# Patient Record
Sex: Female | Born: 1950
Health system: Southern US, Community
[De-identification: ages and names within clinical notes are randomized; demographics above are authoritative.]

## PROBLEM LIST (undated history)

## (undated) DIAGNOSIS — Z9981 Dependence on supplemental oxygen: Secondary | ICD-10-CM

## (undated) DIAGNOSIS — M7120 Synovial cyst of popliteal space [Baker], unspecified knee: Secondary | ICD-10-CM

## (undated) DIAGNOSIS — R319 Hematuria, unspecified: Principal | ICD-10-CM

## (undated) DIAGNOSIS — F419 Anxiety disorder, unspecified: Secondary | ICD-10-CM

## (undated) DIAGNOSIS — N939 Abnormal uterine and vaginal bleeding, unspecified: Secondary | ICD-10-CM

## (undated) DIAGNOSIS — I509 Heart failure, unspecified: Secondary | ICD-10-CM

## (undated) DIAGNOSIS — J449 Chronic obstructive pulmonary disease, unspecified: Secondary | ICD-10-CM

## (undated) DIAGNOSIS — I10 Essential (primary) hypertension: Secondary | ICD-10-CM

## (undated) DIAGNOSIS — R011 Cardiac murmur, unspecified: Secondary | ICD-10-CM

## (undated) DIAGNOSIS — J189 Pneumonia, unspecified organism: Secondary | ICD-10-CM

## (undated) DIAGNOSIS — R3915 Urgency of urination: Secondary | ICD-10-CM

## (undated) DIAGNOSIS — F17200 Nicotine dependence, unspecified, uncomplicated: Secondary | ICD-10-CM

## (undated) DIAGNOSIS — N952 Postmenopausal atrophic vaginitis: Secondary | ICD-10-CM

## (undated) HISTORY — PX: ABDOMINAL HYSTERECTOMY: SHX81

## (undated) HISTORY — DX: Postmenopausal atrophic vaginitis: N95.2

## (undated) HISTORY — DX: Hematuria, unspecified: R31.9

## (undated) HISTORY — DX: Abnormal uterine and vaginal bleeding, unspecified: N93.9

## (undated) HISTORY — DX: Nicotine dependence, unspecified, uncomplicated: F17.200

## (undated) HISTORY — DX: Urgency of urination: R39.15

## (undated) HISTORY — DX: Synovial cyst of popliteal space (Baker), unspecified knee: M71.20

---

## 2001-04-03 ENCOUNTER — Other Ambulatory Visit: Admission: RE | Admit: 2001-04-03 | Discharge: 2001-04-03 | Payer: Self-pay | Admitting: Obstetrics and Gynecology

## 2002-04-03 ENCOUNTER — Encounter: Payer: Self-pay | Admitting: Emergency Medicine

## 2002-04-03 ENCOUNTER — Inpatient Hospital Stay (HOSPITAL_COMMUNITY): Admission: EM | Admit: 2002-04-03 | Discharge: 2002-04-08 | Payer: Self-pay | Admitting: Emergency Medicine

## 2002-04-07 ENCOUNTER — Encounter: Payer: Self-pay | Admitting: Internal Medicine

## 2003-10-07 ENCOUNTER — Inpatient Hospital Stay (HOSPITAL_COMMUNITY): Admission: EM | Admit: 2003-10-07 | Discharge: 2003-10-15 | Payer: Self-pay | Admitting: Emergency Medicine

## 2003-10-07 ENCOUNTER — Encounter: Payer: Self-pay | Admitting: Emergency Medicine

## 2003-10-11 ENCOUNTER — Encounter: Payer: Self-pay | Admitting: Internal Medicine

## 2003-10-13 ENCOUNTER — Encounter: Payer: Self-pay | Admitting: Internal Medicine

## 2004-03-29 ENCOUNTER — Ambulatory Visit (HOSPITAL_COMMUNITY): Admission: RE | Admit: 2004-03-29 | Discharge: 2004-03-29 | Payer: Self-pay | Admitting: Internal Medicine

## 2004-03-30 ENCOUNTER — Encounter (HOSPITAL_COMMUNITY): Admission: RE | Admit: 2004-03-30 | Discharge: 2004-04-29 | Payer: Self-pay | Admitting: Internal Medicine

## 2004-04-13 ENCOUNTER — Ambulatory Visit (HOSPITAL_COMMUNITY): Admission: RE | Admit: 2004-04-13 | Discharge: 2004-04-13 | Payer: Self-pay | Admitting: Internal Medicine

## 2004-04-30 ENCOUNTER — Encounter (HOSPITAL_COMMUNITY): Admission: RE | Admit: 2004-04-30 | Discharge: 2004-05-30 | Payer: Self-pay | Admitting: Internal Medicine

## 2004-05-31 ENCOUNTER — Encounter (HOSPITAL_COMMUNITY): Admission: RE | Admit: 2004-05-31 | Discharge: 2004-06-30 | Payer: Self-pay | Admitting: Internal Medicine

## 2004-06-15 ENCOUNTER — Ambulatory Visit (HOSPITAL_COMMUNITY): Admission: RE | Admit: 2004-06-15 | Discharge: 2004-06-15 | Payer: Self-pay | Admitting: Neurology

## 2004-07-02 ENCOUNTER — Encounter (HOSPITAL_COMMUNITY): Admission: RE | Admit: 2004-07-02 | Discharge: 2004-08-01 | Payer: Self-pay | Admitting: Internal Medicine

## 2004-09-07 ENCOUNTER — Encounter (HOSPITAL_COMMUNITY): Admission: RE | Admit: 2004-09-07 | Discharge: 2004-09-24 | Payer: Self-pay | Admitting: Internal Medicine

## 2004-09-07 ENCOUNTER — Ambulatory Visit (HOSPITAL_COMMUNITY): Admission: RE | Admit: 2004-09-07 | Discharge: 2004-09-07 | Payer: Self-pay | Admitting: Internal Medicine

## 2004-10-11 ENCOUNTER — Encounter (HOSPITAL_COMMUNITY): Admission: RE | Admit: 2004-10-11 | Discharge: 2004-11-10 | Payer: Self-pay | Admitting: *Deleted

## 2004-10-19 ENCOUNTER — Ambulatory Visit (HOSPITAL_COMMUNITY): Admission: RE | Admit: 2004-10-19 | Discharge: 2004-10-19 | Payer: Self-pay | Admitting: Internal Medicine

## 2004-10-19 HISTORY — PX: COLONOSCOPY: SHX174

## 2004-11-17 ENCOUNTER — Encounter (HOSPITAL_COMMUNITY): Admission: RE | Admit: 2004-11-17 | Discharge: 2004-12-17 | Payer: Self-pay | Admitting: Internal Medicine

## 2005-05-09 ENCOUNTER — Ambulatory Visit (HOSPITAL_COMMUNITY): Admission: RE | Admit: 2005-05-09 | Discharge: 2005-05-09 | Payer: Self-pay | Admitting: Internal Medicine

## 2005-05-09 ENCOUNTER — Ambulatory Visit (HOSPITAL_COMMUNITY): Admission: RE | Admit: 2005-05-09 | Discharge: 2005-05-09 | Payer: Self-pay | Admitting: Obstetrics and Gynecology

## 2005-09-17 ENCOUNTER — Inpatient Hospital Stay (HOSPITAL_COMMUNITY): Admission: EM | Admit: 2005-09-17 | Discharge: 2005-09-22 | Payer: Self-pay | Admitting: Emergency Medicine

## 2005-10-25 ENCOUNTER — Ambulatory Visit (HOSPITAL_COMMUNITY): Admission: RE | Admit: 2005-10-25 | Discharge: 2005-10-25 | Payer: Self-pay | Admitting: Pulmonary Disease

## 2005-11-14 ENCOUNTER — Ambulatory Visit (HOSPITAL_COMMUNITY): Admission: RE | Admit: 2005-11-14 | Discharge: 2005-11-14 | Payer: Self-pay | Admitting: Family Medicine

## 2006-03-27 ENCOUNTER — Ambulatory Visit (HOSPITAL_COMMUNITY): Admission: RE | Admit: 2006-03-27 | Discharge: 2006-03-27 | Payer: Self-pay | Admitting: Internal Medicine

## 2006-05-11 ENCOUNTER — Ambulatory Visit (HOSPITAL_COMMUNITY): Admission: RE | Admit: 2006-05-11 | Discharge: 2006-05-11 | Payer: Self-pay | Admitting: Internal Medicine

## 2006-09-13 ENCOUNTER — Ambulatory Visit (HOSPITAL_COMMUNITY): Admission: RE | Admit: 2006-09-13 | Discharge: 2006-09-13 | Payer: Self-pay | Admitting: Internal Medicine

## 2007-09-05 ENCOUNTER — Ambulatory Visit (HOSPITAL_COMMUNITY): Admission: RE | Admit: 2007-09-05 | Discharge: 2007-09-05 | Payer: Self-pay | Admitting: Internal Medicine

## 2008-07-14 ENCOUNTER — Other Ambulatory Visit: Admission: RE | Admit: 2008-07-14 | Discharge: 2008-07-14 | Payer: Self-pay | Admitting: Obstetrics and Gynecology

## 2009-11-17 ENCOUNTER — Ambulatory Visit (HOSPITAL_COMMUNITY): Admission: RE | Admit: 2009-11-17 | Discharge: 2009-11-17 | Payer: Self-pay | Admitting: Internal Medicine

## 2011-01-16 ENCOUNTER — Encounter: Payer: Self-pay | Admitting: Neurology

## 2011-03-14 ENCOUNTER — Other Ambulatory Visit (HOSPITAL_COMMUNITY): Payer: Self-pay | Admitting: Internal Medicine

## 2011-03-14 DIAGNOSIS — Z139 Encounter for screening, unspecified: Secondary | ICD-10-CM

## 2011-03-15 ENCOUNTER — Ambulatory Visit (HOSPITAL_COMMUNITY): Payer: Self-pay

## 2011-05-10 ENCOUNTER — Other Ambulatory Visit (HOSPITAL_COMMUNITY): Payer: Self-pay | Admitting: Internal Medicine

## 2011-05-10 DIAGNOSIS — Z139 Encounter for screening, unspecified: Secondary | ICD-10-CM

## 2011-05-13 NOTE — H&P (Signed)
[name], [name]              ACCOUNT NO.:  [id number]   MEDICAL RECORD NO.:  [id number]          PATIENT TYPE:  INP   LOCATION:  IC07                          FACILITY:  [facility]   PHYSICIAN:  Angus G. Renard Matter, MD   DATE OF BIRTH:  07/17/1951   DATE OF ADMISSION:  09/17/2005  DATE OF DISCHARGE:  LH                                HISTORY & PHYSICAL   HISTORY OF PRESENT ILLNESS:  A 60 year old white female presented herself to  the emergency room with her chief complaint being shortness of breath for  several days duration.  The patient had been coughing some as well and had  some rhinorrhea, nausea, and vomiting, low-grade fever.  She was evaluated  by ED physician.  X-ray of her chest showed right lower lobe infiltrate,  compatible with pneumonia.   LABORATORY DATA:  The patient's blood gas is a pH of 7.08, PCO2 of 103, PO2  of 84.2.  Electrocardiogram shows normal sinus rhythm with a rate of 99, LVH  with ST abnormalities.  CBC:  WBC 24,700, hemoglobin 16.4, hematocrit 50.8.  Chemistries:  Sodium 135, potassium 4.5, chloride 90, CO2 of 37, glucose  117, BUN 24, creatinine 1.2, calcium 8.3.  Cardiac markers:  CPK-MB 1.8,  troponin-I of less than 0.05.  BNP 979.   The patient was started on BiPAP in the ED and was held in the ED pending  possible intubation.   SOCIAL HISTORY:  The patient has been a chronic cigarette smoker, does not  use alcohol.   FAMILY HISTORY:  Positive for coronary artery disease, hypertension.   PAST MEDICAL HISTORY:  1.  History of COPD.  2.  Previous history of pneumonia.  3.  Pneumothorax.  4.  Bronchitis.  5.  Congestive heart failure.   REVIEW OF SYSTEMS:  HEENT:  Negative.  CARDIOPULMONARY:  The patient has  been short of breath and has had cough for the past few days.  GASTROINTESTINAL:  Bouts of nausea and vomiting.  No diarrhea.  GENITOURINARY:  No dysuria or hematuria.   PHYSICAL EXAMINATION:  GENERAL:  Alert female.  VITAL SIGNS:  Blood  pressure 128/93, respirations 24, pulse 98.  HEENT:  Eyes:  PERRLA.  TM's negative.  Oropharynx benign.  NECK:  Supple, no JVD or thyroid abnormalities.  LUNGS:  Rhonchi over lower lung field, decreased air movement, and  diminished breath sounds.  Mild respiratory distress.  HEART:  Regular rhythm, no murmurs.  ABDOMEN:  No palpable organs or masses.  SKIN:  Warm and dry.  EXTREMITIES:  Free of edema.  NEUROLOGIC:  No focal deficit.   DIAGNOSES:  1.  Chronic obstructive pulmonary disease and exacerbation.  2.  Acute respiratory failure.  3.  Bronchopneumonia, acute.  4.  Hypercapnia.      Angus G. Renard Matter, MD  Electronically Signed     AGM/MEDQ  D:  09/17/2005  T:  09/18/2005  Job:  [id number]

## 2011-05-13 NOTE — H&P (Signed)
[name], [name]              ACCOUNT NO.:  [id number]   MEDICAL RECORD NO.:  [id number]          PATIENT TYPE:  INP   LOCATION:  IC07                          FACILITY:  [facility]   PHYSICIAN:  Angus G. Renard Matter, MD   DATE OF BIRTH:  [birth year]   DATE OF ADMISSION:  09/17/2005  DATE OF DISCHARGE:  LH                                HISTORY & PHYSICAL   ADDENDUM:   MEDICATIONS:  1.  Albuterol p.r.n.  2.  Xanax 0.5 mg q.i.d.   ALLERGIES:  No known drug allergies.      Angus G. Renard Matter, MD  Electronically Signed     AGM/MEDQ  D:  09/17/2005  T:  09/18/2005  Job:  [id number]

## 2011-05-13 NOTE — H&P (Signed)
[facility]  Patient:    [name], [name] Visit Number: [id number] MRN: [id number]          Service Type: MED Location: 2A A201 01 Attending Physician:  [name] Dictated by:   [name], M.D. Proc. Date: 04/03/02 Admit Date:  04/03/2002                           History and Physical  PHYSICIAN:  [name], M.D.  REASON FOR ADMISSION:  Pneumonia.  HISTORY: [name] is a 60 year old patient of Dr. Ouida Hanson, who said that she got sick about two to three days ago with headache, cough, fever, chills, and shortness of breath.  She got much worse today, and became so short of breath that she could not get from room to room in her house, called my office and I told her to come to the emergency room because she has a history of pneumonia in the past.  When she came into the emergency room she was noted to be markedly short of breath and appeared to be in some distress.  Her respiratory rate was in the 20s.  She had an O2 saturation of 78% on room air, and then had a temperature of 102 degrees.  She says that she has had multiple bouts of similar problems.  She is coughing but not able to cough anything up.  She has had no nausea or vomiting and not had any actual chills, but has had fever. She has had a severe headache.  PAST MEDICAL HISTORY:  1. Positive for problems with anxiety.  She has taken Xanax, Zoloft, and     estrogen.  She does not know the doses of any of these at this time.  2. She has had at least two episodes, she says, of pneumonia.  PAST SURGICAL HISTORY:  Hysterectomy.  SOCIAL HISTORY:  She had stopped smoking after her last episode of pneumonia, but then restarted about three months ago.  She does not drink any alcohol.  FAMILY HISTORY:  She says is positive for multiple family members with lung problems.  REVIEW OF SYSTEMS:  Except as mentioned, negative.  PHYSICAL EXAMINATION:  GENERAL:  Well-developed,  well-nourished, thin female who appears to be in moderate respiratory distress even now.  VITAL SIGNS:  On admission to the emergency room her temperature was 99.3 degrees, pulse 105, respirations 24, blood pressure 144/76.  O2 saturation 78%.  HEENT:  Mucous membranes were quite dry.  PERRL.  Tympanic membranes intact. Nose and throat clear, without any sinus tenderness.  CHEST:  Marked bilateral rhonchi.  She was using accessory muscles of respiration.  CARDIAC:  Heart sounds were very obscured by airway noise.  I really could not hear anything other than S1 and S2.  ABDOMEN:  Soft, without masses.  EXTREMITIES:  No edema.  CNS:  Grossly intact.  LABORATORY DATA:  Chest x-ray shows what appears to be a right lower lobe pneumonia and pleural effusion.  Her WBC is not particularly high at 8700, but her pO2 is 67, pCO2 is 66, pH is 7.36.  ASSESSMENT:  This is a 60 year old who has pneumonia, probably chronic obstructive pulmonary disease as well considering the elevated pCO2 with a fairly normal pH.  PLAN:  1. Admit.  2. Replace oxygen.  3. Give IV fluids.  4. Antibiotics.  5. Inhaled bronchodilators. Dictated by:   [name], M.D. Attending Physician:  [name] DD:  04/03/02 TD:  04/04/02 Job: [id number]

## 2011-05-13 NOTE — Group Therapy Note (Signed)
NAMEEPSIE, WALTHALL              ACCOUNT NO.:  [id number]   MEDICAL RECORD NO.:  [id number]          PATIENT TYPE:  INP   LOCATION:  A318                          FACILITY:  [facility]   PHYSICIAN:  Edward L. Juanetta Gosling, M.D.DATE OF BIRTH:  09-23-1951   DATE OF PROCEDURE:  DATE OF DISCHARGE:  09/22/2005                                   PROGRESS NOTE   Mrs. Lesko is doing much better. She is set for discharge today. She has no  other new complaints. Has a number of questions, and I have explained to her  that she needs to wear her oxygen at least 16 hours a day to get the benefit  of it.   Her exam shows that she is awake and alert. Temperature is 99.1, pulse 93,  respirations 20, blood pressure 142/75.  O2 sats 95% on 3 liters.  Her chest  shows minimal wheezes. Heart is regular.   ASSESSMENT:  She has chronic obstructive pulmonary disease with severe  exacerbation requiring intubation and mechanical ventilation. She is overall  much improved, and I think it is appropriate to send her home.      Edward L. Juanetta Gosling, M.D.  Electronically Signed     ELH/MEDQ  D:  09/22/2005  T:  09/22/2005  Job:  [id number]

## 2011-05-13 NOTE — Group Therapy Note (Signed)
NAMEISABELLAMARIE, RANDA              ACCOUNT NO.:  [id number]   MEDICAL RECORD NO.:  [id number]          PATIENT TYPE:  INP   LOCATION:  A218                          FACILITY:  [facility]   PHYSICIAN:  Edward L. Juanetta Gosling, M.D.DATE OF BIRTH:  [birth year]   DATE OF PROCEDURE:  DATE OF DISCHARGE:                                   PROGRESS NOTE   PROBLEM:  COPD with exacerbation. She is a patient of Dr. Ouida Sills.   SUBJECTIVE:  Ms. Sciarra says she is feeling better. She has no new complaints  and Dr. Ouida Sills plans to discharge her home. She says she is afraid that she  may fall.  I have asked for PT and see her.   PHYSICAL EXAMINATION:  VITAL SIGNS:  Temperature is 99.4, pulse 95,  respirations 19, blood pressure 122/74, O2 sat is 94% on 3 liters.  CHEST:  Clear.   ASSESSMENT:  She seems to be doing better. We will discuss with the case  manager whether she might need some home health services.  Continue her  other treatments and follow.      Edward L. Juanetta Gosling, M.D.  Electronically Signed     ELH/MEDQ  D:  09/21/2005  T:  09/21/2005  Job:  [id number]

## 2011-05-13 NOTE — Consult Note (Signed)
[name], RICKERSON              ACCOUNT NO.:  [id number]   MEDICAL RECORD NO.:  [id number]          PATIENT TYPE:  INP   LOCATION:  IC07                          FACILITY:  [facility]   PHYSICIAN:  Edward L. Juanetta Gosling, M.D.DATE OF BIRTH:  1951/05/09   DATE OF CONSULTATION:  09/19/2005  DATE OF DISCHARGE:                                   CONSULTATION   HISTORY OF PRESENT ILLNESS:  Ms. [name] is a 60 year old woman with COPD who  came to the emergency room on September 23 with a several day history of  shortness of breath.  When she was seen in the emergency room, she was noted  to have a right lower lobe infiltrate consistent with pneumonia.  Her blood  gas in the emergency room showed a pH of 7.08, PCO2 103, PO2 84.  She was  intubated, placed on mechanical ventilation.  This morning, she is sedated,  but seems to have generally improved.   PAST MEDICAL HISTORY:  1.  COPD.  2.  Pneumonia in the past.  3.  Pneumothorax in the past.  4.  Multiple bouts of bronchitis.  5.  Congestive heart failure.   SOCIAL HISTORY:  Many pack years of smoking.  Denied any use of alcohol.  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   FAMILY HISTORY:  Hypertension and heart disease, but I do not have all the  details of that.   MEDICATIONS:  I do not have a list of medications on admission.   REVIEW OF SYSTEMS:  Unobtainable.   PHYSICAL EXAMINATION:  GENERAL APPEARANCE:  She is sedated, intubated.  VITAL SIGNS:  Pulse 85, blood pressure 119/72, O2 saturation 92%,  respirations 12 on the ventilator.  HEENT:  Pupils equal, round and reactive to light and accommodation.  Nose  and throat are clear.  NECK:  Supple with no masses.  No jugular venous distention.  CHEST:  Fairly clear without wheezes, but she does have some rhonchi.  HEART:  Regular.  ABDOMEN:  Soft.  CNS:  She is sedated.  Really cannot make any other findings from that  except that she has been said to have no focal defect.   LABORATORY DATA:   B-met shows potassium 3, sodium 141, glucose 107, BUN 5,  creatinine 0.6.  CBC shows white count 14,800, hemoglobin 15.4, platelets  173,000.  Blood gas shows PO2 86, PCO2 39, pH 7.51.  Chest x-ray this  morning shows somewhat hyperinflated lungs, still with right lower lobe  pneumonia and perhaps effusion.  The endotracheal tube is in good position.   ASSESSMENT:  She is better.   PLAN:  See if she can wean today.  She needs to have potassium replacement,  and if she is unable to be weaned today, she will need to have a feeding  tube placed.  Thank you for allowing me to see her with you.      Edward L. Juanetta Gosling, M.D.  Electronically Signed     ELH/MEDQ  D:  09/19/2005  T:  09/19/2005  Job:  [id number]

## 2011-05-13 NOTE — Procedures (Signed)
[name], [name]              ACCOUNT NO.:  [id number]   MEDICAL RECORD NO.:  [id number]          PATIENT TYPE:  OUT   LOCATION:  RAD                           FACILITY:  [facility]   PHYSICIAN:  Edward L. Juanetta Gosling, M.D.DATE OF BIRTH:  1951/07/07   DATE OF PROCEDURE:  10/25/2005  DATE OF DISCHARGE:                              PULMONARY FUNCTION TEST   1.  Spirometry shows a severe ventilatory defect with airflow obstruction.  2.  Lung volumes show no evidence of restrictive change.  3.  DLCO is moderately reduced.  4.  Arterial blood gases show normal pH, elevated pCO2 and relative resting      hypoxemia. This is suggestive of chronic elevated CO2.  5.  There is no significant bronchodilator effect.  6.  This is consistent with a clinical diagnosis of emphysema.      Edward L. Juanetta Gosling, M.D.  Electronically Signed     ELH/MEDQ  D:  10/25/2005  T:  10/26/2005  Job:  [id number]   cc:   Kingsley Callander. Ouida Sills, MD  Fax: [phone]

## 2011-05-13 NOTE — Group Therapy Note (Signed)
[name], [name]              ACCOUNT NO.:  [id number]   MEDICAL RECORD NO.:  [id number]          PATIENT TYPE:  INP   LOCATION:  A218                          FACILITY:  [facility]   PHYSICIAN:  Edward L. Juanetta Gosling, M.D.DATE OF BIRTH:  1951/10/01   DATE OF PROCEDURE:  DATE OF DISCHARGE:                                   PROGRESS NOTE   PROBLEM:  Respiratory failure.   SUBJECTIVE:  [name] says she is doing much better.  She was able to be  extubated yesterday.  This morning she feels okay.  She has no new  complaints.  She is not short of breath.  She wants to get up.  She wants to  move out of the intensive care unit.  She is hoping to be able to eat.  Her  physical exam shows that she is alert.  She is oriented.  Her chest is much  clearer.  She has actually got decreased breath sounds.  No rhonchi now.  Her heart is regular without gallop.  Her abdomen is soft.  She does not  have any edema.  Her lab work shows pH 7.32, pCO2 of 66, pO2 of 74, which I  suspect is about baseline for her, perhaps a little higher CO2 than usual,  and her BMET shows a potassium of 3.8, sodium 138, chloride 101, CO2 31,  glucose 100, BUN 3, creatinine 0.5, calcium 7.5.  Her chest x-ray is  unchanged.   ASSESSMENT:  She is much improved.   PLAN:  To have her continue treatments.  Dr. Ouida Sills is planning to move her  from the intensive care unit, which I think is appropriate.      Edward L. Juanetta Gosling, M.D.  Electronically Signed     ELH/MEDQ  D:  09/20/2005  T:  09/20/2005  Job:  [id number]

## 2011-05-13 NOTE — Op Note (Signed)
NAME:  [name], [name]              ACCOUNT NO.:  [id number]   MEDICAL RECORD NO.:  [id number]          PATIENT TYPE:  AMB   LOCATION:  DAY                           FACILITY:  [facility]   PHYSICIAN:  R. Roetta Sessions, M.D. DATE OF BIRTH:  04-10-1951   DATE OF PROCEDURE:  10/19/2004  DATE OF DISCHARGE:                                 OPERATIVE REPORT   PROCEDURE:  Colonoscopy.   INDICATIONS FOR PROCEDURE:  The patient is a 60 year old lady sent over at  the courtesy of Dr. Carylon Perches for colorectal cancer screening. She has no  lower GI tract symptoms. No family history of colorectal neoplasia. She has  never had a screening colonoscopy or other imaging of her large bowel.  Colonoscopy is now being done as a screening maneuver. This approach has  been discussed with the patient at length. Potential risks, benefits, and  alternatives have been reviewed and questions answered. Please see my  handwritten HPI for more information.   PROCEDURE NOTE:  O2 saturation, blood pressure, pulse, and respirations  monitored throughout the entirety of the procedure. Conscious sedation with  Versed 5 mg IV and Demerol 125 mg IV in divided doses.   INSTRUMENT:  Olympus video chip system.   FINDINGS:  Digital rectal examination revealed no abnormalities.   ENDOSCOPIC FINDINGS:  Prep was good.   Rectum:  Examination of the rectal mucosa including retroflexed view of anal  verge revealed only internal hemorrhoids.   Colon:  Colonic mucosa was surveyed from the rectosigmoid junction through  the left, transverse, and right colon to area of the appendiceal orifice,  ileocecal valve, and cecum. These structures were well seen and photographed  for the record. Olympus video scope was slowly withdrawn, and all previously  mentioned mucosal surfaces were again seen. Colonic mucosa appeared normal.  The patient tolerated the procedure well and was reactive to endoscopy.   IMPRESSION:  Internal hemorrhoids,  otherwise normal rectum. Normal colon.   RECOMMENDATIONS:  Repeat colonoscopy for screening purposes in 10 years.     Otelia Sergeant   RMR/MEDQ  D:  10/19/2004  T:  10/19/2004  Job:  [id number]   cc:   Kingsley Callander. Ouida Sills, MD  [address]  [place]  [place] [address]  Fax: [phone]

## 2011-05-13 NOTE — Discharge Summary (Signed)
Jean, Hanson              ACCOUNT NO.:  [id number]   MEDICAL RECORD NO.:  [id number]          PATIENT TYPE:  INP   LOCATION:  A218                          FACILITY:  [facility]   PHYSICIAN:  Kingsley Callander. Ouida Sills, MD       DATE OF BIRTH:  02/12/1951   DATE OF ADMISSION:  09/17/2005  DATE OF DISCHARGE:  LH                                 DISCHARGE SUMMARY   DISCHARGE DIAGNOSES:  1.  Respiratory failure.  2.  Chronic obstructive pulmonary disease.  3.  Right lower lobe pneumonia.  4.  Hypokalemia.   HOSPITAL COURSE:  This patient is a 60 year old female who presented with  difficulty breathing. Her chest x-ray revealed a right lower lobe infiltrate  consistent with pneumonia. Her white count was 24.7. Her ABG initially  revealed a pH of 7.2, pCO2 103 and a pO2 of 84 with a bicarb of 39. She had  progressive difficulty breathing and required intubation. She was treated in  the intensive care unit with mechanical ventilation. She was treated with IV  Levaquin. She required sedation with Diprivan initially.   She gradually improved. She was able to be extubated on September 25. She  was seen in pulmonary consultation by Dr. Juanetta Gosling who facilitated her  extubation. She was continued on Levaquin. She was transferred to a regular  room and was able to ambulate satisfactorily. Her oxygenation was well  maintained with an O2 saturation of 94% on 3 L. She was felt to be stable  for discharge on September 27. She will be seen in followup in the office in  approximately 10 days. She will finish her course of oral Levaquin.   DISCHARGE MEDICATIONS:  1.  Levaquin 750 mg daily.  2.  DuoNeb q.i.d.  3.  Ativan 1 mg q.i.d. p.r.n.  4.  Oxygen 2 L by nasal cannula.      Kingsley Callander. Ouida Sills, MD  Electronically Signed     ROF/MEDQ  D:  09/21/2005  T:  09/21/2005  Job:  [id number]

## 2011-05-13 NOTE — Group Therapy Note (Signed)
[name], [name]              ACCOUNT NO.:  [id number]   MEDICAL RECORD NO.:  [id number]          PATIENT TYPE:  INP   LOCATION:  IC07                          FACILITY:  [facility]   PHYSICIAN:  Angus G. Renard Matter, MD   DATE OF BIRTH:  05-18-1951   DATE OF PROCEDURE:  09/18/2005  DATE OF DISCHARGE:                                   PROGRESS NOTE   SUBJECTIVE:  This patient was intubated in ED.  She presented there with  pneumonia, respiratory distress, hypercapnia.  She is currently on  ventilator settings of tidal volume 600, 40% oxygen.  The patient's  condition is stable.   OBJECTIVE:  VITAL SIGNS:  Temperature 102.6, blood pressure 142/94, pulse  rate 98, respirations 24.  LUNGS:  Rhonchi over the lower lung field.  HEART:  Regular rhythm.  ABDOMEN:  No palpable organs or masses.   ASSESSMENT:  The patient was admitted with pneumonia, respiratory distress  with hypercapnia.   PLAN:  Continue respiratory therapy today.  Continue IV antibiotics.  Continue albuterol nebulizer treatments.  The patient's current blood gas is  pH of 7.54, pCO2 41, pO2 65.9, bicarb 36.      Angus G. Renard Matter, MD  Electronically Signed     AGM/MEDQ  D:  09/18/2005  T:  09/19/2005  Job:  [id number]

## 2011-05-13 NOTE — Discharge Summary (Signed)
[facility]  Patient:    [name], [name] Visit Number: [id number] MRN: [id number]          Service Type: MED Location: 2A A201 01 Attending Physician:  Carylon Perches Dictated by:   Carylon Perches, M.D. Admit Date:  04/03/2002 Discharge Date: 04/08/2002                             Discharge Summary  DISCHARGE DIAGNOSES: 1. Pneumonia. 2. Chronic obstructive pulmonary disease. 3. Anxiety. 4. Postmenopausal.  HOSPITAL COURSE:  This patient is a 60 year old white female who presented with cough, fever, and shortness of breath.  The chest x-ray revealed a right lower lobe infiltrate and pleural effusion.  Her white count was 8.7.  ABGs revealed a pH of 7.32, a pCO2 of 62, and a pO2 of 65 on 2 L.  On 4 L, her ABG had revealed a pH of 7.42, a pCO2 of 56, and a pO2 of 74.  She was treated with Rocephin, Zithromax, albuterol and Atrovent nebulizer treatments, and supplemental oxygen.  She was wheezing initially.  Breath sounds were markedly diminished.  She was treated with IV Decadron which was modified to oral prednisone on April 07, 2002.  A repeat chest x-ray revealed improvement in the aeration of the right base.  A repeat ABG revealed a pH of 7.41, a pCO2 of 56, and a pO2 of 61 on 4 L.  A room air blood gas is pending.  She is being discharged on oxygen and home nebulizers.  She will be kept out of work until she follows up in the office in two weeks.  She has been encouraged to discontinue all tobacco use.  DISCHARGE MEDICATIONS: 1. Duoneb q.i.d. 2. Omnicef 300 mg b.i.d. for seven days. 3. Prednisone 10 mg ______ for four days. 4. Oxygen 2 L nasal cannula. 5. Premarin 0.625 mg q.d. 6. Zoloft 50 mg q.d. 7. Xanax 0.5 mg q.i.d. p.r.n. Dictated by:   Carylon Perches, M.D. Attending Physician:  Carylon Perches DD:  04/08/02 TD:  04/08/02 Job: [id number]

## 2011-05-13 NOTE — Discharge Summary (Signed)
[name], [name]                          ACCOUNT NO.:  [id number]   MEDICAL RECORD NO.:  [id number]                   PATIENT TYPE:  INP   LOCATION:  A327                                 FACILITY:  [facility]   PHYSICIAN:  Kingsley Callander. Ouida Sills, M.D.                  DATE OF BIRTH:  09-Jun-1951   DATE OF ADMISSION:  10/07/2003  DATE OF DISCHARGE:  10/15/2003                                 DISCHARGE SUMMARY   DISCHARGE DIAGNOSES:  1. Right lower lobe pneumonia (community-acquired).  2. Chronic obstructive pulmonary disease.  3. Right pleural effusion.  4. Anxiety.   PROCEDURES:  Ultrasound-guided thoracentesis.   HOSPITAL COURSE:  This patient is a 60 year old white female with COPD who  presented with increasing difficulty breathing with shortness of breath,  wheezing, and cough.  Her chest x-ray revealed a right lower lobe infiltrate  and right pleural effusion.  She was afebrile and had a white count of 8.6.  Her ABG revealed a pH of 7.37, pCO2 63, and a pO2 of 35 on room air.   She was hospitalized and treated with IV Levaquin, Solu-Medrol, oxygen, and  Ventolin and Atrovent nebulizer treatments.  Her wheezing was slow to  improve.  A repeat chest x-ray revealed a stable effusion.  She underwent an  ultrasound-guided thoracentesis, and 720 mL of clear yellow fluid was  withdrawn.  The white count on the pleural fluid was 78, with 7% neutrophils  and 27% lymphocytes and 64% monocytes, 2% eosinophils.  The glucose was 93,  the protein was 1.  The culture of the fluid has been negative.  Cytology  revealed no malignancy.   Blood cultures were negative.   IMPRESSION:  Her breathing improved following thoracentesis.  There was no  evidence of pneumothorax.  Her overall condition was improved to the point  that she was stable for discharge on October 15, 2003.  She will need to  continue home oxygen and home nebulizer treatments.   She will be seen in follow-up in my office in two  weeks.   DISCHARGE MEDICATIONS:  1. Oxygen 3 L by nasal cannula.  2. DuoNeb q.i.d.  3. Levaquin 500 mg daily for five days.  4. Prednisone 10 mg, 4 daily for four days; 3 daily for four days; 2 daily     for four days; 1 daily for four days; 1/2 daily for four days.  5.     Xanax 1 mg three times daily.  6. Premarin 0.625 mg daily.  7. Zoloft 50 mg daily.     ___________________________________________                                         Kingsley Callander. Ouida Sills, M.D.   ROF/MEDQ  D:  10/15/2003  T:  10/15/2003  Job:  [id number]  

## 2011-05-13 NOTE — H&P (Signed)
[name], [name]                          ACCOUNT NO.:  [id number]   MEDICAL RECORD NO.:  [id number]                   PATIENT TYPE:  INP   LOCATION:  A327                                 FACILITY:  [facility]   PHYSICIAN:  Kingsley Callander. Ouida Sills, M.D.                  DATE OF BIRTH:  Jan 14, 1951   DATE OF ADMISSION:  10/07/2003  DATE OF DISCHARGE:                                HISTORY & PHYSICAL   CHIEF COMPLAINT:  Difficulty breathing.   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  She had shortness of breath, coughing, and wheezing which was  worsening over the past week.  She coughed up minimal sputum.  She denied  fever.  She is a smoker.  She was last hospitalization in April 2003 with  pneumonia and COPD.  Arterial blood gases have previously been poor and she  had required oxygen.  She has unfortunately continued to smoke.   PAST MEDICAL HISTORY:  1. COPD.  2. Pneumonia.  3. Anxiety.  4. Postmenopausal.  5. Status post hysterectomy.   MEDICATIONS:  1. Zoloft 50 mg daily.  2. Xanax 0.5 q.i.d. p.r.n.  3. Premarin 0.625 mg daily.  4. Home nebulizers p.r.n.   ALLERGIES:  None.   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  She smokes a pack of cigarettes per day.  She does not abuse alcohol or recreational drugs.   FAMILY HISTORY:  She grew up in a foster home.   REVIEW OF SYSTEMS:  Noncontributory.   PHYSICAL EXAMINATION:  VITAL SIGNS:  Temperature 97.5, blood pressure  134/76, pulse 86, respirations 18, weight 140.  GENERAL:  Dyspneic white female.  HEENT:  She has a strabismus.  There is no scleral icterus.  The pharynx is  unremarkable.  NECK:  Supple with no JVD or thyromegaly.  LUNGS:  Distant breath sounds with bilateral wheezes.  HEART:  Regular with no murmurs.  ABDOMEN:  Nontender with no hepatosplenomegaly.  EXTREMITIES:  No cyanosis, clubbing, or edema.  NEUROLOGIC:  Grossly intact.   LABORATORY DATA:  White count 8.6, hemoglobin 15.1, platelets 255.  Sodium  135, potassium 3.6, glucose 130, BUN 11, creatinine 0.6, total protein 5.4,  albumin 2.6, ALT 30.  Urinalysis negative.  Chest x-ray reveals a right  lower lobe infiltrate with pleural effusion.   IMPRESSION:  Chronic obstructive pulmonary disease and pneumonia.  She will  require hospitalization for inpatient treatment with antibiotics, IV Solu-  Medrol, supplemental oxygen, and Ventolin and Atrovent nebulizer treatments.  Her initial ABG reveals a pH of 7.37, PCO2 63, and a PO2 of 35 on room air.     ___________________________________________  Kingsley Callander. Ouida Sills, M.D.   ROF/MEDQ  D:  10/09/2003  T:  10/09/2003  Job:  [id number]

## 2011-05-16 ENCOUNTER — Ambulatory Visit (HOSPITAL_COMMUNITY)
Admission: RE | Admit: 2011-05-16 | Discharge: 2011-05-16 | Disposition: A | Discharge status: Home / Self Care | Payer: Medicare Other | Source: Ambulatory Visit | Attending: Internal Medicine | Admitting: Internal Medicine

## 2011-05-16 DIAGNOSIS — Z1231 Encounter for screening mammogram for malignant neoplasm of breast: Secondary | ICD-10-CM | POA: Insufficient documentation

## 2011-05-16 DIAGNOSIS — Z139 Encounter for screening, unspecified: Secondary | ICD-10-CM

## 2011-08-17 ENCOUNTER — Inpatient Hospital Stay (HOSPITAL_COMMUNITY)
Admission: EM | Admit: 2011-08-17 | Discharge: 2011-08-20 | DRG: 192 | Disposition: A | Discharge status: Home / Self Care | Payer: Medicare Other | Attending: Internal Medicine | Admitting: Internal Medicine

## 2011-08-17 ENCOUNTER — Encounter: Payer: Self-pay | Admitting: Emergency Medicine

## 2011-08-17 ENCOUNTER — Emergency Department (HOSPITAL_COMMUNITY): Payer: Medicare Other

## 2011-08-17 DIAGNOSIS — R5381 Other malaise: Secondary | ICD-10-CM

## 2011-08-17 DIAGNOSIS — F411 Generalized anxiety disorder: Secondary | ICD-10-CM | POA: Diagnosis present

## 2011-08-17 DIAGNOSIS — J441 Chronic obstructive pulmonary disease with (acute) exacerbation: Secondary | ICD-10-CM

## 2011-08-17 DIAGNOSIS — E860 Dehydration: Secondary | ICD-10-CM

## 2011-08-17 DIAGNOSIS — R5383 Other fatigue: Secondary | ICD-10-CM

## 2011-08-17 HISTORY — DX: Chronic obstructive pulmonary disease, unspecified: J44.9

## 2011-08-17 HISTORY — DX: Anxiety disorder, unspecified: F41.9

## 2011-08-17 HISTORY — DX: Heart failure, unspecified: I50.9

## 2011-08-17 HISTORY — DX: Pneumonia, unspecified organism: J18.9

## 2011-08-17 LAB — BASIC METABOLIC PANEL
BUN: 8 mg/dL (ref 6–23)
CO2: 39 mEq/L — ABNORMAL HIGH (ref 19–32)
Glucose, Bld: 86 mg/dL (ref 70–99)
Sodium: 137 mEq/L (ref 135–145)

## 2011-08-17 LAB — URINALYSIS, ROUTINE W REFLEX MICROSCOPIC
Bilirubin Urine: NEGATIVE
Leukocytes, UA: NEGATIVE
Protein, ur: NEGATIVE mg/dL
Specific Gravity, Urine: 1.015 (ref 1.005–1.030)
Urobilinogen, UA: 0.2 mg/dL (ref 0.0–1.0)
pH: 6 (ref 5.0–8.0)

## 2011-08-17 LAB — DIFFERENTIAL
Basophils Relative: 0 % (ref 0–1)
Eosinophils Absolute: 0.1 10*3/uL (ref 0.0–0.7)
Lymphocytes Relative: 23 % (ref 12–46)
Lymphs Abs: 1.8 10*3/uL (ref 0.7–4.0)
Monocytes Relative: 14 % — ABNORMAL HIGH (ref 3–12)
Neutro Abs: 4.7 10*3/uL (ref 1.7–7.7)

## 2011-08-17 LAB — CBC
Hemoglobin: 13.9 g/dL (ref 12.0–15.0)
MCH: 30.5 pg (ref 26.0–34.0)
MCHC: 31.4 g/dL (ref 30.0–36.0)
MCV: 97.1 fL (ref 78.0–100.0)

## 2011-08-17 LAB — HEPATIC FUNCTION PANEL
Albumin: 3.7 g/dL (ref 3.5–5.2)
Alkaline Phosphatase: 67 U/L (ref 39–117)
Bilirubin, Direct: 0.1 mg/dL (ref 0.0–0.3)

## 2011-08-17 MED ORDER — GUAIFENESIN ER 600 MG PO TB12
1200.0000 mg | ORAL_TABLET | Freq: Two times a day (BID) | ORAL | Status: DC
  Administered 2011-08-17 – 2011-08-20 (×5): 1200 mg via ORAL
  Filled 2011-08-17: qty 1
  Filled 2011-08-17: qty 2
  Filled 2011-08-17: qty 1
  Filled 2011-08-17: qty 2
  Filled 2011-08-17: qty 1

## 2011-08-17 MED ORDER — POLYETHYLENE GLYCOL 3350 17 G PO PACK: 17.0000 g | PACK | Freq: Every day | ORAL | Status: DC

## 2011-08-17 MED ORDER — DEXTROSE 5 % IV SOLN
1.0000 g | INTRAVENOUS | Status: DC
  Administered 2011-08-18 – 2011-08-19 (×2): 1 g via INTRAVENOUS
  Filled 2011-08-17 (×4): qty 1

## 2011-08-17 MED ORDER — SODIUM CHLORIDE 0.9 % IV SOLN
INTRAVENOUS | Status: DC
  Administered 2011-08-17 – 2011-08-20 (×7): via INTRAVENOUS

## 2011-08-17 MED ORDER — ALBUTEROL SULFATE (5 MG/ML) 0.5% IN NEBU
2.5000 mg | INHALATION_SOLUTION | RESPIRATORY_TRACT | Status: DC
  Administered 2011-08-17 – 2011-08-20 (×16): 2.5 mg via RESPIRATORY_TRACT
  Filled 2011-08-17 (×13): qty 0.5

## 2011-08-17 MED ORDER — BENZONATATE 100 MG PO CAPS
200.0000 mg | ORAL_CAPSULE | Freq: Three times a day (TID) | ORAL | Status: DC | PRN
  Administered 2011-08-18: 200 mg via ORAL
  Filled 2011-08-17: qty 2

## 2011-08-17 MED ORDER — ACETAMINOPHEN 650 MG RE SUPP: 650.0000 mg | Freq: Four times a day (QID) | RECTAL | Status: DC | PRN

## 2011-08-17 MED ORDER — SODIUM CHLORIDE 0.9 % IV BOLUS (SEPSIS)
500.0000 mL | Freq: Once | INTRAVENOUS | Status: AC
  Administered 2011-08-17: 500 mL via INTRAVENOUS

## 2011-08-17 MED ORDER — HYDROCODONE-ACETAMINOPHEN 5-325 MG PO TABS: 1.0000 | ORAL_TABLET | ORAL | Status: DC | PRN

## 2011-08-17 MED ORDER — TRAZODONE HCL 50 MG PO TABS
25.0000 mg | ORAL_TABLET | Freq: Every evening | ORAL | Status: DC | PRN
  Administered 2011-08-17 – 2011-08-19 (×3): 25 mg via ORAL
  Filled 2011-08-17 (×2): qty 1

## 2011-08-17 MED ORDER — ACETAMINOPHEN 325 MG PO TABS
650.0000 mg | ORAL_TABLET | Freq: Four times a day (QID) | ORAL | Status: DC | PRN
  Administered 2011-08-19: 650 mg via ORAL
  Filled 2011-08-17: qty 2

## 2011-08-17 MED ORDER — ALBUTEROL SULFATE (5 MG/ML) 0.5% IN NEBU
2.5000 mg | INHALATION_SOLUTION | Freq: Once | RESPIRATORY_TRACT | Status: AC
  Administered 2011-08-17: 2.5 mg via RESPIRATORY_TRACT
  Filled 2011-08-17: qty 0.5

## 2011-08-17 MED ORDER — BIOTENE DRY MOUTH MT LIQD
Freq: Two times a day (BID) | OROMUCOSAL | Status: DC
  Administered 2011-08-17 – 2011-08-20 (×6): via OROMUCOSAL

## 2011-08-17 MED ORDER — METHYLPREDNISOLONE SODIUM SUCC 125 MG IJ SOLR
125.0000 mg | Freq: Once | INTRAMUSCULAR | Status: AC
  Administered 2011-08-17: 125 mg via INTRAVENOUS
  Filled 2011-08-17: qty 2

## 2011-08-17 MED ORDER — FLUTICASONE-SALMETEROL 250-50 MCG/DOSE IN AEPB
1.0000 | INHALATION_SPRAY | Freq: Two times a day (BID) | RESPIRATORY_TRACT | Status: DC
  Administered 2011-08-17 – 2011-08-20 (×6): 1 via RESPIRATORY_TRACT
  Filled 2011-08-17: qty 14

## 2011-08-17 MED ORDER — METHYLPREDNISOLONE SODIUM SUCC 125 MG IJ SOLR
125.0000 mg | Freq: Four times a day (QID) | INTRAMUSCULAR | Status: DC
  Administered 2011-08-17 – 2011-08-19 (×6): 125 mg via INTRAVENOUS
  Filled 2011-08-17 (×3): qty 2

## 2011-08-17 MED ORDER — ENOXAPARIN SODIUM 80 MG/0.8ML ~~LOC~~ SOLN
40.0000 mg | SUBCUTANEOUS | Status: DC
  Administered 2011-08-17 – 2011-08-19 (×3): 40 mg via SUBCUTANEOUS
  Filled 2011-08-17 (×2): qty 0.4

## 2011-08-17 MED ORDER — TIOTROPIUM BROMIDE MONOHYDRATE 18 MCG IN CAPS
18.0000 ug | ORAL_CAPSULE | Freq: Every day | RESPIRATORY_TRACT | Status: DC
  Administered 2011-08-18 – 2011-08-20 (×3): 18 ug via RESPIRATORY_TRACT
  Filled 2011-08-17: qty 5

## 2011-08-17 MED ORDER — PNEUMOCOCCAL VAC POLYVALENT 25 MCG/0.5ML IJ INJ
0.5000 mL | INJECTION | Freq: Once | INTRAMUSCULAR | Status: DC
  Filled 2011-08-17: qty 0.5

## 2011-08-17 MED ORDER — POLYETHYLENE GLYCOL 3350 17 G PO PACK
17.0000 g | PACK | Freq: Every day | ORAL | Status: DC
  Administered 2011-08-19: 17 g via ORAL
  Filled 2011-08-17: qty 1

## 2011-08-17 NOTE — Plan of Care (Signed)
Problem: Consults Goal: Respiratory Problems Patient Education See Patient Education Module for education specifics. Outcome: Completed/Met Date Met:  08/17/11 copd

## 2011-08-17 NOTE — ED Notes (Signed)
Pt co all over weakness, sob and cough x 1 wk now. Nad. Dr Ouida Sills gave her antibiotic x 2 days and just started to work today. Was told to bring pt here by Dr. Ouida Sills. Alert/oriented. Nad. Able to transfer from w/c to bed without help.

## 2011-08-17 NOTE — ED Provider Notes (Signed)
History     CSN: [id number] Arrival date & time: 08/17/2011 12:03 PM  Chief Complaint  Patient presents with  . Shortness of Breath  . Fatigue   HPI Comments: Patient c/o increased shortness of breath, generalized weakness and fatigue for one week.  States she has hx of frequent PNA and COPD and uses continous O2 2L [place] at home.  Has been using her nebulizer w/o relief.  States that her PMD started her on an antibiotic 2 days ago w/o significant improvement.  States she contacted his office today and was advised to come to ER.  She states her sx's are similar to her previous PNA sx's.  She denies chest pain, numbness, headache, vomiting or fever  Patient is a 60 y.o. female presenting with shortness of breath. The history is provided by the patient.  Shortness of Breath  The current episode started more than 1 week ago. The onset was gradual. The problem occurs continuously. The problem has been gradually worsening. The problem is mild. The symptoms are relieved by nothing. The symptoms are aggravated by activity. Associated symptoms include cough, shortness of breath and wheezing. Pertinent negatives include no chest pain, no chest pressure, no fever, no rhinorrhea and no sore throat. The cough is productive. Nothing relieves the cough. Nothing worsens the cough. There was no intake of a foreign body. She was not exposed to toxic fumes. She has not inhaled smoke recently. She has had no prior steroid use. Her past medical history is significant for past wheezing. Behavior: fatigue. There were no sick contacts.    Past Medical History  Diagnosis Date  . COPD (chronic obstructive pulmonary disease)   . CHF (congestive heart failure)   . Pneumonia     Past Surgical History  Procedure Date  . Abdominal hysterectomy     History reviewed. No pertinent family history.  History  Substance Use Topics  . Smoking status: Former Games developer  . Smokeless tobacco: Not on file  . Alcohol Use: No     OB History    Grav Para Term Preterm Abortions TAB SAB Ect Mult Living                  Review of Systems  Constitutional: Positive for appetite change and fatigue. Negative for fever and chills.  HENT: Negative for ear pain, sore throat, rhinorrhea, sneezing, trouble swallowing, neck pain and neck stiffness.   Respiratory: Positive for cough, shortness of breath and wheezing.   Cardiovascular: Negative.  Negative for chest pain.  Gastrointestinal: Negative.   Genitourinary: Negative for flank pain, decreased urine volume and difficulty urinating.  Musculoskeletal: Negative.   Skin: Negative.   Neurological: Positive for weakness. Negative for dizziness, seizures, speech difficulty, numbness and headaches.  Hematological: Does not bruise/bleed easily.  Psychiatric/Behavioral: Negative for confusion. The patient is not nervous/anxious.     Physical Exam  BP 144/87  Pulse 82  Temp(Src) 98.2 F (36.8 C) (Oral)  Resp 20  Ht 5\' 3"  (1.6 m)  Wt 165 lb (74.844 kg)  BMI 29.23 kg/m2  SpO2 92%  Physical Exam  Nursing note and vitals reviewed. Constitutional: She is oriented to person, place, and time. No distress.       Slightly frail appearing  HENT:  Head: Normocephalic and atraumatic.       Mucous membranes are  dry  Eyes: EOM are normal. Pupils are equal, round, and reactive to light.  Neck: Normal range of motion. Neck supple. No JVD present. No  thyromegaly present.  Cardiovascular: Normal rate, regular rhythm and normal heart sounds.   Pulmonary/Chest: Effort normal. Not tachypneic and not bradypneic. No respiratory distress. She has wheezes. She has rhonchi. She has no rales. She exhibits no tenderness.  Abdominal: Soft. Bowel sounds are normal. There is no tenderness. There is no rebound and no guarding.  Musculoskeletal: She exhibits no edema and no tenderness.  Lymphadenopathy:    She has no cervical adenopathy.  Neurological: She is alert and oriented to person,  place, and time. She has normal reflexes. No cranial nerve deficit. She exhibits normal muscle tone. Coordination normal.  Skin: Skin is warm and dry.  Psychiatric: She has a normal mood and affect.    ED Course  Procedures  MDM   1430  Patient is resting, NAD.  Vitals are stable.  No focal neuro deficits. Course lung sounds throughout.   I have reviewed the labs and imaging results with the pt and the EDP.  Sx's likely related to acute exacerbation of COPD.  Pt was also seen by EDP.  Will consult admitting doctor for possible admission.    1450  Lung sounds improved slight after neb.  Pt currently taking Ceftin.   I have also reviewed vitals and nursing notes.    Results for orders placed during the hospital encounter of 08/17/11  CBC      Component Value Range   WBC 7.6  4.0 - 10.5 (K/uL)   RBC 4.56  3.87 - 5.11 (MIL/uL)   Hemoglobin 13.9  12.0 - 15.0 (g/dL)   HCT 16.1  09.6 - 04.5 (%)   MCV 97.1  78.0 - 100.0 (fL)   MCH 30.5  26.0 - 34.0 (pg)   MCHC 31.4  30.0 - 36.0 (g/dL)   RDW 40.9  81.1 - 91.4 (%)   Platelets 187  150 - 400 (K/uL)  BASIC METABOLIC PANEL      Component Value Range   Sodium 137  135 - 145 (mEq/L)   Potassium 4.0  3.5 - 5.1 (mEq/L)   Chloride 95 (*) 96 - 112 (mEq/L)   CO2 39 (*) 19 - 32 (mEq/L)   Glucose, Bld 86  70 - 99 (mg/dL)   BUN 8  6 - 23 (mg/dL)   Creatinine, Ser <7.82 (*) 0.50 - 1.10 (mg/dL)   Calcium 9.2  8.4 - 95.6 (mg/dL)   GFR calc non Af Amer NOT CALCULATED  >60 (mL/min)   GFR calc Af Amer NOT CALCULATED  >60 (mL/min)  URINALYSIS, ROUTINE W REFLEX MICROSCOPIC      Component Value Range   Color, Urine YELLOW  YELLOW    Appearance CLEAR  CLEAR    Specific Gravity, Urine 1.015  1.005 - 1.030    pH 6.0  5.0 - 8.0    Glucose, UA NEGATIVE  NEGATIVE (mg/dL)   Hgb urine dipstick TRACE (*) NEGATIVE    Bilirubin Urine NEGATIVE  NEGATIVE    Ketones, ur NEGATIVE  NEGATIVE (mg/dL)   Protein, ur NEGATIVE  NEGATIVE (mg/dL)   Urobilinogen, UA 0.2   0.0 - 1.0 (mg/dL)   Nitrite NEGATIVE  NEGATIVE    Leukocytes, UA NEGATIVE  NEGATIVE   URINE MICROSCOPIC-ADD ON      Component Value Range   Squamous Epithelial / LPF RARE  RARE    WBC, UA 0-2  <3 (WBC/hpf)   RBC / HPF 3-6  <3 (RBC/hpf)  DIFFERENTIAL      Component Value Range   Neutrophils Relative 61  43 - 77 (%)   Neutro Abs 4.7  1.7 - 7.7 (K/uL)   Lymphocytes Relative 23  12 - 46 (%)   Lymphs Abs 1.8  0.7 - 4.0 (K/uL)   Monocytes Relative 14 (*) 3 - 12 (%)   Monocytes Absolute 1.0  0.1 - 1.0 (K/uL)   Eosinophils Relative 2  0 - 5 (%)   Eosinophils Absolute 0.1  0.0 - 0.7 (K/uL)   Basophils Relative 0  0 - 1 (%)   Basophils Absolute 0.0  0.0 - 0.1 (K/uL)  HEPATIC FUNCTION PANEL      Component Value Range   Total Protein 7.2  6.0 - 8.3 (g/dL)   Albumin 3.7  3.5 - 5.2 (g/dL)   AST 14  0 - 37 (U/L)   ALT 19  0 - 35 (U/L)   Alkaline Phosphatase 67  39 - 117 (U/L)   Total Bilirubin 0.4  0.3 - 1.2 (mg/dL)   Bilirubin, Direct 0.1  0.0 - 0.3 (mg/dL)   Indirect Bilirubin 0.3  0.3 - 0.9 (mg/dL)     Dg Chest 2 View  04/03/8118  *RADIOLOGY REPORT*  Clinical Data: Shortness of breath and cough.  CHEST - 2 VIEW  Comparison: 09/20/2005.  Findings: Volume loss in the right hemithorax is stable as is the right basilar pleural parenchymal scarring. Cardiopericardial silhouette is at upper limits of normal for size.  Left lung is clear.  Imaged bony structures of the thorax are intact.  IMPRESSION: Stable pleural parenchymal scarring at the right base.  No new or progressive findings.  Original Report Authenticated By: ERIC A. MANSELL, M.D.        Leonor Darnell L. [place], [place] 08/17/11 1512

## 2011-08-17 NOTE — H&P (Signed)
[name] MRN: [id number] DOB/AGE: Aug 12, 1951 60 y.o. Primary Care Physician:FAGAN,ROY, MD Admit date: 08/17/2011 Chief Complaint: shortness of breath HPI: this is a 60 year old who came to the emergency room because of shortness of breath. She had been having trouble for about 2 week She was started on antibiotics 2 days ago but has continued having problems. She has been coughing and congested but has not coughed up much of anything. She is unsure if she's had fever.she has a long history of COPD with multiple exacerbations. She denies any chest pain hemoptysis or night sweats  Past Medical History  Diagnosis Date  . COPD (chronic obstructive pulmonary disease)   . CHF (congestive heart failure)   . Pneumonia   . Anxiety    Past Surgical History  Procedure Date  . Abdominal hysterectomy         History reviewed. No pertinent family history.no definite family history of COPD  Social History:  reports that she has quit smoking. She does not have any smokeless tobacco history on file. She reports that she does not drink alcohol or use illicit drugs.   Allergies: No Known Allergies  Medications Prior to Admission  Medication Dose Route Frequency Provider Last Rate Last Dose  . 0.9 %  sodium chloride infusion   Intravenous Continuous Fredirick Maudlin 125 mL/hr at 08/17/11 1844    . acetaminophen (TYLENOL) tablet 650 mg  650 mg Oral Q6H PRN Fredirick Maudlin       Or  . acetaminophen (TYLENOL) suppository 650 mg  650 mg Rectal Q6H PRN Fredirick Maudlin      . albuterol (PROVENTIL) (5 MG/ML) 0.5% nebulizer solution 2.5 mg  2.5 mg Nebulization Once Tammy L. Triplett, PA   2.5 mg at 08/17/11 1351  . albuterol (PROVENTIL) (5 MG/ML) 0.5% nebulizer solution 2.5 mg  2.5 mg Nebulization Q4H Leonette Tischer L Elainna Eshleman   2.5 mg at 08/17/11 1909  . antiseptic oral rinse (BIOTENE) solution   Mouth Rinse BID Fredirick Maudlin      . benzonatate (TESSALON) capsule 200 mg  200 mg Oral TID PRN Fredirick Maudlin      . cefTRIAXone (ROCEPHIN) 1 g in dextrose 5 % 50 mL IVPB  1 g Intravenous Q24H Fredirick Maudlin      . enoxaparin (LOVENOX) injection 40 mg  40 mg Subcutaneous Q24H Fredirick Maudlin      . guaiFENesin (MUCINEX) 12 hr tablet 1,200 mg  1,200 mg Oral BID Fredirick Maudlin      . HYDROcodone-acetaminophen (NORCO) 5-325 MG per tablet 1-2 tablet  1-2 tablet Oral Q4H PRN Fredirick Maudlin      . methylPREDNISolone sodium succinate (SOLU-MEDROL) injection 125 mg  125 mg Intravenous Once Tammy L. Triplett, PA   125 mg at 08/17/11 1339  . methylPREDNISolone sodium succinate (SOLU-MEDROL) injection 125 mg  125 mg Intravenous Q6H Fredirick Maudlin      . pneumococcal 23 valent vaccine (PNU-IMMUNE) injection 0.5 mL  0.5 mL Intramuscular Once Fredirick Maudlin      . polyethylene glycol (MIRALAX / GLYCOLAX) packet 17 g  17 g Oral Daily Fredirick Maudlin      . sodium chloride 0.9 % bolus 500 mL  500 mL Intravenous Once Tammy L. Triplett, PA   500 mL at 08/17/11 1418  . tiotropium (SPIRIVA) inhalation capsule 18 mcg  18 mcg Inhalation Daily Fredirick Maudlin      . traZODone (DESYREL) tablet 25 mg  25 mg Oral QHS PRN Fredirick Maudlin      . DISCONTD: polyethylene glycol (MIRALAX / GLYCOLAX) packet 17 g  17 g Oral Daily Fredirick Maudlin       No current outpatient prescriptions on file as of 08/17/2011.       ION:GEXBM from the symptoms mentioned above,there are no other symptoms referable to all systems reviewed.  Physical Exam: Blood pressure 135/87, pulse 82, temperature 98.9 F (37.2 C), temperature source Oral, resp. rate 18, height 5\' 3"  (1.6 m), weight 72.3 kg (159 lb 6.3 oz), SpO2 92.00%. Except as mentioned is n egative  Results for orders placed during the hospital encounter of 08/17/11 (from the past 48 hour(s))  CBC     Status: Normal   Collection Time   08/17/11 12:47 PM      Component Value Range Comment   WBC 7.6  4.0 - 10.5 (K/uL)    RBC 4.56  3.87 - 5.11 (MIL/uL)    Hemoglobin  13.9  12.0 - 15.0 (g/dL)    HCT 84.1  32.4 - 40.1 (%)    MCV 97.1  78.0 - 100.0 (fL)    MCH 30.5  26.0 - 34.0 (pg)    MCHC 31.4  30.0 - 36.0 (g/dL)    RDW 02.7  25.3 - 66.4 (%)    Platelets 187  150 - 400 (K/uL)   BASIC METABOLIC PANEL     Status: Abnormal   Collection Time   08/17/11 12:47 PM      Component Value Range Comment   Sodium 137  135 - 145 (mEq/L)    Potassium 4.0  3.5 - 5.1 (mEq/L)    Chloride 95 (*) 96 - 112 (mEq/L)    CO2 39 (*) 19 - 32 (mEq/L)    Glucose, Bld 86  70 - 99 (mg/dL)    BUN 8  6 - 23 (mg/dL)    Creatinine, Ser <4.03 (*) 0.50 - 1.10 (mg/dL)    Calcium 9.2  8.4 - 10.5 (mg/dL)    GFR calc non Af Amer NOT CALCULATED  >60 (mL/min)    GFR calc Af Amer NOT CALCULATED  >60 (mL/min)   DIFFERENTIAL     Status: Abnormal   Collection Time   08/17/11 12:47 PM      Component Value Range Comment   Neutrophils Relative 61  43 - 77 (%)    Neutro Abs 4.7  1.7 - 7.7 (K/uL)    Lymphocytes Relative 23  12 - 46 (%)    Lymphs Abs 1.8  0.7 - 4.0 (K/uL)    Monocytes Relative 14 (*) 3 - 12 (%)    Monocytes Absolute 1.0  0.1 - 1.0 (K/uL)    Eosinophils Relative 2  0 - 5 (%)    Eosinophils Absolute 0.1  0.0 - 0.7 (K/uL)    Basophils Relative 0  0 - 1 (%)    Basophils Absolute 0.0  0.0 - 0.1 (K/uL)   HEPATIC FUNCTION PANEL     Status: Normal   Collection Time   08/17/11 12:47 PM      Component Value Range Comment   Total Protein 7.2  6.0 - 8.3 (g/dL)    Albumin 3.7  3.5 - 5.2 (g/dL)    AST 14  0 - 37 (U/L)    ALT 19  0 - 35 (U/L)    Alkaline Phosphatase 67  39 - 117 (U/L)    Total Bilirubin 0.4  0.3 - 1.2 (mg/dL)  Bilirubin, Direct 0.1  0.0 - 0.3 (mg/dL)    Indirect Bilirubin 0.3  0.3 - 0.9 (mg/dL)   URINALYSIS, ROUTINE W REFLEX MICROSCOPIC     Status: Abnormal   Collection Time   08/17/11  1:02 PM      Component Value Range Comment   Color, Urine YELLOW  YELLOW     Appearance CLEAR  CLEAR     Specific Gravity, Urine 1.015  1.005 - 1.030     pH 6.0  5.0 - 8.0      Glucose, UA NEGATIVE  NEGATIVE (mg/dL)    Hgb urine dipstick TRACE (*) NEGATIVE     Bilirubin Urine NEGATIVE  NEGATIVE     Ketones, ur NEGATIVE  NEGATIVE (mg/dL)    Protein, ur NEGATIVE  NEGATIVE (mg/dL)    Urobilinogen, UA 0.2  0.0 - 1.0 (mg/dL)    Nitrite NEGATIVE  NEGATIVE     Leukocytes, UA NEGATIVE  NEGATIVE    URINE MICROSCOPIC-ADD ON     Status: Normal   Collection Time   08/17/11  1:02 PM      Component Value Range Comment   Squamous Epithelial / LPF RARE  RARE     WBC, UA 0-2  <3 (WBC/hpf)    RBC / HPF 3-6  <3 (RBC/hpf)    No results found for this or any previous visit (from the past 240 hour(s)).  Dg Chest 2 View  08/17/2011  *RADIOLOGY REPORT*  Clinical Data: Shortness of breath and cough.  CHEST - 2 VIEW  Comparison: 09/20/2005.  Findings: Volume loss in the right hemithorax is stable as is the right basilar pleural parenchymal scarring. Cardiopericardial silhouette is at upper limits of normal for size.  Left lung is clear.  Imaged bony structures of the thorax are intact.  IMPRESSION: Stable pleural parenchymal scarring at the right base.  No new or progressive findings.  Original Report Authenticated By: ERIC A. MANSELL, M.D.   Impression: She is admitted with COPD exacerbation. Active Problems:  * No active hospital problems. *      Plan: She will be treated with intravenous antibiotics IV steroids inhaled bronchodilators etcetera. She has requested to restart on Advair which she had stopped in the past because she had thrush.      Rayce Brahmbhatt L 08/17/2011, 7:17 PM

## 2011-08-17 NOTE — ED Notes (Signed)
Resting quietly in bed. Remains alert and oriented. Denies pain or SOB at this time. On 2l o2 per n/c. Family at bedside.

## 2011-08-18 LAB — BASIC METABOLIC PANEL
Calcium: 8.7 mg/dL (ref 8.4–10.5)
Chloride: 101 mEq/L (ref 96–112)
Creatinine, Ser: <0.47 mg/dL — ABNORMAL LOW (ref 0.50–1.10)

## 2011-08-18 LAB — CBC
HCT: 39.6 % (ref 36.0–46.0)
Platelets: 179 10*3/uL (ref 150–400)
RDW: 12.6 % (ref 11.5–15.5)
WBC: 4.4 10*3/uL (ref 4.0–10.5)

## 2011-08-18 NOTE — Progress Notes (Signed)
UR Chart Review Completed  

## 2011-08-18 NOTE — Progress Notes (Signed)
NAMESHAWNETTE, AUGELLO              ACCOUNT NO.:  [id number]  MEDICAL RECORD NO.:  [id number]  LOCATION:  A301                          FACILITY:  [facility]  PHYSICIAN:  Kingsley Callander. Ouida Sills, MD       DATE OF BIRTH:  [birth year]  DATE OF PROCEDURE: DATE OF DISCHARGE:                                PROGRESS NOTE   Ms. Wohlers was admitted yesterday with a COPD exacerbation.  Chest film revealed no evidence of pneumonia.  She is feeling better today.  She is less short of breath.  VITAL SIGNS:  She has had no fever, temperature is 98.1 this morning with a pulse of 90, respirations 16, and blood pressure 115/69, oxygen saturation is 90%. LUNGS:  Lungs sounds are diffusely diminished. HEART:  Regular with no murmurs. ABDOMEN:  Soft and nontender. EXTREMITIES:  No clubbing or edema.  IMPRESSION/PLAN:  Chronic obstructive pulmonary disease exacerbation, improved.  Continue ceftriaxone, Solu-Medrol and nebulizers.  White count this morning is 4.4 with a hemoglobin of 12.4, and platelet count of 142,000.  Her metabolic profile reveals an elevated CO2 of 36.     Kingsley Callander. Ouida Sills, MD     ROF/MEDQ  D:  08/18/2011  T:  08/18/2011  Job:  [id number]

## 2011-08-18 NOTE — ED Provider Notes (Signed)
Medical screening examination/treatment/procedure(s) were performed by non-physician practitioner and as supervising physician I was immediately available for consultation/collaboration.   Benny Lennert, MD 08/18/11 [phone]

## 2011-08-19 MED ORDER — SODIUM CHLORIDE 0.9 % IN NEBU
INHALATION_SOLUTION | RESPIRATORY_TRACT | Status: AC
  Administered 2011-08-19: 3 mL
  Filled 2011-08-19: qty 3

## 2011-08-19 MED ORDER — METHYLPREDNISOLONE SODIUM SUCC 125 MG IJ SOLR
60.0000 mg | Freq: Four times a day (QID) | INTRAMUSCULAR | Status: DC
  Administered 2011-08-19 – 2011-08-20 (×5): 60 mg via INTRAVENOUS
  Filled 2011-08-19 (×5): qty 2

## 2011-08-19 NOTE — Progress Notes (Signed)
[name], [name]              ACCOUNT NO.:  [id number]  MEDICAL RECORD NO.:  [id number]  LOCATION:  A301                          FACILITY:  [facility]  PHYSICIAN:  Kingsley Callander. Ouida Sills, MD       DATE OF BIRTH:  Jan 07, 1951  DATE OF PROCEDURE:  08/19/2011 DATE OF DISCHARGE:                                PROGRESS NOTE   [name] is feeling better.  She still had some wheezing overnight.  PHYSICAL EXAMINATION:  VITAL SIGNS:  Temperature is 98.3 this morning with a pulse of 85, respirations 20 and blood pressure 150/79.  Her oxygen saturation is 94%. CHEST:  Lungs sounds are diminished with minimal wheezes. HEART:  Regular with no murmurs. ABDOMEN:  Nontender. EXTREMITIES:  Revealed no edema.  IMPRESSION/PLAN:  Chronic obstructive pulmonary disease exacerbation. She is feeling somewhat jittery which she attributes to Solu-Medrol. Her dose will be decreased.  Fortunately, she has no definite evidence of pneumonia.  Her chest x-ray revealed stable pleural parenchymal scarring in the right base.  Her white count was 4.4 yesterday.  She will continue nebulizers, fluids, and antibiotics (Rocephin).     Kingsley Callander. Ouida Sills, MD     ROF/MEDQ  D:  08/19/2011  T:  08/19/2011  Job:  [id number]

## 2011-08-19 NOTE — Progress Notes (Signed)
Patient oxygen saturation at rest today has been 92-94% on 3 lpm nasal cannula.  Helped patient to the bathroom and back to bed and oxygen dropped to 80%.  Saturation slowly increased back into the 90's.

## 2011-08-20 MED ORDER — FLUTICASONE-SALMETEROL 250-50 MCG/DOSE IN AEPB: 1.0000 | INHALATION_SPRAY | Freq: Two times a day (BID) | RESPIRATORY_TRACT | Status: DC

## 2011-08-20 MED ORDER — CEFDINIR 300 MG PO CAPS: ORAL_CAPSULE | ORAL | Status: DC

## 2011-08-20 MED ORDER — PREDNISONE (PAK) 10 MG PO TABS: ORAL_TABLET | ORAL | Status: DC

## 2011-08-20 NOTE — Progress Notes (Addendum)
Called Dr. Ouida Sills to inform him of blood pressures of 199/89 (patient previously up to bathroom) and 174/82 (back in bed for 25 minutes). He will continue to monitor blood pressures.  No orders given at this time. Patient asymptomatic. Rolm Bookbinder. Christell Constant, RN

## 2011-08-20 NOTE — Discharge Summary (Signed)
NAMESAMAI, [name]              ACCOUNT NO.:  [id number]  MEDICAL RECORD NO.:  [id number]  LOCATION:  A301                          FACILITY:  [facility]  PHYSICIAN:  Kingsley Callander. Ouida Sills, MD       DATE OF BIRTH:  Jul 15, 1951  DATE OF ADMISSION:  08/17/2011 DATE OF DISCHARGE:  LH                              DISCHARGE SUMMARY   DISCHARGE DIAGNOSES: 1. Chronic obstructive pulmonary disease exacerbation. 2. Anxiety.  DISCHARGE MEDICATIONS: 1. Omnicef 300 mg b.i.d. for 7 days. 2. Prednisone 40 mg daily. 3. Advair 250 mg 1 puff b.i.d. 4. Albuterol nebulizers q.4 h. 5. Spiriva 1 puff daily. 6. Xanax 1 mg t.i.d. p.r.n. 7. Vicodin q.4 h p.r.n. 8. Oxygen 2 liters by nasal cannula.  HOSPITAL COURSE:  This patient is a 60 year old female who presented with shortness of breath, cough and wheezing.  She was hospitalized and treated with albuterol nebulizer treatments, ceftriaxone, Solu-Medrol, and supplemental oxygen.  She was continued on Spiriva.  Her white count was 4.4.  Chest x-ray revealed no acute infiltrate, but revealed some stable parenchymal scarring in the right base.  Respiratory status significantly improved.  Her Solu-Medrol dose was tapered.  She was improved to the point that she was stable for discharge on the morning of the 25th.  She will remain on home oxygen as she is used previously she will continue home nebulizer treatments. Advair has been added.  Antibiotics have been modified to [facility]  Her condition at discharge is much improved.  FOLLOWUP:  The patient will be seen in my office in 1 week.  Prednisone will be tapered further at that point.    Kingsley Callander. Ouida Sills, MD    ROF/MEDQ  D:  08/20/2011  T:  08/20/2011  Job:  [id number]

## 2011-08-20 NOTE — Progress Notes (Signed)
Patient was given discharge instructions along with follow up appointments. Patient verbalized understanding of all instructions. Patient was escorted by staff via wheelchair to vehicle. Patient discharged to home in stable condition. 

## 2011-09-05 NOTE — Progress Notes (Signed)
Encounter addended by: Clarene Critchley on: 09/05/2011 11:02 AM<BR>     Documentation filed: Flowsheet VN

## 2012-02-20 DIAGNOSIS — H53039 Strabismic amblyopia, unspecified eye: Secondary | ICD-10-CM | POA: Diagnosis not present

## 2012-02-20 DIAGNOSIS — H5010 Unspecified exotropia: Secondary | ICD-10-CM | POA: Diagnosis not present

## 2012-02-20 DIAGNOSIS — H2510 Age-related nuclear cataract, unspecified eye: Secondary | ICD-10-CM | POA: Diagnosis not present

## 2012-03-29 DIAGNOSIS — I10 Essential (primary) hypertension: Secondary | ICD-10-CM | POA: Diagnosis not present

## 2012-03-29 DIAGNOSIS — J449 Chronic obstructive pulmonary disease, unspecified: Secondary | ICD-10-CM | POA: Diagnosis not present

## 2012-05-02 DIAGNOSIS — I10 Essential (primary) hypertension: Secondary | ICD-10-CM | POA: Diagnosis not present

## 2012-05-02 DIAGNOSIS — J449 Chronic obstructive pulmonary disease, unspecified: Secondary | ICD-10-CM | POA: Diagnosis not present

## 2012-05-23 DIAGNOSIS — J209 Acute bronchitis, unspecified: Secondary | ICD-10-CM | POA: Diagnosis not present

## 2012-07-02 ENCOUNTER — Other Ambulatory Visit (HOSPITAL_COMMUNITY): Payer: Self-pay | Admitting: Internal Medicine

## 2012-07-02 DIAGNOSIS — Z139 Encounter for screening, unspecified: Secondary | ICD-10-CM

## 2012-07-09 ENCOUNTER — Ambulatory Visit (HOSPITAL_COMMUNITY)
Admission: RE | Admit: 2012-07-09 | Discharge: 2012-07-09 | Disposition: A | Discharge status: Home / Self Care | Payer: Medicare Other | Source: Ambulatory Visit | Attending: Internal Medicine | Admitting: Internal Medicine

## 2012-07-09 DIAGNOSIS — Z139 Encounter for screening, unspecified: Secondary | ICD-10-CM

## 2012-07-09 DIAGNOSIS — I10 Essential (primary) hypertension: Secondary | ICD-10-CM | POA: Diagnosis not present

## 2012-07-09 DIAGNOSIS — Z1231 Encounter for screening mammogram for malignant neoplasm of breast: Secondary | ICD-10-CM | POA: Diagnosis not present

## 2012-07-09 DIAGNOSIS — J449 Chronic obstructive pulmonary disease, unspecified: Secondary | ICD-10-CM | POA: Diagnosis not present

## 2012-08-30 ENCOUNTER — Emergency Department (HOSPITAL_COMMUNITY)
Admission: EM | Admit: 2012-08-30 | Discharge: 2012-08-30 | Disposition: A | Discharge status: Home / Self Care | Payer: No Typology Code available for payment source | Attending: Emergency Medicine | Admitting: Emergency Medicine

## 2012-08-30 ENCOUNTER — Encounter (HOSPITAL_COMMUNITY): Payer: Self-pay | Admitting: Family Medicine

## 2012-08-30 DIAGNOSIS — S139XXA Sprain of joints and ligaments of unspecified parts of neck, initial encounter: Secondary | ICD-10-CM | POA: Diagnosis not present

## 2012-08-30 DIAGNOSIS — S39012A Strain of muscle, fascia and tendon of lower back, initial encounter: Secondary | ICD-10-CM

## 2012-08-30 DIAGNOSIS — Y92410 Unspecified street and highway as the place of occurrence of the external cause: Secondary | ICD-10-CM | POA: Insufficient documentation

## 2012-08-30 DIAGNOSIS — I10 Essential (primary) hypertension: Secondary | ICD-10-CM | POA: Insufficient documentation

## 2012-08-30 DIAGNOSIS — M62838 Other muscle spasm: Secondary | ICD-10-CM

## 2012-08-30 DIAGNOSIS — S335XXA Sprain of ligaments of lumbar spine, initial encounter: Secondary | ICD-10-CM | POA: Insufficient documentation

## 2012-08-30 DIAGNOSIS — Z87891 Personal history of nicotine dependence: Secondary | ICD-10-CM | POA: Insufficient documentation

## 2012-08-30 DIAGNOSIS — IMO0002 Reserved for concepts with insufficient information to code with codable children: Secondary | ICD-10-CM

## 2012-08-30 DIAGNOSIS — F411 Generalized anxiety disorder: Secondary | ICD-10-CM | POA: Insufficient documentation

## 2012-08-30 HISTORY — DX: Essential (primary) hypertension: I10

## 2012-08-30 MED ORDER — CYCLOBENZAPRINE HCL 10 MG PO TABS
10.0000 mg | ORAL_TABLET | Freq: Once | ORAL | Status: AC
  Administered 2012-08-30: 10 mg via ORAL
  Filled 2012-08-30: qty 1

## 2012-08-30 MED ORDER — IBUPROFEN 800 MG PO TABS
800.0000 mg | ORAL_TABLET | Freq: Once | ORAL | Status: AC
  Administered 2012-08-30: 800 mg via ORAL
  Filled 2012-08-30: qty 1

## 2012-08-30 MED ORDER — CYCLOBENZAPRINE HCL 10 MG PO TABS: 10.0000 mg | ORAL_TABLET | Freq: Two times a day (BID) | ORAL | Status: AC | PRN

## 2012-08-30 MED ORDER — IBUPROFEN 600 MG PO TABS: 600.0000 mg | ORAL_TABLET | Freq: Four times a day (QID) | ORAL | Status: AC | PRN
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
## 2012-08-30 NOTE — ED Provider Notes (Signed)
Medical screening examination/treatment/procedure(s) were performed by non-physician practitioner and as supervising physician I was immediately available for consultation/collaboration.   Itati Brocksmith B. Bernette Mayers, MD 08/30/12 1700
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
## 2012-09-06 DIAGNOSIS — M549 Dorsalgia, unspecified: Secondary | ICD-10-CM | POA: Diagnosis not present

## 2012-09-14 DIAGNOSIS — M542 Cervicalgia: Secondary | ICD-10-CM | POA: Diagnosis not present

## 2012-09-14 DIAGNOSIS — M545 Low back pain: Secondary | ICD-10-CM | POA: Diagnosis not present

## 2012-09-18 DIAGNOSIS — M545 Low back pain: Secondary | ICD-10-CM | POA: Diagnosis not present

## 2012-09-18 DIAGNOSIS — M542 Cervicalgia: Secondary | ICD-10-CM | POA: Diagnosis not present

## 2012-09-20 DIAGNOSIS — M545 Low back pain: Secondary | ICD-10-CM | POA: Diagnosis not present

## 2012-09-20 DIAGNOSIS — M542 Cervicalgia: Secondary | ICD-10-CM | POA: Diagnosis not present

## 2012-09-26 DIAGNOSIS — M542 Cervicalgia: Secondary | ICD-10-CM | POA: Diagnosis not present

## 2012-09-26 DIAGNOSIS — M545 Low back pain: Secondary | ICD-10-CM | POA: Diagnosis not present

## 2012-09-27 DIAGNOSIS — M545 Low back pain: Secondary | ICD-10-CM | POA: Diagnosis not present

## 2012-09-27 DIAGNOSIS — M542 Cervicalgia: Secondary | ICD-10-CM | POA: Diagnosis not present

## 2012-09-28 DIAGNOSIS — M549 Dorsalgia, unspecified: Secondary | ICD-10-CM | POA: Diagnosis not present

## 2012-10-02 DIAGNOSIS — M545 Low back pain: Secondary | ICD-10-CM | POA: Diagnosis not present

## 2012-10-02 DIAGNOSIS — M542 Cervicalgia: Secondary | ICD-10-CM | POA: Diagnosis not present

## 2012-10-04 DIAGNOSIS — M542 Cervicalgia: Secondary | ICD-10-CM | POA: Diagnosis not present

## 2012-10-04 DIAGNOSIS — M545 Low back pain: Secondary | ICD-10-CM | POA: Diagnosis not present

## 2012-10-09 DIAGNOSIS — M542 Cervicalgia: Secondary | ICD-10-CM | POA: Diagnosis not present

## 2012-10-09 DIAGNOSIS — M545 Low back pain: Secondary | ICD-10-CM | POA: Diagnosis not present

## 2012-10-11 DIAGNOSIS — M542 Cervicalgia: Secondary | ICD-10-CM | POA: Diagnosis not present

## 2012-10-11 DIAGNOSIS — M545 Low back pain: Secondary | ICD-10-CM | POA: Diagnosis not present

## 2012-10-16 DIAGNOSIS — M542 Cervicalgia: Secondary | ICD-10-CM | POA: Diagnosis not present

## 2012-10-16 DIAGNOSIS — M545 Low back pain: Secondary | ICD-10-CM | POA: Diagnosis not present

## 2012-10-18 DIAGNOSIS — M542 Cervicalgia: Secondary | ICD-10-CM | POA: Diagnosis not present

## 2012-10-18 DIAGNOSIS — M545 Low back pain: Secondary | ICD-10-CM | POA: Diagnosis not present

## 2012-10-19 DIAGNOSIS — Z23 Encounter for immunization: Secondary | ICD-10-CM | POA: Diagnosis not present

## 2012-10-23 DIAGNOSIS — M545 Low back pain: Secondary | ICD-10-CM | POA: Diagnosis not present

## 2012-10-23 DIAGNOSIS — M542 Cervicalgia: Secondary | ICD-10-CM | POA: Diagnosis not present

## 2012-10-25 DIAGNOSIS — M545 Low back pain: Secondary | ICD-10-CM | POA: Diagnosis not present

## 2012-10-25 DIAGNOSIS — M542 Cervicalgia: Secondary | ICD-10-CM | POA: Diagnosis not present

## 2012-10-29 DIAGNOSIS — M79609 Pain in unspecified limb: Secondary | ICD-10-CM | POA: Diagnosis not present

## 2012-10-30 DIAGNOSIS — M542 Cervicalgia: Secondary | ICD-10-CM | POA: Diagnosis not present

## 2012-10-30 DIAGNOSIS — M545 Low back pain: Secondary | ICD-10-CM | POA: Diagnosis not present

## 2012-11-01 DIAGNOSIS — M542 Cervicalgia: Secondary | ICD-10-CM | POA: Diagnosis not present

## 2012-11-01 DIAGNOSIS — M545 Low back pain: Secondary | ICD-10-CM | POA: Diagnosis not present

## 2012-11-06 DIAGNOSIS — M545 Low back pain: Secondary | ICD-10-CM | POA: Diagnosis not present

## 2012-11-06 DIAGNOSIS — M542 Cervicalgia: Secondary | ICD-10-CM | POA: Diagnosis not present

## 2012-11-08 DIAGNOSIS — M542 Cervicalgia: Secondary | ICD-10-CM | POA: Diagnosis not present

## 2012-11-08 DIAGNOSIS — M545 Low back pain: Secondary | ICD-10-CM | POA: Diagnosis not present

## 2012-11-13 DIAGNOSIS — M542 Cervicalgia: Secondary | ICD-10-CM | POA: Diagnosis not present

## 2012-11-13 DIAGNOSIS — M545 Low back pain: Secondary | ICD-10-CM | POA: Diagnosis not present

## 2012-11-15 DIAGNOSIS — M542 Cervicalgia: Secondary | ICD-10-CM | POA: Diagnosis not present

## 2012-11-15 DIAGNOSIS — M545 Low back pain: Secondary | ICD-10-CM | POA: Diagnosis not present

## 2012-11-27 DIAGNOSIS — M542 Cervicalgia: Secondary | ICD-10-CM | POA: Diagnosis not present

## 2012-11-27 DIAGNOSIS — M545 Low back pain: Secondary | ICD-10-CM | POA: Diagnosis not present

## 2012-11-29 DIAGNOSIS — M545 Low back pain: Secondary | ICD-10-CM | POA: Diagnosis not present

## 2012-11-29 DIAGNOSIS — M542 Cervicalgia: Secondary | ICD-10-CM | POA: Diagnosis not present

## 2012-12-04 DIAGNOSIS — M542 Cervicalgia: Secondary | ICD-10-CM | POA: Diagnosis not present

## 2012-12-04 DIAGNOSIS — M545 Low back pain: Secondary | ICD-10-CM | POA: Diagnosis not present

## 2012-12-06 DIAGNOSIS — M542 Cervicalgia: Secondary | ICD-10-CM | POA: Diagnosis not present

## 2012-12-06 DIAGNOSIS — M545 Low back pain: Secondary | ICD-10-CM | POA: Diagnosis not present

## 2012-12-10 DIAGNOSIS — Z79899 Other long term (current) drug therapy: Secondary | ICD-10-CM | POA: Diagnosis not present

## 2012-12-10 DIAGNOSIS — I10 Essential (primary) hypertension: Secondary | ICD-10-CM | POA: Diagnosis not present

## 2012-12-12 DIAGNOSIS — M545 Low back pain: Secondary | ICD-10-CM | POA: Diagnosis not present

## 2012-12-12 DIAGNOSIS — M542 Cervicalgia: Secondary | ICD-10-CM | POA: Diagnosis not present

## 2012-12-17 DIAGNOSIS — J449 Chronic obstructive pulmonary disease, unspecified: Secondary | ICD-10-CM | POA: Diagnosis not present

## 2012-12-17 DIAGNOSIS — F411 Generalized anxiety disorder: Secondary | ICD-10-CM | POA: Diagnosis not present

## 2012-12-17 DIAGNOSIS — I10 Essential (primary) hypertension: Secondary | ICD-10-CM | POA: Diagnosis not present

## 2012-12-21 DIAGNOSIS — M542 Cervicalgia: Secondary | ICD-10-CM | POA: Diagnosis not present

## 2012-12-21 DIAGNOSIS — M545 Low back pain: Secondary | ICD-10-CM | POA: Diagnosis not present

## 2013-01-07 DIAGNOSIS — R809 Proteinuria, unspecified: Secondary | ICD-10-CM | POA: Diagnosis not present

## 2013-01-07 DIAGNOSIS — Z1389 Encounter for screening for other disorder: Secondary | ICD-10-CM | POA: Diagnosis not present

## 2013-01-07 DIAGNOSIS — Z01419 Encounter for gynecological examination (general) (routine) without abnormal findings: Secondary | ICD-10-CM | POA: Diagnosis not present

## 2013-01-07 DIAGNOSIS — Z1212 Encounter for screening for malignant neoplasm of rectum: Secondary | ICD-10-CM | POA: Diagnosis not present

## 2013-02-21 DIAGNOSIS — H2510 Age-related nuclear cataract, unspecified eye: Secondary | ICD-10-CM | POA: Diagnosis not present

## 2013-02-21 DIAGNOSIS — H53039 Strabismic amblyopia, unspecified eye: Secondary | ICD-10-CM | POA: Diagnosis not present

## 2013-02-21 DIAGNOSIS — H5010 Unspecified exotropia: Secondary | ICD-10-CM | POA: Diagnosis not present

## 2013-03-18 ENCOUNTER — Other Ambulatory Visit (HOSPITAL_COMMUNITY): Payer: Self-pay | Admitting: Internal Medicine

## 2013-03-18 DIAGNOSIS — R1031 Right lower quadrant pain: Secondary | ICD-10-CM

## 2013-03-18 DIAGNOSIS — Z0189 Encounter for other specified special examinations: Secondary | ICD-10-CM | POA: Diagnosis not present

## 2013-03-20 ENCOUNTER — Ambulatory Visit (HOSPITAL_COMMUNITY)
Admission: RE | Admit: 2013-03-20 | Discharge: 2013-03-20 | Disposition: A | Discharge status: Home / Self Care | Payer: Medicare Other | Source: Ambulatory Visit | Attending: Internal Medicine | Admitting: Internal Medicine

## 2013-03-20 DIAGNOSIS — R1031 Right lower quadrant pain: Secondary | ICD-10-CM | POA: Insufficient documentation

## 2013-03-20 DIAGNOSIS — D3500 Benign neoplasm of unspecified adrenal gland: Secondary | ICD-10-CM | POA: Insufficient documentation

## 2013-03-20 DIAGNOSIS — K5900 Constipation, unspecified: Secondary | ICD-10-CM | POA: Insufficient documentation

## 2013-03-20 MED ORDER — IOHEXOL 300 MG/ML  SOLN
100.0000 mL | Freq: Once | INTRAMUSCULAR | Status: AC | PRN
  Administered 2013-03-20: 100 mL via INTRAVENOUS

## 2013-07-30 ENCOUNTER — Other Ambulatory Visit (HOSPITAL_COMMUNITY): Payer: Self-pay | Admitting: Internal Medicine

## 2013-07-30 DIAGNOSIS — Z139 Encounter for screening, unspecified: Secondary | ICD-10-CM

## 2013-08-06 ENCOUNTER — Ambulatory Visit (HOSPITAL_COMMUNITY): Payer: Medicare Other

## 2013-08-12 ENCOUNTER — Ambulatory Visit (HOSPITAL_COMMUNITY)
Admission: RE | Admit: 2013-08-12 | Discharge: 2013-08-12 | Disposition: A | Discharge status: Home / Self Care | Payer: Medicare Other | Source: Ambulatory Visit | Attending: Internal Medicine | Admitting: Internal Medicine

## 2013-08-12 DIAGNOSIS — Z1231 Encounter for screening mammogram for malignant neoplasm of breast: Secondary | ICD-10-CM | POA: Diagnosis not present

## 2013-08-12 DIAGNOSIS — Z139 Encounter for screening, unspecified: Secondary | ICD-10-CM

## 2013-10-01 DIAGNOSIS — Z23 Encounter for immunization: Secondary | ICD-10-CM | POA: Diagnosis not present

## 2013-11-22 IMAGING — MG MM DIGITAL SCREENING BILAT
5 series · 5 of 5 positions shown · non-contrast
Comparison: Previous exams

CLINICAL DATA: Screening.

DIGITAL SCREENING MAMMOGRAM WITH CAD

[L CC]
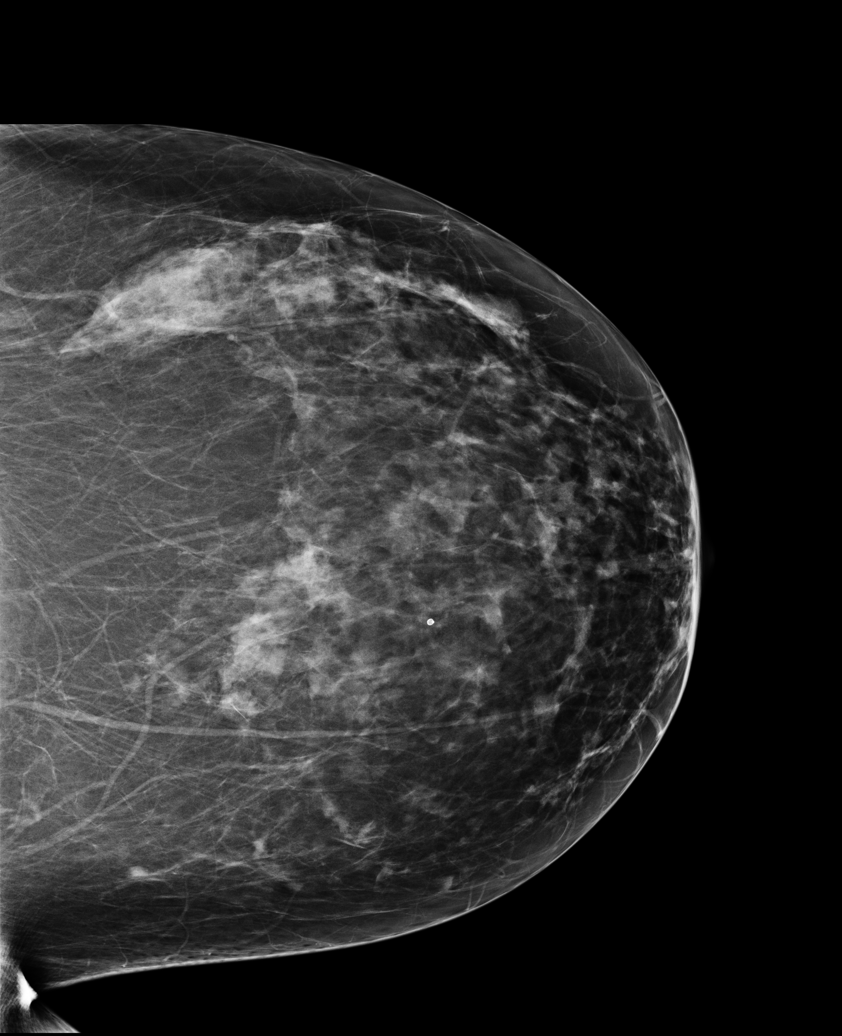

[L MLO]
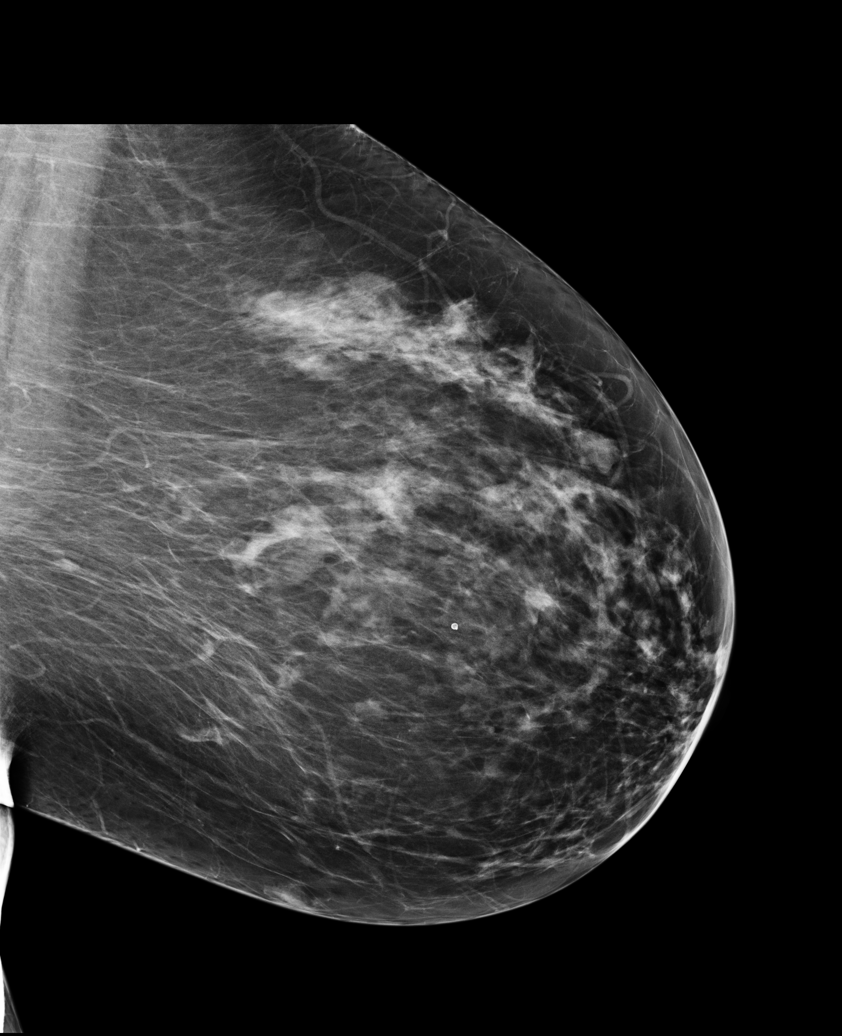

[R CC]
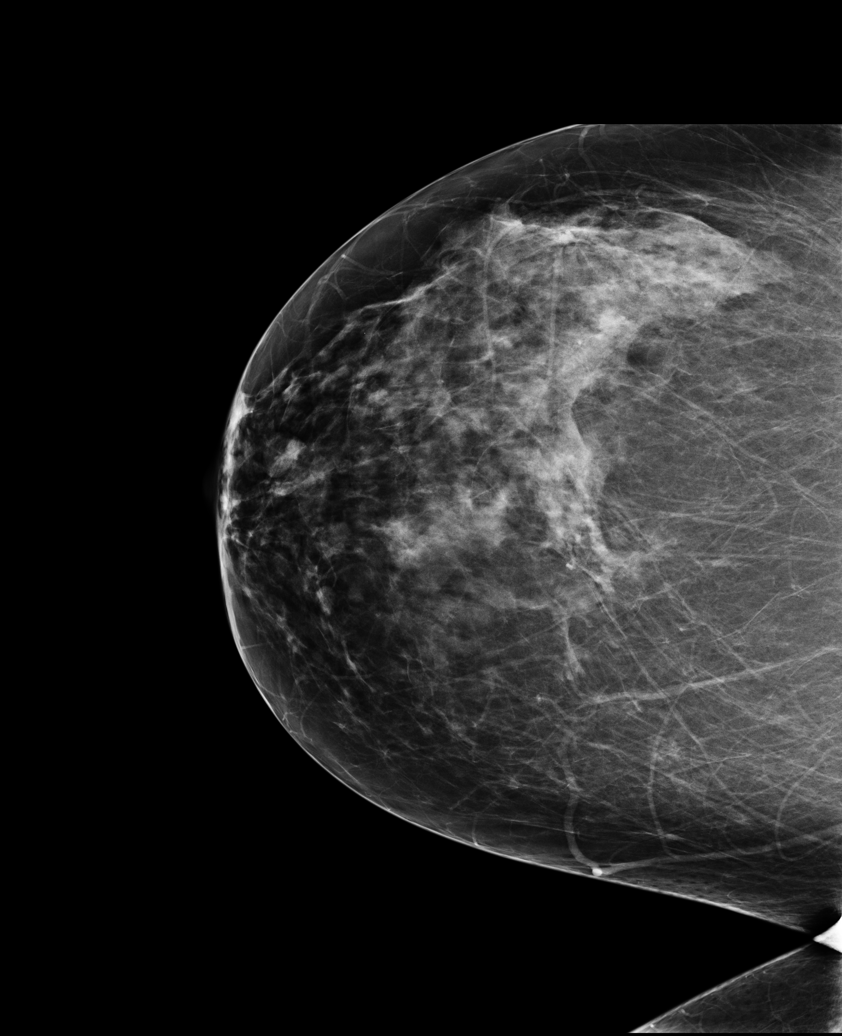

[R MLO (1 of 2)]
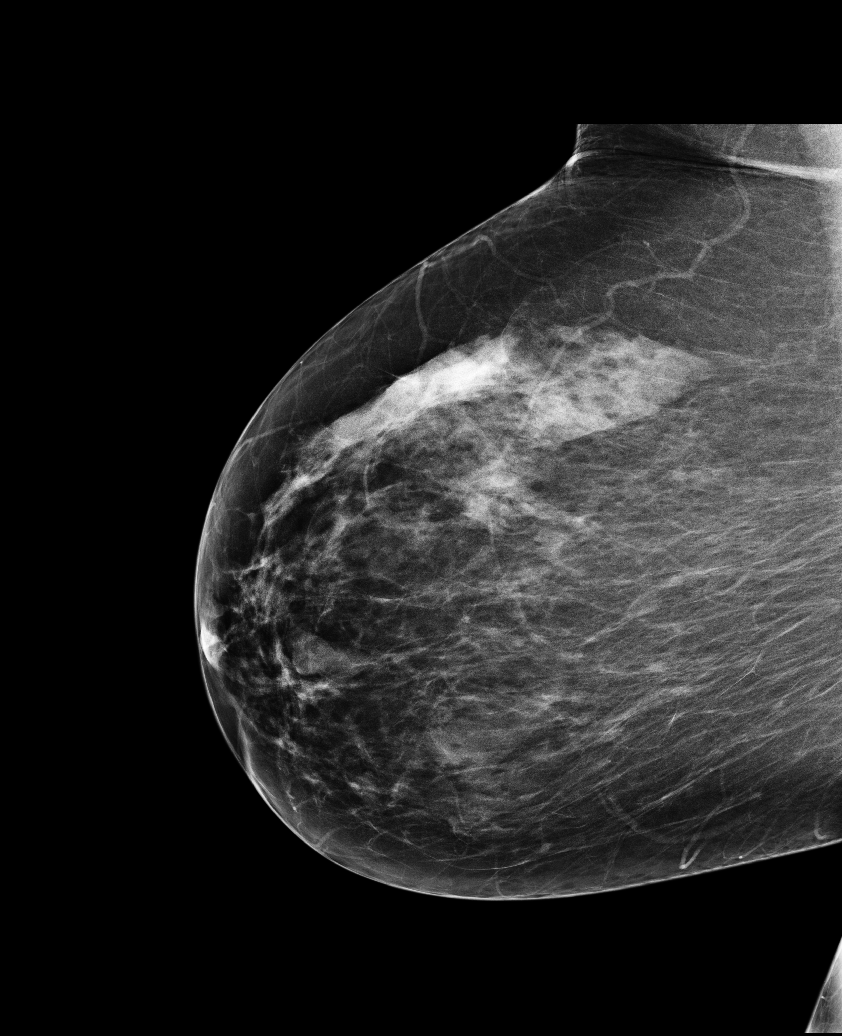

[R MLO (2 of 2)]
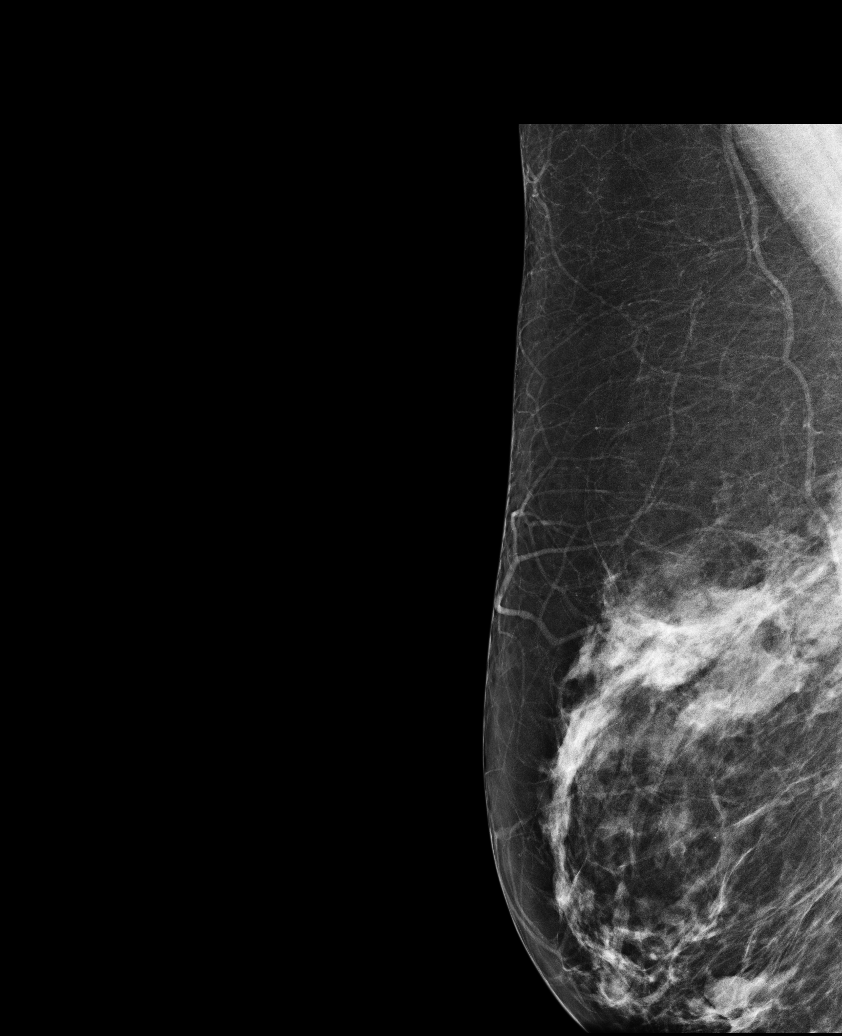

[5 of 5 positions shown; findings below may reference images not displayed]

FINDINGS: There are scattered fibroglandular densities. No
suspicious masses, architectural distortion, or calcifications are
present.

Images were processed with CAD.
IMPRESSION: No specific mammographic evidence of malignancy.

A result letter of this screening mammogram will be mailed directly
to the patient.

RECOMMENDATION:
Screening mammogram in one year. (Code:A7-G-7OZ)

BI-RADS CATEGORY 1:  Negative

## 2013-12-16 DIAGNOSIS — E785 Hyperlipidemia, unspecified: Secondary | ICD-10-CM | POA: Diagnosis not present

## 2013-12-16 DIAGNOSIS — I10 Essential (primary) hypertension: Secondary | ICD-10-CM | POA: Diagnosis not present

## 2013-12-16 DIAGNOSIS — Z79899 Other long term (current) drug therapy: Secondary | ICD-10-CM | POA: Diagnosis not present

## 2013-12-16 DIAGNOSIS — J449 Chronic obstructive pulmonary disease, unspecified: Secondary | ICD-10-CM | POA: Diagnosis not present

## 2013-12-23 DIAGNOSIS — Z0000 Encounter for general adult medical examination without abnormal findings: Secondary | ICD-10-CM | POA: Diagnosis not present

## 2013-12-23 DIAGNOSIS — I4949 Other premature depolarization: Secondary | ICD-10-CM | POA: Diagnosis not present

## 2014-02-18 ENCOUNTER — Other Ambulatory Visit: Payer: Self-pay | Admitting: Adult Health

## 2014-02-24 DIAGNOSIS — H2510 Age-related nuclear cataract, unspecified eye: Secondary | ICD-10-CM | POA: Diagnosis not present

## 2014-04-07 ENCOUNTER — Encounter: Payer: Self-pay | Admitting: Adult Health

## 2014-04-07 ENCOUNTER — Ambulatory Visit (INDEPENDENT_AMBULATORY_CARE_PROVIDER_SITE_OTHER): Payer: Medicare Other | Admitting: Adult Health

## 2014-04-07 VITALS — BP 118/70 | HR 76 | Ht 63.0 in | Wt 175.0 lb

## 2014-04-07 DIAGNOSIS — F17200 Nicotine dependence, unspecified, uncomplicated: Secondary | ICD-10-CM

## 2014-04-07 DIAGNOSIS — Z01419 Encounter for gynecological examination (general) (routine) without abnormal findings: Secondary | ICD-10-CM | POA: Diagnosis not present

## 2014-04-07 DIAGNOSIS — Z139 Encounter for screening, unspecified: Secondary | ICD-10-CM

## 2014-04-07 DIAGNOSIS — Z1212 Encounter for screening for malignant neoplasm of rectum: Secondary | ICD-10-CM

## 2014-04-07 HISTORY — DX: Nicotine dependence, unspecified, uncomplicated: F17.200

## 2014-04-07 MED ORDER — VARENICLINE TARTRATE 1 MG PO TABS: 1.0000 mg | ORAL_TABLET | Freq: Two times a day (BID) | ORAL | Status: DC

## 2014-04-07 MED ORDER — VARENICLINE TARTRATE 0.5 MG PO TABS: ORAL_TABLET | ORAL | Status: DC

## 2014-04-07 NOTE — Patient Instructions (Signed)
Physical in 2 years Mammogram yearly Colonoscopy referral made  Try chantix Smoking Cessation Quitting smoking is important to your health and has many advantages. However, it is not always easy to quit since nicotine is a very addictive drug. Often times, people try 3 times or more before being able to quit. This document explains the best ways for you to prepare to quit smoking. Quitting takes hard work and a lot of effort, but you can do it. ADVANTAGES OF QUITTING SMOKING  You will live longer, feel better, and live better.  Your body will feel the impact of quitting smoking almost immediately.  Within 20 minutes, blood pressure decreases. Your pulse returns to its normal level.  After 8 hours, carbon monoxide levels in the blood return to normal. Your oxygen level increases.  After 24 hours, the chance of having a heart attack starts to decrease. Your breath, hair, and body stop smelling like smoke.  After 48 hours, damaged nerve endings begin to recover. Your sense of taste and smell improve.  After 72 hours, the body is virtually free of nicotine. Your bronchial tubes relax and breathing becomes easier.  After 2 to 12 weeks, lungs can hold more air. Exercise becomes easier and circulation improves.  The risk of having a heart attack, stroke, cancer, or lung disease is greatly reduced.  After 1 year, the risk of coronary heart disease is cut in half.  After 5 years, the risk of stroke falls to the same as a nonsmoker.  After 10 years, the risk of lung cancer is cut in half and the risk of other cancers decreases significantly.  After 15 years, the risk of coronary heart disease drops, usually to the level of a nonsmoker.  If you are pregnant, quitting smoking will improve your chances of having a healthy baby.  The people you live with, especially any children, will be healthier.  You will have extra money to spend on things other than cigarettes. QUESTIONS TO THINK ABOUT  BEFORE ATTEMPTING TO QUIT You may want to talk about your answers with your caregiver.  Why do you want to quit?  If you tried to quit in the past, what helped and what did not?  What will be the most difficult situations for you after you quit? How will you plan to handle them?  Who can help you through the tough times? Your family? Friends? A caregiver?  What pleasures do you get from smoking? What ways can you still get pleasure if you quit? Here are some questions to ask your caregiver:  How can you help me to be successful at quitting?  What medicine do you think would be best for me and how should I take it?  What should I do if I need more help?  What is smoking withdrawal like? How can I get information on withdrawal? GET READY  Set a quit date.  Change your environment by getting rid of all cigarettes, ashtrays, matches, and lighters in your home, car, or work. Do not let people smoke in your home.  Review your past attempts to quit. Think about what worked and what did not. GET SUPPORT AND ENCOURAGEMENT You have a better chance of being successful if you have help. You can get support in many ways.  Tell your family, friends, and co-workers that you are going to quit and need their support. Ask them not to smoke around you.  Get individual, group, or telephone counseling and support. Programs are available  at local hospitals and health centers. Call your local health department for information about programs in your area.  Spiritual beliefs and practices may help some smokers quit.  Download a "quit meter" on your computer to keep track of quit statistics, such as how long you have gone without smoking, cigarettes not smoked, and money saved.  Get a self-help book about quitting smoking and staying off of tobacco. [place] yourself from urges to smoke. Talk to someone, go for a walk, or occupy your time with a task.  Change your  normal routine. Take a different route to work. Drink tea instead of coffee. Eat breakfast in a different place.  Reduce your stress. Take a hot bath, exercise, or read a book.  Plan something enjoyable to do every day. Reward yourself for not smoking.  Explore interactive web-based programs that specialize in helping you quit. GET MEDICINE AND USE IT CORRECTLY Medicines can help you stop smoking and decrease the urge to smoke. Combining medicine with the above behavioral methods and support can greatly increase your chances of successfully quitting smoking.  Nicotine replacement therapy helps deliver nicotine to your body without the negative effects and risks of smoking. Nicotine replacement therapy includes nicotine gum, lozenges, inhalers, nasal sprays, and skin patches. Some may be available over-the-counter and others require a prescription.  Antidepressant medicine helps people abstain from smoking, but how this works is unknown. This medicine is available by prescription.  Nicotinic receptor partial agonist medicine simulates the effect of nicotine in your brain. This medicine is available by prescription. Ask your caregiver for advice about which medicines to use and how to use them based on your health history. Your caregiver will tell you what side effects to look out for if you choose to be on a medicine or therapy. Carefully read the information on the package. Do not use any other product containing nicotine while using a nicotine replacement product.  RELAPSE OR DIFFICULT SITUATIONS Most relapses occur within the first 3 months after quitting. Do not be discouraged if you start smoking again. Remember, most people try several times before finally quitting. You may have symptoms of withdrawal because your body is used to nicotine. You may crave cigarettes, be irritable, feel very hungry, cough often, get headaches, or have difficulty concentrating. The withdrawal symptoms are only  temporary. They are strongest when you first quit, but they will go away within 10 14 days. To reduce the chances of relapse, try to:  Avoid drinking alcohol. Drinking lowers your chances of successfully quitting.  Reduce the amount of caffeine you consume. Once you quit smoking, the amount of caffeine in your body increases and can give you symptoms, such as a rapid heartbeat, sweating, and anxiety.  Avoid smokers because they can make you want to smoke.  Do not let weight gain distract you. Many smokers will gain weight when they quit, usually less than 10 pounds. Eat a healthy diet and stay active. You can always lose the weight gained after you quit.  Find ways to improve your mood other than smoking. FOR MORE INFORMATION  www.smokefree.gov  Document Released: 12/06/2001 Document Revised: 06/12/2012 Document Reviewed: 03/22/2012 [facility] Patient Information 2014 [place], [place].

## 2014-04-07 NOTE — Progress Notes (Signed)
Patient ID: [name], female   DOB: [birth year], 63 y.o.   MRN: [id number] History of Present Illness: [name] is a 63 year old white female in for a physical. Her husband has liver cancer and she is stressed and started back smoking but wants Chantix, used before with success.   Current Medications, Allergies, Past Medical History, Past Surgical History, Family History and Social History were reviewed in [employer] record.   Past Medical History  Diagnosis Date  . COPD (chronic obstructive pulmonary disease)   . CHF (congestive heart failure)   . Pneumonia   . Anxiety   . Hypertension   . Nicotine addiction 04/07/2014   Past Surgical History  Procedure Laterality Date  . Abdominal hysterectomy    . Cesarean section    Current outpatient prescriptions:albuterol (PROVENTIL HFA;VENTOLIN HFA) 108 (90 BASE) MCG/ACT inhaler, Inhale 2 puffs into the lungs every 6 (six) hours as needed. For shortness of breath  , Disp: , Rfl: ;  albuterol (PROVENTIL) (2.5 MG/3ML) 0.083% nebulizer solution, Take 2.5 mg by nebulization every 6 (six) hours as needed. For shortness of breath , Disp: , Rfl:  ALPRAZolam (XANAX) 1 MG tablet, Take 1 mg by mouth 3 (three) times daily as needed. For anxiety., Disp: , Rfl: ;  Fluticasone-Salmeterol (ADVAIR) 250-50 MCG/DOSE AEPB, Inhale 1 puff into the lungs 2 (two) times daily., Disp: 60 each, Rfl: 12;  HYDROcodone-acetaminophen (LORCET) 10-650 MG per tablet, Take 1 tablet by mouth 2 (two) times daily as needed. For pain., Disp: , Rfl:  losartan-hydrochlorothiazide (HYZAAR) 100-25 MG per tablet, Take 1 tablet by mouth daily., Disp: , Rfl: ;  Multiple Vitamin (MULTIVITAMIN WITH MINERALS) TABS, Take 1 tablet by mouth daily., Disp: , Rfl: ;  tiotropium (SPIRIVA) 18 MCG inhalation capsule, Place 18 mcg into inhaler and inhale daily.  , Disp: , Rfl: ;  varenicline (CHANTIX) 0.5 MG tablet, Take 1 daily x 3 days then 1 bid x 4 days then 2 bid, Disp: 59  tablet, Rfl: 0 varenicline (CHANTIX) 1 MG tablet, Take 1 tablet (1 mg total) by mouth 2 (two) times daily., Disp: 56 tablet, Rfl: 1  Review of Systems: Patient denies any headaches, blurred vision, shortness of breath, chest pain, abdominal pain, problems with bowel movements, urination, or intercourse. No joint pain or mood swings.    Physical Exam:BP 118/70  Pulse 76  Ht 5\' 3"  (1.6 m)  Wt 175 lb (79.379 kg)  BMI 31.01 kg/m2 General:  Well developed, well nourished, no acute distress, has O2 on Skin:  Warm and dry Neck:  Midline trachea, normal thyroid Lungs; Clear to auscultation bilaterally Breast:  No dominant palpable mass, retraction, or nipple discharge Cardiovascular: Regular rate and rhythm Abdomen:  Soft, non tender, no hepatosplenomegaly Pelvic:  External genitalia is normal in appearance.  The vagina is normal in appearance.the cervix and uterus are absent. No adnexal masses or tenderness noted. Rectal: Good sphincter tone, no polyps, or hemorrhoids felt.  Hemoccult negative. Extremities:  No swelling or varicosities noted Psych:  No mood changes, alert and cooperative, seems happy but has stress   Impression: Yearly gyn exam Nicotine addiction, again    Plan: Rx Chantix Physical in 1 year Mammogram yearly Referred to Dr Gala Romney for colonoscopy Review hand out on smoking cessation

## 2014-07-10 ENCOUNTER — Telehealth: Payer: Self-pay

## 2014-07-10 ENCOUNTER — Telehealth: Payer: Self-pay | Admitting: *Deleted

## 2014-07-10 NOTE — Telephone Encounter (Signed)
Pt received a letter back in April that it was time to schedule her colonoscopy. Please call her at [phone]. She has been taking care of her husband (cancer) and has been putting it off.

## 2014-07-10 NOTE — Telephone Encounter (Signed)
Pt's last colonoscopy was 10/19/2004 and Dr. Gala Romney recommended the next in 10 years ( which will be 09/2014). I will call pt later today.

## 2014-07-10 NOTE — Telephone Encounter (Signed)
Pt called stating she needs to schedule her colonoscopy pt will be out of the house today 11-12, per pt Dr. Gala Romney has done a colonoscopy on her 10 years ago. Please advise [phone]

## 2014-07-14 ENCOUNTER — Ambulatory Visit (INDEPENDENT_AMBULATORY_CARE_PROVIDER_SITE_OTHER): Payer: Medicare Other | Admitting: Gastroenterology

## 2014-07-14 ENCOUNTER — Telehealth: Payer: Self-pay

## 2014-07-14 ENCOUNTER — Encounter (INDEPENDENT_AMBULATORY_CARE_PROVIDER_SITE_OTHER): Payer: Self-pay

## 2014-07-14 ENCOUNTER — Ambulatory Visit (HOSPITAL_COMMUNITY)
Admission: RE | Admit: 2014-07-14 | Discharge: 2014-07-14 | Disposition: A | Discharge status: Home / Self Care | Payer: Medicare Other | Source: Ambulatory Visit | Attending: Gastroenterology | Admitting: Gastroenterology

## 2014-07-14 VITALS — BP 143/80 | HR 88 | Temp 98.7°F | Resp 18 | Ht 63.0 in | Wt 181.0 lb

## 2014-07-14 DIAGNOSIS — R141 Gas pain: Secondary | ICD-10-CM | POA: Insufficient documentation

## 2014-07-14 DIAGNOSIS — R143 Flatulence: Principal | ICD-10-CM

## 2014-07-14 DIAGNOSIS — R142 Eructation: Principal | ICD-10-CM

## 2014-07-14 DIAGNOSIS — K5900 Constipation, unspecified: Secondary | ICD-10-CM | POA: Diagnosis not present

## 2014-07-14 LAB — BASIC METABOLIC PANEL
BUN: 10 mg/dL (ref 6–23)
CHLORIDE: 93 meq/L — AB (ref 96–112)
CO2: 33 meq/L — AB (ref 19–32)
CREATININE: 0.59 mg/dL (ref 0.50–1.10)
Calcium: 8.7 mg/dL (ref 8.4–10.5)
GLUCOSE: 99 mg/dL (ref 70–99)
POTASSIUM: 4 meq/L (ref 3.5–5.3)
Sodium: 134 mEq/L — ABNORMAL LOW (ref 135–145)

## 2014-07-14 LAB — TSH: TSH: 0.865 u[IU]/mL (ref 0.350–4.500)

## 2014-07-14 MED ORDER — LINACLOTIDE 145 MCG PO CAPS
145.0000 ug | ORAL_CAPSULE | Freq: Every day | ORAL | Status: DC
Start: 2014-07-14 — End: 2014-08-22

## 2014-07-14 MED ORDER — PEG 3350-KCL-NA BICARB-NACL 420 G PO SOLR: 4000.0000 mL | ORAL | Status: DC

## 2014-07-14 NOTE — Telephone Encounter (Signed)
Pt called to get x-ray results so she will know when to start her Linzess.

## 2014-07-14 NOTE — Patient Instructions (Signed)
1. Start Linzess 141mcg daily on empty stomach daily. Wait until we have called with your xray results.  2. Plan for colonoscopy in the near future. See separate instructions.  3. Please have your labs done.  4. Please have your xray done today.

## 2014-07-14 NOTE — Progress Notes (Signed)
Abdominal films came back without any evidence of blockage. Discussed with patient. Okay to start Triumph. Await labs. TCS as planned.

## 2014-07-14 NOTE — Progress Notes (Signed)
Quick Note:  No evidence of blockage. Please let patient know to start her Linzess once daily on empty stomach. TCS as planned. Await labs. ______

## 2014-07-14 NOTE — Progress Notes (Signed)
Primary Care Physician:  Asencion Noble, MD  Primary Gastroenterologist:  Garfield Cornea, MD   Chief Complaint  Patient presents with  . Constipation    HPI:  [name] is a 63 y.o. female here for same-day appointment. She called with complaints of significant bowel change. We last saw her at time of colonoscopy in October 2005. See below for details of procedure. Over the past few weeks she's had a gradual change in bowel habits. She is noted going from a daily bowel movement to requiring straining to pass a teaspoon full of stool. Started taking stool softners, no relief. Started Miralax powder, for three days and no BM. Started getting very bloated, afraid to eat. Diminished oral intake for 2 weeks because of this. Took bottle Magnesium Citrate and no relief. Finally took enema last Thursday. Some relief. Having only teapsoon of stool since then. Sore on left abdomen. Eats Activia daily. Gallon of water daily. Has been eating cooked veggies and soup only. No n/v. +flatus. No heartburn issues. No dysphagia. Has been on chronic hydrocodone but never caused constipation before.   Current Outpatient Prescriptions  Medication Sig Dispense Refill  . albuterol (PROVENTIL HFA;VENTOLIN HFA) 108 (90 BASE) MCG/ACT inhaler Inhale 2 puffs into the lungs every 6 (six) hours as needed. For shortness of breath        . albuterol (PROVENTIL) (2.5 MG/3ML) 0.083% nebulizer solution Take 2.5 mg by nebulization every 6 (six) hours as needed. For shortness of breath       . ALPRAZolam (XANAX) 1 MG tablet Take 1 mg by mouth 3 (three) times daily as needed. For anxiety.      . Fluticasone-Salmeterol (ADVAIR) 250-50 MCG/DOSE AEPB Inhale 1 puff into the lungs 2 (two) times daily.  60 each  12  . HYDROcodone-acetaminophen (LORCET) 10-650 MG per tablet Take 1 tablet by mouth 2 (two) times daily as needed. For pain.      Marland Kitchen losartan-hydrochlorothiazide (HYZAAR) 100-25 MG per tablet Take 1 tablet by mouth daily.      .  Multiple Vitamin (MULTIVITAMIN WITH MINERALS) TABS Take 1 tablet by mouth daily.      Marland Kitchen tiotropium (SPIRIVA) 18 MCG inhalation capsule Place 18 mcg into inhaler and inhale daily.         No current facility-administered medications for this visit.    Allergies as of 07/14/2014  . (No Known Allergies)    Past Medical History  Diagnosis Date  . COPD (chronic obstructive pulmonary disease)   . CHF (congestive heart failure)   . Pneumonia   . Anxiety   . Hypertension   . Nicotine addiction 04/07/2014    Past Surgical History  Procedure Laterality Date  . Abdominal hysterectomy    . Cesarean section    . Colonoscopy  10/19/2004    Dr. Gala Romney- internal hemorrhoids, o/w normal rectum, normal colon    Family History  Problem Relation Age of Onset  . Adopted: Yes  . Cancer Sister     pancreatic  . Migraines Daughter   . Cancer Daughter     pre cancerous cells on cervix  . Cancer Sister     breast cancer  . Colon cancer Neg Hx     History   Social History  . Marital Status: Single    Spouse Name: N/A    Number of Children: N/A  . Years of Education: N/A   Occupational History  . Not on file.   Social History Main Topics  . Smoking status:  Former Smoker -- 78 years    Quit date: 04/14/2013  . Smokeless tobacco: Never Used  . Alcohol Use: No  . Drug Use: No  . Sexual Activity: No   Other Topics Concern  . Not on file   Social History Narrative  . No narrative on file      ROS:  General: Negative for anorexia, weight loss, fever, chills, fatigue, weakness. Eyes: Negative for vision changes.  ENT: Negative for hoarseness, difficulty swallowing , nasal congestion. CV: Negative for chest pain, angina, palpitations, dyspnea on exertion, peripheral edema.  Respiratory: Negative for dyspnea at rest, dyspnea on exertion, cough, sputum, wheezing.  GI: See history of present illness. GU:  Negative for dysuria, hematuria, urinary incontinence, urinary frequency,  nocturnal urination.  MS: Negative for joint pain.+ low back pain.  Derm: Negative for rash or itching.  Neuro: Negative for weakness, abnormal sensation, seizure, frequent headaches, memory loss, confusion.  Psych: Negative for anxiety, depression, suicidal ideation, hallucinations.  Endo: Negative for unusual weight change.  Heme: Negative for bruising or bleeding. Allergy: Negative for rash or hives.    Physical Examination:  BP 143/80  Pulse 88  Temp(Src) 98.7 F (37.1 C) (Oral)  Resp 18  Ht 5\' 3"  (1.6 m)  Wt 181 lb (82.101 kg)  BMI 32.07 kg/m2   General: Well-nourished, well-developed in no acute distress. Anxious Head: Normocephalic, atraumatic.   Eyes: Conjunctiva pink, no icterus. Mouth: Oropharyngeal mucosa moist and pink , no lesions erythema or exudate. Neck: Supple without thyromegaly, masses, or lymphadenopathy.  Lungs: Clear to auscultation bilaterally.  Heart: Regular rate and rhythm, no murmurs rubs or gallops.  Abdomen: Bowel sounds are normal, diffuse left-sided discomfort, mild distention, no hepatosplenomegaly or masses, no abdominal bruits or    hernia , no rebound or guarding.   Rectal: Not performed Extremities: No lower extremity edema. No clubbing or deformities.  Neuro: Alert and oriented x 4 , grossly normal neurologically.  Skin: Warm and dry, no rash or jaundice.   Psych: Alert and cooperative, normal mood and affect.

## 2014-07-14 NOTE — Telephone Encounter (Signed)
Pt called to schedule colonoscopy. Said she is getting worried about herself she is having constipation. We had cancellation this morning and She is coming in to see Neil Crouch, PA at 9:30 AM.

## 2014-07-14 NOTE — Telephone Encounter (Signed)
Patient aware.

## 2014-07-14 NOTE — Assessment & Plan Note (Addendum)
63 y/o lady who presents for same day appointment for significant bowel change over the course of a couple weeks. Describes intractable constipation. She is due for her 10 year colonoscopy as well. Chronically on hydrocodone but previously has not had any issues with bowel function. We'll check thyroid status, calcium level. For abdominal distention and significant constipation will check abdominal film today to assess stool load rule out blockage. Assuming abdominal film okay, we'll start Linzess 145 mcg daily without food. Samples and voucher provided. Plan on colonoscopy within next few weeks once constipation managed.  I have discussed the risks, alternatives, benefits with regards to but not limited to the risk of reaction to medication, bleeding, infection, perforation and the patient is agreeable to proceed. Written consent to be obtained.

## 2014-07-15 LAB — CBC WITH DIFFERENTIAL/PLATELET
BASOS ABS: 0.1 10*3/uL (ref 0.0–0.1)
Basophils Relative: 1 % (ref 0–1)
EOS PCT: 0 % (ref 0–5)
Eosinophils Absolute: 0 10*3/uL (ref 0.0–0.7)
HEMATOCRIT: 35.2 % — AB (ref 36.0–46.0)
HEMOGLOBIN: 11.6 g/dL — AB (ref 12.0–15.0)
LYMPHS ABS: 4.9 10*3/uL — AB (ref 0.7–4.0)
LYMPHS PCT: 61 % — AB (ref 12–46)
MCH: 29 pg (ref 26.0–34.0)
MCHC: 33 g/dL (ref 30.0–36.0)
MCV: 88 fL (ref 78.0–100.0)
MONO ABS: 1.1 10*3/uL — AB (ref 0.1–1.0)
MONOS PCT: 14 % — AB (ref 3–12)
Neutro Abs: 1.9 10*3/uL (ref 1.7–7.7)
Neutrophils Relative %: 24 % — ABNORMAL LOW (ref 43–77)
Platelets: 140 10*3/uL — ABNORMAL LOW (ref 150–400)
RBC: 4 MIL/uL (ref 3.87–5.11)
RDW: 14.1 % (ref 11.5–15.5)
WBC: 8 10*3/uL (ref 4.0–10.5)

## 2014-07-15 NOTE — Progress Notes (Signed)
Quick Note:  Please let patient know her thyroid checked out ok. She is mildly anemic, platelet count slightly low, lymphs up. Please find out if she has had any recent infections?  Proceed with TCS as planned. Recheck CBC in 10 days. I want this labs back before I leave out of town 7/31. ______

## 2014-07-16 ENCOUNTER — Encounter (HOSPITAL_COMMUNITY): Payer: Self-pay | Admitting: Pharmacy Technician

## 2014-07-21 ENCOUNTER — Other Ambulatory Visit: Payer: Self-pay | Admitting: Gastroenterology

## 2014-07-21 ENCOUNTER — Telehealth: Payer: Self-pay

## 2014-07-21 ENCOUNTER — Other Ambulatory Visit: Payer: Self-pay

## 2014-07-21 DIAGNOSIS — D649 Anemia, unspecified: Secondary | ICD-10-CM | POA: Diagnosis not present

## 2014-07-21 NOTE — Telephone Encounter (Signed)
Looks like she has not been informed of labs results. Sent result note 07/15/14.   Please find out info on result note.  She can try OTC Zantac 150mg  BID prn. I will touch base with her tomorrow and we can decide about EGD then.

## 2014-07-21 NOTE — Telephone Encounter (Signed)
Pt is aware of her lab results (see result note) and these recommendations,  she said she will be home tomorrow after 1pm. She said she would appreciate a call if possible.

## 2014-07-21 NOTE — Telephone Encounter (Signed)
Pt called- she has been having severe episodes of heartburn every since she has been taking the linzess. Yesterday she was sick on her stomach and had diarrhea yesterday. Had dark stools after taking pepto. Has been  feeling worse d/t the heartburn. Pt stated she has never had heartburn before.  Pt wants to know if she can have an EGD when she has her tcs?  Pt is also requesting a phone call from [facility] if possible. [phone]) She wants to know what direction she needs to go in.

## 2014-07-22 ENCOUNTER — Other Ambulatory Visit: Payer: Self-pay | Admitting: Internal Medicine

## 2014-07-22 DIAGNOSIS — K2100 Gastro-esophageal reflux disease with esophagitis, without bleeding: Secondary | ICD-10-CM

## 2014-07-22 DIAGNOSIS — K5900 Constipation, unspecified: Secondary | ICD-10-CM

## 2014-07-22 NOTE — Progress Notes (Signed)
cc'd to pcp 

## 2014-07-22 NOTE — Telephone Encounter (Signed)
DONE SAME DAY SAME TIME

## 2014-07-22 NOTE — Telephone Encounter (Signed)
Spoke with patient (15 mins). Doesn't recall having viral infection but has bad COPD and allergies. Discussed abnormal CBC. She is also worried, about couple of hours after meals, food regurgitates into mouth, undigested. Chewing food thoroughly. Happening over the past couple of months. No dysphagia. Heartburn worse, new over the past couple of months. Forgot to mention at [facility]. Had her CBC done today.   Please add on EGD to TCS due to recent onset and worsening GERD/regurgitation.  Currently scheduled on 07/30/14, patient cannot do 8/3 or 8/6 if days have to be changed.

## 2014-07-23 LAB — CBC WITH DIFFERENTIAL/PLATELET
Basophils Absolute: 0.1 10*3/uL (ref 0.0–0.1)
Basophils Relative: 1 % (ref 0–1)
Eosinophils Absolute: 0 10*3/uL (ref 0.0–0.7)
Eosinophils Relative: 0 % (ref 0–5)
HCT: 32.9 % — ABNORMAL LOW (ref 36.0–46.0)
Hemoglobin: 10.8 g/dL — ABNORMAL LOW (ref 12.0–15.0)
LYMPHS PCT: 72 % — AB (ref 12–46)
Lymphs Abs: 5.5 10*3/uL — ABNORMAL HIGH (ref 0.7–4.0)
MCH: 29.1 pg (ref 26.0–34.0)
MCHC: 32.8 g/dL (ref 30.0–36.0)
MCV: 88.7 fL (ref 78.0–100.0)
Monocytes Absolute: 1.1 10*3/uL — ABNORMAL HIGH (ref 0.1–1.0)
Monocytes Relative: 14 % — ABNORMAL HIGH (ref 3–12)
NEUTROS PCT: 13 % — AB (ref 43–77)
Neutro Abs: 1 10*3/uL — ABNORMAL LOW (ref 1.7–7.7)
PLATELETS: 147 10*3/uL — AB (ref 150–400)
RBC: 3.71 MIL/uL — AB (ref 3.87–5.11)
RDW: 14.4 % (ref 11.5–15.5)
WBC: 7.7 10*3/uL (ref 4.0–10.5)

## 2014-07-23 NOTE — Progress Notes (Signed)
Quick Note:  Her lymphocytes continue to be elevated, with mild anemia, thrombocytopenia. For precautionary reasons, I suggest she be evaluated by hematology. Please see if they can see her within 1-2 weeks. ______

## 2014-07-28 ENCOUNTER — Other Ambulatory Visit: Payer: Self-pay | Admitting: Gastroenterology

## 2014-07-28 DIAGNOSIS — D509 Iron deficiency anemia, unspecified: Secondary | ICD-10-CM

## 2014-07-30 ENCOUNTER — Telehealth: Payer: Self-pay

## 2014-07-30 ENCOUNTER — Encounter (HOSPITAL_COMMUNITY)
Admission: RE | Disposition: A | Discharge status: Home / Self Care | Payer: Self-pay | Source: Ambulatory Visit | Attending: Internal Medicine

## 2014-07-30 ENCOUNTER — Encounter (HOSPITAL_COMMUNITY): Payer: Self-pay

## 2014-07-30 ENCOUNTER — Ambulatory Visit (HOSPITAL_COMMUNITY)
Admission: RE | Admit: 2014-07-30 | Discharge: 2014-07-30 | Disposition: A | Discharge status: Home / Self Care | Payer: Medicare Other | Source: Ambulatory Visit | Attending: Internal Medicine | Admitting: Internal Medicine

## 2014-07-30 DIAGNOSIS — Z79899 Other long term (current) drug therapy: Secondary | ICD-10-CM | POA: Diagnosis not present

## 2014-07-30 DIAGNOSIS — F411 Generalized anxiety disorder: Secondary | ICD-10-CM | POA: Diagnosis not present

## 2014-07-30 DIAGNOSIS — Z87891 Personal history of nicotine dependence: Secondary | ICD-10-CM | POA: Diagnosis not present

## 2014-07-30 DIAGNOSIS — D126 Benign neoplasm of colon, unspecified: Secondary | ICD-10-CM | POA: Insufficient documentation

## 2014-07-30 DIAGNOSIS — I10 Essential (primary) hypertension: Secondary | ICD-10-CM | POA: Diagnosis not present

## 2014-07-30 DIAGNOSIS — Z86010 Personal history of colonic polyps: Secondary | ICD-10-CM | POA: Diagnosis not present

## 2014-07-30 DIAGNOSIS — K2100 Gastro-esophageal reflux disease with esophagitis, without bleeding: Secondary | ICD-10-CM

## 2014-07-30 DIAGNOSIS — K5900 Constipation, unspecified: Secondary | ICD-10-CM | POA: Diagnosis not present

## 2014-07-30 DIAGNOSIS — R1110 Vomiting, unspecified: Secondary | ICD-10-CM | POA: Insufficient documentation

## 2014-07-30 DIAGNOSIS — J449 Chronic obstructive pulmonary disease, unspecified: Secondary | ICD-10-CM | POA: Diagnosis not present

## 2014-07-30 DIAGNOSIS — J4489 Other specified chronic obstructive pulmonary disease: Secondary | ICD-10-CM | POA: Insufficient documentation

## 2014-07-30 DIAGNOSIS — K5730 Diverticulosis of large intestine without perforation or abscess without bleeding: Secondary | ICD-10-CM | POA: Diagnosis not present

## 2014-07-30 DIAGNOSIS — R198 Other specified symptoms and signs involving the digestive system and abdomen: Secondary | ICD-10-CM | POA: Insufficient documentation

## 2014-07-30 HISTORY — PX: COLONOSCOPY: SHX5424

## 2014-07-30 HISTORY — PX: ESOPHAGOGASTRODUODENOSCOPY: SHX5428

## 2014-07-30 SURGERY — COLONOSCOPY
Anesthesia: Moderate Sedation

## 2014-07-30 MED ORDER — ONDANSETRON HCL 4 MG/2ML IJ SOLN
INTRAMUSCULAR | Status: AC
  Filled 2014-07-30: qty 2

## 2014-07-30 MED ORDER — SODIUM CHLORIDE 0.9 % IV SOLN
INTRAVENOUS | Status: DC
  Administered 2014-07-30: 11:00:00 via INTRAVENOUS

## 2014-07-30 MED ORDER — MIDAZOLAM HCL 5 MG/5ML IJ SOLN
INTRAMUSCULAR | Status: AC
  Filled 2014-07-30: qty 10

## 2014-07-30 MED ORDER — MEPERIDINE HCL 100 MG/ML IJ SOLN
INTRAMUSCULAR | Status: AC
  Filled 2014-07-30: qty 2

## 2014-07-30 MED ORDER — MEPERIDINE HCL 100 MG/ML IJ SOLN
INTRAMUSCULAR | Status: DC | PRN
  Administered 2014-07-30 (×3): 25 mg via INTRAVENOUS
  Administered 2014-07-30: 50 mg via INTRAVENOUS

## 2014-07-30 MED ORDER — MIDAZOLAM HCL 5 MG/5ML IJ SOLN
INTRAMUSCULAR | Status: DC | PRN
  Administered 2014-07-30: 1 mg via INTRAVENOUS
  Administered 2014-07-30: 2 mg via INTRAVENOUS
  Administered 2014-07-30 (×4): 1 mg via INTRAVENOUS

## 2014-07-30 MED ORDER — STERILE WATER FOR IRRIGATION IR SOLN
Status: DC | PRN
  Administered 2014-07-30: 11:00:00

## 2014-07-30 MED ORDER — ONDANSETRON HCL 4 MG/2ML IJ SOLN
INTRAMUSCULAR | Status: DC | PRN
  Administered 2014-07-30: 4 mg via INTRAVENOUS

## 2014-07-30 MED ORDER — LIDOCAINE VISCOUS 2 % MT SOLN
OROMUCOSAL | Status: DC | PRN
  Administered 2014-07-30 (×2): 2 mL via OROMUCOSAL

## 2014-07-30 MED ORDER — LIDOCAINE VISCOUS 2 % MT SOLN
OROMUCOSAL | Status: AC
  Filled 2014-07-30: qty 15

## 2014-07-30 NOTE — Telephone Encounter (Signed)
thanks

## 2014-07-30 NOTE — Telephone Encounter (Signed)
Pt was sent by from Endo by Dr. Gala Romney for samples of Frederica. Dexilant 60 mg #20 given to take one daily.

## 2014-07-30 NOTE — Op Note (Signed)
[facility] [address] [place], [address]   COLONOSCOPY PROCEDURE REPORT  PATIENT: [name], [name]  MR#:         [id number] BIRTHDATE: 05/20/1951 , 62  yrs. old GENDER: Female ENDOSCOPIST: R.  Garfield Cornea, MD FACP FACG REFERRED BY:  Asencion Noble, M.D. PROCEDURE DATE:  07/30/2014 PROCEDURE:     Colonoscopy with snare polypectomy  INDICATIONS: Change in bowel habits  INFORMED CONSENT:  The risks, benefits, alternatives and imponderables including but not limited to bleeding, perforation as well as the possibility of a missed lesion have been reviewed.  The potential for biopsy, lesion removal, etc. have also been discussed.  Questions have been answered.  All parties agreeable. Please see the history and physical in the medical record for more information.  MEDICATIONS: Versed 7 mg IV and Demerol 125 mg IV in divided doses. Zofran 4 mg IV  DESCRIPTION OF PROCEDURE:  After a digital rectal exam was performed, the EC-[id number] ([id number])  colonoscope was advanced from the anus through the rectum and colon to the area of the cecum, ileocecal valve and appendiceal orifice.  The cecum was deeply intubated.  These structures were well-seen and photographed for the record.  From the level of the cecum and ileocecal valve, the scope was slowly and cautiously withdrawn.  The mucosal surfaces were carefully surveyed utilizing scope tip deflection to facilitate fold flattening as needed.  The scope was pulled down into the rectum where a thorough examination including retroflexion was performed.    FINDINGS:  Adequate preparation. Normal rectum. (1) 7 mm polyp in the distal sigmoid segment; patient also had scattered sigmoid diverticula. The remainder of colonic mucosa appeared normal.  THERAPEUTIC / DIAGNOSTIC MANEUVERS PERFORMED:  The above-mentioned polyp was hot snare removed.  COMPLICATIONS: none  CECAL WITHDRAWAL TIME:  9 minutes  IMPRESSION:  Colonic  diverticulosis. Colonic polyp-removed as described above  RECOMMENDATIONS: Continue Linzess daily. Add Benefiber daily.Followup on pathology. See EGD report.   _______________________________ eSigned:  R. Garfield Cornea, MD FACP [facility] 07/30/2014 12:17 PM   CC:    PATIENT NAME:  [name], [name] MR#: [id number]

## 2014-07-30 NOTE — Op Note (Signed)
[facility] [address] [place], [address]   ENDOSCOPY PROCEDURE REPORT  PATIENT: Jean, Hanson  MR#: [id number] BIRTHDATE: [birth year] , 62  yrs. old GENDER: Female ENDOSCOPIST: R.  Garfield Cornea, MD FACP FACG REFERRED BY:  Asencion Noble, M.D. PROCEDURE DATE:  07/30/2014 PROCEDURE:     Diagnostic EGD  INDICATIONS:     Regurgitation  INFORMED CONSENT:   The risks, benefits, limitations, alternatives and imponderables have been discussed.  The potential for biopsy, esophogeal dilation, etc. have also been reviewed.  Questions have been answered.  All parties agreeable.  Please see the history and physical in the medical record for more information.  MEDICATIONS:  Versed 4 mg and Demerol 100 mg IV in divided doses. Xylocaine gel orally. Zofran 4 mg IV.  DESCRIPTION OF PROCEDURE:   The EG-[id number] ([id number])  endoscope was introduced through the mouth and advanced to the second portion of the duodenum without difficulty or limitations.  The mucosal surfaces were surveyed very carefully during advancement of the scope and upon withdrawal.  Retroflexion view of the proximal stomach and esophagogastric junction was performed.      FINDINGS: Normal esophagus. Stomach empty. Normal gastric mucosa. No hiatal hernia. Patent pylorus. Normal first and second portion of the duodenum.  THERAPEUTIC / DIAGNOSTIC MANEUVERS PERFORMED:  none   COMPLICATIONS:  None  IMPRESSION:   Normal EGD.  An element of GERD remains a possibility. Also, a narcotic-induced GI motility disorder may also be playing a role. Doubt rumination syndrome.  RECOMMENDATIONS:  Course of Dexilant 60 mg daily. See colonoscopy report.    _______________________________ R. Garfield Cornea, MD FACP [facility] eSigned:  R. Garfield Cornea, MD FACP [facility] 07/30/2014 11:58 AM     CC:  PATIENT NAME:  Jean, Hanson MR#: [id number]

## 2014-07-30 NOTE — Discharge Instructions (Signed)
Colonoscopy Discharge Instructions  Read the instructions outlined below and refer to this sheet in the next few weeks. These discharge instructions provide you with general information on caring for yourself after you leave the hospital. Your doctor may also give you specific instructions. While your treatment has been planned according to the most current medical practices available, unavoidable complications occasionally occur. If you have any problems or questions after discharge, call Dr. Gala Romney at [phone]. ACTIVITY  You may resume your regular activity, but move at a slower pace for the next 24 hours.   Take frequent rest periods for the next 24 hours.   Walking will help get rid of the air and reduce the bloated feeling in your belly (abdomen).   No driving for 24 hours (because of the medicine (anesthesia) used during the test).    Do not sign any important legal documents or operate any machinery for 24 hours (because of the anesthesia used during the test).  NUTRITION  Drink plenty of fluids.   You may resume your normal diet as instructed by your doctor.   Begin with a light meal and progress to your normal diet. Heavy or fried foods are harder to digest and may make you feel sick to your stomach (nauseated).   Avoid alcoholic beverages for 24 hours or as instructed.  MEDICATIONS  You may resume your normal medications unless your doctor tells you otherwise.  WHAT YOU CAN EXPECT TODAY  Some feelings of bloating in the abdomen.   Passage of more gas than usual.   Spotting of blood in your stool or on the toilet paper.  IF YOU HAD POLYPS REMOVED DURING THE COLONOSCOPY:  No aspirin products for 7 days or as instructed.   No alcohol for 7 days or as instructed.   Eat a soft diet for the next 24 hours.  FINDING OUT THE RESULTS OF YOUR TEST Not all test results are available during your visit. If your test results are not back during the visit, make an appointment  with your caregiver to find out the results. Do not assume everything is normal if you have not heard from your caregiver or the medical facility. It is important for you to follow up on all of your test results.  SEEK IMMEDIATE MEDICAL ATTENTION IF:  You have more than a spotting of blood in your stool.   Your belly is swollen (abdominal distention).   You are nauseated or vomiting.   You have a temperature over 101.  You have abdominal pain or discomfort that is severe or gets worse throughout the day. EGD Discharge instructions Please read the instructions outlined below and refer to this sheet in the next few weeks. These discharge instructions provide you with general information on caring for yourself after you leave the hospital. Your doctor may also give you specific instructions. While your treatment has been planned according to the most current medical practices available, unavoidable complications occasionally occur. If you have any problems or questions after discharge, please call your doctor. ACTIVITY You may resume your regular activity but move at a slower pace for the next 24 hours.  Take frequent rest periods for the next 24 hours.  Walking will help expel (get rid of) the air and reduce the bloated feeling in your abdomen.  No driving for 24 hours (because of the anesthesia (medicine) used during the test).  You may shower.  Do not sign any important legal documents or operate any machinery for 24  hours (because of the anesthesia used during the test).  NUTRITION Drink plenty of fluids.  You may resume your normal diet.  Begin with a light meal and progress to your normal diet.  Avoid alcoholic beverages for 24 hours or as instructed by your caregiver.  MEDICATIONS You may resume your normal medications unless your caregiver tells you otherwise.  WHAT YOU CAN EXPECT TODAY You may experience abdominal discomfort such as a feeling of fullness or gas pains.   FOLLOW-UP Your doctor will discuss the results of your test with you.  SEEK IMMEDIATE MEDICAL ATTENTION IF ANY OF THE FOLLOWING OCCUR: Excessive nausea (feeling sick to your stomach) and/or vomiting.  Severe abdominal pain and distention (swelling).  Trouble swallowing.  Temperature over 101 F (37.8 C).  Rectal bleeding or vomiting of blood.   GERD, diverticulosis and constipation information provided  Begin Dexilant 60 mg daily  -   go by my office for a three-week supply of samples.  Continue Linzess145 daily  Add Benefiber 2 teaspoons twice daily to your regimen  Further recommendations to follow pending review of pathology report       Constipation Constipation is when a person:  Poops (has a bowel movement) less than 3 times a week.  Has a hard time pooping.  Has poop that is dry, hard, or bigger than normal. HOME CARE   Eat foods with a lot of fiber in them. This includes fruits, vegetables, beans, and whole grains such as brown rice.  Avoid fatty foods and foods with a lot of sugar. This includes french fries, hamburgers, cookies, candy, and soda.  If you are not getting enough fiber from food, take products with added fiber in them (supplements).  Drink enough fluid to keep your pee (urine) clear or pale yellow.  Exercise on a regular basis, or as told by your doctor.  Go to the restroom when you feel like you need to poop. Do not hold it.  Only take medicine as told by your doctor. Do not take medicines that help you poop (laxatives) without talking to your doctor first. GET HELP RIGHT AWAY IF:   You have bright red blood in your poop (stool).  Your constipation lasts more than 4 days or gets worse.  You have belly (abdominal) or butt (rectal) pain.  You have thin poop (as thin as a pencil).  You lose weight, and it cannot be explained. MAKE SURE YOU:   Understand these instructions.  Will watch your condition.  Will get help right away if  you are not doing well or get worse. Document Released: 05/30/2008 Document Revised: 12/17/2013 Document Reviewed: 09/23/2013 [facility] Patient Information 2015 [place], [place]. This information is not intended to replace advice given to you by your health care provider. Make sure you discuss any questions you have with your health care provider. Diverticulosis Diverticulosis is the condition that develops when small pouches (diverticula) form in the wall of your colon. Your colon, or large intestine, is where water is absorbed and stool is formed. The pouches form when the inside layer of your colon pushes through weak spots in the outer layers of your colon. CAUSES  No one knows exactly what causes diverticulosis. RISK FACTORS  Being older than 55. Your risk for this condition increases with age. Diverticulosis is rare in people younger than 40 years. By age 74, almost everyone has it.  Eating a low-fiber diet.  Being frequently constipated.  Being overweight.  Not getting enough exercise.  Smoking.  Taking over-the-counter pain medicines, like aspirin and ibuprofen. SYMPTOMS  Most people with diverticulosis do not have symptoms. DIAGNOSIS  Because diverticulosis often has no symptoms, health care providers often discover the condition during an exam for other colon problems. In many cases, a health care provider will diagnose diverticulosis while using a flexible scope to examine the colon (colonoscopy). TREATMENT  If you have never developed an infection related to diverticulosis, you may not need treatment. If you have had an infection before, treatment may include:  Eating more fruits, vegetables, and grains.  Taking a fiber supplement.  Taking a live bacteria supplement (probiotic).  Taking medicine to relax your colon. HOME CARE INSTRUCTIONS   Drink at least 6-8 glasses of water each day to prevent constipation.  Try not to strain when you have a bowel movement.  Keep  all follow-up appointments. If you have had an infection before:  Increase the fiber in your diet as directed by your health care provider or dietitian.  Take a dietary fiber supplement if your health care provider approves.  Only take medicines as directed by your health care provider. SEEK MEDICAL CARE IF:   You have abdominal pain.  You have bloating.  You have cramps.  You have not gone to the bathroom in 3 days. SEEK IMMEDIATE MEDICAL CARE IF:   Your pain gets worse.  Yourbloating becomes very bad.  You have a fever or chills, and your symptoms suddenly get worse.  You begin vomiting.  You have bowel movements that are bloody or black. MAKE SURE YOU:  Understand these instructions.  Will watch your condition.  Will get help right away if you are not doing well or get worse. Document Released: 09/08/2004 Document Revised: 12/17/2013 Document Reviewed: 11/06/2013 [facility] Patient Information 2015 [place], [place]. This information is not intended to replace advice given to you by your health care provider. Make sure you discuss any questions you have with your health care provider. Gastroesophageal Reflux Disease, Adult Gastroesophageal reflux disease (GERD) happens when acid from your stomach flows up into the esophagus. When acid comes in contact with the esophagus, the acid causes soreness (inflammation) in the esophagus. Over time, GERD may create small holes (ulcers) in the lining of the esophagus. CAUSES  Increased body weight. This puts pressure on the stomach, making acid rise from the stomach into the esophagus. Smoking. This increases acid production in the stomach. Drinking alcohol. This causes decreased pressure in the lower esophageal sphincter (valve or ring of muscle between the esophagus and stomach), allowing acid from the stomach into the esophagus. Late evening meals and a full stomach. This increases pressure and acid production in the stomach. A  malformed lower esophageal sphincter. Sometimes, no cause is found. SYMPTOMS  Burning pain in the lower part of the mid-chest behind the breastbone and in the mid-stomach area. This may occur twice a week or more often. Trouble swallowing. Sore throat. Dry cough. Asthma-like symptoms including chest tightness, shortness of breath, or wheezing. DIAGNOSIS  Your caregiver may be able to diagnose GERD based on your symptoms. In some cases, X-rays and other tests may be done to check for complications or to check the condition of your stomach and esophagus. TREATMENT  Your caregiver may recommend over-the-counter or prescription medicines to help decrease acid production. Ask your caregiver before starting or adding any new medicines.  HOME CARE INSTRUCTIONS  Change the factors that you can control. Ask your caregiver for guidance concerning weight loss, quitting smoking,  and alcohol consumption. Avoid foods and drinks that make your symptoms worse, such as: Caffeine or alcoholic drinks. Chocolate. Peppermint or mint flavorings. Garlic and onions. Spicy foods. Citrus fruits, such as oranges, lemons, or limes. Tomato-based foods such as sauce, chili, salsa, and pizza. Fried and fatty foods. Avoid lying down for the 3 hours prior to your bedtime or prior to taking a nap. Eat small, frequent meals instead of large meals. Wear loose-fitting clothing. Do not wear anything tight around your waist that causes pressure on your stomach. Raise the head of your bed 6 to 8 inches with wood blocks to help you sleep. Extra pillows will not help. Only take over-the-counter or prescription medicines for pain, discomfort, or fever as directed by your caregiver. Do not take aspirin, ibuprofen, or other nonsteroidal anti-inflammatory drugs (NSAIDs). SEEK IMMEDIATE MEDICAL CARE IF:  You have pain in your arms, neck, jaw, teeth, or back. Your pain increases or changes in intensity or duration. You develop  nausea, vomiting, or sweating (diaphoresis). You develop shortness of breath, or you faint. Your vomit is green, yellow, black, or looks like coffee grounds or blood. Your stool is red, bloody, or black. These symptoms could be signs of other problems, such as heart disease, gastric bleeding, or esophageal bleeding. MAKE SURE YOU:  Understand these instructions. Will watch your condition. Will get help right away if you are not doing well or get worse. Document Released: 09/21/2005 Document Revised: 03/05/2012 Document Reviewed: 07/01/2011 [facility] Patient Information 2015 [place], [place]. This information is not intended to replace advice given to you by your health care provider. Make sure you discuss any questions you have with your health care provider.

## 2014-07-30 NOTE — Interval H&P Note (Signed)
History and Physical Interval Note:  07/30/2014 11:31 AM  [name]  has presented today for surgery, with the diagnosis of UNSPECIFIED CONSTIPATION, GERD  The various methods of treatment have been discussed with the patient and family. After consideration of risks, benefits and other options for treatment, the patient has consented to  Procedure(s) with comments: COLONOSCOPY (N/A) - 1:15 ESOPHAGOGASTRODUODENOSCOPY (EGD) (N/A) as a surgical intervention .  The patient's history has been reviewed, patient examined, no change in status, stable for surgery.  I have reviewed the patient's chart and labs.  Questions were answered to the patient's satisfaction.     [name]  No change. EGD and colonoscopy per plan. No dysphagia. No blood per rectum.The risks, benefits, limitations, imponderables and alternatives regarding both EGD and colonoscopy have been reviewed with the patient. Questions have been answered. All parties agreeable.

## 2014-07-30 NOTE — H&P (View-Only) (Signed)
Primary Care Physician:  Asencion Noble, MD  Primary Gastroenterologist:  Garfield Cornea, MD   Chief Complaint  Patient presents with  . Constipation    HPI:  [name] is a 63 y.o. female here for same-day appointment. She called with complaints of significant bowel change. We last saw her at time of colonoscopy in October 2005. See below for details of procedure. Over the past few weeks she's had a gradual change in bowel habits. She is noted going from a daily bowel movement to requiring straining to pass a teaspoon full of stool. Started taking stool softners, no relief. Started Miralax powder, for three days and no BM. Started getting very bloated, afraid to eat. Diminished oral intake for 2 weeks because of this. Took bottle Magnesium Citrate and no relief. Finally took enema last Thursday. Some relief. Having only teapsoon of stool since then. Sore on left abdomen. Eats Activia daily. Gallon of water daily. Has been eating cooked veggies and soup only. No n/v. +flatus. No heartburn issues. No dysphagia. Has been on chronic hydrocodone but never caused constipation before.   Current Outpatient Prescriptions  Medication Sig Dispense Refill  . albuterol (PROVENTIL HFA;VENTOLIN HFA) 108 (90 BASE) MCG/ACT inhaler Inhale 2 puffs into the lungs every 6 (six) hours as needed. For shortness of breath        . albuterol (PROVENTIL) (2.5 MG/3ML) 0.083% nebulizer solution Take 2.5 mg by nebulization every 6 (six) hours as needed. For shortness of breath       . ALPRAZolam (XANAX) 1 MG tablet Take 1 mg by mouth 3 (three) times daily as needed. For anxiety.      . Fluticasone-Salmeterol (ADVAIR) 250-50 MCG/DOSE AEPB Inhale 1 puff into the lungs 2 (two) times daily.  60 each  12  . HYDROcodone-acetaminophen (LORCET) 10-650 MG per tablet Take 1 tablet by mouth 2 (two) times daily as needed. For pain.      Marland Kitchen losartan-hydrochlorothiazide (HYZAAR) 100-25 MG per tablet Take 1 tablet by mouth daily.      .  Multiple Vitamin (MULTIVITAMIN WITH MINERALS) TABS Take 1 tablet by mouth daily.      Marland Kitchen tiotropium (SPIRIVA) 18 MCG inhalation capsule Place 18 mcg into inhaler and inhale daily.         No current facility-administered medications for this visit.    Allergies as of 07/14/2014  . (No Known Allergies)    Past Medical History  Diagnosis Date  . COPD (chronic obstructive pulmonary disease)   . CHF (congestive heart failure)   . Pneumonia   . Anxiety   . Hypertension   . Nicotine addiction 04/07/2014    Past Surgical History  Procedure Laterality Date  . Abdominal hysterectomy    . Cesarean section    . Colonoscopy  10/19/2004    Dr. Gala Romney- internal hemorrhoids, o/w normal rectum, normal colon    Family History  Problem Relation Age of Onset  . Adopted: Yes  . Cancer Sister     pancreatic  . Migraines Daughter   . Cancer Daughter     pre cancerous cells on cervix  . Cancer Sister     breast cancer  . Colon cancer Neg Hx     History   Social History  . Marital Status: Single    Spouse Name: N/A    Number of Children: N/A  . Years of Education: N/A   Occupational History  . Not on file.   Social History Main Topics  . Smoking status:  Former Smoker -- 28 years    Quit date: 04/14/2013  . Smokeless tobacco: Never Used  . Alcohol Use: No  . Drug Use: No  . Sexual Activity: No   Other Topics Concern  . Not on file   Social History Narrative  . No narrative on file      ROS:  General: Negative for anorexia, weight loss, fever, chills, fatigue, weakness. Eyes: Negative for vision changes.  ENT: Negative for hoarseness, difficulty swallowing , nasal congestion. CV: Negative for chest pain, angina, palpitations, dyspnea on exertion, peripheral edema.  Respiratory: Negative for dyspnea at rest, dyspnea on exertion, cough, sputum, wheezing.  GI: See history of present illness. GU:  Negative for dysuria, hematuria, urinary incontinence, urinary frequency,  nocturnal urination.  MS: Negative for joint pain.+ low back pain.  Derm: Negative for rash or itching.  Neuro: Negative for weakness, abnormal sensation, seizure, frequent headaches, memory loss, confusion.  Psych: Negative for anxiety, depression, suicidal ideation, hallucinations.  Endo: Negative for unusual weight change.  Heme: Negative for bruising or bleeding. Allergy: Negative for rash or hives.    Physical Examination:  BP 143/80  Pulse 88  Temp(Src) 98.7 F (37.1 C) (Oral)  Resp 18  Ht 5\' 3"  (1.6 m)  Wt 181 lb (82.101 kg)  BMI 32.07 kg/m2   General: Well-nourished, well-developed in no acute distress. Anxious Head: Normocephalic, atraumatic.   Eyes: Conjunctiva pink, no icterus. Mouth: Oropharyngeal mucosa moist and pink , no lesions erythema or exudate. Neck: Supple without thyromegaly, masses, or lymphadenopathy.  Lungs: Clear to auscultation bilaterally.  Heart: Regular rate and rhythm, no murmurs rubs or gallops.  Abdomen: Bowel sounds are normal, diffuse left-sided discomfort, mild distention, no hepatosplenomegaly or masses, no abdominal bruits or    hernia , no rebound or guarding.   Rectal: Not performed Extremities: No lower extremity edema. No clubbing or deformities.  Neuro: Alert and oriented x 4 , grossly normal neurologically.  Skin: Warm and dry, no rash or jaundice.   Psych: Alert and cooperative, normal mood and affect.

## 2014-07-31 ENCOUNTER — Encounter: Payer: Self-pay | Admitting: Internal Medicine

## 2014-08-04 ENCOUNTER — Encounter (HOSPITAL_COMMUNITY): Payer: Self-pay | Admitting: Internal Medicine

## 2014-08-12 ENCOUNTER — Encounter (HOSPITAL_COMMUNITY): Payer: Self-pay

## 2014-08-12 ENCOUNTER — Encounter (HOSPITAL_COMMUNITY): Payer: Medicare Other | Attending: Hematology and Oncology

## 2014-08-12 VITALS — BP 141/53 | HR 74 | Temp 98.0°F | Resp 18 | Ht 63.0 in | Wt 178.3 lb

## 2014-08-12 DIAGNOSIS — K5730 Diverticulosis of large intestine without perforation or abscess without bleeding: Secondary | ICD-10-CM | POA: Diagnosis not present

## 2014-08-12 DIAGNOSIS — D72820 Lymphocytosis (symptomatic): Secondary | ICD-10-CM | POA: Diagnosis not present

## 2014-08-12 DIAGNOSIS — Z87891 Personal history of nicotine dependence: Secondary | ICD-10-CM | POA: Diagnosis not present

## 2014-08-12 DIAGNOSIS — J449 Chronic obstructive pulmonary disease, unspecified: Secondary | ICD-10-CM | POA: Diagnosis not present

## 2014-08-12 DIAGNOSIS — D696 Thrombocytopenia, unspecified: Secondary | ICD-10-CM | POA: Insufficient documentation

## 2014-08-12 DIAGNOSIS — F411 Generalized anxiety disorder: Secondary | ICD-10-CM | POA: Diagnosis not present

## 2014-08-12 DIAGNOSIS — Z9981 Dependence on supplemental oxygen: Secondary | ICD-10-CM | POA: Diagnosis not present

## 2014-08-12 DIAGNOSIS — I10 Essential (primary) hypertension: Secondary | ICD-10-CM | POA: Insufficient documentation

## 2014-08-12 DIAGNOSIS — R5383 Other fatigue: Secondary | ICD-10-CM | POA: Diagnosis not present

## 2014-08-12 DIAGNOSIS — K589 Irritable bowel syndrome without diarrhea: Secondary | ICD-10-CM | POA: Diagnosis not present

## 2014-08-12 DIAGNOSIS — R5381 Other malaise: Secondary | ICD-10-CM | POA: Insufficient documentation

## 2014-08-12 DIAGNOSIS — Z90711 Acquired absence of uterus with remaining cervical stump: Secondary | ICD-10-CM | POA: Insufficient documentation

## 2014-08-12 DIAGNOSIS — H5440 Blindness, one eye, unspecified eye: Secondary | ICD-10-CM | POA: Insufficient documentation

## 2014-08-12 DIAGNOSIS — I509 Heart failure, unspecified: Secondary | ICD-10-CM | POA: Diagnosis not present

## 2014-08-12 DIAGNOSIS — Z79899 Other long term (current) drug therapy: Secondary | ICD-10-CM | POA: Diagnosis not present

## 2014-08-12 DIAGNOSIS — D7289 Other specified disorders of white blood cells: Secondary | ICD-10-CM | POA: Diagnosis not present

## 2014-08-12 DIAGNOSIS — D649 Anemia, unspecified: Secondary | ICD-10-CM | POA: Insufficient documentation

## 2014-08-12 DIAGNOSIS — J4489 Other specified chronic obstructive pulmonary disease: Secondary | ICD-10-CM | POA: Diagnosis not present

## 2014-08-12 NOTE — Patient Instructions (Signed)
[facility] Discharge Instructions  RECOMMENDATIONS MADE BY THE CONSULTANT AND ANY TEST RESULTS WILL BE SENT TO YOUR REFERRING PHYSICIAN.  EXAM FINDINGS BY THE PHYSICIAN TODAY AND SIGNS OR SYMPTOMS TO REPORT TO CLINIC OR PRIMARY PHYSICIAN: You saw Dr Barnet Glasgow today  You will return tomorrow to have your lab work drawn.  Follow up in 2 weeks.  Chronic Lymphocytic leukemia    Thank you for choosing [facility] to provide your oncology and hematology care.  To afford each patient quality time with our providers, please arrive at least 15 minutes before your scheduled appointment time.  With your help, our goal is to use those 15 minutes to complete the necessary work-up to ensure our physicians have the information they need to help with your evaluation and healthcare recommendations.    Effective January 1st, 2014, we ask that you re-schedule your appointment with our physicians should you arrive 10 or more minutes late for your appointment.  We strive to give you quality time with our providers, and arriving late affects you and other patients whose appointments are after yours.    Again, thank you for choosing [facility].  Our hope is that these requests will decrease the amount of time that you wait before being seen by our physicians.       _____________________________________________________________  Should you have questions after your visit to [facility], please contact our office at [phone] between the hours of 8:30 a.m. and 5:00 p.m.  Voicemails left after 4:30 p.m. will not be returned until the following business day.  For prescription refill requests, have your pharmacy contact our office with your prescription refill request.

## 2014-08-12 NOTE — Progress Notes (Signed)
[place] A. Barnet Glasgow, M.D.  NEW PATIENT EVALUATION   Name: [name] Date: 08/13/2014 MRN: [id number] DOB: 1951-02-23  PCP: Asencion Noble, MD   REFERRING PHYSICIAN: Mahala Menghini, PA-C  REASON FOR REFERRAL: Thrombocytopenia, anemia, and lymphocytosis.     HISTORY OF PRESENT ILLNESS:[name] is a 63 y.o. female who is referred because of abnormal CBC with anemia, thrombocytopenia, and lymphocytosis. Patient did smoke one pack of cigarettes daily over one year ago and is currently oxygen dependent. She has been more fatigued of late particularly within the last few months since she has been taking her fianc to [place], [place] for TACE treatment for primary liver cancer. She denies easy satiety, fever, night sweats, worsening cough, hemoptysis, melena, hematochezia, hematuria, vaginal bleeding or discharge, incontinence, lower extremity swelling or redness, PND, orthopnea, palpitations, sore throat, joint pain, headache, or seizures. 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   PAST MEDICAL HISTORY:  has a past medical history of COPD (chronic obstructive pulmonary disease); CHF (congestive heart failure); Pneumonia; Anxiety; Hypertension; and Nicotine addiction (04/07/2014).     PAST SURGICAL HISTORY: Past Surgical History  Procedure Laterality Date  . Abdominal hysterectomy    . Cesarean section    . Colonoscopy  10/19/2004    Dr. Gala Romney- internal hemorrhoids, o/w normal rectum, normal colon  . Colonoscopy N/A 07/30/2014    Procedure: COLONOSCOPY;  Surgeon: Daneil Dolin, MD;  Location: [facility];  Service: Endoscopy;  Laterality: N/A;  1:15  . Esophagogastroduodenoscopy N/A 07/30/2014    Procedure: ESOPHAGOGASTRODUODENOSCOPY (EGD);  Surgeon: Daneil Dolin, MD;  Location: [facility];  Service: Endoscopy;  Laterality: N/A;     CURRENT  MEDICATIONS: has a current medication list which includes the following prescription(s): albuterol, albuterol, alprazolam, fluticasone-salmeterol, hydrocodone-acetaminophen, linaclotide, losartan-hydrochlorothiazide, multivitamin with minerals, polyethylene glycol-electrolytes, and tiotropium.   ALLERGIES: Review of patient's allergies indicates no known allergies.   SOCIAL HISTORY:  reports that she quit smoking about 15 months ago. She has never used smokeless tobacco. She reports that she does not drink alcohol or use illicit drugs.   FAMILY HISTORY: family history includes Cancer in her daughter, sister, and sister; Migraines in her daughter. There is no history of Colon cancer. She was adopted.    REVIEW OF SYSTEMS:  Other than that discussed above is noncontributory.    PHYSICAL EXAM:  height is 5\' 3"  (1.6 m) and weight is 178 lb 4.8 oz (80.876 kg). Her oral temperature is 98 F (36.7 C). Her blood pressure is 141/53 and her pulse is 74. Her respiration is 18.    GENERAL:alert, no distress and comfortable. Oxygen in place. SKIN: skin color, texture, turgor are normal, no rashes or significant lesions EYES:  Conjunctiva are pink and non-injected, sclera clear. Right wandering eye with no reaction to light. OROPHARYNX:no exudate, no erythema and lips, buccal mucosa, and tongue normal  NECK: supple, thyroid normal size, non-tender, without nodularity CHEST: Increased AP diameter with no breast masses. LYMPH:  no palpable lymphadenopathy in the cervical, axillary or inguinal LUNGS: clear to auscultation and percussion with normal breathing effort HEART: regular rate & rhythm and no murmurs ABDOMEN:abdomen soft, non-tender and normal bowel sounds. Soft with no organomegaly, ascites, or CVA tenderness. MUSCULOSKELETALl:no cyanosis of digits, no clubbing or edema  NEURO: alert & oriented x 3 with fluent speech, no focal  motor/sensory deficits    LABORATORY DATA:  Orders Only on  07/21/2014  Component Date Value Ref Range Status  . WBC 07/21/2014 7.7  4.0 - 10.5 K/uL Final  . RBC 07/21/2014 3.71* 3.87 - 5.11 MIL/uL Final  . Hemoglobin 07/21/2014 10.8* 12.0 - 15.0 g/dL Final  . HCT 07/21/2014 32.9* 36.0 - 46.0 % Final  . MCV 07/21/2014 88.7  78.0 - 100.0 fL Final  . MCH 07/21/2014 29.1  26.0 - 34.0 pg Final  . MCHC 07/21/2014 32.8  30.0 - 36.0 g/dL Final  . RDW 07/21/2014 14.4  11.5 - 15.5 % Final  . Platelets 07/21/2014 147* 150 - 400 K/uL Final  . Neutrophils Relative % 07/21/2014 13* 43 - 77 % Final  . Neutro Abs 07/21/2014 1.0* 1.7 - 7.7 K/uL Final  . Lymphocytes Relative 07/21/2014 72* 12 - 46 % Final  . Lymphs Abs 07/21/2014 5.5* 0.7 - 4.0 K/uL Final  . Monocytes Relative 07/21/2014 14* 3 - 12 % Final  . Monocytes Absolute 07/21/2014 1.1* 0.1 - 1.0 K/uL Final  . Eosinophils Relative 07/21/2014 0  0 - 5 % Final  . Eosinophils Absolute 07/21/2014 0.0  0.0 - 0.7 K/uL Final  . Basophils Relative 07/21/2014 1  0 - 1 % Final  . Basophils Absolute 07/21/2014 0.1  0.0 - 0.1 K/uL Final  . Smear Review 07/21/2014 SEE NOTE   Final   Comment:                            RBC, Platelet and WBC Morphology unremarkable  Office Visit on 07/14/2014  Component Date Value Ref Range Status  . WBC 07/14/2014 8.0  4.0 - 10.5 K/uL Final  . RBC 07/14/2014 4.00  3.87 - 5.11 MIL/uL Final  . Hemoglobin 07/14/2014 11.6* 12.0 - 15.0 g/dL Final  . HCT 07/14/2014 35.2* 36.0 - 46.0 % Final  . MCV 07/14/2014 88.0  78.0 - 100.0 fL Final  . MCH 07/14/2014 29.0  26.0 - 34.0 pg Final  . MCHC 07/14/2014 33.0  30.0 - 36.0 g/dL Final  . RDW 07/14/2014 14.1  11.5 - 15.5 % Final  . Platelets 07/14/2014 140* 150 - 400 K/uL Final  . Neutrophils Relative % 07/14/2014 24* 43 - 77 % Final  . Neutro Abs 07/14/2014 1.9  1.7 - 7.7 K/uL Final  . Lymphocytes Relative 07/14/2014 61* 12 - 46 % Final  . Lymphs Abs 07/14/2014 4.9* 0.7 - 4.0 K/uL Final  . Monocytes Relative 07/14/2014 14* 3 - 12 % Final    . Monocytes Absolute 07/14/2014 1.1* 0.1 - 1.0 K/uL Final  . Eosinophils Relative 07/14/2014 0  0 - 5 % Final  . Eosinophils Absolute 07/14/2014 0.0  0.0 - 0.7 K/uL Final  . Basophils Relative 07/14/2014 1  0 - 1 % Final  . Basophils Absolute 07/14/2014 0.1  0.0 - 0.1 K/uL Final  . Smear Review 07/14/2014 SEE NOTE   Final   Comment: Atypical lymphs.                          Polychromasia present                          Platelets are unremarkable.  . Sodium 07/14/2014 134* 135 - 145 mEq/L Final  . Potassium 07/14/2014 4.0  3.5 - 5.3 mEq/L Final  . Chloride 07/14/2014 93* 96 -  112 mEq/L Final  . CO2 07/14/2014 33* 19 - 32 mEq/L Final  . Glucose, Bld 07/14/2014 99  70 - 99 mg/dL Final  . BUN 07/14/2014 10  6 - 23 mg/dL Final  . Creat 07/14/2014 0.59  0.50 - 1.10 mg/dL Final  . Calcium 07/14/2014 8.7  8.4 - 10.5 mg/dL Final  . TSH 07/14/2014 0.865  0.350 - 4.500 uIU/mL Final    Urinalysis    Component Value Date/Time   COLORURINE YELLOW 08/17/2011 1302   APPEARANCEUR CLEAR 08/17/2011 1302   LABSPEC 1.015 08/17/2011 1302   PHURINE 6.0 08/17/2011 1302   GLUCOSEU NEGATIVE 08/17/2011 1302   HGBUR TRACE* 08/17/2011 1302   BILIRUBINUR NEGATIVE 08/17/2011 1302   KETONESUR NEGATIVE 08/17/2011 1302   PROTEINUR NEGATIVE 08/17/2011 1302   UROBILINOGEN 0.2 08/17/2011 1302   NITRITE NEGATIVE 08/17/2011 1302   LEUKOCYTESUR NEGATIVE 08/17/2011 1302      @RADIOGRAPHY : Dg Abd 2 Views  07/14/2014   CLINICAL DATA:  Abdominal distension, constipation  EXAM: ABDOMEN - 2 VIEW  COMPARISON:  03/20/2013.  FINDINGS: There is nonspecific nonobstructive bowel gas pattern. No free abdominal air. Some colonic gas noted in right colon. Mild degenerative changes lumbar spine.  IMPRESSION: Nonspecific nonobstructive bowel gas pattern. Some colonic gas noted in right colon.   Electronically Signed   By: Lahoma Crocker M.D.   On: 07/14/2014 12:54   EGD (07/30/2014): [facility] [address] [place],  [address] ENDOSCOPY PROCEDURE REPORT PATIENT: Adaline, Trejos MR#: [id number] BIRTHDATE: 02-04-1951 , 62 yrs. old GENDER: Female ENDOSCOPIST: R. Garfield Cornea, MD FACP FACG REFERRED BY: Asencion Noble, M.D. PROCEDURE DATE: 07/30/2014  IMPRESSION: Normal EGD. An element of GERD remains a possibility. Also, a narcotic-induced GI motility disorder may also be playing a role. Doubt rumination syndrome. RECOMMENDATIONS: Course of Dexilant  COLONOSCOPY (07/30/2014):  [facility] [address] [place], [address] COLONOSCOPY PROCEDURE REPORT PATIENT: Mykaylah, Ballman MR#: [id number] BIRTHDATE: August 05, 1951 , 62 yrs. old GENDER: Female ENDOSCOPIST: R. Garfield Cornea, MD FACP FACG REFERRED BY: Asencion Noble, M.D. PROCEDURE DATE: 07/30/2014 PROCEDURE: Colonoscopy with snare polypectomy es IMPRESSION: Colonic diverticulosis. Colonic polyp-removed as described above RECOMMENDATIONS: Continue Linzess daily. Add Benefiber daily.Followup on pathology. See EGD report. _______________________________ eSigned: R. Garfield Cornea, MD [facility]   PATHOLOGY:  for KHRISTINE, VERNO ([id number]) Patient: LATONDA, LARRIVEE Collected: 07/30/2014 Client: [facility] Accession: [id number] Received: 07/30/2014 Manus Rudd DOB: Sep 09, 1951 Age: 21 Gender: F Reported: 07/31/2014 [address] Patient Ph: [phone] MRN #: [id number] Linna [place] [address] Visit #: [id number] Chart #: Phone: [phone] Fax: CC: REPORT OF SURGICAL PATHOLOGAGNOSIS Diagnosis Colon, polyp(s), sigmoid - TUBULAR ADENOMA. - NO HIGH GRADE DYSPLASIA OR MALIGNANCY. Vicente Males MD Pathologist, Electronic Signature (Case signed 07/31/2014) Specimen Gross and Clinical Information Specimen(s) Obtained: Colon, polyp(s), sigmoid Specimen Clinical Information unspecified constipation, GERD, sigmoid colon polyp (kp) Gross Received in formalin is a tan, soft tissue fragment that is submitted in toto. Size: 0.5 cm. One  block submitted. (GP:ds 07/30/14) Report signed out from the following location(s) Technical Component and Interpretation performed at [place], [place], [place] [address]. CLIA #:     IMPRESSION:  #1. Probable chronic lymphocytic leukemia with anemia and thrombocytopenia. #2. Chronic obstructive pulmonary disease. #3. Right eye blindness. #4. Diverticulosis. #5. Irritable bowel syndrome, constipating type. #6. Hypertension, on treatment.   PLAN:  #1. Additional lab tests were done today including peripheral blood flow cytometry. #2. Followup in 2  weeks with CBC  I appreciate the opportunity of sharing in her care.   Doroteo Bradford, MD 08/13/2014 8:22 AM   DISCLAIMER:  This note was dictated with voice recognition softwre.  Similar sounding words can inadvertently be transcribed inaccurately and may not be corrected upon review.

## 2014-08-13 ENCOUNTER — Encounter (HOSPITAL_BASED_OUTPATIENT_CLINIC_OR_DEPARTMENT_OTHER): Payer: Medicare Other

## 2014-08-13 DIAGNOSIS — J449 Chronic obstructive pulmonary disease, unspecified: Secondary | ICD-10-CM | POA: Diagnosis not present

## 2014-08-13 DIAGNOSIS — F411 Generalized anxiety disorder: Secondary | ICD-10-CM | POA: Diagnosis not present

## 2014-08-13 DIAGNOSIS — D696 Thrombocytopenia, unspecified: Secondary | ICD-10-CM | POA: Diagnosis not present

## 2014-08-13 DIAGNOSIS — I10 Essential (primary) hypertension: Secondary | ICD-10-CM | POA: Diagnosis not present

## 2014-08-13 DIAGNOSIS — D72820 Lymphocytosis (symptomatic): Secondary | ICD-10-CM | POA: Diagnosis not present

## 2014-08-13 LAB — CBC WITH DIFFERENTIAL/PLATELET
Basophils Absolute: 0 10*3/uL (ref 0.0–0.1)
Basophils Relative: 1 % (ref 0–1)
EOS ABS: 0.4 10*3/uL (ref 0.0–0.7)
Eosinophils Relative: 6 % — ABNORMAL HIGH (ref 0–5)
HEMATOCRIT: 37.1 % (ref 36.0–46.0)
HEMOGLOBIN: 11.8 g/dL — AB (ref 12.0–15.0)
LYMPHS ABS: 2.6 10*3/uL (ref 0.7–4.0)
Lymphocytes Relative: 38 % (ref 12–46)
MCH: 29.4 pg (ref 26.0–34.0)
MCHC: 31.8 g/dL (ref 30.0–36.0)
MCV: 92.5 fL (ref 78.0–100.0)
MONO ABS: 0.8 10*3/uL (ref 0.1–1.0)
MONOS PCT: 11 % (ref 3–12)
Neutro Abs: 3.1 10*3/uL (ref 1.7–7.7)
Neutrophils Relative %: 44 % (ref 43–77)
Platelets: 182 10*3/uL (ref 150–400)
RBC: 4.01 MIL/uL (ref 3.87–5.11)
RDW: 13.1 % (ref 11.5–15.5)
WBC: 6.9 10*3/uL (ref 4.0–10.5)

## 2014-08-13 LAB — RETICULOCYTES
RBC.: 4.01 MIL/uL (ref 3.87–5.11)
Retic Count, Absolute: 48.1 10*3/uL (ref 19.0–186.0)
Retic Ct Pct: 1.2 % (ref 0.4–3.1)

## 2014-08-13 LAB — COMPREHENSIVE METABOLIC PANEL
ALBUMIN: 4 g/dL (ref 3.5–5.2)
ALT: 20 U/L (ref 0–35)
AST: 22 U/L (ref 0–37)
Alkaline Phosphatase: 74 U/L (ref 39–117)
Anion gap: 9 (ref 5–15)
BUN: 14 mg/dL (ref 6–23)
CO2: 33 mEq/L — ABNORMAL HIGH (ref 19–32)
Calcium: 9.2 mg/dL (ref 8.4–10.5)
Chloride: 96 mEq/L (ref 96–112)
Creatinine, Ser: 0.63 mg/dL (ref 0.50–1.10)
GFR calc Af Amer: >90 mL/min (ref >90)
GFR calc non Af Amer: >90 mL/min (ref >90)
Glucose, Bld: 109 mg/dL — ABNORMAL HIGH (ref 70–99)
POTASSIUM: 4.3 meq/L (ref 3.7–5.3)
Sodium: 138 mEq/L (ref 137–147)
Total Bilirubin: 0.3 mg/dL (ref 0.3–1.2)
Total Protein: 7.7 g/dL (ref 6.0–8.3)

## 2014-08-13 LAB — DIRECT ANTIGLOBULIN TEST (NOT AT ARMC): DAT, IGG: NEGATIVE

## 2014-08-13 LAB — LACTATE DEHYDROGENASE: LDH: 191 U/L (ref 94–250)

## 2014-08-13 NOTE — Progress Notes (Signed)
LABS DRAWN FOR FLOW CYTO,CBCD,RET,CMP,LDH,B2M,DAT

## 2014-08-15 LAB — BETA 2 MICROGLOBULIN, SERUM: Beta-2 Microglobulin: 3.65 mg/L — ABNORMAL HIGH (ref <=2.51)

## 2014-08-22 ENCOUNTER — Other Ambulatory Visit: Payer: Self-pay

## 2014-08-22 ENCOUNTER — Other Ambulatory Visit (HOSPITAL_COMMUNITY): Payer: Self-pay

## 2014-08-22 DIAGNOSIS — D696 Thrombocytopenia, unspecified: Secondary | ICD-10-CM

## 2014-08-22 MED ORDER — LINACLOTIDE 145 MCG PO CAPS: 145.0000 ug | ORAL_CAPSULE | Freq: Every day | ORAL | Status: DC

## 2014-08-22 MED ORDER — DEXLANSOPRAZOLE 60 MG PO CPDR: 60.0000 mg | DELAYED_RELEASE_CAPSULE | Freq: Every day | ORAL | Status: DC

## 2014-08-26 ENCOUNTER — Telehealth: Payer: Self-pay | Admitting: Internal Medicine

## 2014-08-26 ENCOUNTER — Encounter (HOSPITAL_COMMUNITY): Payer: Medicare Other | Attending: Hematology and Oncology

## 2014-08-26 ENCOUNTER — Encounter (HOSPITAL_COMMUNITY): Payer: Self-pay

## 2014-08-26 ENCOUNTER — Encounter (HOSPITAL_BASED_OUTPATIENT_CLINIC_OR_DEPARTMENT_OTHER): Payer: Medicare Other

## 2014-08-26 VITALS — BP 149/76 | HR 74 | Temp 97.8°F | Resp 20 | Wt 178.7 lb

## 2014-08-26 DIAGNOSIS — I10 Essential (primary) hypertension: Secondary | ICD-10-CM

## 2014-08-26 DIAGNOSIS — D72820 Lymphocytosis (symptomatic): Secondary | ICD-10-CM | POA: Diagnosis not present

## 2014-08-26 DIAGNOSIS — Z9981 Dependence on supplemental oxygen: Secondary | ICD-10-CM | POA: Insufficient documentation

## 2014-08-26 DIAGNOSIS — R5383 Other fatigue: Secondary | ICD-10-CM | POA: Diagnosis not present

## 2014-08-26 DIAGNOSIS — R5381 Other malaise: Secondary | ICD-10-CM | POA: Insufficient documentation

## 2014-08-26 DIAGNOSIS — Z87891 Personal history of nicotine dependence: Secondary | ICD-10-CM | POA: Diagnosis not present

## 2014-08-26 DIAGNOSIS — K589 Irritable bowel syndrome without diarrhea: Secondary | ICD-10-CM | POA: Insufficient documentation

## 2014-08-26 DIAGNOSIS — D649 Anemia, unspecified: Secondary | ICD-10-CM | POA: Insufficient documentation

## 2014-08-26 DIAGNOSIS — K5730 Diverticulosis of large intestine without perforation or abscess without bleeding: Secondary | ICD-10-CM | POA: Diagnosis not present

## 2014-08-26 DIAGNOSIS — D696 Thrombocytopenia, unspecified: Secondary | ICD-10-CM | POA: Insufficient documentation

## 2014-08-26 DIAGNOSIS — Z90711 Acquired absence of uterus with remaining cervical stump: Secondary | ICD-10-CM | POA: Insufficient documentation

## 2014-08-26 DIAGNOSIS — H5440 Blindness, one eye, unspecified eye: Secondary | ICD-10-CM | POA: Insufficient documentation

## 2014-08-26 DIAGNOSIS — J4489 Other specified chronic obstructive pulmonary disease: Secondary | ICD-10-CM | POA: Diagnosis not present

## 2014-08-26 DIAGNOSIS — Z79899 Other long term (current) drug therapy: Secondary | ICD-10-CM | POA: Diagnosis not present

## 2014-08-26 DIAGNOSIS — J449 Chronic obstructive pulmonary disease, unspecified: Secondary | ICD-10-CM | POA: Insufficient documentation

## 2014-08-26 DIAGNOSIS — F411 Generalized anxiety disorder: Secondary | ICD-10-CM | POA: Insufficient documentation

## 2014-08-26 DIAGNOSIS — I509 Heart failure, unspecified: Secondary | ICD-10-CM | POA: Insufficient documentation

## 2014-08-26 LAB — CBC WITH DIFFERENTIAL/PLATELET
BASOS PCT: 0 % (ref 0–1)
Basophils Absolute: 0 10*3/uL (ref 0.0–0.1)
EOS PCT: 3 % (ref 0–5)
Eosinophils Absolute: 0.2 10*3/uL (ref 0.0–0.7)
HEMATOCRIT: 36.7 % (ref 36.0–46.0)
Hemoglobin: 11.8 g/dL — ABNORMAL LOW (ref 12.0–15.0)
Lymphocytes Relative: 41 % (ref 12–46)
Lymphs Abs: 2.9 10*3/uL (ref 0.7–4.0)
MCH: 29.6 pg (ref 26.0–34.0)
MCHC: 32.2 g/dL (ref 30.0–36.0)
MCV: 92 fL (ref 78.0–100.0)
Monocytes Absolute: 0.7 10*3/uL (ref 0.1–1.0)
Monocytes Relative: 10 % (ref 3–12)
NEUTROS ABS: 3.2 10*3/uL (ref 1.7–7.7)
Neutrophils Relative %: 46 % (ref 43–77)
Platelets: 187 10*3/uL (ref 150–400)
RBC: 3.99 MIL/uL (ref 3.87–5.11)
RDW: 13 % (ref 11.5–15.5)
WBC: 7 10*3/uL (ref 4.0–10.5)

## 2014-08-26 NOTE — Progress Notes (Signed)
LABS DRAWN FOR CBCD. 

## 2014-08-26 NOTE — Progress Notes (Signed)
[facility]  OFFICE PROGRESS NOTE  Asencion Noble, MD [address] [place] [address]  DIAGNOSIS: Lymphocytosis  Thrombocytopenia  Chief Complaint  Patient presents with  . Lymphocytosis, anemia, thrombocytopenia    CURRENT THERAPY: Undergoing workup for relative lymphocytosis, anemia, and thrombocytopenia.  INTERVAL HISTORY: [name] 63 y.o. female returns for discussion of additional workup to explain relative lymphocytosis, anemia, and thrombocytopenia.  She is still feeling less energetic than usual. She denies any fever, night sweats, abdominal pain, but has had loose stools since being started on Linzess. She denies any melena, hematochezia, hematuria, easy satiety, and does wear oxygen at all times. She denies a sore throat, expectoration, nasal drip, earache, lower extremity swelling or redness, skin rash, or seizures.  MEDICAL HISTORY: Past Medical History  Diagnosis Date  . COPD (chronic obstructive pulmonary disease)   . CHF (congestive heart failure)   . Pneumonia   . Anxiety   . Hypertension   . Nicotine addiction 04/07/2014    INTERIM HISTORY: has Nicotine addiction and Unspecified constipation on her problem list.    ALLERGIES:  has No Known Allergies.  MEDICATIONS: has a current medication list which includes the following prescription(s): albuterol, albuterol, alprazolam, dexlansoprazole, fluticasone-salmeterol, hydrocodone-acetaminophen, linaclotide, losartan-hydrochlorothiazide, multivitamin with minerals, tiotropium, wheat dextrin, and polyethylene glycol-electrolytes.  SURGICAL HISTORY:  Past Surgical History  Procedure Laterality Date  . Abdominal hysterectomy    . Cesarean section    . Colonoscopy  10/19/2004    Dr. Gala Romney- internal hemorrhoids, o/w normal rectum, normal colon  . Colonoscopy N/A 07/30/2014    Procedure: COLONOSCOPY;  Surgeon: Daneil Dolin, MD;  Location: [facility];  Service: Endoscopy;  Laterality: N/A;  1:15  . Esophagogastroduodenoscopy N/A 07/30/2014    Procedure: ESOPHAGOGASTRODUODENOSCOPY (EGD);  Surgeon: Daneil Dolin, MD;  Location: [facility];  Service: Endoscopy;  Laterality: N/A;    FAMILY HISTORY: family history includes Cancer in her daughter, sister, and sister; Migraines in her daughter. There is no history of Colon cancer. She was adopted.  SOCIAL HISTORY:  reports that she quit smoking about 16 months ago. She has never used smokeless tobacco. She reports that she does not drink alcohol or use illicit drugs.  REVIEW OF SYSTEMS:  Other than that discussed above is noncontributory.  PHYSICAL EXAMINATION: ECOG PERFORMANCE STATUS: 1 - Symptomatic but completely ambulatory  Blood pressure 149/76, pulse 74, temperature 97.8 F (36.6 C), temperature source Oral, resp. rate 20, weight 178 lb 11.2 oz (81.058 kg), SpO2 95.00%.  GENERAL:alert, no distress and comfortable. Oxygen in place. SKIN: skin color, texture, turgor are normal, no rashes or significant lesions EYES: Right eye blindness. Left pupil reactive to light. No conjunctival injection.  SINUSES: No redness or tenderness over maxillary or ethmoid sinuses OROPHARYNX:no exudate, no erythema on lips, buccal mucosa, or tongue. NECK: supple, thyroid normal size, non-tender, without nodularity. No masses CHEST: Increased AP diameter with no breast masses. LYMPH:  no palpable lymphadenopathy in the cervical, axillary or inguinal LUNGS: clear to auscultation and percussion with normal breathing effort. No rhonchi. HEART: regular rate & rhythm and no murmurs. ABDOMEN:abdomen soft, non-tender and normal bowel sounds MUSCULOSKELETAL:no cyanosis of digits and no clubbing. Range of motion normal.  NEURO: alert & oriented x 3 with fluent speech, no focal motor/sensory deficits   LABORATORY DATA: Lab on 08/26/2014  Component Date Value Ref Range Status  . WBC 08/26/2014 7.0  4.0 -  10.5 K/uL Final  . RBC 08/26/2014 3.99  3.87 - 5.11 MIL/uL Final  . Hemoglobin 08/26/2014 11.8* 12.0 - 15.0 g/dL Final  . HCT 08/26/2014 36.7  36.0 - 46.0 % Final  . MCV 08/26/2014 92.0  78.0 - 100.0 fL Final  . MCH 08/26/2014 29.6  26.0 - 34.0 pg Final  . MCHC 08/26/2014 32.2  30.0 - 36.0 g/dL Final  . RDW 08/26/2014 13.0  11.5 - 15.5 % Final  . Platelets 08/26/2014 187  150 - 400 K/uL Final  . Neutrophils Relative % 08/26/2014 46  43 - 77 % Final  . Lymphocytes Relative 08/26/2014 41  12 - 46 % Final  . Monocytes Relative 08/26/2014 10  3 - 12 % Final  . Eosinophils Relative 08/26/2014 3  0 - 5 % Final  . Basophils Relative 08/26/2014 0  0 - 1 % Final  . Neutro Abs 08/26/2014 3.2  1.7 - 7.7 K/uL Final  . Lymphs Abs 08/26/2014 2.9  0.7 - 4.0 K/uL Final  . Monocytes Absolute 08/26/2014 0.7  0.1 - 1.0 K/uL Final  . Eosinophils Absolute 08/26/2014 0.2  0.0 - 0.7 K/uL Final  . Basophils Absolute 08/26/2014 0.0  0.0 - 0.1 K/uL Final  . WBC Morphology 08/26/2014 ATYPICAL LYMPHOCYTES   Final  Lab on 08/13/2014  Component Date Value Ref Range Status  . WBC 08/13/2014 6.9  4.0 - 10.5 K/uL Final  . RBC 08/13/2014 4.01  3.87 - 5.11 MIL/uL Final  . Hemoglobin 08/13/2014 11.8* 12.0 - 15.0 g/dL Final  . HCT 08/13/2014 37.1  36.0 - 46.0 % Final  . MCV 08/13/2014 92.5  78.0 - 100.0 fL Final  . MCH 08/13/2014 29.4  26.0 - 34.0 pg Final  . MCHC 08/13/2014 31.8  30.0 - 36.0 g/dL Final  . RDW 08/13/2014 13.1  11.5 - 15.5 % Final  . Platelets 08/13/2014 182  150 - 400 K/uL Final  . Neutrophils Relative % 08/13/2014 44  43 - 77 % Final  . Neutro Abs 08/13/2014 3.1  1.7 - 7.7 K/uL Final  . Lymphocytes Relative 08/13/2014 38  12 - 46 % Final  . Lymphs Abs 08/13/2014 2.6  0.7 - 4.0 K/uL Final  . Monocytes Relative 08/13/2014 11  3 - 12 % Final  . Monocytes Absolute 08/13/2014 0.8  0.1 - 1.0 K/uL Final  . Eosinophils Relative 08/13/2014 6* 0 - 5 % Final  . Eosinophils Absolute 08/13/2014 0.4  0.0 - 0.7  K/uL Final  . Basophils Relative 08/13/2014 1  0 - 1 % Final  . Basophils Absolute 08/13/2014 0.0  0.0 - 0.1 K/uL Final  . Retic Ct Pct 08/13/2014 1.2  0.4 - 3.1 % Final  . RBC. 08/13/2014 4.01  3.87 - 5.11 MIL/uL Final  . Retic Count, Manual 08/13/2014 48.1  19.0 - 186.0 K/uL Final  . Sodium 08/13/2014 138  137 - 147 mEq/L Final  . Potassium 08/13/2014 4.3  3.7 - 5.3 mEq/L Final  . Chloride 08/13/2014 96  96 - 112 mEq/L Final  . CO2 08/13/2014 33* 19 - 32 mEq/L Final  . Glucose, Bld 08/13/2014 109* 70 - 99 mg/dL Final  . BUN 08/13/2014 14  6 - 23 mg/dL Final  . Creatinine, Ser 08/13/2014 0.63  0.50 - 1.10 mg/dL Final  . Calcium 08/13/2014 9.2  8.4 - 10.5 mg/dL Final  . Total Protein 08/13/2014 7.7  6.0 - 8.3 g/dL Final  . Albumin 08/13/2014 4.0  3.5 - 5.2 g/dL Final  .  AST 08/13/2014 22  0 - 37 U/L Final  . ALT 08/13/2014 20  0 - 35 U/L Final  . Alkaline Phosphatase 08/13/2014 74  39 - 117 U/L Final  . Total Bilirubin 08/13/2014 0.3  0.3 - 1.2 mg/dL Final  . GFR calc non Af Amer 08/13/2014 >90  >90 mL/min Final  . GFR calc Af Amer 08/13/2014 >90  >90 mL/min Final   Comment: (NOTE)                          The eGFR has been calculated using the CKD EPI equation.                          This calculation has not been validated in all clinical situations.                          eGFR's persistently <90 mL/min signify possible Chronic Kidney                          Disease.  . Anion gap 08/13/2014 9  5 - 15 Final  . LDH 08/13/2014 191  94 - 250 U/L Final  . Beta-2 Microglobulin 08/13/2014 3.65* <=2.51 mg/L Final   Performed at [facility]  . DAT, complement 08/13/2014 NOT NEEDED   Final  . DAT, IgG 08/13/2014 NEG   Final    PATHOLOGY:  for Mccaughey, Cartier E ([id number]) Patient: Janowski, Kharter E Collected: 08/12/2014 Client: [facility] Accession: [id number] Received: 08/13/2014  , MD DOB: 01/05/1951 Age: 62 Gender: F Reported: 08/14/2014 [address] Patient Ph: [phone] MRN #: [id number] [place], [place] [address] Visit #: [id number] Chart #: Phone: Fax: CC: FLOW CYTOMETRY REPORT INTERPRETATION Interpretation Peripheral Blood Flow Cytometry - NO MONOCLONAL B-CELL POPULATION OR ABNORMAL T-CELL PHENOTYPE IDENTIFIED. BASSAM SMIR MD Pathologist, Electronic Signature (Case signed 08/14/2014) GROSS AND MICROSCOPIC INFORMATION Source Peripheral Blood Flow Cytometry Microscopic Gated population: Flow cytometric immunophenotyping is performed using antibiodies to the antigens listed in the table below. Electronic gates are placed around a cell cluster displaying light scatter properties corresponding to lymphocytes. - Abnormal Cells in gated population: NA - Phenotype of Abnormal Cells: NA    Urinalysis    Component Value Date/Time   COLORURINE YELLOW 08/17/2011 1302   APPEARANCEUR CLEAR 08/17/2011 1302   LABSPEC 1.015 08/17/2011 1302   PHURINE 6.0 08/17/2011 1302   GLUCOSEU NEGATIVE 08/17/2011 1302   HGBUR TRACE* 08/17/2011 1302   BILIRUBINUR NEGATIVE 08/17/2011 1302   KETONESUR NEGATIVE 08/17/2011 1302   PROTEINUR NEGATIVE 08/17/2011 1302   UROBILINOGEN 0.2 08/17/2011 1302   NITRITE NEGATIVE 08/17/2011 1302   LEUKOCYTESUR NEGATIVE 08/17/2011 1302    RADIOGRAPHIC STUDIES: No results found.  ASSESSMENT:  #1. No evidence of hematologic disorder with normalization of lymphocytosis,, cytopenia, with minimal residual mild anemia. #2. Allergic diathesis, probably manifested as previously noted abnormal CBC. #3.Chronic obstructive pulmonary disease. #4. Diverticulosis, quiescent. #5. Irritable bowel syndrome, constipating type, now with loose stools on Linzess. #6. Hypertension, on treatment.   PLAN:  #1. No additional followup is scheduled in the hematology clinic. #2. Suggest repeating CBC in August of 2015 to assess whether or not an immune phenomenon occurs with sensitivity to weed pollen to explain her changes in blood  count that appear to be transient.   All questions were answered. The patient knows   to call the clinic with any problems, questions or concerns. We can certainly see the patient much sooner if necessary.   I spent 25 minutes counseling the patient face to face. The total time spent in the appointment was 30 minutes.    ,  A, MD 08/26/2014 2:00 PM  DISCLAIMER:  This note was dictated with voice recognition software.  Similar sounding words can inadvertently be transcribed inaccurately and may not be corrected upon review.   

## 2014-08-26 NOTE — Telephone Encounter (Signed)
Pt has called with multiple questions. She wants to know about Linzess and another medicine she doesn't remember the name of and what the long term effects are and is she to be on this the rest of her life. Then she was talking about having chronic anemia and she was going to see a doctor today. She wanted to speak with LSL or make an OV with LSL ASAP. She said that she was told she was to follow up in 2 weeks and she has been waiting a month. I told her that LSL next available will be Oct 1st. Please advise when this patient needs a FU OV and please return her call the address the other. [phone]

## 2014-08-26 NOTE — Patient Instructions (Signed)
[facility] Discharge Instructions  RECOMMENDATIONS MADE BY THE CONSULTANT AND ANY TEST RESULTS WILL BE SENT TO YOUR REFERRING PHYSICIAN.  No further appointments needed. Please call for any changes or questions.  Thank you for choosing [facility] to provide your oncology and hematology care.  To afford each patient quality time with our providers, please arrive at least 15 minutes before your scheduled appointment time.  With your help, our goal is to use those 15 minutes to complete the necessary work-up to ensure our physicians have the information they need to help with your evaluation and healthcare recommendations.    Effective January 1st, 2014, we ask that you re-schedule your appointment with our physicians should you arrive 10 or more minutes late for your appointment.  We strive to give you quality time with our providers, and arriving late affects you and other patients whose appointments are after yours.    Again, thank you for choosing [facility].  Our hope is that these requests will decrease the amount of time that you wait before being seen by our physicians.       _____________________________________________________________  Should you have questions after your visit to [facility], please contact our office at [phone] between the hours of 8:30 a.m. and 4:30 p.m.  Voicemails left after 4:30 p.m. will not be returned until the following business day.  For prescription refill requests, have your pharmacy contact our office with your prescription refill request.    _______________________________________________________________  We hope that we have given you very good care.  You may receive a patient satisfaction survey in the mail, please complete it and return it as soon as possible.  We value your feedback!  _______________________________________________________________  Have you asked about our STAR  program?  STAR stands for Survivorship Training and Rehabilitation, and this is a nationally recognized cancer care program that focuses on survivorship and rehabilitation.  Cancer and cancer treatments may cause problems, such as, pain, making you feel tired and keeping you from doing the things that you need or want to do. Cancer rehabilitation can help. Our goal is to reduce these troubling effects and help you have the best quality of life possible.  You may receive a survey from a nurse that asks questions about your current state of health.  Based on the survey results, all eligible patients will be referred to the [facility] program for an evaluation so we can better serve you!  A frequently asked questions sheet is available upon request.

## 2014-08-27 NOTE — Telephone Encounter (Signed)
Per JL use URG spot because patient insists on being seen ASAP for her anemia. Pt is aware of OV on 9/10 at 1130 with LSL

## 2014-08-27 NOTE — Telephone Encounter (Signed)
Tried to call pt- LMOM 

## 2014-08-27 NOTE — Telephone Encounter (Signed)
I spoke with the pt. We discussed Linzess and dexilant. She said she needed an rx for the linzess but an rx was sent to [place] on 08/22/14. She went to the hematologist and said he gave her a good report and would like for LSL to review it prior to her ov. She insists that RMR told her she needed to seen in the office within 2 weeks after her procedure. I cannot find any documentation of this in the pts chart. I asked Manuela Schwartz to get pt an office visit asap because she feels like she really needs to be seen  for a follow up and her daughter is telling her that she needs to be seen soon. Manuela Schwartz has already made ov for the pt. Routing to LSL for FYI.

## 2014-08-28 NOTE — Telephone Encounter (Signed)
Evaluated oncology/hematology notes. OV we me noted.

## 2014-09-04 ENCOUNTER — Encounter: Payer: Self-pay | Admitting: Gastroenterology

## 2014-09-04 ENCOUNTER — Ambulatory Visit (INDEPENDENT_AMBULATORY_CARE_PROVIDER_SITE_OTHER): Payer: Medicare Other | Admitting: Gastroenterology

## 2014-09-04 ENCOUNTER — Encounter (INDEPENDENT_AMBULATORY_CARE_PROVIDER_SITE_OTHER): Payer: Self-pay

## 2014-09-04 VITALS — BP 136/77 | HR 80 | Temp 97.4°F | Ht 63.0 in | Wt 180.8 lb

## 2014-09-04 DIAGNOSIS — D649 Anemia, unspecified: Secondary | ICD-10-CM | POA: Diagnosis not present

## 2014-09-04 DIAGNOSIS — K219 Gastro-esophageal reflux disease without esophagitis: Secondary | ICD-10-CM | POA: Insufficient documentation

## 2014-09-04 DIAGNOSIS — D539 Nutritional anemia, unspecified: Secondary | ICD-10-CM | POA: Insufficient documentation

## 2014-09-04 DIAGNOSIS — K5900 Constipation, unspecified: Secondary | ICD-10-CM | POA: Diagnosis not present

## 2014-09-04 MED ORDER — LINACLOTIDE 145 MCG PO CAPS: 145.0000 ug | ORAL_CAPSULE | Freq: Every day | ORAL | Status: DC

## 2014-09-04 NOTE — Assessment & Plan Note (Signed)
Recent anemia, thrombocytopenia, lymphocytosis. Significant improvement in CBC and fortunately work up was negative for CLL. Discussed at length with patient. Significant anxiety level on her part. Will recheck CBC with diff in 11/2014. She may opt to have this done at time of her physical with Dr. Willey Blade.

## 2014-09-04 NOTE — Progress Notes (Signed)
Primary Care Physician: Asencion Noble, MD  Primary Gastroenterologist:  Garfield Cornea, MD   Chief Complaint  Patient presents with  . Follow-up    HPI: [name] is a 63 y.o. female here for f/u. Patient requested urgent OV for f/u recent EGD/TCS. She had unremarkable EGD. Scattered sigmoid diverticula, 27mm polyp (tubular adenoma) removed. She is doing well. Two teaspoons of benefiber twice a day. Trying to eat more healthy. Increased fiber. Lean meats. Linzess daily. BM two every morning. First one solid, next one watery. No melena, brbpr. Abdomen feels so much better. Reflux doing good. No vomiting or heartburn. Energy level has improved.   Received good report from Dr. Barnet Glasgow, her oncologist. We referred her for significant lymphocytosis, anemia, mild thrombocytopenia. Initially she was told she likely had CLL. Her tests came back normal and her lymphocytosis has resolved. Her Hgb rebounded as well. Suspected allergic/allergen related reaction.     Current Outpatient Prescriptions  Medication Sig Dispense Refill  . albuterol (PROVENTIL HFA;VENTOLIN HFA) 108 (90 BASE) MCG/ACT inhaler Inhale 2 puffs into the lungs every 6 (six) hours as needed. For shortness of breath        . albuterol (PROVENTIL) (2.5 MG/3ML) 0.083% nebulizer solution Take 2.5 mg by nebulization every 6 (six) hours as needed. For shortness of breath       . ALPRAZolam (XANAX) 1 MG tablet Take 1 mg by mouth 4 (four) times daily as needed. For anxiety.      Marland Kitchen dexlansoprazole (DEXILANT) 60 MG capsule Take 1 capsule (60 mg total) by mouth daily.  30 capsule  5  . Fluticasone-Salmeterol (ADVAIR) 250-50 MCG/DOSE AEPB Inhale 1 puff into the lungs 2 (two) times daily.  60 each  12  . HYDROcodone-acetaminophen (NORCO) 10-325 MG per tablet 1 tablet 2 (two) times daily as needed.       . Linaclotide (LINZESS) 145 MCG CAPS capsule Take 1 capsule (145 mcg total) by mouth daily. On empty stomach  30 capsule  11  .  losartan-hydrochlorothiazide (HYZAAR) 100-25 MG per tablet Take 1 tablet by mouth daily.      . Multiple Vitamin (MULTIVITAMIN WITH MINERALS) TABS Take 1 tablet by mouth daily.      Marland Kitchen tiotropium (SPIRIVA) 18 MCG inhalation capsule Place 18 mcg into inhaler and inhale daily.        . Wheat Dextrin (BENEFIBER PO) Take by mouth.       No current facility-administered medications for this visit.    Allergies as of 09/04/2014  . (No Known Allergies)    ROS:  General: Negative for anorexia, weight loss, fever, chills, fatigue, weakness. ENT: Negative for hoarseness, difficulty swallowing , nasal congestion. CV: Negative for chest pain, angina, palpitations, dyspnea on exertion, peripheral edema.  Respiratory: Negative for dyspnea at rest, dyspnea on exertion, cough, sputum, wheezing.  GI: See history of present illness. GU:  Negative for dysuria, hematuria, urinary incontinence, urinary frequency, nocturnal urination.  Endo: Negative for unusual weight change.    Physical Examination:   BP 136/77  Pulse 80  Temp(Src) 97.4 F (36.3 C) (Oral)  Ht 5\' 3"  (1.6 m)  Wt 180 lb 12.8 oz (82.01 kg)  BMI 32.04 kg/m2  General: Well-nourished, well-developed in no acute distress.  Eyes: No icterus. Mouth: Oropharyngeal mucosa moist and pink , no lesions erythema or exudate. Lungs: Clear to auscultation bilaterally.  Heart: Regular rate and rhythm, no murmurs rubs or gallops.  Abdomen: Bowel sounds are normal, nontender, nondistended, no  hepatosplenomegaly or masses, no abdominal bruits or hernia , no rebound or guarding.   Extremities: No lower extremity edema. No clubbing or deformities. Neuro: Alert and oriented x 4   Skin: Warm and dry, no jaundice.   Psych: Alert and cooperative, normal mood and affect.  Labs:  Lab Results  Component Value Date   WBC 7.0 08/26/2014   HGB 11.8* 08/26/2014   HCT 36.7 08/26/2014   MCV 92.0 08/26/2014   PLT 187 08/26/2014   Lab Results  Component Value Date    CREATININE 0.63 08/13/2014   BUN 14 08/13/2014   NA 138 08/13/2014   K 4.3 08/13/2014   CL 96 08/13/2014   CO2 33* 08/13/2014   Lab Results  Component Value Date   ALT 20 08/13/2014   AST 22 08/13/2014   ALKPHOS 74 08/13/2014   BILITOT 0.3 08/13/2014    Imaging Studies: No results found.

## 2014-09-04 NOTE — Assessment & Plan Note (Signed)
Continue Dexilant as before.

## 2014-09-04 NOTE — Assessment & Plan Note (Signed)
Doing well on Linzess and Benefiber. Continue regimen. Can continue indefinitely if needed.

## 2014-09-04 NOTE — Patient Instructions (Signed)
1. CBC in 3 months. 2. Continue Dexilant and Linzess. (RX for Linzess sent to pharmacy).

## 2014-09-04 NOTE — Progress Notes (Signed)
cc'ed to pcp °

## 2014-09-04 NOTE — Progress Notes (Signed)
Let's plan for CBC with diff in 11/2014. Please send to her the first of the month, she will likely have drawn when she goes for her physical with PCP.

## 2014-09-25 ENCOUNTER — Other Ambulatory Visit (HOSPITAL_COMMUNITY): Payer: Self-pay | Admitting: Internal Medicine

## 2014-09-25 DIAGNOSIS — Z139 Encounter for screening, unspecified: Secondary | ICD-10-CM

## 2014-09-25 DIAGNOSIS — Z1231 Encounter for screening mammogram for malignant neoplasm of breast: Secondary | ICD-10-CM

## 2014-10-03 ENCOUNTER — Ambulatory Visit (HOSPITAL_COMMUNITY)
Admission: RE | Admit: 2014-10-03 | Discharge: 2014-10-03 | Disposition: A | Discharge status: Home / Self Care | Payer: Medicare Other | Source: Ambulatory Visit | Attending: Internal Medicine | Admitting: Internal Medicine

## 2014-10-03 DIAGNOSIS — Z1231 Encounter for screening mammogram for malignant neoplasm of breast: Secondary | ICD-10-CM

## 2014-10-06 DIAGNOSIS — Z23 Encounter for immunization: Secondary | ICD-10-CM | POA: Diagnosis not present

## 2014-10-08 ENCOUNTER — Other Ambulatory Visit: Payer: Self-pay | Admitting: Gastroenterology

## 2014-10-08 DIAGNOSIS — D649 Anemia, unspecified: Secondary | ICD-10-CM

## 2014-10-08 NOTE — Progress Notes (Signed)
Lab order on file. 

## 2014-10-27 ENCOUNTER — Encounter: Payer: Self-pay | Admitting: Gastroenterology

## 2014-11-11 ENCOUNTER — Encounter: Payer: Self-pay | Admitting: *Deleted

## 2014-11-11 ENCOUNTER — Other Ambulatory Visit: Payer: Self-pay | Admitting: *Deleted

## 2014-11-11 DIAGNOSIS — D649 Anemia, unspecified: Secondary | ICD-10-CM

## 2014-11-24 ENCOUNTER — Ambulatory Visit (HOSPITAL_COMMUNITY)
Admission: RE | Admit: 2014-11-24 | Discharge: 2014-11-24 | Disposition: A | Discharge status: Home / Self Care | Payer: Medicare Other | Source: Ambulatory Visit | Attending: Internal Medicine | Admitting: Internal Medicine

## 2014-11-24 ENCOUNTER — Other Ambulatory Visit (HOSPITAL_COMMUNITY): Payer: Self-pay | Admitting: Internal Medicine

## 2014-11-24 DIAGNOSIS — M25511 Pain in right shoulder: Secondary | ICD-10-CM

## 2014-11-24 DIAGNOSIS — J449 Chronic obstructive pulmonary disease, unspecified: Secondary | ICD-10-CM | POA: Diagnosis not present

## 2014-11-24 DIAGNOSIS — M545 Low back pain: Secondary | ICD-10-CM | POA: Diagnosis not present

## 2014-11-24 DIAGNOSIS — Z87891 Personal history of nicotine dependence: Secondary | ICD-10-CM | POA: Diagnosis not present

## 2014-11-24 DIAGNOSIS — J440 Chronic obstructive pulmonary disease with acute lower respiratory infection: Secondary | ICD-10-CM | POA: Diagnosis not present

## 2014-11-24 DIAGNOSIS — J45909 Unspecified asthma, uncomplicated: Secondary | ICD-10-CM | POA: Insufficient documentation

## 2014-11-24 DIAGNOSIS — I10 Essential (primary) hypertension: Secondary | ICD-10-CM | POA: Diagnosis not present

## 2014-11-25 ENCOUNTER — Telehealth: Payer: Self-pay | Admitting: Internal Medicine

## 2014-11-25 NOTE — Telephone Encounter (Signed)
WENT TO DR Willey Blade AND HE TOLD HER THATS HE DID NOT HAVE TO HAVE BLOODWORK DRAWN THAT WAS ORDERED FROM HERE SINCE SHE HAS LABS DRAWN FOR HIM AT THE END OF THE MONTH   PLEASE CALL PATIENT AND LET HER KNOW WHAT TO DO   [phone]   SHE STATED DR. FAGAN WOULD FAX [place] WHAT WE MAY NEED

## 2014-11-26 NOTE — Telephone Encounter (Signed)
That is fine 

## 2014-11-26 NOTE — Telephone Encounter (Signed)
I spoke with the pt. She said she is due to have blood work with Dr.Fagan on 12/25/14 for her yearly physical. She is going to do her cbc for [place] when she does the blood work for Dr.Fagan. She didn't want LSL to think she wasn't going to do her blood work.

## 2014-12-25 DIAGNOSIS — I10 Essential (primary) hypertension: Secondary | ICD-10-CM | POA: Diagnosis not present

## 2014-12-25 DIAGNOSIS — Z79899 Other long term (current) drug therapy: Secondary | ICD-10-CM | POA: Diagnosis not present

## 2014-12-25 DIAGNOSIS — E785 Hyperlipidemia, unspecified: Secondary | ICD-10-CM | POA: Diagnosis not present

## 2015-01-02 DIAGNOSIS — Z0001 Encounter for general adult medical examination with abnormal findings: Secondary | ICD-10-CM | POA: Diagnosis not present

## 2015-01-02 DIAGNOSIS — Z23 Encounter for immunization: Secondary | ICD-10-CM | POA: Diagnosis not present

## 2015-02-25 ENCOUNTER — Encounter: Payer: Self-pay | Admitting: Internal Medicine

## 2015-03-12 DIAGNOSIS — H2513 Age-related nuclear cataract, bilateral: Secondary | ICD-10-CM | POA: Diagnosis not present

## 2015-03-18 DIAGNOSIS — H2512 Age-related nuclear cataract, left eye: Secondary | ICD-10-CM | POA: Diagnosis not present

## 2015-03-25 DIAGNOSIS — H2512 Age-related nuclear cataract, left eye: Secondary | ICD-10-CM | POA: Diagnosis not present

## 2015-03-27 HISTORY — PX: CATARACT EXTRACTION: SUR2

## 2015-05-08 DIAGNOSIS — J440 Chronic obstructive pulmonary disease with acute lower respiratory infection: Secondary | ICD-10-CM | POA: Diagnosis not present

## 2015-05-08 DIAGNOSIS — M4306 Spondylolysis, lumbar region: Secondary | ICD-10-CM | POA: Diagnosis not present

## 2015-05-08 DIAGNOSIS — I10 Essential (primary) hypertension: Secondary | ICD-10-CM | POA: Diagnosis not present

## 2015-08-20 DIAGNOSIS — Z961 Presence of intraocular lens: Secondary | ICD-10-CM | POA: Diagnosis not present

## 2015-09-11 DIAGNOSIS — I10 Essential (primary) hypertension: Secondary | ICD-10-CM | POA: Diagnosis not present

## 2015-09-11 DIAGNOSIS — Z23 Encounter for immunization: Secondary | ICD-10-CM | POA: Diagnosis not present

## 2015-09-11 DIAGNOSIS — Z6834 Body mass index (BMI) 34.0-34.9, adult: Secondary | ICD-10-CM | POA: Diagnosis not present

## 2015-09-11 DIAGNOSIS — M545 Low back pain: Secondary | ICD-10-CM | POA: Diagnosis not present

## 2015-09-11 DIAGNOSIS — J440 Chronic obstructive pulmonary disease with acute lower respiratory infection: Secondary | ICD-10-CM | POA: Diagnosis not present

## 2015-09-29 ENCOUNTER — Other Ambulatory Visit (HOSPITAL_COMMUNITY): Payer: Self-pay | Admitting: Internal Medicine

## 2015-09-29 DIAGNOSIS — M25562 Pain in left knee: Secondary | ICD-10-CM

## 2015-09-29 DIAGNOSIS — M7122 Synovial cyst of popliteal space [Baker], left knee: Secondary | ICD-10-CM | POA: Diagnosis not present

## 2015-10-01 ENCOUNTER — Ambulatory Visit (HOSPITAL_COMMUNITY)
Admission: RE | Admit: 2015-10-01 | Discharge: 2015-10-01 | Disposition: A | Discharge status: Home / Self Care | Payer: Medicare Other | Source: Ambulatory Visit | Attending: Internal Medicine | Admitting: Internal Medicine

## 2015-10-01 DIAGNOSIS — M25562 Pain in left knee: Secondary | ICD-10-CM | POA: Insufficient documentation

## 2015-10-08 ENCOUNTER — Telehealth: Payer: Self-pay | Admitting: Adult Health

## 2015-10-08 NOTE — Telephone Encounter (Signed)
Taking antibiotic for pneumonia, had frank blood in urine yesterday, none today, to come in am for appt to be checked

## 2015-10-09 ENCOUNTER — Encounter: Payer: Self-pay | Admitting: Adult Health

## 2015-10-09 ENCOUNTER — Ambulatory Visit (INDEPENDENT_AMBULATORY_CARE_PROVIDER_SITE_OTHER): Payer: Medicare Other | Admitting: Adult Health

## 2015-10-09 VITALS — BP 130/70 | HR 84 | Temp 98.2°F | Ht 63.0 in | Wt 188.5 lb

## 2015-10-09 DIAGNOSIS — N952 Postmenopausal atrophic vaginitis: Secondary | ICD-10-CM

## 2015-10-09 DIAGNOSIS — N939 Abnormal uterine and vaginal bleeding, unspecified: Secondary | ICD-10-CM | POA: Diagnosis not present

## 2015-10-09 DIAGNOSIS — R319 Hematuria, unspecified: Secondary | ICD-10-CM | POA: Insufficient documentation

## 2015-10-09 HISTORY — DX: Postmenopausal atrophic vaginitis: N95.2

## 2015-10-09 HISTORY — DX: Hematuria, unspecified: R31.9

## 2015-10-09 HISTORY — DX: Abnormal uterine and vaginal bleeding, unspecified: N93.9

## 2015-10-09 LAB — POCT URINALYSIS DIPSTICK
Glucose, UA: NEGATIVE
Nitrite, UA: NEGATIVE
PROTEIN UA: NEGATIVE

## 2015-10-09 MED ORDER — NITROFURANTOIN MONOHYD MACRO 100 MG PO CAPS: 100.0000 mg | ORAL_CAPSULE | Freq: Two times a day (BID) | ORAL | Status: DC

## 2015-10-09 NOTE — Patient Instructions (Signed)
Push fluids Follow up prn Try luvena

## 2015-10-09 NOTE — Progress Notes (Signed)
Subjective:     Patient ID: [name], female   DOB: 02/11/1951, 64 y.o.   MRN: [id number]  HPI [name] is a 64 year old white female, who has recently had pneumonia and is on O2 for COPD, in complaining of blood in urine and she thinks some vaginal bleeding, she is sp hysterectomy. No pain, but no energy.  Review of Systems + blood in urine and ?vaginal bleeding, all other systems negative  Reviewed past medical,surgical, social and family history. Reviewed medications and allergies.     Objective:   Physical Exam BP 130/70 mmHg  Pulse 84  Temp(Src) 98.2 F (36.8 C)  Ht 5\' 3"  (1.6 m)  Wt 188 lb 8 oz (85.503 kg)  BMI 33.40 kg/m2 urine 2+ leuks, trace blood, Skin warm and dry.Pelvic: external genitalia is normal in appearance no lesions, vagina has strawberry appearance and irritated area on right, dry and thin and atrophic, cervix and uterus are abent, adnexa: no masses or tenderness noted. Bladder is non tender and no masses felt.Rectal: no hemorrhoids, hemoccult negative, done just to make sure no blood there.    Assessment:     Blood in urine Vaginal bleeding Atrophic vaginitis     Plan:    Push fluids UA C&S sent Rx macrobid 1 bid x 7 days Try luvena Follow up prn

## 2015-10-10 LAB — URINALYSIS, ROUTINE W REFLEX MICROSCOPIC
Bilirubin, UA: NEGATIVE
Glucose, UA: NEGATIVE
Ketones, UA: NEGATIVE
Nitrite, UA: NEGATIVE
PH UA: 7 (ref 5.0–7.5)
PROTEIN UA: NEGATIVE
RBC, UA: NEGATIVE
Specific Gravity, UA: 1.018 (ref 1.005–1.030)
Urobilinogen, Ur: 0.2 mg/dL (ref 0.2–1.0)

## 2015-10-10 LAB — MICROSCOPIC EXAMINATION: CASTS: NONE SEEN /LPF

## 2015-10-11 LAB — URINE CULTURE: ORGANISM ID, BACTERIA: NO GROWTH

## 2015-10-12 ENCOUNTER — Telehealth: Payer: Self-pay | Admitting: Adult Health

## 2015-10-12 NOTE — Telephone Encounter (Signed)
Left message I would call back

## 2015-10-13 ENCOUNTER — Telehealth: Payer: Self-pay | Admitting: *Deleted

## 2015-10-13 NOTE — Telephone Encounter (Signed)
Left message I called 

## 2015-10-15 ENCOUNTER — Telehealth: Payer: Self-pay | Admitting: Adult Health

## 2015-10-15 NOTE — Telephone Encounter (Signed)
Pt aware of urine and culture is negative. Follow up as needed.

## 2015-12-30 DIAGNOSIS — I10 Essential (primary) hypertension: Secondary | ICD-10-CM | POA: Diagnosis not present

## 2015-12-30 DIAGNOSIS — Z79899 Other long term (current) drug therapy: Secondary | ICD-10-CM | POA: Diagnosis not present

## 2015-12-30 DIAGNOSIS — J449 Chronic obstructive pulmonary disease, unspecified: Secondary | ICD-10-CM | POA: Diagnosis not present

## 2015-12-30 DIAGNOSIS — E785 Hyperlipidemia, unspecified: Secondary | ICD-10-CM | POA: Diagnosis not present

## 2016-01-12 ENCOUNTER — Other Ambulatory Visit (HOSPITAL_COMMUNITY): Payer: Self-pay | Admitting: Internal Medicine

## 2016-01-12 DIAGNOSIS — Z1231 Encounter for screening mammogram for malignant neoplasm of breast: Secondary | ICD-10-CM

## 2016-01-20 DIAGNOSIS — Z961 Presence of intraocular lens: Secondary | ICD-10-CM | POA: Diagnosis not present

## 2016-01-20 DIAGNOSIS — H26492 Other secondary cataract, left eye: Secondary | ICD-10-CM | POA: Diagnosis not present

## 2016-01-25 ENCOUNTER — Encounter: Payer: Self-pay | Admitting: Adult Health

## 2016-01-25 ENCOUNTER — Ambulatory Visit (HOSPITAL_COMMUNITY)
Admission: RE | Admit: 2016-01-25 | Discharge: 2016-01-25 | Disposition: A | Discharge status: Home / Self Care | Payer: Medicare Other | Source: Ambulatory Visit | Attending: Internal Medicine | Admitting: Internal Medicine

## 2016-01-25 ENCOUNTER — Ambulatory Visit (INDEPENDENT_AMBULATORY_CARE_PROVIDER_SITE_OTHER): Payer: Medicare Other | Admitting: Adult Health

## 2016-01-25 VITALS — BP 140/70 | HR 86 | Ht 62.25 in | Wt 179.0 lb

## 2016-01-25 DIAGNOSIS — R319 Hematuria, unspecified: Secondary | ICD-10-CM

## 2016-01-25 DIAGNOSIS — Z01419 Encounter for gynecological examination (general) (routine) without abnormal findings: Secondary | ICD-10-CM | POA: Diagnosis not present

## 2016-01-25 DIAGNOSIS — R3915 Urgency of urination: Secondary | ICD-10-CM | POA: Diagnosis not present

## 2016-01-25 DIAGNOSIS — Z1231 Encounter for screening mammogram for malignant neoplasm of breast: Secondary | ICD-10-CM | POA: Insufficient documentation

## 2016-01-25 DIAGNOSIS — Z1212 Encounter for screening for malignant neoplasm of rectum: Secondary | ICD-10-CM | POA: Diagnosis not present

## 2016-01-25 DIAGNOSIS — Z0000 Encounter for general adult medical examination without abnormal findings: Secondary | ICD-10-CM

## 2016-01-25 DIAGNOSIS — N952 Postmenopausal atrophic vaginitis: Secondary | ICD-10-CM

## 2016-01-25 HISTORY — DX: Urgency of urination: R39.15

## 2016-01-25 LAB — HEMOCCULT GUIAC POC 1CARD (OFFICE): FECAL OCCULT BLD: NEGATIVE

## 2016-01-25 LAB — POCT URINALYSIS DIPSTICK
GLUCOSE UA: NEGATIVE
Leukocytes, UA: NEGATIVE
Nitrite, UA: NEGATIVE
Protein, UA: NEGATIVE

## 2016-01-25 NOTE — Patient Instructions (Addendum)
Physical in 1 year Mammogram yearly  Push water  Labs with PCP Colonoscopy per GI

## 2016-01-25 NOTE — Progress Notes (Signed)
Patient ID: [name], female   DOB: [birth year], 65 y.o.   MRN: [id number] History of Present Illness: [name] is a 65 year old white female, in for a well woman gyn exam, she is sp hysterectomy.She complains of urinary urgency then can't pee. She has COPD and has O2.She has lost about 10 lbs and is so proud. PCP is Dr Willey Blade.  Current Medications, Allergies, Past Medical History, Past Surgical History, Family History and Social History were reviewed in [employer] record.     Review of Systems: Patient denies any headaches, hearing loss, fatigue, blurred vision, chest pain, abdominal pain, problems with bowel movements, or intercourse(not having sex). No joint pain or mood swings. See HPI for positives.  Physical Exam:BP 140/70 mmHg  Pulse 86  Ht 5' 2.25" (1.581 m)  Wt 179 lb (81.194 kg)  BMI 32.48 kg/m2urine has trace of blood General:  Well developed, well nourished, no acute distress Skin:  Warm and dry Neck:  Midline trachea, normal thyroid, good ROM, no lymphadenopathy, no carotid bruits heard Lungs; Clear to auscultation bilaterally Breast:  No dominant palpable mass, retraction, or nipple discharge Cardiovascular: Regular rate and rhythm Abdomen:  Soft, non tender, no hepatosplenomegaly Pelvic:  External genitalia is normal in appearance, no lesions.  The vagina is pale and dry with loss of rugae. Urethra has no lesions or masses. The cervix and uterus are absent.  No adnexal masses or tenderness noted.Bladder is non tender, no masses felt. Rectal: Good sphincter tone, no polyps, or hemorrhoids felt.  Hemoccult negative. Extremities/musculoskeletal:  No swelling or varicosities noted, no clubbing or cyanosis Psych:  No mood changes, alert and cooperative,seems happy   Impression: Well woman gyn exam no pap Atrophic vagina Blood in urine Urinary urgency    Plan: Use luvena UA C&S sent Increase water Physical in 1  Year Mammogram  yearly Labs with PCP Colonoscopy per GI

## 2016-01-26 LAB — URINALYSIS, ROUTINE W REFLEX MICROSCOPIC
Bilirubin, UA: NEGATIVE
GLUCOSE, UA: NEGATIVE
Ketones, UA: NEGATIVE
Nitrite, UA: NEGATIVE
PROTEIN UA: NEGATIVE
RBC, UA: NEGATIVE
Specific Gravity, UA: 1.018 (ref 1.005–1.030)
UUROB: 0.2 mg/dL (ref 0.2–1.0)
pH, UA: 6 (ref 5.0–7.5)

## 2016-01-26 LAB — MICROSCOPIC EXAMINATION
BACTERIA UA: NONE SEEN
CASTS: NONE SEEN /LPF

## 2016-01-26 LAB — URINE CULTURE: ORGANISM ID, BACTERIA: NO GROWTH

## 2016-01-27 ENCOUNTER — Telehealth: Payer: Self-pay | Admitting: Adult Health

## 2016-01-27 NOTE — Telephone Encounter (Signed)
Left message urine culture showed no growth and no blood in urine

## 2016-02-03 DIAGNOSIS — H2640 Unspecified secondary cataract: Secondary | ICD-10-CM | POA: Diagnosis not present

## 2016-02-03 DIAGNOSIS — H26492 Other secondary cataract, left eye: Secondary | ICD-10-CM | POA: Diagnosis not present

## 2016-02-19 DIAGNOSIS — Z6830 Body mass index (BMI) 30.0-30.9, adult: Secondary | ICD-10-CM | POA: Diagnosis not present

## 2016-02-19 DIAGNOSIS — R51 Headache: Secondary | ICD-10-CM | POA: Diagnosis not present

## 2016-04-07 DIAGNOSIS — J449 Chronic obstructive pulmonary disease, unspecified: Secondary | ICD-10-CM | POA: Diagnosis not present

## 2016-04-07 DIAGNOSIS — Z6831 Body mass index (BMI) 31.0-31.9, adult: Secondary | ICD-10-CM | POA: Diagnosis not present

## 2016-04-07 DIAGNOSIS — R739 Hyperglycemia, unspecified: Secondary | ICD-10-CM | POA: Diagnosis not present

## 2016-04-07 DIAGNOSIS — I10 Essential (primary) hypertension: Secondary | ICD-10-CM | POA: Diagnosis not present

## 2016-04-07 DIAGNOSIS — Z23 Encounter for immunization: Secondary | ICD-10-CM | POA: Diagnosis not present

## 2016-05-02 DIAGNOSIS — R5383 Other fatigue: Secondary | ICD-10-CM | POA: Diagnosis not present

## 2016-05-02 DIAGNOSIS — J449 Chronic obstructive pulmonary disease, unspecified: Secondary | ICD-10-CM | POA: Diagnosis not present

## 2016-05-02 DIAGNOSIS — Z0001 Encounter for general adult medical examination with abnormal findings: Secondary | ICD-10-CM | POA: Diagnosis not present

## 2016-05-02 DIAGNOSIS — I10 Essential (primary) hypertension: Secondary | ICD-10-CM | POA: Diagnosis not present

## 2016-05-30 DIAGNOSIS — I10 Essential (primary) hypertension: Secondary | ICD-10-CM | POA: Diagnosis not present

## 2016-05-30 DIAGNOSIS — J449 Chronic obstructive pulmonary disease, unspecified: Secondary | ICD-10-CM | POA: Diagnosis not present

## 2016-05-30 DIAGNOSIS — Z79899 Other long term (current) drug therapy: Secondary | ICD-10-CM | POA: Diagnosis not present

## 2016-05-30 DIAGNOSIS — M79609 Pain in unspecified limb: Secondary | ICD-10-CM | POA: Diagnosis not present

## 2016-06-01 DIAGNOSIS — J9611 Chronic respiratory failure with hypoxia: Secondary | ICD-10-CM | POA: Diagnosis not present

## 2016-06-01 DIAGNOSIS — J449 Chronic obstructive pulmonary disease, unspecified: Secondary | ICD-10-CM | POA: Diagnosis not present

## 2016-06-01 DIAGNOSIS — Z0001 Encounter for general adult medical examination with abnormal findings: Secondary | ICD-10-CM | POA: Diagnosis not present

## 2016-06-01 DIAGNOSIS — I10 Essential (primary) hypertension: Secondary | ICD-10-CM | POA: Diagnosis not present

## 2016-06-01 DIAGNOSIS — Z79899 Other long term (current) drug therapy: Secondary | ICD-10-CM | POA: Diagnosis not present

## 2016-06-01 DIAGNOSIS — E669 Obesity, unspecified: Secondary | ICD-10-CM | POA: Diagnosis not present

## 2016-06-01 DIAGNOSIS — R0602 Shortness of breath: Secondary | ICD-10-CM | POA: Diagnosis not present

## 2016-06-01 DIAGNOSIS — R5383 Other fatigue: Secondary | ICD-10-CM | POA: Diagnosis not present

## 2016-06-01 DIAGNOSIS — I509 Heart failure, unspecified: Secondary | ICD-10-CM | POA: Diagnosis not present

## 2016-06-02 ENCOUNTER — Other Ambulatory Visit (HOSPITAL_COMMUNITY): Payer: Self-pay | Admitting: Respiratory Therapy

## 2016-06-02 DIAGNOSIS — J441 Chronic obstructive pulmonary disease with (acute) exacerbation: Secondary | ICD-10-CM

## 2016-06-29 DIAGNOSIS — M79609 Pain in unspecified limb: Secondary | ICD-10-CM | POA: Diagnosis not present

## 2016-06-29 DIAGNOSIS — J449 Chronic obstructive pulmonary disease, unspecified: Secondary | ICD-10-CM | POA: Diagnosis not present

## 2016-06-29 DIAGNOSIS — F419 Anxiety disorder, unspecified: Secondary | ICD-10-CM | POA: Diagnosis not present

## 2016-06-29 DIAGNOSIS — I10 Essential (primary) hypertension: Secondary | ICD-10-CM | POA: Diagnosis not present

## 2016-07-08 ENCOUNTER — Ambulatory Visit (HOSPITAL_COMMUNITY): Admission: RE | Admit: 2016-07-08 | Payer: Medicare Other | Source: Ambulatory Visit

## 2016-07-14 DIAGNOSIS — J449 Chronic obstructive pulmonary disease, unspecified: Secondary | ICD-10-CM | POA: Diagnosis not present

## 2016-07-14 DIAGNOSIS — J9611 Chronic respiratory failure with hypoxia: Secondary | ICD-10-CM | POA: Diagnosis not present

## 2016-07-14 DIAGNOSIS — I509 Heart failure, unspecified: Secondary | ICD-10-CM | POA: Diagnosis not present

## 2016-07-14 DIAGNOSIS — I10 Essential (primary) hypertension: Secondary | ICD-10-CM | POA: Diagnosis not present

## 2016-07-27 DIAGNOSIS — I10 Essential (primary) hypertension: Secondary | ICD-10-CM | POA: Diagnosis not present

## 2016-07-27 DIAGNOSIS — Z79899 Other long term (current) drug therapy: Secondary | ICD-10-CM | POA: Diagnosis not present

## 2016-07-27 DIAGNOSIS — J449 Chronic obstructive pulmonary disease, unspecified: Secondary | ICD-10-CM | POA: Diagnosis not present

## 2016-07-27 DIAGNOSIS — G4700 Insomnia, unspecified: Secondary | ICD-10-CM | POA: Diagnosis not present

## 2016-08-03 ENCOUNTER — Ambulatory Visit (HOSPITAL_COMMUNITY): Payer: Medicare Other

## 2016-08-19 ENCOUNTER — Ambulatory Visit (HOSPITAL_COMMUNITY)
Admission: RE | Admit: 2016-08-19 | Discharge: 2016-08-19 | Disposition: A | Discharge status: Home / Self Care | Payer: Medicare Other | Source: Ambulatory Visit | Attending: Pulmonary Disease | Admitting: Pulmonary Disease

## 2016-08-19 DIAGNOSIS — J441 Chronic obstructive pulmonary disease with (acute) exacerbation: Secondary | ICD-10-CM | POA: Insufficient documentation

## 2016-08-19 MED ORDER — ALBUTEROL SULFATE (2.5 MG/3ML) 0.083% IN NEBU
2.5000 mg | INHALATION_SOLUTION | Freq: Once | RESPIRATORY_TRACT | Status: AC
  Administered 2016-08-19: 2.5 mg via RESPIRATORY_TRACT

## 2016-08-22 LAB — PULMONARY FUNCTION TEST
DL/VA % pred: 77 %
DL/VA: 3.64 ml/min/mmHg/L
DLCO unc % pred: 44 %
DLCO unc: 10.23 ml/min/mmHg
FEF 25-75 Post: 0.19 L/sec
FEF 25-75 Pre: 0.2 L/sec
FEF2575-%CHANGE-POST: -6 %
FEF2575-%PRED-PRE: 9 %
FEF2575-%Pred-Post: 8 %
FEV1-%CHANGE-POST: 1 %
FEV1-%PRED-POST: 22 %
FEV1-%PRED-PRE: 22 %
FEV1-Post: 0.54 L
FEV1-Pre: 0.53 L
FEV1FVC-%CHANGE-POST: 5 %
FEV1FVC-%Pred-Pre: 53 %
FEV6-%Change-Post: -4 %
FEV6-%PRED-PRE: 41 %
FEV6-%Pred-Post: 40 %
FEV6-POST: 1.2 L
FEV6-Pre: 1.25 L
FEV6FVC-%Change-Post: -1 %
FEV6FVC-%PRED-POST: 99 %
FEV6FVC-%PRED-PRE: 101 %
FVC-%CHANGE-POST: -3 %
FVC-%PRED-PRE: 41 %
FVC-%Pred-Post: 40 %
FVC-POST: 1.25 L
FVC-PRE: 1.3 L
PRE FEV1/FVC RATIO: 41 %
PRE FEV6/FVC RATIO: 97 %
Post FEV1/FVC ratio: 43 %
Post FEV6/FVC ratio: 95 %
RV % pred: 219 %
RV: 4.4 L
TLC % pred: 121 %
TLC: 5.97 L

## 2016-08-30 DIAGNOSIS — J449 Chronic obstructive pulmonary disease, unspecified: Secondary | ICD-10-CM | POA: Diagnosis not present

## 2016-08-30 DIAGNOSIS — I10 Essential (primary) hypertension: Secondary | ICD-10-CM | POA: Diagnosis not present

## 2016-08-30 DIAGNOSIS — M79609 Pain in unspecified limb: Secondary | ICD-10-CM | POA: Diagnosis not present

## 2016-08-30 DIAGNOSIS — Z79899 Other long term (current) drug therapy: Secondary | ICD-10-CM | POA: Diagnosis not present

## 2016-09-22 DIAGNOSIS — Z23 Encounter for immunization: Secondary | ICD-10-CM | POA: Diagnosis not present

## 2016-09-27 DIAGNOSIS — Z79899 Other long term (current) drug therapy: Secondary | ICD-10-CM | POA: Diagnosis not present

## 2016-09-27 DIAGNOSIS — M2550 Pain in unspecified joint: Secondary | ICD-10-CM | POA: Diagnosis not present

## 2016-09-27 DIAGNOSIS — I10 Essential (primary) hypertension: Secondary | ICD-10-CM | POA: Diagnosis not present

## 2016-09-27 DIAGNOSIS — J449 Chronic obstructive pulmonary disease, unspecified: Secondary | ICD-10-CM | POA: Diagnosis not present

## 2016-10-17 DIAGNOSIS — J9611 Chronic respiratory failure with hypoxia: Secondary | ICD-10-CM | POA: Diagnosis not present

## 2016-10-17 DIAGNOSIS — J449 Chronic obstructive pulmonary disease, unspecified: Secondary | ICD-10-CM | POA: Diagnosis not present

## 2016-10-17 DIAGNOSIS — I509 Heart failure, unspecified: Secondary | ICD-10-CM | POA: Diagnosis not present

## 2016-10-17 DIAGNOSIS — I10 Essential (primary) hypertension: Secondary | ICD-10-CM | POA: Diagnosis not present

## 2016-10-25 DIAGNOSIS — Z79899 Other long term (current) drug therapy: Secondary | ICD-10-CM | POA: Diagnosis not present

## 2016-10-25 DIAGNOSIS — J449 Chronic obstructive pulmonary disease, unspecified: Secondary | ICD-10-CM | POA: Diagnosis not present

## 2016-10-25 DIAGNOSIS — I10 Essential (primary) hypertension: Secondary | ICD-10-CM | POA: Diagnosis not present

## 2016-10-25 DIAGNOSIS — M2550 Pain in unspecified joint: Secondary | ICD-10-CM | POA: Diagnosis not present

## 2016-11-24 DIAGNOSIS — Z79899 Other long term (current) drug therapy: Secondary | ICD-10-CM | POA: Diagnosis not present

## 2016-11-24 DIAGNOSIS — M79609 Pain in unspecified limb: Secondary | ICD-10-CM | POA: Diagnosis not present

## 2016-11-24 DIAGNOSIS — I10 Essential (primary) hypertension: Secondary | ICD-10-CM | POA: Diagnosis not present

## 2016-11-24 DIAGNOSIS — J449 Chronic obstructive pulmonary disease, unspecified: Secondary | ICD-10-CM | POA: Diagnosis not present

## 2016-12-15 DIAGNOSIS — M79609 Pain in unspecified limb: Secondary | ICD-10-CM | POA: Diagnosis not present

## 2016-12-15 DIAGNOSIS — J449 Chronic obstructive pulmonary disease, unspecified: Secondary | ICD-10-CM | POA: Diagnosis not present

## 2016-12-15 DIAGNOSIS — I10 Essential (primary) hypertension: Secondary | ICD-10-CM | POA: Diagnosis not present

## 2016-12-15 DIAGNOSIS — F419 Anxiety disorder, unspecified: Secondary | ICD-10-CM | POA: Diagnosis not present

## 2017-01-16 DIAGNOSIS — I10 Essential (primary) hypertension: Secondary | ICD-10-CM | POA: Diagnosis not present

## 2017-01-16 DIAGNOSIS — Z79899 Other long term (current) drug therapy: Secondary | ICD-10-CM | POA: Diagnosis not present

## 2017-01-16 DIAGNOSIS — M79609 Pain in unspecified limb: Secondary | ICD-10-CM | POA: Diagnosis not present

## 2017-01-16 DIAGNOSIS — J449 Chronic obstructive pulmonary disease, unspecified: Secondary | ICD-10-CM | POA: Diagnosis not present

## 2017-01-18 ENCOUNTER — Other Ambulatory Visit (HOSPITAL_COMMUNITY): Payer: Self-pay | Admitting: Pulmonary Disease

## 2017-01-18 DIAGNOSIS — Z1231 Encounter for screening mammogram for malignant neoplasm of breast: Secondary | ICD-10-CM

## 2017-02-01 ENCOUNTER — Ambulatory Visit (HOSPITAL_COMMUNITY): Payer: Medicare Other

## 2017-02-01 ENCOUNTER — Other Ambulatory Visit: Payer: Medicare Other | Admitting: Adult Health

## 2017-02-14 ENCOUNTER — Other Ambulatory Visit: Payer: Medicare Other | Admitting: Adult Health

## 2017-02-14 DIAGNOSIS — J449 Chronic obstructive pulmonary disease, unspecified: Secondary | ICD-10-CM | POA: Diagnosis not present

## 2017-02-14 DIAGNOSIS — E7800 Pure hypercholesterolemia, unspecified: Secondary | ICD-10-CM | POA: Diagnosis not present

## 2017-02-14 DIAGNOSIS — Z79899 Other long term (current) drug therapy: Secondary | ICD-10-CM | POA: Diagnosis not present

## 2017-02-14 DIAGNOSIS — I10 Essential (primary) hypertension: Secondary | ICD-10-CM | POA: Diagnosis not present

## 2017-02-22 ENCOUNTER — Ambulatory Visit (HOSPITAL_COMMUNITY)
Admission: RE | Admit: 2017-02-22 | Discharge: 2017-02-22 | Disposition: A | Discharge status: Home / Self Care | Payer: Medicare Other | Source: Ambulatory Visit | Attending: Pulmonary Disease | Admitting: Pulmonary Disease

## 2017-02-22 DIAGNOSIS — Z1231 Encounter for screening mammogram for malignant neoplasm of breast: Secondary | ICD-10-CM | POA: Diagnosis not present

## 2017-03-01 ENCOUNTER — Encounter: Payer: Self-pay | Admitting: Adult Health

## 2017-03-01 ENCOUNTER — Ambulatory Visit (INDEPENDENT_AMBULATORY_CARE_PROVIDER_SITE_OTHER): Payer: Medicare Other | Admitting: Adult Health

## 2017-03-01 VITALS — BP 140/88 | HR 67 | Ht 63.5 in | Wt 181.0 lb

## 2017-03-01 DIAGNOSIS — Z01419 Encounter for gynecological examination (general) (routine) without abnormal findings: Secondary | ICD-10-CM

## 2017-03-01 DIAGNOSIS — Z1211 Encounter for screening for malignant neoplasm of colon: Secondary | ICD-10-CM

## 2017-03-01 DIAGNOSIS — Z1212 Encounter for screening for malignant neoplasm of rectum: Secondary | ICD-10-CM

## 2017-03-01 LAB — HEMOCCULT GUIAC POC 1CARD (OFFICE): Fecal Occult Blood, POC: NEGATIVE

## 2017-03-01 NOTE — Progress Notes (Signed)
Patient ID: [name], female   DOB: 20-Apr-1951, 66 y.o.   MRN: [id number] History of Present Illness: [name] is a 66 year old white female in for well woman gyn exam, she is sp hysterectomy. PCP is Dr Willey Blade and she sees Dr Luan Pulling for her COPD. Her partner of 37 years, has liver cancer cancer, stage 4.   Current Medications, Allergies, Past Medical History, Past Surgical History, Family History and Social History were reviewed in [employer] record.     Review of Systems: Patient denies any headaches, hearing loss, fatigue, blurred vision, shortness of breath(uses O2), chest pain, abdominal pain, problems with bowel movements, urination, or intercourse(not having sex). No joint pain or mood swings.    Physical Exam:BP 140/88 (BP Location: Left Arm, Patient Position: Sitting, Cuff Size: Normal)   Pulse 67   Ht 5' 3.5" (1.613 m)   Wt 181 lb (82.1 kg)   BMI 31.56 kg/m  General:  Well developed, well nourished, no acute distress Skin:  Warm and dry Neck:  Midline trachea, normal thyroid, good ROM, no lymphadenopathy,no carotid bruits heard Lungs; Clear to auscultation bilaterally Breast:  No dominant palpable mass, retraction, or nipple discharge Cardiovascular: Regular rate and rhythm Abdomen:  Soft, non tender, no hepatosplenomegaly Pelvic:  External genitalia is normal in appearance, no lesions.  The vagina is pale with loss of moisture and rugae. Urethra has no lesions or masses. The cervix and uterus are absent.   No adnexal masses or tenderness noted.Bladder is non tender, no masses felt. Rectal: Good sphincter tone, no polyps, or hemorrhoids felt.  Hemoccult negative. Extremities/musculoskeletal:  No swelling or varicosities noted, no clubbing or cyanosis, has fatty tumor right upper arm Psych:  No mood changes, alert and cooperative,seems happy PHQ 2 score 0.  Impression: 1. Well woman exam with routine gynecological exam   2. Screening for  colorectal cancer       Plan: Physical in 2 year Labs with PCP  Mammogram yearly Colonoscopy per GI

## 2017-03-13 ENCOUNTER — Other Ambulatory Visit (HOSPITAL_COMMUNITY): Payer: Self-pay | Admitting: Pulmonary Disease

## 2017-03-13 DIAGNOSIS — D179 Benign lipomatous neoplasm, unspecified: Secondary | ICD-10-CM

## 2017-03-22 ENCOUNTER — Ambulatory Visit (HOSPITAL_COMMUNITY)
Admission: RE | Admit: 2017-03-22 | Discharge: 2017-03-22 | Disposition: A | Discharge status: Home / Self Care | Payer: Medicare Other | Source: Ambulatory Visit | Attending: Pulmonary Disease | Admitting: Pulmonary Disease

## 2017-03-22 DIAGNOSIS — D179 Benign lipomatous neoplasm, unspecified: Secondary | ICD-10-CM | POA: Insufficient documentation

## 2017-04-11 DIAGNOSIS — M79609 Pain in unspecified limb: Secondary | ICD-10-CM | POA: Diagnosis not present

## 2017-04-11 DIAGNOSIS — I10 Essential (primary) hypertension: Secondary | ICD-10-CM | POA: Diagnosis not present

## 2017-04-11 DIAGNOSIS — J449 Chronic obstructive pulmonary disease, unspecified: Secondary | ICD-10-CM | POA: Diagnosis not present

## 2017-04-11 DIAGNOSIS — Z79899 Other long term (current) drug therapy: Secondary | ICD-10-CM | POA: Diagnosis not present

## 2017-04-14 DIAGNOSIS — J449 Chronic obstructive pulmonary disease, unspecified: Secondary | ICD-10-CM | POA: Diagnosis not present

## 2017-04-17 DIAGNOSIS — J449 Chronic obstructive pulmonary disease, unspecified: Secondary | ICD-10-CM | POA: Diagnosis not present

## 2017-04-17 DIAGNOSIS — I10 Essential (primary) hypertension: Secondary | ICD-10-CM | POA: Diagnosis not present

## 2017-04-17 DIAGNOSIS — Z79899 Other long term (current) drug therapy: Secondary | ICD-10-CM | POA: Diagnosis not present

## 2017-04-17 DIAGNOSIS — E119 Type 2 diabetes mellitus without complications: Secondary | ICD-10-CM | POA: Diagnosis not present

## 2017-04-21 DIAGNOSIS — Z0001 Encounter for general adult medical examination with abnormal findings: Secondary | ICD-10-CM | POA: Diagnosis not present

## 2017-04-21 DIAGNOSIS — I10 Essential (primary) hypertension: Secondary | ICD-10-CM | POA: Diagnosis not present

## 2017-04-21 DIAGNOSIS — J449 Chronic obstructive pulmonary disease, unspecified: Secondary | ICD-10-CM | POA: Diagnosis not present

## 2017-04-21 DIAGNOSIS — R739 Hyperglycemia, unspecified: Secondary | ICD-10-CM | POA: Diagnosis not present

## 2017-05-10 DIAGNOSIS — Z0001 Encounter for general adult medical examination with abnormal findings: Secondary | ICD-10-CM | POA: Diagnosis not present

## 2017-05-10 DIAGNOSIS — E7800 Pure hypercholesterolemia, unspecified: Secondary | ICD-10-CM | POA: Diagnosis not present

## 2017-05-10 DIAGNOSIS — E876 Hypokalemia: Secondary | ICD-10-CM | POA: Diagnosis not present

## 2017-05-10 DIAGNOSIS — I10 Essential (primary) hypertension: Secondary | ICD-10-CM | POA: Diagnosis not present

## 2017-05-14 DIAGNOSIS — J449 Chronic obstructive pulmonary disease, unspecified: Secondary | ICD-10-CM | POA: Diagnosis not present

## 2017-05-26 DIAGNOSIS — I10 Essential (primary) hypertension: Secondary | ICD-10-CM | POA: Diagnosis not present

## 2017-06-07 DIAGNOSIS — Z79899 Other long term (current) drug therapy: Secondary | ICD-10-CM | POA: Diagnosis not present

## 2017-06-07 DIAGNOSIS — I10 Essential (primary) hypertension: Secondary | ICD-10-CM | POA: Diagnosis not present

## 2017-06-07 DIAGNOSIS — J449 Chronic obstructive pulmonary disease, unspecified: Secondary | ICD-10-CM | POA: Diagnosis not present

## 2017-06-14 DIAGNOSIS — J449 Chronic obstructive pulmonary disease, unspecified: Secondary | ICD-10-CM | POA: Diagnosis not present

## 2017-06-20 ENCOUNTER — Ambulatory Visit (HOSPITAL_COMMUNITY)
Admission: EM | Admit: 2017-06-20 | Discharge: 2017-06-20 | Disposition: A | Discharge status: Home / Self Care | Payer: Medicare Other | Attending: Family Medicine | Admitting: Family Medicine

## 2017-06-20 ENCOUNTER — Encounter (HOSPITAL_COMMUNITY): Payer: Self-pay | Admitting: Emergency Medicine

## 2017-06-20 ENCOUNTER — Ambulatory Visit (INDEPENDENT_AMBULATORY_CARE_PROVIDER_SITE_OTHER): Payer: Medicare Other

## 2017-06-20 DIAGNOSIS — S52132A Displaced fracture of neck of left radius, initial encounter for closed fracture: Secondary | ICD-10-CM | POA: Diagnosis not present

## 2017-06-20 DIAGNOSIS — W19XXXA Unspecified fall, initial encounter: Secondary | ICD-10-CM

## 2017-06-20 MED ORDER — CYCLOBENZAPRINE HCL 5 MG PO TABS: 5.0000 mg | ORAL_TABLET | Freq: Two times a day (BID) | ORAL | 0 refills | Status: DC | PRN

## 2017-06-20 NOTE — ED Notes (Signed)
Ortho Tech paged.

## 2017-06-20 NOTE — ED Provider Notes (Signed)
CSN: [id number]     Arrival date & time 06/20/17  1059 History   None    Chief Complaint  Patient presents with  . Fall   (Consider location/radiation/quality/duration/timing/severity/associated sxs/prior Treatment) 66 yo female with PMH of CHF, COPD, Hypertension, anxiety comes in for 2 day history of left elbow pain after falling. She states there was a step down at the restaurant she didn't notice and tripped and fell. Denies chest pain, shortness of breath, lightheadedness, dizziness, weakness to cause her fall. She has been unable to move her elbow. Left lower arm is swollen. Denies loss of sensation, numbness, tingling.       Past Medical History:  Diagnosis Date  . Anxiety   . Atrophic vaginitis 10/09/2015  . Baker's cyst    left leg  . Blood in urine 10/09/2015  . CHF (congestive heart failure) ([place])   . COPD (chronic obstructive pulmonary disease) ([place])   . Hematuria 10/09/2015  . Hypertension   . Nicotine addiction 04/07/2014  . Pneumonia   . Urinary urgency 01/25/2016  . Vaginal bleeding 10/09/2015   Past Surgical History:  Procedure Laterality Date  . ABDOMINAL HYSTERECTOMY    . CATARACT EXTRACTION Left 03/2015  . CESAREAN SECTION    . COLONOSCOPY  10/19/2004   Dr. Gala Romney- internal hemorrhoids, o/w normal rectum, normal colon  . COLONOSCOPY N/A 07/30/2014   TDD:UKGURKY diverticulosis. Colonic polyp-removed TA. repeat TCS 07/2021  . ESOPHAGOGASTRODUODENOSCOPY N/A 07/30/2014   HCW:CBJSEG EGD   Family History  Problem Relation Age of Onset  . Adopted: Yes  . Cancer Sister        pancreatic  . Migraines Daughter   . Cancer Daughter        pre cancerous cells on cervix  . Cancer Sister        breast cancer, colon  . Blindness Maternal Grandfather   . Other Brother        murdered  . Other Sister        ruptured colon  . Cancer Sister        breast, skin  . Other Brother        was in [place]  . Cancer Sister        pancreatic  . Hypertension Sister   . Other  Sister        ruptured colon  . Colon cancer Neg Hx    Social History  Substance Use Topics  . Smoking status: Former Smoker    Years: 40.00    Types: Cigarettes    Quit date: 04/14/2013  . Smokeless tobacco: Never Used  . Alcohol use Yes     Comment: glass of wine maybe once a year   OB History    Gravida Para Term Preterm AB Living   1 1       1    SAB TAB Ectopic Multiple Live Births           1     Review of Systems  Constitutional: Negative for chills, diaphoresis and fever.  Respiratory: Negative for cough, shortness of breath and wheezing.   Cardiovascular: Negative for chest pain and palpitations.  Musculoskeletal: Positive for arthralgias, joint swelling and myalgias.  Skin: Negative for color change and wound.  Neurological: Negative for dizziness, syncope, weakness, light-headedness, numbness and headaches.    Allergies  Patient has no known allergies.  Home Medications   Prior to Admission medications   Medication Sig Start Date End Date Taking? Authorizing Provider  albuterol (  PROVENTIL HFA;VENTOLIN HFA) 108 (90 BASE) MCG/ACT inhaler Inhale 2 puffs into the lungs every 6 (six) hours as needed. For shortness of breath      [provider]  ALPRAZolam (XANAX) 1 MG tablet Take 1 mg by mouth 2 (two) times daily as needed. For anxiety.    [provider]  cyclobenzaprine (FLEXERIL) 5 MG tablet Take 1 tablet (5 mg total) by mouth 2 (two) times daily as needed for muscle spasms. 06/20/17   Tasia Catchings, Amy V, PA-C  fluticasone furoate-vilanterol (BREO ELLIPTA) 100-25 MCG/INH AEPB Inhale 1 puff into the lungs daily.    [provider]  HYDROcodone-acetaminophen (NORCO) 10-325 MG per tablet 1 tablet 2 (two) times daily as needed.  08/04/14   [provider]  levalbuterol Penne Lash) 1.25 MG/3ML nebulizer solution Take 1.25 mg by nebulization every 4 (four) hours as needed for wheezing.    [provider]  losartan-hydrochlorothiazide  (HYZAAR) 100-25 MG per tablet Take 1 tablet by mouth daily.    [provider]  Multiple Vitamin (MULTIVITAMIN WITH MINERALS) TABS Take 1 tablet by mouth daily.    [provider]  Wheat Dextrin (BENEFIBER PO) Take by mouth as needed.     [provider]   Meds Ordered and Administered this Visit  Medications - No data to display  BP 134/80 (BP Location: Right Arm)   Pulse 86   Temp 98.4 F (36.9 C) (Oral)   Resp 18   SpO2 91%  No data found.   Physical Exam  Constitutional: She is oriented to person, place, and time. She appears well-developed and well-nourished.  Mild distress due to pain  HENT:  Head: Normocephalic and atraumatic.  Eyes: Conjunctivae are normal. Pupils are equal, round, and reactive to light.  Cardiovascular: Normal rate and regular rhythm.  Exam reveals no gallop and no friction rub.   No murmur heard. Pulmonary/Chest: Effort normal. She has wheezes (wheezing throughout, patient states that is her baseline with COPD).  Musculoskeletal:  Patient holding left arm at straight position, unable to bend. Swelling noted. Palpation and ROM deferred due to known fracture from xray results.  Sensation intact. Radial pulse 2+. Capillary refill < 2s  Neurological: She is alert and oriented to person, place, and time.  Skin: Skin is warm and dry. Capillary refill takes less than 2 seconds.  Psychiatric: She has a normal mood and affect. Her behavior is normal. Judgment normal.    Urgent Care Course     Procedures (including critical care time)  Labs Review Labs Reviewed - No data to display  Imaging Review Dg Elbow Complete Left (3+view)  Result Date: 06/20/2017 CLINICAL DATA:  [place] 3 days ago with bruising and swelling, limited range of motion EXAM: LEFT ELBOW - COMPLETE 3+ VIEW COMPARISON:  None. FINDINGS: There is displaced fracture of the left radial neck with the radial head rotated 90 degrees longitudinally. The radial head  projects posteriorly. There is also a small avulsion fracture from the coronoid process of the proximal left ulna with joint effusion and soft tissue swelling. IMPRESSION: 1. Displaced rotated fracture of the left radial neck. 2. Fracture of the coronoid process of the proximal left ulna. Electronically Signed   By: Ivar Drape M.D.   On: 06/20/2017 11:54          MDM   1. Closed displaced fracture of neck of left radius, initial encounter    1. Reviewed imaging results with patient and spouse. Discussed case with on call  orthopedics, Dr Sharol Given, who advised to apply splint and to see patient in office. Patient neurovascularly intact currently. To monitor for loss of sensation, fingers changing colors.  2. Patient with Norco at home, continue as needed for pain control. Patient with history of muscle relaxant use during her ankle fracture with good results. Patient on long term Norco and Xanax use. Reviewed risk for CNS/respiratory depression with patient and Dr Mannie Stabile. Start Flexeril 5mg  BID PRN. Patient and spouse to monitor for CNS/respiratory depression.  Discussed case with Dr Mannie Stabile, who agrees to plan.     Ok Edwards, PA-C 06/20/17 1407

## 2017-06-20 NOTE — ED Triage Notes (Signed)
The patient presented to the [facility] with a complaint of left elbow pain and bilateral knee pain secondary to a fall that occurred Sunday.

## 2017-06-20 NOTE — Progress Notes (Signed)
Orthopedic Tech Progress Note Patient Details:  [name] August 23, 1951 [id number]  Ortho Devices Type of Ortho Device: Ace wrap, Arm sling, Long arm splint Ortho Device/Splint Interventions: Application   [place] Pink 06/20/2017, 1:34 PM

## 2017-06-20 NOTE — ED Notes (Signed)
Ortho Tech advised 

## 2017-06-22 ENCOUNTER — Encounter (INDEPENDENT_AMBULATORY_CARE_PROVIDER_SITE_OTHER): Payer: Self-pay | Admitting: Orthopedic Surgery

## 2017-06-22 ENCOUNTER — Ambulatory Visit (INDEPENDENT_AMBULATORY_CARE_PROVIDER_SITE_OTHER): Payer: Medicare Other | Admitting: Orthopedic Surgery

## 2017-06-22 ENCOUNTER — Other Ambulatory Visit (INDEPENDENT_AMBULATORY_CARE_PROVIDER_SITE_OTHER): Payer: Self-pay | Admitting: Orthopedic Surgery

## 2017-06-22 VITALS — Ht 63.0 in | Wt 181.0 lb

## 2017-06-22 DIAGNOSIS — S52122A Displaced fracture of head of left radius, initial encounter for closed fracture: Secondary | ICD-10-CM

## 2017-06-22 DIAGNOSIS — S53005A Unspecified dislocation of left radial head, initial encounter: Secondary | ICD-10-CM

## 2017-06-22 DIAGNOSIS — S42402A Unspecified fracture of lower end of left humerus, initial encounter for closed fracture: Secondary | ICD-10-CM | POA: Diagnosis not present

## 2017-06-22 NOTE — Progress Notes (Signed)
Unable to get an answer or voice mail on patient's number.  I spoke with daughter [name] who took information and will get it to patients husband- [name].  I instructed patient to call OR desk in am after 0800 for arrival time. I gfave instructions that patient can have a light breakfast by 0730- daughter said yogurt would be patients choice.  I told [name] that patie t can take pain mediaction, Xanax anfd Flexeril, but should be careful.

## 2017-06-23 ENCOUNTER — Ambulatory Visit (HOSPITAL_COMMUNITY): Payer: Medicare Other | Admitting: Certified Registered Nurse Anesthetist

## 2017-06-23 ENCOUNTER — Encounter (HOSPITAL_COMMUNITY)
Admission: RE | Disposition: A | Discharge status: Home / Self Care | Payer: Self-pay | Source: Ambulatory Visit | Attending: Orthopedic Surgery

## 2017-06-23 ENCOUNTER — Ambulatory Visit (HOSPITAL_COMMUNITY)
Admission: RE | Admit: 2017-06-23 | Discharge: 2017-06-23 | Disposition: A | Discharge status: Home / Self Care | Payer: Medicare Other | Source: Ambulatory Visit | Attending: Orthopedic Surgery | Admitting: Orthopedic Surgery

## 2017-06-23 ENCOUNTER — Encounter (HOSPITAL_COMMUNITY): Payer: Self-pay | Admitting: *Deleted

## 2017-06-23 DIAGNOSIS — Z6832 Body mass index (BMI) 32.0-32.9, adult: Secondary | ICD-10-CM | POA: Diagnosis not present

## 2017-06-23 DIAGNOSIS — J449 Chronic obstructive pulmonary disease, unspecified: Secondary | ICD-10-CM | POA: Diagnosis not present

## 2017-06-23 DIAGNOSIS — Z9981 Dependence on supplemental oxygen: Secondary | ICD-10-CM | POA: Insufficient documentation

## 2017-06-23 DIAGNOSIS — I509 Heart failure, unspecified: Secondary | ICD-10-CM | POA: Insufficient documentation

## 2017-06-23 DIAGNOSIS — I110 Hypertensive heart disease with heart failure: Secondary | ICD-10-CM | POA: Insufficient documentation

## 2017-06-23 DIAGNOSIS — S52122A Displaced fracture of head of left radius, initial encounter for closed fracture: Secondary | ICD-10-CM | POA: Insufficient documentation

## 2017-06-23 DIAGNOSIS — Z79899 Other long term (current) drug therapy: Secondary | ICD-10-CM | POA: Diagnosis not present

## 2017-06-23 DIAGNOSIS — W19XXXA Unspecified fall, initial encounter: Secondary | ICD-10-CM | POA: Insufficient documentation

## 2017-06-23 DIAGNOSIS — K219 Gastro-esophageal reflux disease without esophagitis: Secondary | ICD-10-CM | POA: Insufficient documentation

## 2017-06-23 DIAGNOSIS — Z87891 Personal history of nicotine dependence: Secondary | ICD-10-CM | POA: Diagnosis not present

## 2017-06-23 DIAGNOSIS — F419 Anxiety disorder, unspecified: Secondary | ICD-10-CM | POA: Diagnosis not present

## 2017-06-23 DIAGNOSIS — S52042A Displaced fracture of coronoid process of left ulna, initial encounter for closed fracture: Secondary | ICD-10-CM | POA: Diagnosis not present

## 2017-06-23 HISTORY — PX: RADIAL HEAD ARTHROPLASTY: SHX6044

## 2017-06-23 LAB — BASIC METABOLIC PANEL
Anion gap: 8 (ref 5–15)
BUN: 16 mg/dL (ref 6–20)
CALCIUM: 8.7 mg/dL — AB (ref 8.9–10.3)
CO2: 32 mmol/L (ref 22–32)
Chloride: 94 mmol/L — ABNORMAL LOW (ref 101–111)
Creatinine, Ser: 0.55 mg/dL (ref 0.44–1.00)
GFR calc non Af Amer: >60 mL/min (ref >60)
GLUCOSE: 91 mg/dL (ref 65–99)
Potassium: 4 mmol/L (ref 3.5–5.1)
Sodium: 134 mmol/L — ABNORMAL LOW (ref 135–145)

## 2017-06-23 LAB — CBC
HCT: 38.9 % (ref 36.0–46.0)
Hemoglobin: 12.3 g/dL (ref 12.0–15.0)
MCH: 30 pg (ref 26.0–34.0)
MCHC: 31.6 g/dL (ref 30.0–36.0)
MCV: 94.9 fL (ref 78.0–100.0)
PLATELETS: 261 10*3/uL (ref 150–400)
RBC: 4.1 MIL/uL (ref 3.87–5.11)
RDW: 12.5 % (ref 11.5–15.5)
WBC: 9.5 10*3/uL (ref 4.0–10.5)

## 2017-06-23 SURGERY — ARTHROPLASTY, RADIUS, HEAD
Anesthesia: Choice | Site: Elbow | Laterality: Left

## 2017-06-23 MED ORDER — LIDOCAINE-EPINEPHRINE (PF) 1.5 %-1:200000 IJ SOLN
INTRAMUSCULAR | Status: DC | PRN
  Administered 2017-06-23: 5 mL via PERINEURAL

## 2017-06-23 MED ORDER — LACTATED RINGERS IV SOLN
INTRAVENOUS | Status: DC
Start: 2017-06-23 — End: 2017-06-24

## 2017-06-23 MED ORDER — LACTATED RINGERS IV SOLN
INTRAVENOUS | Status: DC | PRN
  Administered 2017-06-23: 18:00:00 via INTRAVENOUS

## 2017-06-23 MED ORDER — MIDAZOLAM HCL 2 MG/2ML IJ SOLN
1.0000 mg | Freq: Once | INTRAMUSCULAR | Status: AC
  Administered 2017-06-23: 1 mg via INTRAVENOUS
  Filled 2017-06-23: qty 1

## 2017-06-23 MED ORDER — 0.9 % SODIUM CHLORIDE (POUR BTL) OPTIME
TOPICAL | Status: DC | PRN
  Administered 2017-06-23: 1000 mL

## 2017-06-23 MED ORDER — FENTANYL CITRATE (PF) 100 MCG/2ML IJ SOLN
50.0000 ug | Freq: Once | INTRAMUSCULAR | Status: AC
  Administered 2017-06-23: 50 ug via INTRAVENOUS
  Filled 2017-06-23: qty 1

## 2017-06-23 MED ORDER — FENTANYL CITRATE (PF) 100 MCG/2ML IJ SOLN
INTRAMUSCULAR | Status: AC
  Administered 2017-06-23: 50 ug via INTRAVENOUS
  Filled 2017-06-23: qty 2

## 2017-06-23 MED ORDER — PROPOFOL 10 MG/ML IV BOLUS
INTRAVENOUS | Status: DC | PRN
  Administered 2017-06-23: 20 mg via INTRAVENOUS

## 2017-06-23 MED ORDER — CEFAZOLIN SODIUM-DEXTROSE 2-4 GM/100ML-% IV SOLN
INTRAVENOUS | Status: AC
Start: 2017-06-23 — End: 2017-06-23
  Filled 2017-06-23: qty 100

## 2017-06-23 MED ORDER — CHLORHEXIDINE GLUCONATE 4 % EX LIQD: 60.0000 mL | Freq: Once | CUTANEOUS | Status: DC

## 2017-06-23 MED ORDER — CEFAZOLIN SODIUM-DEXTROSE 2-4 GM/100ML-% IV SOLN
2.0000 g | INTRAVENOUS | Status: AC
  Administered 2017-06-23: 2 g via INTRAVENOUS

## 2017-06-23 MED ORDER — MIDAZOLAM HCL 5 MG/5ML IJ SOLN
INTRAMUSCULAR | Status: DC | PRN
  Administered 2017-06-23: 2 mg via INTRAVENOUS

## 2017-06-23 MED ORDER — FENTANYL CITRATE (PF) 100 MCG/2ML IJ SOLN: 25.0000 ug | INTRAMUSCULAR | Status: DC | PRN

## 2017-06-23 MED ORDER — MIDAZOLAM HCL 2 MG/2ML IJ SOLN
INTRAMUSCULAR | Status: AC
  Filled 2017-06-23: qty 2

## 2017-06-23 MED ORDER — ONDANSETRON HCL 4 MG/2ML IJ SOLN
INTRAMUSCULAR | Status: DC | PRN
  Administered 2017-06-23: 4 mg via INTRAVENOUS

## 2017-06-23 MED ORDER — PROPOFOL 500 MG/50ML IV EMUL
INTRAVENOUS | Status: DC | PRN
  Administered 2017-06-23: 25 ug/kg/min via INTRAVENOUS

## 2017-06-23 MED ORDER — FENTANYL CITRATE (PF) 100 MCG/2ML IJ SOLN
INTRAMUSCULAR | Status: DC | PRN
  Administered 2017-06-23 (×2): 25 ug via INTRAVENOUS

## 2017-06-23 MED ORDER — MIDAZOLAM HCL 2 MG/2ML IJ SOLN
INTRAMUSCULAR | Status: AC
  Administered 2017-06-23: 1 mg via INTRAVENOUS
  Filled 2017-06-23: qty 2

## 2017-06-23 MED ORDER — FENTANYL CITRATE (PF) 250 MCG/5ML IJ SOLN
INTRAMUSCULAR | Status: AC
  Filled 2017-06-23: qty 5

## 2017-06-23 MED ORDER — BUPIVACAINE-EPINEPHRINE (PF) 0.5% -1:200000 IJ SOLN
INTRAMUSCULAR | Status: DC | PRN
  Administered 2017-06-23: 25 mL via PERINEURAL

## 2017-06-23 SURGICAL SUPPLY — 73 items
BANDAGE ACE 4X5 VEL STRL LF (GAUZE/BANDAGES/DRESSINGS) ×6 IMPLANT
BLADE SAW SAG 9X24 (BLADE) ×3 IMPLANT
BLADE SURG 10 STRL SS (BLADE) ×3 IMPLANT
BNDG ESMARK 4X9 LF (GAUZE/BANDAGES/DRESSINGS) ×3 IMPLANT
BNDG GAUZE ELAST 4 BULKY (GAUZE/BANDAGES/DRESSINGS) IMPLANT
CLOSURE WOUND 1/2 X4 (GAUZE/BANDAGES/DRESSINGS)
CORDS BIPOLAR (ELECTRODE) ×3 IMPLANT
COVER SURGICAL LIGHT HANDLE (MISCELLANEOUS) ×6 IMPLANT
CUFF TOURNIQUET SINGLE 18IN (TOURNIQUET CUFF) ×3 IMPLANT
CUFF TOURNIQUET SINGLE 24IN (TOURNIQUET CUFF) IMPLANT
DRAIN TLS ROUND 10FR (DRAIN) IMPLANT
DRAPE INCISE IOBAN 66X45 STRL (DRAPES) IMPLANT
DRAPE OEC MINIVIEW 54X84 (DRAPES) ×3 IMPLANT
DRAPE U-SHAPE 47X51 STRL (DRAPES) ×3 IMPLANT
DRSG PAD ABDOMINAL 8X10 ST (GAUZE/BANDAGES/DRESSINGS) ×6 IMPLANT
DURAPREP 26ML APPLICATOR (WOUND CARE) ×3 IMPLANT
ELECT REM PT RETURN 9FT ADLT (ELECTROSURGICAL) ×3
ELECTRODE REM PT RTRN 9FT ADLT (ELECTROSURGICAL) ×1 IMPLANT
FACESHIELD WRAPAROUND (MASK) IMPLANT
GAUZE SPONGE 4X4 12PLY STRL (GAUZE/BANDAGES/DRESSINGS) ×6 IMPLANT
GAUZE XEROFORM 1X8 LF (GAUZE/BANDAGES/DRESSINGS) IMPLANT
GAUZE XEROFORM 5X9 LF (GAUZE/BANDAGES/DRESSINGS) ×3 IMPLANT
GLOVE BIO SURGEON ST LM GN SZ9 (GLOVE) ×3 IMPLANT
GLOVE BIOGEL M 7.0 STRL (GLOVE) ×3 IMPLANT
GLOVE BIOGEL PI IND STRL 8 (GLOVE) ×1 IMPLANT
GLOVE BIOGEL PI INDICATOR 8 (GLOVE) ×2
GLOVE INDICATOR 7.0 STRL GRN (GLOVE) ×3 IMPLANT
GLOVE SURG ORTHO 8.0 STRL STRW (GLOVE) ×3 IMPLANT
GOWN STRL REUS W/ TWL LRG LVL3 (GOWN DISPOSABLE) ×3 IMPLANT
GOWN STRL REUS W/ TWL XL LVL3 (GOWN DISPOSABLE) ×1 IMPLANT
GOWN STRL REUS W/TWL 2XL LVL3 (GOWN DISPOSABLE) ×3 IMPLANT
GOWN STRL REUS W/TWL LRG LVL3 (GOWN DISPOSABLE) ×6
GOWN STRL REUS W/TWL XL LVL3 (GOWN DISPOSABLE) ×2
IMPL HEAD (Orthopedic Implant) ×1 IMPLANT
IMPL STEM WITH SCREW 8X29 (Stem) ×1 IMPLANT
IMPLANT HEAD (Orthopedic Implant) ×3 IMPLANT
IMPLANT STEM WITH SCREW 8X29 (Stem) ×3 IMPLANT
KIT BASIN OR (CUSTOM PROCEDURE TRAY) ×3 IMPLANT
KIT ROOM TURNOVER OR (KITS) ×3 IMPLANT
MANIFOLD NEPTUNE II (INSTRUMENTS) IMPLANT
NEEDLE 22X1 1/2 (OR ONLY) (NEEDLE) IMPLANT
NS IRRIG 1000ML POUR BTL (IV SOLUTION) ×3 IMPLANT
PACK ORTHO EXTREMITY (CUSTOM PROCEDURE TRAY) ×3 IMPLANT
PAD ARMBOARD 7.5X6 YLW CONV (MISCELLANEOUS) ×3 IMPLANT
PAD CAST 3X4 CTTN HI CHSV (CAST SUPPLIES) IMPLANT
PAD CAST 4YDX4 CTTN HI CHSV (CAST SUPPLIES) ×2 IMPLANT
PADDING CAST COTTON 3X4 STRL (CAST SUPPLIES)
PADDING CAST COTTON 4X4 STRL (CAST SUPPLIES) ×4
SLING ARM IMMOBILIZER LRG (SOFTGOODS) ×3 IMPLANT
SPONGE LAP 4X18 X RAY DECT (DISPOSABLE) ×6 IMPLANT
STAPLER VISISTAT 35W (STAPLE) IMPLANT
STRIP CLOSURE SKIN 1/2X4 (GAUZE/BANDAGES/DRESSINGS) IMPLANT
SUCTION FRAZIER HANDLE 10FR (MISCELLANEOUS) ×2
SUCTION TUBE FRAZIER 10FR DISP (MISCELLANEOUS) ×1 IMPLANT
SUT ETHILON 3 0 PS 1 (SUTURE) ×9 IMPLANT
SUT PROLENE 3 0 PS 1 (SUTURE) IMPLANT
SUT VIC AB 0 CT1 27 (SUTURE) ×4
SUT VIC AB 0 CT1 27XBRD ANBCTR (SUTURE) ×2 IMPLANT
SUT VIC AB 1 CT1 27 (SUTURE) ×4
SUT VIC AB 1 CT1 27XBRD ANBCTR (SUTURE) ×2 IMPLANT
SUT VIC AB 2-0 CT1 36 (SUTURE) IMPLANT
SUT VIC AB 2-0 CTB1 (SUTURE) ×3 IMPLANT
SUT VIC AB 2-0 SH 27 (SUTURE) ×2
SUT VIC AB 2-0 SH 27XBRD (SUTURE) ×1 IMPLANT
SUT VIC AB 3-0 X1 27 (SUTURE) IMPLANT
SYR CONTROL 10ML LL (SYRINGE) IMPLANT
SYSTEM CHEST DRAIN TLS 7FR (DRAIN) IMPLANT
TOWEL OR 17X24 6PK STRL BLUE (TOWEL DISPOSABLE) IMPLANT
TOWEL OR 17X26 10 PK STRL BLUE (TOWEL DISPOSABLE) ×3 IMPLANT
TUBE CONNECTING 12'X1/4 (SUCTIONS) ×1
TUBE CONNECTING 12X1/4 (SUCTIONS) ×2 IMPLANT
WATER STERILE IRR 1000ML POUR (IV SOLUTION) IMPLANT
YANKAUER SUCT BULB TIP NO VENT (SUCTIONS) IMPLANT

## 2017-06-23 NOTE — Transfer of Care (Signed)
Immediate Anesthesia Transfer of Care Note  Patient: [name]  Procedure(s) Performed: Procedure(s): RADIAL HEAD ARTHROPLASTY WITH LIGAMENT REPAIR (Left)  Patient Location: PACU  Anesthesia Type:MAC  Level of Consciousness: awake  Airway & Oxygen Therapy: Patient Spontanous Breathing and Patient connected to nasal cannula oxygen  Post-op Assessment: Report given to RN and Post -op Vital signs reviewed and stable  Post vital signs: Reviewed and stable  Last Vitals:  Vitals:   06/23/17 1534 06/23/17 2003  BP: (!) 164/76 126/60  Pulse: 77 98  Resp: 18 (!) 21  Temp: 37.2 C 36.9 C    Last Pain:  Vitals:   06/23/17 2003  TempSrc:   PainSc: 0-No pain      Patients Stated Pain Goal: 4 (26/37/85 8850)  Complications: No apparent anesthesia complications

## 2017-06-23 NOTE — H&P (Signed)
[name] is an 66 y.o. female.   Chief Complaint: Left elbow pain HPI: [name] is a 66 year old female who fell and injured her left elbow approximately 5 days ago.  She initially was seen in the urgent care 2 days after injury.  There she was found to have displaced radial head fracture as well as a small coronoid fleck fracture.  She presents now for operative management.  She has significant elbow pain and restriction of motion.  She denies any other orthopedic complaints associated with this injury  Past Medical History:  Diagnosis Date  . Anxiety   . Atrophic vaginitis 10/09/2015  . Baker's cyst    left leg  . Blood in urine 10/09/2015  . CHF (congestive heart failure) ([place])   . COPD (chronic obstructive pulmonary disease) ([place])   . Hematuria 10/09/2015  . Hypertension   . Nicotine addiction 04/07/2014  . Pneumonia   . Urinary urgency 01/25/2016  . Vaginal bleeding 10/09/2015    Past Surgical History:  Procedure Laterality Date  . ABDOMINAL HYSTERECTOMY    . CATARACT EXTRACTION Left 03/2015  . CESAREAN SECTION    . COLONOSCOPY  10/19/2004   Dr. Gala Romney- internal hemorrhoids, o/w normal rectum, normal colon  . COLONOSCOPY N/A 07/30/2014   LNL:GXQJJHE diverticulosis. Colonic polyp-removed TA. repeat TCS 07/2021  . ESOPHAGOGASTRODUODENOSCOPY N/A 07/30/2014   RDE:YCXKGY EGD    Family History  Problem Relation Age of Onset  . Adopted: Yes  . Cancer Sister        pancreatic  . Migraines Daughter   . Cancer Daughter        pre cancerous cells on cervix  . Cancer Sister        breast cancer, colon  . Blindness Maternal Grandfather   . Other Brother        murdered  . Other Sister        ruptured colon  . Cancer Sister        breast, skin  . Other Brother        was in [place]  . Cancer Sister        pancreatic  . Hypertension Sister   . Other Sister        ruptured colon  . Colon cancer Neg Hx    Social History:  reports that she quit smoking about 4 years ago. Her  smoking use included Cigarettes. She quit after 40.00 years of use. She has never used smokeless tobacco. She reports that she drinks alcohol. She reports that she does not use drugs.  Allergies: No Known Allergies  Medications Prior to Admission  Medication Sig Dispense Refill  . albuterol (PROVENTIL HFA;VENTOLIN HFA) 108 (90 BASE) MCG/ACT inhaler Inhale 2 puffs into the lungs every 6 (six) hours as needed. For shortness of breath      . ALPRAZolam (XANAX) 1 MG tablet Take 1 mg by mouth 2 (two) times daily as needed. For anxiety.    . cyclobenzaprine (FLEXERIL) 5 MG tablet Take 1 tablet (5 mg total) by mouth 2 (two) times daily as needed for muscle spasms. 10 tablet 0  . fluticasone furoate-vilanterol (BREO ELLIPTA) 100-25 MCG/INH AEPB Inhale 1 puff into the lungs daily.    Marland Kitchen HYDROcodone-acetaminophen (NORCO) 10-325 MG per tablet 1 tablet 2 (two) times daily as needed.     . levalbuterol (XOPENEX) 1.25 MG/3ML nebulizer solution Take 1.25 mg by nebulization every 4 (four) hours as needed for wheezing.    Marland Kitchen losartan-hydrochlorothiazide (HYZAAR) 100-25 MG  per tablet Take 1 tablet by mouth daily.    . Multiple Vitamin (MULTIVITAMIN WITH MINERALS) TABS Take 1 tablet by mouth daily.    . Wheat Dextrin (BENEFIBER PO) Take by mouth as needed.       Results for orders placed or performed during the hospital encounter of 06/23/17 (from the past 48 hour(s))  Basic metabolic panel     Status: Abnormal   Collection Time: 06/23/17  3:32 PM  Result Value Ref Range   Sodium 134 (L) 135 - 145 mmol/L   Potassium 4.0 3.5 - 5.1 mmol/L   Chloride 94 (L) 101 - 111 mmol/L   CO2 32 22 - 32 mmol/L   Glucose, Bld 91 65 - 99 mg/dL   BUN 16 6 - 20 mg/dL   Creatinine, Ser 0.55 0.44 - 1.00 mg/dL   Calcium 8.7 (L) 8.9 - 10.3 mg/dL   GFR calc non Af Amer >60 >60 mL/min   GFR calc Af Amer >60 >60 mL/min    Comment: (NOTE) The eGFR has been calculated using the CKD EPI equation. This calculation has not been  validated in all clinical situations. eGFR's persistently <60 mL/min signify possible Chronic Kidney Disease.    Anion gap 8 5 - 15  CBC     Status: None   Collection Time: 06/23/17  3:32 PM  Result Value Ref Range   WBC 9.5 4.0 - 10.5 K/uL   RBC 4.10 3.87 - 5.11 MIL/uL   Hemoglobin 12.3 12.0 - 15.0 g/dL   HCT 38.9 36.0 - 46.0 %   MCV 94.9 78.0 - 100.0 fL   MCH 30.0 26.0 - 34.0 pg   MCHC 31.6 30.0 - 36.0 g/dL   RDW 12.5 11.5 - 15.5 %   Platelets 261 150 - 400 K/uL   No results found.  Review of Systems  Musculoskeletal: Positive for joint pain.  All other systems reviewed and are negative.   Blood pressure (!) 164/76, pulse 77, temperature 99 F (37.2 C), temperature source Oral, resp. rate 18, height 5' 3"  (1.6 m), weight 181 lb (82.1 kg), SpO2 95 %. Physical Exam  Constitutional: She appears well-developed.  HENT:  Head: Normocephalic.  Eyes: Conjunctivae are normal.  Neck: Normal range of motion.  Cardiovascular: Normal rate.   Respiratory: Effort normal.  Neurological: She is alert.  Skin: Skin is warm.  Psychiatric: She has a normal mood and affect.   Left elbow demonstrates pain with range of motion radial pulses intact EPL FPL interosseous function is intact.  There is some soft tissue swelling around the elbow but it is to some degree transferred down to the hand.  Shoulder has no tenderness to palpation on the left-hand side  Assessment/Plan Impression is left radial head fracture with very small coronoid fracture.  Plan is removal of radial head with radial head replacement and possible lateral collateral ligament repair.  She's not having much pain or bruising on the medial side.  Risks and benefits are discussed including not limited to infection or vessel damage heterotopic ossification potential need for more surgery.  All questions answered.  Anderson Malta, MD 06/23/2017, 5:33 PM

## 2017-06-23 NOTE — Brief Op Note (Signed)
06/23/2017  7:56 PM  PATIENT:  [name]  66 y.o. female  PRE-OPERATIVE DIAGNOSIS:  LEFT RADIAL HEAD FRACTURE  POST-OPERATIVE DIAGNOSIS:  LEFT RADIAL HEAD FRACTURE  PROCEDURE:  Procedure(s): RADIAL HEAD ARTHROPLASTY WITH LIGAMENT REPAIR  SURGEON:  Surgeon(s): Meredith Pel, MD  ASSISTANT: Darlen Round RNFA  ANESTHESIA:   regional  EBL: 15 ml    No intake/output data recorded.  BLOOD ADMINISTERED: none  DRAINS: none   LOCAL MEDICATIONS USED:  none  SPECIMEN:  No Specimen  COUNTS:  YES  TOURNIQUET:  * Missing tourniquet times found for documented tourniquets in log:  [id number] *  DICTATION: .Other Dictation: Dictation Number [id number]  PLAN OF CARE: Discharge to home after PACU  PATIENT DISPOSITION:  PACU - hemodynamically stable

## 2017-06-23 NOTE — Anesthesia Postprocedure Evaluation (Deleted)
Anesthesia Post Note  Patient: [name]  Procedure(s) Performed: Procedure(s) (LRB): RADIAL HEAD ARTHROPLASTY WITH LIGAMENT REPAIR (Left)     Patient location during evaluation: PACU Anesthesia Type: Spinal Level of consciousness: awake, awake and alert and oriented Pain management: pain level controlled Vital Signs Assessment: post-procedure vital signs reviewed and stable Respiratory status: spontaneous breathing, nonlabored ventilation and respiratory function stable Cardiovascular status: blood pressure returned to baseline Anesthetic complications: no    Last Vitals:  Vitals:   06/23/17 2100 06/23/17 2105  BP: (!) 163/79 (!) 167/79  Pulse:  83  Resp: 16 14  Temp:  36.4 C    Last Pain:  Vitals:   06/23/17 2105  TempSrc:   PainSc: 0-No pain                 Mallary Kreger COKER

## 2017-06-23 NOTE — Addendum Note (Signed)
Addendum  created 06/23/17 2259 by Roberts Gaudy, MD   Delete clinical note, Sign clinical note

## 2017-06-23 NOTE — Anesthesia Preprocedure Evaluation (Signed)
Anesthesia Evaluation  Patient identified by MRN, date of birth, ID band Patient awake    Reviewed: Allergy & Precautions, NPO status , Patient's Chart, lab work & pertinent test results  History of Anesthesia Complications Negative for: history of anesthetic complications  Airway Mallampati: II  TM Distance: <3 FB Neck ROM: Full    Dental  (+) Teeth Intact   Pulmonary COPD,  COPD inhaler and oxygen dependent, former smoker,     + decreased breath sounds      Cardiovascular hypertension, Pt. on medications (-) angina+CHF  (-) Past MI  Rhythm:Regular     Neuro/Psych    GI/Hepatic Neg liver ROS, GERD  Medicated and Controlled,  Endo/Other  Morbid obesity  Renal/GU negative Renal ROS     Musculoskeletal Left elbow fx   Abdominal   Peds  Hematology negative hematology ROS (+) anemia ,   Anesthesia Other Findings   Reproductive/Obstetrics                             Anesthesia Physical Anesthesia Plan  ASA: III  Anesthesia Plan: MAC and Regional   Post-op Pain Management:    Induction: Intravenous  PONV Risk Score and Plan: 2 and Ondansetron and Treatment may vary due to age or medical condition  Airway Management Planned: Nasal Cannula, Natural Airway and Simple Face Mask  Additional Equipment: None  Intra-op Plan:   Post-operative Plan:   Informed Consent: I have reviewed the patients History and Physical, chart, labs and discussed the procedure including the risks, benefits and alternatives for the proposed anesthesia with the patient or authorized representative who has indicated his/her understanding and acceptance.   Dental advisory given  Plan Discussed with: CRNA and Surgeon  Anesthesia Plan Comments:         Anesthesia Quick Evaluation

## 2017-06-23 NOTE — Anesthesia Procedure Notes (Signed)
Anesthesia Regional Block: Supraclavicular block   Pre-Anesthetic Checklist: ,, timeout performed, Correct Patient, Correct Site, Correct Laterality, Correct Procedure, Correct Position, site marked, Risks and benefits discussed,  Surgical consent,  Pre-op evaluation,  At surgeon's request and post-op pain management  Laterality: Upper and Left  Prep: chloraprep       Needles:  Injection technique: Single-shot  Needle Type: Echogenic Needle          Additional Needles:   Procedures: ultrasound guided,,,,,,,,  Narrative:  Start time: 06/23/2017 5:53 PM End time: 06/23/2017 5:59 PM Injection made incrementally with aspirations every 5 mL.  Performed by: Personally   Additional Notes: H+P and labs reviewed, risks and benefits discussed with patient, procedure tolerated well without complications

## 2017-06-23 NOTE — Anesthesia Postprocedure Evaluation (Signed)
Anesthesia Post Note  Patient: [name]  Procedure(s) Performed: Procedure(s) (LRB): RADIAL HEAD ARTHROPLASTY WITH LIGAMENT REPAIR (Left)     Patient location during evaluation: PACU Anesthesia Type: MAC Level of consciousness: awake, awake and alert and oriented Pain management: pain level controlled Vital Signs Assessment: post-procedure vital signs reviewed and stable Respiratory status: spontaneous breathing, nonlabored ventilation and respiratory function stable Cardiovascular status: blood pressure returned to baseline Anesthetic complications: no    Last Vitals:  Vitals:   06/23/17 2100 06/23/17 2105  BP: (!) 163/79 (!) 167/79  Pulse:  83  Resp: 16 14  Temp:  36.4 C    Last Pain:  Vitals:   06/23/17 2105  TempSrc:   PainSc: 0-No pain                 Dalene Robards COKER

## 2017-06-24 NOTE — Progress Notes (Signed)
Office Visit Note   Patient: [name]           Date of Birth: 08-20-1951           MRN: [id number] Visit Date: 06/22/2017 Requested by: Asencion Noble, MD [address] [place], [place] [address] PCP: Asencion Noble, MD  Subjective: Chief Complaint  Patient presents with  . Left Elbow - Fracture    ER follow up DOI 06/20/17 left elbow fx.     HPI: Merelin is a 66 year old female with left elbow pain.  She had a fall 5 days ago.  She went to the urgent care 2 days ago where she was seen to have a radial head fracture as well as coronoid process fracture.  She's been having significant pain in the left arm.  She denies any other orthopedic complaints.  She is on home O2 for COPD              ROS: All systems reviewed are negative as they relate to the chief complaint within the history of present illness.  Patient denies  fevers or chills.   Assessment & Plan: Visit Diagnoses: No diagnosis found.  Plan: Impression is left elbow radial head fracture which is significantly displaced.  There is also a coronoid process fracture which is small.  Plan at this time is for left elbow radial head replacement with possible lateral collateral ligament repair.  Risks and benefits discussed with the patient and her family including not limited to infection or vessel damage instability potential need for more surgery.  Patient understands the risks and benefits and wishes to proceed.  All questions answered  Follow-Up Instructions: No Follow-up on file.   Orders:  No orders of the defined types were placed in this encounter.  No orders of the defined types were placed in this encounter.     Procedures: No procedures performed   Clinical Data: No additional findings.  Objective: Vital Signs: Ht 5\' 3"  (1.6 m)   Wt 181 lb (82.1 kg)   BMI 32.06 kg/m   Physical Exam:   Constitutional: Patient appears well-developed HEENT:  Head: Normocephalic Eyes: Extraocular movements are  somewhat divergent but this is chronic  Neck: Normal range of motion Cardiovascular: Normal rate Pulmonary/chest: Effort normal Neurologic: Patient is alert Skin: Skin is warm Psychiatric: Patient has normal mood and affect    Ortho Exam: Orthopedic exam demonstrates intact EPL FPL interosseous function.  On the left-hand side.  Radial pulse is intact.  She has pain with range of motion of the left elbow along with expected amount of swelling.  Left wrist is nontender.  Left shoulder demonstrates no grinding with range of motion.  Specialty Comments:  No specialty comments available.  Imaging: No results found.   PMFS History: Patient Active Problem List   Diagnosis Date Noted  . Urinary urgency 01/25/2016  . Blood in urine 10/09/2015  . Vaginal bleeding 10/09/2015  . Atrophic vaginitis 10/09/2015  . Hematuria 10/09/2015  . GERD (gastroesophageal reflux disease) 09/04/2014  . Anemia 09/04/2014  . Unspecified constipation 07/14/2014  . Nicotine addiction 04/07/2014   Past Medical History:  Diagnosis Date  . Anxiety   . Atrophic vaginitis 10/09/2015  . Baker's cyst    left leg  . Blood in urine 10/09/2015  . CHF (congestive heart failure) ([place])   . COPD (chronic obstructive pulmonary disease) ([place])   . Hematuria 10/09/2015  . Hypertension   . Nicotine addiction 04/07/2014  . Pneumonia   .  Urinary urgency 01/25/2016  . Vaginal bleeding 10/09/2015    Family History  Problem Relation Age of Onset  . Adopted: Yes  . Cancer Sister        pancreatic  . Migraines Daughter   . Cancer Daughter        pre cancerous cells on cervix  . Cancer Sister        breast cancer, colon  . Blindness Maternal Grandfather   . Other Brother        murdered  . Other Sister        ruptured colon  . Cancer Sister        breast, skin  . Other Brother        was in [place]  . Cancer Sister        pancreatic  . Hypertension Sister   . Other Sister        ruptured colon  . Colon  cancer Neg Hx     Past Surgical History:  Procedure Laterality Date  . ABDOMINAL HYSTERECTOMY    . CATARACT EXTRACTION Left 03/2015  . CESAREAN SECTION    . COLONOSCOPY  10/19/2004   Dr. Gala Romney- internal hemorrhoids, o/w normal rectum, normal colon  . COLONOSCOPY N/A 07/30/2014   PXT:GGYIRSW diverticulosis. Colonic polyp-removed TA. repeat TCS 07/2021  . ESOPHAGOGASTRODUODENOSCOPY N/A 07/30/2014   NIO:EVOJJK EGD   Social History   Occupational History  . Not on file.   Social History Main Topics  . Smoking status: Former Smoker    Years: 40.00    Types: Cigarettes    Quit date: 04/14/2013  . Smokeless tobacco: Never Used  . Alcohol use Yes     Comment: glass of wine maybe once a year  . Drug use: No  . Sexual activity: No     Comment: hyst

## 2017-06-26 ENCOUNTER — Encounter (HOSPITAL_COMMUNITY): Payer: Self-pay | Admitting: Orthopedic Surgery

## 2017-06-26 NOTE — Op Note (Signed)
NAME:  [name], [name]                   ACCOUNT NO.:  MEDICAL RECORD NO.:  [id number]  LOCATION:                                 FACILITY:  PHYSICIAN:  Anderson Malta, M.D.    DATE OF BIRTH:  [birth year]  DATE OF PROCEDURE: DATE OF DISCHARGE:                              OPERATIVE REPORT   PREOPERATIVE DIAGNOSES:  Left radial head fracture and very small coronoid process fracture.  POSTOPERATIVE DIAGNOSES:  Left radial head fracture and very small coronoid process fracture.  PROCEDURE:  Radial head replacement using Biomet 8 stem and 10 x 22 mm metallic head.  SURGEON:  Anderson Malta, M.D.  ASSISTANT:  Dyke Brackett, RNFA.  INDICATIONS:  [name] is a 66 year old patient who fell and injured her left arm.  She has significantly displaced radial head fracture.  She presents now for operative management after explanation of risks and benefits.  PROCEDURE IN DETAIL:  The patient was brought to the operating room, where IV regional anesthetic was induced.  Preoperative antibiotics administered.  Time-out was called.  Left arm was prescrubbed with alcohol and Betadine and allowed to air dry, prepped with DuraPrep solution and draped in a sterile manner.  Charlie Pitter was used to cover the operative field.  Time-out was called.  Arm was elevated, exsanguinated with the Esmarch wrap.  Tourniquet was inflated.  Total tourniquet time approximately 44 minutes at 250 mmHg.  Incision was made at the lateral epicondyle extending distally about 8 cm.  Skin and subcutaneous tissue were sharply divided.  Lateral epicondyle was visualized.  Incision was made at the lateral condyle above the equator along the common extensor origin.  This was extended distally.  The capsule was then incised along with the annular ligament.  Radial head was visualized and removed and sized on the back table to 10 x 22 mm.  The lateral collateral ligament attachment site was intact.  This was inspected significantly  and deliberately and found to be intact.  Thorough irrigation of the joint was performed.  The cut was made on the end of the radius proximally. This was made to flatten out the surface.  At this time, reaming and broaching was performed.  Size 8 was the best fit.  Size 8 stem was placed.  Trial reduction with 10 mm in height head gave excellent stability to varus and valgus stress along with good range of motion without instability.  At this time, thorough irrigation was performed. True components were placed.  Same stability parameters were maintained. The top of the head was approximately at the superior aspect of the annular ligament.  At this time, thorough irrigation was performed. Tourniquet was released.  Bleeding points were controlled with bipolar electrocautery.  Annular ligament and capsule were closed using #1 Vicryl suture.  It should be noted that great care was taken to avoid injury with traction to all tissue around the radial neck.  Gentle traction was done in this location to avoid injury to the posterior interosseous nerve.  All work was done in relative neutral position and care was taken to avoid injury to the posterior interosseous nerve.  The closure was continued  using #1 Vicryl suture to reapproximate the common extensor origin and 0 Vicryl suture, 2-0 Vicryl suture, and 3-0 nylon.  A well-padded posterior splint was applied.  The patient tolerated the procedure well without immediate complications.  Transferred to the recovery room in stable condition.     Anderson Malta, M.D.     GSD/MEDQ  D:  06/23/2017  T:  06/23/2017  Job:  [id number]

## 2017-06-29 ENCOUNTER — Encounter (INDEPENDENT_AMBULATORY_CARE_PROVIDER_SITE_OTHER): Payer: Self-pay | Admitting: Orthopedic Surgery

## 2017-06-29 ENCOUNTER — Ambulatory Visit (INDEPENDENT_AMBULATORY_CARE_PROVIDER_SITE_OTHER): Payer: Medicare Other | Admitting: Orthopedic Surgery

## 2017-06-29 ENCOUNTER — Ambulatory Visit (INDEPENDENT_AMBULATORY_CARE_PROVIDER_SITE_OTHER): Payer: Medicare Other

## 2017-06-29 DIAGNOSIS — S52122D Displaced fracture of head of left radius, subsequent encounter for closed fracture with routine healing: Secondary | ICD-10-CM

## 2017-06-29 DIAGNOSIS — Z96622 Presence of left artificial elbow joint: Secondary | ICD-10-CM

## 2017-06-29 DIAGNOSIS — S52123D Displaced fracture of head of unspecified radius, subsequent encounter for closed fracture with routine healing: Secondary | ICD-10-CM | POA: Insufficient documentation

## 2017-06-29 NOTE — Progress Notes (Signed)
Post-Op Visit Note   Patient: [name]           Date of Birth: 1951/07/09           MRN: [id number] Visit Date: 06/29/2017 PCP: Asencion Noble, MD   Assessment & Plan:  Chief Complaint:  Chief Complaint  Patient presents with  . Left Elbow - Routine Post Op   Visit Diagnoses:  1. Closed displaced fracture of head of left radius with routine healing, subsequent encounter   2. Presence of left artificial elbow joint     Plan: [name] is a 66 year old patient who is now a week out left radial head replacement.  On exam she has pretty reasonable range of motion getting to 20 from full extension to about 100 of flexion.  Incision is intact.  Plan at this time is for Ace wrap to arm start physical therapy for range of motion follow-up in a week for suture removal  Follow-Up Instructions: No Follow-up on file.   Orders:  Orders Placed This Encounter  Procedures  . XR Elbow Complete Left (3+View)   No orders of the defined types were placed in this encounter.   Imaging: Xr Elbow Complete Left (3+view)  Result Date: 06/29/2017 3 views left elbow reviewed.  Radial head prosthesis in good position and alignment.  No complicating features.   PMFS History: Patient Active Problem List   Diagnosis Date Noted  . Presence of left artificial elbow joint 06/29/2017  . Closed displaced fracture of head of radius with routine healing 06/29/2017  . Urinary urgency 01/25/2016  . Blood in urine 10/09/2015  . Vaginal bleeding 10/09/2015  . Atrophic vaginitis 10/09/2015  . Hematuria 10/09/2015  . GERD (gastroesophageal reflux disease) 09/04/2014  . Anemia 09/04/2014  . Unspecified constipation 07/14/2014  . Nicotine addiction 04/07/2014   Past Medical History:  Diagnosis Date  . Anxiety   . Atrophic vaginitis 10/09/2015  . Baker's cyst    left leg  . Blood in urine 10/09/2015  . CHF (congestive heart failure) ([place])   . COPD (chronic obstructive pulmonary disease) ([place])     . Hematuria 10/09/2015  . Hypertension   . Nicotine addiction 04/07/2014  . Pneumonia   . Urinary urgency 01/25/2016  . Vaginal bleeding 10/09/2015    Family History  Problem Relation Age of Onset  . Adopted: Yes  . Cancer Sister        pancreatic  . Migraines Daughter   . Cancer Daughter        pre cancerous cells on cervix  . Cancer Sister        breast cancer, colon  . Blindness Maternal Grandfather   . Other Brother        murdered  . Other Sister        ruptured colon  . Cancer Sister        breast, skin  . Other Brother        was in [place]  . Cancer Sister        pancreatic  . Hypertension Sister   . Other Sister        ruptured colon  . Colon cancer Neg Hx     Past Surgical History:  Procedure Laterality Date  . ABDOMINAL HYSTERECTOMY    . CATARACT EXTRACTION Left 03/2015  . CESAREAN SECTION    . COLONOSCOPY  10/19/2004   Dr. Gala Romney- internal hemorrhoids, o/w normal rectum, normal colon  . COLONOSCOPY N/A 07/30/2014   IOE:VOJJKKX diverticulosis.  Colonic polyp-removed TA. repeat TCS 07/2021  . ESOPHAGOGASTRODUODENOSCOPY N/A 07/30/2014   FUW:TKTCCE EGD  . RADIAL HEAD ARTHROPLASTY Left 06/23/2017   Procedure: RADIAL HEAD ARTHROPLASTY WITH LIGAMENT REPAIR;  Surgeon: Meredith Pel, MD;  Location: [facility];  Service: Orthopedics;  Laterality: Left;   Social History   Occupational History  . Not on file.   Social History Main Topics  . Smoking status: Former Smoker    Years: 40.00    Types: Cigarettes    Quit date: 04/14/2013  . Smokeless tobacco: Never Used  . Alcohol use Yes     Comment: glass of wine maybe once a year  . Drug use: No  . Sexual activity: No     Comment: hyst

## 2017-07-06 ENCOUNTER — Ambulatory Visit (INDEPENDENT_AMBULATORY_CARE_PROVIDER_SITE_OTHER): Payer: Medicare Other | Admitting: Orthopedic Surgery

## 2017-07-06 DIAGNOSIS — S52122D Displaced fracture of head of left radius, subsequent encounter for closed fracture with routine healing: Secondary | ICD-10-CM

## 2017-07-06 NOTE — Progress Notes (Signed)
Post-Op Visit Note   Patient: [name]           Date of Birth: Feb 26, 1951           MRN: [id number] Visit Date: 07/06/2017 PCP: Sinda Du, MD   Assessment & Plan:  Chief Complaint:  Chief Complaint  Patient presents with  . Left Elbow - Pain   Visit Diagnoses:  1. Closed displaced fracture of head of left radius with routine healing, subsequent encounter     Plan: Benjamin is a 66 year old patient left elbow radial head replacement done 2 weeks ago.  Incisions intact sutures removed elbow range of motion is about 10 220.  She's lacking about 40 of full supination but has about 70 of full pronation.  Motor sensory function to the hand is intact.  Plan at this time his home exercise program of flexion and extension pronation supination with no strengthening yet.  See her back in 4 weeks for clinical recheck and likely release to regular activity.  Follow-Up Instructions: Return in about 4 weeks (around 08/03/2017).   Orders:  No orders of the defined types were placed in this encounter.  No orders of the defined types were placed in this encounter.   Imaging: No results found.  PMFS History: Patient Active Problem List   Diagnosis Date Noted  . Presence of left artificial elbow joint 06/29/2017  . Closed displaced fracture of head of radius with routine healing 06/29/2017  . Urinary urgency 01/25/2016  . Blood in urine 10/09/2015  . Vaginal bleeding 10/09/2015  . Atrophic vaginitis 10/09/2015  . Hematuria 10/09/2015  . GERD (gastroesophageal reflux disease) 09/04/2014  . Anemia 09/04/2014  . Unspecified constipation 07/14/2014  . Nicotine addiction 04/07/2014   Past Medical History:  Diagnosis Date  . Anxiety   . Atrophic vaginitis 10/09/2015  . Baker's cyst    left leg  . Blood in urine 10/09/2015  . CHF (congestive heart failure) ([place])   . COPD (chronic obstructive pulmonary disease) ([place])   . Hematuria 10/09/2015  . Hypertension   .  Nicotine addiction 04/07/2014  . Pneumonia   . Urinary urgency 01/25/2016  . Vaginal bleeding 10/09/2015    Family History  Problem Relation Age of Onset  . Adopted: Yes  . Cancer Sister        pancreatic  . Migraines Daughter   . Cancer Daughter        pre cancerous cells on cervix  . Cancer Sister        breast cancer, colon  . Blindness Maternal Grandfather   . Other Brother        murdered  . Other Sister        ruptured colon  . Cancer Sister        breast, skin  . Other Brother        was in [place]  . Cancer Sister        pancreatic  . Hypertension Sister   . Other Sister        ruptured colon  . Colon cancer Neg Hx     Past Surgical History:  Procedure Laterality Date  . ABDOMINAL HYSTERECTOMY    . CATARACT EXTRACTION Left 03/2015  . CESAREAN SECTION    . COLONOSCOPY  10/19/2004   Dr. Gala Romney- internal hemorrhoids, o/w normal rectum, normal colon  . COLONOSCOPY N/A 07/30/2014   TFT:DDUKGUR diverticulosis. Colonic polyp-removed TA. repeat TCS 07/2021  . ESOPHAGOGASTRODUODENOSCOPY N/A 07/30/2014   KY[facility]:CWCBJS EGD  .  RADIAL HEAD ARTHROPLASTY Left 06/23/2017   Procedure: RADIAL HEAD ARTHROPLASTY WITH LIGAMENT REPAIR;  Surgeon: Meredith Pel, MD;  Location: [facility];  Service: Orthopedics;  Laterality: Left;   Social History   Occupational History  . Not on file.   Social History Main Topics  . Smoking status: Former Smoker    Years: 40.00    Types: Cigarettes    Quit date: 04/14/2013  . Smokeless tobacco: Never Used  . Alcohol use Yes     Comment: glass of wine maybe once a year  . Drug use: No  . Sexual activity: No     Comment: hyst

## 2017-07-12 DIAGNOSIS — I10 Essential (primary) hypertension: Secondary | ICD-10-CM | POA: Diagnosis not present

## 2017-07-12 DIAGNOSIS — Z79899 Other long term (current) drug therapy: Secondary | ICD-10-CM | POA: Diagnosis not present

## 2017-07-12 DIAGNOSIS — M79609 Pain in unspecified limb: Secondary | ICD-10-CM | POA: Diagnosis not present

## 2017-07-12 DIAGNOSIS — J449 Chronic obstructive pulmonary disease, unspecified: Secondary | ICD-10-CM | POA: Diagnosis not present

## 2017-07-14 DIAGNOSIS — J449 Chronic obstructive pulmonary disease, unspecified: Secondary | ICD-10-CM | POA: Diagnosis not present

## 2017-07-24 ENCOUNTER — Telehealth (INDEPENDENT_AMBULATORY_CARE_PROVIDER_SITE_OTHER): Payer: Self-pay | Admitting: Orthopedic Surgery

## 2017-07-24 NOTE — Telephone Encounter (Signed)
Records faxed to [facility]

## 2017-08-07 DIAGNOSIS — Z79899 Other long term (current) drug therapy: Secondary | ICD-10-CM | POA: Diagnosis not present

## 2017-08-07 DIAGNOSIS — I10 Essential (primary) hypertension: Secondary | ICD-10-CM | POA: Diagnosis not present

## 2017-08-07 DIAGNOSIS — M79609 Pain in unspecified limb: Secondary | ICD-10-CM | POA: Diagnosis not present

## 2017-08-07 DIAGNOSIS — J449 Chronic obstructive pulmonary disease, unspecified: Secondary | ICD-10-CM | POA: Diagnosis not present

## 2017-08-11 DIAGNOSIS — L409 Psoriasis, unspecified: Secondary | ICD-10-CM | POA: Diagnosis not present

## 2017-08-11 DIAGNOSIS — J9611 Chronic respiratory failure with hypoxia: Secondary | ICD-10-CM | POA: Diagnosis not present

## 2017-08-11 DIAGNOSIS — I10 Essential (primary) hypertension: Secondary | ICD-10-CM | POA: Diagnosis not present

## 2017-08-14 DIAGNOSIS — J449 Chronic obstructive pulmonary disease, unspecified: Secondary | ICD-10-CM | POA: Diagnosis not present

## 2017-08-16 ENCOUNTER — Ambulatory Visit (INDEPENDENT_AMBULATORY_CARE_PROVIDER_SITE_OTHER): Payer: Medicare Other | Admitting: Orthopedic Surgery

## 2017-08-30 ENCOUNTER — Ambulatory Visit (INDEPENDENT_AMBULATORY_CARE_PROVIDER_SITE_OTHER): Payer: Medicare Other | Admitting: Orthopedic Surgery

## 2017-09-05 DIAGNOSIS — R5383 Other fatigue: Secondary | ICD-10-CM | POA: Diagnosis not present

## 2017-09-05 DIAGNOSIS — Z79899 Other long term (current) drug therapy: Secondary | ICD-10-CM | POA: Diagnosis not present

## 2017-09-05 DIAGNOSIS — I10 Essential (primary) hypertension: Secondary | ICD-10-CM | POA: Diagnosis not present

## 2017-09-05 DIAGNOSIS — J449 Chronic obstructive pulmonary disease, unspecified: Secondary | ICD-10-CM | POA: Diagnosis not present

## 2017-09-10 ENCOUNTER — Emergency Department (HOSPITAL_COMMUNITY): Payer: Medicare Other

## 2017-09-10 ENCOUNTER — Emergency Department (HOSPITAL_COMMUNITY)
Admission: EM | Admit: 2017-09-10 | Discharge: 2017-09-10 | Disposition: A | Discharge status: Home / Self Care | Payer: Medicare Other | Attending: Emergency Medicine | Admitting: Emergency Medicine

## 2017-09-10 ENCOUNTER — Encounter (HOSPITAL_COMMUNITY): Payer: Self-pay | Admitting: *Deleted

## 2017-09-10 DIAGNOSIS — Y929 Unspecified place or not applicable: Secondary | ICD-10-CM | POA: Insufficient documentation

## 2017-09-10 DIAGNOSIS — Z87891 Personal history of nicotine dependence: Secondary | ICD-10-CM | POA: Diagnosis not present

## 2017-09-10 DIAGNOSIS — I509 Heart failure, unspecified: Secondary | ICD-10-CM | POA: Insufficient documentation

## 2017-09-10 DIAGNOSIS — Y9301 Activity, walking, marching and hiking: Secondary | ICD-10-CM | POA: Diagnosis not present

## 2017-09-10 DIAGNOSIS — W010XXA Fall on same level from slipping, tripping and stumbling without subsequent striking against object, initial encounter: Secondary | ICD-10-CM | POA: Diagnosis not present

## 2017-09-10 DIAGNOSIS — S81011A Laceration without foreign body, right knee, initial encounter: Secondary | ICD-10-CM | POA: Insufficient documentation

## 2017-09-10 DIAGNOSIS — J449 Chronic obstructive pulmonary disease, unspecified: Secondary | ICD-10-CM | POA: Diagnosis not present

## 2017-09-10 DIAGNOSIS — S8701XA Crushing injury of right knee, initial encounter: Secondary | ICD-10-CM | POA: Diagnosis present

## 2017-09-10 DIAGNOSIS — I110 Hypertensive heart disease with heart failure: Secondary | ICD-10-CM | POA: Diagnosis not present

## 2017-09-10 DIAGNOSIS — Z79899 Other long term (current) drug therapy: Secondary | ICD-10-CM | POA: Diagnosis not present

## 2017-09-10 DIAGNOSIS — Y999 Unspecified external cause status: Secondary | ICD-10-CM | POA: Insufficient documentation

## 2017-09-10 MED ORDER — CEPHALEXIN 500 MG PO CAPS
500.0000 mg | ORAL_CAPSULE | Freq: Two times a day (BID) | ORAL | 0 refills | Status: AC
Start: 2017-09-10 — End: 2017-09-17

## 2017-09-10 MED ORDER — OXYCODONE-ACETAMINOPHEN 5-325 MG PO TABS
1.0000 | ORAL_TABLET | ORAL | Status: DC | PRN
  Administered 2017-09-10: 1 via ORAL

## 2017-09-10 MED ORDER — OXYCODONE HCL 5 MG PO TABS
5.0000 mg | ORAL_TABLET | Freq: Once | ORAL | Status: AC
  Administered 2017-09-10: 5 mg via ORAL
  Filled 2017-09-10: qty 1

## 2017-09-10 MED ORDER — LIDOCAINE-EPINEPHRINE 2 %-1:200000 IJ SOLN
20.0000 mL | Freq: Once | INTRAMUSCULAR | Status: AC
Start: 2017-09-10 — End: 2017-09-10
  Administered 2017-09-10: 20 mL via INTRADERMAL
  Filled 2017-09-10: qty 20

## 2017-09-10 MED ORDER — OXYCODONE-ACETAMINOPHEN 5-325 MG PO TABS: 1.0000 | ORAL_TABLET | Freq: Four times a day (QID) | ORAL | 0 refills | Status: DC | PRN

## 2017-09-10 MED ORDER — OXYCODONE-ACETAMINOPHEN 5-325 MG PO TABS
1.0000 | ORAL_TABLET | Freq: Once | ORAL | Status: AC
  Administered 2017-09-10: 1 via ORAL
  Filled 2017-09-10: qty 1

## 2017-09-10 MED ORDER — CEPHALEXIN 250 MG PO CAPS
500.0000 mg | ORAL_CAPSULE | Freq: Once | ORAL | Status: AC
  Administered 2017-09-10: 500 mg via ORAL
  Filled 2017-09-10: qty 2

## 2017-09-10 MED ORDER — OXYCODONE-ACETAMINOPHEN 5-325 MG PO TABS
ORAL_TABLET | ORAL | Status: AC
  Filled 2017-09-10: qty 1

## 2017-09-10 NOTE — ED Triage Notes (Signed)
Pt reports slipping and falling at home. Has large laceration to right knee, minimal bleeding noted at triage. Clean bandage applied.

## 2017-09-10 NOTE — ED Provider Notes (Addendum)
[facility] DEPT Provider Note   CSN: [id number] Arrival date & time: 09/10/17  4193     History   Chief Complaint Chief Complaint  Patient presents with  . Leg Injury  . Fall    HPI [name] is a 66 y.o. female.  66 year old female with past medical history including COPD on home oxygen, CHF, hypertension who presents with right knee laceration. This afternoon the patient slipped while she was walking on her deck and fell onto her right knee. She sustained a large laceration over her right knee. She reports severe, constant pain despite receiving Percocet in triage. Tetanus is up-to-date. She denies any head injury, loss of consciousness, or other injuries.    The history is provided by the patient.  Fall  Pertinent negatives include no headaches.    Past Medical History:  Diagnosis Date  . Anxiety   . Atrophic vaginitis 10/09/2015  . Baker's cyst    left leg  . Blood in urine 10/09/2015  . CHF (congestive heart failure) ([place])   . COPD (chronic obstructive pulmonary disease) ([place])   . Hematuria 10/09/2015  . Hypertension   . Nicotine addiction 04/07/2014  . Pneumonia   . Urinary urgency 01/25/2016  . Vaginal bleeding 10/09/2015    Patient Active Problem List   Diagnosis Date Noted  . Presence of left artificial elbow joint 06/29/2017  . Closed displaced fracture of head of radius with routine healing 06/29/2017  . Urinary urgency 01/25/2016  . Blood in urine 10/09/2015  . Vaginal bleeding 10/09/2015  . Atrophic vaginitis 10/09/2015  . Hematuria 10/09/2015  . GERD (gastroesophageal reflux disease) 09/04/2014  . Anemia 09/04/2014  . Unspecified constipation 07/14/2014  . Nicotine addiction 04/07/2014    Past Surgical History:  Procedure Laterality Date  . ABDOMINAL HYSTERECTOMY    . CATARACT EXTRACTION Left 03/2015  . CESAREAN SECTION    . COLONOSCOPY  10/19/2004   Dr. Gala Romney- internal hemorrhoids, o/w normal rectum, normal colon  . COLONOSCOPY  N/A 07/30/2014   XTK:[id number] diverticulosis. Colonic polyp-removed TA. repeat TCS 07/2021  . ESOPHAGOGASTRODUODENOSCOPY N/A 07/30/2014   [id number]:[id number] EGD  . RADIAL HEAD ARTHROPLASTY Left 06/23/2017   Procedure: RADIAL HEAD ARTHROPLASTY WITH LIGAMENT REPAIR;  Surgeon: Meredith Pel, MD;  Location: [facility];  Service: Orthopedics;  Laterality: Left;    OB History    Gravida Para Term Preterm AB Living   1 1       1    SAB TAB Ectopic Multiple Live Births           1       Home Medications    Prior to Admission medications   Medication Sig Start Date End Date Taking? Authorizing Provider  albuterol (PROVENTIL HFA;VENTOLIN HFA) 108 (90 BASE) MCG/ACT inhaler Inhale 2 puffs into the lungs every 6 (six) hours as needed. For shortness of breath      [provider]  ALPRAZolam (XANAX) 1 MG tablet Take 1 mg by mouth 2 (two) times daily as needed. For anxiety.    [provider]  cephALEXin (KEFLEX) 500 MG capsule Take 1 capsule (500 mg total) by mouth 2 (two) times daily. 09/10/17 09/17/17  Naftuli Dalsanto, Wenda Overland, MD  cyclobenzaprine (FLEXERIL) 5 MG tablet Take 1 tablet (5 mg total) by mouth 2 (two) times daily as needed for muscle spasms. 06/20/17   Tasia Catchings, Amy V, PA-C  fluticasone furoate-vilanterol (BREO ELLIPTA) 100-25 MCG/INH AEPB Inhale 1 puff into the lungs daily.    [provider]  levalbuterol (XOPENEX) 1.25 MG/3ML nebulizer solution Take 1.25 mg by nebulization every 4 (four) hours as needed for wheezing.    [provider]  losartan-hydrochlorothiazide (HYZAAR) 100-25 MG per tablet Take 1 tablet by mouth daily.    [provider]  Multiple Vitamin (MULTIVITAMIN WITH MINERALS) TABS Take 1 tablet by mouth daily.    [provider]  oxyCODONE (OXY IR/ROXICODONE) 5 MG immediate release tablet Take 5 mg by mouth every 4 (four) hours as needed for severe pain.    [provider]  oxyCODONE-acetaminophen (PERCOCET) 5-325 MG tablet Take 1  tablet by mouth every 6 (six) hours as needed for severe pain. 09/10/17   Trayonna Bachmeier, Wenda Overland, MD  Wheat Dextrin (BENEFIBER PO) Take by mouth as needed.     [provider]    Family History Family History  Problem Relation Age of Onset  . Adopted: Yes  . Cancer Sister        pancreatic  . Migraines Daughter   . Cancer Daughter        pre cancerous cells on cervix  . Cancer Sister        breast cancer, colon  . Blindness Maternal Grandfather   . Other Brother        murdered  . Other Sister        ruptured colon  . Cancer Sister        breast, skin  . Other Brother        was in [place]  . Cancer Sister        pancreatic  . Hypertension Sister   . Other Sister        ruptured colon  . Colon cancer Neg Hx     Social History Social History  Substance Use Topics  . Smoking status: Former Smoker    Years: 40.00    Types: Cigarettes    Quit date: 04/14/2013  . Smokeless tobacco: Never Used  . Alcohol use Yes     Comment: glass of wine maybe once a year     Allergies   Patient has no known allergies.   Review of Systems Review of Systems  Musculoskeletal: Positive for joint swelling.  Skin: Positive for wound.  Neurological: Negative for numbness and headaches.     Physical Exam Updated Vital Signs BP (!) 158/88 (BP Location: Left Arm)   Pulse 90   Temp 98.9 F (37.2 C) (Oral)   Resp 18   SpO2 92%   Physical Exam  Constitutional: She is oriented to person, place, and time. She appears well-developed and well-nourished. No distress.  HENT:  Head: Normocephalic and atraumatic.  Eyes: Conjunctivae are normal.  Neck: Neck supple.  Cardiovascular: Intact distal pulses.   Musculoskeletal:  Large 10cm laceration across middle of anterior R knee over patella, patellar tendon exposure and adipose tissue exposure Able to straight leg raise  Neurological: She is alert and oriented to person, place, and time. No sensory deficit.  Skin: Skin is warm and  dry.  Psychiatric: She has a normal mood and affect. Judgment normal.  Nursing note and vitals reviewed.         ED Treatments / Results  Labs (all labs ordered are listed, but only abnormal results are displayed) Labs Reviewed - No data to display  EKG  EKG Interpretation None       Radiology Dg Knee Complete 4 Views Right  Result Date: 09/10/2017 CLINICAL DATA:  Laceration EXAM: RIGHT KNEE -  COMPLETE 4+ VIEW COMPARISON:  None. FINDINGS: No fracture or dislocation is evident. Trace suprapatellar effusion. Mild patellofemoral, medial and lateral degenerative change. Soft tissue gas adjacent to the medial femoral condyle. No radiopaque foreign body IMPRESSION: 1. No acute osseous abnormality 2. Soft tissue gas adjacent to the medial femoral condyle presumably corresponds to laceration. No radiopaque foreign body Electronically Signed   By: Donavan Foil M.D.   On: 09/10/2017 18:49    Procedures .Marland KitchenLaceration Repair Date/Time: 09/10/2017 9:30 PM Performed by: Sharlett Iles Authorized by: Sharlett Iles   Consent:    Consent obtained:  Verbal   Consent given by:  Patient   Risks discussed:  Infection, need for additional repair, poor cosmetic result and poor wound healing Anesthesia (see MAR for exact dosages):    Anesthesia method:  Local infiltration   Local anesthetic:  Lidocaine 2% WITH epi Laceration details:    Location:  Leg   Leg location:  R knee   Length (cm):  12   Depth (mm):  5 Repair type:    Repair type:  Intermediate Pre-procedure details:    Preparation:  Patient was prepped and draped in usual sterile fashion Exploration:    Wound exploration: wound explored through full range of motion     Contaminated: no   Treatment:    Area cleansed with:  Betadine   Amount of cleaning:  Extensive   Irrigation solution:  Sterile saline   Irrigation volume:  1200   Irrigation method:  Pressure wash Skin repair:    Repair method:  Sutures    Suture size:  4-0   Suture material:  Nylon   Suture technique:  Vertical mattress   Number of sutures:  10 Approximation:    Approximation:  Close Post-procedure details:    Dressing:  Antibiotic ointment, non-adherent dressing and splint for protection   Patient tolerance of procedure:  Tolerated well, no immediate complications   (including critical care time)  Medications Ordered in ED Medications  oxyCODONE-acetaminophen (PERCOCET/ROXICET) 5-325 MG per tablet 1 tablet (1 tablet Oral Given 09/10/17 1813)  lidocaine-EPINEPHrine (XYLOCAINE W/EPI) 2 %-1:200000 (PF) injection 20 mL (not administered)  cephALEXin (KEFLEX) capsule 500 mg (not administered)  oxyCODONE-acetaminophen (PERCOCET/ROXICET) 5-325 MG per tablet 1 tablet (1 tablet Oral Given 09/10/17 2031)  oxyCODONE (Oxy IR/ROXICODONE) immediate release tablet 5 mg (5 mg Oral Given 09/10/17 2031)     Initial Impression / Assessment and Plan / ED Course  I have reviewed the triage vital signs and the nursing notes.  Pertinent imaging results that were available during my care of the patient were reviewed by me and considered in my medical decision making (see chart for details).    Pt w/ large laceration across R knee after fall. XR negative for bony injury. Because of extensive nature of injury, discussed with [facility], Dr. Lorin Mercy. I appreciate his assistance. He advised that it was extremely unlikely that joint space was compromised based on the mechanism of her injury and he recommended usual care with good washout and laceration repair. He also recommended knee immobilizer.  See procedure note for details. Because of the extent of the injury, I did start the patient on antibiotic prophylaxis. Placed in knee immobilizer. Discussed wound care and follow-up in clinic for suture removal in 7-10 days. Return precautions given. Patient and family voiced understanding and she was discharged in satisfactory condition.  Final  Clinical Impressions(s) / ED Diagnoses   Final diagnoses:  Knee laceration, right, initial encounter  New Prescriptions New Prescriptions   CEPHALEXIN (KEFLEX) 500 MG CAPSULE    Take 1 capsule (500 mg total) by mouth 2 (two) times daily.   OXYCODONE-ACETAMINOPHEN (PERCOCET) 5-325 MG TABLET    Take 1 tablet by mouth every 6 (six) hours as needed for severe pain.     Shereka Lafortune, Wenda Overland, MD 09/10/17 2129    Rex Kras Wenda Overland, MD 09/10/17 2131

## 2017-09-10 NOTE — ED Notes (Signed)
ED Provider at bedside. 

## 2017-09-10 NOTE — Discharge Instructions (Signed)
Stay in knee immobilizer when walking/upright. Follow-up with orthopedics clinic in 7-10 days for suture removal and reexamination. Return to ER or clinic immediately if you have any redness, drainage, or other signs of infection.

## 2017-09-10 NOTE — ED Notes (Signed)
Pt departed in NAD.  

## 2017-09-14 DIAGNOSIS — J449 Chronic obstructive pulmonary disease, unspecified: Secondary | ICD-10-CM | POA: Diagnosis not present

## 2017-09-20 ENCOUNTER — Encounter (INDEPENDENT_AMBULATORY_CARE_PROVIDER_SITE_OTHER): Payer: Self-pay | Admitting: Orthopedic Surgery

## 2017-09-20 ENCOUNTER — Ambulatory Visit (INDEPENDENT_AMBULATORY_CARE_PROVIDER_SITE_OTHER): Payer: Medicare Other | Admitting: Orthopedic Surgery

## 2017-09-20 DIAGNOSIS — S81011D Laceration without foreign body, right knee, subsequent encounter: Secondary | ICD-10-CM | POA: Diagnosis not present

## 2017-09-20 NOTE — Progress Notes (Signed)
Office Visit Note   Patient: [name]           Date of Birth: Oct 20, 1951           MRN: [id number] Visit Date: 09/20/2017 Requested by: Sinda Du, [address] [place], [place] [address] PCP: Sinda Du, MD  Subjective: Chief Complaint  Patient presents with  . Right Knee - Injury    HPI: Madisin is a 66 year old patient who sustained laceration to her right knee 10 days ago.  All notes are reviewed from the ER.  Irrigation and closure was performed.  Tetanus was given.  She describes no systemic signs or symptoms of infection.              ROS: All systems reviewed are negative as they relate to the chief complaint within the history of present illness.  Patient denies  fevers or chills.   Assessment & Plan: Visit Diagnoses: No diagnosis found.  Plan: Impression is right knee laceration.  Plan is to remove the sutures.  Steri-Strips applied.  If she develops any significant swelling or fevers or drainage she should come back for this looks reasonably good at this time.  Follow-Up Instructions: No Follow-up on file.   Orders:  No orders of the defined types were placed in this encounter.  No orders of the defined types were placed in this encounter.     Procedures: No procedures performed   Clinical Data: No additional findings.  Objective: Vital Signs: There were no vitals taken for this visit.  Physical Exam:   Constitutional: Patient appears well-developed HEENT:  Head: Normocephalic Eyes:EOM are normal Neck: Normal range of motion Cardiovascular: Normal rate Pulmonary/chest: Effort normal Neurologic: Patient is alert Skin: Skin is warm Psychiatric: Patient has normal mood and affect    Ortho Exam: Orthopedic exam demonstrates well-healed incision transversely across the knee measuring about 8 cm.  The skin is well approximated and has healed nicely.  There is some erythema dependent on elevation which disappears  with elevation in the central portion of the laceration.  I cannot palpate any fluid collection in the prepatellar bursa.  No effusion in the knee.  Specialty Comments:  No specialty comments available.  Imaging: No results found.   PMFS History: Patient Active Problem List   Diagnosis Date Noted  . Presence of left artificial elbow joint 06/29/2017  . Closed displaced fracture of head of radius with routine healing 06/29/2017  . Urinary urgency 01/25/2016  . Blood in urine 10/09/2015  . Vaginal bleeding 10/09/2015  . Atrophic vaginitis 10/09/2015  . Hematuria 10/09/2015  . GERD (gastroesophageal reflux disease) 09/04/2014  . Anemia 09/04/2014  . Unspecified constipation 07/14/2014  . Nicotine addiction 04/07/2014   Past Medical History:  Diagnosis Date  . Anxiety   . Atrophic vaginitis 10/09/2015  . Baker's cyst    left leg  . Blood in urine 10/09/2015  . CHF (congestive heart failure) ([place])   . COPD (chronic obstructive pulmonary disease) ([place])   . Hematuria 10/09/2015  . Hypertension   . Nicotine addiction 04/07/2014  . Pneumonia   . Urinary urgency 01/25/2016  . Vaginal bleeding 10/09/2015    Family History  Problem Relation Age of Onset  . Adopted: Yes  . Cancer Sister        pancreatic  . Migraines Daughter   . Cancer Daughter        pre cancerous cells on cervix  . Cancer Sister  breast cancer, colon  . Blindness Maternal Grandfather   . Other Brother        murdered  . Other Sister        ruptured colon  . Cancer Sister        breast, skin  . Other Brother        was in [place]  . Cancer Sister        pancreatic  . Hypertension Sister   . Other Sister        ruptured colon  . Colon cancer Neg Hx     Past Surgical History:  Procedure Laterality Date  . ABDOMINAL HYSTERECTOMY    . CATARACT EXTRACTION Left 03/2015  . CESAREAN SECTION    . COLONOSCOPY  10/19/2004   Dr. Gala Romney- internal hemorrhoids, o/w normal rectum, normal colon  .  COLONOSCOPY N/A 07/30/2014   TTS:[id number] diverticulosis. Colonic polyp-removed TA. repeat TCS 07/2021  . ESOPHAGOGASTRODUODENOSCOPY N/A 07/30/2014   ZES:[id number] EGD  . RADIAL HEAD ARTHROPLASTY Left 06/23/2017   Procedure: RADIAL HEAD ARTHROPLASTY WITH LIGAMENT REPAIR;  Surgeon: Meredith Pel, MD;  Location: [facility];  Service: Orthopedics;  Laterality: Left;   Social History   Occupational History  . Not on file.   Social History Main Topics  . Smoking status: Former Smoker    Years: 40.00    Types: Cigarettes    Quit date: 04/14/2013  . Smokeless tobacco: Never Used  . Alcohol use Yes     Comment: glass of wine maybe once a year  . Drug use: No  . Sexual activity: No     Comment: hyst

## 2017-10-03 DIAGNOSIS — Z79899 Other long term (current) drug therapy: Secondary | ICD-10-CM | POA: Diagnosis not present

## 2017-10-03 DIAGNOSIS — I10 Essential (primary) hypertension: Secondary | ICD-10-CM | POA: Diagnosis not present

## 2017-10-03 DIAGNOSIS — J449 Chronic obstructive pulmonary disease, unspecified: Secondary | ICD-10-CM | POA: Diagnosis not present

## 2017-10-14 DIAGNOSIS — J449 Chronic obstructive pulmonary disease, unspecified: Secondary | ICD-10-CM | POA: Diagnosis not present

## 2017-10-31 DIAGNOSIS — R5383 Other fatigue: Secondary | ICD-10-CM | POA: Diagnosis not present

## 2017-10-31 DIAGNOSIS — R0602 Shortness of breath: Secondary | ICD-10-CM | POA: Diagnosis not present

## 2017-10-31 DIAGNOSIS — Z79899 Other long term (current) drug therapy: Secondary | ICD-10-CM | POA: Diagnosis not present

## 2017-10-31 DIAGNOSIS — J449 Chronic obstructive pulmonary disease, unspecified: Secondary | ICD-10-CM | POA: Diagnosis not present

## 2017-11-02 DIAGNOSIS — J449 Chronic obstructive pulmonary disease, unspecified: Secondary | ICD-10-CM | POA: Diagnosis not present

## 2017-11-02 DIAGNOSIS — J9611 Chronic respiratory failure with hypoxia: Secondary | ICD-10-CM | POA: Diagnosis not present

## 2017-11-14 DIAGNOSIS — J449 Chronic obstructive pulmonary disease, unspecified: Secondary | ICD-10-CM | POA: Diagnosis not present

## 2017-11-28 DIAGNOSIS — Z961 Presence of intraocular lens: Secondary | ICD-10-CM | POA: Diagnosis not present

## 2017-11-28 DIAGNOSIS — H524 Presbyopia: Secondary | ICD-10-CM | POA: Diagnosis not present

## 2017-11-28 DIAGNOSIS — H52202 Unspecified astigmatism, left eye: Secondary | ICD-10-CM | POA: Diagnosis not present

## 2017-11-28 DIAGNOSIS — H53031 Strabismic amblyopia, right eye: Secondary | ICD-10-CM | POA: Diagnosis not present

## 2017-11-28 DIAGNOSIS — H5202 Hypermetropia, left eye: Secondary | ICD-10-CM | POA: Diagnosis not present

## 2017-11-30 DIAGNOSIS — I10 Essential (primary) hypertension: Secondary | ICD-10-CM | POA: Diagnosis not present

## 2017-11-30 DIAGNOSIS — Z79899 Other long term (current) drug therapy: Secondary | ICD-10-CM | POA: Diagnosis not present

## 2017-11-30 DIAGNOSIS — J449 Chronic obstructive pulmonary disease, unspecified: Secondary | ICD-10-CM | POA: Diagnosis not present

## 2017-12-02 DIAGNOSIS — J449 Chronic obstructive pulmonary disease, unspecified: Secondary | ICD-10-CM | POA: Diagnosis not present

## 2017-12-14 DIAGNOSIS — J449 Chronic obstructive pulmonary disease, unspecified: Secondary | ICD-10-CM | POA: Diagnosis not present

## 2017-12-28 DIAGNOSIS — Z79899 Other long term (current) drug therapy: Secondary | ICD-10-CM | POA: Diagnosis not present

## 2017-12-28 DIAGNOSIS — J449 Chronic obstructive pulmonary disease, unspecified: Secondary | ICD-10-CM | POA: Diagnosis not present

## 2017-12-28 DIAGNOSIS — I10 Essential (primary) hypertension: Secondary | ICD-10-CM | POA: Diagnosis not present

## 2017-12-29 DIAGNOSIS — I10 Essential (primary) hypertension: Secondary | ICD-10-CM | POA: Diagnosis not present

## 2017-12-29 DIAGNOSIS — J9611 Chronic respiratory failure with hypoxia: Secondary | ICD-10-CM | POA: Diagnosis not present

## 2017-12-29 DIAGNOSIS — I509 Heart failure, unspecified: Secondary | ICD-10-CM | POA: Diagnosis not present

## 2017-12-29 DIAGNOSIS — J449 Chronic obstructive pulmonary disease, unspecified: Secondary | ICD-10-CM | POA: Diagnosis not present

## 2018-01-02 DIAGNOSIS — J449 Chronic obstructive pulmonary disease, unspecified: Secondary | ICD-10-CM | POA: Diagnosis not present

## 2018-01-14 DIAGNOSIS — J449 Chronic obstructive pulmonary disease, unspecified: Secondary | ICD-10-CM | POA: Diagnosis not present

## 2018-01-25 DIAGNOSIS — J449 Chronic obstructive pulmonary disease, unspecified: Secondary | ICD-10-CM | POA: Diagnosis not present

## 2018-01-25 DIAGNOSIS — Z79899 Other long term (current) drug therapy: Secondary | ICD-10-CM | POA: Diagnosis not present

## 2018-01-25 DIAGNOSIS — I10 Essential (primary) hypertension: Secondary | ICD-10-CM | POA: Diagnosis not present

## 2018-02-02 DIAGNOSIS — J449 Chronic obstructive pulmonary disease, unspecified: Secondary | ICD-10-CM | POA: Diagnosis not present

## 2018-02-14 DIAGNOSIS — J449 Chronic obstructive pulmonary disease, unspecified: Secondary | ICD-10-CM | POA: Diagnosis not present

## 2018-02-22 DIAGNOSIS — Z79899 Other long term (current) drug therapy: Secondary | ICD-10-CM | POA: Diagnosis not present

## 2018-02-22 DIAGNOSIS — I10 Essential (primary) hypertension: Secondary | ICD-10-CM | POA: Diagnosis not present

## 2018-03-02 DIAGNOSIS — J449 Chronic obstructive pulmonary disease, unspecified: Secondary | ICD-10-CM | POA: Diagnosis not present

## 2018-03-14 DIAGNOSIS — J449 Chronic obstructive pulmonary disease, unspecified: Secondary | ICD-10-CM | POA: Diagnosis not present

## 2018-03-22 DIAGNOSIS — I10 Essential (primary) hypertension: Secondary | ICD-10-CM | POA: Diagnosis not present

## 2018-03-22 DIAGNOSIS — Z79899 Other long term (current) drug therapy: Secondary | ICD-10-CM | POA: Diagnosis not present

## 2018-03-26 ENCOUNTER — Other Ambulatory Visit (HOSPITAL_COMMUNITY): Payer: Self-pay | Admitting: Pulmonary Disease

## 2018-03-26 DIAGNOSIS — Z1231 Encounter for screening mammogram for malignant neoplasm of breast: Secondary | ICD-10-CM

## 2018-03-29 ENCOUNTER — Encounter (INDEPENDENT_AMBULATORY_CARE_PROVIDER_SITE_OTHER): Payer: Self-pay | Admitting: Orthopedic Surgery

## 2018-03-29 ENCOUNTER — Ambulatory Visit (INDEPENDENT_AMBULATORY_CARE_PROVIDER_SITE_OTHER): Payer: Medicare Other | Admitting: Orthopedic Surgery

## 2018-03-29 DIAGNOSIS — S81011D Laceration without foreign body, right knee, subsequent encounter: Secondary | ICD-10-CM | POA: Diagnosis not present

## 2018-04-01 ENCOUNTER — Encounter (INDEPENDENT_AMBULATORY_CARE_PROVIDER_SITE_OTHER): Payer: Self-pay | Admitting: Orthopedic Surgery

## 2018-04-01 NOTE — Progress Notes (Signed)
Office Visit Note   Patient: [name]           Date of Birth: 10/17/1951           MRN: [id number] Visit Date: 03/29/2018 Requested by: Sinda Du, [address] [place], [place] [address] PCP: Sinda Du, MD  Subjective: Chief Complaint  Patient presents with  . Right Knee - Follow-up    HPI: [name] is a patient who had a laceration repaired in the emergency room 09/10/2017.  She had radial head arthroplasty done in June of last year.  She is doing very well from that.  Regarding the right knee she has what she believes is a suture in that knee which is still present.  She is tried to remove it without success.              ROS: All systems reviewed are negative as they relate to the chief complaint within the history of present illness.  Patient denies  fevers or chills.   Assessment & Plan: Visit Diagnoses:  1. Knee laceration, right, subsequent encounter     Plan: Impression is possible retained suture in the right knee.  This area is anesthetized today and under loupe magnification a possible stitch is removed.  This was the offending structure and it was in the dermal tissue.  Measured about 6 mm.  The small 3 mm laceration incurred from the scalpel in order to access this stitch is left open.  I think it should heal in on its own and this should be the end of the problem.  Follow-up with me as needed.  Follow-Up Instructions: Return if symptoms worsen or fail to improve.   Orders:  No orders of the defined types were placed in this encounter.  No orders of the defined types were placed in this encounter.     Procedures: No procedures performed   Clinical Data: No additional findings.  Objective: Vital Signs: There were no vitals taken for this visit.  Physical Exam:   Constitutional: Patient appears well-developed HEENT:  Head: Normocephalic Eyes:EOM are normal Neck: Normal range of motion Cardiovascular: Normal  rate Pulmonary/chest: Effort normal Neurologic: Patient is alert Skin: Skin is warm Psychiatric: Patient has normal mood and affect    Ortho Exam: Orthopedic exam demonstrates excellent right knee range of motion.  Well-healed surgical laceration is present.  There is the top of the suture which is dark which is present on one side of the incision.  There is no erythema purulence fluctuance or really any infection around this area.  It appears that this is some type of suture that is trying to work its way out of the skin.  Only the tip is present in the skin and repeated attempts to grasp it with a needle driver are not successful.  Local anesthetic is utilized to numb up this area incised about 2-3 mm and what appears to be a suture is removed.  Specialty Comments:  No specialty comments available.  Imaging: No results found.   PMFS History: Patient Active Problem List   Diagnosis Date Noted  . Presence of left artificial elbow joint 06/29/2017  . Closed displaced fracture of head of radius with routine healing 06/29/2017  . Urinary urgency 01/25/2016  . Blood in urine 10/09/2015  . Vaginal bleeding 10/09/2015  . Atrophic vaginitis 10/09/2015  . Hematuria 10/09/2015  . GERD (gastroesophageal reflux disease) 09/04/2014  . Anemia 09/04/2014  . Unspecified constipation 07/14/2014  .  Nicotine addiction 04/07/2014   Past Medical History:  Diagnosis Date  . Anxiety   . Atrophic vaginitis 10/09/2015  . Baker's cyst    left leg  . Blood in urine 10/09/2015  . CHF (congestive heart failure) ([place])   . COPD (chronic obstructive pulmonary disease) ([place])   . Hematuria 10/09/2015  . Hypertension   . Nicotine addiction 04/07/2014  . Pneumonia   . Urinary urgency 01/25/2016  . Vaginal bleeding 10/09/2015    Family History  Adopted: Yes  Problem Relation Age of Onset  . Cancer Sister        pancreatic  . Migraines Daughter   . Cancer Daughter        pre cancerous cells on cervix   . Cancer Sister        breast cancer, colon  . Blindness Maternal Grandfather   . Other Brother        murdered  . Other Sister        ruptured colon  . Cancer Sister        breast, skin  . Other Brother        was in [place]  . Cancer Sister        pancreatic  . Hypertension Sister   . Other Sister        ruptured colon  . Colon cancer Neg Hx     Past Surgical History:  Procedure Laterality Date  . ABDOMINAL HYSTERECTOMY    . CATARACT EXTRACTION Left 03/2015  . CESAREAN SECTION    . COLONOSCOPY  10/19/2004   Dr. Gala Romney- internal hemorrhoids, o/w normal rectum, normal colon  . COLONOSCOPY N/A 07/30/2014   OPF:[id number] diverticulosis. Colonic polyp-removed TA. repeat TCS 07/2021  . ESOPHAGOGASTRODUODENOSCOPY N/A 07/30/2014   MMN:[id number] EGD  . RADIAL HEAD ARTHROPLASTY Left 06/23/2017   Procedure: RADIAL HEAD ARTHROPLASTY WITH LIGAMENT REPAIR;  Surgeon: Meredith Pel, MD;  Location: [facility];  Service: Orthopedics;  Laterality: Left;   Social History   Occupational History  . Not on file  Tobacco Use  . Smoking status: Former Smoker    Years: 40.00    Types: Cigarettes    Last attempt to quit: 04/14/2013    Years since quitting: 4.9  . Smokeless tobacco: Never Used  Substance and Sexual Activity  . Alcohol use: Yes    Comment: glass of wine maybe once a year  . Drug use: No  . Sexual activity: Never    Birth control/protection: Surgical    Comment: hyst

## 2018-04-02 DIAGNOSIS — J449 Chronic obstructive pulmonary disease, unspecified: Secondary | ICD-10-CM | POA: Diagnosis not present

## 2018-04-11 ENCOUNTER — Encounter (HOSPITAL_COMMUNITY): Payer: Self-pay

## 2018-04-11 ENCOUNTER — Other Ambulatory Visit: Payer: Medicare Other | Admitting: Adult Health

## 2018-04-11 ENCOUNTER — Ambulatory Visit (HOSPITAL_COMMUNITY): Payer: Medicare Other

## 2018-04-11 ENCOUNTER — Ambulatory Visit (HOSPITAL_COMMUNITY)
Admission: RE | Admit: 2018-04-11 | Discharge: 2018-04-11 | Disposition: A | Discharge status: Home / Self Care | Payer: Medicare Other | Source: Ambulatory Visit | Attending: Pulmonary Disease | Admitting: Pulmonary Disease

## 2018-04-11 DIAGNOSIS — Z1231 Encounter for screening mammogram for malignant neoplasm of breast: Secondary | ICD-10-CM

## 2018-04-14 DIAGNOSIS — J449 Chronic obstructive pulmonary disease, unspecified: Secondary | ICD-10-CM | POA: Diagnosis not present

## 2018-04-19 DIAGNOSIS — M2550 Pain in unspecified joint: Secondary | ICD-10-CM | POA: Diagnosis not present

## 2018-04-19 DIAGNOSIS — I10 Essential (primary) hypertension: Secondary | ICD-10-CM | POA: Diagnosis not present

## 2018-04-19 DIAGNOSIS — M79609 Pain in unspecified limb: Secondary | ICD-10-CM | POA: Diagnosis not present

## 2018-05-02 DIAGNOSIS — J449 Chronic obstructive pulmonary disease, unspecified: Secondary | ICD-10-CM | POA: Diagnosis not present

## 2018-05-14 DIAGNOSIS — J449 Chronic obstructive pulmonary disease, unspecified: Secondary | ICD-10-CM | POA: Diagnosis not present

## 2018-05-15 ENCOUNTER — Ambulatory Visit: Payer: Medicare Other | Admitting: Adult Health

## 2018-05-17 DIAGNOSIS — E7800 Pure hypercholesterolemia, unspecified: Secondary | ICD-10-CM | POA: Diagnosis not present

## 2018-05-17 DIAGNOSIS — R1013 Epigastric pain: Secondary | ICD-10-CM | POA: Diagnosis not present

## 2018-05-17 DIAGNOSIS — Z0001 Encounter for general adult medical examination with abnormal findings: Secondary | ICD-10-CM | POA: Diagnosis not present

## 2018-05-17 DIAGNOSIS — I10 Essential (primary) hypertension: Secondary | ICD-10-CM | POA: Diagnosis not present

## 2018-06-01 DIAGNOSIS — J449 Chronic obstructive pulmonary disease, unspecified: Secondary | ICD-10-CM | POA: Diagnosis not present

## 2018-06-01 DIAGNOSIS — I10 Essential (primary) hypertension: Secondary | ICD-10-CM | POA: Diagnosis not present

## 2018-06-01 DIAGNOSIS — I509 Heart failure, unspecified: Secondary | ICD-10-CM | POA: Diagnosis not present

## 2018-06-01 DIAGNOSIS — J9611 Chronic respiratory failure with hypoxia: Secondary | ICD-10-CM | POA: Diagnosis not present

## 2018-06-02 DIAGNOSIS — J449 Chronic obstructive pulmonary disease, unspecified: Secondary | ICD-10-CM | POA: Diagnosis not present

## 2018-06-14 DIAGNOSIS — J449 Chronic obstructive pulmonary disease, unspecified: Secondary | ICD-10-CM | POA: Diagnosis not present

## 2018-06-15 DIAGNOSIS — J449 Chronic obstructive pulmonary disease, unspecified: Secondary | ICD-10-CM | POA: Diagnosis not present

## 2018-06-15 DIAGNOSIS — M2550 Pain in unspecified joint: Secondary | ICD-10-CM | POA: Diagnosis not present

## 2018-06-15 DIAGNOSIS — Z79899 Other long term (current) drug therapy: Secondary | ICD-10-CM | POA: Diagnosis not present

## 2018-06-15 DIAGNOSIS — M79609 Pain in unspecified limb: Secondary | ICD-10-CM | POA: Diagnosis not present

## 2018-06-22 DIAGNOSIS — I10 Essential (primary) hypertension: Secondary | ICD-10-CM | POA: Diagnosis not present

## 2018-06-22 DIAGNOSIS — E785 Hyperlipidemia, unspecified: Secondary | ICD-10-CM | POA: Diagnosis not present

## 2018-06-22 DIAGNOSIS — J449 Chronic obstructive pulmonary disease, unspecified: Secondary | ICD-10-CM | POA: Diagnosis not present

## 2018-06-22 DIAGNOSIS — Z79899 Other long term (current) drug therapy: Secondary | ICD-10-CM | POA: Diagnosis not present

## 2018-06-29 DIAGNOSIS — I10 Essential (primary) hypertension: Secondary | ICD-10-CM | POA: Diagnosis not present

## 2018-06-29 DIAGNOSIS — Z0001 Encounter for general adult medical examination with abnormal findings: Secondary | ICD-10-CM | POA: Diagnosis not present

## 2018-06-29 DIAGNOSIS — R7303 Prediabetes: Secondary | ICD-10-CM | POA: Diagnosis not present

## 2018-06-29 DIAGNOSIS — R079 Chest pain, unspecified: Secondary | ICD-10-CM | POA: Diagnosis not present

## 2018-07-02 DIAGNOSIS — J449 Chronic obstructive pulmonary disease, unspecified: Secondary | ICD-10-CM | POA: Diagnosis not present

## 2018-07-05 ENCOUNTER — Ambulatory Visit (HOSPITAL_COMMUNITY)
Admission: RE | Admit: 2018-07-05 | Discharge: 2018-07-05 | Disposition: A | Discharge status: Home / Self Care | Payer: Medicare Other | Source: Ambulatory Visit | Attending: Internal Medicine | Admitting: Internal Medicine

## 2018-07-05 ENCOUNTER — Other Ambulatory Visit (HOSPITAL_COMMUNITY): Payer: Self-pay | Admitting: Internal Medicine

## 2018-07-05 DIAGNOSIS — R079 Chest pain, unspecified: Secondary | ICD-10-CM | POA: Diagnosis not present

## 2018-07-05 DIAGNOSIS — R918 Other nonspecific abnormal finding of lung field: Secondary | ICD-10-CM | POA: Diagnosis not present

## 2018-07-09 ENCOUNTER — Other Ambulatory Visit (HOSPITAL_COMMUNITY): Payer: Self-pay | Admitting: Internal Medicine

## 2018-07-09 DIAGNOSIS — R911 Solitary pulmonary nodule: Secondary | ICD-10-CM

## 2018-07-13 ENCOUNTER — Ambulatory Visit (HOSPITAL_COMMUNITY): Payer: Medicare Other

## 2018-07-13 DIAGNOSIS — M79609 Pain in unspecified limb: Secondary | ICD-10-CM | POA: Diagnosis not present

## 2018-07-13 DIAGNOSIS — J449 Chronic obstructive pulmonary disease, unspecified: Secondary | ICD-10-CM | POA: Diagnosis not present

## 2018-07-13 DIAGNOSIS — R5383 Other fatigue: Secondary | ICD-10-CM | POA: Diagnosis not present

## 2018-07-13 DIAGNOSIS — Z79899 Other long term (current) drug therapy: Secondary | ICD-10-CM | POA: Diagnosis not present

## 2018-07-14 DIAGNOSIS — J449 Chronic obstructive pulmonary disease, unspecified: Secondary | ICD-10-CM | POA: Diagnosis not present

## 2018-07-25 ENCOUNTER — Ambulatory Visit (HOSPITAL_COMMUNITY): Payer: Medicare Other

## 2018-07-25 ENCOUNTER — Ambulatory Visit (HOSPITAL_COMMUNITY)
Admission: RE | Admit: 2018-07-25 | Discharge: 2018-07-25 | Disposition: A | Discharge status: Home / Self Care | Payer: Medicare Other | Source: Ambulatory Visit | Attending: Internal Medicine | Admitting: Internal Medicine

## 2018-07-25 DIAGNOSIS — I700 Atherosclerosis of aorta: Secondary | ICD-10-CM | POA: Insufficient documentation

## 2018-07-25 DIAGNOSIS — D3502 Benign neoplasm of left adrenal gland: Secondary | ICD-10-CM | POA: Insufficient documentation

## 2018-07-25 DIAGNOSIS — J439 Emphysema, unspecified: Secondary | ICD-10-CM | POA: Insufficient documentation

## 2018-07-25 DIAGNOSIS — R918 Other nonspecific abnormal finding of lung field: Secondary | ICD-10-CM | POA: Insufficient documentation

## 2018-07-25 DIAGNOSIS — R911 Solitary pulmonary nodule: Secondary | ICD-10-CM | POA: Diagnosis present

## 2018-08-01 DIAGNOSIS — H524 Presbyopia: Secondary | ICD-10-CM | POA: Diagnosis not present

## 2018-08-01 DIAGNOSIS — H53031 Strabismic amblyopia, right eye: Secondary | ICD-10-CM | POA: Diagnosis not present

## 2018-08-01 DIAGNOSIS — Z961 Presence of intraocular lens: Secondary | ICD-10-CM | POA: Diagnosis not present

## 2018-08-01 DIAGNOSIS — H538 Other visual disturbances: Secondary | ICD-10-CM | POA: Diagnosis not present

## 2018-08-01 DIAGNOSIS — H52202 Unspecified astigmatism, left eye: Secondary | ICD-10-CM | POA: Diagnosis not present

## 2018-08-02 DIAGNOSIS — J449 Chronic obstructive pulmonary disease, unspecified: Secondary | ICD-10-CM | POA: Diagnosis not present

## 2018-08-13 DIAGNOSIS — Z79899 Other long term (current) drug therapy: Secondary | ICD-10-CM | POA: Diagnosis not present

## 2018-08-13 DIAGNOSIS — M2550 Pain in unspecified joint: Secondary | ICD-10-CM | POA: Diagnosis not present

## 2018-08-13 DIAGNOSIS — R0602 Shortness of breath: Secondary | ICD-10-CM | POA: Diagnosis not present

## 2018-08-13 DIAGNOSIS — J449 Chronic obstructive pulmonary disease, unspecified: Secondary | ICD-10-CM | POA: Diagnosis not present

## 2018-08-14 DIAGNOSIS — J449 Chronic obstructive pulmonary disease, unspecified: Secondary | ICD-10-CM | POA: Diagnosis not present

## 2018-09-10 DIAGNOSIS — Z23 Encounter for immunization: Secondary | ICD-10-CM | POA: Diagnosis not present

## 2018-09-10 DIAGNOSIS — Z79899 Other long term (current) drug therapy: Secondary | ICD-10-CM | POA: Diagnosis not present

## 2018-09-10 DIAGNOSIS — J9611 Chronic respiratory failure with hypoxia: Secondary | ICD-10-CM | POA: Diagnosis not present

## 2018-09-10 DIAGNOSIS — J449 Chronic obstructive pulmonary disease, unspecified: Secondary | ICD-10-CM | POA: Diagnosis not present

## 2018-09-10 DIAGNOSIS — I10 Essential (primary) hypertension: Secondary | ICD-10-CM | POA: Diagnosis not present

## 2018-09-10 DIAGNOSIS — G4700 Insomnia, unspecified: Secondary | ICD-10-CM | POA: Diagnosis not present

## 2018-09-10 DIAGNOSIS — R5383 Other fatigue: Secondary | ICD-10-CM | POA: Diagnosis not present

## 2018-09-14 DIAGNOSIS — J449 Chronic obstructive pulmonary disease, unspecified: Secondary | ICD-10-CM | POA: Diagnosis not present

## 2018-10-10 DIAGNOSIS — Z79899 Other long term (current) drug therapy: Secondary | ICD-10-CM | POA: Diagnosis not present

## 2018-10-10 DIAGNOSIS — M2550 Pain in unspecified joint: Secondary | ICD-10-CM | POA: Diagnosis not present

## 2018-10-10 DIAGNOSIS — I10 Essential (primary) hypertension: Secondary | ICD-10-CM | POA: Diagnosis not present

## 2018-10-10 DIAGNOSIS — J449 Chronic obstructive pulmonary disease, unspecified: Secondary | ICD-10-CM | POA: Diagnosis not present

## 2018-10-14 DIAGNOSIS — J449 Chronic obstructive pulmonary disease, unspecified: Secondary | ICD-10-CM | POA: Diagnosis not present

## 2018-11-09 ENCOUNTER — Encounter: Payer: Self-pay | Admitting: Internal Medicine

## 2018-11-09 ENCOUNTER — Encounter: Payer: Self-pay | Admitting: Nurse Practitioner

## 2018-11-09 ENCOUNTER — Ambulatory Visit: Payer: Medicare Other | Admitting: Nurse Practitioner

## 2018-11-09 VITALS — BP 151/72 | HR 78 | Temp 97.3°F | Ht 63.0 in | Wt 164.8 lb

## 2018-11-09 DIAGNOSIS — K921 Melena: Secondary | ICD-10-CM | POA: Insufficient documentation

## 2018-11-09 DIAGNOSIS — K5900 Constipation, unspecified: Secondary | ICD-10-CM | POA: Diagnosis not present

## 2018-11-09 NOTE — Patient Instructions (Signed)
1. As we discussed, start taking Benefiber 1 teaspoon twice daily. 2. I am giving you samples of Linzess 72 mcg.  Take this once a day, on an empty stomach. 3. Call us in 1 week and let us know if the Linzess is helping your symptoms. 4. Return for follow-up in 3 months. 5. Call us if you have any questions or concerns.  At [facility] we value your feedback. You may receive a survey about your visit today. Please share your experience as we strive to create trusting relationships with our patients to provide genuine, compassionate, quality care.  We appreciate your understanding and patience as we review any laboratory studies, imaging, and other diagnostic tests that are ordered as we care for you. Our office policy is 5 business days for review of these results, and any emergent or urgent results are addressed in a timely manner for your best interest. If you do not hear from our office in 1 week, please contact us.   We also encourage the use of MyChart, which contains your medical information for your review as well. If you are not enrolled in this feature, an access code is on this after visit summary for your convenience. Thank you for allowing [place] to be involved in your care.  It was great to meet you today!  I hope you have a great Fall!!

## 2018-11-09 NOTE — Assessment & Plan Note (Signed)
History of constipation previously did well on Benefiber twice a day and Linzess 145 mcg.  Although, she does describe that [place] did cause some diarrhea for her.  We currently have a lower dose option.  At this point I will start her on Linzess 72 mcg once daily on an empty stomach with samples to last 1 week.  I requested a progress report in 1 week.  Consider further medication changes over the phone depending on her progression.  Start Benefiber as well 1 teaspoon twice daily.  Return for follow-up in 3 months.

## 2018-11-09 NOTE — Progress Notes (Signed)
Primary Care Physician:  Sinda Du, MD Primary Gastroenterologist:  Dr. Gala Romney  Chief Complaint  Patient presents with  . Constipation    straings with BM, blood in stool at times    HPI:   [name] is a 67 y.o. female who presents for follow-up on constipation. The patient has not been seen by our office since 09/04/2014 when she was seen for constipation, GERD, anemia.  At that time it was noted she had a recent EGD/colonoscopy.  EGD was unremarkable, colonoscopy was scattered diverticula and a 7 mm tubular adenoma polyp status post removal.  At that time she was taken 2 teaspoons of Benefiber twice daily, increased healthier diet, increased fiber and Linzess daily.  Noted bowel movement to every morning the first 1 solid and excellent watery.  GERD well managed.  She was briefly seen by oncology for lymphocytosis, anemia, mild thrombocytopenia initially thought to be CLL but testing came back normal and her hemoglobin rebounded, lymphocytosis resolved.  It was suspected allergic/allergen related reaction.  Recommended CBC in 3 months, continue Dexilant and Linzess.  Her previous colonoscopy was 07/31/2014 which found a 7 mm tubular adenoma.  Recommended repeat colonoscopy in 7 years (2022).  Today she states she's having worsening constipation. She had a previous colonoscopy in 2015 and states she hurt quite a lot with that. She tried MgCitrate a couple weeks ago which allowed a productive bowel movement. Constipation started worsening a couple weeks ago. Previously was having regular bowel movements daily, consistent with Bristol 4. Has been on her pain medications for 10+ years. Is eating more fast food then normal, less fiber. Not using Benefiber. Was on abx recently by PCP for URI symptoms. Wears home O2 2.5 lpm which helps. With her recent constipation has had stools consistent with Bristol 1-2, straining, some scant toilet tissue hematochezia. Notes postprandial bloating. Has  some intermittent generalized abdominal discomfort which significantly improved after Mag Citrate bowel movement. Denies melena, fever, chills, unintentional weight loss. Denies chest pain, dyspnea, dizziness, lightheadedness, syncope, near syncope. Denies any other upper or lower GI symptoms.  Past Medical History:  Diagnosis Date  . Anxiety   . Atrophic vaginitis 10/09/2015  . Baker's cyst    left leg  . Blood in urine 10/09/2015  . CHF (congestive heart failure) ([place])   . COPD (chronic obstructive pulmonary disease) ([place])   . Hematuria 10/09/2015  . Hypertension   . Nicotine addiction 04/07/2014  . Pneumonia   . Urinary urgency 01/25/2016  . Vaginal bleeding 10/09/2015    Past Surgical History:  Procedure Laterality Date  . ABDOMINAL HYSTERECTOMY    . CATARACT EXTRACTION Left 03/2015  . CESAREAN SECTION    . COLONOSCOPY  10/19/2004   Dr. Gala Romney- internal hemorrhoids, o/w normal rectum, normal colon  . COLONOSCOPY N/A 07/30/2014   [id number] diverticulosis. Colonic polyp-removed TA. repeat TCS 07/2021  . ESOPHAGOGASTRODUODENOSCOPY N/A 07/30/2014   [id number] EGD  . RADIAL HEAD ARTHROPLASTY Left 06/23/2017   Procedure: RADIAL HEAD ARTHROPLASTY WITH LIGAMENT REPAIR;  Surgeon: Meredith Pel, MD;  Location: [facility];  Service: Orthopedics;  Laterality: Left;    Current Outpatient Medications  Medication Sig Dispense Refill  . albuterol (PROVENTIL HFA;VENTOLIN HFA) 108 (90 BASE) MCG/ACT inhaler Inhale 2 puffs into the lungs every 6 (six) hours as needed. For shortness of breath      . albuterol (PROVENTIL) (2.5 MG/3ML) 0.083% nebulizer solution Take 2.5 mg by nebulization every 6 (six) hours as needed for wheezing or  shortness of breath.    Geronimo Boot XT 180 MG 24 hr capsule Take 1 tablet by mouth 2 (two) times daily.  4  . Fluticasone-Umeclidin-Vilant (TRELEGY ELLIPTA IN) Inhale into the lungs daily.    . furosemide (LASIX) 40 MG tablet Take 40 mg by mouth daily.    . Multiple Vitamin  (MULTIVITAMIN WITH MINERALS) TABS Take 1 tablet by mouth daily.    Marland Kitchen oxyCODONE-acetaminophen (PERCOCET) 5-325 MG tablet Take 1 tablet by mouth every 6 (six) hours as needed for severe pain. 6 tablet 0  . potassium chloride (KLOR-CON) 20 MEQ packet Take 20 mEq by mouth daily.     No current facility-administered medications for this visit.     Allergies as of 11/09/2018  . (No Known Allergies)    Family History  Adopted: Yes  Problem Relation Age of Onset  . Cancer Sister        pancreatic  . Migraines Daughter   . Cancer Daughter        pre cancerous cells on cervix  . Cancer Sister        breast cancer, colon  . Blindness Maternal Grandfather   . Other Brother        murdered  . Other Sister        ruptured colon  . Cancer Sister        breast, skin  . Other Brother        was in [place]  . Cancer Sister        pancreatic  . Hypertension Sister   . Other Sister        ruptured colon  . Colon cancer Neg Hx     Social History   Socioeconomic History  . Marital status: Single    Spouse name: Not on file  . Number of children: Not on file  . Years of education: Not on file  . Highest education level: Not on file  Occupational History  . Not on file  Social Needs  . Financial resource strain: Not on file  . Food insecurity:    Worry: Not on file    Inability: Not on file  . Transportation needs:    Medical: Not on file    Non-medical: Not on file  Tobacco Use  . Smoking status: Former Smoker    Years: 40.00    Types: Cigarettes    Last attempt to quit: 04/14/2013    Years since quitting: 5.5  . Smokeless tobacco: Never Used  Substance and Sexual Activity  . Alcohol use: Yes    Comment: glass of wine maybe once a year  . Drug use: No  . Sexual activity: Never    Birth control/protection: Surgical    Comment: hyst  Lifestyle  . Physical activity:    Days per week: Not on file    Minutes per session: Not on file  . Stress: Not on file  Relationships  .  Social connections:    Talks on phone: Not on file    Gets together: Not on file    Attends religious service: Not on file    Active member of club or organization: Not on file    Attends meetings of clubs or organizations: Not on file    Relationship status: Not on file  . Intimate partner violence:    Fear of current or ex partner: Not on file    Emotionally abused: Not on file    Physically abused: Not on file  Forced sexual activity: Not on file  Other Topics Concern  . Not on file  Social History Narrative  . Not on file    Review of Systems: General: Negative for anorexia, weight loss, fever, chills, fatigue, weakness. ENT: Negative for hoarseness, difficulty swallowing. CV: Negative for chest pain, angina, palpitations, peripheral edema.  Respiratory: Negative for dyspnea at rest, cough, sputum, wheezing.  GI: See history of present illness. MS: Negative for joint pain, low back pain.  Derm: Negative for rash or itching.  Endo: Negative for unusual weight change.  Heme: Negative for bruising or bleeding. Allergy: Negative for rash or hives.    Physical Exam: BP (!) 151/72   Pulse 78   Temp (!) 97.3 F (36.3 C) (Oral)   Ht 5\' 3"  (1.6 m)   Wt 164 lb 12.8 oz (74.8 kg)   BMI 29.19 kg/m  General:   Alert and oriented. Pleasant and cooperative. Well-nourished and well-developed.  Head:  Normocephalic and atraumatic. Eyes:  Without icterus, sclera clear and conjunctiva pink.  Ears:  Normal auditory acuity. Cardiovascular:  S1, S2 present without murmurs appreciated. Extremities without clubbing or edema. Respiratory:  Mild wheezing bilaterally. No rales, or rhonchi. No distress. O2 per [place] in place at 3 lpm Gastrointestinal:  +BS, soft, and non-distended. Minimal to mild TTP generalized abdomen. No HSM noted. No guarding or rebound. No masses appreciated.  Rectal:  Deferred  Musculoskalatal:  Symmetrical without gross deformities. Neurologic:  Alert and oriented x4;   grossly normal neurologically. Psych:  Alert and cooperative. Normal mood and affect. Heme/Lymph/Immune: No excessive bruising noted.    11/09/2018 11:12 AM   Disclaimer: This note was dictated with voice recognition software. Similar sounding words can inadvertently be transcribed and may not be corrected upon review.

## 2018-11-09 NOTE — Assessment & Plan Note (Signed)
The patient describes scant toilet tissue hematochezia in the setting of worsening constipation.  Last colonoscopy about 4 years ago and recommended 7-year repeat exam.  She states when she was constipated she could feel inflamed hemorrhoids at her anal verge.  At this point we will attempt to better control her constipation and reevaluate for further rectal bleeding.  Consider early interval colonoscopy depending on clinical progression.

## 2018-11-12 DIAGNOSIS — M2550 Pain in unspecified joint: Secondary | ICD-10-CM | POA: Diagnosis not present

## 2018-11-12 DIAGNOSIS — R51 Headache: Secondary | ICD-10-CM | POA: Diagnosis not present

## 2018-11-12 DIAGNOSIS — R5383 Other fatigue: Secondary | ICD-10-CM | POA: Diagnosis not present

## 2018-11-12 DIAGNOSIS — Z79899 Other long term (current) drug therapy: Secondary | ICD-10-CM | POA: Diagnosis not present

## 2018-11-12 NOTE — Progress Notes (Signed)
CC'D TO PCP °

## 2018-11-14 DIAGNOSIS — J449 Chronic obstructive pulmonary disease, unspecified: Secondary | ICD-10-CM | POA: Diagnosis not present

## 2018-11-15 ENCOUNTER — Telehealth: Payer: Self-pay | Admitting: Internal Medicine

## 2018-11-15 DIAGNOSIS — K5900 Constipation, unspecified: Secondary | ICD-10-CM

## 2018-11-15 MED ORDER — LINACLOTIDE 72 MCG PO CAPS: 72.0000 ug | ORAL_CAPSULE | Freq: Every day | ORAL | 5 refills | Status: DC

## 2018-11-15 NOTE — Telephone Encounter (Signed)
Noted. Pt is aware that RX was sent to pharmacy.

## 2018-11-15 NOTE — Telephone Encounter (Signed)
PATIENT CALLED AND SAID LINZESS HELPED, HER KIDNEYS HURT LAST NIGHT THOUGH AND WANTS TO KNOW IF THAT WAS A SIDE AFFECT.  PLEASE CALL [phone]

## 2018-11-15 NOTE — Telephone Encounter (Signed)
Spoke with pt. She is taking the samples Linzess 77mcg. She started having pain where her kidneys are located and wants to know if the Linzess can cause that back pain. Pt would like an Rx sent to her pharmacy if it doesn't.

## 2018-11-15 NOTE — Addendum Note (Signed)
Addended by: Gordy Levan,  A on: 11/15/2018 01:19 PM   Modules accepted: Orders

## 2018-11-15 NOTE — Telephone Encounter (Signed)
It is not a known side effect.  Rx was sent to pharmacy, please notify the patient.

## 2018-11-30 DIAGNOSIS — J441 Chronic obstructive pulmonary disease with (acute) exacerbation: Secondary | ICD-10-CM | POA: Diagnosis not present

## 2018-12-04 DIAGNOSIS — M2550 Pain in unspecified joint: Secondary | ICD-10-CM | POA: Diagnosis not present

## 2018-12-04 DIAGNOSIS — I10 Essential (primary) hypertension: Secondary | ICD-10-CM | POA: Diagnosis not present

## 2018-12-04 DIAGNOSIS — R0602 Shortness of breath: Secondary | ICD-10-CM | POA: Diagnosis not present

## 2018-12-04 DIAGNOSIS — Z9981 Dependence on supplemental oxygen: Secondary | ICD-10-CM | POA: Diagnosis not present

## 2018-12-14 DIAGNOSIS — J449 Chronic obstructive pulmonary disease, unspecified: Secondary | ICD-10-CM | POA: Diagnosis not present

## 2018-12-21 ENCOUNTER — Other Ambulatory Visit: Payer: Self-pay

## 2018-12-21 NOTE — Patient Outreach (Signed)
[facility]) Care Management  12/21/2018  AIMIE WAGMAN 08-25-1951 [id number]   Medication Adherence call to Mrs. Nichelle Renwick left a message for patient to call back patient is due on Losartan/Hctz 100/25 mg. Mrs. Barra is showing past due under [place].   [facility] Management Direct Dial [phone]  Fax [phone] [email or web address]

## 2019-01-01 DIAGNOSIS — Z79899 Other long term (current) drug therapy: Secondary | ICD-10-CM | POA: Diagnosis not present

## 2019-01-01 DIAGNOSIS — I10 Essential (primary) hypertension: Secondary | ICD-10-CM | POA: Diagnosis not present

## 2019-01-01 DIAGNOSIS — J449 Chronic obstructive pulmonary disease, unspecified: Secondary | ICD-10-CM | POA: Diagnosis not present

## 2019-01-01 DIAGNOSIS — M2550 Pain in unspecified joint: Secondary | ICD-10-CM | POA: Diagnosis not present

## 2019-01-14 DIAGNOSIS — J449 Chronic obstructive pulmonary disease, unspecified: Secondary | ICD-10-CM | POA: Diagnosis not present

## 2019-01-22 DIAGNOSIS — J9611 Chronic respiratory failure with hypoxia: Secondary | ICD-10-CM | POA: Diagnosis not present

## 2019-01-22 DIAGNOSIS — I509 Heart failure, unspecified: Secondary | ICD-10-CM | POA: Diagnosis not present

## 2019-01-22 DIAGNOSIS — I10 Essential (primary) hypertension: Secondary | ICD-10-CM | POA: Diagnosis not present

## 2019-01-22 DIAGNOSIS — J449 Chronic obstructive pulmonary disease, unspecified: Secondary | ICD-10-CM | POA: Diagnosis not present

## 2019-01-29 DIAGNOSIS — J449 Chronic obstructive pulmonary disease, unspecified: Secondary | ICD-10-CM | POA: Diagnosis not present

## 2019-01-29 DIAGNOSIS — I10 Essential (primary) hypertension: Secondary | ICD-10-CM | POA: Diagnosis not present

## 2019-01-29 DIAGNOSIS — Z79899 Other long term (current) drug therapy: Secondary | ICD-10-CM | POA: Diagnosis not present

## 2019-01-29 DIAGNOSIS — M2550 Pain in unspecified joint: Secondary | ICD-10-CM | POA: Diagnosis not present

## 2019-02-05 ENCOUNTER — Ambulatory Visit: Payer: Medicare Other | Admitting: Nurse Practitioner

## 2019-02-14 DIAGNOSIS — J449 Chronic obstructive pulmonary disease, unspecified: Secondary | ICD-10-CM | POA: Diagnosis not present

## 2019-02-25 ENCOUNTER — Telehealth: Payer: Self-pay | Admitting: *Deleted

## 2019-02-25 MED ORDER — CEPHALEXIN 500 MG PO CAPS: 500.0000 mg | ORAL_CAPSULE | Freq: Three times a day (TID) | ORAL | 0 refills | Status: DC

## 2019-02-25 NOTE — Telephone Encounter (Signed)
Will rx keflex

## 2019-02-25 NOTE — Telephone Encounter (Signed)
Patient called stating she feels like she needs to "pee" and when she goes it is only a little dribble and the color of pickle juice. Patient is very concerned. Please call patient [phone]  Patient scheduled an appointment for 03/07/19

## 2019-02-25 NOTE — Telephone Encounter (Signed)
Pt states that she feels that she has a UTI. She is having frequency and dark urine. 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 She requests that a prescription be sent to her pharmacy. Advised that I would send her request to a provider and she could check with the pharmacy later today. Pt verbalized understanding.

## 2019-02-26 DIAGNOSIS — R51 Headache: Secondary | ICD-10-CM | POA: Diagnosis not present

## 2019-02-26 DIAGNOSIS — J449 Chronic obstructive pulmonary disease, unspecified: Secondary | ICD-10-CM | POA: Diagnosis not present

## 2019-02-26 DIAGNOSIS — Z9981 Dependence on supplemental oxygen: Secondary | ICD-10-CM | POA: Diagnosis not present

## 2019-02-26 DIAGNOSIS — I10 Essential (primary) hypertension: Secondary | ICD-10-CM | POA: Diagnosis not present

## 2019-03-07 ENCOUNTER — Ambulatory Visit: Payer: Medicare Other | Admitting: Adult Health

## 2019-03-15 DIAGNOSIS — J449 Chronic obstructive pulmonary disease, unspecified: Secondary | ICD-10-CM | POA: Diagnosis not present

## 2019-03-15 DIAGNOSIS — J9611 Chronic respiratory failure with hypoxia: Secondary | ICD-10-CM | POA: Diagnosis not present

## 2019-03-26 DIAGNOSIS — J449 Chronic obstructive pulmonary disease, unspecified: Secondary | ICD-10-CM | POA: Diagnosis not present

## 2019-03-26 DIAGNOSIS — M2550 Pain in unspecified joint: Secondary | ICD-10-CM | POA: Diagnosis not present

## 2019-03-26 DIAGNOSIS — R0602 Shortness of breath: Secondary | ICD-10-CM | POA: Diagnosis not present

## 2019-03-26 DIAGNOSIS — R5383 Other fatigue: Secondary | ICD-10-CM | POA: Diagnosis not present

## 2019-04-10 ENCOUNTER — Other Ambulatory Visit: Payer: Self-pay | Admitting: Adult Health

## 2019-04-15 DIAGNOSIS — J9611 Chronic respiratory failure with hypoxia: Secondary | ICD-10-CM | POA: Diagnosis not present

## 2019-04-15 DIAGNOSIS — J449 Chronic obstructive pulmonary disease, unspecified: Secondary | ICD-10-CM | POA: Diagnosis not present

## 2019-04-22 DIAGNOSIS — Z0001 Encounter for general adult medical examination with abnormal findings: Secondary | ICD-10-CM | POA: Diagnosis not present

## 2019-04-22 DIAGNOSIS — E7800 Pure hypercholesterolemia, unspecified: Secondary | ICD-10-CM | POA: Diagnosis not present

## 2019-04-22 DIAGNOSIS — E876 Hypokalemia: Secondary | ICD-10-CM | POA: Diagnosis not present

## 2019-04-22 DIAGNOSIS — J449 Chronic obstructive pulmonary disease, unspecified: Secondary | ICD-10-CM | POA: Diagnosis not present

## 2019-04-23 DIAGNOSIS — J449 Chronic obstructive pulmonary disease, unspecified: Secondary | ICD-10-CM | POA: Diagnosis not present

## 2019-04-23 DIAGNOSIS — J9611 Chronic respiratory failure with hypoxia: Secondary | ICD-10-CM | POA: Diagnosis not present

## 2019-04-23 DIAGNOSIS — I509 Heart failure, unspecified: Secondary | ICD-10-CM | POA: Diagnosis not present

## 2019-05-15 DIAGNOSIS — J449 Chronic obstructive pulmonary disease, unspecified: Secondary | ICD-10-CM | POA: Diagnosis not present

## 2019-05-15 DIAGNOSIS — J9611 Chronic respiratory failure with hypoxia: Secondary | ICD-10-CM | POA: Diagnosis not present

## 2019-05-17 DIAGNOSIS — J9611 Chronic respiratory failure with hypoxia: Secondary | ICD-10-CM | POA: Diagnosis not present

## 2019-05-17 DIAGNOSIS — I509 Heart failure, unspecified: Secondary | ICD-10-CM | POA: Diagnosis not present

## 2019-05-17 DIAGNOSIS — J441 Chronic obstructive pulmonary disease with (acute) exacerbation: Secondary | ICD-10-CM | POA: Diagnosis not present

## 2019-05-21 DIAGNOSIS — J449 Chronic obstructive pulmonary disease, unspecified: Secondary | ICD-10-CM | POA: Diagnosis not present

## 2019-05-21 DIAGNOSIS — M2550 Pain in unspecified joint: Secondary | ICD-10-CM | POA: Diagnosis not present

## 2019-05-21 DIAGNOSIS — I10 Essential (primary) hypertension: Secondary | ICD-10-CM | POA: Diagnosis not present

## 2019-05-21 DIAGNOSIS — E7800 Pure hypercholesterolemia, unspecified: Secondary | ICD-10-CM | POA: Diagnosis not present

## 2019-05-21 DIAGNOSIS — Z0001 Encounter for general adult medical examination with abnormal findings: Secondary | ICD-10-CM | POA: Diagnosis not present

## 2019-05-30 ENCOUNTER — Other Ambulatory Visit (HOSPITAL_COMMUNITY): Payer: Self-pay | Admitting: Pulmonary Disease

## 2019-05-30 DIAGNOSIS — Z1231 Encounter for screening mammogram for malignant neoplasm of breast: Secondary | ICD-10-CM

## 2019-05-31 DIAGNOSIS — J449 Chronic obstructive pulmonary disease, unspecified: Secondary | ICD-10-CM | POA: Diagnosis not present

## 2019-05-31 DIAGNOSIS — J9611 Chronic respiratory failure with hypoxia: Secondary | ICD-10-CM | POA: Diagnosis not present

## 2019-05-31 DIAGNOSIS — I10 Essential (primary) hypertension: Secondary | ICD-10-CM | POA: Diagnosis not present

## 2019-06-13 ENCOUNTER — Other Ambulatory Visit: Payer: Medicare Other | Admitting: Adult Health

## 2019-06-15 DIAGNOSIS — J449 Chronic obstructive pulmonary disease, unspecified: Secondary | ICD-10-CM | POA: Diagnosis not present

## 2019-06-15 DIAGNOSIS — J9611 Chronic respiratory failure with hypoxia: Secondary | ICD-10-CM | POA: Diagnosis not present

## 2019-06-18 DIAGNOSIS — Z79899 Other long term (current) drug therapy: Secondary | ICD-10-CM | POA: Diagnosis not present

## 2019-06-18 DIAGNOSIS — M2550 Pain in unspecified joint: Secondary | ICD-10-CM | POA: Diagnosis not present

## 2019-06-18 DIAGNOSIS — J449 Chronic obstructive pulmonary disease, unspecified: Secondary | ICD-10-CM | POA: Diagnosis not present

## 2019-06-18 DIAGNOSIS — I10 Essential (primary) hypertension: Secondary | ICD-10-CM | POA: Diagnosis not present

## 2019-06-21 DIAGNOSIS — J9611 Chronic respiratory failure with hypoxia: Secondary | ICD-10-CM | POA: Diagnosis not present

## 2019-06-21 DIAGNOSIS — I509 Heart failure, unspecified: Secondary | ICD-10-CM | POA: Diagnosis not present

## 2019-06-21 DIAGNOSIS — J449 Chronic obstructive pulmonary disease, unspecified: Secondary | ICD-10-CM | POA: Diagnosis not present

## 2019-07-15 DIAGNOSIS — J9611 Chronic respiratory failure with hypoxia: Secondary | ICD-10-CM | POA: Diagnosis not present

## 2019-07-15 DIAGNOSIS — J449 Chronic obstructive pulmonary disease, unspecified: Secondary | ICD-10-CM | POA: Diagnosis not present

## 2019-07-17 DIAGNOSIS — Z79899 Other long term (current) drug therapy: Secondary | ICD-10-CM | POA: Diagnosis not present

## 2019-07-17 DIAGNOSIS — R1013 Epigastric pain: Secondary | ICD-10-CM | POA: Diagnosis not present

## 2019-07-17 DIAGNOSIS — R5383 Other fatigue: Secondary | ICD-10-CM | POA: Diagnosis not present

## 2019-07-17 DIAGNOSIS — I10 Essential (primary) hypertension: Secondary | ICD-10-CM | POA: Diagnosis not present

## 2019-07-24 DIAGNOSIS — J449 Chronic obstructive pulmonary disease, unspecified: Secondary | ICD-10-CM | POA: Diagnosis not present

## 2019-07-24 DIAGNOSIS — J301 Allergic rhinitis due to pollen: Secondary | ICD-10-CM | POA: Diagnosis not present

## 2019-07-24 DIAGNOSIS — J9611 Chronic respiratory failure with hypoxia: Secondary | ICD-10-CM | POA: Diagnosis not present

## 2019-07-25 ENCOUNTER — Other Ambulatory Visit: Payer: Medicare Other | Admitting: Adult Health

## 2019-08-02 DIAGNOSIS — I10 Essential (primary) hypertension: Secondary | ICD-10-CM | POA: Diagnosis not present

## 2019-08-02 DIAGNOSIS — J9611 Chronic respiratory failure with hypoxia: Secondary | ICD-10-CM | POA: Diagnosis not present

## 2019-08-02 DIAGNOSIS — L409 Psoriasis, unspecified: Secondary | ICD-10-CM | POA: Diagnosis not present

## 2019-08-15 DIAGNOSIS — R5383 Other fatigue: Secondary | ICD-10-CM | POA: Diagnosis not present

## 2019-08-15 DIAGNOSIS — J9611 Chronic respiratory failure with hypoxia: Secondary | ICD-10-CM | POA: Diagnosis not present

## 2019-08-15 DIAGNOSIS — R0602 Shortness of breath: Secondary | ICD-10-CM | POA: Diagnosis not present

## 2019-08-15 DIAGNOSIS — Z79899 Other long term (current) drug therapy: Secondary | ICD-10-CM | POA: Diagnosis not present

## 2019-08-15 DIAGNOSIS — I10 Essential (primary) hypertension: Secondary | ICD-10-CM | POA: Diagnosis not present

## 2019-08-15 DIAGNOSIS — J449 Chronic obstructive pulmonary disease, unspecified: Secondary | ICD-10-CM | POA: Diagnosis not present

## 2019-09-03 DIAGNOSIS — J449 Chronic obstructive pulmonary disease, unspecified: Secondary | ICD-10-CM | POA: Diagnosis not present

## 2019-09-03 DIAGNOSIS — J9611 Chronic respiratory failure with hypoxia: Secondary | ICD-10-CM | POA: Diagnosis not present

## 2019-09-12 DIAGNOSIS — R5383 Other fatigue: Secondary | ICD-10-CM | POA: Diagnosis not present

## 2019-09-12 DIAGNOSIS — J449 Chronic obstructive pulmonary disease, unspecified: Secondary | ICD-10-CM | POA: Diagnosis not present

## 2019-09-12 DIAGNOSIS — Z79899 Other long term (current) drug therapy: Secondary | ICD-10-CM | POA: Diagnosis not present

## 2019-09-12 DIAGNOSIS — L309 Dermatitis, unspecified: Secondary | ICD-10-CM | POA: Diagnosis not present

## 2019-09-15 DIAGNOSIS — J9611 Chronic respiratory failure with hypoxia: Secondary | ICD-10-CM | POA: Diagnosis not present

## 2019-09-15 DIAGNOSIS — J449 Chronic obstructive pulmonary disease, unspecified: Secondary | ICD-10-CM | POA: Diagnosis not present

## 2019-10-09 ENCOUNTER — Other Ambulatory Visit: Payer: Medicare Other | Admitting: Adult Health

## 2019-10-10 DIAGNOSIS — E7800 Pure hypercholesterolemia, unspecified: Secondary | ICD-10-CM | POA: Diagnosis not present

## 2019-10-10 DIAGNOSIS — I10 Essential (primary) hypertension: Secondary | ICD-10-CM | POA: Diagnosis not present

## 2019-10-10 DIAGNOSIS — Z79899 Other long term (current) drug therapy: Secondary | ICD-10-CM | POA: Diagnosis not present

## 2019-10-15 DIAGNOSIS — J9611 Chronic respiratory failure with hypoxia: Secondary | ICD-10-CM | POA: Diagnosis not present

## 2019-10-15 DIAGNOSIS — J449 Chronic obstructive pulmonary disease, unspecified: Secondary | ICD-10-CM | POA: Diagnosis not present

## 2019-11-20 ENCOUNTER — Telehealth: Payer: Self-pay

## 2019-11-20 NOTE — Telephone Encounter (Signed)
VM received. Pt wants a return call.  Returned call at 3:18 PM. Pt states she was having constitpation and took a bottle of Magnesium Citrate around 11:00 AM and hasn't had a bowel movement. Pt states she has drunken looks of fluids within the last hour. Pt is asking for advice for her bowels. Tried to schedule an appointment to get better control of constipation. Pt declined scheduling and wants a call back. Pt is aware that our office is closing today and will not open up until 11/25/2019. Pt hasn't been seen in 1 year.

## 2019-11-20 NOTE — Telephone Encounter (Signed)
Spoke with patient. Not on Linzess (can't afford it). Gave instructions for MiraLAX purge. If worsening symptoms or new onset significant abdominal pain, N/V she can call on call, go to UC or ER. Verbalized understanding  Please call and check on her when we open on Monday.

## 2019-11-25 NOTE — Telephone Encounter (Signed)
FYI Spoke with pt 11/25/2019 @ 9:55 AM, pt states that she is doing better and has stool coming out. Pt hasn't picked up the Miralax yet, since she doesn't get out much. Pt does plan on picking Miralax up.

## 2019-11-26 NOTE — Telephone Encounter (Signed)
Noted, no further recommendations at this time. 

## 2019-11-26 NOTE — Telephone Encounter (Signed)
Noted  

## 2019-12-13 ENCOUNTER — Telehealth: Payer: Self-pay | Admitting: Gastroenterology

## 2019-12-13 NOTE — Telephone Encounter (Signed)
Called patient TO DISCUSS CONCERNS. NO VOICE MAILBOX. WILL TRY AGAIN.

## 2019-12-25 ENCOUNTER — Other Ambulatory Visit: Payer: Medicare Other | Admitting: Adult Health

## 2020-02-07 ENCOUNTER — Telehealth: Payer: Self-pay | Admitting: Adult Health

## 2020-02-07 NOTE — Telephone Encounter (Signed)

## 2020-02-10 ENCOUNTER — Other Ambulatory Visit: Payer: Medicare Other | Admitting: Adult Health

## 2020-03-23 ENCOUNTER — Telehealth: Payer: Self-pay | Admitting: Internal Medicine

## 2020-03-23 NOTE — Telephone Encounter (Signed)
[phone] PATIENT CALLED STATING THAT SHE NEEDED HELP, SHE HAS NOT GONE TO THE BATHROOM IN 3 DAYS AND IS IN A PANIC

## 2020-03-23 NOTE — Telephone Encounter (Signed)
FYI Spoke with pt. She is going to start the Miralax that was previously recommended by EG. Pt scheduled an apt with EG on 03/26/20 @ 3:30 since it's been a while since she has been seen. Pt reports no abdominal pain, no n/v, no lightheadedness. Pt is aware that if her symptoms worsen, she is to go to the ED.

## 2020-03-26 ENCOUNTER — Ambulatory Visit: Payer: Medicare Other | Admitting: Nurse Practitioner

## 2020-03-26 ENCOUNTER — Other Ambulatory Visit: Payer: Self-pay

## 2020-03-26 ENCOUNTER — Encounter: Payer: Self-pay | Admitting: Nurse Practitioner

## 2020-03-26 VITALS — BP 156/78 | HR 73 | Temp 97.5°F | Ht 63.0 in | Wt 168.4 lb

## 2020-03-26 DIAGNOSIS — K5900 Constipation, unspecified: Secondary | ICD-10-CM | POA: Diagnosis not present

## 2020-03-26 NOTE — Progress Notes (Signed)
Referring Provider: Sinda Du, MD Primary Care Physician:  Asencion Noble, MD Primary GI:  Dr. Gala Romney  Chief Complaint  Patient presents with  . Constipation    started miralax BID Monday and had small BM this morning. Also drank cit mag monday. Linzess was $90     HPI:   [name] is a 69 y.o. female who presents for follow-up on constipation.  Patient was last seen in our office 11/09/2018 for constipation and hematochezia.  Noted previous colonoscopy in 2015.  At her last visit she noted she tried mag citrate which a lot of productive bowel movement.  Some worsening as of a couple weeks prior to her last visit.  Before this she typically had regular bowel movements consistent with Bristol 4.  On pain medications for 10+ years.  At her last visit she noted she was eating more fast food than normal and consuming less fiber as well as not using Benefiber as previously recommended.  With recent constipation her stools were Bristol 1-2 with straining and some scant toilet tissue hematochezia.  Also associated postprandial bloating.  Generalized abdominal discomfort that improved after mag citrate and bowel movement.  No other overt GI complaints.  Recommended Benefiber 1 teaspoon twice daily, samples of Linzess, progress report 1 week, follow-up in 3 months.  Noted Linzess 72 mcg working well and a prescription was sent to her pharmacy.  She called our office 11/20/2019 indicating worsening constipation and took mag citrate without a bowel movement.  Declined appointment at that time.  Noted that Linzess was not affordable for the patient.  She was given instructions on MiraLAX purge and ER precautions.  Follow-up indicated she was doing better and having a bowel movement.  She can call our office 03/23/2020 noting constipation with no bowel movement in 3 days.  She noted that she would start the MiraLAX as previously recommended and an appointment was scheduled.  Denied abdominal pain,  nausea or vomiting, lightheadedness.  ER precautions given.  Today she states she's doing ok overall. Recent episode of constipation that improved with MgCitrate. Linzess is too expensive. MiraLAX purge helped. Has an episode of scant toilet tissue hematochezia with known hemorrhoids. Due for TCS 2022. Denies overt abdominal pain. Was having nausea with constipation that improved after bowel movement. Denies fever, chills, unintentional weight loss. Denies URI or flu-like symptoms. Denies loss of sense of taste or smell. Has received both doses of COVID-19 vaccine. Dyspnea is at baseline. Denies chest pain, dizziness, lightheadedness, syncope, near syncope. Denies any other upper or lower GI symptoms.  No known kidney disease. Is on opioid pain medication.  Past Medical History:  Diagnosis Date  . Anxiety   . Atrophic vaginitis 10/09/2015  . Baker's cyst    left leg  . Blood in urine 10/09/2015  . CHF (congestive heart failure) ([place])   . COPD (chronic obstructive pulmonary disease) ([place])   . Hematuria 10/09/2015  . Hypertension   . Nicotine addiction 04/07/2014  . Pneumonia   . Urinary urgency 01/25/2016  . Vaginal bleeding 10/09/2015    Past Surgical History:  Procedure Laterality Date  . ABDOMINAL HYSTERECTOMY    . CATARACT EXTRACTION Left 03/2015  . CESAREAN SECTION    . COLONOSCOPY  10/19/2004   Dr. Gala Romney- internal hemorrhoids, o/w normal rectum, normal colon  . COLONOSCOPY N/A 07/30/2014   [id number] diverticulosis. Colonic polyp-removed TA. repeat TCS 07/2021  . ESOPHAGOGASTRODUODENOSCOPY N/A 07/30/2014   [id number] EGD  . RADIAL HEAD ARTHROPLASTY  Left 06/23/2017   Procedure: RADIAL HEAD ARTHROPLASTY WITH LIGAMENT REPAIR;  Surgeon: Meredith Pel, MD;  Location: [facility];  Service: Orthopedics;  Laterality: Left;    Current Outpatient Medications  Medication Sig Dispense Refill  . albuterol (PROVENTIL HFA;VENTOLIN HFA) 108 (90 BASE) MCG/ACT inhaler Inhale 2 puffs into the  lungs every 6 (six) hours as needed. For shortness of breath      . albuterol (PROVENTIL) (2.5 MG/3ML) 0.083% nebulizer solution Take 2.5 mg by nebulization every 6 (six) hours as needed for wheezing or shortness of breath.    Geronimo Boot XT 180 MG 24 hr capsule Take 1 tablet by mouth 2 (two) times daily.  4  . escitalopram (LEXAPRO) 5 MG tablet Take 5 mg by mouth as needed.    . Fluticasone-Umeclidin-Vilant (TRELEGY ELLIPTA IN) Inhale into the lungs daily.    . furosemide (LASIX) 40 MG tablet Take 40 mg by mouth daily as needed.     Marland Kitchen HYDROcodone-acetaminophen (NORCO) 10-325 MG tablet Take 1 tablet by mouth every 4 (four) hours as needed.    Marland Kitchen losartan-hydrochlorothiazide (HYZAAR) 100-25 MG tablet Take 1 tablet by mouth daily.    . mirtazapine (REMERON) 7.5 MG tablet Take 7.5 mg by mouth at bedtime as needed.    . Multiple Vitamin (MULTIVITAMIN WITH MINERALS) TABS Take 1 tablet by mouth daily.    . cephALEXin (KEFLEX) 500 MG capsule Take 1 capsule (500 mg total) by mouth 3 (three) times daily. (Patient not taking: Reported on 03/26/2020) 21 capsule 0  . linaclotide (LINZESS) 72 MCG capsule Take 1 capsule (72 mcg total) by mouth daily before breakfast. (Patient not taking: Reported on 03/26/2020) 30 capsule 5  . oxyCODONE-acetaminophen (PERCOCET) 5-325 MG tablet Take 1 tablet by mouth every 6 (six) hours as needed for severe pain. (Patient not taking: Reported on 03/26/2020) 6 tablet 0  . potassium chloride (KLOR-CON) 20 MEQ packet Take 20 mEq by mouth daily.     No current facility-administered medications for this visit.    Allergies as of 03/26/2020  . (No Known Allergies)    Family History  Adopted: Yes  Problem Relation Age of Onset  . Cancer Sister        pancreatic  . Migraines Daughter   . Cancer Daughter        pre cancerous cells on cervix  . Cancer Sister        breast cancer, colon  . Blindness Maternal Grandfather   . Other Brother        murdered  . Other Sister         ruptured colon  . Cancer Sister        breast, skin  . Other Brother        was in [place]  . Cancer Sister        pancreatic  . Hypertension Sister   . Other Sister        ruptured colon  . Colon cancer Neg Hx     Social History   Socioeconomic History  . Marital status: Single    Spouse name: Not on file  . Number of children: Not on file  . Years of education: Not on file  . Highest education level: Not on file  Occupational History  . Not on file  Tobacco Use  . Smoking status: Former Smoker    Years: 40.00    Types: Cigarettes    Quit date: 04/14/2013    Years since quitting: 6.9  .  Smokeless tobacco: Never Used  Substance and Sexual Activity  . Alcohol use: Yes    Comment: glass of wine maybe once a year  . Drug use: No  . Sexual activity: Never    Birth control/protection: Surgical    Comment: hyst  Other Topics Concern  . Not on file  Social History Narrative  . Not on file   Social Determinants of Health   Financial Resource Strain:   . Difficulty of Paying Living Expenses:   Food Insecurity:   . Worried About Charity fundraiser in the Last Year:   . Arboriculturist in the Last Year:   Transportation Needs:   . Film/video editor (Medical):   Marland Kitchen Lack of Transportation (Non-Medical):   Physical Activity:   . Days of Exercise per Week:   . Minutes of Exercise per Session:   Stress:   . Feeling of Stress :   Social Connections:   . Frequency of Communication with Friends and Family:   . Frequency of Social Gatherings with Friends and Family:   . Attends Religious Services:   . Active Member of Clubs or Organizations:   . Attends Archivist Meetings:   Marland Kitchen Marital Status:     Subjective: Review of Systems  Constitutional: Negative for chills, fever, malaise/fatigue and weight loss.  HENT: Negative for congestion and sore throat.   Respiratory: Negative for cough and shortness of breath.   Cardiovascular: Negative for chest pain and  palpitations.  Gastrointestinal: Positive for constipation and nausea. Negative for abdominal pain, blood in stool, diarrhea, melena and vomiting.  Musculoskeletal: Negative for joint pain and myalgias.  Skin: Negative for rash.  Neurological: Negative for dizziness and weakness.  Endo/Heme/Allergies: Does not bruise/bleed easily.  Psychiatric/Behavioral: Negative for depression. The patient is not nervous/anxious.   All other systems reviewed and are negative.    Objective: BP (!) 156/78   Pulse 73   Temp (!) 97.5 F (36.4 C) (Oral)   Ht 5\' 3"  (1.6 m)   Wt 168 lb 6.4 oz (76.4 kg)   BMI 29.83 kg/m  Physical Exam Vitals and nursing note reviewed.  Constitutional:      General: She is not in acute distress.    Appearance: Normal appearance. She is well-developed and normal weight. She is not ill-appearing, toxic-appearing or diaphoretic.  HENT:     Head: Normocephalic and atraumatic.  Cardiovascular:     Rate and Rhythm: Normal rate and regular rhythm.     Heart sounds: Normal heart sounds.  Pulmonary:     Effort: Pulmonary effort is normal. No tachypnea or respiratory distress.     Breath sounds: Decreased air movement present. Examination of the right-middle field reveals wheezing. Examination of the left-middle field reveals wheezing. Examination of the right-lower field reveals wheezing. Examination of the left-lower field reveals wheezing. Wheezing (mild bilateral) present.     Comments: Diminished bilaterally On O2 3 lpm per [place] Abdominal:     General: Bowel sounds are normal.     Palpations: Abdomen is soft. There is no hepatomegaly, splenomegaly or mass.     Tenderness: There is no abdominal tenderness. There is no guarding or rebound.     Hernia: No hernia is present.  Skin:    General: Skin is warm and dry.     Coloration: Skin is not jaundiced.     Findings: No rash.  Neurological:     General: No focal deficit present.     Mental  Status: She is alert and  oriented to person, place, and time.  Psychiatric:        Attention and Perception: Attention normal.        Mood and Affect: Mood normal.        Speech: Speech normal.        Behavior: Behavior normal.        Thought Content: Thought content normal.        Cognition and Memory: Cognition and memory normal.       03/26/2020 3:57 PM   Disclaimer: This note was dictated with voice recognition software. Similar sounding words can inadvertently be transcribed and may not be corrected upon review.

## 2020-03-26 NOTE — Assessment & Plan Note (Signed)
Worsening constipation.  The patient was previously on Linzess but this was too expensive for her.  She has been using mag citrate and MiraLAX as needed.  She does have cyclic constipation.  She is on chronic opioids.  At this point I will give her samples of Movantik to take once daily.  No history of kidney disease, most recent creatinine I can find is from mid to late 2018 and normal.  She again confirmed that she has never been told she has kidney disease or abnormal kidney labs.  I requested progress report 1 to 2 weeks.  It is effective for her we can send a prescription to her pharmacy to see if insurance will cover.  Further recommendations will follow her response to the medication and coverage.  Follow-up in 6 months.

## 2020-03-26 NOTE — Patient Instructions (Signed)
Your health issues we discussed today were:   Constipation: 1. I am giving you samples of Movantik 25 mg 2. Take 1 pill, once a day 3. Call us in 1 to 2 weeks and let us know if it is helping you have better bowel movements 4. Call us if you have any problems or side effects of the medication 5. Let us know if you have any worsening or severe constipation 6. If the medication works for you we can try to send a prescription to see if your insurance will cover it.  Overall I recommend:  1. Continue your other current medications 2. Return for follow-up in 6 months 3. Call if you have any questions or concerns.   At [facility] we value your feedback. You may receive a survey about your visit today. Please share your experience as we strive to create trusting relationships with our patients to provide genuine, compassionate, quality care.  We appreciate your understanding and patience as we review any laboratory studies, imaging, and other diagnostic tests that are ordered as we care for you. Our office policy is 5 business days for review of these results, and any emergent or urgent results are addressed in a timely manner for your best interest. If you do not hear from our office in 1 week, please contact us.   We also encourage the use of MyChart, which contains your medical information for your review as well. If you are not enrolled in this feature, an access code is on this after visit summary for your convenience. Thank you for allowing [place] to be involved in your care.  It was great to see you today!  I hope you have a great day!!

## 2020-03-30 ENCOUNTER — Telehealth: Payer: Self-pay | Admitting: Adult Health

## 2020-03-30 ENCOUNTER — Encounter: Payer: Self-pay | Admitting: Internal Medicine

## 2020-03-30 NOTE — Telephone Encounter (Signed)

## 2020-03-31 ENCOUNTER — Other Ambulatory Visit: Payer: Medicare Other | Admitting: Adult Health

## 2020-03-31 NOTE — Telephone Encounter (Signed)
Noted, see OV notes for further details

## 2020-04-02 ENCOUNTER — Telehealth: Payer: Self-pay | Admitting: Emergency Medicine

## 2020-04-02 DIAGNOSIS — K5903 Drug induced constipation: Secondary | ICD-10-CM

## 2020-04-02 MED ORDER — NALOXEGOL OXALATE 25 MG PO TABS: 25.0000 mg | ORAL_TABLET | Freq: Every day | ORAL | 5 refills | Status: DC

## 2020-04-02 NOTE — Telephone Encounter (Signed)
Called pt she stated she would not take them together because the movantik is helping

## 2020-04-02 NOTE — Telephone Encounter (Signed)
Noted. She doesn't need to take Movantik WITH the pain medication. Just don't keep taking Movantik if she stops taking pain medication (it can cause diarrhea at that point).  Rx sent to pharmacy per request. I'm glad it is helping!

## 2020-04-02 NOTE — Addendum Note (Signed)
Addended by: Gordy Levan, Johnell Landowski A on: 04/02/2020 02:39 PM   Modules accepted: Orders

## 2020-04-02 NOTE — Telephone Encounter (Signed)
Pt called in and stated she has been taking movantik 25mg  once daily and it is working well for her. Will you send in a rx for [facility] to [facility] [address] in [place] phone number [phone] only taking pain medication prn and she is not taking the pain medication with the [facility]

## 2020-04-06 ENCOUNTER — Other Ambulatory Visit: Payer: Self-pay | Admitting: Internal Medicine

## 2020-04-06 MED ORDER — LUBIPROSTONE 24 MCG PO CAPS: 24.0000 ug | ORAL_CAPSULE | Freq: Two times a day (BID) | ORAL | 3 refills | Status: DC

## 2020-04-06 NOTE — Telephone Encounter (Signed)
I have sent in Amitiza 24 mcg po BID. Further recommendations per Randall Hiss tomorrow.

## 2020-04-06 NOTE — Telephone Encounter (Signed)
Pt called in and said that she is completely out of medicine.  Informed her that EG was not here today.  She would like to see if we can do something to help her today since she is out of medicine.  [phone]

## 2020-04-06 NOTE — Telephone Encounter (Signed)
PA for movantik was completed and denied. Pt has to try and fail 2 of the following medications: Amitiza, lactulose or relistor. Or Randall Hiss can do an Astronomer.

## 2020-04-06 NOTE — Telephone Encounter (Signed)
Left a detailed message for pt

## 2020-04-06 NOTE — Telephone Encounter (Signed)
Noted. Will wait for Eg's response.

## 2020-04-06 NOTE — Telephone Encounter (Signed)
Pt walked in office today at 3:55 PM. Pt asked for a Movantik sample since she couldn't go to the pharmacy today. Pt said she couldn't wait for me to get the sample and stated that was ok and left the office.

## 2020-04-06 NOTE — Telephone Encounter (Signed)
Routing to [name]

## 2020-04-06 NOTE — Addendum Note (Signed)
Addended by: Annitta Needs on: 04/06/2020 11:36 AM   Modules accepted: Orders

## 2020-04-06 NOTE — Telephone Encounter (Signed)
Pt called saying she had Movantik samples and she was out. She said we needed to call her insurance company and she uses [facility] [address] in [place]. I asked her was it for a PA and she didn't know. Please advise. [phone]

## 2020-04-07 ENCOUNTER — Telehealth: Payer: Self-pay | Admitting: Internal Medicine

## 2020-04-07 NOTE — Telephone Encounter (Signed)
Pt called to say that the prescription called [facility] [address] was something different and cost $47. She is asking for Movantik and has been out for 3 days. SHe came to office yesterday for samples, but could not wait for AM to get them for her and left. Pt said her O2 tank was about empty and had to leave. Please advise. [phone]

## 2020-04-07 NOTE — Telephone Encounter (Signed)
Noted. Appears Amitiza 24 mcg bid was sent to the pharmacy.

## 2020-04-08 NOTE — Telephone Encounter (Signed)
Spoke with pt. Pt picked up the RX AB sent in on Monday 04/06/20. Pt is aware that it's the generic for Monvantik. Pt took one pill yesterday and will take another pill this morning. Pt states she hasn't had a BM in 4 days and she has some anxiety. Pt is afraid to eat since she hasn't had a BM. Pt reports no pain and after talking to pt, pt is going to eat and drink fluids. Pt is aware that eating and drinking will help her have a BM. Pt thanked me for walking her through this since she is very nervous. Pt would like to ask if she she should take Magnesium Citrate since she hasn't had a BM yet? Please advise. Pt also discussed the price of Movantik being $40. Pt may not be able to afford this medication and may have to take otc meds to help with constipation.

## 2020-04-08 NOTE — Telephone Encounter (Signed)
Correction EG. AB sent in [place]. EG previously sent in [place]. See note below, pt wont be able to afford $40 a month for medication.

## 2020-04-09 NOTE — Telephone Encounter (Signed)
Agree she needs to eat and drink fluids. She has a month of Amitiza, let's try it and see if it helps (along with the food/fluids). We can investigate patient assistance if it does work. If not, can try other options (Symproic vs Relistor initially for OIC).  Let me know if any other issues.

## 2020-04-09 NOTE — Telephone Encounter (Signed)
Spoke with pt. Pt is aware of EG recommendations. Pt will take Amitiza as directed, increase fluids and eat regular meals. Pt will call back if this isn't helping her have a BM. Will discuss further medications with pt if needed.

## 2020-05-04 ENCOUNTER — Telehealth: Payer: Self-pay | Admitting: *Deleted

## 2020-05-04 NOTE — Telephone Encounter (Signed)
Spoke with pt. Pt is states she is scared she was having a blockage. Pt reports no abdominal pain, pt is able to pass gas, pt had a small BM yesterday. Pt is taking the generic Amitiza. Pt thinks the brand medication worked better for her. Pt has some miralax at home and is aware that it's ok to take a dose with 8 oz of water. Pt states she is nervous to eat and pt is aware that she doesn't need to stop eating and drinking fluids. It was mentioned that we could try Symproic or Relistor. Pt wants to possibly try something else but doesn't want the cost to be expensive. Pt is aware that we wont know the cost of medications unless it's sent to her pharmacy.

## 2020-05-04 NOTE — Telephone Encounter (Signed)
Pt called in and said that Lubiprostone is not working at all.  She says she takes it 2 times a day. She says that it is too expensive.   She wants to know if she can get some medicine that works better.  [facility] [address] in [place]. She said that she is afraid to eat because she is not going to the bathroom.  She said that she is scared that she has a blockage.  She said that her bm's have consistency of pudding and are the size of a tablespoon.  Pt said that she does have occasional bleeding when straining to have a bm.  Pt says she is due for a colonoscopy in a year but wonders if she should have it done sooner. [phone]

## 2020-05-05 ENCOUNTER — Telehealth: Payer: Self-pay | Admitting: Internal Medicine

## 2020-05-05 NOTE — Telephone Encounter (Signed)
Patient called to let you know that after she got off the phone with you yesterday that her medication started working and worked this morning also

## 2020-05-05 NOTE — Telephone Encounter (Signed)
Noted. FYI EG.

## 2020-05-05 NOTE — Telephone Encounter (Signed)
Good to hear! No further recommendations at this time.

## 2020-05-05 NOTE — Telephone Encounter (Signed)
Noted  

## 2020-05-05 NOTE — Telephone Encounter (Signed)
See other phone note with resolution of symptoms.

## 2020-05-12 ENCOUNTER — Other Ambulatory Visit: Payer: Medicare Other | Admitting: Adult Health

## 2020-05-14 ENCOUNTER — Other Ambulatory Visit: Payer: Medicare Other | Admitting: Adult Health

## 2020-06-02 ENCOUNTER — Telehealth: Payer: Self-pay | Admitting: Internal Medicine

## 2020-06-02 NOTE — Telephone Encounter (Signed)
Pt said she needed something cheaper because her Lubiprostgone 24 mcg capsules were too expensive. Please advise [phone]

## 2020-06-02 NOTE — Telephone Encounter (Signed)
Spoke with pt. Pt paid $46 for Amitiza 24 mcg one month and that price was expensive for pt. This month, the medication was $74.00. pt would like for our office to send in something else. Pt is aware that the medications was covered by insurance and not price effective for pt. Pt was asked if she has tried Miralax bid and pt hasn't tried doing Miralax.

## 2020-06-04 NOTE — Telephone Encounter (Signed)
Has tried and failed (or can't afford): Linzess, movantik, Amitiza.  Options that are left: Trulance, Relistor, Symproic versus OTC (such as MiraLAX)  For now let's have her try MiraLAX 1 capful 1-2 times a day. Call with progress report in 1-2 weeks

## 2020-06-04 NOTE — Telephone Encounter (Signed)
Spoke with pt. Pt is going to try Miralax 1-2 times daily and call back with a Progress report.

## 2020-06-17 ENCOUNTER — Telehealth: Payer: Self-pay | Admitting: Adult Health

## 2020-06-17 NOTE — Telephone Encounter (Signed)
Called patient regarding appointment and the following message was left:   Updated visitor policy: We are now allowing one support person with you during your upcoming visit.  However, we do ask that they wear a mask and will also be screened at check-in.   We ask if you are sick, have any symptoms of COVID, have had any exposure to anyone suspected or confirmed of having COVID-19, or are awaiting test results for COVID-19, to call our office as we may need to reschedule you for a virtual visit or schedule your appointment for a later date.    Please know we will ask you these questions or similar questions when you arrive for your appointment and understand this is how we are keeping everyone safe.    Also,to keep you safe, please use the provided hand sanitizer when you enter the office. We are asking everyone in the office to wear a mask to help prevent the spread of germs. If you have a mask of your own, please wear it to your appointment, if not, we are happy to provide one for you.  Thank you for understanding and your cooperation.    [facility] Staff

## 2020-06-18 ENCOUNTER — Other Ambulatory Visit: Payer: Medicare Other | Admitting: Adult Health

## 2020-06-18 ENCOUNTER — Telehealth: Payer: Self-pay | Admitting: Adult Health

## 2020-06-18 NOTE — Telephone Encounter (Signed)
Patient would like medication sent for uti symptoms. Patient was on the schedule for pap/phys today at 1150 patient canceled due to family emergency. [facility] [address].

## 2020-06-19 ENCOUNTER — Other Ambulatory Visit (INDEPENDENT_AMBULATORY_CARE_PROVIDER_SITE_OTHER): Payer: Medicare Other | Admitting: *Deleted

## 2020-06-19 DIAGNOSIS — R109 Unspecified abdominal pain: Secondary | ICD-10-CM

## 2020-06-19 DIAGNOSIS — R300 Dysuria: Secondary | ICD-10-CM

## 2020-06-19 LAB — POCT URINALYSIS DIPSTICK
Blood, UA: NEGATIVE
Glucose, UA: NEGATIVE
Ketones, UA: NEGATIVE
Leukocytes, UA: NEGATIVE
Nitrite, UA: NEGATIVE
Protein, UA: POSITIVE — AB

## 2020-06-19 MED ORDER — SULFAMETHOXAZOLE-TRIMETHOPRIM 800-160 MG PO TABS: 1.0000 | ORAL_TABLET | Freq: Two times a day (BID) | ORAL | 0 refills | Status: DC

## 2020-06-19 NOTE — Progress Notes (Signed)
Meds ordered this encounter  Medications   sulfamethoxazole-trimethoprim (BACTRIM DS) 800-160 MG tablet    Sig: Take 1 tablet by mouth 2 (two) times daily.    Dispense:  14 tablet    Refill:  0    

## 2020-06-19 NOTE — Progress Notes (Signed)
   NURSE VISIT- UTI SYMPTOMS   SUBJECTIVE:  [name] is a 69 y.o. G1P1 female here for UTI symptoms. She is a GYN patient. She reports dysuria and flank pain on the right.  OBJECTIVE:  There were no vitals taken for this visit.  Appears well, in no apparent distress Patient made multiple attempts to void however, she was unsuccessful. Dr Elonda Husky requested patient be I&O cathed in order to dip urine and send for culture.  Pt agreeable to plan along with her daughter at bedside. Tolerated procedure well.   Results for orders placed or performed in visit on 06/19/20 (from the past 24 hour(s))  POCT Urinalysis Dipstick   Collection Time: 06/19/20  1:03 PM  Result Value Ref Range   Color, UA     Clarity, UA     Glucose, UA Negative Negative   Bilirubin, UA     Ketones, UA neg    Spec Grav, UA     Blood, UA neg    pH, UA     Protein, UA Positive (A) Negative   Urobilinogen, UA     Nitrite, UA neg    Leukocytes, UA Negative Negative   Appearance     Odor      ASSESSMENT: GYN patient with UTI symptoms and negative nitrites  PLAN: Discussed with Dr. Elonda Husky   Rx sent by provider today: Yes Urine culture sent Call or return to clinic prn if these symptoms worsen or fail to improve as anticipated. Follow-up: as needed   [name]  06/19/2020 1:05 PM

## 2020-06-19 NOTE — Addendum Note (Signed)
Addended by: Octaviano Glow on: 06/19/2020 01:09 PM   Modules accepted: Orders

## 2020-06-21 LAB — URINE CULTURE: Organism ID, Bacteria: NO GROWTH

## 2020-06-22 ENCOUNTER — Telehealth: Payer: Self-pay | Admitting: Adult Health

## 2020-06-22 NOTE — Telephone Encounter (Signed)
Patient will like to get a phone call about the medication she is taking.

## 2020-06-22 NOTE — Telephone Encounter (Signed)
Pt requested that you give her a call regarding her catheter that was done last week.

## 2020-06-24 ENCOUNTER — Emergency Department (HOSPITAL_COMMUNITY)
Admission: EM | Admit: 2020-06-24 | Discharge: 2020-06-25 | Disposition: A | Discharge status: Home / Self Care | Payer: Medicare Other | Attending: Emergency Medicine | Admitting: Emergency Medicine

## 2020-06-24 ENCOUNTER — Emergency Department (HOSPITAL_COMMUNITY): Payer: Medicare Other

## 2020-06-24 ENCOUNTER — Other Ambulatory Visit: Payer: Self-pay

## 2020-06-24 ENCOUNTER — Encounter (HOSPITAL_COMMUNITY): Payer: Self-pay | Admitting: Pediatrics

## 2020-06-24 DIAGNOSIS — R0789 Other chest pain: Secondary | ICD-10-CM | POA: Insufficient documentation

## 2020-06-24 DIAGNOSIS — Z79899 Other long term (current) drug therapy: Secondary | ICD-10-CM | POA: Insufficient documentation

## 2020-06-24 DIAGNOSIS — I110 Hypertensive heart disease with heart failure: Secondary | ICD-10-CM | POA: Insufficient documentation

## 2020-06-24 DIAGNOSIS — Z87891 Personal history of nicotine dependence: Secondary | ICD-10-CM | POA: Diagnosis not present

## 2020-06-24 DIAGNOSIS — J449 Chronic obstructive pulmonary disease, unspecified: Secondary | ICD-10-CM | POA: Diagnosis not present

## 2020-06-24 DIAGNOSIS — I509 Heart failure, unspecified: Secondary | ICD-10-CM | POA: Insufficient documentation

## 2020-06-24 DIAGNOSIS — R339 Retention of urine, unspecified: Secondary | ICD-10-CM | POA: Insufficient documentation

## 2020-06-24 DIAGNOSIS — Z96622 Presence of left artificial elbow joint: Secondary | ICD-10-CM | POA: Diagnosis not present

## 2020-06-24 DIAGNOSIS — R1031 Right lower quadrant pain: Secondary | ICD-10-CM | POA: Diagnosis not present

## 2020-06-24 DIAGNOSIS — Z7982 Long term (current) use of aspirin: Secondary | ICD-10-CM | POA: Insufficient documentation

## 2020-06-24 DIAGNOSIS — R1032 Left lower quadrant pain: Secondary | ICD-10-CM | POA: Insufficient documentation

## 2020-06-24 LAB — URINALYSIS, ROUTINE W REFLEX MICROSCOPIC
Bilirubin Urine: NEGATIVE
Glucose, UA: NEGATIVE mg/dL
Hgb urine dipstick: NEGATIVE
Ketones, ur: NEGATIVE mg/dL
Leukocytes,Ua: NEGATIVE
Nitrite: NEGATIVE
Protein, ur: NEGATIVE mg/dL
Specific Gravity, Urine: 1.012 (ref 1.005–1.030)
pH: 6 (ref 5.0–8.0)

## 2020-06-24 LAB — CBC
HCT: 39.2 % (ref 36.0–46.0)
Hemoglobin: 11.9 g/dL — ABNORMAL LOW (ref 12.0–15.0)
MCH: 30.1 pg (ref 26.0–34.0)
MCHC: 30.4 g/dL (ref 30.0–36.0)
MCV: 99 fL (ref 80.0–100.0)
Platelets: 214 10*3/uL (ref 150–400)
RBC: 3.96 MIL/uL (ref 3.87–5.11)
RDW: 11.8 % (ref 11.5–15.5)
WBC: 12.9 10*3/uL — ABNORMAL HIGH (ref 4.0–10.5)
nRBC: 0 % (ref 0.0–0.2)

## 2020-06-24 LAB — TROPONIN I (HIGH SENSITIVITY): Troponin I (High Sensitivity): 14 ng/L (ref <18)

## 2020-06-24 LAB — BASIC METABOLIC PANEL
Anion gap: 12 (ref 5–15)
BUN: 12 mg/dL (ref 8–23)
CO2: 36 mmol/L — ABNORMAL HIGH (ref 22–32)
Calcium: 9 mg/dL (ref 8.9–10.3)
Chloride: 82 mmol/L — ABNORMAL LOW (ref 98–111)
Creatinine, Ser: 0.79 mg/dL (ref 0.44–1.00)
GFR calc Af Amer: >60 mL/min (ref >60)
GFR calc non Af Amer: >60 mL/min (ref >60)
Glucose, Bld: 112 mg/dL — ABNORMAL HIGH (ref 70–99)
Potassium: 4.1 mmol/L (ref 3.5–5.1)
Sodium: 130 mmol/L — ABNORMAL LOW (ref 135–145)

## 2020-06-24 MED ORDER — SODIUM CHLORIDE 0.9% FLUSH
3.0000 mL | Freq: Once | INTRAVENOUS | Status: AC
  Administered 2020-06-25: 3 mL via INTRAVENOUS

## 2020-06-24 NOTE — ED Triage Notes (Signed)
Reported has ongoing RX for UTI, and ABX was changed to macrobid on Monday, now has urinary retention and bilateral flank pain. EMS endorsed en route patient c/o chest pain as well w/ hx of CHF.

## 2020-06-24 NOTE — ED Triage Notes (Signed)
Pt stated the last time she urinated was at 6 this morning.

## 2020-06-25 ENCOUNTER — Emergency Department (HOSPITAL_COMMUNITY): Payer: Medicare Other

## 2020-06-25 LAB — TROPONIN I (HIGH SENSITIVITY): Troponin I (High Sensitivity): 17 ng/L (ref <18)

## 2020-06-25 MED ORDER — PHENAZOPYRIDINE HCL 100 MG PO TABS
95.0000 mg | ORAL_TABLET | Freq: Once | ORAL | Status: AC
  Administered 2020-06-25: 100 mg via ORAL
  Filled 2020-06-25: qty 1

## 2020-06-25 MED ORDER — LORAZEPAM 1 MG PO TABS
1.0000 mg | ORAL_TABLET | Freq: Once | ORAL | Status: AC
  Administered 2020-06-25: 1 mg via ORAL
  Filled 2020-06-25: qty 1

## 2020-06-25 NOTE — ED Notes (Signed)
called [facility] for transport

## 2020-06-25 NOTE — ED Notes (Signed)
Pt returned from CT °

## 2020-06-25 NOTE — ED Notes (Signed)
Pt is having panic attack- hx of anxiety and has been very anxious entire visit. RN did teaching and switched to leg bag. Pt keeps reporting she is exhausted and just "has been through the ringer"

## 2020-06-25 NOTE — ED Provider Notes (Addendum)
[facility] EMERGENCY DEPARTMENT Provider Note   CSN: [id number] Arrival date & time: 06/24/20  1841     History Chief Complaint  Patient presents with  . Chest Pain  . Urinary Tract Infection    [name] is a 69 y.o. female.  Patient presents to the ER with complaints of inability to urinate.  Patient reports that she has been experiencing right sided flank burning pain for several weeks.  Her OB/GYN placed her on antibiotic for presumed urinary tract infection.  She continued to have symptoms, PCP changed the antibiotic this week.  Patient reports that she has noticed progressive decrease in urine output, has not been able to pass any urine today.        Past Medical History:  Diagnosis Date  . Anxiety   . Atrophic vaginitis 10/09/2015  . Baker's cyst    left leg  . Blood in urine 10/09/2015  . CHF (congestive heart failure) ([place])   . COPD (chronic obstructive pulmonary disease) ([place])   . Hematuria 10/09/2015  . Hypertension   . Nicotine addiction 04/07/2014  . Pneumonia   . Urinary urgency 01/25/2016  . Vaginal bleeding 10/09/2015    Patient Active Problem List   Diagnosis Date Noted  . Hematochezia 11/09/2018  . Presence of left artificial elbow joint 06/29/2017  . Closed displaced fracture of head of radius with routine healing 06/29/2017  . Urinary urgency 01/25/2016  . Blood in urine 10/09/2015  . Vaginal bleeding 10/09/2015  . Atrophic vaginitis 10/09/2015  . Hematuria 10/09/2015  . GERD (gastroesophageal reflux disease) 09/04/2014  . Anemia 09/04/2014  . Constipation 07/14/2014  . Nicotine addiction 04/07/2014    Past Surgical History:  Procedure Laterality Date  . ABDOMINAL HYSTERECTOMY    . CATARACT EXTRACTION Left 03/2015  . CESAREAN SECTION    . COLONOSCOPY  10/19/2004   Dr. Gala Romney- internal hemorrhoids, o/w normal rectum, normal colon  . COLONOSCOPY N/A 07/30/2014   JJO:ACZYSAY diverticulosis. Colonic polyp-removed TA.  repeat TCS 07/2021  . ESOPHAGOGASTRODUODENOSCOPY N/A 07/30/2014   TKZ:SWFUXN EGD  . RADIAL HEAD ARTHROPLASTY Left 06/23/2017   Procedure: RADIAL HEAD ARTHROPLASTY WITH LIGAMENT REPAIR;  Surgeon: Meredith Pel, MD;  Location: [facility];  Service: Orthopedics;  Laterality: Left;     OB History    Gravida  1   Para  1   Term      Preterm      AB      Living  1     SAB      TAB      Ectopic      Multiple      Live Births  1           Family History  Adopted: Yes  Problem Relation Age of Onset  . Cancer Sister        pancreatic  . Migraines Daughter   . Cancer Daughter        pre cancerous cells on cervix  . Cancer Sister        breast cancer, colon  . Blindness Maternal Grandfather   . Other Brother        murdered  . Other Sister        ruptured colon  . Cancer Sister        breast, skin  . Other Brother        was in [place]  . Cancer Sister        pancreatic  .  Hypertension Sister   . Other Sister        ruptured colon  . Colon cancer Neg Hx     Social History   Tobacco Use  . Smoking status: Former Smoker    Years: 40.00    Types: Cigarettes    Quit date: 04/14/2013    Years since quitting: 7.2  . Smokeless tobacco: Never Used  Substance Use Topics  . Alcohol use: Yes    Comment: glass of wine maybe once a year  . Drug use: No    Home Medications Prior to Admission medications   Medication Sig Start Date End Date Taking? Authorizing Provider  acetaminophen (TYLENOL) 500 MG tablet Take 1,000 mg by mouth 2 (two) times daily as needed for mild pain or fever.   Yes [provider]  albuterol (PROVENTIL HFA;VENTOLIN HFA) 108 (90 BASE) MCG/ACT inhaler Inhale 2 puffs into the lungs every 6 (six) hours as needed for wheezing or shortness of breath. For shortness of breath     Yes [provider]  albuterol (PROVENTIL) (2.5 MG/3ML) 0.083% nebulizer solution Take 2.5 mg by nebulization every 6 (six) hours as needed for wheezing or  shortness of breath.   Yes [provider]  aspirin EC 81 MG tablet Take 81 mg by mouth daily. Swallow whole.   Yes [provider]  CARTIA XT 180 MG 24 hr capsule Take 180 mg by mouth 2 (two) times daily.  09/20/18  Yes [provider]  Fluticasone-Umeclidin-Vilant (TRELEGY ELLIPTA IN) Inhale 1 puff into the lungs daily.    Yes [provider]  furosemide (LASIX) 40 MG tablet Take 40 mg by mouth daily as needed for fluid or edema.    Yes [provider]  HYDROcodone-acetaminophen (NORCO) 10-325 MG tablet Take 1 tablet by mouth every 4 (four) hours as needed for moderate pain.  03/02/20  Yes [provider]  losartan-hydrochlorothiazide (HYZAAR) 100-25 MG tablet Take 1 tablet by mouth daily. 12/27/19  Yes [provider]  lubiprostone (AMITIZA) 24 MCG capsule Take 1 capsule (24 mcg total) by mouth 2 (two) times daily with a meal. 04/06/20  Yes Annitta Needs, NP  meloxicam (MOBIC) 7.5 MG tablet Take 7.5 mg by mouth 2 (two) times daily. 06/07/20  Yes [provider]  mirtazapine (REMERON) 7.5 MG tablet Take 7.5 mg by mouth at bedtime as needed (sleep).  02/25/20  Yes [provider]  Multiple Vitamin (MULTIVITAMIN WITH MINERALS) TABS Take 1 tablet by mouth daily.   Yes [provider]  potassium chloride SA (KLOR-CON) 20 MEQ tablet Take 20 mEq by mouth daily as needed. Take with the furosemide. 06/26/18  Yes [provider]  sulfamethoxazole-trimethoprim (BACTRIM DS) 800-160 MG tablet Take 1 tablet by mouth 2 (two) times daily. Patient taking differently: Take 1 tablet by mouth 2 (two) times daily. For 10 days starting 06/22/20 06/19/20  Yes Florian Buff, MD  naloxegol oxalate (MOVANTIK) 25 MG TABS tablet Take 1 tablet (25 mg total) by mouth daily. Patient not taking: Reported on 06/25/2020 04/02/20   Carlis Stable, NP    Allergies    Patient has no known allergies.  Review of Systems   Review of Systems    Genitourinary: Positive for decreased urine volume and flank pain.  All other systems reviewed and are negative.   Physical Exam Updated Vital Signs BP (!) 189/87 (BP Location: Right Arm)   Pulse 85   Temp 98.6 F (37 C) (Oral)  Resp 16   Ht 5\' 3"  (1.6 m)   Wt 76.4 kg   SpO2 98%   BMI 29.84 kg/m   Physical Exam Vitals and nursing note reviewed.  Constitutional:      General: She is not in acute distress.    Appearance: Normal appearance. She is well-developed.  HENT:     Head: Normocephalic and atraumatic.     Right Ear: Hearing normal.     Left Ear: Hearing normal.     Nose: Nose normal.  Eyes:     Conjunctiva/sclera: Conjunctivae normal.     Pupils: Pupils are equal, round, and reactive to light.  Cardiovascular:     Rate and Rhythm: Regular rhythm.     Heart sounds: S1 normal and S2 normal. No murmur heard.  No friction rub. No gallop.   Pulmonary:     Effort: Pulmonary effort is normal. No respiratory distress.     Breath sounds: Normal breath sounds.  Chest:     Chest wall: No tenderness.  Abdominal:     General: Bowel sounds are normal.     Palpations: Abdomen is soft.     Tenderness: There is abdominal tenderness in the suprapubic area. There is no guarding or rebound. Negative signs include Murphy's sign and McBurney's sign.     Hernia: No hernia is present.  Musculoskeletal:        General: Normal range of motion.     Cervical back: Normal range of motion and neck supple.  Skin:    General: Skin is warm and dry.     Findings: No rash.  Neurological:     Mental Status: She is alert and oriented to person, place, and time.     GCS: GCS eye subscore is 4. GCS verbal subscore is 5. GCS motor subscore is 6.     Cranial Nerves: No cranial nerve deficit.     Sensory: No sensory deficit.     Coordination: Coordination normal.  Psychiatric:        Speech: Speech normal.        Behavior: Behavior normal.        Thought Content: Thought content normal.      ED Results / Procedures / Treatments   Labs (all labs ordered are listed, but only abnormal results are displayed) Labs Reviewed  BASIC METABOLIC PANEL - Abnormal; Notable for the following components:      Result Value   Sodium 130 (*)    Chloride 82 (*)    CO2 36 (*)    Glucose, Bld 112 (*)    All other components within normal limits  CBC - Abnormal; Notable for the following components:   WBC 12.9 (*)    Hemoglobin 11.9 (*)    All other components within normal limits  URINALYSIS, ROUTINE W REFLEX MICROSCOPIC  TROPONIN I (HIGH SENSITIVITY)  TROPONIN I (HIGH SENSITIVITY)    EKG EKG Interpretation  Date/Time:  Wednesday June 24 2020 19:04:15 EDT Ventricular Rate:  90 PR Interval:  120 QRS Duration: 84 QT Interval:  372 QTC Calculation: 455 R Axis:   52 Text Interpretation: Normal sinus rhythm Septal infarct , age undetermined Abnormal ECG Confirmed by Orpah Greek [phone]) on 06/25/2020 5:33:21 AM   Radiology DG Chest 2 View  Result Date: 06/24/2020 CLINICAL DATA:  Chest pain EXAM: CHEST - 2 VIEW COMPARISON:  CT chest 07/25/2018.  Chest x-ray 07/05/2018 FINDINGS: Chronic scarring in the right lung base unchanged with volume loss and shift of  the heart to the right. Left lung is clear. Remainder of the right lung is clear. Heart size and vascularity normal. Negative for heart failure or effusion Lungs are hyperinflated compatible with COPD COPD with scarring in the right lung base. No superimposed acute abnormality. IMPRESSION: No active cardiopulmonary disease. Electronically Signed   By: Franchot Gallo M.D.   On: 06/24/2020 19:44   CT RENAL STONE STUDY  Result Date: 06/25/2020 CLINICAL DATA:  Urinary retention and bilateral flank pain. EXAM: CT ABDOMEN AND PELVIS WITHOUT CONTRAST TECHNIQUE: Multidetector CT imaging of the abdomen and pelvis was performed following the standard protocol without IV contrast. COMPARISON:  CT scan and 03/20/2013 FINDINGS: Lower  chest: Stable bibasilar scarring changes and evidence of prior right lower lobe lung surgery. The heart is normal in size. No pericardial effusion. Stable aortic calcifications. Hepatobiliary: No hepatic lesions are identified without contrast. The gallbladder is grossly normal by CT. No common bile duct dilatation. Pancreas: No mass, inflammation or ductal dilatation. Spleen: Normal size. No focal lesions. Adrenals/Urinary Tract: Stable left adrenal gland nodule measuring -3 Hounsfield units and consistent with benign adenoma. The right adrenal gland is normal. No renal, ureteral or bladder calculi are identified. No worrisome renal lesions without contrast. The bladder is mildly distended but no bladder wall thickening, mass or calculi. Stomach/Bowel: The stomach, duodenum, small bowel and colon are grossly normal without oral contrast. No inflammatory changes, mass lesions or obstructive findings. The appendix is normal. Vascular/Lymphatic: Moderate to advanced atherosclerotic calcifications involving the aorta and branch vessels but no aneurysm. No mesenteric or retroperitoneal mass or adenopathy. Reproductive: Surgically absent. Other: No pelvic mass or adenopathy. No free pelvic fluid collections. No inguinal mass or adenopathy. No abdominal wall hernia or subcutaneous lesions. Musculoskeletal: No significant bony findings. Moderate degenerative changes involving the lumbar spine. IMPRESSION: 1. No acute abdominal/pelvic findings, mass lesions or adenopathy. 2. No renal, ureteral or bladder calculi or mass. 3. Stable left adrenal gland adenoma. 4. Aortic atherosclerosis. Aortic Atherosclerosis (ICD10-I70.0). Electronically Signed   By: Marijo Sanes M.D.   On: 06/25/2020 06:19    Procedures Procedures (including critical care time)  Medications Ordered in ED Medications  sodium chloride flush (NS) 0.9 % injection 3 mL (3 mLs Intravenous Given 06/25/20 0533)    ED Course  I have reviewed the triage  vital signs and the nursing notes.  Pertinent labs & imaging results that were available during my care of the patient were reviewed by me and considered in my medical decision making (see chart for details).    MDM Rules/Calculators/A&P                          Patient presents to the emergency department for evaluation of urinary retention.  Patient has been noticing decreased urine output and has been experiencing right-sided flank pain.  She was unable to pass urine here in the ER.  Initial urinalysis was obtained by in and out catheterization after bladder scan showed 450 mL of urine.  CT scan shows distended bladder but no other abnormality.  Lab work was unremarkable.  Patient was being treated for UTI based on her symptoms but there is no evidence of infection on urinalysis.  Renal function is normal.  Patient still experiencing flank discomfort and sensation of urinary urgency but cannot urinate despite distended bladder on CT.  Will place Foley catheter.  Will discharge with catheter in place, follow-up with urology.  Final Clinical Impression(s) / ED  Diagnoses Final diagnoses:  Urinary retention    Rx / DC Orders ED Discharge Orders    None       Terence Googe, Gwenyth Allegra, MD 06/25/20 [id number]    Orpah Greek, MD 07/06/20 [phone]

## 2020-07-01 ENCOUNTER — Encounter: Payer: Self-pay | Admitting: Urology

## 2020-07-01 ENCOUNTER — Other Ambulatory Visit: Payer: Self-pay

## 2020-07-01 ENCOUNTER — Ambulatory Visit (INDEPENDENT_AMBULATORY_CARE_PROVIDER_SITE_OTHER): Payer: Medicare Other | Admitting: Urology

## 2020-07-01 VITALS — BP 137/83 | HR 90 | Temp 98.8°F | Ht 63.0 in | Wt 165.0 lb

## 2020-07-01 DIAGNOSIS — R339 Retention of urine, unspecified: Secondary | ICD-10-CM

## 2020-07-01 HISTORY — DX: Retention of urine, unspecified: R33.9

## 2020-07-01 LAB — BLADDER SCAN AMB NON-IMAGING: Scan Result: 15.7

## 2020-07-01 MED ORDER — TAMSULOSIN HCL 0.4 MG PO CAPS
0.4000 mg | ORAL_CAPSULE | Freq: Every day | ORAL | 11 refills | Status: DC
Start: 2020-07-01 — End: 2020-09-15

## 2020-07-01 NOTE — Patient Instructions (Signed)
Acute Urinary Retention, Female  Acute urinary retention means that you cannot pee (urinate) at all, or that you pee too little and your bladder is not emptied completely. If it is not treated, it can lead to kidney damage or other serious problems. Follow these instructions at home:  Take over-the-counter and prescription medicines only as told by your doctor. Ask your doctor what medicines you should stay away from. Do not take any medicine unless your doctor says it is okay to do so.  If you were sent home with a tube that drains pee from the bladder (catheter), take care of it as told by your doctor.  Drink enough fluid to keep your pee clear or pale yellow.  If you were given an antibiotic, take it as told by your doctor. Do not stop taking the antibiotic even if you start to feel better.  Do not use any products that contain nicotine or tobacco, such as cigarettes and e-cigarettes. If you need help quitting, ask your doctor.  Watch for changes in your symptoms. Tell your doctor about them.  If told, keep track of any changes in your blood pressure at home. Tell your doctor about them.  Keep all follow-up visits as told by your doctor. This is important. Contact a doctor if:  You have spasms or you leak pee when you have spasms. Get help right away if:  You have chills or a fever.  You have blood in your pee.  You have a tube that drains the bladder and: ? The tube stops draining pee. ? The tube falls out. Summary  Acute urinary retention means that you cannot pee at all, or that you pee too little and your bladder is not emptied completely. If it is not treated, it can result in kidney damage or other serious problems.  If you were sent home with a tube that drains pee from the bladder, take care of it as told by your doctor.  Pay attention to any changes in your symptoms. Tell your doctor about them. This information is not intended to replace advice given to you by your  health care provider. Make sure you discuss any questions you have with your health care provider. Document Revised: 11/24/2017 Document Reviewed: 01/13/2017 Elsevier Patient Education  2020 [employer]  

## 2020-07-01 NOTE — Progress Notes (Signed)
Urological Symptom Review  Patient is experiencing the following symptoms: Frequent urination Get up at night to urinate Weak stream   Review of Systems  Gastrointestinal (upper)  : Nausea  Gastrointestinal (lower) : Negative for lower GI symptoms  Constitutional : Fever Fatigue  Skin: Negative for skin symptoms  Eyes: Negative for eye symptoms  Ear/Nose/Throat : Negative for Ear/Nose/Throat symptoms  Hematologic/Lymphatic: Easy bruising  Cardiovascular : Leg swelling  Respiratory : Shortness of breath  Endocrine: Negative for endocrine symptoms  Musculoskeletal: Back pain  Neurological: Dizziness  Psychologic: Depression Anxiety

## 2020-07-01 NOTE — Progress Notes (Signed)
Fill and Pull Catheter Removal  Patient is present today for a catheter removal.  Patient was cleaned and prepped in a sterile fashion 1535ml of sterile water/ saline was instilled into the bladder when the patient felt the urge to urinate. 20ml of water was then drained from the balloon.  A 16FR foley cath was removed from the bladder no complications were noted .  Patient as then given some time to void on their own.  Patient cannot void  89ml on their own after some time.  Patient was allowed to leave without cath and told to come back this afternoon. Performed by: Valentina Lucks, LPN  Follow up/ Additional notes:

## 2020-07-01 NOTE — Progress Notes (Signed)
07/01/2020 9:10 AM   [name] 02-Mar-1951 [id number]  Referring provider: Asencion Noble, MD [address] [place],  [place] [address]  Urinary retention  HPI: [name] is a [phone] here for evaluation for urinary retention. She was seen in the ER on 7/1 with an inability to urinate to urinate and had a foley placed.  She was treated for a UTI but the culture from 06/19/2020 showed no growth.  She had a hysterectomy at age 69. 88 month ago she developed progressive weaker stream. She was treated with 2 courses on antibiotics which did improve her stream but then on 7/1 she was unable to urinate and presented to the ER. The patient is on home oxygen. No hx of urinary incontinence. She has longstanding nocturia 2x since her 69.  She is on amitiza for constipation.     PMH: Past Medical History:  Diagnosis Date  . Anxiety   . Atrophic vaginitis 10/09/2015  . Baker's cyst    left leg  . Blood in urine 10/09/2015  . CHF (congestive heart failure) ([place])   . COPD (chronic obstructive pulmonary disease) ([place])   . Hematuria 10/09/2015  . Hypertension   . Nicotine addiction 04/07/2014  . Pneumonia   . Urinary urgency 01/25/2016  . Vaginal bleeding 10/09/2015    Surgical History: Past Surgical History:  Procedure Laterality Date  . ABDOMINAL HYSTERECTOMY    . CATARACT EXTRACTION Left 03/2015  . CESAREAN SECTION    . COLONOSCOPY  10/19/2004   Dr. Gala Romney- internal hemorrhoids, o/w normal rectum, normal colon  . COLONOSCOPY N/A 07/30/2014   [id number] diverticulosis. Colonic polyp-removed TA. repeat TCS 07/2021  . ESOPHAGOGASTRODUODENOSCOPY N/A 07/30/2014   [id number]:[id number] EGD  . RADIAL HEAD ARTHROPLASTY Left 06/23/2017   Procedure: RADIAL HEAD ARTHROPLASTY WITH LIGAMENT REPAIR;  Surgeon: Meredith Pel, MD;  Location: [facility];  Service: Orthopedics;  Laterality: Left;    Home Medications:  Allergies as of 07/01/2020   No Known Allergies     Medication List       Accurate as  of July 01, 2020  9:10 AM. If you have any questions, ask your nurse or doctor.        acetaminophen 500 MG tablet Commonly known as: TYLENOL Take 1,000 mg by mouth 2 (two) times daily as needed for mild pain or fever.   albuterol 108 (90 Base) MCG/ACT inhaler Commonly known as: VENTOLIN HFA Inhale 2 puffs into the lungs every 6 (six) hours as needed for wheezing or shortness of breath. For shortness of breath   albuterol (2.5 MG/3ML) 0.083% nebulizer solution Commonly known as: PROVENTIL Take 2.5 mg by nebulization every 6 (six) hours as needed for wheezing or shortness of breath.   aspirin EC 81 MG tablet Take 81 mg by mouth daily. Swallow whole.   Cartia XT 180 MG 24 hr capsule Generic drug: diltiazem Take 180 mg by mouth 2 (two) times daily.   furosemide 40 MG tablet Commonly known as: LASIX Take 40 mg by mouth daily as needed for fluid or edema.   HYDROcodone-acetaminophen 10-325 MG tablet Commonly known as: NORCO Take 1 tablet by mouth every 4 (four) hours as needed for moderate pain.   losartan-hydrochlorothiazide 100-25 MG tablet Commonly known as: HYZAAR Take 1 tablet by mouth daily.   lubiprostone 24 MCG capsule Commonly known as: AMITIZA Take 1 capsule (24 mcg total) by mouth 2 (two) times daily with a meal.   meloxicam 7.5 MG tablet Commonly known as:  MOBIC Take 7.5 mg by mouth 2 (two) times daily.   mirtazapine 7.5 MG tablet Commonly known as: REMERON Take 7.5 mg by mouth at bedtime as needed (sleep).   multivitamin with minerals Tabs tablet Take 1 tablet by mouth daily.   naloxegol oxalate 25 MG Tabs tablet Commonly known as: Movantik Take 1 tablet (25 mg total) by mouth daily.   phenazopyridine 200 MG tablet Commonly known as: PYRIDIUM Take 200 mg by mouth 3 (three) times daily as needed.   potassium chloride SA 20 MEQ tablet Commonly known as: KLOR-CON Take 20 mEq by mouth daily as needed. Take with the furosemide.     sulfamethoxazole-trimethoprim 800-160 MG tablet Commonly known as: BACTRIM DS Take 1 tablet by mouth 2 (two) times daily. What changed: additional instructions   TRELEGY ELLIPTA IN Inhale 1 puff into the lungs daily.       Allergies: No Known Allergies  Family History: Family History  Adopted: Yes  Problem Relation Age of Onset  . Cancer Sister        pancreatic  . Migraines Daughter   . Cancer Daughter        pre cancerous cells on cervix  . Cancer Sister        breast cancer, colon  . Blindness Maternal Grandfather   . Other Brother        murdered  . Other Sister        ruptured colon  . Cancer Sister        breast, skin  . Other Brother        was in [place]  . Cancer Sister        pancreatic  . Hypertension Sister   . Other Sister        ruptured colon  . Colon cancer Neg Hx     Social History:  reports that she quit smoking about 7 years ago. Her smoking use included cigarettes. She quit after 40.00 years of use. She has never used smokeless tobacco. She reports current alcohol use. She reports that she does not use drugs.  ROS: All other review of systems were reviewed and are negative except what is noted above in HPI  Physical Exam: BP 137/83   Pulse 90   Temp 98.8 F (37.1 C)   Ht 5\' 3"  (1.6 m)   Wt 165 lb (74.8 kg)   BMI 29.23 kg/m   Constitutional:  Alert and oriented, No acute distress. HEENT: [place] AT, moist mucus membranes.  Trachea midline, no masses. Cardiovascular: No clubbing, cyanosis, or edema. Respiratory: Normal respiratory effort, no increased work of breathing. GI: Abdomen is soft, nontender, nondistended, no abdominal masses GU: No CVA tenderness.  Lymph: No cervical or inguinal lymphadenopathy. Skin: No rashes, bruises or suspicious lesions. Neurologic: Grossly intact, no focal deficits, moving all 4 extremities. Psychiatric: Normal mood and affect.  Laboratory Data: Lab Results  Component Value Date   WBC 12.9 (H) 06/24/2020    HGB 11.9 (L) 06/24/2020   HCT 39.2 06/24/2020   MCV 99.0 06/24/2020   PLT 214 06/24/2020    Lab Results  Component Value Date   CREATININE 0.79 06/24/2020    No results found for: PSA  No results found for: TESTOSTERONE  No results found for: HGBA1C  Urinalysis    Component Value Date/Time   COLORURINE YELLOW 06/24/2020 2120   APPEARANCEUR CLEAR 06/24/2020 2120   APPEARANCEUR Clear 01/25/2016 1600   LABSPEC 1.012 06/24/2020 2120   PHURINE 6.0 06/24/2020 2120  GLUCOSEU NEGATIVE 06/24/2020 2120   HGBUR NEGATIVE 06/24/2020 2120   BILIRUBINUR NEGATIVE 06/24/2020 2120   BILIRUBINUR Negative 01/25/2016 [place] 06/24/2020 2120   PROTEINUR NEGATIVE 06/24/2020 2120   UROBILINOGEN 0.2 08/17/2011 1302   NITRITE NEGATIVE 06/24/2020 2120   LEUKOCYTESUR NEGATIVE 06/24/2020 2120    Lab Results  Component Value Date   LABMICR See below: 01/25/2016   WBCUA 11-30 (A) 01/25/2016   RBCUA 0-2 01/25/2016   LABEPIT 0-10 01/25/2016   MUCUS Present 01/25/2016   BACTERIA None seen 01/25/2016    Pertinent Imaging:  No results found for this or any previous visit.  No results found for this or any previous visit.  No results found for this or any previous visit.  No results found for this or any previous visit.  No results found for this or any previous visit.  No results found for this or any previous visit.  No results found for this or any previous visit.  Results for orders placed during the hospital encounter of 06/24/20  CT RENAL STONE STUDY  Narrative CLINICAL DATA:  Urinary retention and bilateral flank pain.  EXAM: CT ABDOMEN AND PELVIS WITHOUT CONTRAST  TECHNIQUE: Multidetector CT imaging of the abdomen and pelvis was performed following the standard protocol without IV contrast.  COMPARISON:  CT scan and 03/20/2013  FINDINGS: Lower chest: Stable bibasilar scarring changes and evidence of prior right lower lobe lung surgery. The  heart is normal in size. No pericardial effusion. Stable aortic calcifications.  Hepatobiliary: No hepatic lesions are identified without contrast. The gallbladder is grossly normal by CT. No common bile duct dilatation.  Pancreas: No mass, inflammation or ductal dilatation.  Spleen: Normal size. No focal lesions.  Adrenals/Urinary Tract: Stable left adrenal gland nodule measuring -3 Hounsfield units and consistent with benign adenoma. The right adrenal gland is normal.  No renal, ureteral or bladder calculi are identified. No worrisome renal lesions without contrast. The bladder is mildly distended but no bladder wall thickening, mass or calculi.  Stomach/Bowel: The stomach, duodenum, small bowel and colon are grossly normal without oral contrast. No inflammatory changes, mass lesions or obstructive findings. The appendix is normal.  Vascular/Lymphatic: Moderate to advanced atherosclerotic calcifications involving the aorta and branch vessels but no aneurysm. No mesenteric or retroperitoneal mass or adenopathy.  Reproductive: Surgically absent.  Other: No pelvic mass or adenopathy. No free pelvic fluid collections. No inguinal mass or adenopathy. No abdominal wall hernia or subcutaneous lesions.  Musculoskeletal: No significant bony findings. Moderate degenerative changes involving the lumbar spine.  IMPRESSION: 1. No acute abdominal/pelvic findings, mass lesions or adenopathy. 2. No renal, ureteral or bladder calculi or mass. 3. Stable left adrenal gland adenoma. 4. Aortic atherosclerosis.  Aortic Atherosclerosis (ICD10-I70.0).   Electronically Signed By: Marijo Sanes M.D. On: 06/25/2020 06:19   Assessment & Plan:    1. Urinary retention -Voiding trial passed today. We will start her on flomax 0.4mg  daily. RTC 4-6 weeks with PVR   No follow-ups on file.  Nicolette Bang, MD  [facility] [place]

## 2020-07-02 ENCOUNTER — Telehealth: Payer: Self-pay | Admitting: Urology

## 2020-07-02 NOTE — Telephone Encounter (Signed)
Pt  Requests a return call from nurse. Having urinary issues.

## 2020-07-03 NOTE — Telephone Encounter (Signed)
Pt called and she started urinating on her own since going home without catheter.pt was calling to let us know she was able to void.

## 2020-07-27 ENCOUNTER — Encounter: Payer: Self-pay | Admitting: Nurse Practitioner

## 2020-08-03 ENCOUNTER — Ambulatory Visit: Payer: Medicare Other | Admitting: Urology

## 2020-08-04 ENCOUNTER — Other Ambulatory Visit: Payer: Medicare Other | Admitting: Adult Health

## 2020-08-17 ENCOUNTER — Ambulatory Visit: Payer: Medicare Other | Admitting: Urology

## 2020-08-17 ENCOUNTER — Other Ambulatory Visit: Payer: Self-pay | Admitting: Gastroenterology

## 2020-08-18 ENCOUNTER — Telehealth: Payer: Self-pay | Admitting: Internal Medicine

## 2020-08-18 ENCOUNTER — Other Ambulatory Visit: Payer: Self-pay | Admitting: Gastroenterology

## 2020-08-18 ENCOUNTER — Telehealth: Payer: Self-pay | Admitting: *Deleted

## 2020-08-18 NOTE — Telephone Encounter (Signed)
Patient called again about her movantic, called yesterday and is out of pills. Wants to know when someone will get it called in .  Sounded upset

## 2020-08-18 NOTE — Telephone Encounter (Signed)
Message was sent to [facility] refill pool today.

## 2020-08-18 NOTE — Telephone Encounter (Signed)
Pt called in and wants to know if we can refill her Lubiprostone 24 mcg today.  She said that she ran out on Sat.

## 2020-08-18 NOTE — Telephone Encounter (Signed)
Routing to [facility] refill box. Spoke with pt. Pt states she is out of her meds and wants a refill.

## 2020-08-19 ENCOUNTER — Telehealth: Payer: Self-pay | Admitting: Internal Medicine

## 2020-08-19 NOTE — Telephone Encounter (Signed)
Pt called to see if we had sent a refill for her prescription because she's been out of it since Saturday. I told her the nurse was aware and it will be sent this afternoon.

## 2020-08-19 NOTE — Telephone Encounter (Signed)
Message was sent to provider. 

## 2020-08-19 NOTE — Telephone Encounter (Signed)
Routing to Anna Boone, NP.  °

## 2020-08-19 NOTE — Telephone Encounter (Signed)
Completed.

## 2020-08-20 ENCOUNTER — Other Ambulatory Visit: Payer: Medicare Other | Admitting: Adult Health

## 2020-09-09 ENCOUNTER — Telehealth: Payer: Medicare Other | Admitting: Urology

## 2020-09-15 ENCOUNTER — Other Ambulatory Visit: Payer: Self-pay

## 2020-09-15 ENCOUNTER — Encounter: Payer: Self-pay | Admitting: Urology

## 2020-09-15 ENCOUNTER — Telehealth (INDEPENDENT_AMBULATORY_CARE_PROVIDER_SITE_OTHER): Payer: Medicare Other | Admitting: Urology

## 2020-09-15 DIAGNOSIS — R339 Retention of urine, unspecified: Secondary | ICD-10-CM

## 2020-09-15 MED ORDER — TAMSULOSIN HCL 0.4 MG PO CAPS: 0.4000 mg | ORAL_CAPSULE | Freq: Every day | ORAL | 11 refills | Status: DC

## 2020-09-15 NOTE — Progress Notes (Signed)
09/15/2020 4:28 PM   [name] 1951-08-21 [id number]  Referring provider: Asencion Noble, MD [address] [place],  [place] [address]  Difficulty urinating  HPI: [name] is a 69yo seen via telehealth for difficulty urinating. Last visit she was started on flomax 0.4mg  daily. She has a good urinary stream flomax. She has urinary frequency every 2-3 hours. Nocturia improved to 2x. She has intermittent hesitancy. No dysuria or hematuria. No feeling of incomplete emptying  I connected with  RESSIE SLEVIN on 09/15/20 by a video enabled telemedicine application and verified that I am speaking with the correct person using two identifiers.   I discussed the limitations of evaluation and management by telemedicine. The patient expressed understanding and agreed to proceed.    PMH: Past Medical History:  Diagnosis Date  . Anxiety   . Atrophic vaginitis 10/09/2015  . Baker's cyst    left leg  . Blood in urine 10/09/2015  . CHF (congestive heart failure) ([place])   . COPD (chronic obstructive pulmonary disease) ([place])   . Hematuria 10/09/2015  . Hypertension   . Nicotine addiction 04/07/2014  . Pneumonia   . Urinary urgency 01/25/2016  . Vaginal bleeding 10/09/2015    Surgical History: Past Surgical History:  Procedure Laterality Date  . ABDOMINAL HYSTERECTOMY    . CATARACT EXTRACTION Left 03/2015  . CESAREAN SECTION    . COLONOSCOPY  10/19/2004   Dr. Gala Romney- internal hemorrhoids, o/w normal rectum, normal colon  . COLONOSCOPY N/A 07/30/2014   [id number] diverticulosis. Colonic polyp-removed TA. repeat TCS 07/2021  . ESOPHAGOGASTRODUODENOSCOPY N/A 07/30/2014   [id number] EGD  . RADIAL HEAD ARTHROPLASTY Left 06/23/2017   Procedure: RADIAL HEAD ARTHROPLASTY WITH LIGAMENT REPAIR;  Surgeon: Meredith Pel, MD;  Location: [facility];  Service: Orthopedics;  Laterality: Left;    Home Medications:  Allergies as of 09/15/2020   No Known Allergies     Medication List         Accurate as of September 15, 2020  4:28 PM. If you have any questions, ask your nurse or doctor.        acetaminophen 500 MG tablet Commonly known as: TYLENOL Take 1,000 mg by mouth 2 (two) times daily as needed for mild pain or fever.   albuterol 108 (90 Base) MCG/ACT inhaler Commonly known as: VENTOLIN HFA Inhale 2 puffs into the lungs every 6 (six) hours as needed for wheezing or shortness of breath. For shortness of breath   albuterol (2.5 MG/3ML) 0.083% nebulizer solution Commonly known as: PROVENTIL Take 2.5 mg by nebulization every 6 (six) hours as needed for wheezing or shortness of breath.   aspirin EC 81 MG tablet Take 81 mg by mouth daily. Swallow whole.   Cartia XT 180 MG 24 hr capsule Generic drug: diltiazem Take 180 mg by mouth 2 (two) times daily.   furosemide 40 MG tablet Commonly known as: LASIX Take 40 mg by mouth daily as needed for fluid or edema.   HYDROcodone-acetaminophen 10-325 MG tablet Commonly known as: NORCO Take 1 tablet by mouth every 4 (four) hours as needed for moderate pain.   losartan-hydrochlorothiazide 100-25 MG tablet Commonly known as: HYZAAR Take 1 tablet by mouth daily.   lubiprostone 24 MCG capsule Commonly known as: AMITIZA TAKE 1 CAPSULE(24 MCG) BY MOUTH TWICE DAILY WITH A MEAL   meloxicam 7.5 MG tablet Commonly known as: MOBIC Take 7.5 mg by mouth 2 (two) times daily.   mirtazapine 7.5 MG tablet Commonly known as:  REMERON Take 7.5 mg by mouth at bedtime as needed (sleep).   multivitamin with minerals Tabs tablet Take 1 tablet by mouth daily.   naloxegol oxalate 25 MG Tabs tablet Commonly known as: Movantik Take 1 tablet (25 mg total) by mouth daily.   phenazopyridine 200 MG tablet Commonly known as: PYRIDIUM Take 200 mg by mouth 3 (three) times daily as needed.   potassium chloride SA 20 MEQ tablet Commonly known as: KLOR-CON Take 20 mEq by mouth daily as needed. Take with the furosemide.    sulfamethoxazole-trimethoprim 800-160 MG tablet Commonly known as: BACTRIM DS Take 1 tablet by mouth 2 (two) times daily. What changed: additional instructions   tamsulosin 0.4 MG Caps capsule Commonly known as: FLOMAX Take 1 capsule (0.4 mg total) by mouth daily after supper.   TRELEGY ELLIPTA IN Inhale 1 puff into the lungs daily.       Allergies: No Known Allergies  Family History: Family History  Adopted: Yes  Problem Relation Age of Onset  . Cancer Sister        pancreatic  . Migraines Daughter   . Cancer Daughter        pre cancerous cells on cervix  . Cancer Sister        breast cancer, colon  . Blindness Maternal Grandfather   . Other Brother        murdered  . Other Sister        ruptured colon  . Cancer Sister        breast, skin  . Other Brother        was in [place]  . Cancer Sister        pancreatic  . Hypertension Sister   . Other Sister        ruptured colon  . Colon cancer Neg Hx     Social History:  reports that she quit smoking about 7 years ago. Her smoking use included cigarettes. She quit after 40.00 years of use. She has never used smokeless tobacco. She reports current alcohol use. She reports that she does not use drugs.  ROS: All other review of systems were reviewed and are negative except what is noted above in HPI  Physical Exam: There were no vitals taken for this visit.  Constitutional:  Alert and oriented, No acute distress. HEENT: [place] AT, moist mucus membranes.  Trachea midline, no masses. Cardiovascular: No clubbing, cyanosis, or edema. Respiratory: Normal respiratory effort, no increased work of breathing. GI: Abdomen is soft, nontender, nondistended, no abdominal masses GU: No CVA tenderness.  Lymph: No cervical or inguinal lymphadenopathy. Skin: No rashes, bruises or suspicious lesions. Neurologic: Grossly intact, no focal deficits, moving all 4 extremities. Psychiatric: Normal mood and affect.  Laboratory Data: Lab  Results  Component Value Date   WBC 12.9 (H) 06/24/2020   HGB 11.9 (L) 06/24/2020   HCT 39.2 06/24/2020   MCV 99.0 06/24/2020   PLT 214 06/24/2020    Lab Results  Component Value Date   CREATININE 0.79 06/24/2020    No results found for: PSA  No results found for: TESTOSTERONE  No results found for: HGBA1C  Urinalysis    Component Value Date/Time   COLORURINE YELLOW 06/24/2020 2120   APPEARANCEUR CLEAR 06/24/2020 2120   APPEARANCEUR Clear 01/25/2016 1600   LABSPEC 1.012 06/24/2020 2120   PHURINE 6.0 06/24/2020 2120   GLUCOSEU NEGATIVE 06/24/2020 2120   HGBUR NEGATIVE 06/24/2020 2120   BILIRUBINUR NEGATIVE 06/24/2020 2120   BILIRUBINUR Negative 01/25/2016  [place] 06/24/2020 2120   PROTEINUR NEGATIVE 06/24/2020 2120   UROBILINOGEN 0.2 08/17/2011 1302   NITRITE NEGATIVE 06/24/2020 2120   LEUKOCYTESUR NEGATIVE 06/24/2020 2120    Lab Results  Component Value Date   LABMICR See below: 01/25/2016   WBCUA 11-30 (A) 01/25/2016   RBCUA 0-2 01/25/2016   LABEPIT 0-10 01/25/2016   MUCUS Present 01/25/2016   BACTERIA None seen 01/25/2016    Pertinent Imaging:  No results found for this or any previous visit.  No results found for this or any previous visit.  No results found for this or any previous visit.  No results found for this or any previous visit.  No results found for this or any previous visit.  No results found for this or any previous visit.  No results found for this or any previous visit.  Results for orders placed during the hospital encounter of 06/24/20  CT RENAL STONE STUDY  Narrative CLINICAL DATA:  Urinary retention and bilateral flank pain.  EXAM: CT ABDOMEN AND PELVIS WITHOUT CONTRAST  TECHNIQUE: Multidetector CT imaging of the abdomen and pelvis was performed following the standard protocol without IV contrast.  COMPARISON:  CT scan and 03/20/2013  FINDINGS: Lower chest: Stable bibasilar scarring changes and  evidence of prior right lower lobe lung surgery. The heart is normal in size. No pericardial effusion. Stable aortic calcifications.  Hepatobiliary: No hepatic lesions are identified without contrast. The gallbladder is grossly normal by CT. No common bile duct dilatation.  Pancreas: No mass, inflammation or ductal dilatation.  Spleen: Normal size. No focal lesions.  Adrenals/Urinary Tract: Stable left adrenal gland nodule measuring -3 Hounsfield units and consistent with benign adenoma. The right adrenal gland is normal.  No renal, ureteral or bladder calculi are identified. No worrisome renal lesions without contrast. The bladder is mildly distended but no bladder wall thickening, mass or calculi.  Stomach/Bowel: The stomach, duodenum, small bowel and colon are grossly normal without oral contrast. No inflammatory changes, mass lesions or obstructive findings. The appendix is normal.  Vascular/Lymphatic: Moderate to advanced atherosclerotic calcifications involving the aorta and branch vessels but no aneurysm. No mesenteric or retroperitoneal mass or adenopathy.  Reproductive: Surgically absent.  Other: No pelvic mass or adenopathy. No free pelvic fluid collections. No inguinal mass or adenopathy. No abdominal wall hernia or subcutaneous lesions.  Musculoskeletal: No significant bony findings. Moderate degenerative changes involving the lumbar spine.  IMPRESSION: 1. No acute abdominal/pelvic findings, mass lesions or adenopathy. 2. No renal, ureteral or bladder calculi or mass. 3. Stable left adrenal gland adenoma. 4. Aortic atherosclerosis.  Aortic Atherosclerosis (ICD10-I70.0).   Electronically Signed By: Marijo Sanes M.D. On: 06/25/2020 06:19   Assessment & Plan:    1. Urinary retention -Patient is doing well on flomax 0.4mg  daily. She will followup in 6 months   No follow-ups on file.  Nicolette Bang, MD  [facility] [place]

## 2020-09-15 NOTE — Patient Instructions (Signed)
Acute Urinary Retention, Female  Acute urinary retention means that you cannot pee (urinate) at all, or that you pee too little and your bladder is not emptied completely. If it is not treated, it can lead to kidney damage or other serious problems. Follow these instructions at home:  Take over-the-counter and prescription medicines only as told by your doctor. Ask your doctor what medicines you should stay away from. Do not take any medicine unless your doctor says it is okay to do so.  If you were sent home with a tube that drains pee from the bladder (catheter), take care of it as told by your doctor.  Drink enough fluid to keep your pee clear or pale yellow.  If you were given an antibiotic, take it as told by your doctor. Do not stop taking the antibiotic even if you start to feel better.  Do not use any products that contain nicotine or tobacco, such as cigarettes and e-cigarettes. If you need help quitting, ask your doctor.  Watch for changes in your symptoms. Tell your doctor about them.  If told, keep track of any changes in your blood pressure at home. Tell your doctor about them.  Keep all follow-up visits as told by your doctor. This is important. Contact a doctor if:  You have spasms or you leak pee when you have spasms. Get help right away if:  You have chills or a fever.  You have blood in your pee.  You have a tube that drains the bladder and: ? The tube stops draining pee. ? The tube falls out. Summary  Acute urinary retention means that you cannot pee at all, or that you pee too little and your bladder is not emptied completely. If it is not treated, it can result in kidney damage or other serious problems.  If you were sent home with a tube that drains pee from the bladder, take care of it as told by your doctor.  Pay attention to any changes in your symptoms. Tell your doctor about them. This information is not intended to replace advice given to you by your  health care provider. Make sure you discuss any questions you have with your health care provider. Document Revised: 11/24/2017 Document Reviewed: 01/13/2017 Elsevier Patient Education  2020 [employer]  

## 2020-09-17 ENCOUNTER — Other Ambulatory Visit: Payer: Self-pay | Admitting: Gastroenterology

## 2020-09-29 ENCOUNTER — Ambulatory Visit: Payer: Medicare Other | Admitting: Nurse Practitioner

## 2020-10-12 ENCOUNTER — Telehealth: Payer: Self-pay | Admitting: *Deleted

## 2020-10-12 NOTE — Telephone Encounter (Signed)
Pt consented to a telephone visit. °

## 2020-10-12 NOTE — Telephone Encounter (Signed)
[name], you are scheduled for a virtual visit with your provider today.  Just as we do with appointments in the office, we must obtain your consent to participate.  Your consent will be active for this visit and any virtual visit you may have with one of our providers in the next 365 days.  If you have a MyChart account, I can also send a copy of this consent to you electronically.  All virtual visits are billed to your insurance company just like a traditional visit in the office.  As this is a virtual visit, video technology does not allow for your provider to perform a traditional examination.  This may limit your provider's ability to fully assess your condition.  If your provider identifies any concerns that need to be evaluated in person or the need to arrange testing such as labs, EKG, etc, we will make arrangements to do so.  Although advances in technology are sophisticated, we cannot ensure that it will always work on either your end or our end.  If the connection with a video visit is poor, we may have to switch to a telephone visit.  With either a video or telephone visit, we are not always able to ensure that we have a secure connection.   I need to obtain your verbal consent now.   Are you willing to proceed with your visit today?  

## 2020-10-13 ENCOUNTER — Ambulatory Visit (INDEPENDENT_AMBULATORY_CARE_PROVIDER_SITE_OTHER): Payer: Medicare Other | Admitting: Internal Medicine

## 2020-10-13 ENCOUNTER — Ambulatory Visit: Payer: Medicare Other | Admitting: Nurse Practitioner

## 2020-10-13 ENCOUNTER — Other Ambulatory Visit: Payer: Self-pay

## 2020-10-13 ENCOUNTER — Encounter: Payer: Self-pay | Admitting: Internal Medicine

## 2020-10-13 DIAGNOSIS — R194 Change in bowel habit: Secondary | ICD-10-CM | POA: Diagnosis not present

## 2020-10-13 DIAGNOSIS — K5903 Drug induced constipation: Secondary | ICD-10-CM

## 2020-10-13 DIAGNOSIS — Z86010 Personal history of colonic polyps: Secondary | ICD-10-CM

## 2020-10-13 NOTE — Progress Notes (Signed)
Referring Provider:  Primary Care Physician:  Asencion Noble, MD  Primary GI: Dr. Gala Romney  Patient Location: Home   Provider Location: [facility] office   Reason for Visit:  Single explosive bowel movement yesterday   Persons present on the virtual encounter, with roles:    Total time (minutes) spent on medical discussion: 14 minutes   Due to COVID-19, visit was conducted using virtual method.  Visit was requested by patient.  Virtual Visit via Telephone Note Due to COVID-19, visit is conducted virtually and was requested by patient.   I connected with [name] on 10/13/20 at 10:00 AM EDT by telephone and verified that I am speaking with the correct person using two identifiers.   I discussed the limitations, risks, security and privacy concerns of performing an evaluation and management service by telephone and the availability of in person appointments. I also discussed with the patient that there may be a patient responsible charge related to this service. The patient expressed understanding and agreed to proceed.  Chief Complaint  Patient presents with  . Diarrhea    green watery stool yesterday morning     History of Present Illness:  69 year old lady with chronic constipation (primarily opioid induced) fairly well managed on Amitiza 24 mcg twice daily.  Doing well until yesterday when she had an explosive bowel movement of loose green stool.  Single episode.  Mild associated abdominal cramps.  No bleeding.  Bowel function almost back to baseline today.  No fever or chills.  No sick contacts.  Patient ate takeout from a restaurant which was at room temperature brought by her grand children 2 days earlier.  She ate on the takeout food over a couple of meals. Small adenoma removed recall in 2015; due for surveillance colonoscopy in 1 year.  She is on home O2 and has been self sequestering due to the pandemic.  Unfortunately, lost her husband on the fifth of this month.  Her  daughter suffered a CVA and is staying with her currently.   Past Medical History:  Diagnosis Date  . Anxiety   . Atrophic vaginitis 10/09/2015  . Baker's cyst    left leg  . Blood in urine 10/09/2015  . CHF (congestive heart failure) ([place])   . COPD (chronic obstructive pulmonary disease) ([place])   . Hematuria 10/09/2015  . Hypertension   . Nicotine addiction 04/07/2014  . Pneumonia   . Urinary urgency 01/25/2016  . Vaginal bleeding 10/09/2015     Past Surgical History:  Procedure Laterality Date  . ABDOMINAL HYSTERECTOMY    . CATARACT EXTRACTION Left 03/2015  . CESAREAN SECTION    . COLONOSCOPY  10/19/2004   Dr. Gala Romney- internal hemorrhoids, o/w normal rectum, normal colon  . COLONOSCOPY N/A 07/30/2014   EZM:[id number] diverticulosis. Colonic polyp-removed TA. repeat TCS 07/2021  . ESOPHAGOGASTRODUODENOSCOPY N/A 07/30/2014   [id number] EGD  . RADIAL HEAD ARTHROPLASTY Left 06/23/2017   Procedure: RADIAL HEAD ARTHROPLASTY WITH LIGAMENT REPAIR;  Surgeon: Meredith Pel, MD;  Location: [facility];  Service: Orthopedics;  Laterality: Left;     Current Meds  Medication Sig  . albuterol (PROVENTIL HFA;VENTOLIN HFA) 108 (90 BASE) MCG/ACT inhaler Inhale 2 puffs into the lungs every 6 (six) hours as needed for wheezing or shortness of breath. For shortness of breath    . albuterol (PROVENTIL) (2.5 MG/3ML) 0.083% nebulizer solution Take 2.5 mg by nebulization every 6 (six) hours as needed for wheezing or shortness of breath.  Marland Kitchen aspirin EC 81  MG tablet Take 81 mg by mouth daily. Swallow whole.  Marland Kitchen atorvastatin (LIPITOR) 40 MG tablet Take 40 mg by mouth at bedtime.  Marland Kitchen CARTIA XT 180 MG 24 hr capsule Take 180 mg by mouth 2 (two) times daily.   . Fluticasone-Umeclidin-Vilant (TRELEGY ELLIPTA IN) Inhale 1 puff into the lungs daily.   . furosemide (LASIX) 40 MG tablet Take 40 mg by mouth daily as needed for fluid or edema.   Marland Kitchen HYDROcodone-acetaminophen (NORCO) 10-325 MG tablet Take 1 tablet by mouth as  needed for moderate pain.   Marland Kitchen losartan-hydrochlorothiazide (HYZAAR) 100-25 MG tablet Take 1 tablet by mouth daily.  Marland Kitchen lubiprostone (AMITIZA) 24 MCG capsule TAKE 1 CAPSULE(24 MCG) BY MOUTH TWICE DAILY WITH A MEAL  . mirtazapine (REMERON) 7.5 MG tablet Take 7.5 mg by mouth at bedtime as needed (sleep).   . Multiple Vitamin (MULTIVITAMIN WITH MINERALS) TABS Take 1 tablet by mouth daily.  . potassium chloride SA (KLOR-CON) 20 MEQ tablet Take 20 mEq by mouth daily as needed. Take with the furosemide.  . tamsulosin (FLOMAX) 0.4 MG CAPS capsule Take 1 capsule (0.4 mg total) by mouth daily after supper.     Family History  Adopted: Yes  Problem Relation Age of Onset  . Cancer Sister        pancreatic  . Migraines Daughter   . Cancer Daughter        pre cancerous cells on cervix  . Cancer Sister        breast cancer, colon  . Blindness Maternal Grandfather   . Other Brother        murdered  . Other Sister        ruptured colon  . Cancer Sister        breast, skin  . Other Brother        was in [place]  . Cancer Sister        pancreatic  . Hypertension Sister   . Other Sister        ruptured colon  . Colon cancer Neg Hx     Social History   Socioeconomic History  . Marital status: Single    Spouse name: Not on file  . Number of children: Not on file  . Years of education: Not on file  . Highest education level: Not on file  Occupational History  . Occupation: retired  Tobacco Use  . Smoking status: Former Smoker    Years: 40.00    Types: Cigarettes    Quit date: 04/14/2013    Years since quitting: 7.5  . Smokeless tobacco: Never Used  Substance and Sexual Activity  . Alcohol use: Yes    Comment: glass of wine maybe once a year  . Drug use: No  . Sexual activity: Never    Birth control/protection: Surgical    Comment: hyst  Other Topics Concern  . Not on file  Social History Narrative  . Not on file   Social Determinants of Health   Financial Resource Strain:   .  Difficulty of Paying Living Expenses: Not on file  Food Insecurity:   . Worried About Charity fundraiser in the Last Year: Not on file  . Ran Out of Food in the Last Year: Not on file  Transportation Needs:   . Lack of Transportation (Medical): Not on file  . Lack of Transportation (Non-Medical): Not on file  Physical Activity:   . Days of Exercise per Week: Not on file  .  Minutes of Exercise per Session: Not on file  Stress:   . Feeling of Stress : Not on file  Social Connections:   . Frequency of Communication with Friends and Family: Not on file  . Frequency of Social Gatherings with Friends and Family: Not on file  . Attends Religious Services: Not on file  . Active Member of Clubs or Organizations: Not on file  . Attends Archivist Meetings: Not on file  . Marital Status: Not on file       Review of Systems: As in history of present illness   Observations/Objective: No distress. Unable to perform physical exam due to telephone encounter. No video available.   Assessment and Plan: 69 year old lady with  chronic opioid-induced constipation describes an urgent voluminous BM yesterday  - somewhat loose.  No blood.  Self-limiting phenomenon.  She rapidly has returned back to baseline. No alarm symptoms.  Episode entirely nonspecific.  May or may not been related to assumption of takeout food. At baseline, her OIC is fairly well managed with Amitiza. History of colonic adenoma-due for surveillance in 1 year.   Follow Up Instructions:  At this time, would resume her usual regimen of Amitiza 24 mcg twice daily with meals.  If she has any deviation in her baseline bowel function, she is to call me.  Otherwise, plan to see her back in the office in 6 months.  We will look towards performing a colonoscopy in 2022 for surveillance purposes.     I discussed the assessment and treatment plan with the patient. The patient was provided an opportunity to ask questions  and all were answered. The patient agreed with the plan and demonstrated an understanding of the instructions.   The patient was advised to call back or seek an in-person evaluation if the symptoms worsen or if the condition fails to improve as anticipated.  I provided 14 minutes of non-face-to-face time during this encounter.  Bridgette Habermann, MD [facility]

## 2020-11-25 ENCOUNTER — Telehealth: Payer: Self-pay

## 2020-11-25 NOTE — Telephone Encounter (Signed)
Pt called c/o urine stream slowing down. Wanted to know if she could double her Tamsulosin. Spoke with Dr. Alyson Ingles. He gave permission for her too. Pt called and notified.

## 2020-11-26 ENCOUNTER — Telehealth: Payer: Self-pay

## 2020-11-26 NOTE — Telephone Encounter (Signed)
Pt called saying she took one dose of the Tamsulosin doubled last night and had "one good pee." Then said she "peed a little this morning". Then asked  what she should do. Pt is very on edge d/t an incident at ER recently. I explained to pt she has only taken 1 dose of the med doubled up so she has to give it time to see if it helps. Pt said she just didn't want to end up in Er again this weekend. Tried to reassure pt have to give it time to see if it does actually helping.

## 2020-11-30 ENCOUNTER — Emergency Department (HOSPITAL_COMMUNITY)
Admission: EM | Admit: 2020-11-30 | Discharge: 2020-11-30 | Disposition: A | Discharge status: Home / Self Care | Payer: Medicare Other

## 2020-11-30 ENCOUNTER — Other Ambulatory Visit: Payer: Self-pay

## 2020-11-30 ENCOUNTER — Telehealth: Payer: Self-pay

## 2020-11-30 NOTE — Telephone Encounter (Signed)
Pt called back again to make sure I had her number in case Dr. Alyson Ingles answered the task about her. Assured her I did. Then asked when he would be back in office, I said tomorrow. Pt stated she could not wait that long.

## 2020-12-01 ENCOUNTER — Telehealth: Payer: Self-pay

## 2020-12-01 ENCOUNTER — Ambulatory Visit (INDEPENDENT_AMBULATORY_CARE_PROVIDER_SITE_OTHER): Payer: Medicare Other

## 2020-12-01 DIAGNOSIS — R339 Retention of urine, unspecified: Secondary | ICD-10-CM

## 2020-12-01 LAB — URINALYSIS, ROUTINE W REFLEX MICROSCOPIC
Bilirubin, UA: NEGATIVE
Glucose, UA: NEGATIVE
Ketones, UA: NEGATIVE
Leukocytes,UA: NEGATIVE
Nitrite, UA: NEGATIVE
Protein,UA: NEGATIVE
RBC, UA: NEGATIVE
Specific Gravity, UA: 1.015 (ref 1.005–1.030)
Urobilinogen, Ur: 0.2 mg/dL (ref 0.2–1.0)
pH, UA: 7 (ref 5.0–7.5)

## 2020-12-01 NOTE — Telephone Encounter (Signed)
Pt made aware of negative ua results.

## 2020-12-01 NOTE — Telephone Encounter (Signed)
Patient called asking for UA results 

## 2020-12-01 NOTE — Progress Notes (Signed)
Pt called complaining of difficulty voiding and urgency. Pt brought into office for PVR, ua, &culture.   Bladder Scan Patient cannot void: 56 ml Performed By: Khadijah Mastrianni,LPN  Pt given water to drink so she can leave a specimen. After sometime pt still cannot void.   Pt is prepped for and in and out catherization. Patient was cleaned and prepped in a sterle fashion with betadine. A 14 fr catheter foley was inserted. Urine return was noted @ 60 ml.  Performed by Maryiah Olvey,lpn  Urine sent for ua & cx

## 2020-12-04 LAB — URINE CULTURE: Organism ID, Bacteria: NO GROWTH

## 2020-12-07 ENCOUNTER — Telehealth: Payer: Self-pay

## 2020-12-07 NOTE — Telephone Encounter (Signed)
Pt notified of results

## 2020-12-07 NOTE — Telephone Encounter (Signed)
-----   Message from Cleon Gustin, MD sent at 12/07/2020  8:16 AM EST ----- negative ----- Message ----- From: Dorisann Frames, RN Sent: 12/04/2020  11:01 AM EST To: Cleon Gustin, MD  Cathed urine specimen per pt request

## 2020-12-09 ENCOUNTER — Telehealth: Payer: Self-pay

## 2020-12-09 ENCOUNTER — Other Ambulatory Visit: Payer: Self-pay

## 2020-12-09 MED ORDER — TAMSULOSIN HCL 0.4 MG PO CAPS
0.4000 mg | ORAL_CAPSULE | Freq: Two times a day (BID) | ORAL | 4 refills | Status: DC
Start: 2020-12-09 — End: 2021-06-29

## 2020-12-09 NOTE — Telephone Encounter (Signed)
Pt called needing refill on Tamsulosin. Wanted 90 pills ordered d/t Dr. Alyson Ingles doubling med. Refill sent in.

## 2020-12-11 ENCOUNTER — Ambulatory Visit (INDEPENDENT_AMBULATORY_CARE_PROVIDER_SITE_OTHER): Payer: Medicare Other | Admitting: Urology

## 2020-12-11 ENCOUNTER — Other Ambulatory Visit: Payer: Self-pay

## 2020-12-11 ENCOUNTER — Encounter: Payer: Self-pay | Admitting: Urology

## 2020-12-11 VITALS — BP 160/77 | HR 106 | Temp 98.9°F | Ht 63.0 in | Wt 168.0 lb

## 2020-12-11 DIAGNOSIS — R339 Retention of urine, unspecified: Secondary | ICD-10-CM

## 2020-12-11 LAB — URINALYSIS, ROUTINE W REFLEX MICROSCOPIC
Bilirubin, UA: NEGATIVE
Glucose, UA: NEGATIVE
Ketones, UA: NEGATIVE
Nitrite, UA: NEGATIVE
Protein,UA: NEGATIVE
RBC, UA: NEGATIVE
Specific Gravity, UA: 1.02 (ref 1.005–1.030)
Urobilinogen, Ur: 0.2 mg/dL (ref 0.2–1.0)
pH, UA: 6.5 (ref 5.0–7.5)

## 2020-12-11 LAB — BLADDER SCAN AMB NON-IMAGING: Scan Result: 37

## 2020-12-11 LAB — MICROSCOPIC EXAMINATION
Bacteria, UA: NONE SEEN
RBC, Urine: NONE SEEN /hpf (ref 0–2)
Renal Epithel, UA: NONE SEEN /hpf

## 2020-12-11 NOTE — Progress Notes (Signed)
12/11/2020 3:02 PM   [name] 02-Feb-1951 [id number]  Referring provider: Asencion Noble, MD [address] [place],   [address]  followup urinary retention  HPI: [name] is a 69yo here for followup for urinary retention. She is taking flomax BID. She has urinary hesitancy which is worse at night. She urinates every 4-5 hours. Urine stream ok. He has urgency to urinate and then has to strain to urinate. She has nocturia 2-3x. She is very unhappy with her urination and she is straining to urinate and has anxiety related to her inability to initiate her urinary.    PMH: Past Medical History:  Diagnosis Date  . Anxiety   . Atrophic vaginitis 10/09/2015  . Baker's cyst    left leg  . Blood in urine 10/09/2015  . CHF (congestive heart failure) ([place])   . COPD (chronic obstructive pulmonary disease) ([place])   . Hematuria 10/09/2015  . Hypertension   . Nicotine addiction 04/07/2014  . Pneumonia   . Urinary urgency 01/25/2016  . Vaginal bleeding 10/09/2015    Surgical History: Past Surgical History:  Procedure Laterality Date  . ABDOMINAL HYSTERECTOMY    . CATARACT EXTRACTION Left 03/2015  . CESAREAN SECTION    . COLONOSCOPY  10/19/2004   [name]- internal hemorrhoids, o/w normal rectum, normal colon  . COLONOSCOPY N/A 07/30/2014   [name] diverticulosis. Colonic polyp-removed TA. repeat TCS 07/2021  . ESOPHAGOGASTRODUODENOSCOPY N/A 07/30/2014   [id number] EGD  . RADIAL HEAD ARTHROPLASTY Left 06/23/2017   Procedure: RADIAL HEAD ARTHROPLASTY WITH LIGAMENT REPAIR;  Surgeon: Jean Pel, MD;  Location: [name];  Service: Orthopedics;  Laterality: Left;    Home Medications:  Allergies as of 12/11/2020   No Known Allergies     Medication List       Accurate as of December 11, 2020  3:02 PM. If you have any questions, ask your nurse or doctor.        acetaminophen 500 MG tablet Commonly known as: TYLENOL Take 1,000 mg by mouth 2 (two) times daily as  needed for mild pain or fever.   albuterol 108 (90 Base) MCG/ACT inhaler Commonly known as: VENTOLIN HFA Inhale 2 puffs into the lungs every 6 (six) hours as needed for wheezing or shortness of breath. For shortness of breath   albuterol (2.5 MG/3ML) 0.083% nebulizer solution Commonly known as: PROVENTIL Take 2.5 mg by nebulization every 6 (six) hours as needed for wheezing or shortness of breath.   aspirin EC 81 MG tablet Take 81 mg by mouth daily. Swallow whole.   atorvastatin 40 MG tablet Commonly known as: LIPITOR Take 40 mg by mouth at bedtime.   Cartia XT 180 MG 24 hr capsule Generic drug: diltiazem Take 180 mg by mouth 2 (two) times daily.   furosemide 40 MG tablet Commonly known as: LASIX Take 40 mg by mouth daily as needed for fluid or edema.   HYDROcodone-acetaminophen 10-325 MG tablet Commonly known as: NORCO Take 1 tablet by mouth as needed for moderate pain.   losartan-hydrochlorothiazide 100-25 MG tablet Commonly known as: HYZAAR Take 1 tablet by mouth daily.   lubiprostone 24 MCG capsule Commonly known as: AMITIZA TAKE 1 CAPSULE(24 MCG) BY MOUTH TWICE DAILY WITH A MEAL   meloxicam 7.5 MG tablet Commonly known as: MOBIC Take 7.5 mg by mouth 2 (two) times daily.   mirtazapine 7.5 MG tablet Commonly known as: REMERON Take 7.5 mg by mouth at bedtime as needed (sleep).   multivitamin  with minerals Tabs tablet Take 1 tablet by mouth daily.   naloxegol oxalate 25 MG Tabs tablet Commonly known as: Movantik Take 1 tablet (25 mg total) by mouth daily.   phenazopyridine 200 MG tablet Commonly known as: PYRIDIUM Take 200 mg by mouth 3 (three) times daily as needed.   potassium chloride SA 20 MEQ tablet Commonly known as: KLOR-CON Take 20 mEq by mouth daily as needed. Take with the furosemide.   sulfamethoxazole-trimethoprim 800-160 MG tablet Commonly known as: BACTRIM DS Take 1 tablet by mouth 2 (two) times daily.   tamsulosin 0.4 MG Caps  capsule Commonly known as: FLOMAX Take 1 capsule (0.4 mg total) by mouth daily after supper.   tamsulosin 0.4 MG Caps capsule Commonly known as: FLOMAX Take 1 capsule (0.4 mg total) by mouth 2 (two) times daily.   TRELEGY ELLIPTA IN Inhale 1 puff into the lungs daily.       Allergies: No Known Allergies  Family History: Family History  Adopted: Yes  Problem Relation Age of Onset  . Cancer Sister        pancreatic  . Migraines Daughter   . Cancer Daughter        pre cancerous cells on cervix  . Cancer Sister        breast cancer, colon  . Blindness Maternal Grandfather   . Other Brother        murdered  . Other Sister        ruptured colon  . Cancer Sister        breast, skin  . Other Brother        was in [place]  . Cancer Sister        pancreatic  . Hypertension Sister   . Other Sister        ruptured colon  . Colon cancer Neg Hx     Social History:  reports that she quit smoking about 7 years ago. Her smoking use included cigarettes. She quit after 40.00 years of use. She has never used smokeless tobacco. She reports current alcohol use. She reports that she does not use drugs.  ROS: All other review of systems were reviewed and are negative except what is noted above in HPI  Physical Exam: There were no vitals taken for this visit.  Constitutional:  Alert and oriented, No acute distress. HEENT: [place] AT, moist mucus membranes.  Trachea midline, no masses. Cardiovascular: No clubbing, cyanosis, or edema. Respiratory: Normal respiratory effort, no increased work of breathing. GI: Abdomen is soft, nontender, nondistended, no abdominal masses GU: No CVA tenderness.  Lymph: No cervical or inguinal lymphadenopathy. Skin: No rashes, bruises or suspicious lesions. Neurologic: Grossly intact, no focal deficits, moving all 4 extremities. Psychiatric: Normal mood and affect.  Laboratory Data: Lab Results  Component Value Date   WBC 12.9 (H) 06/24/2020   HGB 11.9 (L)  06/24/2020   HCT 39.2 06/24/2020   MCV 99.0 06/24/2020   PLT 214 06/24/2020    Lab Results  Component Value Date   CREATININE 0.79 06/24/2020    No results found for: PSA  No results found for: TESTOSTERONE  No results found for: HGBA1C  Urinalysis    Component Value Date/Time   COLORURINE YELLOW 06/24/2020 2120   APPEARANCEUR Clear 12/01/2020 1302   LABSPEC 1.012 06/24/2020 2120   PHURINE 6.0 06/24/2020 2120   GLUCOSEU Negative 12/01/2020 1302   HGBUR NEGATIVE 06/24/2020 2120   BILIRUBINUR Negative 12/01/2020 [place] 06/24/2020 2120  PROTEINUR Negative 12/01/2020 1302   PROTEINUR NEGATIVE 06/24/2020 2120   UROBILINOGEN 0.2 08/17/2011 1302   NITRITE Negative 12/01/2020 1302   NITRITE NEGATIVE 06/24/2020 2120   LEUKOCYTESUR Negative 12/01/2020 1302   LEUKOCYTESUR NEGATIVE 06/24/2020 2120    Lab Results  Component Value Date   LABMICR Comment 12/01/2020   WBCUA 11-30 (A) 01/25/2016   RBCUA 0-2 01/25/2016   LABEPIT 0-10 01/25/2016   MUCUS Present 01/25/2016   BACTERIA None seen 01/25/2016    Pertinent Imaging:  No results found for this or any previous visit.  No results found for this or any previous visit.  No results found for this or any previous visit.  No results found for this or any previous visit.  No results found for this or any previous visit.  No results found for this or any previous visit.  No results found for this or any previous visit.  Results for orders placed during the hospital encounter of 06/24/20  CT RENAL STONE STUDY  Narrative CLINICAL DATA:  Urinary retention and bilateral flank pain.  EXAM: CT ABDOMEN AND PELVIS WITHOUT CONTRAST  TECHNIQUE: Multidetector CT imaging of the abdomen and pelvis was performed following the standard protocol without IV contrast.  COMPARISON:  CT scan and 03/20/2013  FINDINGS: Lower chest: Stable bibasilar scarring changes and evidence of prior right lower lobe lung  surgery. The heart is normal in size. No pericardial effusion. Stable aortic calcifications.  Hepatobiliary: No hepatic lesions are identified without contrast. The gallbladder is grossly normal by CT. No common bile duct dilatation.  Pancreas: No mass, inflammation or ductal dilatation.  Spleen: Normal size. No focal lesions.  Adrenals/Urinary Tract: Stable left adrenal gland nodule measuring -3 Hounsfield units and consistent with benign adenoma. The right adrenal gland is normal.  No renal, ureteral or bladder calculi are identified. No worrisome renal lesions without contrast. The bladder is mildly distended but no bladder wall thickening, mass or calculi.  Stomach/Bowel: The stomach, duodenum, small bowel and colon are grossly normal without oral contrast. No inflammatory changes, mass lesions or obstructive findings. The appendix is normal.  Vascular/Lymphatic: Moderate to advanced atherosclerotic calcifications involving the aorta and branch vessels but no aneurysm. No mesenteric or retroperitoneal mass or adenopathy.  Reproductive: Surgically absent.  Other: No pelvic mass or adenopathy. No free pelvic fluid collections. No inguinal mass or adenopathy. No abdominal wall hernia or subcutaneous lesions.  Musculoskeletal: No significant bony findings. Moderate degenerative changes involving the lumbar spine.  IMPRESSION: 1. No acute abdominal/pelvic findings, mass lesions or adenopathy. 2. No renal, ureteral or bladder calculi or mass. 3. Stable left adrenal gland adenoma. 4. Aortic atherosclerosis.  Aortic Atherosclerosis (ICD10-I70.0).   Electronically Signed By: Marijo Sanes M.D. On: 06/25/2020 06:19   Assessment & Plan:    1. Urinary retention -continue flomax 0.4mg  BID. I will also refer her to pelvic floor PT due to her dysfunctional voiding and urinary hesitancy - BLADDER SCAN AMB NON-IMAGING   No follow-ups on file.  Nicolette Bang,  MD  [facility] [place]

## 2020-12-11 NOTE — Patient Instructions (Signed)
Acute Urinary Retention, Female  Acute urinary retention means that you cannot pee (urinate) at all, or that you pee too little and your bladder is not emptied completely. If it is not treated, it can lead to kidney damage or other serious problems. Follow these instructions at home:  Take over-the-counter and prescription medicines only as told by your doctor. Ask your doctor what medicines you should stay away from. Do not take any medicine unless your doctor says it is okay to do so.  If you were sent home with a tube that drains pee from the bladder (catheter), take care of it as told by your doctor.  Drink enough fluid to keep your pee clear or pale yellow.  If you were given an antibiotic, take it as told by your doctor. Do not stop taking the antibiotic even if you start to feel better.  Do not use any products that contain nicotine or tobacco, such as cigarettes and e-cigarettes. If you need help quitting, ask your doctor.  Watch for changes in your symptoms. Tell your doctor about them.  If told, keep track of any changes in your blood pressure at home. Tell your doctor about them.  Keep all follow-up visits as told by your doctor. This is important. Contact a doctor if:  You have spasms or you leak pee when you have spasms. Get help right away if:  You have chills or a fever.  You have blood in your pee.  You have a tube that drains the bladder and: ? The tube stops draining pee. ? The tube falls out. Summary  Acute urinary retention means that you cannot pee at all, or that you pee too little and your bladder is not emptied completely. If it is not treated, it can result in kidney damage or other serious problems.  If you were sent home with a tube that drains pee from the bladder, take care of it as told by your doctor.  Pay attention to any changes in your symptoms. Tell your doctor about them. This information is not intended to replace advice given to you by your  health care provider. Make sure you discuss any questions you have with your health care provider. Document Revised: 11/24/2017 Document Reviewed: 01/13/2017 Elsevier Patient Education  2020 [employer]  

## 2020-12-11 NOTE — Progress Notes (Signed)
Bladder Scan Patient can void: 37 ml Performed By: Foye Haggart,lpn Urological Symptom Review  Patient is experiencing the following symptoms: Trouble starting stream   Review of Systems  Gastrointestinal (upper)  : Negative for upper GI symptoms  Gastrointestinal (lower) : Negative for lower GI symptoms  Constitutional : Negative for symptoms  Skin: Negative for skin symptoms  Eyes: Negative for eye symptoms  Ear/Nose/Throat : Negative for Ear/Nose/Throat symptoms  Hematologic/Lymphatic: Negative for Hematologic/Lymphatic symptoms  Cardiovascular : Negative for cardiovascular symptoms  Respiratory : Negative for respiratory symptoms  Endocrine: Negative for endocrine symptoms  Musculoskeletal: Negative for musculoskeletal symptoms  Neurological: Negative for neurological symptoms  Psychologic: Negative for psychiatric symptoms

## 2020-12-14 ENCOUNTER — Telehealth: Payer: Self-pay | Admitting: Urology

## 2020-12-14 DIAGNOSIS — J9611 Chronic respiratory failure with hypoxia: Secondary | ICD-10-CM | POA: Diagnosis not present

## 2020-12-14 DIAGNOSIS — J449 Chronic obstructive pulmonary disease, unspecified: Secondary | ICD-10-CM | POA: Diagnosis not present

## 2020-12-15 DIAGNOSIS — Z79899 Other long term (current) drug therapy: Secondary | ICD-10-CM | POA: Diagnosis not present

## 2020-12-15 DIAGNOSIS — I10 Essential (primary) hypertension: Secondary | ICD-10-CM | POA: Diagnosis not present

## 2020-12-15 DIAGNOSIS — R5383 Other fatigue: Secondary | ICD-10-CM | POA: Diagnosis not present

## 2020-12-15 DIAGNOSIS — R739 Hyperglycemia, unspecified: Secondary | ICD-10-CM | POA: Diagnosis not present

## 2020-12-29 ENCOUNTER — Telehealth: Payer: Self-pay | Admitting: Internal Medicine

## 2020-12-29 NOTE — Telephone Encounter (Signed)
[phone]  Please call patient she thinks she has a mass in her rectum

## 2020-12-29 NOTE — Telephone Encounter (Signed)
Looks like this has been a recurrent intermittent problem per review of her chart. Looks like she has previously tried and failed and/or could not afford Linzess and Movantik. More recently, she has been on Amitiza 24 mcg twice daily which has been working fairly well for her with intermittent episodes of breakthrough constipation.   For now, recommend continuing Amitiza 24 mcg BID and adding MiraLAX 1 capful (17 g) in 8 ounces of water 1-2 times daily. Call with progress report in 1-2 weeks.  She should continue drinking plenty of fluids and eating normally. No restrictions necessary at this time.

## 2020-12-29 NOTE — Telephone Encounter (Signed)
Spoke with pt. Pt was notified of recommendations of [name], [place]. Pt has taken dulcolax this morning and is on her way to pick up her prescription Amitiza 24 mcg. Pt  is upset and states Miralax doesn't help her. Pt said she doesn't want her colon to explode. Pt was asked to pick her RX up today and take Amitiza as directed and Miralax as directed. Pt states she is going to call back in the morning if she doesn't have a BM because Miralax doesn't work for her.

## 2020-12-29 NOTE — Telephone Encounter (Signed)
Spoke with pt. Pt is taking Amitiza 24 mcg twice daily. Pt feels the medication has stopped working. Pt tries to have a BM q morning. Pt states that her BM is very small and she is having to strain to get it out. Pt asked if she should stop eating. Pt was advised to eat and drink fluids. Pt would like to know if she should try a different medication. Per pt she feels she's taken the Amitiza too long and it's stopped working. Pt has previously taken Linzess. Please advise in the absence of Dr. Jena Gauss.

## 2020-12-29 NOTE — Telephone Encounter (Signed)
Call patient this afternoon.  She is not having any significant abdominal pain, nausea, or vomiting.  She is passing small amounts of stool.  She feels Amitiza is not working for her.  States Linzess worked very well for her previously, but it was expensive. Then states she realizes all of these prescriptive medications are expensive and she is just biting the bullet and pain for the medication.  She would like to try Linzess again.  [name]: Please set samples of Linzess 145 mcg aside for patient to pick up tomorrow.  States her daughter [name] will take the medications up around 2 PM tomorrow afternoon.  She will let us know if medication works well, and we will send a prescription.  Also advised to let us know if she has any abdominal pain, nausea, or vomiting as we would need to do an x-ray to rule out obstruction.

## 2020-12-30 ENCOUNTER — Telehealth: Payer: Self-pay | Admitting: Internal Medicine

## 2020-12-30 NOTE — Telephone Encounter (Signed)
Noted. Samples of Linzess 145 mcg are ready for pickup

## 2020-12-30 NOTE — Telephone Encounter (Signed)
Already addressed by Helmut Muster

## 2020-12-30 NOTE — Telephone Encounter (Signed)
Patient called, please call her back.  Wants to have the xray done that was discussed with her    She is coming to pick up samples and would like to have the xray today if possible.

## 2020-12-31 ENCOUNTER — Other Ambulatory Visit: Payer: Self-pay | Admitting: Gastroenterology

## 2020-12-31 ENCOUNTER — Other Ambulatory Visit: Payer: Self-pay

## 2020-12-31 ENCOUNTER — Ambulatory Visit (HOSPITAL_COMMUNITY)
Admission: RE | Admit: 2020-12-31 | Discharge: 2020-12-31 | Disposition: A | Discharge status: Home / Self Care | Payer: Medicare Other | Source: Ambulatory Visit | Attending: Gastroenterology | Admitting: Gastroenterology

## 2020-12-31 DIAGNOSIS — K5900 Constipation, unspecified: Secondary | ICD-10-CM | POA: Diagnosis not present

## 2020-12-31 DIAGNOSIS — R109 Unspecified abdominal pain: Secondary | ICD-10-CM | POA: Diagnosis not present

## 2020-12-31 NOTE — Telephone Encounter (Signed)
I have placed an order for abdominal x-ray.  She should be able to walk in and have this completed.

## 2020-12-31 NOTE — Telephone Encounter (Signed)
Noted. Spoke with pt. Pt will have Xray completed today.

## 2020-12-31 NOTE — Telephone Encounter (Signed)
Pt called at 8:45 AM 12/31/2020. Pt states that she would like to have Xray to rule out obstruction. Pt states that she discussed when she picked up the Linzess samples yesterday 12/30/2020, that she wanted the Xray ordered then. Pt is aware that she advised to take Linzess and call back if she had any abdominal pain, nausea, vomiting and an Xray could be completed. Per pt, she wants the Xray ordered now due to only having a little bowel movement and medication not working.

## 2021-01-04 ENCOUNTER — Telehealth: Payer: Self-pay | Admitting: Internal Medicine

## 2021-01-04 NOTE — Telephone Encounter (Signed)
Spoke with pt. Pt feels she needs to increase to Linzess 290 mcg samples. Pt took samples of Linzess 145 mcg and had a BM but doesn't feel she emptied completely out. Pt says it was discussed with her that she can increase to the Linzess 290 mcg if Linzess 145 mcg didn't work well.

## 2021-01-04 NOTE — Telephone Encounter (Signed)
Please call patient when you can, she needs to speak to you

## 2021-01-04 NOTE — Telephone Encounter (Signed)
Left a detailed message for pt. Samples of Linzess 290 mcg are ready for pickup.

## 2021-01-04 NOTE — Telephone Encounter (Signed)
OK to trial Linzess 290 mcg daily. Please provide samples.

## 2021-01-13 ENCOUNTER — Other Ambulatory Visit: Payer: Self-pay | Admitting: Gastroenterology

## 2021-01-13 ENCOUNTER — Telehealth: Payer: Self-pay | Admitting: Internal Medicine

## 2021-01-13 DIAGNOSIS — R739 Hyperglycemia, unspecified: Secondary | ICD-10-CM | POA: Diagnosis not present

## 2021-01-13 DIAGNOSIS — R5383 Other fatigue: Secondary | ICD-10-CM | POA: Diagnosis not present

## 2021-01-13 DIAGNOSIS — K5900 Constipation, unspecified: Secondary | ICD-10-CM

## 2021-01-13 DIAGNOSIS — Z79899 Other long term (current) drug therapy: Secondary | ICD-10-CM | POA: Diagnosis not present

## 2021-01-13 DIAGNOSIS — I10 Essential (primary) hypertension: Secondary | ICD-10-CM | POA: Diagnosis not present

## 2021-01-13 MED ORDER — LINACLOTIDE 290 MCG PO CAPS: 290.0000 ug | ORAL_CAPSULE | Freq: Every day | ORAL | 5 refills | Status: DC

## 2021-01-13 NOTE — Telephone Encounter (Signed)
Noted. Spoke with pt. Pt is aware that RX was sent to her pharmacy.  

## 2021-01-13 NOTE — Telephone Encounter (Signed)
PATIENT CALLED AND SAID LINZESS WORKED AND AND WOULD LIKE A PRESCRIPTION SENT TO [facility], WOULD ALSO LIKE TO BE CALLED WHEN IT IS SENT IN

## 2021-01-13 NOTE — Telephone Encounter (Signed)
Routing to [employer], [place]. Pt called with a progress report. Linzess 290 mcg is working well. Pt would like RX sent into her pharmacy.

## 2021-01-13 NOTE — Telephone Encounter (Signed)
Please let patient know I have sent Rx for Linzess 290 mcg daily to her pharmacy.

## 2021-01-14 DIAGNOSIS — J9611 Chronic respiratory failure with hypoxia: Secondary | ICD-10-CM | POA: Diagnosis not present

## 2021-01-14 DIAGNOSIS — J449 Chronic obstructive pulmonary disease, unspecified: Secondary | ICD-10-CM | POA: Diagnosis not present

## 2021-01-25 ENCOUNTER — Ambulatory Visit: Payer: Medicare Other | Admitting: Physical Therapy

## 2021-01-28 ENCOUNTER — Ambulatory Visit: Payer: Medicare Other | Admitting: Urology

## 2021-01-29 ENCOUNTER — Other Ambulatory Visit: Payer: Self-pay

## 2021-01-29 ENCOUNTER — Ambulatory Visit (INDEPENDENT_AMBULATORY_CARE_PROVIDER_SITE_OTHER): Payer: Medicare Other | Admitting: Internal Medicine

## 2021-01-29 ENCOUNTER — Encounter: Payer: Self-pay | Admitting: Internal Medicine

## 2021-01-29 DIAGNOSIS — K5900 Constipation, unspecified: Secondary | ICD-10-CM | POA: Diagnosis not present

## 2021-01-29 DIAGNOSIS — Z86010 Personal history of colonic polyps: Secondary | ICD-10-CM | POA: Diagnosis not present

## 2021-01-29 NOTE — Patient Instructions (Signed)
Continue Linzess 290 for constipation  Add MiraLAX 17 g orally at bedtime  We will schedule a surveillance colonoscopy in the near future utilizing propofol.  ASA 3.  The risks, benefits, limitations, alternatives and imponderables have been reviewed with the patient. Questions have been answered. All parties are agreeable.   Patient understands that her bowel regimen may need to be adjusted further depending on her clinical response to Linzess and MiraLAX.  Further recommendations to follow

## 2021-01-29 NOTE — Progress Notes (Signed)
Referring Provider:  Primary Care Physician:  Asencion Noble, MD  Primary GI:   Patient Location: Home   Provider Location: [place] office   Reason for Visit:    Persons present on the virtual encounter, with roles:    Total time (minutes) spent on medical discussion: 14 minutes   Due to COVID-19, visit was conducted using virtual method.  Visit was requested by patient.  Virtual Visit via MyChart Video Note Due to COVID-19, visit is conducted virtually and was requested by patient.   I connected with [name] on 01/29/21 at 11:00 AM EST by telephone and verified that I am speaking with the correct person using two identifiers.   I discussed the limitations, risks, security and privacy concerns of performing an evaluation and management service by telephone and the availability of in person appointments. I also discussed with the patient that there may be a patient responsible charge related to this service. The patient expressed understanding and agreed to proceed.  Chief Complaint  Patient presents with  . change in bowels    Straining every time she has a BM, little faint blood. Wants to schedule TCS. Taking Linzess daily     History of Present Illness:   70 year old lady with multiple comorbidities including CHF and O2 dependent COPD having a follow-up today in regards to chronic constipation and history colonic adenoma; due for surveillance colonoscopy this year. Has been on multiple different regimens for constipation.  More recently,   Linzess 290 once daily.  She says with this regimen she has a bowel movement almost every day but she passes hard stools and has to strain;   has seen some occasional blood per rectum.  She has not had any associated abdominal pain. States her heart and lung issues are stable.  Has not seen Dr. Willey Blade recently.  Past Medical History:  Diagnosis Date  . Anxiety   . Atrophic vaginitis 10/09/2015  . Baker's cyst    left leg  . Blood in  urine 10/09/2015  . CHF (congestive heart failure) ([place])   . COPD (chronic obstructive pulmonary disease) ([place])   . Hematuria 10/09/2015  . Hypertension   . Nicotine addiction 04/07/2014  . Pneumonia   . Urinary urgency 01/25/2016  . Vaginal bleeding 10/09/2015     Past Surgical History:  Procedure Laterality Date  . ABDOMINAL HYSTERECTOMY    . CATARACT EXTRACTION Left 03/2015  . CESAREAN SECTION    . COLONOSCOPY  10/19/2004   Dr. Gala Romney- internal hemorrhoids, o/w normal rectum, normal colon  . COLONOSCOPY N/A 07/30/2014   [id number] diverticulosis. Colonic polyp-removed TA. repeat TCS 07/2021  . ESOPHAGOGASTRODUODENOSCOPY N/A 07/30/2014   [id number] EGD  . RADIAL HEAD ARTHROPLASTY Left 06/23/2017   Procedure: RADIAL HEAD ARTHROPLASTY WITH LIGAMENT REPAIR;  Surgeon: Meredith Pel, MD;  Location: [facility];  Service: Orthopedics;  Laterality: Left;     Current Meds  Medication Sig  . acetaminophen (TYLENOL) 500 MG tablet Take 1,000 mg by mouth 2 (two) times daily as needed for mild pain or fever.  Marland Kitchen albuterol (PROVENTIL HFA;VENTOLIN HFA) 108 (90 BASE) MCG/ACT inhaler Inhale 2 puffs into the lungs every 6 (six) hours as needed for wheezing or shortness of breath. For shortness of breath  . albuterol (PROVENTIL) (2.5 MG/3ML) 0.083% nebulizer solution Take 2.5 mg by nebulization every 6 (six) hours as needed for wheezing or shortness of breath.  Marland Kitchen aspirin EC 81 MG tablet Take 81 mg by mouth daily. Swallow whole.  Marland Kitchen  atorvastatin (LIPITOR) 40 MG tablet Take 40 mg by mouth at bedtime.  Marland Kitchen CARTIA XT 180 MG 24 hr capsule Take 180 mg by mouth 2 (two) times daily.   . Fluticasone-Umeclidin-Vilant (TRELEGY ELLIPTA IN) Inhale 1 puff into the lungs daily.   . furosemide (LASIX) 40 MG tablet Take 40 mg by mouth daily as needed for fluid or edema.   Marland Kitchen HYDROcodone-acetaminophen (NORCO) 10-325 MG tablet Take 1 tablet by mouth as needed for moderate pain.   Marland Kitchen linaclotide (LINZESS) 290 MCG CAPS capsule  Take 1 capsule (290 mcg total) by mouth daily before breakfast.  . losartan-hydrochlorothiazide (HYZAAR) 100-25 MG tablet Take 1 tablet by mouth daily.  . meloxicam (MOBIC) 7.5 MG tablet Take 7.5 mg by mouth 2 (two) times daily.  . mirtazapine (REMERON) 7.5 MG tablet Take 7.5 mg by mouth at bedtime as needed (sleep).   . Multiple Vitamin (MULTIVITAMIN WITH MINERALS) TABS Take 1 tablet by mouth daily.  . phenazopyridine (PYRIDIUM) 200 MG tablet Take 200 mg by mouth 3 (three) times daily as needed.  . potassium chloride SA (KLOR-CON) 20 MEQ tablet Take 20 mEq by mouth daily as needed. Take with the furosemide.  . tamsulosin (FLOMAX) 0.4 MG CAPS capsule Take 1 capsule (0.4 mg total) by mouth 2 (two) times daily.  . [DISCONTINUED] tamsulosin (FLOMAX) 0.4 MG CAPS capsule Take 1 capsule (0.4 mg total) by mouth daily after supper.     Family History  Adopted: Yes  Problem Relation Age of Onset  . Cancer Sister        pancreatic  . Migraines Daughter   . Cancer Daughter        pre cancerous cells on cervix  . Cancer Sister        breast cancer, colon  . Blindness Maternal Grandfather   . Other Brother        murdered  . Other Sister        ruptured colon  . Cancer Sister        breast, skin  . Other Brother        was in [place]  . Cancer Sister        pancreatic  . Hypertension Sister   . Other Sister        ruptured colon  . Colon cancer Neg Hx     Social History   Socioeconomic History  . Marital status: Single    Spouse name: Not on file  . Number of children: Not on file  . Years of education: Not on file  . Highest education level: Not on file  Occupational History  . Occupation: retired  Tobacco Use  . Smoking status: Former Smoker    Years: 40.00    Types: Cigarettes    Quit date: 04/14/2013    Years since quitting: 7.8  . Smokeless tobacco: Never Used  Substance and Sexual Activity  . Alcohol use: Yes    Comment: glass of wine maybe once a year  . Drug use: No   . Sexual activity: Never    Birth control/protection: Surgical    Comment: hyst  Other Topics Concern  . Not on file  Social History Narrative  . Not on file   Social Determinants of Health   Financial Resource Strain: Not on file  Food Insecurity: Not on file  Transportation Needs: Not on file  Physical Activity: Not on file  Stress: Not on file  Social Connections: Not on file  Review of Systems: As in history of present illness  Observations/Objective: No distress. Unable to perform physical exam due to telephone encounter. No video available.   Assessment and Plan: 70 year old lady with multiple comorbidities including congestive heart failure and O2 dependent COPD presents for follow-up of constipation and history of colonic adenoma.  Linzess has helped her constipation but she is still not optimally controlled.  Blood per rectum likely related to benign anorectal source.  She does have a history of a colonic adenoma and the plan all along was to perhaps do 1 more surveillance colonoscopy in 2022.   Follow Up Instructions:   Continue Linzess 290 for constipation  Add MiraLAX 17 g orally at bedtime  We will schedule a surveillance colonoscopy in the near future utilizing propofol.  ASA 3.  The risks, benefits, limitations, alternatives and imponderables have been reviewed with the patient. Questions have been answered. All parties are agreeable.   Patient understands that her bowel regimen may need to be adjusted further depending on her clinical response to Linzess and MiraLAX.  Further recommendations to follow       I discussed the assessment and treatment plan with the patient. The patient was provided an opportunity to ask questions and all were answered. The patient agreed with the plan and demonstrated an understanding of the instructions.   The patient was advised to call back or seek an in-person evaluation if the symptoms worsen or if the  condition fails to improve as anticipated.  I provided 14 minutes of face-to-face time during this MyChart Video encounter.  Bridgette Habermann MD [facility] Gastroenterology

## 2021-02-02 NOTE — Telephone Encounter (Signed)
Created in error

## 2021-02-10 DIAGNOSIS — Z79899 Other long term (current) drug therapy: Secondary | ICD-10-CM | POA: Diagnosis not present

## 2021-02-10 DIAGNOSIS — M2550 Pain in unspecified joint: Secondary | ICD-10-CM | POA: Diagnosis not present

## 2021-02-10 DIAGNOSIS — I10 Essential (primary) hypertension: Secondary | ICD-10-CM | POA: Diagnosis not present

## 2021-02-10 DIAGNOSIS — J449 Chronic obstructive pulmonary disease, unspecified: Secondary | ICD-10-CM | POA: Diagnosis not present

## 2021-02-14 DIAGNOSIS — J9611 Chronic respiratory failure with hypoxia: Secondary | ICD-10-CM | POA: Diagnosis not present

## 2021-02-14 DIAGNOSIS — J449 Chronic obstructive pulmonary disease, unspecified: Secondary | ICD-10-CM | POA: Diagnosis not present

## 2021-02-24 ENCOUNTER — Telehealth: Payer: Self-pay

## 2021-02-24 NOTE — Telephone Encounter (Signed)
Patient says she doesn't have any current symptoms.   She is taking her medications and doing well. Wants to change her visit to a virtual visit on 02-26-2021 @ 2 pm.  Thanks, Germain Osgood.

## 2021-02-24 NOTE — Telephone Encounter (Signed)
Pt is not having any symptoms so appt changed to a virtual.

## 2021-02-26 ENCOUNTER — Telehealth: Payer: Medicare Other | Admitting: Urology

## 2021-02-26 NOTE — Progress Notes (Deleted)
02/26/2021 3:13 PM   [name] 11-03-1951 [id number]  Referring provider: Asencion Noble, MD [address] [place],  [place] [address]   Patient location: Home Physician location: Office  followup dysfunctional voiding  HPI: [name] is a 70yo seen today via telehealth for dysfunctional voiding. She is on flomax 0.8mg  daily. She did not do pelvic floor PT since she improved here urinary frequency, urinary urgency and she feels she emptying better. She denies any pelvic pain.   I connected with  [name] on 02/26/21 by a video enabled telemedicine application and verified that I am speaking with the correct person using two identifiers.   I discussed the limitations of evaluation and management by telemedicine. The patient expressed understanding and agreed to proceed.    PMH: Past Medical History:  Diagnosis Date  . Anxiety   . Atrophic vaginitis 10/09/2015  . Baker's cyst    left leg  . Blood in urine 10/09/2015  . CHF (congestive heart failure) ([place])   . COPD (chronic obstructive pulmonary disease) ([place])   . Hematuria 10/09/2015  . Hypertension   . Nicotine addiction 04/07/2014  . Pneumonia   . Urinary urgency 01/25/2016  . Vaginal bleeding 10/09/2015    Surgical History: Past Surgical History:  Procedure Laterality Date  . ABDOMINAL HYSTERECTOMY    . CATARACT EXTRACTION Left 03/2015  . CESAREAN SECTION    . COLONOSCOPY  10/19/2004   Dr. Gala Romney- internal hemorrhoids, o/w normal rectum, normal colon  . COLONOSCOPY N/A 07/30/2014   [id number] diverticulosis. Colonic polyp-removed TA. repeat TCS 07/2021  . ESOPHAGOGASTRODUODENOSCOPY N/A 07/30/2014   [id number] EGD  . RADIAL HEAD ARTHROPLASTY Left 06/23/2017   Procedure: RADIAL HEAD ARTHROPLASTY WITH LIGAMENT REPAIR;  Surgeon: Meredith Pel, MD;  Location: [facility];  Service: Orthopedics;  Laterality: Left;    Home Medications:  Allergies as of 02/26/2021   No Known Allergies     Medication List        Accurate as of February 26, 2021  3:13 PM. If you have any questions, ask your nurse or doctor.        acetaminophen 500 MG tablet Commonly known as: TYLENOL Take 1,000 mg by mouth 2 (two) times daily as needed for mild pain or fever.   albuterol 108 (90 Base) MCG/ACT inhaler Commonly known as: VENTOLIN HFA Inhale 2 puffs into the lungs every 6 (six) hours as needed for wheezing or shortness of breath. For shortness of breath   albuterol (2.5 MG/3ML) 0.083% nebulizer solution Commonly known as: PROVENTIL Take 2.5 mg by nebulization every 6 (six) hours as needed for wheezing or shortness of breath.   aspirin EC 81 MG tablet Take 81 mg by mouth daily. Swallow whole.   atorvastatin 40 MG tablet Commonly known as: LIPITOR Take 40 mg by mouth at bedtime.   Cartia XT 180 MG 24 hr capsule Generic drug: diltiazem Take 180 mg by mouth 2 (two) times daily.   furosemide 40 MG tablet Commonly known as: LASIX Take 40 mg by mouth daily as needed for fluid or edema.   HYDROcodone-acetaminophen 10-325 MG tablet Commonly known as: NORCO Take 1 tablet by mouth as needed for moderate pain.   linaclotide 290 MCG Caps capsule Commonly known as: Linzess Take 1 capsule (290 mcg total) by mouth daily before breakfast.   losartan-hydrochlorothiazide 100-25 MG tablet Commonly known as: HYZAAR Take 1 tablet by mouth daily.   meloxicam 7.5 MG tablet Commonly known as: MOBIC Take 7.5  mg by mouth 2 (two) times daily.   mirtazapine 7.5 MG tablet Commonly known as: REMERON Take 7.5 mg by mouth at bedtime as needed (sleep).   multivitamin with minerals Tabs tablet Take 1 tablet by mouth daily.   phenazopyridine 200 MG tablet Commonly known as: PYRIDIUM Take 200 mg by mouth 3 (three) times daily as needed.   potassium chloride SA 20 MEQ tablet Commonly known as: KLOR-CON Take 20 mEq by mouth daily as needed. Take with the furosemide.   tamsulosin 0.4 MG Caps capsule Commonly known  as: FLOMAX Take 1 capsule (0.4 mg total) by mouth 2 (two) times daily.   TRELEGY ELLIPTA IN Inhale 1 puff into the lungs daily.       Allergies: No Known Allergies  Family History: Family History  Adopted: Yes  Problem Relation Age of Onset  . Cancer Sister        pancreatic  . Migraines Daughter   . Cancer Daughter        pre cancerous cells on cervix  . Cancer Sister        breast cancer, colon  . Blindness Maternal Grandfather   . Other Brother        murdered  . Other Sister        ruptured colon  . Cancer Sister        breast, skin  . Other Brother        was in [place]  . Cancer Sister        pancreatic  . Hypertension Sister   . Other Sister        ruptured colon  . Colon cancer Neg Hx     Social History:  reports that she quit smoking about 7 years ago. Her smoking use included cigarettes. She quit after 40.00 years of use. She has never used smokeless tobacco. She reports current alcohol use. She reports that she does not use drugs.  ROS: All other review of systems were reviewed and are negative except what is noted above in HPI  Physical Exam: There were no vitals taken for this visit.  Constitutional:  Alert and oriented, No acute distress. HEENT: [place] AT, moist mucus membranes.  Trachea midline, no masses. Cardiovascular: No clubbing, cyanosis, or edema. Respiratory: Normal respiratory effort, no increased work of breathing. GI: Abdomen is soft, nontender, nondistended, no abdominal masses GU: No CVA tenderness.  Lymph: No cervical or inguinal lymphadenopathy. Skin: No rashes, bruises or suspicious lesions. Neurologic: Grossly intact, no focal deficits, moving all 4 extremities. Psychiatric: Normal mood and affect.  Laboratory Data: Lab Results  Component Value Date   WBC 12.9 (H) 06/24/2020   HGB 11.9 (L) 06/24/2020   HCT 39.2 06/24/2020   MCV 99.0 06/24/2020   PLT 214 06/24/2020    Lab Results  Component Value Date   CREATININE 0.79  06/24/2020    No results found for: PSA  No results found for: TESTOSTERONE  No results found for: HGBA1C  Urinalysis    Component Value Date/Time   COLORURINE YELLOW 06/24/2020 2120   APPEARANCEUR Clear 12/11/2020 1522   LABSPEC 1.012 06/24/2020 2120   PHURINE 6.0 06/24/2020 2120   GLUCOSEU Negative 12/11/2020 1522   HGBUR NEGATIVE 06/24/2020 2120   BILIRUBINUR Negative 12/11/2020 [place] 06/24/2020 2120   PROTEINUR Negative 12/11/2020 [place] 06/24/2020 2120   UROBILINOGEN 0.2 08/17/2011 1302   NITRITE Negative 12/11/2020 1522   NITRITE NEGATIVE 06/24/2020 2120   LEUKOCYTESUR  Trace (A) 12/11/2020 1522   LEUKOCYTESUR NEGATIVE 06/24/2020 2120    Lab Results  Component Value Date   LABMICR See below: 12/11/2020   WBCUA 0-5 12/11/2020   RBCUA 0-2 01/25/2016   LABEPIT 0-10 12/11/2020   MUCUS Present 01/25/2016   BACTERIA None seen 12/11/2020    Pertinent Imaging: *** No results found for this or any previous visit.  No results found for this or any previous visit.  No results found for this or any previous visit.  No results found for this or any previous visit.  No results found for this or any previous visit.  No results found for this or any previous visit.  No results found for this or any previous visit.  Results for orders placed during the hospital encounter of 06/24/20  CT RENAL STONE STUDY  Narrative CLINICAL DATA:  Urinary retention and bilateral flank pain.  EXAM: CT ABDOMEN AND PELVIS WITHOUT CONTRAST  TECHNIQUE: Multidetector CT imaging of the abdomen and pelvis was performed following the standard protocol without IV contrast.  COMPARISON:  CT scan and 03/20/2013  FINDINGS: Lower chest: Stable bibasilar scarring changes and evidence of prior right lower lobe lung surgery. The heart is normal in size. No pericardial effusion. Stable aortic calcifications.  Hepatobiliary: No hepatic lesions are  identified without contrast. The gallbladder is grossly normal by CT. No common bile duct dilatation.  Pancreas: No mass, inflammation or ductal dilatation.  Spleen: Normal size. No focal lesions.  Adrenals/Urinary Tract: Stable left adrenal gland nodule measuring -3 Hounsfield units and consistent with benign adenoma. The right adrenal gland is normal.  No renal, ureteral or bladder calculi are identified. No worrisome renal lesions without contrast. The bladder is mildly distended but no bladder wall thickening, mass or calculi.  Stomach/Bowel: The stomach, duodenum, small bowel and colon are grossly normal without oral contrast. No inflammatory changes, mass lesions or obstructive findings. The appendix is normal.  Vascular/Lymphatic: Moderate to advanced atherosclerotic calcifications involving the aorta and branch vessels but no aneurysm. No mesenteric or retroperitoneal mass or adenopathy.  Reproductive: Surgically absent.  Other: No pelvic mass or adenopathy. No free pelvic fluid collections. No inguinal mass or adenopathy. No abdominal wall hernia or subcutaneous lesions.  Musculoskeletal: No significant bony findings. Moderate degenerative changes involving the lumbar spine.  IMPRESSION: 1. No acute abdominal/pelvic findings, mass lesions or adenopathy. 2. No renal, ureteral or bladder calculi or mass. 3. Stable left adrenal gland adenoma. 4. Aortic atherosclerosis.  Aortic Atherosclerosis (ICD10-I70.0).   Electronically Signed By: Marijo Sanes M.D. On: 06/25/2020 06:19   Assessment & Plan:    There are no diagnoses linked to this encounter.  No follow-ups on file.  Nicolette Bang, MD  [facility] [place]

## 2021-03-01 ENCOUNTER — Telehealth: Payer: Self-pay

## 2021-03-01 DIAGNOSIS — R339 Retention of urine, unspecified: Secondary | ICD-10-CM

## 2021-03-01 NOTE — Telephone Encounter (Signed)
Noted  

## 2021-03-01 NOTE — Telephone Encounter (Signed)
Noted,   Coralyn Helling and Elmo Putt, please dont forget to give the schedulers a copy of the patients paperwork when they have a virtual visit so things dont get missed. Thank you!   Tretha Sciara, have you scheduled tcs?   Routing to Dr.Rourk for extra prep recommendations for good prep,since miralax doesn't seem to be helping at this time.

## 2021-03-01 NOTE — Telephone Encounter (Signed)
I'm waiting to see if Dr. Gala Romney wants to do any extra with prep. Pt wanted me to check with him before scheduling.

## 2021-03-01 NOTE — Telephone Encounter (Signed)
Pt called office because she hadn't been contacted to schedule TCS. She had virtual visit 01/29/21. Schedulers unaware she needed TCS. She said Dr. Gala Romney mentioned giving her something extra to help her to be cleaned out before colonoscopy. She is taking Linzess 290mg  daily and added Miralax at bedtime. She can't tell any difference since adding the Miralax.

## 2021-03-01 NOTE — Telephone Encounter (Signed)
Pt called saying she has started dribbling when she urinates has been doing this for 3 days now. Doesn't feel she is completely emptying. Pt will come tomorrow and leave urine specimen. Appointment made and orders placed.

## 2021-03-02 ENCOUNTER — Ambulatory Visit (INDEPENDENT_AMBULATORY_CARE_PROVIDER_SITE_OTHER): Payer: Medicare Other

## 2021-03-02 ENCOUNTER — Other Ambulatory Visit: Payer: Self-pay

## 2021-03-02 DIAGNOSIS — R339 Retention of urine, unspecified: Secondary | ICD-10-CM | POA: Diagnosis not present

## 2021-03-02 LAB — URINALYSIS, ROUTINE W REFLEX MICROSCOPIC
Bilirubin, UA: NEGATIVE
Glucose, UA: NEGATIVE
Nitrite, UA: NEGATIVE
RBC, UA: NEGATIVE
Specific Gravity, UA: >1.03 — ABNORMAL HIGH (ref 1.005–1.030)
Urobilinogen, Ur: 0.2 mg/dL (ref 0.2–1.0)
pH, UA: 5.5 (ref 5.0–7.5)

## 2021-03-02 MED ORDER — PEG 3350-KCL-NA BICARB-NACL 420 G PO SOLR: 4000.0000 mL | ORAL | 0 refills | Status: DC

## 2021-03-02 NOTE — Addendum Note (Signed)
Addended by: Dorisann Frames on: 03/02/2021 01:22 PM   Modules accepted: Orders

## 2021-03-02 NOTE — Addendum Note (Signed)
Addended by: Valentina Lucks on: 03/02/2021 04:28 PM   Modules accepted: Orders

## 2021-03-02 NOTE — Telephone Encounter (Signed)
Called pt, informed her of Dr. Roseanne Kaufman recommendations. TCS scheduled for 03/29/21--PM. Orders entered. Rx for prep sent to pharmacy.

## 2021-03-02 NOTE — Progress Notes (Signed)
Patient here today for PVR after reporting yesterday to nurse call she was having difficulty voiding.  PVR today shows 50cc. Pt voided a small amount of urine in cup as well.  Patient states, " I don't pee but every two hours" I reassured patient her voiding was okay. I encouraged patient she should drink plenty of fluids as well. Patient reports she did not drink much today because coming to this appointment.  Patient requesting urine to be sent off for ua and culture. Urine specimen received only enough for urine culture.

## 2021-03-02 NOTE — Telephone Encounter (Signed)
Patient should continue Linzess 290 and MiraLAX 17 g orally daily up until the day of colonoscopy clear liquids day and a half before procedure.  Lets go with 4 L of GoLYTELY the afternoon prior to the procedure (8 ounces over 15 minutes x 4 hours).  2 tapwater enemas morning of procedure.  We will hope for a good prep

## 2021-03-02 NOTE — Telephone Encounter (Signed)
NOTED

## 2021-03-03 NOTE — Telephone Encounter (Signed)
Pre-op/covid test 03/25/21 at 9:45am. Appt letter mailed with procedure instructions.

## 2021-03-04 LAB — URINE CULTURE

## 2021-03-04 NOTE — Progress Notes (Signed)
Sent via mail 

## 2021-03-10 DIAGNOSIS — J449 Chronic obstructive pulmonary disease, unspecified: Secondary | ICD-10-CM | POA: Diagnosis not present

## 2021-03-10 DIAGNOSIS — R0602 Shortness of breath: Secondary | ICD-10-CM | POA: Diagnosis not present

## 2021-03-10 DIAGNOSIS — Z79899 Other long term (current) drug therapy: Secondary | ICD-10-CM | POA: Diagnosis not present

## 2021-03-12 ENCOUNTER — Telehealth: Payer: Self-pay | Admitting: Urology

## 2021-03-12 NOTE — Telephone Encounter (Signed)
Patient received her UA & culture results.  She would like to have a nurse call her to go over the results with her.

## 2021-03-14 DIAGNOSIS — J449 Chronic obstructive pulmonary disease, unspecified: Secondary | ICD-10-CM | POA: Diagnosis not present

## 2021-03-14 DIAGNOSIS — J9611 Chronic respiratory failure with hypoxia: Secondary | ICD-10-CM | POA: Diagnosis not present

## 2021-03-24 NOTE — Patient Instructions (Signed)
Your procedure is scheduled on: 03/29/2021  Report to [facility] at   10:45  AM.  Call this number if you have problems the morning of surgery: [phone]   Remember:              Follow Directions on the letter you received from Your Physician's office regarding the Bowel Prep              No Smoking the day of Procedure :   Take these medicines the morning of surgery with A SIP OF WATER: Cartia 180 mg    Use inhalers if needed   Do not wear jewelry, make-up or nail polish.    Do not bring valuables to the hospital.  Contacts, dentures or bridgework may not be worn into surgery.  .   Patients discharged the day of surgery will not be allowed to drive home.     Colonoscopy, Adult, Care After This sheet gives you information about how to care for yourself after your procedure. Your health care provider may also give you more specific instructions. If you have problems or questions, contact your health care provider. What can I expect after the procedure? After the procedure, it is common to have:  A small amount of blood in your stool for 24 hours after the procedure.  Some gas.  Mild abdominal cramping or bloating.  Follow these instructions at home: General instructions   For the first 24 hours after the procedure: ? Do not drive or use machinery. ? Do not sign important documents. ? Do not drink alcohol. ? Do your regular daily activities at a slower pace than normal. ? Eat soft, easy-to-digest foods. ? Rest often.  Take over-the-counter or prescription medicines only as told by your health care provider.  It is up to you to get the results of your procedure. Ask your health care provider, or the department performing the procedure, when your results will be ready. Relieving cramping and bloating  Try walking around when you have cramps or feel bloated.  Apply heat to your abdomen as told by your health care provider. Use a heat source that your health care  provider recommends, such as a moist heat pack or a heating pad. ? Place a towel between your skin and the heat source. ? Leave the heat on for 20-30 minutes. ? Remove the heat if your skin turns bright red. This is especially important if you are unable to feel pain, heat, or cold. You may have a greater risk of getting burned. Eating and drinking  Drink enough fluid to keep your urine clear or pale yellow.  Resume your normal diet as instructed by your health care provider. Avoid heavy or fried foods that are hard to digest.  Avoid drinking alcohol for as long as instructed by your health care provider. Contact a health care provider if:  You have blood in your stool 2-3 days after the procedure. Get help right away if:  You have more than a small spotting of blood in your stool.  You pass large blood clots in your stool.  Your abdomen is swollen.  You have nausea or vomiting.  You have a fever.  You have increasing abdominal pain that is not relieved with medicine. This information is not intended to replace advice given to you by your health care provider. Make sure you discuss any questions you have with your health care provider. Document Released: 07/26/2004 Document Revised: 09/05/2016 Document Reviewed: 02/23/2016  Chartered certified accountant Patient Education  [employer].

## 2021-03-25 ENCOUNTER — Other Ambulatory Visit (HOSPITAL_COMMUNITY)
Admission: RE | Admit: 2021-03-25 | Discharge: 2021-03-25 | Disposition: A | Discharge status: Home / Self Care | Payer: Medicare Other | Source: Ambulatory Visit | Attending: Internal Medicine | Admitting: Internal Medicine

## 2021-03-25 ENCOUNTER — Other Ambulatory Visit: Payer: Self-pay

## 2021-03-25 ENCOUNTER — Encounter (HOSPITAL_COMMUNITY): Payer: Self-pay

## 2021-03-25 ENCOUNTER — Encounter (HOSPITAL_COMMUNITY)
Admission: RE | Admit: 2021-03-25 | Discharge: 2021-03-25 | Disposition: A | Discharge status: Home / Self Care | Payer: Medicare Other | Source: Ambulatory Visit | Attending: Internal Medicine | Admitting: Internal Medicine

## 2021-03-25 DIAGNOSIS — Z20822 Contact with and (suspected) exposure to covid-19: Secondary | ICD-10-CM | POA: Insufficient documentation

## 2021-03-25 DIAGNOSIS — Z01812 Encounter for preprocedural laboratory examination: Secondary | ICD-10-CM | POA: Diagnosis not present

## 2021-03-25 HISTORY — DX: Cardiac murmur, unspecified: R01.1

## 2021-03-25 LAB — BASIC METABOLIC PANEL
Anion gap: 10 (ref 5–15)
BUN: 17 mg/dL (ref 8–23)
CO2: 43 mmol/L — ABNORMAL HIGH (ref 22–32)
Calcium: 9 mg/dL (ref 8.9–10.3)
Chloride: 84 mmol/L — ABNORMAL LOW (ref 98–111)
Creatinine, Ser: 0.45 mg/dL (ref 0.44–1.00)
GFR, Estimated: >60 mL/min (ref >60)
Glucose, Bld: 106 mg/dL — ABNORMAL HIGH (ref 70–99)
Potassium: 3.8 mmol/L (ref 3.5–5.1)
Sodium: 137 mmol/L (ref 135–145)

## 2021-03-25 LAB — SARS CORONAVIRUS 2 (TAT 6-24 HRS): SARS Coronavirus 2: NEGATIVE

## 2021-03-29 ENCOUNTER — Ambulatory Visit (HOSPITAL_COMMUNITY): Payer: Medicare Other | Admitting: Anesthesiology

## 2021-03-29 ENCOUNTER — Encounter (HOSPITAL_COMMUNITY): Payer: Self-pay | Admitting: Internal Medicine

## 2021-03-29 ENCOUNTER — Encounter (HOSPITAL_COMMUNITY)
Admission: RE | Disposition: A | Discharge status: Home / Self Care | Payer: Self-pay | Source: Home / Self Care | Attending: Internal Medicine

## 2021-03-29 ENCOUNTER — Ambulatory Visit (HOSPITAL_COMMUNITY)
Admission: RE | Admit: 2021-03-29 | Discharge: 2021-03-29 | Disposition: A | Discharge status: Home / Self Care | Payer: Medicare Other | Attending: Internal Medicine | Admitting: Internal Medicine

## 2021-03-29 DIAGNOSIS — Z1211 Encounter for screening for malignant neoplasm of colon: Secondary | ICD-10-CM | POA: Diagnosis not present

## 2021-03-29 DIAGNOSIS — K635 Polyp of colon: Secondary | ICD-10-CM | POA: Diagnosis not present

## 2021-03-29 DIAGNOSIS — I110 Hypertensive heart disease with heart failure: Secondary | ICD-10-CM | POA: Diagnosis not present

## 2021-03-29 DIAGNOSIS — K5730 Diverticulosis of large intestine without perforation or abscess without bleeding: Secondary | ICD-10-CM | POA: Insufficient documentation

## 2021-03-29 DIAGNOSIS — Z808 Family history of malignant neoplasm of other organs or systems: Secondary | ICD-10-CM | POA: Diagnosis not present

## 2021-03-29 DIAGNOSIS — Z79899 Other long term (current) drug therapy: Secondary | ICD-10-CM | POA: Diagnosis not present

## 2021-03-29 DIAGNOSIS — Z90710 Acquired absence of both cervix and uterus: Secondary | ICD-10-CM | POA: Diagnosis not present

## 2021-03-29 DIAGNOSIS — Z8719 Personal history of other diseases of the digestive system: Secondary | ICD-10-CM | POA: Diagnosis not present

## 2021-03-29 DIAGNOSIS — Z7982 Long term (current) use of aspirin: Secondary | ICD-10-CM | POA: Insufficient documentation

## 2021-03-29 DIAGNOSIS — J449 Chronic obstructive pulmonary disease, unspecified: Secondary | ICD-10-CM | POA: Diagnosis not present

## 2021-03-29 DIAGNOSIS — Z8249 Family history of ischemic heart disease and other diseases of the circulatory system: Secondary | ICD-10-CM | POA: Insufficient documentation

## 2021-03-29 DIAGNOSIS — I509 Heart failure, unspecified: Secondary | ICD-10-CM | POA: Insufficient documentation

## 2021-03-29 DIAGNOSIS — Z8049 Family history of malignant neoplasm of other genital organs: Secondary | ICD-10-CM | POA: Diagnosis not present

## 2021-03-29 DIAGNOSIS — Z803 Family history of malignant neoplasm of breast: Secondary | ICD-10-CM | POA: Insufficient documentation

## 2021-03-29 DIAGNOSIS — Z86010 Personal history of colonic polyps: Secondary | ICD-10-CM | POA: Diagnosis not present

## 2021-03-29 DIAGNOSIS — Z800 Family history of malignant neoplasm of digestive organs: Secondary | ICD-10-CM | POA: Diagnosis not present

## 2021-03-29 DIAGNOSIS — Z87891 Personal history of nicotine dependence: Secondary | ICD-10-CM | POA: Diagnosis not present

## 2021-03-29 DIAGNOSIS — K641 Second degree hemorrhoids: Secondary | ICD-10-CM | POA: Insufficient documentation

## 2021-03-29 DIAGNOSIS — Z791 Long term (current) use of non-steroidal anti-inflammatories (NSAID): Secondary | ICD-10-CM | POA: Diagnosis not present

## 2021-03-29 DIAGNOSIS — D122 Benign neoplasm of ascending colon: Secondary | ICD-10-CM | POA: Insufficient documentation

## 2021-03-29 HISTORY — PX: COLONOSCOPY WITH PROPOFOL: SHX5780

## 2021-03-29 HISTORY — PX: POLYPECTOMY: SHX5525

## 2021-03-29 SURGERY — COLONOSCOPY WITH PROPOFOL
Anesthesia: General

## 2021-03-29 MED ORDER — PROPOFOL 500 MG/50ML IV EMUL
INTRAVENOUS | Status: DC | PRN
  Administered 2021-03-29: 100 ug/kg/min via INTRAVENOUS

## 2021-03-29 MED ORDER — LACTATED RINGERS IV SOLN: INTRAVENOUS | Status: DC

## 2021-03-29 MED ORDER — PROPOFOL 10 MG/ML IV BOLUS
INTRAVENOUS | Status: DC | PRN
  Administered 2021-03-29 (×2): 50 mg via INTRAVENOUS

## 2021-03-29 MED ORDER — STERILE WATER FOR IRRIGATION IR SOLN
Status: DC | PRN
  Administered 2021-03-29: 100 mL

## 2021-03-29 MED ORDER — MIDAZOLAM HCL 2 MG/2ML IJ SOLN
1.0000 mg | INTRAMUSCULAR | Status: AC | PRN
  Administered 2021-03-29 (×2): 1 mg via INTRAVENOUS
  Filled 2021-03-29: qty 2

## 2021-03-29 NOTE — Discharge Instructions (Signed)
Colonoscopy Discharge Instructions  Read the instructions outlined below and refer to this sheet in the next few weeks. These discharge instructions provide you with general information on caring for yourself after you leave the hospital. Your doctor may also give you specific instructions. While your treatment has been planned according to the most current medical practices available, unavoidable complications occasionally occur. If you have any problems or questions after discharge, call Dr. Gala Romney at [phone]. ACTIVITY  You may resume your regular activity, but move at a slower pace for the next 24 hours.   Take frequent rest periods for the next 24 hours.   Walking will help get rid of the air and reduce the bloated feeling in your belly (abdomen).   No driving for 24 hours (because of the medicine (anesthesia) used during the test).    Do not sign any important legal documents or operate any machinery for 24 hours (because of the anesthesia used during the test).  NUTRITION  Drink plenty of fluids.   You may resume your normal diet as instructed by your doctor.   Begin with a light meal and progress to your normal diet. Heavy or fried foods are harder to digest and may make you feel sick to your stomach (nauseated).   Avoid alcoholic beverages for 24 hours or as instructed.  MEDICATIONS  You may resume your normal medications unless your doctor tells you otherwise.  WHAT YOU CAN EXPECT TODAY  Some feelings of bloating in the abdomen.   Passage of more gas than usual.   Spotting of blood in your stool or on the toilet paper.  IF YOU HAD POLYPS REMOVED DURING THE COLONOSCOPY:  No aspirin products for 7 days or as instructed.   No alcohol for 7 days or as instructed.   Eat a soft diet for the next 24 hours.  FINDING OUT THE RESULTS OF YOUR TEST Not all test results are available during your visit. If your test results are not back during the visit, make an appointment  with your caregiver to find out the results. Do not assume everything is normal if you have not heard from your caregiver or the medical facility. It is important for you to follow up on all of your test results.  SEEK IMMEDIATE MEDICAL ATTENTION IF:  You have more than a spotting of blood in your stool.   Your belly is swollen (abdominal distention).   You are nauseated or vomiting.   You have a temperature over 101.   You have abdominal pain or discomfort that is severe or gets worse throughout the day.    1 polyp removed from your colon today  Hemorrhoid, diverticulosis and colon polyp information provided  Office visit with [place] in [place].  Pamphlet on hemorrhoid banding.  Diverticulosis  Diverticulosis is a condition that develops when small pouches (diverticula) form in the wall of the large intestine (colon). The colon is where water is absorbed and stool (feces) is formed. The pouches form when the inside layer of the colon pushes through weak spots in the outer layers of the colon. You may have a few pouches or many of them. The pouches usually do not cause problems unless they become inflamed or infected. When this happens, the condition is called diverticulitis. What are the causes? The cause of this condition is not known. What increases the risk? The following factors may make you more likely to develop this condition:  Being older than age 62. Your  risk for this condition increases with age. Diverticulosis is rare among people younger than age 49. By age 20, many people have it.  Eating a low-fiber diet.  Having frequent constipation.  Being overweight.  Not getting enough exercise.  Smoking.  Taking over-the-counter pain medicines, like aspirin and ibuprofen.  Having a family history of diverticulosis. What are the signs or symptoms? In most people, there are no symptoms of this condition. If you do have symptoms, they may  include:  Bloating.  Cramps in the abdomen.  Constipation or diarrhea.  Pain in the lower left side of the abdomen. How is this diagnosed? Because diverticulosis usually has no symptoms, it is most often diagnosed during an exam for other colon problems. The condition may be diagnosed by:  Using a flexible scope to examine the colon (colonoscopy).  Taking an X-ray of the colon after dye has been put into the colon (barium enema).  Having a CT scan. How is this treated? You may not need treatment for this condition. Your health care provider may recommend treatment to prevent problems. You may need treatment if you have symptoms or if you previously had diverticulitis. Treatment may include:  Eating a high-fiber diet.  Taking a fiber supplement.  Taking a live bacteria supplement (probiotic).  Taking medicine to relax your colon.   Follow these instructions at home: Medicines  Take over-the-counter and prescription medicines only as told by your health care provider.  If told by your health care provider, take a fiber supplement or probiotic. Constipation prevention Your condition may cause constipation. To prevent or treat constipation, you may need to:  Drink enough fluid to keep your urine pale yellow.  Take over-the-counter or prescription medicines.  Eat foods that are high in fiber, such as beans, whole grains, and fresh fruits and vegetables.  Limit foods that are high in fat and processed sugars, such as fried or sweet foods.   General instructions  Try not to strain when you have a bowel movement.  Keep all follow-up visits as told by your health care provider. This is important. Contact a health care provider if you:  Have pain in your abdomen.  Have bloating.  Have cramps.  Have not had a bowel movement in 3 days. Get help right away if:  Your pain gets worse.  Your bloating becomes very bad.  You have a fever or chills, and your symptoms  suddenly get worse.  You vomit.  You have bowel movements that are bloody or black.  You have bleeding from your rectum. Summary  Diverticulosis is a condition that develops when small pouches (diverticula) form in the wall of the large intestine (colon).  You may have a few pouches or many of them.  This condition is most often diagnosed during an exam for other colon problems.  Treatment may include increasing the fiber in your diet, taking supplements, or taking medicines. This information is not intended to replace advice given to you by your health care provider. Make sure you discuss any questions you have with your health care provider. Document Revised: 07/11/2019 Document Reviewed: 07/11/2019 Elsevier Patient Education  [employer].   Further recommendations to follow pending review of pathology report  At patient request I called [facility] [phone]  -unable to reach

## 2021-03-29 NOTE — Transfer of Care (Signed)
Immediate Anesthesia Transfer of Care Note  Patient: [name]  Procedure(s) Performed: COLONOSCOPY WITH PROPOFOL (N/A ) POLYPECTOMY  Patient Location: Short Stay  Anesthesia Type:General  Level of Consciousness: awake and patient cooperative  Airway & Oxygen Therapy: Patient Spontanous Breathing  Post-op Assessment: Report given to RN and Post -op Vital signs reviewed and stable  Post vital signs: Reviewed and stable  Last Vitals:  Vitals Value Taken Time  BP    Temp    Pulse    Resp    SpO2     SEE PACU FLOW SHEET Last Pain:  Vitals:   03/29/21 1030  PainSc: 0-No pain         Complications: No complications documented.

## 2021-03-29 NOTE — H&P (Signed)
@LOGO @   Primary Care Physician:  Asencion Noble, MD Primary Gastroenterologist:  Dr. Gala Romney  Pre-Procedure History & Physical: HPI:  [name] is a 70 y.o. female here for surveillance colonoscopy.  No bowel symptoms aside from chronic constipation managed with Linzess and MiraLAX.  History of colonic adenoma  Past Medical History:  Diagnosis Date  . Anxiety   . Atrophic vaginitis 10/09/2015  . Baker's cyst    left leg  . Blood in urine 10/09/2015  . CHF (congestive heart failure) ([place])   . COPD (chronic obstructive pulmonary disease) ([place])   . Heart murmur   . Hematuria 10/09/2015  . Hypertension   . Nicotine addiction 04/07/2014  . Pneumonia   . Urinary urgency 01/25/2016  . Vaginal bleeding 10/09/2015    Past Surgical History:  Procedure Laterality Date  . ABDOMINAL HYSTERECTOMY    . CATARACT EXTRACTION Left 03/2015  . CESAREAN SECTION    . COLONOSCOPY  10/19/2004   Dr. Gala Romney- internal hemorrhoids, o/w normal rectum, normal colon  . COLONOSCOPY N/A 07/30/2014   [id number] diverticulosis. Colonic polyp-removed TA. repeat TCS 07/2021  . ESOPHAGOGASTRODUODENOSCOPY N/A 07/30/2014   [id number] EGD  . RADIAL HEAD ARTHROPLASTY Left 06/23/2017   Procedure: RADIAL HEAD ARTHROPLASTY WITH LIGAMENT REPAIR;  Surgeon: Meredith Pel, MD;  Location: [facility];  Service: Orthopedics;  Laterality: Left;    Prior to Admission medications   Medication Sig Start Date End Date Taking? Authorizing Provider  albuterol (PROVENTIL HFA;VENTOLIN HFA) 108 (90 BASE) MCG/ACT inhaler Inhale 2 puffs into the lungs every 6 (six) hours as needed for wheezing or shortness of breath. For shortness of breath   Yes [provider]  albuterol (PROVENTIL) (2.5 MG/3ML) 0.083% nebulizer solution Take 2.5 mg by nebulization every 6 (six) hours as needed for wheezing or shortness of breath.   Yes [provider]  aspirin EC 81 MG tablet Take 81 mg by mouth in the morning. Swallow whole.   Yes  [provider]  atorvastatin (LIPITOR) 40 MG tablet Take 40 mg by mouth at bedtime. 09/28/20  Yes [provider]  CARTIA XT 180 MG 24 hr capsule Take 180 mg by mouth 2 (two) times daily.  09/20/18  Yes [provider]  furosemide (LASIX) 40 MG tablet Take 40 mg by mouth daily as needed for fluid or edema.    Yes [provider]  HYDROcodone-acetaminophen (NORCO) 10-325 MG tablet Take 1 tablet by mouth every 4 (four) hours as needed for moderate pain. 03/02/20  Yes [provider]  linaclotide Rolan Lipa) 290 MCG CAPS capsule Take 1 capsule (290 mcg total) by mouth daily before breakfast. 01/13/21  Yes Erenest Rasher, PA-C  losartan-hydrochlorothiazide (HYZAAR) 100-25 MG tablet Take 1 tablet by mouth in the morning. 12/27/19  Yes [provider]  meloxicam (MOBIC) 7.5 MG tablet Take 7.5 mg by mouth 2 (two) times daily as needed for pain. 06/07/20  Yes [provider]  mirtazapine (REMERON) 7.5 MG tablet Take 7.5 mg by mouth at bedtime. 02/25/20  Yes [provider]  Multiple Vitamin (MULTIVITAMIN WITH MINERALS) TABS tablet Take 1 tablet by mouth in the morning.   Yes [provider]  phenazopyridine (PYRIDIUM) 200 MG tablet Take 200 mg by mouth 3 (three) times daily as needed for pain. 06/28/20  Yes [provider]  potassium chloride SA (KLOR-CON) 20 MEQ tablet Take 20 mEq by mouth daily as needed (fluid retention). Take with the furosemide. 06/26/18  Yes [provider]  tamsulosin (FLOMAX) 0.4 MG CAPS capsule Take 1 capsule (0.4 mg total) by mouth 2 (two) times daily. Patient taking differently: Take 0.4 mg by mouth at bedtime. 12/09/20  Yes McKenzie, Candee Furbish, MD  TRELEGY ELLIPTA 100-62.5-25 MCG/INH AEPB Take 1 puff by mouth in the morning. 02/19/21  Yes [provider]  polyethylene glycol-electrolytes (TRILYTE) 420 g solution Take 4,000 mLs by mouth as directed. 03/02/21   Daneil Dolin, MD     Allergies as of 03/02/2021  . (No Known Allergies)    Family History  Adopted: Yes  Problem Relation Age of Onset  . Cancer Sister        pancreatic  . Migraines Daughter   . Cancer Daughter        pre cancerous cells on cervix  . Cancer Sister        breast cancer, colon  . Blindness Maternal Grandfather   . Other Brother        murdered  . Other Sister        ruptured colon  . Cancer Sister        breast, skin  . Other Brother        was in [place]  . Cancer Sister        pancreatic  . Hypertension Sister   . Other Sister        ruptured colon  . Colon cancer Neg Hx     Social History   Socioeconomic History  . Marital status: Single    Spouse name: Not on file  . Number of children: Not on file  . Years of education: Not on file  . Highest education level: Not on file  Occupational History  . Occupation: retired  Tobacco Use  . Smoking status: Former Smoker    Years: 40.00    Types: Cigarettes    Quit date: 04/14/2013    Years since quitting: 7.9  . Smokeless tobacco: Never Used  Substance and Sexual Activity  . Alcohol use: Yes    Comment: glass of wine maybe once a year  . Drug use: No  . Sexual activity: Never    Birth control/protection: Surgical    Comment: hyst  Other Topics Concern  . Not on file  Social History Narrative  . Not on file   Social Determinants of Health   Financial Resource Strain: Not on file  Food Insecurity: Not on file  Transportation Needs: Not on file  Physical Activity: Not on file  Stress: Not on file  Social Connections: Not on file  Intimate Partner Violence: Not on file    Review of Systems: See HPI, otherwise negative ROS  Physical Exam: There were no vitals taken for this visit. General:   Alert,  Well-developed, well-nourished, pleasant and cooperative in NAD Neck:  Supple; no masses or thyromegaly. No significant cervical adenopathy. Lungs:  Clear throughout to auscultation.   No wheezes, crackles,  or rhonchi. No acute distress. Heart:  Regular rate and rhythm; no murmurs, clicks, rubs,  or gallops. Abdomen: Non-distended, normal bowel sounds.  Soft and nontender without appreciable mass or hepatosplenomegaly.  Pulses:  Normal pulses noted. Extremities:  Without clubbing or edema.  Impression/Plan: 70 year old lady here for surveillance colonoscopy.  History colonic adenoma.  Paper hematochezia reported. Chronic constipation fairly well managed on Linzess and MiraLAX.  I have offered the patient a surveillance colonoscopy per plan.  The risks, benefits, limitations, alternatives and imponderables have been reviewed with the patient. Questions have been  answered. All parties are agreeable.      Notice: This dictation was prepared with Dragon dictation along with smaller phrase technology. Any transcriptional errors that result from this process are unintentional and may not be corrected upon review.

## 2021-03-29 NOTE — Anesthesia Postprocedure Evaluation (Signed)
Anesthesia Post Note  Patient: [name]  Procedure(s) Performed: COLONOSCOPY WITH PROPOFOL (N/A ) POLYPECTOMY  Patient location during evaluation: Short Stay Anesthesia Type: General Level of consciousness: awake and alert and patient cooperative Pain management: satisfactory to patient Vital Signs Assessment: post-procedure vital signs reviewed and stable Respiratory status: spontaneous breathing Cardiovascular status: stable Postop Assessment: no apparent nausea or vomiting Anesthetic complications: no   No complications documented.   Last Vitals:  Vitals:   03/29/21 1100 03/29/21 1157  BP:  128/75  Pulse: 79 82  Resp: 15 17  Temp:  36.9 C  SpO2: 96% 94%    Last Pain:  Vitals:   03/29/21 1157  TempSrc: Oral  PainSc: 0-No pain                 Trapper Meech

## 2021-03-29 NOTE — Anesthesia Procedure Notes (Signed)
Date/Time: 03/29/2021 11:24 AM Performed by: Vista Deck, CRNA Pre-anesthesia Checklist: Patient identified, Emergency Drugs available, Suction available, Timeout performed and Patient being monitored Patient Re-evaluated:Patient Re-evaluated prior to induction Oxygen Delivery Method: Non-rebreather mask

## 2021-03-29 NOTE — Progress Notes (Addendum)
Awake. Resting quietly. States "I feel better." resp adequate/nonlabored. O2 continued at 3 l/m via nasal cannula. HOB elevated.

## 2021-03-29 NOTE — Progress Notes (Signed)
Arrived to Pre-op 2 via w/c. Alert. O2 via nasal cannula in place. States "I need oxygen. I am so nervous. I need something for my nerves."  resp adequate/mildly labored. O2 changed to wall O2 via nasal cannula. Reassurance given. Continues to state she is nervous and "shakey". Dr Briant Cedar at bedside. Order given for versed.

## 2021-03-29 NOTE — Op Note (Signed)
[employer] [facility] Patient Name: [name] Procedure Date: 03/29/2021 11:15 AM MRN: [id number] Date of Birth: 1951/08/24 Attending MD: Norvel Richards , MD CSN: [id number] Age: 70 Admit Type: Outpatient Procedure:                Colonoscopy Indications:              High risk colon cancer surveillance: Personal                            history of colonic polyps Providers:                Norvel Richards, MD, Rosina Lowenstein, RN, Nelma Rothman, Technician Referring MD:              Medicines:                Propofol per Anesthesia Complications:            No immediate complications. Estimated Blood Loss:     Estimated blood loss was minimal. Procedure:                Pre-Anesthesia Assessment:                           - Prior to the procedure, a History and Physical                            was performed, and patient medications and                            allergies were reviewed. The patient's tolerance of                            previous anesthesia was also reviewed. The risks                            and benefits of the procedure and the sedation                            options and risks were discussed with the patient.                            All questions were answered, and informed consent                            was obtained. Prior Anticoagulants: The patient has                            taken no previous anticoagulant or antiplatelet                            agents. ASA Grade Assessment: III - A patient with  severe systemic disease. After reviewing the risks                            and benefits, the patient was deemed in                            satisfactory condition to undergo the procedure.                           After obtaining informed consent, the colonoscope                            was passed under direct vision. Throughout the                            procedure, the  patient's blood pressure, pulse, and                            oxygen saturations were monitored continuously. The                            CF-HQ190L ([id number]) scope was introduced through                            the anus and advanced to the the cecum, identified                            by appendiceal orifice and ileocecal valve. The                            colonoscopy was performed without difficulty. The                            patient tolerated the procedure well. The quality                            of the bowel preparation was adequate. Scope In: 11:32:13 AM Scope Out: 11:49:01 AM Scope Withdrawal Time: 0 hours 10 minutes 51 seconds  Total Procedure Duration: 0 hours 16 minutes 48 seconds  Findings:      The perianal and digital rectal examinations were normal.      Scattered medium-mouthed diverticula were found in the sigmoid colon and       descending colon.      A 5 mm polyp was found in the ascending colon. The polyp was sessile.       The polyp was removed with a cold snare. Resection and retrieval were       complete. Estimated blood loss was minimal.      Non-bleeding internal hemorrhoids were found during retroflexion. The       hemorrhoids were moderate, medium-sized and Grade II (internal       hemorrhoids that prolapse but reduce spontaneously).      The exam was otherwise without abnormality on direct and retroflexion       views. Impression:               -  Diverticulosis in the sigmoid colon and in the                            descending colon.                           - One 5 mm polyp in the ascending colon, removed                            with a cold snare. Resected and retrieved.                           - Non-bleeding internal hemorrhoids.                           - The examination was otherwise normal on direct                            and retroflexion views. Moderate Sedation:      Moderate (conscious) sedation was personally  administered by an       anesthesia professional. The following parameters were monitored: oxygen       saturation, heart rate, blood pressure, respiratory rate, EKG, adequacy       of pulmonary ventilation, and response to care. Recommendation:           - Patient has a contact number available for                            emergencies. The signs and symptoms of potential                            delayed complications were discussed with the                            patient. Return to normal activities tomorrow.                            Written discharge instructions were provided to the                            patient.                           - Resume previous diet.                           - Continue present medications.                           - Repeat colonoscopy after studies are complete for                            surveillance.                           - Return to GI office in 2 months. Procedure Code(s):        ---  Professional ---                           [phone], Colonoscopy, flexible; with removal of                            tumor(s), polyp(s), or other lesion(s) by snare                            technique Diagnosis Code(s):        --- Professional ---                           Z86.010, Personal history of colonic polyps                           K64.1, Second degree hemorrhoids                           K63.5, Polyp of colon                           K57.30, Diverticulosis of large intestine without                            perforation or abscess without bleeding CPT copyright 2019 American Medical Association. All rights reserved. The codes documented in this report are preliminary and upon coder review may  be revised to meet current compliance requirements. Cristopher Estimable. Kareema Keitt, MD Norvel Richards, MD 03/29/2021 11:57:58 AM This report has been signed electronically. Number of Addenda: 0

## 2021-03-29 NOTE — Anesthesia Preprocedure Evaluation (Signed)
Anesthesia Evaluation  Patient identified by MRN, date of birth, ID band Patient awake    Reviewed: Allergy & Precautions, H&P , NPO status , Patient's Chart, lab work & pertinent test results, reviewed documented beta blocker date and time   Airway Mallampati: II  TM Distance: >3 FB Neck ROM: full    Dental no notable dental hx.    Pulmonary pneumonia, COPD, former smoker,    Pulmonary exam normal breath sounds clear to auscultation       Cardiovascular Exercise Tolerance: Good hypertension, negative cardio ROS   Rhythm:regular Rate:Normal     Neuro/Psych PSYCHIATRIC DISORDERS Anxiety negative neurological ROS     GI/Hepatic Neg liver ROS, GERD  Medicated,  Endo/Other  negative endocrine ROS  Renal/GU negative Renal ROS  negative genitourinary   Musculoskeletal   Abdominal   Peds  Hematology  (+) Blood dyscrasia, anemia ,   Anesthesia Other Findings   Reproductive/Obstetrics negative OB ROS                             Anesthesia Physical Anesthesia Plan  ASA: II  Anesthesia Plan: General   Post-op Pain Management:    Induction:   PONV Risk Score and Plan: Propofol infusion  Airway Management Planned:   Additional Equipment:   Intra-op Plan:   Post-operative Plan:   Informed Consent: I have reviewed the patients History and Physical, chart, labs and discussed the procedure including the risks, benefits and alternatives for the proposed anesthesia with the patient or authorized representative who has indicated his/her understanding and acceptance.     Dental Advisory Given  Plan Discussed with: CRNA  Anesthesia Plan Comments:         Anesthesia Quick Evaluation

## 2021-03-30 ENCOUNTER — Encounter: Payer: Self-pay | Admitting: Internal Medicine

## 2021-03-30 LAB — SURGICAL PATHOLOGY

## 2021-04-02 ENCOUNTER — Telehealth: Payer: Self-pay | Admitting: Internal Medicine

## 2021-04-02 ENCOUNTER — Encounter (HOSPITAL_COMMUNITY): Payer: Self-pay | Admitting: Internal Medicine

## 2021-04-02 NOTE — Telephone Encounter (Signed)
Pt calling to see if her colonoscopy results are back yet. I told her it could take 5-7 business days, but I would let the nurse know that she had called. [phone]

## 2021-04-05 DIAGNOSIS — H2511 Age-related nuclear cataract, right eye: Secondary | ICD-10-CM | POA: Diagnosis not present

## 2021-04-05 DIAGNOSIS — Z961 Presence of intraocular lens: Secondary | ICD-10-CM | POA: Diagnosis not present

## 2021-04-05 DIAGNOSIS — H40013 Open angle with borderline findings, low risk, bilateral: Secondary | ICD-10-CM | POA: Diagnosis not present

## 2021-04-05 DIAGNOSIS — H16223 Keratoconjunctivitis sicca, not specified as Sjogren's, bilateral: Secondary | ICD-10-CM | POA: Diagnosis not present

## 2021-04-05 NOTE — Telephone Encounter (Signed)
Lmom, waiting on a return call.  

## 2021-04-05 NOTE — Telephone Encounter (Signed)
Spoke with pt. Pt received her letter with results. Pt is aware of results.

## 2021-04-06 DIAGNOSIS — J449 Chronic obstructive pulmonary disease, unspecified: Secondary | ICD-10-CM | POA: Diagnosis not present

## 2021-04-06 DIAGNOSIS — R339 Retention of urine, unspecified: Secondary | ICD-10-CM | POA: Diagnosis not present

## 2021-04-06 DIAGNOSIS — E782 Mixed hyperlipidemia: Secondary | ICD-10-CM | POA: Diagnosis not present

## 2021-04-06 DIAGNOSIS — K5904 Chronic idiopathic constipation: Secondary | ICD-10-CM | POA: Diagnosis not present

## 2021-04-06 DIAGNOSIS — Z0189 Encounter for other specified special examinations: Secondary | ICD-10-CM | POA: Diagnosis not present

## 2021-04-06 DIAGNOSIS — Z86010 Personal history of colonic polyps: Secondary | ICD-10-CM | POA: Diagnosis not present

## 2021-04-06 DIAGNOSIS — F5101 Primary insomnia: Secondary | ICD-10-CM | POA: Diagnosis not present

## 2021-04-08 DIAGNOSIS — R5383 Other fatigue: Secondary | ICD-10-CM | POA: Diagnosis not present

## 2021-04-08 DIAGNOSIS — I10 Essential (primary) hypertension: Secondary | ICD-10-CM | POA: Diagnosis not present

## 2021-04-08 DIAGNOSIS — Z79899 Other long term (current) drug therapy: Secondary | ICD-10-CM | POA: Diagnosis not present

## 2021-04-14 DIAGNOSIS — J9611 Chronic respiratory failure with hypoxia: Secondary | ICD-10-CM | POA: Diagnosis not present

## 2021-04-14 DIAGNOSIS — J449 Chronic obstructive pulmonary disease, unspecified: Secondary | ICD-10-CM | POA: Diagnosis not present

## 2021-05-03 DIAGNOSIS — J9611 Chronic respiratory failure with hypoxia: Secondary | ICD-10-CM | POA: Diagnosis not present

## 2021-05-03 DIAGNOSIS — J449 Chronic obstructive pulmonary disease, unspecified: Secondary | ICD-10-CM | POA: Diagnosis not present

## 2021-05-04 DIAGNOSIS — R0602 Shortness of breath: Secondary | ICD-10-CM | POA: Diagnosis not present

## 2021-05-04 DIAGNOSIS — Z79899 Other long term (current) drug therapy: Secondary | ICD-10-CM | POA: Diagnosis not present

## 2021-05-04 DIAGNOSIS — M2550 Pain in unspecified joint: Secondary | ICD-10-CM | POA: Diagnosis not present

## 2021-05-14 DIAGNOSIS — J449 Chronic obstructive pulmonary disease, unspecified: Secondary | ICD-10-CM | POA: Diagnosis not present

## 2021-05-14 DIAGNOSIS — J9611 Chronic respiratory failure with hypoxia: Secondary | ICD-10-CM | POA: Diagnosis not present

## 2021-05-27 ENCOUNTER — Ambulatory Visit (INDEPENDENT_AMBULATORY_CARE_PROVIDER_SITE_OTHER): Payer: Medicare Other | Admitting: Gastroenterology

## 2021-05-27 ENCOUNTER — Encounter: Payer: Self-pay | Admitting: Gastroenterology

## 2021-05-27 ENCOUNTER — Telehealth: Payer: Self-pay | Admitting: Gastroenterology

## 2021-05-27 DIAGNOSIS — K5903 Drug induced constipation: Secondary | ICD-10-CM | POA: Diagnosis not present

## 2021-05-27 DIAGNOSIS — T402X5A Adverse effect of other opioids, initial encounter: Secondary | ICD-10-CM

## 2021-05-27 MED ORDER — NALOXEGOL OXALATE 25 MG PO TABS: 25.0000 mg | ORAL_TABLET | Freq: Every day | ORAL | 5 refills | Status: DC

## 2021-05-27 NOTE — Telephone Encounter (Signed)
Manuela Schwartz, please arrange office visit in 3-4 months. Thanks!

## 2021-05-27 NOTE — Progress Notes (Signed)
Primary Care Physician:  Asencion Noble, MD  Primary GI: Dr. Gala Romney  Patient Location: Home   Provider Location: [facility] office   Reason for Visit: Follow-up   Persons present on the virtual encounter, with roles: Patient and NP   Total time (minutes) spent on medical discussion: 15 minutes   Due to COVID-19, visit was conducted using virtual method.  Visit was requested by patient.  Virtual Visit via MyChart Telephone Note Due to COVID-19, visit is conducted virtually and was requested by patient.   I connected with [name] on 05/27/21 at 10:30 AM EDT by telephone and verified that I am speaking with the correct person using two identifiers.   I discussed the limitations, risks, security and privacy concerns of performing an evaluation and management service by telephone and the availability of in person appointments. I also discussed with the patient that there may be a patient responsible charge related to this service. The patient expressed understanding and agreed to proceed.  Chief Complaint  Patient presents with  . Constipation    Has bm approx every 2 days, has to stain little bit     History of Present Illness: 70 year old female presenting today in follow-up with history of chronic constipation likely opioid-induced, s/p colonoscopy recently in interim with tubular adenoma. 5 year surveillance recommended.   Not taking Linzess every day for past week, just to see how she would do. She notes no change and still has BM daily to every other day with straining. Taking daily did not improve. Miralax at night. Strains with each BM. No abdominal pain. Has not tried Movantik.   Past Medical History:  Diagnosis Date  . Anxiety   . Atrophic vaginitis 10/09/2015  . Baker's cyst    left leg  . Blood in urine 10/09/2015  . CHF (congestive heart failure) ([place])   . COPD (chronic obstructive pulmonary disease) ([place])   . Heart murmur   . Hematuria 10/09/2015  .  Hypertension   . Nicotine addiction 04/07/2014  . Pneumonia   . Urinary urgency 01/25/2016  . Vaginal bleeding 10/09/2015     Past Surgical History:  Procedure Laterality Date  . ABDOMINAL HYSTERECTOMY    . CATARACT EXTRACTION Left 03/2015  . CESAREAN SECTION    . COLONOSCOPY  10/19/2004   Dr. Gala Romney- internal hemorrhoids, o/w normal rectum, normal colon  . COLONOSCOPY N/A 07/30/2014   OVZ:CHYIFOY diverticulosis. Colonic polyp-removed TA. repeat TCS 07/2021  . COLONOSCOPY WITH PROPOFOL N/A 03/29/2021   diverticulosis in sigmoid and descending colon, one 5 mm polyp ascending colon (adenoma), non-bleeding internal hemorrhoids. 5 year surveillance  . ESOPHAGOGASTRODUODENOSCOPY N/A 07/30/2014   DXA:JOINOM EGD  . POLYPECTOMY  03/29/2021   Procedure: POLYPECTOMY;  Surgeon: Daneil Dolin, MD;  Location: [facility];  Service: Endoscopy;;  . RADIAL HEAD ARTHROPLASTY Left 06/23/2017   Procedure: RADIAL HEAD ARTHROPLASTY WITH LIGAMENT REPAIR;  Surgeon: Meredith Pel, MD;  Location: [facility];  Service: Orthopedics;  Laterality: Left;     Current Meds  Medication Sig  . albuterol (PROVENTIL HFA;VENTOLIN HFA) 108 (90 BASE) MCG/ACT inhaler Inhale 2 puffs into the lungs every 6 (six) hours as needed for wheezing or shortness of breath. For shortness of breath  . albuterol (PROVENTIL) (2.5 MG/3ML) 0.083% nebulizer solution Take 2.5 mg by nebulization every 6 (six) hours as needed for wheezing or shortness of breath.  Marland Kitchen aspirin EC 81 MG tablet Take 81 mg by mouth in the morning. Swallow whole.  Marland Kitchen  atorvastatin (LIPITOR) 40 MG tablet Take 40 mg by mouth at bedtime.  Marland Kitchen CARTIA XT 180 MG 24 hr capsule Take 180 mg by mouth 2 (two) times daily.   . furosemide (LASIX) 40 MG tablet Take 40 mg by mouth daily as needed for fluid or edema.   Marland Kitchen HYDROcodone-acetaminophen (NORCO) 10-325 MG tablet Take 1 tablet by mouth every 4 (four) hours as needed for moderate pain.  Marland Kitchen linaclotide (LINZESS) 290 MCG CAPS capsule Take  1 capsule (290 mcg total) by mouth daily before breakfast.  . losartan-hydrochlorothiazide (HYZAAR) 100-25 MG tablet Take 1 tablet by mouth in the morning.  . meloxicam (MOBIC) 7.5 MG tablet Take 7.5 mg by mouth 2 (two) times daily as needed for pain.  . mirtazapine (REMERON) 7.5 MG tablet Take 7.5 mg by mouth at bedtime.  . Multiple Vitamin (MULTIVITAMIN WITH MINERALS) TABS tablet Take 1 tablet by mouth in the morning.  . naloxegol oxalate (MOVANTIK) 25 MG TABS tablet Take 1 tablet (25 mg total) by mouth daily. 1 hour before or 2 hours after breakfast.  . phenazopyridine (PYRIDIUM) 200 MG tablet Take 200 mg by mouth 3 (three) times daily as needed for pain.  . potassium chloride SA (KLOR-CON) 20 MEQ tablet Take 20 mEq by mouth daily as needed (fluid retention). Take with the furosemide.  . tamsulosin (FLOMAX) 0.4 MG CAPS capsule Take 1 capsule (0.4 mg total) by mouth 2 (two) times daily. (Patient taking differently: Take 0.4 mg by mouth at bedtime.)  . TRELEGY ELLIPTA 100-62.5-25 MCG/INH AEPB Take 1 puff by mouth in the morning.     Family History  Adopted: Yes  Problem Relation Age of Onset  . Cancer Sister        pancreatic  . Migraines Daughter   . Cancer Daughter        pre cancerous cells on cervix  . Cancer Sister        breast cancer, colon  . Blindness Maternal Grandfather   . Other Brother        murdered  . Other Sister        ruptured colon  . Cancer Sister        breast, skin  . Other Brother        was in [place]  . Cancer Sister        pancreatic  . Hypertension Sister   . Other Sister        ruptured colon  . Colon cancer Neg Hx     Social History   Socioeconomic History  . Marital status: Single    Spouse name: Not on file  . Number of children: Not on file  . Years of education: Not on file  . Highest education level: Not on file  Occupational History  . Occupation: retired  Tobacco Use  . Smoking status: Former Smoker    Years: 40.00    Types:  Cigarettes    Quit date: 04/14/2013    Years since quitting: 8.1  . Smokeless tobacco: Never Used  Substance and Sexual Activity  . Alcohol use: Yes    Comment: glass of wine maybe once a year  . Drug use: No  . Sexual activity: Never    Birth control/protection: Surgical    Comment: hyst  Other Topics Concern  . Not on file  Social History Narrative  . Not on file   Social Determinants of Health   Financial Resource Strain: Not on file  Food Insecurity: Not on  file  Transportation Needs: Not on file  Physical Activity: Not on file  Stress: Not on file  Social Connections: Not on file       Review of Systems: Gen: Denies fever, chills, anorexia. Denies fatigue, weakness, weight loss.  CV: Denies chest pain, palpitations, syncope, peripheral edema, and claudication. Resp: Denies dyspnea at rest, cough, wheezing, coughing up blood, and pleurisy. GI: see HPI Derm: Denies rash, itching, dry skin Psych: Denies depression, anxiety, memory loss, confusion. No homicidal or suicidal ideation.  Heme: Denies bruising, bleeding, and enlarged lymph nodes.  Observations/Objective: No distress. Unable to perform physical exam due to telephone encounter. No video available.   Assessment and Plan: 70 year old female with history of chronic constipation likely opioid-induced, without ideal results on Linzess 290 mcg and Miralax.   I have sent in Movantik 25 mg daily to take 1 hour before or 2 hours after breakfast. If this is not covered ,we will need to return to linzess and add stool softeners in addition to Miralax. Could also trial Amitiza if she has not done so already. Will have her return in 3-4 months.   Follow Up Instructions:    I discussed the assessment and treatment plan with the patient. The patient was provided an opportunity to ask questions and all were answered. The patient agreed with the plan and demonstrated an understanding of the instructions.   The patient was  advised to call back or seek an in-person evaluation if the symptoms worsen or if the condition fails to improve as anticipated.  I provided 15 minutes of non face-to-face time during this telephone encounter.  Annitta Needs, PhD, ANP-BC [facility]

## 2021-05-27 NOTE — Patient Instructions (Signed)
I have sent in Movantik to take an hour before or 2 hours after breakfast. If this is not covered, please let us know!  We will see you in 3-4 months!  Annitta Needs, PhD, ANP-BC [facility]

## 2021-05-31 NOTE — Progress Notes (Signed)
Cc'ed to pcp °

## 2021-05-31 NOTE — Telephone Encounter (Signed)
ON RECALL TO FOLLOW UP IN 3-4 MONTHS WITH RMR

## 2021-06-01 DIAGNOSIS — Z0001 Encounter for general adult medical examination with abnormal findings: Secondary | ICD-10-CM | POA: Diagnosis not present

## 2021-06-01 DIAGNOSIS — J449 Chronic obstructive pulmonary disease, unspecified: Secondary | ICD-10-CM | POA: Diagnosis not present

## 2021-06-01 DIAGNOSIS — Z79899 Other long term (current) drug therapy: Secondary | ICD-10-CM | POA: Diagnosis not present

## 2021-06-01 DIAGNOSIS — M2550 Pain in unspecified joint: Secondary | ICD-10-CM | POA: Diagnosis not present

## 2021-06-03 ENCOUNTER — Telehealth: Payer: Self-pay | Admitting: Internal Medicine

## 2021-06-03 NOTE — Telephone Encounter (Signed)
Pt had a recent virtual visit with Roseanne Kaufman, NP and wanted to let her know that the prescriptions she gave her was why too expensive and is there something else for her to take. [phone]

## 2021-06-04 NOTE — Telephone Encounter (Signed)
Phoned and spoke with this pt advised I would try a PA for the medication. PA tried didn't go through because its stating pt has a Education officer, museum, pt advises that she has [employer] I advised her to contact the pharmacy to see what they have on file because they initiated the PA. While on the phone with the pt I was given her instructions of the combination to use with her Linzess, stool softener, and Miralax. She still wanted me to do a PA even though she stated to me that it is only a $3.00 difference between Linzess 290 mcg and the Movantik. Will call insurance on Monday and try from that end to see if I can get PA approved.

## 2021-06-14 DIAGNOSIS — J449 Chronic obstructive pulmonary disease, unspecified: Secondary | ICD-10-CM | POA: Diagnosis not present

## 2021-06-14 DIAGNOSIS — J9611 Chronic respiratory failure with hypoxia: Secondary | ICD-10-CM | POA: Diagnosis not present

## 2021-06-15 ENCOUNTER — Other Ambulatory Visit: Payer: Self-pay

## 2021-06-15 ENCOUNTER — Emergency Department (HOSPITAL_COMMUNITY): Payer: Medicare Other

## 2021-06-15 ENCOUNTER — Encounter (HOSPITAL_COMMUNITY): Payer: Self-pay | Admitting: Emergency Medicine

## 2021-06-15 ENCOUNTER — Inpatient Hospital Stay (HOSPITAL_COMMUNITY)
Admission: EM | Admit: 2021-06-15 | Discharge: 2021-06-18 | DRG: 190 | Disposition: A | Discharge status: Home Health | Payer: Medicare Other | Attending: Internal Medicine | Admitting: Internal Medicine

## 2021-06-15 DIAGNOSIS — I110 Hypertensive heart disease with heart failure: Secondary | ICD-10-CM | POA: Diagnosis not present

## 2021-06-15 DIAGNOSIS — Z791 Long term (current) use of non-steroidal anti-inflammatories (NSAID): Secondary | ICD-10-CM

## 2021-06-15 DIAGNOSIS — K5909 Other constipation: Secondary | ICD-10-CM | POA: Diagnosis not present

## 2021-06-15 DIAGNOSIS — J9622 Acute and chronic respiratory failure with hypercapnia: Secondary | ICD-10-CM | POA: Diagnosis present

## 2021-06-15 DIAGNOSIS — M1990 Unspecified osteoarthritis, unspecified site: Secondary | ICD-10-CM | POA: Diagnosis not present

## 2021-06-15 DIAGNOSIS — G8929 Other chronic pain: Secondary | ICD-10-CM | POA: Diagnosis not present

## 2021-06-15 DIAGNOSIS — Z743 Need for continuous supervision: Secondary | ICD-10-CM | POA: Diagnosis not present

## 2021-06-15 DIAGNOSIS — I159 Secondary hypertension, unspecified: Secondary | ICD-10-CM | POA: Diagnosis not present

## 2021-06-15 DIAGNOSIS — M549 Dorsalgia, unspecified: Secondary | ICD-10-CM | POA: Diagnosis present

## 2021-06-15 DIAGNOSIS — R300 Dysuria: Secondary | ICD-10-CM | POA: Diagnosis not present

## 2021-06-15 DIAGNOSIS — F32A Depression, unspecified: Secondary | ICD-10-CM | POA: Diagnosis present

## 2021-06-15 DIAGNOSIS — Z8249 Family history of ischemic heart disease and other diseases of the circulatory system: Secondary | ICD-10-CM

## 2021-06-15 DIAGNOSIS — J441 Chronic obstructive pulmonary disease with (acute) exacerbation: Secondary | ICD-10-CM | POA: Diagnosis not present

## 2021-06-15 DIAGNOSIS — K219 Gastro-esophageal reflux disease without esophagitis: Secondary | ICD-10-CM | POA: Diagnosis present

## 2021-06-15 DIAGNOSIS — Z79899 Other long term (current) drug therapy: Secondary | ICD-10-CM

## 2021-06-15 DIAGNOSIS — F17200 Nicotine dependence, unspecified, uncomplicated: Secondary | ICD-10-CM | POA: Diagnosis present

## 2021-06-15 DIAGNOSIS — D539 Nutritional anemia, unspecified: Secondary | ICD-10-CM | POA: Diagnosis present

## 2021-06-15 DIAGNOSIS — R6889 Other general symptoms and signs: Secondary | ICD-10-CM | POA: Diagnosis not present

## 2021-06-15 DIAGNOSIS — R339 Retention of urine, unspecified: Secondary | ICD-10-CM | POA: Diagnosis present

## 2021-06-15 DIAGNOSIS — I517 Cardiomegaly: Secondary | ICD-10-CM | POA: Diagnosis not present

## 2021-06-15 DIAGNOSIS — Z7982 Long term (current) use of aspirin: Secondary | ICD-10-CM

## 2021-06-15 DIAGNOSIS — J9621 Acute and chronic respiratory failure with hypoxia: Secondary | ICD-10-CM | POA: Diagnosis not present

## 2021-06-15 DIAGNOSIS — Z8701 Personal history of pneumonia (recurrent): Secondary | ICD-10-CM | POA: Diagnosis not present

## 2021-06-15 DIAGNOSIS — E785 Hyperlipidemia, unspecified: Secondary | ICD-10-CM | POA: Diagnosis not present

## 2021-06-15 DIAGNOSIS — Z20822 Contact with and (suspected) exposure to covid-19: Secondary | ICD-10-CM | POA: Diagnosis not present

## 2021-06-15 DIAGNOSIS — F419 Anxiety disorder, unspecified: Secondary | ICD-10-CM | POA: Diagnosis present

## 2021-06-15 DIAGNOSIS — D126 Benign neoplasm of colon, unspecified: Secondary | ICD-10-CM | POA: Diagnosis present

## 2021-06-15 DIAGNOSIS — F17210 Nicotine dependence, cigarettes, uncomplicated: Secondary | ICD-10-CM | POA: Diagnosis not present

## 2021-06-15 DIAGNOSIS — I10 Essential (primary) hypertension: Secondary | ICD-10-CM | POA: Diagnosis present

## 2021-06-15 DIAGNOSIS — Z7951 Long term (current) use of inhaled steroids: Secondary | ICD-10-CM | POA: Diagnosis not present

## 2021-06-15 DIAGNOSIS — R0602 Shortness of breath: Secondary | ICD-10-CM | POA: Diagnosis not present

## 2021-06-15 DIAGNOSIS — R509 Fever, unspecified: Secondary | ICD-10-CM | POA: Diagnosis not present

## 2021-06-15 DIAGNOSIS — I5033 Acute on chronic diastolic (congestive) heart failure: Secondary | ICD-10-CM

## 2021-06-15 DIAGNOSIS — I509 Heart failure, unspecified: Secondary | ICD-10-CM | POA: Diagnosis present

## 2021-06-15 DIAGNOSIS — R531 Weakness: Secondary | ICD-10-CM | POA: Diagnosis not present

## 2021-06-15 DIAGNOSIS — Z90710 Acquired absence of both cervix and uterus: Secondary | ICD-10-CM

## 2021-06-15 LAB — RESP PANEL BY RT-PCR (FLU A&B, COVID) ARPGX2
Influenza A by PCR: NEGATIVE
Influenza B by PCR: NEGATIVE
SARS Coronavirus 2 by RT PCR: NEGATIVE

## 2021-06-15 LAB — CBC WITH DIFFERENTIAL/PLATELET
Abs Immature Granulocytes: 0.06 10*3/uL (ref 0.00–0.07)
Basophils Absolute: 0 10*3/uL (ref 0.0–0.1)
Basophils Relative: 0 %
Eosinophils Absolute: 0.1 10*3/uL (ref 0.0–0.5)
Eosinophils Relative: 1 %
HCT: 38.3 % (ref 36.0–46.0)
Hemoglobin: 11.1 g/dL — ABNORMAL LOW (ref 12.0–15.0)
Immature Granulocytes: 1 %
Lymphocytes Relative: 8 %
Lymphs Abs: 1 10*3/uL (ref 0.7–4.0)
MCH: 30.3 pg (ref 26.0–34.0)
MCHC: 29 g/dL — ABNORMAL LOW (ref 30.0–36.0)
MCV: 104.6 fL — ABNORMAL HIGH (ref 80.0–100.0)
Monocytes Absolute: 1 10*3/uL (ref 0.1–1.0)
Monocytes Relative: 9 %
Neutro Abs: 9.3 10*3/uL — ABNORMAL HIGH (ref 1.7–7.7)
Neutrophils Relative %: 81 %
Platelets: 187 10*3/uL (ref 150–400)
RBC: 3.66 MIL/uL — ABNORMAL LOW (ref 3.87–5.11)
RDW: 12.4 % (ref 11.5–15.5)
WBC: 11.5 10*3/uL — ABNORMAL HIGH (ref 4.0–10.5)
nRBC: 0 % (ref 0.0–0.2)

## 2021-06-15 LAB — BLOOD GAS, VENOUS
Acid-Base Excess: 21.9 mmol/L — ABNORMAL HIGH (ref 0.0–2.0)
Bicarbonate: 52.8 mmol/L — ABNORMAL HIGH (ref 20.0–28.0)
O2 Saturation: 64 %
Patient temperature: 98.6
pCO2, Ven: 115 mmHg (ref 44.0–60.0)
pH, Ven: 7.285 (ref 7.250–7.430)
pO2, Ven: 37 mmHg (ref 32.0–45.0)

## 2021-06-15 LAB — BLOOD GAS, ARTERIAL
Acid-Base Excess: 19.1 mmol/L — ABNORMAL HIGH (ref 0.0–2.0)
Bicarbonate: 47.8 mmol/L — ABNORMAL HIGH (ref 20.0–28.0)
O2 Saturation: 89.2 %
Patient temperature: 98.6
pCO2 arterial: 81.8 mmHg (ref 32.0–48.0)
pH, Arterial: 7.384 (ref 7.350–7.450)
pO2, Arterial: 60.1 mmHg — ABNORMAL LOW (ref 83.0–108.0)

## 2021-06-15 LAB — FOLATE: Folate: 45.6 ng/mL (ref >5.9)

## 2021-06-15 LAB — HIV ANTIBODY (ROUTINE TESTING W REFLEX): HIV Screen 4th Generation wRfx: NONREACTIVE

## 2021-06-15 LAB — COMPREHENSIVE METABOLIC PANEL
ALT: 14 U/L (ref 0–44)
AST: 13 U/L — ABNORMAL LOW (ref 15–41)
Albumin: 4 g/dL (ref 3.5–5.0)
Alkaline Phosphatase: 50 U/L (ref 38–126)
Anion gap: 6 (ref 5–15)
BUN: 13 mg/dL (ref 8–23)
CO2: 50 mmol/L — ABNORMAL HIGH (ref 22–32)
Calcium: 9 mg/dL (ref 8.9–10.3)
Chloride: 81 mmol/L — ABNORMAL LOW (ref 98–111)
Creatinine, Ser: 0.33 mg/dL — ABNORMAL LOW (ref 0.44–1.00)
GFR, Estimated: >60 mL/min (ref >60)
Glucose, Bld: 126 mg/dL — ABNORMAL HIGH (ref 70–99)
Potassium: 3.8 mmol/L (ref 3.5–5.1)
Sodium: 137 mmol/L (ref 135–145)
Total Bilirubin: 0.3 mg/dL (ref 0.3–1.2)
Total Protein: 6.9 g/dL (ref 6.5–8.1)

## 2021-06-15 LAB — BRAIN NATRIURETIC PEPTIDE: B Natriuretic Peptide: 114.3 pg/mL — ABNORMAL HIGH (ref 0.0–100.0)

## 2021-06-15 LAB — CBC
HCT: 38.1 % (ref 36.0–46.0)
Hemoglobin: 11.2 g/dL — ABNORMAL LOW (ref 12.0–15.0)
MCH: 30.6 pg (ref 26.0–34.0)
MCHC: 29.4 g/dL — ABNORMAL LOW (ref 30.0–36.0)
MCV: 104.1 fL — ABNORMAL HIGH (ref 80.0–100.0)
Platelets: 183 10*3/uL (ref 150–400)
RBC: 3.66 MIL/uL — ABNORMAL LOW (ref 3.87–5.11)
RDW: 12.3 % (ref 11.5–15.5)
WBC: 12.8 10*3/uL — ABNORMAL HIGH (ref 4.0–10.5)
nRBC: 0 % (ref 0.0–0.2)

## 2021-06-15 LAB — CREATININE, SERUM
Creatinine, Ser: 0.45 mg/dL (ref 0.44–1.00)
GFR, Estimated: >60 mL/min (ref >60)

## 2021-06-15 LAB — MAGNESIUM: Magnesium: 1.9 mg/dL (ref 1.7–2.4)

## 2021-06-15 LAB — VITAMIN B12: Vitamin B-12: 341 pg/mL (ref 180–914)

## 2021-06-15 MED ORDER — DILTIAZEM HCL ER COATED BEADS 180 MG PO CP24
180.0000 mg | ORAL_CAPSULE | Freq: Two times a day (BID) | ORAL | Status: DC
  Administered 2021-06-15 – 2021-06-18 (×7): 180 mg via ORAL
  Filled 2021-06-15 (×7): qty 1

## 2021-06-15 MED ORDER — ENOXAPARIN SODIUM 40 MG/0.4ML IJ SOSY
40.0000 mg | PREFILLED_SYRINGE | INTRAMUSCULAR | Status: DC
  Administered 2021-06-15 – 2021-06-17 (×3): 40 mg via SUBCUTANEOUS
  Filled 2021-06-15 (×3): qty 0.4

## 2021-06-15 MED ORDER — PREDNISONE 20 MG PO TABS
40.0000 mg | ORAL_TABLET | Freq: Every day | ORAL | Status: DC
  Administered 2021-06-16 – 2021-06-18 (×3): 40 mg via ORAL
  Filled 2021-06-15 (×3): qty 2

## 2021-06-15 MED ORDER — LINACLOTIDE 145 MCG PO CAPS
290.0000 ug | ORAL_CAPSULE | Freq: Every day | ORAL | Status: DC
  Administered 2021-06-17: 290 ug via ORAL
  Filled 2021-06-15 (×3): qty 2

## 2021-06-15 MED ORDER — ATORVASTATIN CALCIUM 40 MG PO TABS
40.0000 mg | ORAL_TABLET | Freq: Every day | ORAL | Status: DC
  Administered 2021-06-15 – 2021-06-17 (×3): 40 mg via ORAL
  Filled 2021-06-15 (×3): qty 1

## 2021-06-15 MED ORDER — ALBUTEROL (5 MG/ML) CONTINUOUS INHALATION SOLN
10.0000 mg/h | INHALATION_SOLUTION | RESPIRATORY_TRACT | Status: DC
  Administered 2021-06-15: 10 mg/h via RESPIRATORY_TRACT
  Filled 2021-06-15: qty 20

## 2021-06-15 MED ORDER — METHYLPREDNISOLONE SODIUM SUCC 125 MG IJ SOLR
125.0000 mg | Freq: Once | INTRAMUSCULAR | Status: AC
  Administered 2021-06-15: 125 mg via INTRAVENOUS
  Filled 2021-06-15: qty 2

## 2021-06-15 MED ORDER — LOSARTAN POTASSIUM-HCTZ 100-25 MG PO TABS: 1.0000 | ORAL_TABLET | Freq: Every morning | ORAL | Status: DC

## 2021-06-15 MED ORDER — UMECLIDINIUM BROMIDE 62.5 MCG/INH IN AEPB
1.0000 | INHALATION_SPRAY | Freq: Every day | RESPIRATORY_TRACT | Status: DC
  Filled 2021-06-15: qty 7

## 2021-06-15 MED ORDER — HYDROCHLOROTHIAZIDE 25 MG PO TABS
25.0000 mg | ORAL_TABLET | Freq: Every day | ORAL | Status: DC
  Administered 2021-06-15 – 2021-06-18 (×4): 25 mg via ORAL
  Filled 2021-06-15 (×4): qty 1

## 2021-06-15 MED ORDER — METHYLPREDNISOLONE SODIUM SUCC 125 MG IJ SOLR
60.0000 mg | Freq: Four times a day (QID) | INTRAMUSCULAR | Status: AC
  Administered 2021-06-15 – 2021-06-16 (×4): 60 mg via INTRAVENOUS
  Filled 2021-06-15 (×4): qty 2

## 2021-06-15 MED ORDER — HYDROCODONE-ACETAMINOPHEN 10-325 MG PO TABS
1.0000 | ORAL_TABLET | Freq: Four times a day (QID) | ORAL | Status: DC
  Administered 2021-06-15 – 2021-06-18 (×14): 1 via ORAL
  Filled 2021-06-15 (×14): qty 1

## 2021-06-15 MED ORDER — DOXYCYCLINE HYCLATE 100 MG PO TABS
100.0000 mg | ORAL_TABLET | Freq: Two times a day (BID) | ORAL | Status: DC
  Administered 2021-06-15 – 2021-06-18 (×7): 100 mg via ORAL
  Filled 2021-06-15 (×7): qty 1

## 2021-06-15 MED ORDER — MIRTAZAPINE 15 MG PO TABS
7.5000 mg | ORAL_TABLET | Freq: Every day | ORAL | Status: DC
  Administered 2021-06-15 – 2021-06-17 (×3): 7.5 mg via ORAL
  Filled 2021-06-15 (×3): qty 1

## 2021-06-15 MED ORDER — FLUTICASONE FUROATE-VILANTEROL 100-25 MCG/INH IN AEPB
1.0000 | INHALATION_SPRAY | Freq: Every day | RESPIRATORY_TRACT | Status: DC
  Administered 2021-06-16 – 2021-06-18 (×3): 1 via RESPIRATORY_TRACT
  Filled 2021-06-15: qty 28

## 2021-06-15 MED ORDER — TAMSULOSIN HCL 0.4 MG PO CAPS
0.4000 mg | ORAL_CAPSULE | Freq: Two times a day (BID) | ORAL | Status: DC
  Administered 2021-06-15 – 2021-06-18 (×4): 0.4 mg via ORAL
  Filled 2021-06-15 (×5): qty 1

## 2021-06-15 MED ORDER — FLUTICASONE FUROATE-VILANTEROL 100-25 MCG/INH IN AEPB
1.0000 | INHALATION_SPRAY | Freq: Every day | RESPIRATORY_TRACT | Status: DC
  Filled 2021-06-15: qty 28

## 2021-06-15 MED ORDER — HYDRALAZINE HCL 20 MG/ML IJ SOLN
10.0000 mg | Freq: Four times a day (QID) | INTRAMUSCULAR | Status: DC | PRN
  Administered 2021-06-15: 10 mg via INTRAVENOUS
  Filled 2021-06-15: qty 1

## 2021-06-15 MED ORDER — ALBUTEROL SULFATE (2.5 MG/3ML) 0.083% IN NEBU
2.5000 mg | INHALATION_SOLUTION | RESPIRATORY_TRACT | Status: DC | PRN
  Administered 2021-06-16: 2.5 mg via RESPIRATORY_TRACT
  Filled 2021-06-15: qty 3

## 2021-06-15 MED ORDER — ASPIRIN EC 81 MG PO TBEC
81.0000 mg | DELAYED_RELEASE_TABLET | Freq: Every morning | ORAL | Status: DC
  Administered 2021-06-15 – 2021-06-18 (×4): 81 mg via ORAL
  Filled 2021-06-15 (×4): qty 1

## 2021-06-15 MED ORDER — NICOTINE 21 MG/24HR TD PT24
21.0000 mg | MEDICATED_PATCH | Freq: Every day | TRANSDERMAL | Status: DC
  Administered 2021-06-15 – 2021-06-18 (×4): 21 mg via TRANSDERMAL
  Filled 2021-06-15 (×4): qty 1

## 2021-06-15 MED ORDER — UMECLIDINIUM BROMIDE 62.5 MCG/INH IN AEPB
1.0000 | INHALATION_SPRAY | Freq: Every day | RESPIRATORY_TRACT | Status: DC
  Administered 2021-06-16 – 2021-06-18 (×3): 1 via RESPIRATORY_TRACT
  Filled 2021-06-15: qty 7

## 2021-06-15 MED ORDER — IPRATROPIUM BROMIDE 0.02 % IN SOLN
0.5000 mg | Freq: Once | RESPIRATORY_TRACT | Status: AC
  Administered 2021-06-15: 0.5 mg via RESPIRATORY_TRACT
  Filled 2021-06-15: qty 2.5

## 2021-06-15 MED ORDER — ADULT MULTIVITAMIN W/MINERALS CH
1.0000 | ORAL_TABLET | Freq: Every morning | ORAL | Status: DC
  Administered 2021-06-15 – 2021-06-18 (×4): 1 via ORAL
  Filled 2021-06-15 (×4): qty 1

## 2021-06-15 MED ORDER — LOSARTAN POTASSIUM 50 MG PO TABS
100.0000 mg | ORAL_TABLET | Freq: Every day | ORAL | Status: DC
  Administered 2021-06-15 – 2021-06-18 (×4): 100 mg via ORAL
  Filled 2021-06-15 (×3): qty 2
  Filled 2021-06-15: qty 4

## 2021-06-15 MED ORDER — FLUTICASONE-UMECLIDIN-VILANT 100-62.5-25 MCG/INH IN AEPB: 1.0000 | INHALATION_SPRAY | Freq: Every morning | RESPIRATORY_TRACT | Status: DC

## 2021-06-15 NOTE — H&P (Addendum)
History and Physical    [name] UUV:[id number] DOB: [birth year] DOA: 06/15/2021  PCP: Asencion Noble, MD  Patient coming from: Home  I have personally briefly reviewed patient's old medical records in [place]  Chief Complaint: Shortness of breath  HPI: [name] is a 70 y.o. female with medical history significant of chronic hypoxemic respiratory failure in the setting of COPD-on 3 L of oxygen via nasal cannula, hypertension, hyperlipidemia, CHF, arthritis, chronic back pain, depression/anxiety, tobacco abuse presented with complaint of shortness of breath since 2 days.  Patient reports worsening shortness of breath especially with exertion associated with wheezing.  Reports mild cough and chest congestion however denies sputum production, fever, chills, chest pain, leg swelling, orthopnea, PND, abdominal pain, headache, blurry vision, urinary or bowel changes.  She was given breathing treatment by EMS in route to hospital.  She continues to smoke 3 cigarettes/day however tells me that she quit 2 days ago.  Denies alcohol or any illicit drug use.  Her daughter who lives with her recently had massive stroke.  ED Course: Upon arrival to ED: Patient's blood pressure 156/67, afebrile, mildly tachypneic, ABG showed PCO2 115, BNP of 114, chest x-ray showed no signs of fluid overload or pneumonia.  Patient placed on BiPAP, was fgiven albuterol breathing treatment along with Solu-Medrol.  [facility] hospitalist consulted for admission for acute COPD exacerbation.  Review of Systems: As per HPI otherwise negative.    Past Medical History:  Diagnosis Date   Anxiety    Atrophic vaginitis 10/09/2015   Baker's cyst    left leg   Blood in urine 10/09/2015   CHF (congestive heart failure) (HCC)    COPD (chronic obstructive pulmonary disease) (HCC)    Heart murmur    Hematuria 10/09/2015   Hypertension    Nicotine addiction 04/07/2014   Pneumonia    Urinary urgency 01/25/2016    Vaginal bleeding 10/09/2015    Past Surgical History:  Procedure Laterality Date   ABDOMINAL HYSTERECTOMY     CATARACT EXTRACTION Left 03/2015   CESAREAN SECTION     COLONOSCOPY  10/19/2004   Dr. Gala Romney- internal hemorrhoids, o/w normal rectum, normal colon   COLONOSCOPY N/A 07/30/2014   KVQ:[id number] diverticulosis. Colonic polyp-removed TA. repeat TCS 07/2021   COLONOSCOPY WITH PROPOFOL N/A 03/29/2021   diverticulosis in sigmoid and descending colon, one 5 mm polyp ascending colon (adenoma), non-bleeding internal hemorrhoids. 5 year surveillance   ESOPHAGOGASTRODUODENOSCOPY N/A 07/30/2014   VFI:[id number] EGD   POLYPECTOMY  03/29/2021   Procedure: POLYPECTOMY;  Surgeon: Daneil Dolin, MD;  Location: [facility];  Service: Endoscopy;;   RADIAL HEAD ARTHROPLASTY Left 06/23/2017   Procedure: RADIAL HEAD ARTHROPLASTY WITH LIGAMENT REPAIR;  Surgeon: Meredith Pel, MD;  Location: [facility];  Service: Orthopedics;  Laterality: Left;     reports that she quit smoking about 8 years ago. Her smoking use included cigarettes. She has never used smokeless tobacco. She reports current alcohol use. She reports that she does not use drugs.  No Known Allergies  Family History  Adopted: Yes  Problem Relation Age of Onset   Cancer Sister        pancreatic   Migraines Daughter    Cancer Daughter        pre cancerous cells on cervix   Cancer Sister        breast cancer, colon   Blindness Maternal Grandfather    Other Brother        murdered  Other Sister        ruptured colon   Cancer Sister        breast, skin   Other Brother        was in MVA   Cancer Sister        pancreatic   Hypertension Sister    Other Sister        ruptured colon   Colon cancer Neg Hx     Prior to Admission medications   Medication Sig Start Date End Date Taking? Authorizing Provider  albuterol (PROVENTIL HFA;VENTOLIN HFA) 108 (90 BASE) MCG/ACT inhaler Inhale 2 puffs into the lungs every 6 (six) hours as needed for  wheezing or shortness of breath. For shortness of breath    [provider]  albuterol (PROVENTIL) (2.5 MG/3ML) 0.083% nebulizer solution Take 2.5 mg by nebulization every 6 (six) hours as needed for wheezing or shortness of breath.    [provider]  aspirin EC 81 MG tablet Take 81 mg by mouth in the morning. Swallow whole.    [provider]  atorvastatin (LIPITOR) 40 MG tablet Take 40 mg by mouth at bedtime. 09/28/20   [provider]  CARTIA XT 180 MG 24 hr capsule Take 180 mg by mouth 2 (two) times daily.  09/20/18   [provider]  furosemide (LASIX) 40 MG tablet Take 40 mg by mouth daily as needed for fluid or edema.     [provider]  HYDROcodone-acetaminophen (NORCO) 10-325 MG tablet Take 1 tablet by mouth every 4 (four) hours as needed for moderate pain. 03/02/20   [provider]  linaclotide Rolan Lipa) 290 MCG CAPS capsule Take 1 capsule (290 mcg total) by mouth daily before breakfast. 01/13/21   Erenest Rasher, PA-C  losartan-hydrochlorothiazide (HYZAAR) 100-25 MG tablet Take 1 tablet by mouth in the morning. 12/27/19   [provider]  meloxicam (MOBIC) 7.5 MG tablet Take 7.5 mg by mouth 2 (two) times daily as needed for pain. 06/07/20   [provider]  mirtazapine (REMERON) 7.5 MG tablet Take 7.5 mg by mouth at bedtime. 02/25/20   [provider]  Multiple Vitamin (MULTIVITAMIN WITH MINERALS) TABS tablet Take 1 tablet by mouth in the morning.    [provider]  naloxegol oxalate (MOVANTIK) 25 MG TABS tablet Take 1 tablet (25 mg total) by mouth daily. 1 hour before or 2 hours after breakfast. 05/27/21   Annitta Needs, NP  phenazopyridine (PYRIDIUM) 200 MG tablet Take 200 mg by mouth 3 (three) times daily as needed for pain. 06/28/20   [provider]  potassium chloride SA (KLOR-CON) 20 MEQ tablet Take 20 mEq by mouth daily as needed (fluid retention). Take with the furosemide. 06/26/18    [provider]  tamsulosin (FLOMAX) 0.4 MG CAPS capsule Take 1 capsule (0.4 mg total) by mouth 2 (two) times daily. Patient taking differently: Take 0.4 mg by mouth at bedtime. 12/09/20   McKenzie, Candee Furbish, MD  TRELEGY ELLIPTA 100-62.5-25 MCG/INH AEPB Take 1 puff by mouth in the morning. 02/19/21   [provider]    Physical Exam: Vitals:   06/15/21 0604 06/15/21 0617 06/15/21 0730 06/15/21 0805  BP:  (!) 167/71 (!) 164/73   Pulse:  89 88 83  Resp:  15 20   Temp:      TempSrc:      SpO2: 93% 94% 99%   Weight:      Height:  Constitutional: NAD, calm, comfortable on BiPAP, mildly tachypneic Eyes: PERRL, lids and conjunctivae normal ENMT: Mucous membranes are moist. Posterior pharynx clear of any exudate or lesions.Normal dentition.  Neck: normal, supple, no masses, no thyromegaly Respiratory: Diffuse expiratory wheezing noted. Cardiovascular: Regular rate and rhythm, no murmurs / rubs / gallops. No extremity edema. 2+ pedal pulses. No carotid bruits.  Abdomen: no tenderness, no masses palpated. No hepatosplenomegaly. Bowel sounds positive.  Musculoskeletal: no clubbing / cyanosis. No joint deformity upper and lower extremities. Good ROM, no contractures. Normal muscle tone.  Skin: no rashes, lesions, ulcers. No induration Neurologic: CN 2-12 grossly intact. Sensation intact, DTR normal. Strength 5/5 in all 4.  Psychiatric: Normal judgment and insight. Alert and oriented x 3. Normal mood.    Labs on Admission: I have personally reviewed following labs and imaging studies  CBC: Recent Labs  Lab 06/15/21 0620  WBC 11.5*  NEUTROABS 9.3*  HGB 11.1*  HCT 38.3  MCV 104.6*  PLT 413   Basic Metabolic Panel: Recent Labs  Lab 06/15/21 0620  NA 137  K 3.8  CL 81*  CO2 50*  GLUCOSE 126*  BUN 13  CREATININE 0.33*  CALCIUM 9.0   GFR: Estimated Creatinine Clearance: 65.2 mL/min (A) (by C-G formula based on SCr of 0.33 mg/dL (L)). Liver Function  Tests: Recent Labs  Lab 06/15/21 0620  AST 13*  ALT 14  ALKPHOS 50  BILITOT 0.3  PROT 6.9  ALBUMIN 4.0   No results for input(s): LIPASE, AMYLASE in the last 168 hours. No results for input(s): AMMONIA in the last 168 hours. Coagulation Profile: No results for input(s): INR, PROTIME in the last 168 hours. Cardiac Enzymes: No results for input(s): CKTOTAL, CKMB, CKMBINDEX, TROPONINI in the last 168 hours. BNP (last 3 results) No results for input(s): PROBNP in the last 8760 hours. HbA1C: No results for input(s): HGBA1C in the last 72 hours. CBG: No results for input(s): GLUCAP in the last 168 hours. Lipid Profile: No results for input(s): CHOL, HDL, LDLCALC, TRIG, CHOLHDL, LDLDIRECT in the last 72 hours. Thyroid Function Tests: No results for input(s): TSH, T4TOTAL, FREET4, T3FREE, THYROIDAB in the last 72 hours. Anemia Panel: No results for input(s): VITAMINB12, FOLATE, FERRITIN, TIBC, IRON, RETICCTPCT in the last 72 hours. Urine analysis:    Component Value Date/Time   COLORURINE YELLOW 06/24/2020 2120   APPEARANCEUR Clear 03/02/2021 1629   LABSPEC 1.012 06/24/2020 2120   PHURINE 6.0 06/24/2020 2120   GLUCOSEU Negative 03/02/2021 1629   HGBUR NEGATIVE 06/24/2020 2120   BILIRUBINUR Negative 03/02/2021 1629   KETONESUR NEGATIVE 06/24/2020 2120   PROTEINUR Trace (A) 03/02/2021 1629   PROTEINUR NEGATIVE 06/24/2020 2120   UROBILINOGEN 0.2 08/17/2011 1302   NITRITE Negative 03/02/2021 1629   NITRITE NEGATIVE 06/24/2020 2120   LEUKOCYTESUR Trace (A) 03/02/2021 1629   LEUKOCYTESUR NEGATIVE 06/24/2020 2120    Radiological Exams on Admission: DG Chest Port 1 View  Result Date: 06/15/2021 CLINICAL DATA:  Worsening shortness of breath. Weakness. History of COPD. EXAM: PORTABLE CHEST 1 VIEW COMPARISON:  Chest 06/24/2020, 11/24/2014. FINDINGS: Mediastinum hilar structures normal. Cardiomegaly, no pulmonary venous congestion. Persistent right base pleural-parenchymal thickening  consistent with scarring. No interim change. No acute infiltrate. No pleural effusion or pneumothorax. Postsurgical changes noted over the neck. Thoracic spine scoliosis again noted. IMPRESSION: 1.  Cardiomegaly, no pulmonary venous congestion. 2. Persistent right base pleural-parenchymal thickening consistent with scarring. No acute infiltrate. Electronically Signed   By: Marcello Moores  Register   On: 06/15/2021 06:32  EKG: Independently reviewed.  Normal sinus rhythm, no acute ST-T wave changes noted.  assessment/Plan Active Problems:   Nicotine addiction   GERD (gastroesophageal reflux disease)   Macrocytic anemia   COPD exacerbation (HCC)   Hypertension   Anxiety   CHF (congestive heart failure) (HCC)    Acute on chronic hypoxic and hypercarbic respiratory failure in the setting of acute exacerbation of COPD: -Patient has history of COPD-on 3 L of oxygen with current tobacco use.  Presented with shortness of breath and wheezing.  Symptoms improved with breathing treatment and steroids -ABG shows PCO2 of 115.  No signs of fluid overload.  BNP: 114.  Chest x-ray negative for fluid overload or pneumonia.  COVID-19 negative. -She is afebrile with leukocytosis of 11.5 -Patient placed on BiPAP and continuous breathing treatment. -Started on Doxy for atypical coverage.  Continue IV Solu-Medrol -On continuous pulse ox.  -We will repeat ABGs and monitor her closely  Hypertension: Blood pressure is elevated -We will resume her home medication losartan-HCTZ, Cartia 180 mg once daily and added hydralazine as needed.  Continue to monitor blood pressure closely  History of CHF (unknown type): Patient appears euvolemic -No echo in the system.  BNP: 114.  Chest x-ray negative for fluid overload -On Lasix 40 mg once daily at home.  We will continue same -Continue aspirin, statin -Strict INO's and daily weight. -Monitor electrolytes  Chronic back pain: Continue home meds hydrocodone-acetaminophen as  needed  Depression/anxiety: Continue home meds mirtazapine  Hyperlipidemia: Continue atorvastatin  Chronic urinary issues/retention:  -Followed by urology outpatient.  Continue Flomax  Macrocytic anemia: -H&H 11.1/38.3, MCV 104.6.  H&H is stable -We will check B12, folate  Tobacco abuse: Counseled about cessation -We will add nicotine patch  History of chronic constipation and colonic adenoma: -Followed by GI outpatient.  Continue Linzess and MiraLAX.  DVT prophylaxis: Lovenox/SCD Code Status: Full code Family Communication: None present at bedside.  Plan of care discussed with patient in length and she verbalized understanding and agreed with it. Patient's daughter and discussed plan of care and she verbalized understanding Disposition Plan: Home Consults called: None Admission status: Inpatient   Mckinley Jewel MD [facility]  If 7PM-7AM, please contact night-coverage [email or web address]  06/15/2021, 8:25 AM

## 2021-06-15 NOTE — Evaluation (Signed)
Physical Therapy Evaluation Patient Details Name: [name] MRN: [id number] DOB: Apr 05, 1951 Today's Date: 06/15/2021   History of Present Illness  Patient is a 70 y.o. female presented with SOB for 2-3 days, pt admitted for COPD exacerbation. PMH significant for chronic hypoxemic respiratory failure with COPD-on 3 L of oxygen at baseline,HTN, HLD, CHF, arthritis, chronic back pain, depression/anxiety, tobacco abuse.    Clinical Impression  [name] is 70 y.o. female admitted with above HPI and diagnosis. Patient is currently limited by functional impairments below (see PT problem list). Patient lives with her daughter who is recovering from a massive stroke but is independent with mobility. Patient is independent with mobility and ADL's with 3L/min O2 at baseline. Currently pt requires min guard for safety with transfers and gait with no device. Pt desats to 88% on 3L and up to 90% SpO2 with 4L/min. Patient ambulated to bathroom and short distance in room demonstrating ability to push portable O2 tank with min guard before fatiguing and requiring seated rest break. Patient will benefit from continued skilled PT interventions to address impairments and progress independence with mobility, recommending HHPT at this time vs no follow up with progress in acute setting. Acute PT will follow and progress as able.     Follow Up Recommendations Home health PT    Equipment Recommendations  None recommended by PT    Recommendations for Other Services       Precautions / Restrictions Precautions Precautions: Fall Precaution Comments: watch sats Restrictions Weight Bearing Restrictions: No      Mobility  Bed Mobility Overal bed mobility: Needs Assistance Bed Mobility: Supine to Sit     Supine to sit: Min guard;HOB elevated;Supervision     General bed mobility comments: guarding/supervision for safety, no assist or cues needed    Transfers Overall transfer level: Needs  assistance Equipment used: None Transfers: Sit to/from Stand Sit to Stand: Min guard         General transfer comment: guarding for safety, pt able to rise from EOB and complete Sit<>Stand at toilet. no cues needed for power up.  Ambulation/Gait Ambulation/Gait assistance: Min guard Gait Distance (Feet): 30 Feet (2x15 in room) Assistive device: None Gait Pattern/deviations: Step-through pattern;Shuffle Gait velocity: decr   General Gait Details: guarding for safety with short bout of gait to bathroom and back to recliner. no overt LOB noted. pt sats 88% on 3L/min and up to 90% on 4L/min. pt too fatigued to ambulate in hallway.  Stairs            Wheelchair Mobility    Modified Rankin (Stroke Patients Only)       Balance Overall balance assessment: Needs assistance Sitting-balance support: Feet supported Sitting balance-Leahy Scale: Good     Standing balance support: During functional activity;Bilateral upper extremity supported Standing balance-Leahy Scale: Fair                               Pertinent Vitals/Pain Pain Assessment: Faces Faces Pain Scale: Hurts little more Pain Location: general, back, legs, arms Pain Descriptors / Indicators: Aching;Discomfort Pain Intervention(s): Limited activity within patient's tolerance;Monitored during session;Repositioned    Home Living Family/patient expects to be discharged to:: Private residence Living Arrangements: Children Available Help at Discharge: Family Type of Home: House Home Access: Stairs to enter Entrance Stairs-Rails: Psychiatric nurse of Steps: 6-8 at front Home Layout: One level Home Equipment: Environmental consultant - 2 wheels  Prior Function Level of Independence: Independent               Hand Dominance   Dominant Hand: Right    Extremity/Trunk Assessment   Upper Extremity Assessment Upper Extremity Assessment: Overall WFL for tasks assessed    Lower Extremity  Assessment Lower Extremity Assessment: Overall WFL for tasks assessed    Cervical / Trunk Assessment Cervical / Trunk Assessment: Normal  Communication   Communication: No difficulties  Cognition Arousal/Alertness: Awake/alert Behavior During Therapy: WFL for tasks assessed/performed Overall Cognitive Status: Within Functional Limits for tasks assessed                                        General Comments      Exercises     Assessment/Plan    PT Assessment Patient needs continued PT services  PT Problem List Decreased strength;Decreased range of motion;Decreased balance;Decreased activity tolerance;Decreased mobility;Decreased knowledge of use of DME       PT Treatment Interventions DME instruction;Gait training;Stair training;Functional mobility training;Therapeutic activities;Therapeutic exercise;Balance training;Patient/family education    PT Goals (Current goals can be found in the Care Plan section)  Acute Rehab PT Goals Patient Stated Goal: get back to normal independence PT Goal Formulation: With patient Time For Goal Achievement: 06/29/21 Potential to Achieve Goals: Good    Frequency 7X/week   Barriers to discharge        Co-evaluation               AM-PAC PT "6 Clicks" Mobility  Outcome Measure Help needed turning from your back to your side while in a flat bed without using bedrails?: None Help needed moving from lying on your back to sitting on the side of a flat bed without using bedrails?: A Little Help needed moving to and from a bed to a chair (including a wheelchair)?: A Little Help needed standing up from a chair using your arms (e.g., wheelchair or bedside chair)?: A Little Help needed to walk in hospital room?: A Little Help needed climbing 3-5 steps with a railing? : A Little 6 Click Score: 19    End of Session Equipment Utilized During Treatment: Gait belt Activity Tolerance: Patient tolerated treatment well Patient  left: in chair;with call bell/phone within reach Nurse Communication: Mobility status PT Visit Diagnosis: Muscle weakness (generalized) (M62.81);Difficulty in walking, not elsewhere classified (R26.2)    Time: 2876-8115 PT Time Calculation (min) (ACUTE ONLY): 33 min   Charges:   PT Evaluation $PT Eval Low Complexity: 1 Low PT Treatments $Gait Training: 8-22 mins        Verner Mould, DPT Acute Rehabilitation Services Office [phone] Pager [phone]   Jacques Navy 06/15/2021, 4:39 PM

## 2021-06-15 NOTE — ED Provider Notes (Signed)
[facility] DEPT Provider Note  CSN: [id number] Arrival date & time: 06/15/21 0425  Chief Complaint(s) Shortness of Breath  HPI [name] is a 70 y.o. female with COPD on 3LNC   Shortness of Breath Severity:  Moderate Onset quality:  Gradual Duration:  2 days Timing:  Constant Progression:  Worsening Chronicity:  Chronic Relieved by: Nebs given by EMS. Worsened by:  Activity Associated symptoms: wheezing   Associated symptoms: no chest pain, no cough, no fever, no sputum production and no vomiting    Past Medical History Past Medical History:  Diagnosis Date   Anxiety    Atrophic vaginitis 10/09/2015   Baker's cyst    left leg   Blood in urine 10/09/2015   CHF (congestive heart failure) (HCC)    COPD (chronic obstructive pulmonary disease) (HCC)    Heart murmur    Hematuria 10/09/2015   Hypertension    Nicotine addiction 04/07/2014   Pneumonia    Urinary urgency 01/25/2016   Vaginal bleeding 10/09/2015   Patient Active Problem List   Diagnosis Date Noted   Therapeutic opioid-induced constipation (OIC) 05/27/2021   Urinary retention 07/01/2020   Hematochezia 11/09/2018   Presence of left artificial elbow joint 06/29/2017   Closed displaced fracture of head of radius with routine healing 06/29/2017   Urinary urgency 01/25/2016   Blood in urine 10/09/2015   Vaginal bleeding 10/09/2015   Atrophic vaginitis 10/09/2015   Hematuria 10/09/2015   GERD (gastroesophageal reflux disease) 09/04/2014   Anemia 09/04/2014   Constipation 07/14/2014   Nicotine addiction 04/07/2014   Home Medication(s) Prior to Admission medications   Medication Sig Start Date End Date Taking? Authorizing Provider  albuterol (PROVENTIL HFA;VENTOLIN HFA) 108 (90 BASE) MCG/ACT inhaler Inhale 2 puffs into the lungs every 6 (six) hours as needed for wheezing or shortness of breath. For shortness of breath    [provider]  albuterol (PROVENTIL)  (2.5 MG/3ML) 0.083% nebulizer solution Take 2.5 mg by nebulization every 6 (six) hours as needed for wheezing or shortness of breath.    [provider]  aspirin EC 81 MG tablet Take 81 mg by mouth in the morning. Swallow whole.    [provider]  atorvastatin (LIPITOR) 40 MG tablet Take 40 mg by mouth at bedtime. 09/28/20   [provider]  CARTIA XT 180 MG 24 hr capsule Take 180 mg by mouth 2 (two) times daily.  09/20/18   [provider]  furosemide (LASIX) 40 MG tablet Take 40 mg by mouth daily as needed for fluid or edema.     [provider]  HYDROcodone-acetaminophen (NORCO) 10-325 MG tablet Take 1 tablet by mouth every 4 (four) hours as needed for moderate pain. 03/02/20   [provider]  linaclotide Rolan Lipa) 290 MCG CAPS capsule Take 1 capsule (290 mcg total) by mouth daily before breakfast. 01/13/21   Erenest Rasher, PA-C  losartan-hydrochlorothiazide (HYZAAR) 100-25 MG tablet Take 1 tablet by mouth in the morning. 12/27/19   [provider]  meloxicam (MOBIC) 7.5 MG tablet Take 7.5 mg by mouth 2 (two) times daily as needed for pain. 06/07/20   [provider]  mirtazapine (REMERON) 7.5 MG tablet Take 7.5 mg by mouth at bedtime. 02/25/20   [provider]  Multiple Vitamin (MULTIVITAMIN WITH MINERALS) TABS tablet Take 1 tablet by mouth in the morning.    [provider]  naloxegol oxalate (MOVANTIK) 25 MG TABS tablet Take 1 tablet (25 mg total)  by mouth daily. 1 hour before or 2 hours after breakfast. 05/27/21   Annitta Needs, NP  phenazopyridine (PYRIDIUM) 200 MG tablet Take 200 mg by mouth 3 (three) times daily as needed for pain. 06/28/20   [provider]  potassium chloride SA (KLOR-CON) 20 MEQ tablet Take 20 mEq by mouth daily as needed (fluid retention). Take with the furosemide. 06/26/18   [provider]  tamsulosin (FLOMAX) 0.4 MG CAPS capsule Take 1 capsule (0.4 mg total) by mouth 2  (two) times daily. Patient taking differently: Take 0.4 mg by mouth at bedtime. 12/09/20   McKenzie, Candee Furbish, MD  TRELEGY ELLIPTA 100-62.5-25 MCG/INH AEPB Take 1 puff by mouth in the morning. 02/19/21   [provider]                                                                                                                                    Past Surgical History Past Surgical History:  Procedure Laterality Date   ABDOMINAL HYSTERECTOMY     CATARACT EXTRACTION Left 03/2015   CESAREAN SECTION     COLONOSCOPY  10/19/2004   Dr. Gala Romney- internal hemorrhoids, o/w normal rectum, normal colon   COLONOSCOPY N/A 07/30/2014   [id number] diverticulosis. Colonic polyp-removed TA. repeat TCS 07/2021   COLONOSCOPY WITH PROPOFOL N/A 03/29/2021   diverticulosis in sigmoid and descending colon, one 5 mm polyp ascending colon (adenoma), non-bleeding internal hemorrhoids. 5 year surveillance   ESOPHAGOGASTRODUODENOSCOPY N/A 07/30/2014   [id number] EGD   POLYPECTOMY  03/29/2021   Procedure: POLYPECTOMY;  Surgeon: Daneil Dolin, MD;  Location: [facility];  Service: Endoscopy;;   RADIAL HEAD ARTHROPLASTY Left 06/23/2017   Procedure: RADIAL HEAD ARTHROPLASTY WITH LIGAMENT REPAIR;  Surgeon: Meredith Pel, MD;  Location: [facility];  Service: Orthopedics;  Laterality: Left;   Family History Family History  Adopted: Yes  Problem Relation Age of Onset   Cancer Sister        pancreatic   Migraines Daughter    Cancer Daughter        pre cancerous cells on cervix   Cancer Sister        breast cancer, colon   Blindness Maternal Grandfather    Other Brother        murdered   Other Sister        ruptured colon   Cancer Sister        breast, skin   Other Brother        was in MVA   Cancer Sister        pancreatic   Hypertension Sister    Other Sister        ruptured colon   Colon cancer Neg Hx     Social History Social History   Tobacco Use   Smoking status: Former    Years: 40.00     Pack years: 0.00    Types: Cigarettes  Quit date: 04/14/2013    Years since quitting: 8.1   Smokeless tobacco: Never  Substance Use Topics   Alcohol use: Yes    Comment: glass of wine maybe once a year   Drug use: No   Allergies Patient has no known allergies.  Review of Systems Review of Systems  Constitutional:  Positive for fatigue. Negative for fever.  Respiratory:  Positive for shortness of breath and wheezing. Negative for cough and sputum production.   Cardiovascular:  Negative for chest pain.  Gastrointestinal:  Negative for vomiting.  All other systems are reviewed and are negative for acute change except as noted in the HPI  Physical Exam Vital Signs  I have reviewed the triage vital signs BP (!) 156/67 (BP Location: Right Arm)   Pulse 100   Temp 98.7 F (37.1 C) (Oral)   Resp 18   Ht 5\' 3"  (1.6 m)   Wt 72.6 kg   SpO2 96%   BMI 28.34 kg/m   Physical Exam Vitals reviewed.  Constitutional:      General: She is not in acute distress.    Appearance: She is well-developed. She is not diaphoretic.  HENT:     Head: Normocephalic and atraumatic.     Nose: Nose normal.  Eyes:     General: No scleral icterus.       Right eye: No discharge.        Left eye: No discharge.     Conjunctiva/sclera: Conjunctivae normal.     Pupils: Pupils are equal, round, and reactive to light.  Cardiovascular:     Rate and Rhythm: Normal rate and regular rhythm.     Heart sounds: No murmur heard.   No friction rub. No gallop.  Pulmonary:     Effort: Tachypnea, prolonged expiration and respiratory distress present.     Breath sounds: Decreased air movement present. No stridor. Wheezing (faint exp, diffuse) present. No rales.  Abdominal:     General: There is no distension.     Palpations: Abdomen is soft.     Tenderness: There is no abdominal tenderness.  Musculoskeletal:        General: No tenderness.     Cervical back: Normal range of motion and neck supple.  Skin:     General: Skin is warm and dry.     Findings: No erythema or rash.  Neurological:     Mental Status: She is alert and oriented to person, place, and time.    ED Results and Treatments Labs (all labs ordered are listed, but only abnormal results are displayed) Labs Reviewed  BLOOD GAS, VENOUS - Abnormal; Notable for the following components:      Result Value   pCO2, Ven 115 (*)    Bicarbonate 52.8 (*)    Acid-Base Excess 21.9 (*)    All other components within normal limits  CBC WITH DIFFERENTIAL/PLATELET - Abnormal; Notable for the following components:   WBC 11.5 (*)    RBC 3.66 (*)    Hemoglobin 11.1 (*)    MCV 104.6 (*)    MCHC 29.0 (*)    Neutro Abs 9.3 (*)    All other components within normal limits  BRAIN NATRIURETIC PEPTIDE - Abnormal; Notable for the following components:   B Natriuretic Peptide 114.3 (*)    All other components within normal limits  COMPREHENSIVE METABOLIC PANEL - Abnormal; Notable for the following components:   Chloride 81 (*)    CO2 50 (*)    Glucose,  Bld 126 (*)    Creatinine, Ser 0.33 (*)    AST 13 (*)    All other components within normal limits  RESP PANEL BY RT-PCR (FLU A&B, COVID) [id number]                                                                                                                         EKG  EKG Interpretation  Date/Time:  Tuesday June 15 2021 06:19:01 EDT Ventricular Rate:  90 PR Interval:  128 QRS Duration: 87 QT Interval:  372 QTC Calculation: 456 R Axis:   59 Text Interpretation: Sinus rhythm Probable left atrial enlargement Anteroseptal infarct, old No significant change since last tracing Confirmed by Addison Lank [phone]) on 06/15/2021 6:54:49 AM        Radiology DG Chest Port 1 View  Result Date: 06/15/2021 CLINICAL DATA:  Worsening shortness of breath. Weakness. History of COPD. EXAM: PORTABLE CHEST 1 VIEW COMPARISON:  Chest 06/24/2020, 11/24/2014. FINDINGS: Mediastinum hilar structures normal.  Cardiomegaly, no pulmonary venous congestion. Persistent right base pleural-parenchymal thickening consistent with scarring. No interim change. No acute infiltrate. No pleural effusion or pneumothorax. Postsurgical changes noted over the neck. Thoracic spine scoliosis again noted. IMPRESSION: 1.  Cardiomegaly, no pulmonary venous congestion. 2. Persistent right base pleural-parenchymal thickening consistent with scarring. No acute infiltrate. Electronically Signed   By: Marcello Moores  Register   On: 06/15/2021 06:32    Pertinent labs & imaging results that were available during my care of the patient were reviewed by me and considered in my medical decision making (see chart for details).  Medications Ordered in ED Medications  albuterol (PROVENTIL,VENTOLIN) solution continuous neb (0 mg/hr Nebulization Hold 06/15/21 0647)  ipratropium (ATROVENT) nebulizer solution 0.5 mg (0.5 mg Nebulization Given 06/15/21 0604)  methylPREDNISolone sodium succinate (SOLU-MEDROL) 125 mg/2 mL injection 125 mg (125 mg Intravenous Given 06/15/21 0610)                                                                                                                                    Procedures .1-3 Lead EKG Interpretation  Date/Time: 06/15/2021 5:57 AM Performed by: Fatima Blank, MD Authorized by: Fatima Blank, MD     Interpretation: normal     ECG rate:  98   ECG rate assessment: normal     Rhythm: sinus rhythm     Ectopy: none     Conduction: normal   .  Critical Care  Date/Time: 06/15/2021 5:57 AM Performed by: Fatima Blank, MD Authorized by: Fatima Blank, MD   Critical care provider statement:    Critical care time (minutes):  45   Critical care was necessary to treat or prevent imminent or life-threatening deterioration of the following conditions:  Respiratory failure   Critical care was time spent personally by me on the following activities:  Discussions with consultants,  evaluation of patient's response to treatment, examination of patient, ordering and performing treatments and interventions, ordering and review of laboratory studies, ordering and review of radiographic studies, pulse oximetry, re-evaluation of patient's condition, obtaining history from patient or surrogate and review of old charts  (including critical care time)  Medical Decision Making / ED Course I have reviewed the nursing notes for this encounter and the patient's prior records (if available in EHR or on provided paperwork).   [name] was evaluated in Emergency Department on 06/15/2021 for the symptoms described in the history of present illness. She was evaluated in the context of the global COVID-19 pandemic, which necessitated consideration that the patient might be at risk for infection with the SARS-CoV-2 virus that causes COVID-19. Institutional protocols and algorithms that pertain to the evaluation of patients at risk for COVID-19 are in a state of rapid change based on information released by regulatory bodies including the CDC and federal and state organizations. These policies and algorithms were followed during the patient's care in the ED.  Gradual SOB for 2 days. H/o COPD and CHF. Does not appear to be overtly volume overloaded. Lung exam is consistent with COPD. Satting well on her home O2, but has increased WOB. Will treat as COPD exacerbation.  Labs to rule out CHF, anemia. Will assess for PNA, PTx. Doubt PE.  CXR w/o PNA, PTx, or Pulmonary edema.  Work-up not consistent with HF. Notable for severe hypercapnia.  Patient is compensating but given her increased shortness of breath and fatigue, will place patient on BiPAP and admit to the hospitalist.      Final Clinical Impression(s) / ED Diagnoses Final diagnoses:  SOB (shortness of breath)  COPD exacerbation ([place])  Acute on chronic respiratory failure with hypercapnia ([place])      This chart was  dictated using voice recognition software.  Despite best efforts to proofread,  errors can occur which can change the documentation meaning.    Fatima Blank, MD 06/15/21 0730

## 2021-06-15 NOTE — ED Notes (Signed)
Declines vitals until she is off the phone with her daughter. States, "do it after."

## 2021-06-15 NOTE — ED Notes (Signed)
Pt is refusing vitals until I can get her a phone to call her daughter

## 2021-06-15 NOTE — ED Triage Notes (Addendum)
Pt BIB [facility] from home. Pt reports SOB, weakness, and shakiness that worsened today. Hx COPD. Also reports frequent urination, but she is adamant that she does not have a UTI. A&Ox4. Was given a neb en route with improvement in SOB.

## 2021-06-15 NOTE — ED Notes (Signed)
Pt ambulated to the bathroom with minimal assistance

## 2021-06-15 NOTE — Progress Notes (Signed)
OT Cancellation Note  Patient Details Name: HELVI ROYALS MRN: [id number] DOB: 05-24-1951   Cancelled Treatment:    Reason Eval/Treat Not Completed: Other (comment) Upon arrival patient on bipap and states she will be going to floor soon. Still has another 20 minutes on mask. Will check back 6/22 as schedule permits.  Delbert Phenix OT OT pager: [phone]'  Rosemary Holms 06/15/2021, 11:27 AM

## 2021-06-16 DIAGNOSIS — J441 Chronic obstructive pulmonary disease with (acute) exacerbation: Principal | ICD-10-CM

## 2021-06-16 DIAGNOSIS — D539 Nutritional anemia, unspecified: Secondary | ICD-10-CM

## 2021-06-16 DIAGNOSIS — I159 Secondary hypertension, unspecified: Secondary | ICD-10-CM

## 2021-06-16 LAB — BASIC METABOLIC PANEL
Anion gap: 7 (ref 5–15)
BUN: 15 mg/dL (ref 8–23)
CO2: 50 mmol/L — ABNORMAL HIGH (ref 22–32)
Calcium: 8.9 mg/dL (ref 8.9–10.3)
Chloride: 82 mmol/L — ABNORMAL LOW (ref 98–111)
Creatinine, Ser: 0.49 mg/dL (ref 0.44–1.00)
GFR, Estimated: >60 mL/min (ref >60)
Glucose, Bld: 149 mg/dL — ABNORMAL HIGH (ref 70–99)
Potassium: 3.7 mmol/L (ref 3.5–5.1)
Sodium: 139 mmol/L (ref 135–145)

## 2021-06-16 LAB — CBC
HCT: 38.5 % (ref 36.0–46.0)
Hemoglobin: 11.4 g/dL — ABNORMAL LOW (ref 12.0–15.0)
MCH: 29.9 pg (ref 26.0–34.0)
MCHC: 29.6 g/dL — ABNORMAL LOW (ref 30.0–36.0)
MCV: 101 fL — ABNORMAL HIGH (ref 80.0–100.0)
Platelets: 189 10*3/uL (ref 150–400)
RBC: 3.81 MIL/uL — ABNORMAL LOW (ref 3.87–5.11)
RDW: 12.2 % (ref 11.5–15.5)
WBC: 8.9 10*3/uL (ref 4.0–10.5)
nRBC: 0 % (ref 0.0–0.2)

## 2021-06-16 MED ORDER — ALBUTEROL SULFATE (2.5 MG/3ML) 0.083% IN NEBU
2.5000 mg | INHALATION_SOLUTION | Freq: Four times a day (QID) | RESPIRATORY_TRACT | Status: DC
  Administered 2021-06-17 – 2021-06-18 (×5): 2.5 mg via RESPIRATORY_TRACT
  Filled 2021-06-16 (×5): qty 3

## 2021-06-16 MED ORDER — VITAMIN B-12 1000 MCG PO TABS
1000.0000 ug | ORAL_TABLET | Freq: Every day | ORAL | Status: DC
  Administered 2021-06-16 – 2021-06-18 (×3): 1000 ug via ORAL
  Filled 2021-06-16 (×4): qty 1

## 2021-06-16 MED ORDER — HYDRALAZINE HCL 25 MG PO TABS
25.0000 mg | ORAL_TABLET | ORAL | Status: DC | PRN
  Administered 2021-06-17: 25 mg via ORAL
  Filled 2021-06-16: qty 1

## 2021-06-16 MED ORDER — ALBUTEROL SULFATE (2.5 MG/3ML) 0.083% IN NEBU: 2.5000 mg | INHALATION_SOLUTION | Freq: Four times a day (QID) | RESPIRATORY_TRACT | Status: DC | PRN

## 2021-06-16 MED ORDER — LABETALOL HCL 5 MG/ML IV SOLN: 10.0000 mg | INTRAVENOUS | Status: DC | PRN

## 2021-06-16 NOTE — Progress Notes (Signed)
Nutrition Note  Nutrition Assessment consult received   Pt with PMH of COPD on 3 L O2 at home, continues to smoke, HTN, HLD, CHF, arthritis, chronic back pain, depression/anxiety, and tobacco abuse admitted for COPD exacerbation after 2-3 days of SOB.  Per chart review pt with fluctuating weights 72-76 kg.   Per nutrition screen pt with no recent weight loss but has had a decreased appetite PTA.  Noted pt SOB 2-3 days PTA likely contributing.  Per documentation pt eating >90% of her last 2 meals.   Medications reviewed and include: remeron, MVI with minerals, prednisone  Labs reviewed   Wt Readings from Last 3 Encounters:  06/15/21 72.6 kg  03/25/21 71.2 kg  12/11/20 76.2 kg    Body mass index is 28.34 kg/m.   Current diet order is 2 gm Sodium, patient is consuming approximately 90-100% of meals at this time. Labs and medications reviewed.   No nutrition interventions warranted at this time. If nutrition issues arise, please consult RD.  Continue to encourage low sodium diet at discharge.   Lockie Pares., RD, LDN, CNSC See [facility] for contact information

## 2021-06-16 NOTE — Progress Notes (Signed)
Progress Note    [name]   OIN:[id number]  DOB: 04/12/1951  DOA: 06/15/2021     1  PCP: Asencion Noble, MD  CC: Shortness of breath, cough  Hospital Course: [name] is a 70 y.o. female with medical history significant of chronic hypoxemic respiratory failure in the setting of COPD-on 3 L of oxygen via nasal cannula, hypertension, hyperlipidemia, CHF, arthritis, chronic back pain, depression/anxiety, tobacco abuse presented with complaint of shortness of breath since 2 days.   Patient reports worsening shortness of breath especially with exertion associated with wheezing.  Reports mild cough and chest congestion however denies sputum production, fever, chills, chest pain, leg swelling, orthopnea, PND, abdominal pain, headache, blurry vision, urinary or bowel changes.  She was given breathing treatment by EMS in route to hospital.  She continues to smoke 3 cigarettes/day however states that she quit 2 days ago.  Denies alcohol or any illicit drug use.  Her daughter who lives with her recently had massive stroke.   ED Course: Upon arrival to ED: Patient's blood pressure 156/67, afebrile, mildly tachypneic, ABG showed PCO2 115, BNP of 114, chest x-ray showed no signs of fluid overload or pneumonia.  Patient placed on BiPAP, was fgiven albuterol breathing treatment along with Solu-Medrol.  [facility] hospitalist consulted for admission for acute COPD exacerbation.  Interval History:  No events overnight but still having shortness of breath this morning and tightness in her chest with breathing.  Does feel a difference after having been started on steroids, breathing treatments.  Hoping to feel further improved and able to go home tomorrow she says.  ROS: Constitutional: positive for fatigue and malaise, negative for chills and fevers, Respiratory: positive for cough, dyspnea on exertion, and pleurisy/chest pain, negative for hemoptysis and stridor, Cardiovascular: negative for chest  pain, and Gastrointestinal: negative for abdominal pain  Assessment & Plan: Acute on chronic hypoxic and hypercarbic respiratory failure in the setting of acute exacerbation of COPD: -Patient has history of COPD-on 3 L of oxygen with current tobacco use.  Presented with shortness of breath and wheezing.  Symptoms improved with breathing treatment and steroids -ABG shows PCO2 of 115.  No signs of fluid overload.  BNP: 114.  Chest x-ray negative for fluid overload or pneumonia.  COVID-19 negative. -She is afebrile with leukocytosis of 11.5 -Still tight breath sounds with decreased air movement on 06/16/2021 - Continue steroids, breathing treatments   Hypertension -Continue current regimen   History of CHF (unknown type): Patient appears euvolemic -No echo in the system.  BNP: 114.  Chest x-ray negative for fluid overload -On Lasix 40 mg once daily at home.  We will continue same -Continue aspirin, statin -Strict I&O's and daily weight. -Monitor electrolytes   Chronic back pain: Continue home meds hydrocodone-acetaminophen as needed   Depression/anxiety: Continue home meds mirtazapine   Hyperlipidemia: Continue atorvastatin   Chronic urinary issues/retention:  -Followed by urology outpatient.  Continue Flomax   Macrocytic anemia: -H&H 11.1/38.3, MCV 104.6.  H&H is stable -We will check B12 (341), folate (45) - start oral B12   Tobacco abuse: Counseled about cessation -We will add nicotine patch   History of chronic constipation and colonic adenoma: -Followed by GI outpatient.  Continue Linzess and MiraLAX.  Old records reviewed in assessment of this patient  Antimicrobials: Doxy 6/21 >> current  DVT prophylaxis: enoxaparin (LOVENOX) injection 40 mg Start: 06/15/21 2200 SCDs Start: 06/15/21 0759   Code Status:   Code Status: Full Code Family Communication:  Disposition Plan: Status is: Inpatient  Remains inpatient appropriate because:Inpatient level of care  appropriate due to severity of illness and ongoing SOB and difficulty with work of breathing  Dispo: The patient is from: Home              Anticipated d/c is to: Home              Patient currently is not medically stable to d/c.   Difficult to place patient No   Risk of unplanned readmission score: Unplanned Admission- Pilot do not use: 13.22   Objective: Blood pressure (!) 155/70, pulse 87, temperature 98.9 F (37.2 C), temperature source Oral, resp. rate 20, height 5\' 3"  (1.6 m), weight 72.6 kg, SpO2 95 %.  Examination: General appearance: alert, cooperative, and no distress Head: Normocephalic, without obvious abnormality, atraumatic Eyes:  No movement of right eye due to prior surgical damage.  Left eye vision and movements intact Lungs:  Tight breath sounds with decreased air movement and expiratory wheezing Heart: regular rate and rhythm and S1, S2 normal Abdomen: normal findings: bowel sounds normal and soft, non-tender Extremities:  No edema Skin:  Warm, dry, intact Neurologic: Grossly normal  Consultants:    Procedures:    Data Reviewed: I have personally reviewed following labs and imaging studies Results for orders placed or performed during the hospital encounter of 06/15/21 (from the past 24 hour(s))  Vitamin B12     Status: None   Collection Time: 06/15/21  3:14 PM  Result Value Ref Range   Vitamin B-12 341 180 - 914 pg/mL  Folate, serum, performed at [facility] lab     Status: None   Collection Time: 06/15/21  3:14 PM  Result Value Ref Range   Folate 45.6 >5.9 ng/mL  CBC     Status: Abnormal   Collection Time: 06/16/21  3:38 AM  Result Value Ref Range   WBC 8.9 4.0 - 10.5 K/uL   RBC 3.81 (L) 3.87 - 5.11 MIL/uL   Hemoglobin 11.4 (L) 12.0 - 15.0 g/dL   HCT 38.5 36.0 - 46.0 %   MCV 101.0 (H) 80.0 - 100.0 fL   MCH 29.9 26.0 - 34.0 pg   MCHC 29.6 (L) 30.0 - 36.0 g/dL   RDW 12.2 11.5 - 15.5 %   Platelets 189 150 - 400 K/uL   nRBC 0.0 0.0 - 0.2 %  Basic  metabolic panel     Status: Abnormal   Collection Time: 06/16/21  3:38 AM  Result Value Ref Range   Sodium 139 135 - 145 mmol/L   Potassium 3.7 3.5 - 5.1 mmol/L   Chloride 82 (L) 98 - 111 mmol/L   CO2 50 (H) 22 - 32 mmol/L   Glucose, Bld 149 (H) 70 - 99 mg/dL   BUN 15 8 - 23 mg/dL   Creatinine, Ser 0.49 0.44 - 1.00 mg/dL   Calcium 8.9 8.9 - 10.3 mg/dL   GFR, Estimated >60 >60 mL/min   Anion gap 7 5 - 15    Recent Results (from the past 240 hour(s))  Resp Panel by RT-PCR (Flu A&B, Covid) Nasopharyngeal Swab     Status: None   Collection Time: 06/15/21  6:03 AM   Specimen: Nasopharyngeal Swab; Nasopharyngeal(NP) swabs in vial transport medium  Result Value Ref Range Status   SARS Coronavirus 2 by RT PCR NEGATIVE NEGATIVE Final    Comment: (NOTE) SARS-CoV-2 target nucleic acids are NOT DETECTED.  The SARS-CoV-2 RNA is generally detectable in upper  respiratory specimens during the acute phase of infection. The lowest concentration of SARS-CoV-2 viral copies this assay can detect is 138 copies/mL. A negative result does not preclude SARS-Cov-2 infection and should not be used as the sole basis for treatment or other patient management decisions. A negative result may occur with  improper specimen collection/handling, submission of specimen other than nasopharyngeal swab, presence of viral mutation(s) within the areas targeted by this assay, and inadequate number of viral copies(<138 copies/mL). A negative result must be combined with clinical observations, patient history, and epidemiological information. The expected result is Negative.  Fact Sheet for Patients:  EntrepreneurPulse.com.au  Fact Sheet for Healthcare Providers:  IncredibleEmployment.be  This test is no t yet approved or cleared by the [place] FDA and  has been authorized for detection and/or diagnosis of SARS-CoV-2 by FDA under an Emergency Use Authorization (EUA). This  EUA will remain  in effect (meaning this test can be used) for the duration of the COVID-19 declaration under Section 564(b)(1) of the Act, 21 U.S.C.section 360bbb-3(b)(1), unless the authorization is terminated  or revoked sooner.       Influenza A by PCR NEGATIVE NEGATIVE Final   Influenza B by PCR NEGATIVE NEGATIVE Final    Comment: (NOTE) The Xpert Xpress SARS-CoV-2/FLU/RSV plus assay is intended as an aid in the diagnosis of influenza from Nasopharyngeal swab specimens and should not be used as a sole basis for treatment. Nasal washings and aspirates are unacceptable for Xpert Xpress SARS-CoV-2/FLU/RSV testing.  Fact Sheet for Patients: EntrepreneurPulse.com.au  Fact Sheet for Healthcare Providers: IncredibleEmployment.be  This test is not yet approved or cleared by the [place] FDA and has been authorized for detection and/or diagnosis of SARS-CoV-2 by FDA under an Emergency Use Authorization (EUA). This EUA will remain in effect (meaning this test can be used) for the duration of the COVID-19 declaration under Section 564(b)(1) of the Act, 21 U.S.C. section 360bbb-3(b)(1), unless the authorization is terminated or revoked.  Performed at [facility], [place] [address] [place], [place] [address]   Culture, blood (routine x 2) Call MD if unable to obtain prior to antibiotics being given     Status: None (Preliminary result)   Collection Time: 06/15/21  8:15 AM   Specimen: BLOOD  Result Value Ref Range Status   Specimen Description   Final    BLOOD RIGHT ANTECUBITAL Performed at [facility] [address], [place], [place] [address]    Special Requests   Final    BOTTLES DRAWN AEROBIC AND ANAEROBIC Blood Culture adequate volume Performed at [facility] [address] [place], [place] [address]    Culture   Final    NO GROWTH < 24 HOURS Performed at [facility] [address] [place], [place] [address]    Report Status PENDING  Incomplete  Culture, blood (routine x 2) Call MD if unable to obtain prior to antibiotics being given     Status: None (Preliminary result)   Collection Time: 06/15/21  8:29 AM   Specimen: BLOOD  Result Value Ref Range Status   Specimen Description   Final    BLOOD LEFT ANTECUBITAL Performed at [facility] [address] [place], [place] [address]    Special Requests   Final    BOTTLES DRAWN AEROBIC AND ANAEROBIC Blood Culture adequate volume Performed at [facility] [address] [place], [place] [address]    Culture  Final    NO GROWTH < 24 HOURS Performed at [facility], [place] [address], [place], [place] [address]    Report Status PENDING  Incomplete     Radiology Studies: DG Chest Port 1 View  Result Date: 06/15/2021 CLINICAL DATA:  Worsening shortness of breath. Weakness. History of COPD. EXAM: PORTABLE CHEST 1 VIEW COMPARISON:  Chest 06/24/2020, 11/24/2014. FINDINGS: Mediastinum hilar structures normal. Cardiomegaly, no pulmonary venous congestion. Persistent right base pleural-parenchymal thickening consistent with scarring. No interim change. No acute infiltrate. No pleural effusion or pneumothorax. Postsurgical changes noted over the neck. Thoracic spine scoliosis again noted. IMPRESSION: 1.  Cardiomegaly, no pulmonary venous congestion. 2. Persistent right base pleural-parenchymal thickening consistent with scarring. No acute infiltrate. Electronically Signed   By: Marcello Moores  Register   On: 06/15/2021 06:32   DG Chest Port 1 View  Final Result      Scheduled Meds:  aspirin EC  81 mg Oral q AM   atorvastatin  40 mg Oral QHS   diltiazem  180 mg Oral BID   doxycycline  100 mg Oral Q12H   enoxaparin (LOVENOX) injection  40 mg Subcutaneous Q24H   fluticasone furoate-vilanterol  1 puff Inhalation Daily   And   umeclidinium bromide  1 puff Inhalation  Daily   losartan  100 mg Oral Daily   And   hydrochlorothiazide  25 mg Oral Daily   HYDROcodone-acetaminophen  1 tablet Oral QID   linaclotide  290 mcg Oral QAC breakfast   mirtazapine  7.5 mg Oral QHS   multivitamin with minerals  1 tablet Oral q AM   nicotine  21 mg Transdermal Daily   predniSONE  40 mg Oral Q breakfast   tamsulosin  0.4 mg Oral BID   PRN Meds: albuterol, hydrALAZINE, labetalol Continuous Infusions:   LOS: 1 day  Time spent: Greater than 50% of the 35 minute visit was spent in counseling/coordination of care for the patient as laid out in the A&P.   Dwyane Dee, MD [facility] 06/16/2021, 3:01 PM

## 2021-06-16 NOTE — Progress Notes (Signed)
Patient up to [facility] with O2 in place. Severe desaturations noted into low-mid 70's, tachycardic and dyspneic. Once placed back in bed, saturations normalized but took a few minutes to climb back into low 90's range. RN made aware. BD given for DOE.At this time, patient states she had not been using her home nebulizer prior to admit and would like to be restarted on her home regimen, which she states is 4 times daily and a trelegy inhaler in the mornings. RT protocol assessment completed. Orders changed appropriately.

## 2021-06-16 NOTE — Evaluation (Signed)
Occupational Therapy Evaluation Patient Details Name: [name] MRN: [id number] DOB: June 11, 1951 Today's Date: 06/16/2021    History of Present Illness Patient is a 70 y.o. female presented with SOB for 2-3 days, pt admitted for COPD exacerbation. PMH significant for chronic hypoxemic respiratory failure with COPD-on 3 L of oxygen at baseline,HTN, HLD, CHF, arthritis, chronic back pain, depression/anxiety, tobacco abuse.   Clinical Impression   [name] is a 70 year old woman who presents on 3 L [place] and admitted with COPD exacerbation. On evaluation she demonstrates independence with bed mobility, able to walk to bathroom and perform toileting without DME. Patient ambulated in hall for a total of 60 feet and o2 sat 96% on return to room. Patient is at her baseline in regards to functional abilities thought she reports endurance has decreased and is requesting [facility] PT at discharge. No OT needs however.    Follow Up Recommendations  No OT follow up    Equipment Recommendations  None recommended by OT    Recommendations for Other Services       Precautions / Restrictions Precautions Precautions: Fall Precaution Comments: watch sats, 3L at baseline Restrictions Weight Bearing Restrictions: No      Mobility Bed Mobility Overal bed mobility: Modified Independent                  Transfers Overall transfer level: Modified independent               General transfer comment: No physical assistance required. Pushed oxygen tank in hall. Ambulated approx 60 feet on 3 L. o2  sat 96% after ambulation.    Balance Overall balance assessment: No apparent balance deficits (not formally assessed)                                         ADL either performed or assessed with clinical judgement   ADL Overall ADL's : At baseline                                       General ADL Comments: increased time, on oxygen.     Vision  Patient Visual Report: No change from baseline       Perception     Praxis      Pertinent Vitals/Pain Pain Assessment: 0-10 Pain Score: 8  Pain Location: general, back, legs, arms Pain Descriptors / Indicators: Other (Comment) (none) Pain Intervention(s): Premedicated before session     Hand Dominance Right   Extremity/Trunk Assessment Upper Extremity Assessment Upper Extremity Assessment: Overall WFL for tasks assessed   Lower Extremity Assessment Lower Extremity Assessment: Defer to PT evaluation   Cervical / Trunk Assessment Cervical / Trunk Assessment: Normal   Communication Communication Communication: No difficulties   Cognition Arousal/Alertness: Awake/alert Behavior During Therapy: WFL for tasks assessed/performed Overall Cognitive Status: Within Functional Limits for tasks assessed                                     General Comments       Exercises     Shoulder Instructions      Home Living Family/patient expects to be discharged to:: Private residence Living Arrangements: Children Available Help at Discharge: Family Type  of Home: House Home Access: Stairs to enter [employer] of Steps: 6-8 at front Entrance Stairs-Rails: Right;Left Home Layout: One level     Bathroom Shower/Tub: Occupational psychologist: Handicapped height Bathroom Accessibility: Yes   Home Equipment: Environmental consultant - 2 wheels          Prior Functioning/Environment Level of Independence: Independent        Comments: Reports perofrming house hold tasks with rest breaks.        OT Problem List: Cardiopulmonary status limiting activity      OT Treatment/Interventions:      OT Goals(Current goals can be found in the care plan section) Acute Rehab OT Goals OT Goal Formulation: All assessment and education complete, DC therapy  OT Frequency:     Barriers to D/C:            Co-evaluation              AM-PAC OT "6 Clicks"  Daily Activity     Outcome Measure Help from another person eating meals?: None Help from another person taking care of personal grooming?: None Help from another person toileting, which includes using toliet, bedpan, or urinal?: None Help from another person bathing (including washing, rinsing, drying)?: None Help from another person to put on and taking off regular upper body clothing?: None Help from another person to put on and taking off regular lower body clothing?: None 6 Click Score: 24   End of Session Equipment Utilized During Treatment: Oxygen Nurse Communication: Mobility status  Activity Tolerance: Patient tolerated treatment well Patient left: in chair;with call bell/phone within reach  OT Visit Diagnosis: Muscle weakness (generalized) (M62.81)                Time: 1043-1100 OT Time Calculation (min): 17 min Charges:  OT General Charges $OT Visit: 1 Visit OT Evaluation $OT Eval Low Complexity: 1 Low  Aydin Cavalieri, OTR/L [place]  Office [phone] Pager: [phone]   Lenward Chancellor 06/16/2021, 12:56 PM

## 2021-06-17 LAB — URINALYSIS, ROUTINE W REFLEX MICROSCOPIC
Bilirubin Urine: NEGATIVE
Glucose, UA: NEGATIVE mg/dL
Hgb urine dipstick: NEGATIVE
Ketones, ur: NEGATIVE mg/dL
Leukocytes,Ua: NEGATIVE
Nitrite: NEGATIVE
Protein, ur: NEGATIVE mg/dL
Specific Gravity, Urine: 1.013 (ref 1.005–1.030)
pH: 6 (ref 5.0–8.0)

## 2021-06-17 MED ORDER — SENNOSIDES-DOCUSATE SODIUM 8.6-50 MG PO TABS
1.0000 | ORAL_TABLET | Freq: Two times a day (BID) | ORAL | Status: DC
  Administered 2021-06-17: 1 via ORAL
  Filled 2021-06-17 (×2): qty 1

## 2021-06-17 MED ORDER — POLYETHYLENE GLYCOL 3350 17 G PO PACK: 17.0000 g | PACK | Freq: Every day | ORAL | Status: DC | PRN

## 2021-06-17 MED ORDER — MELATONIN 3 MG PO TABS
3.0000 mg | ORAL_TABLET | Freq: Every day | ORAL | Status: DC
  Administered 2021-06-17 (×2): 3 mg via ORAL
  Filled 2021-06-17 (×2): qty 1

## 2021-06-17 MED ORDER — MAGNESIUM CITRATE PO SOLN
1.0000 | Freq: Once | ORAL | Status: AC
  Administered 2021-06-17: 1 via ORAL
  Filled 2021-06-17: qty 296

## 2021-06-17 NOTE — Progress Notes (Signed)
Physical Therapy Treatment Patient Details Name: [name] MRN: [id number] DOB: Feb 14, 1951 Today's Date: 06/17/2021    History of Present Illness Patient is a 70 y.o. female presented with SOB for 2-3 days, pt admitted for COPD exacerbation. PMH significant for chronic hypoxemic respiratory failure with COPD-on 3 L of oxygen at baseline,HTN, HLD, CHF, arthritis, chronic back pain, depression/anxiety, tobacco abuse.    PT Comments    Patient progressing well and increased ambulation distance to ~400' today with multiple standing rest breaks due to SOB. Pt's sats remained 88-98% on 3L/min with gait. Pt able to manage supplemental O2 tank during gait. Patient initiated functional LE strengthening in standing and sitting. She will benefit from HHPT follow up. Acute will follow up for HEP review if she remains in the hospital setting.     Follow Up Recommendations  Home health PT     Equipment Recommendations  None recommended by PT    Recommendations for Other Services       Precautions / Restrictions Precautions Precautions: Fall Precaution Comments: watch sats Restrictions Weight Bearing Restrictions: No    Mobility  Bed Mobility               General bed mobility comments: pt OOB in recliner    Transfers Overall transfer level: Needs assistance Equipment used: None Transfers: Sit to/from Stand Sit to Stand: Supervision         General transfer comment: guarding/supervision for safety. no assist needed for power up, pt steady with rise from recliner and BSC.  Ambulation/Gait Ambulation/Gait assistance: Supervision Gait Distance (Feet): 400 Feet Assistive device: None (portable O2 tank) Gait Pattern/deviations: Step-through pattern;Shuffle Gait velocity: fair   General Gait Details: supervision for safety, pt amb ~400' with 5 standing rest breaks. Pt able to manage O2 cannister in hallway and avoid obstacles. SpO2 dropped to low of 88% on 3L/min and  reached high of 98% with gait. HR in 100's. (at rest SpO2 94-100% and HR in 80's)   Stairs             Wheelchair Mobility    Modified Rankin (Stroke Patients Only)       Balance Overall balance assessment: Needs assistance Sitting-balance support: Feet supported Sitting balance-Leahy Scale: Good     Standing balance support: During functional activity;Bilateral upper extremity supported Standing balance-Leahy Scale: Good                              Cognition Arousal/Alertness: Awake/alert Behavior During Therapy: WFL for tasks assessed/performed Overall Cognitive Status: Within Functional Limits for tasks assessed                                        Exercises General Exercises - Lower Extremity Gluteal Sets: AROM;Both;10 reps;Standing (hip extension) Hip ABduction/ADduction: AROM;Both;10 reps;Standing Hip Flexion/Marching: AROM;Both;10 reps;Standing Heel Raises: AROM;Both;10 reps;Standing Mini-Sqauts: 5 reps (Sit<>Stands without UE use from recliner)    General Comments        Pertinent Vitals/Pain Pain Assessment: 0-10 Pain Score: 9  Pain Location: general, back, Rt knee Pain Descriptors / Indicators: Aching;Discomfort Pain Intervention(s): Limited activity within patient's tolerance;Monitored during session;Repositioned;Patient requesting pain meds-RN notified    Home Living                      Prior Function  PT Goals (current goals can now be found in the care plan section) Acute Rehab PT Goals Patient Stated Goal: get back to normal independence PT Goal Formulation: With patient Time For Goal Achievement: 06/29/21 Potential to Achieve Goals: Good Progress towards PT goals: Progressing toward goals    Frequency    7X/week      PT Plan Current plan remains appropriate    Co-evaluation              AM-PAC PT "6 Clicks" Mobility   Outcome Measure  Help needed turning from  your back to your side while in a flat bed without using bedrails?: None Help needed moving from lying on your back to sitting on the side of a flat bed without using bedrails?: A Little Help needed moving to and from a bed to a chair (including a wheelchair)?: A Little Help needed standing up from a chair using your arms (e.g., wheelchair or bedside chair)?: A Little Help needed to walk in hospital room?: A Little Help needed climbing 3-5 steps with a railing? : A Little 6 Click Score: 19    End of Session Equipment Utilized During Treatment: Gait belt Activity Tolerance: Patient tolerated treatment well Patient left: in chair;with call bell/phone within reach Nurse Communication: Mobility status PT Visit Diagnosis: Muscle weakness (generalized) (M62.81);Difficulty in walking, not elsewhere classified (R26.2)     Time: 8871-9597 PT Time Calculation (min) (ACUTE ONLY): 28 min  Charges:  $Gait Training: 8-22 mins $Therapeutic Exercise: 8-22 mins                     [name], DPT Acute Rehabilitation Services Office [phone] Pager [phone]    Jacques Navy 06/17/2021, 1:18 PM

## 2021-06-17 NOTE — Plan of Care (Signed)
  Problem: Education: Goal: Knowledge of General Education information will improve Description Including pain rating scale, medication(s)/side effects and non-pharmacologic comfort measures Outcome: Progressing   

## 2021-06-17 NOTE — Progress Notes (Signed)
Progress Note    SHUNTELL FOODY   HUT:[id number]  DOB: 10/29/1951  DOA: 06/15/2021     2  PCP: Asencion Noble, MD  CC: Shortness of breath, cough  Hospital Course: [name] is a 70 y.o. female with medical history significant of chronic hypoxemic respiratory failure in the setting of COPD-on 3 L of oxygen via nasal cannula, hypertension, hyperlipidemia, CHF, arthritis, chronic back pain, depression/anxiety, tobacco abuse presented with complaint of shortness of breath since 2 days.   Patient reports worsening shortness of breath especially with exertion associated with wheezing.  Reports mild cough and chest congestion however denies sputum production, fever, chills, chest pain, leg swelling, orthopnea, PND, abdominal pain, headache, blurry vision, urinary or bowel changes.  She was given breathing treatment by EMS in route to hospital.  She continues to smoke 3 cigarettes/day however states that she quit 2 days ago.  Denies alcohol or any illicit drug use.  Her daughter who lives with her recently had massive stroke.   ED Course: Upon arrival to ED: Patient's blood pressure 156/67, afebrile, mildly tachypneic, ABG showed PCO2 115, BNP of 114, chest x-ray showed no signs of fluid overload or pneumonia.  Patient placed on BiPAP, was fgiven albuterol breathing treatment along with Solu-Medrol.  [facility] hospitalist consulted for admission for acute COPD exacerbation.  Interval History:  Still feeling short of breath this morning with not as much air movement as she wishes. Hoping for 1 more night in the hospital and for feeling better tomorrow enough to go home. She is now expressing burning with urination that started yesterday.  She has been on doxycycline but we discussed we would would obtain a UA to better evaluate.  ROS: Constitutional: positive for fatigue and malaise, negative for chills and fevers, Respiratory: positive for cough, dyspnea on exertion, and pleurisy/chest pain,  negative for hemoptysis and stridor, Cardiovascular: negative for chest pain, and Gastrointestinal: negative for abdominal pain  Assessment & Plan: Acute on chronic hypoxic and hypercarbic respiratory failure in the setting of acute exacerbation of COPD: -Patient has history of COPD-on 3 L of oxygen with current tobacco use.  Presented with shortness of breath and wheezing.  Symptoms improved with breathing treatment and steroids -ABG shows PCO2 of 115.  No signs of fluid overload.  BNP: 114.  Chest x-ray negative for fluid overload or pneumonia.  COVID-19 negative. -She is afebrile with leukocytosis of 11.5 -Still tight breath sounds with decreased air movement on 06/16/2021 and 6/23 - Continue steroids, breathing treatments - 1 more night of treatment and monitoring in hospital   Dysuria - Check UA - Continue doxycycline for now.    Hypertension -Continue current regimen   History of CHF (unknown type): Patient appears euvolemic -No echo in the system.  BNP: 114.  Chest x-ray negative for fluid overload -On Lasix 40 mg once daily at home.  We will continue same -Continue aspirin, statin -Strict I&O's and daily weight. -Monitor electrolytes   Chronic back pain: Continue home meds hydrocodone-acetaminophen as needed   Depression/anxiety: Continue home meds mirtazapine   Hyperlipidemia: Continue atorvastatin   Chronic urinary issues/retention:  -Followed by urology outpatient.  Continue Flomax   Macrocytic anemia: -H&H 11.1/38.3, MCV 104.6.  H&H is stable -check B12 (341), folate (45) - start oral B12   Tobacco abuse: Counseled about cessation -We will add nicotine patch   History of chronic constipation and colonic adenoma: -Followed by GI outpatient.  Continue Linzess and MiraLAX.  Old records reviewed in  assessment of this patient  Antimicrobials: Doxy 6/21 >> current  DVT prophylaxis: enoxaparin (LOVENOX) injection 40 mg Start: 06/15/21 2200 SCDs Start: 06/15/21  0759   Code Status:   Code Status: Full Code Family Communication:   Disposition Plan: Status is: Inpatient  Remains inpatient appropriate because:Inpatient level of care appropriate due to severity of illness and ongoing SOB and difficulty with work of breathing  Dispo: The patient is from: Home              Anticipated d/c is to: Home              Patient currently is not medically stable to d/c.   Difficult to place patient No   Risk of unplanned readmission score: Unplanned Admission- Pilot do not use: 14.32   Objective: Blood pressure 117/86, pulse 91, temperature 98.5 F (36.9 C), temperature source Oral, resp. rate 20, height 5\' 3"  (1.6 m), weight 72.6 kg, SpO2 93 %.  Examination: General appearance: alert, cooperative, and no distress Head: Normocephalic, without obvious abnormality, atraumatic Eyes:  No movement of right eye due to prior surgical damage.  Left eye vision and movements intact Lungs:  Tight breath sounds with decreased air movement and expiratory wheezing Heart: regular rate and rhythm and S1, S2 normal Abdomen: normal findings: bowel sounds normal and soft, non-tender Extremities:  No edema Skin:  Warm, dry, intact Neurologic: Grossly normal  Consultants:    Procedures:    Data Reviewed: I have personally reviewed following labs and imaging studies No results found for this or any previous visit (from the past 24 hour(s)).   Recent Results (from the past 240 hour(s))  Resp Panel by RT-PCR (Flu A&B, Covid) Nasopharyngeal Swab     Status: None   Collection Time: 06/15/21  6:03 AM   Specimen: Nasopharyngeal Swab; Nasopharyngeal(NP) swabs in vial transport medium  Result Value Ref Range Status   SARS Coronavirus 2 by RT PCR NEGATIVE NEGATIVE Final    Comment: (NOTE) SARS-CoV-2 target nucleic acids are NOT DETECTED.  The SARS-CoV-2 RNA is generally detectable in upper respiratory specimens during the acute phase of infection. The  lowest concentration of SARS-CoV-2 viral copies this assay can detect is 138 copies/mL. A negative result does not preclude SARS-Cov-2 infection and should not be used as the sole basis for treatment or other patient management decisions. A negative result may occur with  improper specimen collection/handling, submission of specimen other than nasopharyngeal swab, presence of viral mutation(s) within the areas targeted by this assay, and inadequate number of viral copies(<138 copies/mL). A negative result must be combined with clinical observations, patient history, and epidemiological information. The expected result is Negative.  Fact Sheet for Patients:  EntrepreneurPulse.com.au  Fact Sheet for Healthcare Providers:  IncredibleEmployment.be  This test is no t yet approved or cleared by the [place] FDA and  has been authorized for detection and/or diagnosis of SARS-CoV-2 by FDA under an Emergency Use Authorization (EUA). This EUA will remain  in effect (meaning this test can be used) for the duration of the COVID-19 declaration under Section 564(b)(1) of the Act, 21 U.S.C.section 360bbb-3(b)(1), unless the authorization is terminated  or revoked sooner.       Influenza A by PCR NEGATIVE NEGATIVE Final   Influenza B by PCR NEGATIVE NEGATIVE Final    Comment: (NOTE) The Xpert Xpress SARS-CoV-2/FLU/RSV plus assay is intended as an aid in the diagnosis of influenza from Nasopharyngeal swab specimens and should not be used as a sole  basis for treatment. Nasal washings and aspirates are unacceptable for Xpert Xpress SARS-CoV-2/FLU/RSV testing.  Fact Sheet for Patients: EntrepreneurPulse.com.au  Fact Sheet for Healthcare Providers: IncredibleEmployment.be  This test is not yet approved or cleared by the [place] FDA and has been authorized for detection and/or diagnosis of SARS-CoV-2 by FDA under  an Emergency Use Authorization (EUA). This EUA will remain in effect (meaning this test can be used) for the duration of the COVID-19 declaration under Section 564(b)(1) of the Act, 21 U.S.C. section 360bbb-3(b)(1), unless the authorization is terminated or revoked.  Performed at [facility], [place] [address] [place], [place] [address]   Culture, blood (routine x 2) Call MD if unable to obtain prior to antibiotics being given     Status: None (Preliminary result)   Collection Time: 06/15/21  8:15 AM   Specimen: BLOOD  Result Value Ref Range Status   Specimen Description   Final    BLOOD RIGHT ANTECUBITAL Performed at [facility] [address], [place], [place] [address]    Special Requests   Final    BOTTLES DRAWN AEROBIC AND ANAEROBIC Blood Culture adequate volume Performed at [facility] [address] [place], [place] [address]    Culture   Final    NO GROWTH 2 DAYS Performed at [facility] [address] [place], [place] [address]    Report Status PENDING  Incomplete  Culture, blood (routine x 2) Call MD if unable to obtain prior to antibiotics being given     Status: None (Preliminary result)   Collection Time: 06/15/21  8:29 AM   Specimen: BLOOD  Result Value Ref Range Status   Specimen Description   Final    BLOOD LEFT ANTECUBITAL Performed at [facility] [address] [place], [place] [address]    Special Requests   Final    BOTTLES DRAWN AEROBIC AND ANAEROBIC Blood Culture adequate volume Performed at [facility] [address], [place], [place] [address]    Culture   Final    NO GROWTH 2 DAYS Performed at [facility] [address] [place], [place] [address]    Report Status PENDING  Incomplete     Radiology Studies: No results found. DG Chest Port 1 View  Final Result      Scheduled Meds:  albuterol  2.5 mg Nebulization QID    aspirin EC  81 mg Oral q AM   atorvastatin  40 mg Oral QHS   diltiazem  180 mg Oral BID   doxycycline  100 mg Oral Q12H   enoxaparin (LOVENOX) injection  40 mg Subcutaneous Q24H   fluticasone furoate-vilanterol  1 puff Inhalation Daily   And   umeclidinium bromide  1 puff Inhalation Daily   losartan  100 mg Oral Daily   And   hydrochlorothiazide  25 mg Oral Daily   HYDROcodone-acetaminophen  1 tablet Oral QID   linaclotide  290 mcg Oral QAC breakfast   melatonin  3 mg Oral QHS   mirtazapine  7.5 mg Oral QHS   multivitamin with minerals  1 tablet Oral q AM   nicotine  21 mg Transdermal Daily   predniSONE  40 mg Oral Q breakfast   senna-docusate  1 tablet Oral BID   tamsulosin  0.4 mg Oral BID   vitamin B-12  1,000 mcg Oral Daily   PRN Meds: albuterol, hydrALAZINE, labetalol, polyethylene glycol Continuous Infusions:  LOS: 2 days  Time spent: Greater than 50% of the 35 minute visit was spent in counseling/coordination of care for the patient as laid out in the A&P.   Dwyane Dee, MD [facility] 06/17/2021, 3:29 PM

## 2021-06-17 NOTE — Progress Notes (Signed)
End of Shift: Patient is in bed at end of shift with call bell in reach. Patient voiced that she needed pain medicine and her diuretic pill that is not due until 1000. Myself and Jerene Pitch, RN explained that we would have to call the Provider to get her medicines changed. Patient seems anxious but understood that we needed to call provider to change med times.

## 2021-06-18 MED ORDER — DOXYCYCLINE HYCLATE 100 MG PO TABS: 100.0000 mg | ORAL_TABLET | Freq: Two times a day (BID) | ORAL | 0 refills | Status: AC

## 2021-06-18 MED ORDER — ONDANSETRON HCL 4 MG/2ML IJ SOLN
4.0000 mg | Freq: Once | INTRAMUSCULAR | Status: AC
  Administered 2021-06-18: 4 mg via INTRAVENOUS
  Filled 2021-06-18: qty 2

## 2021-06-18 MED ORDER — PREDNISONE 20 MG PO TABS: 40.0000 mg | ORAL_TABLET | Freq: Every day | ORAL | 0 refills | Status: AC

## 2021-06-18 MED ORDER — CYANOCOBALAMIN 1000 MCG PO TABS: 1000.0000 ug | ORAL_TABLET | Freq: Every day | ORAL | Status: DC

## 2021-06-18 MED ORDER — ONDANSETRON HCL 4 MG PO TABS: 4.0000 mg | ORAL_TABLET | Freq: Three times a day (TID) | ORAL | Status: DC | PRN

## 2021-06-18 NOTE — TOC Transition Note (Signed)
Transition of Care [facility]) - CM/SW Discharge Note   Patient Details  Name: [name] MRN: [id number] Date of Birth: 06-Nov-1951  Transition of Care [facility]) CM/SW Contact:  Ross Ludwig, LCSW Phone Number: 06/18/2021, 4:22 PM   Clinical Narrative:     Patient will be going home with home health PT through [facility].  CSW signing off please reconsult with any other social work needs, home health agency has been notified of planned discharge.    Final next level of care: [place] Barriers to Discharge: Barriers Resolved   Patient Goals and CMS Choice Patient states their goals for this hospitalization and ongoing recovery are:: To return back home with home health. CMS Medicare.gov Compare Post Acute Care list provided to:: Patient Choice offered to / list presented to : Patient  Discharge Placement  Patient discharging home with home health PT.                Name of family member notified: Patient is aware of discharge for today. Patient and family notified of of transfer: 06/18/21  Discharge Plan and Services                          HH Arranged: PT [facility] Agency: [facility] Date [facility] Agency Contacted: 06/18/21 Time [place]: [place] Representative spoke with at [place]: [place] ([place]) Interventions     Readmission Risk Interventions No flowsheet data found.

## 2021-06-19 NOTE — Discharge Summary (Signed)
Physician Discharge Summary   [name] ZOX:[id number] DOB: 05-Oct-1951 DOA: 06/15/2021  PCP: Asencion Noble, MD  Admit date: 06/15/2021 Discharge date: 06/18/2021   Admitted From: home Disposition:  home Discharging physician: Dwyane Dee, MD  Recommendations for Outpatient Follow-up:  Follow up smoking cessation  Home Health: PT Equipment/Devices:   Patient discharged to home in Discharge Condition: stable Risk of unplanned readmission score: Unplanned Admission- Pilot do not use: 14.89  CODE STATUS: Full Diet recommendation:  Diet Orders (From admission, onward)     Start     Ordered   06/18/21 0000  Diet - low sodium heart healthy        06/18/21 1345            Hospital Course:  [name] is a 70 y.o. female with medical history significant of chronic hypoxemic respiratory failure in the setting of COPD-on 3 L of oxygen via nasal cannula, hypertension, hyperlipidemia, CHF, arthritis, chronic back pain, depression/anxiety, tobacco abuse presented with complaint of shortness of breath since 2 days.   Patient reports worsening shortness of breath especially with exertion associated with wheezing.  Reports mild cough and chest congestion however denies sputum production, fever, chills, chest pain, leg swelling, orthopnea, PND, abdominal pain, headache, blurry vision, urinary or bowel changes.  She was given breathing treatment by EMS in route to hospital.  She continues to smoke 3 cigarettes/day however states that she quit 2 days ago.  Denies alcohol or any illicit drug use.  Her daughter who lives with her recently had massive stroke.   ED Course: Upon arrival to ED: Patient's blood pressure 156/67, afebrile, mildly tachypneic, ABG showed PCO2 115, BNP of 114, chest x-ray showed no signs of fluid overload or pneumonia.  Patient placed on BiPAP, was fgiven albuterol breathing treatment along with Solu-Medrol.   Acute on chronic hypoxic and hypercarbic respiratory  failure in the setting of acute exacerbation of COPD: -Patient has history of COPD-on 3 L of oxygen with current tobacco use.  Presented with shortness of breath and wheezing.  Symptoms improved with breathing treatment and steroids -ABG shows PCO2 of 115.  No signs of fluid overload.  BNP: 114.  Chest x-ray negative for fluid overload or pneumonia.  COVID-19 negative. -She is afebrile with leukocytosis of 11.5 -Still tight breath sounds with decreased air movement on 06/16/2021 and 6/23 - Continue steroids, breathing treatments -Discharged to complete course of prednisone and doxycycline.  Overall respiratory status improved prior to discharge and she was able to ambulate the hallways easily with PT   Dysuria - Check UA: Negative - Continue doxycycline for now.   Hypertension -Continue current regimen   History of CHF (unknown type): Patient appears euvolemic -No echo in the system.  BNP: 114.  Chest x-ray negative for fluid overload -On Lasix 40 mg once daily at home.  We will continue same -Continue aspirin, statin   Chronic back pain: Continue home meds hydrocodone-acetaminophen as needed   Depression/anxiety: Continue home meds mirtazapine   Hyperlipidemia: Continue atorvastatin   Chronic urinary issues/retention:  -Followed by urology outpatient.  Continue Flomax   Macrocytic anemia: -H&H 11.1/38.3, MCV 104.6.  H&H is stable -check B12 (341), folate (45) - start oral B12   Tobacco abuse: Counseled about cessation -We will add nicotine patch   History of chronic constipation and colonic adenoma: -Followed by GI outpatient.  Continue Linzess and MiraLAX.  The patient's chronic medical conditions were treated accordingly per the patient's home medication regimen except  as noted.  On day of discharge, patient was felt deemed stable for discharge. Patient/family member advised to call PCP or come back to ER if needed.   Principal Diagnosis: COPD exacerbation  [facility])  Discharge Diagnoses: Active Hospital Problems   Diagnosis Date Noted   COPD exacerbation ([place]) 06/15/2021    Priority: High   Hypertension    Anxiety    CHF (congestive heart failure) (H[facility])    GERD (gastroesophageal reflux disease) 09/04/2014   Macrocytic anemia 09/04/2014   Nicotine addiction 04/07/2014    Resolved Hospital Problems  No resolved problems to display.    Discharge Instructions     Diet - low sodium heart healthy   Complete by: As directed    Increase activity slowly   Complete by: As directed       Allergies as of 06/18/2021   No Known Allergies      Medication List     STOP taking these medications    naloxegol oxalate 25 MG Tabs tablet Commonly known as: Movantik       TAKE these medications    albuterol 108 (90 Base) MCG/ACT inhaler Commonly known as: VENTOLIN HFA Inhale 2 puffs into the lungs every 6 (six) hours as needed for wheezing or shortness of breath. For shortness of breath   aspirin EC 81 MG tablet Take 81 mg by mouth in the morning. Swallow whole.   atorvastatin 40 MG tablet Commonly known as: LIPITOR Take 40 mg by mouth at bedtime.   Cartia XT 180 MG 24 hr capsule Generic drug: diltiazem Take 180 mg by mouth 2 (two) times daily.   cyanocobalamin 1000 MCG tablet Take 1 tablet (1,000 mcg total) by mouth daily.   doxycycline 100 MG tablet Commonly known as: VIBRA-TABS Take 1 tablet (100 mg total) by mouth every 12 (twelve) hours for 1 day.   HYDROcodone-acetaminophen 10-325 MG tablet Commonly known as: NORCO Take 1 tablet by mouth See admin instructions. 1 tablet orally four times daily   linaclotide 290 MCG Caps capsule Commonly known as: Linzess Take 1 capsule (290 mcg total) by mouth daily before breakfast. What changed:  when to take this reasons to take this   losartan-hydrochlorothiazide 100-25 MG tablet Commonly known as: HYZAAR Take 1 tablet by mouth in the morning.   mirtazapine 7.5 MG  tablet Commonly known as: REMERON Take 7.5 mg by mouth at bedtime.   multivitamin with minerals Tabs tablet Take 1 tablet by mouth in the morning.   predniSONE 20 MG tablet Commonly known as: DELTASONE Take 2 tablets (40 mg total) by mouth daily with breakfast for 1 day.   tamsulosin 0.4 MG Caps capsule Commonly known as: FLOMAX Take 1 capsule (0.4 mg total) by mouth 2 (two) times daily. What changed: when to take this   Trelegy Ellipta 100-62.5-25 MCG/INH Aepb Generic drug: Fluticasone-Umeclidin-Vilant Take 1 puff by mouth in the morning.        No Known Allergies  Consultations:   Discharge Exam: BP (!) 129/58 (BP Location: Right Arm)   Pulse 89   Temp 98.3 F (36.8 C) (Oral)   Resp 20   Ht 5\' 3"  (1.6 m)   Wt 72.6 kg   SpO2 96%   BMI 28.34 kg/m  General appearance: alert, cooperative, and no distress Head: Normocephalic, without obvious abnormality, atraumatic Eyes:  No movement of right eye due to prior surgical damage.  Left eye vision and movements intact Lungs: Improved air movement and breath sounds bilaterally, minimal wheezing  Heart: regular rate and rhythm and S1, S2 normal Abdomen: normal findings: bowel sounds normal and soft, non-tender Extremities:  No edema Skin:  Warm, dry, intact Neurologic: Grossly normal  The results of significant diagnostics from this hospitalization (including imaging, microbiology, ancillary and laboratory) are listed below for reference.   Microbiology: Recent Results (from the past 240 hour(s))  Resp Panel by RT-PCR (Flu A&B, Covid) Nasopharyngeal Swab     Status: None   Collection Time: 06/15/21  6:03 AM   Specimen: Nasopharyngeal Swab; Nasopharyngeal(NP) swabs in vial transport medium  Result Value Ref Range Status   SARS Coronavirus 2 by RT PCR NEGATIVE NEGATIVE Final    Comment: (NOTE) SARS-CoV-2 target nucleic acids are NOT DETECTED.  The SARS-CoV-2 RNA is generally detectable in upper respiratory specimens  during the acute phase of infection. The lowest concentration of SARS-CoV-2 viral copies this assay can detect is 138 copies/mL. A negative result does not preclude SARS-Cov-2 infection and should not be used as the sole basis for treatment or other patient management decisions. A negative result may occur with  improper specimen collection/handling, submission of specimen other than nasopharyngeal swab, presence of viral mutation(s) within the areas targeted by this assay, and inadequate number of viral copies(<138 copies/mL). A negative result must be combined with clinical observations, patient history, and epidemiological information. The expected result is Negative.  Fact Sheet for Patients:  EntrepreneurPulse.com.au  Fact Sheet for Healthcare Providers:  IncredibleEmployment.be  This test is no t yet approved or cleared by the [place] FDA and  has been authorized for detection and/or diagnosis of SARS-CoV-2 by FDA under an Emergency Use Authorization (EUA). This EUA will remain  in effect (meaning this test can be used) for the duration of the COVID-19 declaration under Section 564(b)(1) of the Act, 21 U.S.C.section 360bbb-3(b)(1), unless the authorization is terminated  or revoked sooner.       Influenza A by PCR NEGATIVE NEGATIVE Final   Influenza B by PCR NEGATIVE NEGATIVE Final    Comment: (NOTE) The Xpert Xpress SARS-CoV-2/FLU/RSV plus assay is intended as an aid in the diagnosis of influenza from Nasopharyngeal swab specimens and should not be used as a sole basis for treatment. Nasal washings and aspirates are unacceptable for Xpert Xpress SARS-CoV-2/FLU/RSV testing.  Fact Sheet for Patients: EntrepreneurPulse.com.au  Fact Sheet for Healthcare Providers: IncredibleEmployment.be  This test is not yet approved or cleared by the [place] FDA and has been authorized for detection  and/or diagnosis of SARS-CoV-2 by FDA under an Emergency Use Authorization (EUA). This EUA will remain in effect (meaning this test can be used) for the duration of the COVID-19 declaration under Section 564(b)(1) of the Act, 21 U.S.C. section 360bbb-3(b)(1), unless the authorization is terminated or revoked.  Performed at [facility], [place] [address], [place], [place] [address]   Culture, blood (routine x 2) Call MD if unable to obtain prior to antibiotics being given     Status: None (Preliminary result)   Collection Time: 06/15/21  8:15 AM   Specimen: BLOOD  Result Value Ref Range Status   Specimen Description   Final    BLOOD RIGHT ANTECUBITAL Performed at [place] [address] [place], [place] [address]    Special Requests   Final    BOTTLES DRAWN AEROBIC AND ANAEROBIC Blood Culture adequate volume Performed at [facility] [address] [place], [place] [address]    Culture   Final    NO  GROWTH 4 DAYS Performed at [facility], [place] [address], [place], [place] [address]    Report Status PENDING  Incomplete  Culture, blood (routine x 2) Call MD if unable to obtain prior to antibiotics being given     Status: None (Preliminary result)   Collection Time: 06/15/21  8:29 AM   Specimen: BLOOD  Result Value Ref Range Status   Specimen Description   Final    BLOOD LEFT ANTECUBITAL Performed at [facility] [address] [place], [place] [address]    Special Requests   Final    BOTTLES DRAWN AEROBIC AND ANAEROBIC Blood Culture adequate volume Performed at [facility] [address] [place], [place] [address]    Culture   Final    NO GROWTH 4 DAYS Performed at [facility], [place] [address] [place], [place] [address]    Report Status PENDING  Incomplete     Labs: BNP (last 3 results) Recent Labs    06/15/21 0620  BNP 413.2*   Basic Metabolic  Panel: Recent Labs  Lab 06/15/21 0620 06/15/21 0815 06/16/21 0338  NA 137  --  139  K 3.8  --  3.7  CL 81*  --  82*  CO2 50*  --  50*  GLUCOSE 126*  --  149*  BUN 13  --  15  CREATININE 0.33* 0.45 0.49  CALCIUM 9.0  --  8.9  MG  --  1.9  --    Liver Function Tests: Recent Labs  Lab 06/15/21 0620  AST 13*  ALT 14  ALKPHOS 50  BILITOT 0.3  PROT 6.9  ALBUMIN 4.0   No results for input(s): LIPASE, AMYLASE in the last 168 hours. No results for input(s): AMMONIA in the last 168 hours. CBC: Recent Labs  Lab 06/15/21 0620 06/15/21 0815 06/16/21 0338  WBC 11.5* 12.8* 8.9  NEUTROABS 9.3*  --   --   HGB 11.1* 11.2* 11.4*  HCT 38.3 38.1 38.5  MCV 104.6* 104.1* 101.0*  PLT 187 183 189   Cardiac Enzymes: No results for input(s): CKTOTAL, CKMB, CKMBINDEX, TROPONINI in the last 168 hours. BNP: Invalid input(s): POCBNP CBG: No results for input(s): GLUCAP in the last 168 hours. D-Dimer No results for input(s): DDIMER in the last 72 hours. Hgb A1c No results for input(s): HGBA1C in the last 72 hours. Lipid Profile No results for input(s): CHOL, HDL, LDLCALC, TRIG, CHOLHDL, LDLDIRECT in the last 72 hours. Thyroid function studies No results for input(s): TSH, T4TOTAL, T3FREE, THYROIDAB in the last 72 hours.  Invalid input(s): FREET3 Anemia work up No results for input(s): VITAMINB12, FOLATE, FERRITIN, TIBC, IRON, RETICCTPCT in the last 72 hours. Urinalysis    Component Value Date/Time   COLORURINE YELLOW 06/17/2021 1727   APPEARANCEUR CLEAR 06/17/2021 1727   APPEARANCEUR Clear 03/02/2021 1629   LABSPEC 1.013 06/17/2021 1727   PHURINE 6.0 06/17/2021 1727   GLUCOSEU NEGATIVE 06/17/2021 1727   HGBUR NEGATIVE 06/17/2021 1727   BILIRUBINUR NEGATIVE 06/17/2021 1727   BILIRUBINUR Negative 03/02/2021 [place] 06/17/2021 1727   PROTEINUR NEGATIVE 06/17/2021 1727   UROBILINOGEN 0.2 08/17/2011 1302   NITRITE NEGATIVE 06/17/2021 1727   LEUKOCYTESUR  NEGATIVE 06/17/2021 1727   Sepsis Labs Invalid input(s): PROCALCITONIN,  WBC,  LACTICIDVEN Microbiology Recent Results (from the past 240 hour(s))  Resp Panel by RT-PCR (Flu A&B, Covid) Nasopharyngeal Swab     Status: None   Collection Time: 06/15/21  6:03  AM   Specimen: Nasopharyngeal Swab; Nasopharyngeal(NP) swabs in vial transport medium  Result Value Ref Range Status   SARS Coronavirus 2 by RT PCR NEGATIVE NEGATIVE Final    Comment: (NOTE) SARS-CoV-2 target nucleic acids are NOT DETECTED.  The SARS-CoV-2 RNA is generally detectable in upper respiratory specimens during the acute phase of infection. The lowest concentration of SARS-CoV-2 viral copies this assay can detect is 138 copies/mL. A negative result does not preclude SARS-Cov-2 infection and should not be used as the sole basis for treatment or other patient management decisions. A negative result may occur with  improper specimen collection/handling, submission of specimen other than nasopharyngeal swab, presence of viral mutation(s) within the areas targeted by this assay, and inadequate number of viral copies(<138 copies/mL). A negative result must be combined with clinical observations, patient history, and epidemiological information. The expected result is Negative.  Fact Sheet for Patients:  EntrepreneurPulse.com.au  Fact Sheet for Healthcare Providers:  IncredibleEmployment.be  This test is no t yet approved or cleared by the [place] FDA and  has been authorized for detection and/or diagnosis of SARS-CoV-2 by FDA under an Emergency Use Authorization (EUA). This EUA will remain  in effect (meaning this test can be used) for the duration of the COVID-19 declaration under Section 564(b)(1) of the Act, 21 U.S.C.section 360bbb-3(b)(1), unless the authorization is terminated  or revoked sooner.       Influenza A by PCR NEGATIVE NEGATIVE Final   Influenza B by PCR  NEGATIVE NEGATIVE Final    Comment: (NOTE) The Xpert Xpress SARS-CoV-2/FLU/RSV plus assay is intended as an aid in the diagnosis of influenza from Nasopharyngeal swab specimens and should not be used as a sole basis for treatment. Nasal washings and aspirates are unacceptable for Xpert Xpress SARS-CoV-2/FLU/RSV testing.  Fact Sheet for Patients: EntrepreneurPulse.com.au  Fact Sheet for Healthcare Providers: IncredibleEmployment.be  This test is not yet approved or cleared by the [place] FDA and has been authorized for detection and/or diagnosis of SARS-CoV-2 by FDA under an Emergency Use Authorization (EUA). This EUA will remain in effect (meaning this test can be used) for the duration of the COVID-19 declaration under Section 564(b)(1) of the Act, 21 U.S.C. section 360bbb-3(b)(1), unless the authorization is terminated or revoked.  Performed at [facility], [place] [address], [place], [place] [address]   Culture, blood (routine x 2) Call MD if unable to obtain prior to antibiotics being given     Status: None (Preliminary result)   Collection Time: 06/15/21  8:15 AM   Specimen: BLOOD  Result Value Ref Range Status   Specimen Description   Final    BLOOD RIGHT ANTECUBITAL Performed at [facility] [address], [place], [place] [address]    Special Requests   Final    BOTTLES DRAWN AEROBIC AND ANAEROBIC Blood Culture adequate volume Performed at [facility] [address] [place], [place] [address]    Culture   Final    NO GROWTH 4 DAYS Performed at [facility], [place] [address], [place], [place] [address]    Report Status PENDING  Incomplete  Culture, blood (routine x 2) Call MD if unable to obtain prior to antibiotics being given     Status: None (Preliminary result)   Collection Time: 06/15/21  8:29 AM   Specimen: BLOOD  Result Value Ref Range Status   Specimen  Description   Final    BLOOD LEFT ANTECUBITAL Performed at [facility]  [place] [address] [place], [place] [address]    Special Requests   Final    BOTTLES DRAWN AEROBIC AND ANAEROBIC Blood Culture adequate volume Performed at [facility] [address], [place], [place] [address]    Culture   Final    NO GROWTH 4 DAYS Performed at [facility], [place] [address] [place], [place] [address]    Report Status PENDING  Incomplete    Procedures/Studies: DG Chest Port 1 View  Result Date: 06/15/2021 CLINICAL DATA:  Worsening shortness of breath. Weakness. History of COPD. EXAM: PORTABLE CHEST 1 VIEW COMPARISON:  Chest 06/24/2020, 11/24/2014. FINDINGS: Mediastinum hilar structures normal. Cardiomegaly, no pulmonary venous congestion. Persistent right base pleural-parenchymal thickening consistent with scarring. No interim change. No acute infiltrate. No pleural effusion or pneumothorax. Postsurgical changes noted over the neck. Thoracic spine scoliosis again noted. IMPRESSION: 1.  Cardiomegaly, no pulmonary venous congestion. 2. Persistent right base pleural-parenchymal thickening consistent with scarring. No acute infiltrate. Electronically Signed   By: Marcello Moores  Register   On: 06/15/2021 06:32     Time coordinating discharge: Over 33 minutes    Dwyane Dee, MD  [facility] 06/19/2021, 3:11 PM

## 2021-06-20 LAB — CULTURE, BLOOD (ROUTINE X 2)
Culture: NO GROWTH
Culture: NO GROWTH
Special Requests: ADEQUATE
Special Requests: ADEQUATE

## 2021-06-22 DIAGNOSIS — Z9981 Dependence on supplemental oxygen: Secondary | ICD-10-CM | POA: Diagnosis not present

## 2021-06-22 DIAGNOSIS — J9611 Chronic respiratory failure with hypoxia: Secondary | ICD-10-CM | POA: Diagnosis not present

## 2021-06-22 DIAGNOSIS — E785 Hyperlipidemia, unspecified: Secondary | ICD-10-CM | POA: Diagnosis not present

## 2021-06-22 DIAGNOSIS — M6281 Muscle weakness (generalized): Secondary | ICD-10-CM | POA: Diagnosis not present

## 2021-06-22 DIAGNOSIS — F32A Depression, unspecified: Secondary | ICD-10-CM | POA: Diagnosis not present

## 2021-06-22 DIAGNOSIS — M1990 Unspecified osteoarthritis, unspecified site: Secondary | ICD-10-CM | POA: Diagnosis not present

## 2021-06-22 DIAGNOSIS — J9612 Chronic respiratory failure with hypercapnia: Secondary | ICD-10-CM | POA: Diagnosis not present

## 2021-06-22 DIAGNOSIS — J449 Chronic obstructive pulmonary disease, unspecified: Secondary | ICD-10-CM | POA: Diagnosis not present

## 2021-06-22 DIAGNOSIS — J441 Chronic obstructive pulmonary disease with (acute) exacerbation: Secondary | ICD-10-CM | POA: Diagnosis not present

## 2021-06-22 DIAGNOSIS — G8929 Other chronic pain: Secondary | ICD-10-CM | POA: Diagnosis not present

## 2021-06-22 DIAGNOSIS — K5730 Diverticulosis of large intestine without perforation or abscess without bleeding: Secondary | ICD-10-CM | POA: Diagnosis not present

## 2021-06-22 DIAGNOSIS — Z7982 Long term (current) use of aspirin: Secondary | ICD-10-CM | POA: Diagnosis not present

## 2021-06-22 DIAGNOSIS — I110 Hypertensive heart disease with heart failure: Secondary | ICD-10-CM | POA: Diagnosis not present

## 2021-06-22 DIAGNOSIS — F17200 Nicotine dependence, unspecified, uncomplicated: Secondary | ICD-10-CM | POA: Diagnosis not present

## 2021-06-22 DIAGNOSIS — R339 Retention of urine, unspecified: Secondary | ICD-10-CM | POA: Diagnosis not present

## 2021-06-22 DIAGNOSIS — I509 Heart failure, unspecified: Secondary | ICD-10-CM | POA: Diagnosis not present

## 2021-06-22 DIAGNOSIS — Z7951 Long term (current) use of inhaled steroids: Secondary | ICD-10-CM | POA: Diagnosis not present

## 2021-06-22 DIAGNOSIS — K219 Gastro-esophageal reflux disease without esophagitis: Secondary | ICD-10-CM | POA: Diagnosis not present

## 2021-06-22 DIAGNOSIS — I10 Essential (primary) hypertension: Secondary | ICD-10-CM | POA: Diagnosis not present

## 2021-06-22 DIAGNOSIS — D539 Nutritional anemia, unspecified: Secondary | ICD-10-CM | POA: Diagnosis not present

## 2021-06-22 DIAGNOSIS — M549 Dorsalgia, unspecified: Secondary | ICD-10-CM | POA: Diagnosis not present

## 2021-06-25 ENCOUNTER — Other Ambulatory Visit: Payer: Self-pay | Admitting: Urology

## 2021-06-29 DIAGNOSIS — I10 Essential (primary) hypertension: Secondary | ICD-10-CM | POA: Diagnosis not present

## 2021-06-29 DIAGNOSIS — G8929 Other chronic pain: Secondary | ICD-10-CM | POA: Diagnosis not present

## 2021-06-29 DIAGNOSIS — Z9981 Dependence on supplemental oxygen: Secondary | ICD-10-CM | POA: Diagnosis not present

## 2021-06-29 DIAGNOSIS — E785 Hyperlipidemia, unspecified: Secondary | ICD-10-CM | POA: Diagnosis not present

## 2021-06-29 DIAGNOSIS — J9611 Chronic respiratory failure with hypoxia: Secondary | ICD-10-CM | POA: Diagnosis not present

## 2021-06-29 DIAGNOSIS — F32A Depression, unspecified: Secondary | ICD-10-CM | POA: Diagnosis not present

## 2021-06-29 DIAGNOSIS — J441 Chronic obstructive pulmonary disease with (acute) exacerbation: Secondary | ICD-10-CM | POA: Diagnosis not present

## 2021-06-29 DIAGNOSIS — J449 Chronic obstructive pulmonary disease, unspecified: Secondary | ICD-10-CM | POA: Diagnosis not present

## 2021-06-29 DIAGNOSIS — F17200 Nicotine dependence, unspecified, uncomplicated: Secondary | ICD-10-CM | POA: Diagnosis not present

## 2021-06-29 DIAGNOSIS — J9612 Chronic respiratory failure with hypercapnia: Secondary | ICD-10-CM | POA: Diagnosis not present

## 2021-06-29 DIAGNOSIS — Z79899 Other long term (current) drug therapy: Secondary | ICD-10-CM | POA: Diagnosis not present

## 2021-06-29 DIAGNOSIS — M549 Dorsalgia, unspecified: Secondary | ICD-10-CM | POA: Diagnosis not present

## 2021-06-29 DIAGNOSIS — I509 Heart failure, unspecified: Secondary | ICD-10-CM | POA: Diagnosis not present

## 2021-06-29 DIAGNOSIS — M2550 Pain in unspecified joint: Secondary | ICD-10-CM | POA: Diagnosis not present

## 2021-06-29 DIAGNOSIS — K5730 Diverticulosis of large intestine without perforation or abscess without bleeding: Secondary | ICD-10-CM | POA: Diagnosis not present

## 2021-06-29 DIAGNOSIS — M1990 Unspecified osteoarthritis, unspecified site: Secondary | ICD-10-CM | POA: Diagnosis not present

## 2021-06-29 DIAGNOSIS — D539 Nutritional anemia, unspecified: Secondary | ICD-10-CM | POA: Diagnosis not present

## 2021-06-29 DIAGNOSIS — K219 Gastro-esophageal reflux disease without esophagitis: Secondary | ICD-10-CM | POA: Diagnosis not present

## 2021-06-29 DIAGNOSIS — R339 Retention of urine, unspecified: Secondary | ICD-10-CM | POA: Diagnosis not present

## 2021-06-29 DIAGNOSIS — Z7982 Long term (current) use of aspirin: Secondary | ICD-10-CM | POA: Diagnosis not present

## 2021-06-29 DIAGNOSIS — I110 Hypertensive heart disease with heart failure: Secondary | ICD-10-CM | POA: Diagnosis not present

## 2021-06-29 DIAGNOSIS — Z7951 Long term (current) use of inhaled steroids: Secondary | ICD-10-CM | POA: Diagnosis not present

## 2021-07-06 DIAGNOSIS — I509 Heart failure, unspecified: Secondary | ICD-10-CM | POA: Diagnosis not present

## 2021-07-06 DIAGNOSIS — K219 Gastro-esophageal reflux disease without esophagitis: Secondary | ICD-10-CM | POA: Diagnosis not present

## 2021-07-06 DIAGNOSIS — D539 Nutritional anemia, unspecified: Secondary | ICD-10-CM | POA: Diagnosis not present

## 2021-07-06 DIAGNOSIS — G8929 Other chronic pain: Secondary | ICD-10-CM | POA: Diagnosis not present

## 2021-07-06 DIAGNOSIS — R339 Retention of urine, unspecified: Secondary | ICD-10-CM | POA: Diagnosis not present

## 2021-07-06 DIAGNOSIS — K5730 Diverticulosis of large intestine without perforation or abscess without bleeding: Secondary | ICD-10-CM | POA: Diagnosis not present

## 2021-07-06 DIAGNOSIS — J9611 Chronic respiratory failure with hypoxia: Secondary | ICD-10-CM | POA: Diagnosis not present

## 2021-07-06 DIAGNOSIS — J441 Chronic obstructive pulmonary disease with (acute) exacerbation: Secondary | ICD-10-CM | POA: Diagnosis not present

## 2021-07-06 DIAGNOSIS — F17200 Nicotine dependence, unspecified, uncomplicated: Secondary | ICD-10-CM | POA: Diagnosis not present

## 2021-07-06 DIAGNOSIS — Z7982 Long term (current) use of aspirin: Secondary | ICD-10-CM | POA: Diagnosis not present

## 2021-07-06 DIAGNOSIS — Z9981 Dependence on supplemental oxygen: Secondary | ICD-10-CM | POA: Diagnosis not present

## 2021-07-06 DIAGNOSIS — I110 Hypertensive heart disease with heart failure: Secondary | ICD-10-CM | POA: Diagnosis not present

## 2021-07-06 DIAGNOSIS — J9612 Chronic respiratory failure with hypercapnia: Secondary | ICD-10-CM | POA: Diagnosis not present

## 2021-07-06 DIAGNOSIS — E785 Hyperlipidemia, unspecified: Secondary | ICD-10-CM | POA: Diagnosis not present

## 2021-07-06 DIAGNOSIS — M549 Dorsalgia, unspecified: Secondary | ICD-10-CM | POA: Diagnosis not present

## 2021-07-06 DIAGNOSIS — M1990 Unspecified osteoarthritis, unspecified site: Secondary | ICD-10-CM | POA: Diagnosis not present

## 2021-07-06 DIAGNOSIS — F32A Depression, unspecified: Secondary | ICD-10-CM | POA: Diagnosis not present

## 2021-07-06 DIAGNOSIS — Z7951 Long term (current) use of inhaled steroids: Secondary | ICD-10-CM | POA: Diagnosis not present

## 2021-07-07 DIAGNOSIS — J449 Chronic obstructive pulmonary disease, unspecified: Secondary | ICD-10-CM | POA: Diagnosis not present

## 2021-07-07 DIAGNOSIS — J9611 Chronic respiratory failure with hypoxia: Secondary | ICD-10-CM | POA: Diagnosis not present

## 2021-07-14 DIAGNOSIS — D539 Nutritional anemia, unspecified: Secondary | ICD-10-CM | POA: Diagnosis not present

## 2021-07-14 DIAGNOSIS — G8929 Other chronic pain: Secondary | ICD-10-CM | POA: Diagnosis not present

## 2021-07-14 DIAGNOSIS — J9612 Chronic respiratory failure with hypercapnia: Secondary | ICD-10-CM | POA: Diagnosis not present

## 2021-07-14 DIAGNOSIS — J441 Chronic obstructive pulmonary disease with (acute) exacerbation: Secondary | ICD-10-CM | POA: Diagnosis not present

## 2021-07-14 DIAGNOSIS — K5730 Diverticulosis of large intestine without perforation or abscess without bleeding: Secondary | ICD-10-CM | POA: Diagnosis not present

## 2021-07-14 DIAGNOSIS — I509 Heart failure, unspecified: Secondary | ICD-10-CM | POA: Diagnosis not present

## 2021-07-14 DIAGNOSIS — F17200 Nicotine dependence, unspecified, uncomplicated: Secondary | ICD-10-CM | POA: Diagnosis not present

## 2021-07-14 DIAGNOSIS — M549 Dorsalgia, unspecified: Secondary | ICD-10-CM | POA: Diagnosis not present

## 2021-07-14 DIAGNOSIS — E785 Hyperlipidemia, unspecified: Secondary | ICD-10-CM | POA: Diagnosis not present

## 2021-07-14 DIAGNOSIS — I110 Hypertensive heart disease with heart failure: Secondary | ICD-10-CM | POA: Diagnosis not present

## 2021-07-14 DIAGNOSIS — Z7982 Long term (current) use of aspirin: Secondary | ICD-10-CM | POA: Diagnosis not present

## 2021-07-14 DIAGNOSIS — M1990 Unspecified osteoarthritis, unspecified site: Secondary | ICD-10-CM | POA: Diagnosis not present

## 2021-07-14 DIAGNOSIS — R339 Retention of urine, unspecified: Secondary | ICD-10-CM | POA: Diagnosis not present

## 2021-07-14 DIAGNOSIS — Z7951 Long term (current) use of inhaled steroids: Secondary | ICD-10-CM | POA: Diagnosis not present

## 2021-07-14 DIAGNOSIS — K219 Gastro-esophageal reflux disease without esophagitis: Secondary | ICD-10-CM | POA: Diagnosis not present

## 2021-07-14 DIAGNOSIS — F32A Depression, unspecified: Secondary | ICD-10-CM | POA: Diagnosis not present

## 2021-07-14 DIAGNOSIS — Z9981 Dependence on supplemental oxygen: Secondary | ICD-10-CM | POA: Diagnosis not present

## 2021-07-14 DIAGNOSIS — J449 Chronic obstructive pulmonary disease, unspecified: Secondary | ICD-10-CM | POA: Diagnosis not present

## 2021-07-14 DIAGNOSIS — J9611 Chronic respiratory failure with hypoxia: Secondary | ICD-10-CM | POA: Diagnosis not present

## 2021-07-26 DIAGNOSIS — I10 Essential (primary) hypertension: Secondary | ICD-10-CM | POA: Diagnosis not present

## 2021-07-26 DIAGNOSIS — G894 Chronic pain syndrome: Secondary | ICD-10-CM | POA: Diagnosis not present

## 2021-07-26 DIAGNOSIS — M2550 Pain in unspecified joint: Secondary | ICD-10-CM | POA: Diagnosis not present

## 2021-07-26 DIAGNOSIS — Z79899 Other long term (current) drug therapy: Secondary | ICD-10-CM | POA: Diagnosis not present

## 2021-07-27 DIAGNOSIS — Z9981 Dependence on supplemental oxygen: Secondary | ICD-10-CM | POA: Diagnosis not present

## 2021-07-27 DIAGNOSIS — J9612 Chronic respiratory failure with hypercapnia: Secondary | ICD-10-CM | POA: Diagnosis not present

## 2021-07-27 DIAGNOSIS — Z7951 Long term (current) use of inhaled steroids: Secondary | ICD-10-CM | POA: Diagnosis not present

## 2021-07-27 DIAGNOSIS — D539 Nutritional anemia, unspecified: Secondary | ICD-10-CM | POA: Diagnosis not present

## 2021-07-27 DIAGNOSIS — G8929 Other chronic pain: Secondary | ICD-10-CM | POA: Diagnosis not present

## 2021-07-27 DIAGNOSIS — K219 Gastro-esophageal reflux disease without esophagitis: Secondary | ICD-10-CM | POA: Diagnosis not present

## 2021-07-27 DIAGNOSIS — I509 Heart failure, unspecified: Secondary | ICD-10-CM | POA: Diagnosis not present

## 2021-07-27 DIAGNOSIS — K5730 Diverticulosis of large intestine without perforation or abscess without bleeding: Secondary | ICD-10-CM | POA: Diagnosis not present

## 2021-07-27 DIAGNOSIS — J441 Chronic obstructive pulmonary disease with (acute) exacerbation: Secondary | ICD-10-CM | POA: Diagnosis not present

## 2021-07-27 DIAGNOSIS — J9611 Chronic respiratory failure with hypoxia: Secondary | ICD-10-CM | POA: Diagnosis not present

## 2021-07-27 DIAGNOSIS — Z7982 Long term (current) use of aspirin: Secondary | ICD-10-CM | POA: Diagnosis not present

## 2021-07-27 DIAGNOSIS — I110 Hypertensive heart disease with heart failure: Secondary | ICD-10-CM | POA: Diagnosis not present

## 2021-07-27 DIAGNOSIS — R339 Retention of urine, unspecified: Secondary | ICD-10-CM | POA: Diagnosis not present

## 2021-07-27 DIAGNOSIS — F32A Depression, unspecified: Secondary | ICD-10-CM | POA: Diagnosis not present

## 2021-07-27 DIAGNOSIS — M1990 Unspecified osteoarthritis, unspecified site: Secondary | ICD-10-CM | POA: Diagnosis not present

## 2021-07-27 DIAGNOSIS — E785 Hyperlipidemia, unspecified: Secondary | ICD-10-CM | POA: Diagnosis not present

## 2021-07-27 DIAGNOSIS — F17200 Nicotine dependence, unspecified, uncomplicated: Secondary | ICD-10-CM | POA: Diagnosis not present

## 2021-07-27 DIAGNOSIS — M549 Dorsalgia, unspecified: Secondary | ICD-10-CM | POA: Diagnosis not present

## 2021-07-28 DIAGNOSIS — Z86010 Personal history of colonic polyps: Secondary | ICD-10-CM | POA: Diagnosis not present

## 2021-07-28 DIAGNOSIS — K649 Unspecified hemorrhoids: Secondary | ICD-10-CM | POA: Diagnosis not present

## 2021-07-28 DIAGNOSIS — Z131 Encounter for screening for diabetes mellitus: Secondary | ICD-10-CM | POA: Diagnosis not present

## 2021-07-28 DIAGNOSIS — R338 Other retention of urine: Secondary | ICD-10-CM | POA: Diagnosis not present

## 2021-07-28 DIAGNOSIS — I10 Essential (primary) hypertension: Secondary | ICD-10-CM | POA: Diagnosis not present

## 2021-07-28 DIAGNOSIS — K5909 Other constipation: Secondary | ICD-10-CM | POA: Diagnosis not present

## 2021-07-28 DIAGNOSIS — J449 Chronic obstructive pulmonary disease, unspecified: Secondary | ICD-10-CM | POA: Diagnosis not present

## 2021-07-28 DIAGNOSIS — E782 Mixed hyperlipidemia: Secondary | ICD-10-CM | POA: Diagnosis not present

## 2021-08-02 DIAGNOSIS — J449 Chronic obstructive pulmonary disease, unspecified: Secondary | ICD-10-CM | POA: Diagnosis not present

## 2021-08-02 DIAGNOSIS — J9611 Chronic respiratory failure with hypoxia: Secondary | ICD-10-CM | POA: Diagnosis not present

## 2021-08-10 DIAGNOSIS — K5730 Diverticulosis of large intestine without perforation or abscess without bleeding: Secondary | ICD-10-CM | POA: Diagnosis not present

## 2021-08-10 DIAGNOSIS — J441 Chronic obstructive pulmonary disease with (acute) exacerbation: Secondary | ICD-10-CM | POA: Diagnosis not present

## 2021-08-10 DIAGNOSIS — M1990 Unspecified osteoarthritis, unspecified site: Secondary | ICD-10-CM | POA: Diagnosis not present

## 2021-08-10 DIAGNOSIS — K219 Gastro-esophageal reflux disease without esophagitis: Secondary | ICD-10-CM | POA: Diagnosis not present

## 2021-08-10 DIAGNOSIS — M549 Dorsalgia, unspecified: Secondary | ICD-10-CM | POA: Diagnosis not present

## 2021-08-10 DIAGNOSIS — R339 Retention of urine, unspecified: Secondary | ICD-10-CM | POA: Diagnosis not present

## 2021-08-10 DIAGNOSIS — J9612 Chronic respiratory failure with hypercapnia: Secondary | ICD-10-CM | POA: Diagnosis not present

## 2021-08-10 DIAGNOSIS — F17200 Nicotine dependence, unspecified, uncomplicated: Secondary | ICD-10-CM | POA: Diagnosis not present

## 2021-08-10 DIAGNOSIS — I509 Heart failure, unspecified: Secondary | ICD-10-CM | POA: Diagnosis not present

## 2021-08-10 DIAGNOSIS — Z9981 Dependence on supplemental oxygen: Secondary | ICD-10-CM | POA: Diagnosis not present

## 2021-08-10 DIAGNOSIS — D539 Nutritional anemia, unspecified: Secondary | ICD-10-CM | POA: Diagnosis not present

## 2021-08-10 DIAGNOSIS — E785 Hyperlipidemia, unspecified: Secondary | ICD-10-CM | POA: Diagnosis not present

## 2021-08-10 DIAGNOSIS — I110 Hypertensive heart disease with heart failure: Secondary | ICD-10-CM | POA: Diagnosis not present

## 2021-08-10 DIAGNOSIS — F32A Depression, unspecified: Secondary | ICD-10-CM | POA: Diagnosis not present

## 2021-08-10 DIAGNOSIS — Z7982 Long term (current) use of aspirin: Secondary | ICD-10-CM | POA: Diagnosis not present

## 2021-08-10 DIAGNOSIS — Z7951 Long term (current) use of inhaled steroids: Secondary | ICD-10-CM | POA: Diagnosis not present

## 2021-08-10 DIAGNOSIS — J9611 Chronic respiratory failure with hypoxia: Secondary | ICD-10-CM | POA: Diagnosis not present

## 2021-08-10 DIAGNOSIS — G8929 Other chronic pain: Secondary | ICD-10-CM | POA: Diagnosis not present

## 2021-08-14 DIAGNOSIS — J449 Chronic obstructive pulmonary disease, unspecified: Secondary | ICD-10-CM | POA: Diagnosis not present

## 2021-08-14 DIAGNOSIS — J9611 Chronic respiratory failure with hypoxia: Secondary | ICD-10-CM | POA: Diagnosis not present

## 2021-08-16 ENCOUNTER — Encounter: Payer: Self-pay | Admitting: Internal Medicine

## 2021-08-24 DIAGNOSIS — J449 Chronic obstructive pulmonary disease, unspecified: Secondary | ICD-10-CM | POA: Diagnosis not present

## 2021-08-24 DIAGNOSIS — M2550 Pain in unspecified joint: Secondary | ICD-10-CM | POA: Diagnosis not present

## 2021-08-24 DIAGNOSIS — Z79899 Other long term (current) drug therapy: Secondary | ICD-10-CM | POA: Diagnosis not present

## 2021-09-14 DIAGNOSIS — J9611 Chronic respiratory failure with hypoxia: Secondary | ICD-10-CM | POA: Diagnosis not present

## 2021-09-14 DIAGNOSIS — J449 Chronic obstructive pulmonary disease, unspecified: Secondary | ICD-10-CM | POA: Diagnosis not present

## 2021-09-21 DIAGNOSIS — M2550 Pain in unspecified joint: Secondary | ICD-10-CM | POA: Diagnosis not present

## 2021-09-21 DIAGNOSIS — Z9981 Dependence on supplemental oxygen: Secondary | ICD-10-CM | POA: Diagnosis not present

## 2021-09-21 DIAGNOSIS — Z79899 Other long term (current) drug therapy: Secondary | ICD-10-CM | POA: Diagnosis not present

## 2021-09-21 DIAGNOSIS — J449 Chronic obstructive pulmonary disease, unspecified: Secondary | ICD-10-CM | POA: Diagnosis not present

## 2021-10-14 DIAGNOSIS — J449 Chronic obstructive pulmonary disease, unspecified: Secondary | ICD-10-CM | POA: Diagnosis not present

## 2021-10-14 DIAGNOSIS — J9611 Chronic respiratory failure with hypoxia: Secondary | ICD-10-CM | POA: Diagnosis not present

## 2021-10-19 ENCOUNTER — Encounter: Payer: Self-pay | Admitting: Internal Medicine

## 2021-10-19 ENCOUNTER — Other Ambulatory Visit: Payer: Self-pay

## 2021-10-19 ENCOUNTER — Ambulatory Visit (INDEPENDENT_AMBULATORY_CARE_PROVIDER_SITE_OTHER): Payer: Medicare Other | Admitting: Internal Medicine

## 2021-10-19 ENCOUNTER — Telehealth: Payer: Self-pay | Admitting: *Deleted

## 2021-10-19 DIAGNOSIS — M2550 Pain in unspecified joint: Secondary | ICD-10-CM | POA: Diagnosis not present

## 2021-10-19 DIAGNOSIS — K5900 Constipation, unspecified: Secondary | ICD-10-CM

## 2021-10-19 DIAGNOSIS — Z79899 Other long term (current) drug therapy: Secondary | ICD-10-CM | POA: Diagnosis not present

## 2021-10-19 DIAGNOSIS — R609 Edema, unspecified: Secondary | ICD-10-CM | POA: Diagnosis not present

## 2021-10-19 DIAGNOSIS — J449 Chronic obstructive pulmonary disease, unspecified: Secondary | ICD-10-CM | POA: Diagnosis not present

## 2021-10-19 NOTE — Telephone Encounter (Signed)
[name], you are scheduled for a virtual visit with your provider today.  Just as we do with appointments in the office, we must obtain your consent to participate.  Your consent will be active for this visit and any virtual visit you may have with one of our providers in the next 365 days.  If you have a MyChart account, I can also send a copy of this consent to you electronically.  All virtual visits are billed to your insurance company just like a traditional visit in the office.  As this is a virtual visit, video technology does not allow for your provider to perform a traditional examination.  This may limit your provider's ability to fully assess your condition.  If your provider identifies any concerns that need to be evaluated in person or the need to arrange testing such as labs, EKG, etc, we will make arrangements to do so.  Although advances in technology are sophisticated, we cannot ensure that it will always work on either your end or our end.  If the connection with a video visit is poor, we may have to switch to a telephone visit.  With either a video or telephone visit, we are not always able to ensure that we have a secure connection.   I need to obtain your verbal consent now.   Are you willing to proceed with your visit today?

## 2021-10-19 NOTE — Patient Instructions (Signed)
It was good to speak with you today!  As discussed, we take MiraLAX 17 g orally at bedtime every night.  Would hold only for diarrhea.  We will touch base with you virtually in 3 months.  If you have any interim problems, please do not hesitate to reach out.

## 2021-10-19 NOTE — Telephone Encounter (Signed)
Pt consented to a telephone visit. °

## 2021-10-19 NOTE — Progress Notes (Signed)
Referring Provider:  Primary Care Physician:  Asencion Noble, MD  Primary GI:   Patient Location: Home   Provider Location: [place] office   Reason for Visit:    Persons present on the virtual encounter, with roles:    Total time (minutes) spent on medical discussion: 7 minutes   Due to COVID-19, visit was conducted using virtual method.  Visit was requested by patient.  Virtual Visit via MyChart Video Note Due to COVID-19, visit is conducted virtually and was requested by patient.   I connected with [name] on 10/19/21 at 11:30 AM EDT by telephone and verified that I am speaking with the correct person using two identifiers.   I discussed the limitations, risks, security and privacy concerns of performing an evaluation and management service by telephone and the availability of in person appointments. I also discussed with the patient that there may be a patient responsible charge related to this service. The patient expressed understanding and agreed to proceed.  Chief Complaint  Patient presents with   Constipation    Unable to afford Linzess. Taking Miralax most days, but sometimes forgets. Eating Activia yogurt every morning and eating more fiber in diet-helps.     History of Present Illness:   70 year old lady with opioid-induced constipation.  History of being suboptimally managed with only sporadic use of MiraLAX.  Also, has been on Linzess with sporadic results due to sporadic usage.  Cannot afford Linzess.  Movantik and Amitiza also not options for financial reasons.  Takes MiraLAX as needed nowadays and has 2-3 bowel movements a week.  She is not passing any blood.  Colonoscopy earlier this year demonstrated diverticulosis and an adenoma which was removed.  Due for surveillance in 5 years.     Past Medical History:  Diagnosis Date   Anxiety    Atrophic vaginitis 10/09/2015   Baker's cyst    left leg   Blood in urine 10/09/2015   CHF (congestive heart  failure) (HCC)    COPD (chronic obstructive pulmonary disease) (HCC)    Heart murmur    Hematuria 10/09/2015   Hypertension    Nicotine addiction 04/07/2014   Pneumonia    Urinary urgency 01/25/2016   Vaginal bleeding 10/09/2015     Past Surgical History:  Procedure Laterality Date   ABDOMINAL HYSTERECTOMY     CATARACT EXTRACTION Left 03/2015   CESAREAN SECTION     COLONOSCOPY  10/19/2004   Dr. Gala Romney- internal hemorrhoids, o/w normal rectum, normal colon   COLONOSCOPY N/A 07/30/2014   WFU:XNATFTD diverticulosis. Colonic polyp-removed TA. repeat TCS 07/2021   COLONOSCOPY WITH PROPOFOL N/A 03/29/2021   diverticulosis in sigmoid and descending colon, one 5 mm polyp ascending colon (adenoma), non-bleeding internal hemorrhoids. 5 year surveillance   ESOPHAGOGASTRODUODENOSCOPY N/A 07/30/2014   DUK:GURKYH EGD   POLYPECTOMY  03/29/2021   Procedure: POLYPECTOMY;  Surgeon: Daneil Dolin, MD;  Location: [facility];  Service: Endoscopy;;   RADIAL HEAD ARTHROPLASTY Left 06/23/2017   Procedure: RADIAL HEAD ARTHROPLASTY WITH LIGAMENT REPAIR;  Surgeon: Meredith Pel, MD;  Location: [place];  Service: Orthopedics;  Laterality: Left;     Current Meds  Medication Sig   albuterol (PROVENTIL HFA;VENTOLIN HFA) 108 (90 BASE) MCG/ACT inhaler Inhale 2 puffs into the lungs every 6 (six) hours as needed for wheezing or shortness of breath. For shortness of breath   aspirin EC 81 MG tablet Take 81 mg by mouth in the morning. Swallow whole.   atorvastatin (LIPITOR) 40  MG tablet Take 40 mg by mouth at bedtime.   CARTIA XT 180 MG 24 hr capsule Take 180 mg by mouth 2 (two) times daily.    HYDROcodone-acetaminophen (NORCO) 10-325 MG tablet Take 1 tablet by mouth 4 (four) times daily as needed.   losartan-hydrochlorothiazide (HYZAAR) 100-25 MG tablet Take 1 tablet by mouth in the morning.   mirtazapine (REMERON) 7.5 MG tablet Take 7.5 mg by mouth at bedtime.   Multiple Vitamin (MULTIVITAMIN WITH MINERALS) TABS  tablet Take 1 tablet by mouth in the morning.   polyethylene glycol (MIRALAX / GLYCOLAX) 17 g packet Take 17 g by mouth daily as needed. Sometimes forgets to take everyday.   tamsulosin (FLOMAX) 0.4 MG CAPS capsule TAKE 1 CAPSULE(0.4 MG) BY MOUTH TWICE DAILY   TRELEGY ELLIPTA 100-62.5-25 MCG/INH AEPB Take 1 puff by mouth in the morning.   vitamin B-12 1000 MCG tablet Take 1 tablet (1,000 mcg total) by mouth daily.     Family History  Adopted: Yes  Problem Relation Age of Onset   Cancer Sister        pancreatic   Migraines Daughter    Cancer Daughter        pre cancerous cells on cervix   Cancer Sister        breast cancer, colon   Blindness Maternal Grandfather    Other Brother        murdered   Other Sister        ruptured colon   Cancer Sister        breast, skin   Other Brother        was in MVA   Cancer Sister        pancreatic   Hypertension Sister    Other Sister        ruptured colon   Colon cancer Neg Hx     Social History   Socioeconomic History   Marital status: Single    Spouse name: Not on file   Number of children: Not on file   Years of education: Not on file   Highest education level: Not on file  Occupational History   Occupation: retired  Tobacco Use   Smoking status: Former    Years: 40.00    Types: Cigarettes    Quit date: 04/14/2013    Years since quitting: 8.5   Smokeless tobacco: Never  Substance and Sexual Activity   Alcohol use: Yes    Comment: glass of wine maybe once a year   Drug use: No   Sexual activity: Never    Birth control/protection: Surgical    Comment: hyst  Other Topics Concern   Not on file  Social History Narrative   Not on file   Social Determinants of Health   Financial Resource Strain: Not on file  Food Insecurity: Not on file  Transportation Needs: Not on file  Physical Activity: Not on file  Stress: Not on file  Social Connections: Not on file       Review of Systems:  As in history of present  illness .  Observations/Objective:  No distress. Unable to perform physical exam due to telephone encounter. No video available.   Assessment and Plan: 70 year old lady with chronic opioid induced constipation.  Suboptimally managed due to combination of lack of medication availability and compliance.  She is actually managing fairly well with MiraLAX.  I recommend she liberalize use of MiraLAX taking it up to once daily to achieve a bowel movement  at least daily to every other day.  Hold the medication only for diarrhea.  History of colonic adenoma; due for surveillance colonoscopy in 5 years.   Follow Up Instructions:  As discussed, take MiraLAX 17 g orally at bedtime every night.  Would hold only for diarrhea.  We will touch virtually in 3 months.  If any interim problems,   Patient is to reach out.    I discussed the assessment and treatment plan with the patient. The patient was provided an opportunity to ask questions and all were answered. The patient agreed with the plan and demonstrated an understanding of the instructions.   The patient was advised to call back or seek an in-person evaluation if the symptoms worsen or if the condition fails to improve as anticipated.  I provided 7 minutes of telephone visitation today.   Bridgette Habermann, MD [facility]

## 2021-11-12 DIAGNOSIS — Z79899 Other long term (current) drug therapy: Secondary | ICD-10-CM | POA: Diagnosis not present

## 2021-11-12 DIAGNOSIS — M2550 Pain in unspecified joint: Secondary | ICD-10-CM | POA: Diagnosis not present

## 2021-11-12 DIAGNOSIS — J449 Chronic obstructive pulmonary disease, unspecified: Secondary | ICD-10-CM | POA: Diagnosis not present

## 2021-11-12 DIAGNOSIS — Z9981 Dependence on supplemental oxygen: Secondary | ICD-10-CM | POA: Diagnosis not present

## 2021-12-08 ENCOUNTER — Other Ambulatory Visit: Payer: Self-pay | Admitting: Urology

## 2021-12-09 DIAGNOSIS — M810 Age-related osteoporosis without current pathological fracture: Secondary | ICD-10-CM | POA: Diagnosis not present

## 2021-12-09 DIAGNOSIS — M2550 Pain in unspecified joint: Secondary | ICD-10-CM | POA: Diagnosis not present

## 2021-12-09 DIAGNOSIS — Z79899 Other long term (current) drug therapy: Secondary | ICD-10-CM | POA: Diagnosis not present

## 2021-12-09 DIAGNOSIS — J449 Chronic obstructive pulmonary disease, unspecified: Secondary | ICD-10-CM | POA: Diagnosis not present

## 2021-12-09 NOTE — Telephone Encounter (Signed)
Pt needs appt for additional RFs

## 2022-01-06 DIAGNOSIS — Z79899 Other long term (current) drug therapy: Secondary | ICD-10-CM | POA: Diagnosis not present

## 2022-01-06 DIAGNOSIS — J449 Chronic obstructive pulmonary disease, unspecified: Secondary | ICD-10-CM | POA: Diagnosis not present

## 2022-01-06 DIAGNOSIS — Z9981 Dependence on supplemental oxygen: Secondary | ICD-10-CM | POA: Diagnosis not present

## 2022-01-06 DIAGNOSIS — M2550 Pain in unspecified joint: Secondary | ICD-10-CM | POA: Diagnosis not present

## 2022-01-25 ENCOUNTER — Encounter: Payer: Self-pay | Admitting: Internal Medicine

## 2022-02-23 ENCOUNTER — Other Ambulatory Visit (HOSPITAL_COMMUNITY): Payer: Self-pay | Admitting: Family Medicine

## 2022-02-23 DIAGNOSIS — Z1231 Encounter for screening mammogram for malignant neoplasm of breast: Secondary | ICD-10-CM

## 2022-02-23 DIAGNOSIS — K5909 Other constipation: Secondary | ICD-10-CM | POA: Diagnosis not present

## 2022-02-23 DIAGNOSIS — M24849 Other specific joint derangements of unspecified hand, not elsewhere classified: Secondary | ICD-10-CM | POA: Diagnosis not present

## 2022-02-23 DIAGNOSIS — R338 Other retention of urine: Secondary | ICD-10-CM | POA: Diagnosis not present

## 2022-02-23 DIAGNOSIS — Z86010 Personal history of colonic polyps: Secondary | ICD-10-CM | POA: Diagnosis not present

## 2022-02-23 DIAGNOSIS — Z79899 Other long term (current) drug therapy: Secondary | ICD-10-CM | POA: Diagnosis not present

## 2022-02-23 DIAGNOSIS — R5383 Other fatigue: Secondary | ICD-10-CM | POA: Diagnosis not present

## 2022-02-23 DIAGNOSIS — J449 Chronic obstructive pulmonary disease, unspecified: Secondary | ICD-10-CM | POA: Diagnosis not present

## 2022-02-23 DIAGNOSIS — E782 Mixed hyperlipidemia: Secondary | ICD-10-CM | POA: Diagnosis not present

## 2022-02-23 DIAGNOSIS — G894 Chronic pain syndrome: Secondary | ICD-10-CM | POA: Diagnosis not present

## 2022-02-23 DIAGNOSIS — I10 Essential (primary) hypertension: Secondary | ICD-10-CM | POA: Diagnosis not present

## 2022-02-23 DIAGNOSIS — R7301 Impaired fasting glucose: Secondary | ICD-10-CM | POA: Diagnosis not present

## 2022-02-25 DIAGNOSIS — I10 Essential (primary) hypertension: Secondary | ICD-10-CM | POA: Diagnosis not present

## 2022-03-24 DIAGNOSIS — H02413 Mechanical ptosis of bilateral eyelids: Secondary | ICD-10-CM | POA: Diagnosis not present

## 2022-03-24 DIAGNOSIS — H02423 Myogenic ptosis of bilateral eyelids: Secondary | ICD-10-CM | POA: Diagnosis not present

## 2022-03-24 DIAGNOSIS — H04123 Dry eye syndrome of bilateral lacrimal glands: Secondary | ICD-10-CM | POA: Diagnosis not present

## 2022-03-24 DIAGNOSIS — H02831 Dermatochalasis of right upper eyelid: Secondary | ICD-10-CM | POA: Diagnosis not present

## 2022-03-24 DIAGNOSIS — H57813 Brow ptosis, bilateral: Secondary | ICD-10-CM | POA: Diagnosis not present

## 2022-03-24 DIAGNOSIS — H02834 Dermatochalasis of left upper eyelid: Secondary | ICD-10-CM | POA: Diagnosis not present

## 2022-03-24 DIAGNOSIS — E7800 Pure hypercholesterolemia, unspecified: Secondary | ICD-10-CM | POA: Insufficient documentation

## 2022-03-24 DIAGNOSIS — H0279 Other degenerative disorders of eyelid and periocular area: Secondary | ICD-10-CM | POA: Diagnosis not present

## 2022-03-24 DIAGNOSIS — H53483 Generalized contraction of visual field, bilateral: Secondary | ICD-10-CM | POA: Diagnosis not present

## 2022-04-11 DIAGNOSIS — H53483 Generalized contraction of visual field, bilateral: Secondary | ICD-10-CM | POA: Diagnosis not present

## 2022-04-28 ENCOUNTER — Ambulatory Visit (HOSPITAL_COMMUNITY): Payer: Medicare Other

## 2022-05-25 DIAGNOSIS — E559 Vitamin D deficiency, unspecified: Secondary | ICD-10-CM | POA: Diagnosis not present

## 2022-05-25 DIAGNOSIS — R5383 Other fatigue: Secondary | ICD-10-CM | POA: Diagnosis not present

## 2022-05-25 DIAGNOSIS — G894 Chronic pain syndrome: Secondary | ICD-10-CM | POA: Diagnosis not present

## 2022-05-25 DIAGNOSIS — J449 Chronic obstructive pulmonary disease, unspecified: Secondary | ICD-10-CM | POA: Diagnosis not present

## 2022-06-29 ENCOUNTER — Inpatient Hospital Stay (HOSPITAL_COMMUNITY)
Admission: EM | Admit: 2022-06-29 | Discharge: 2022-07-07 | DRG: 871 | Disposition: A | Discharge status: Skilled Nursing Facility | Payer: Medicare Other | Attending: Student | Admitting: Student

## 2022-06-29 ENCOUNTER — Encounter (HOSPITAL_COMMUNITY): Payer: Self-pay

## 2022-06-29 ENCOUNTER — Emergency Department (HOSPITAL_COMMUNITY): Payer: Medicare Other

## 2022-06-29 DIAGNOSIS — R339 Retention of urine, unspecified: Secondary | ICD-10-CM | POA: Diagnosis not present

## 2022-06-29 DIAGNOSIS — N10 Acute tubulo-interstitial nephritis: Secondary | ICD-10-CM | POA: Diagnosis present

## 2022-06-29 DIAGNOSIS — Z7982 Long term (current) use of aspirin: Secondary | ICD-10-CM

## 2022-06-29 DIAGNOSIS — R739 Hyperglycemia, unspecified: Secondary | ICD-10-CM | POA: Diagnosis not present

## 2022-06-29 DIAGNOSIS — R7989 Other specified abnormal findings of blood chemistry: Secondary | ICD-10-CM | POA: Diagnosis not present

## 2022-06-29 DIAGNOSIS — J984 Other disorders of lung: Secondary | ICD-10-CM | POA: Diagnosis not present

## 2022-06-29 DIAGNOSIS — R778 Other specified abnormalities of plasma proteins: Secondary | ICD-10-CM | POA: Diagnosis not present

## 2022-06-29 DIAGNOSIS — R262 Difficulty in walking, not elsewhere classified: Secondary | ICD-10-CM | POA: Diagnosis not present

## 2022-06-29 DIAGNOSIS — N2881 Hypertrophy of kidney: Secondary | ICD-10-CM | POA: Diagnosis not present

## 2022-06-29 DIAGNOSIS — R911 Solitary pulmonary nodule: Secondary | ICD-10-CM | POA: Diagnosis not present

## 2022-06-29 DIAGNOSIS — Z9981 Dependence on supplemental oxygen: Secondary | ICD-10-CM | POA: Diagnosis not present

## 2022-06-29 DIAGNOSIS — N179 Acute kidney failure, unspecified: Secondary | ICD-10-CM | POA: Diagnosis present

## 2022-06-29 DIAGNOSIS — E876 Hypokalemia: Secondary | ICD-10-CM | POA: Diagnosis not present

## 2022-06-29 DIAGNOSIS — J9621 Acute and chronic respiratory failure with hypoxia: Secondary | ICD-10-CM | POA: Diagnosis present

## 2022-06-29 DIAGNOSIS — N390 Urinary tract infection, site not specified: Secondary | ICD-10-CM | POA: Diagnosis present

## 2022-06-29 DIAGNOSIS — G9341 Metabolic encephalopathy: Secondary | ICD-10-CM | POA: Diagnosis not present

## 2022-06-29 DIAGNOSIS — R6520 Severe sepsis without septic shock: Secondary | ICD-10-CM | POA: Diagnosis not present

## 2022-06-29 DIAGNOSIS — M4807 Spinal stenosis, lumbosacral region: Secondary | ICD-10-CM | POA: Diagnosis present

## 2022-06-29 DIAGNOSIS — K5730 Diverticulosis of large intestine without perforation or abscess without bleeding: Secondary | ICD-10-CM | POA: Diagnosis not present

## 2022-06-29 DIAGNOSIS — M545 Low back pain, unspecified: Secondary | ICD-10-CM

## 2022-06-29 DIAGNOSIS — Z743 Need for continuous supervision: Secondary | ICD-10-CM | POA: Diagnosis not present

## 2022-06-29 DIAGNOSIS — R109 Unspecified abdominal pain: Secondary | ICD-10-CM | POA: Diagnosis not present

## 2022-06-29 DIAGNOSIS — R4182 Altered mental status, unspecified: Secondary | ICD-10-CM

## 2022-06-29 DIAGNOSIS — M5126 Other intervertebral disc displacement, lumbar region: Secondary | ICD-10-CM | POA: Diagnosis not present

## 2022-06-29 DIAGNOSIS — Z20822 Contact with and (suspected) exposure to covid-19: Secondary | ICD-10-CM | POA: Diagnosis not present

## 2022-06-29 DIAGNOSIS — M6281 Muscle weakness (generalized): Secondary | ICD-10-CM | POA: Diagnosis not present

## 2022-06-29 DIAGNOSIS — A419 Sepsis, unspecified organism: Secondary | ICD-10-CM | POA: Diagnosis not present

## 2022-06-29 DIAGNOSIS — J90 Pleural effusion, not elsewhere classified: Secondary | ICD-10-CM | POA: Diagnosis not present

## 2022-06-29 DIAGNOSIS — Z7401 Bed confinement status: Secondary | ICD-10-CM | POA: Diagnosis not present

## 2022-06-29 DIAGNOSIS — I110 Hypertensive heart disease with heart failure: Secondary | ICD-10-CM | POA: Diagnosis present

## 2022-06-29 DIAGNOSIS — M47814 Spondylosis without myelopathy or radiculopathy, thoracic region: Secondary | ICD-10-CM | POA: Diagnosis not present

## 2022-06-29 DIAGNOSIS — K5790 Diverticulosis of intestine, part unspecified, without perforation or abscess without bleeding: Secondary | ICD-10-CM | POA: Diagnosis present

## 2022-06-29 DIAGNOSIS — N3001 Acute cystitis with hematuria: Secondary | ICD-10-CM | POA: Diagnosis not present

## 2022-06-29 DIAGNOSIS — B9620 Unspecified Escherichia coli [E. coli] as the cause of diseases classified elsewhere: Secondary | ICD-10-CM | POA: Diagnosis present

## 2022-06-29 DIAGNOSIS — R410 Disorientation, unspecified: Secondary | ICD-10-CM | POA: Diagnosis not present

## 2022-06-29 DIAGNOSIS — E871 Hypo-osmolality and hyponatremia: Secondary | ICD-10-CM | POA: Diagnosis not present

## 2022-06-29 DIAGNOSIS — M47816 Spondylosis without myelopathy or radiculopathy, lumbar region: Secondary | ICD-10-CM | POA: Diagnosis not present

## 2022-06-29 DIAGNOSIS — R0689 Other abnormalities of breathing: Secondary | ICD-10-CM | POA: Diagnosis not present

## 2022-06-29 DIAGNOSIS — J449 Chronic obstructive pulmonary disease, unspecified: Secondary | ICD-10-CM | POA: Diagnosis not present

## 2022-06-29 DIAGNOSIS — Z8249 Family history of ischemic heart disease and other diseases of the circulatory system: Secondary | ICD-10-CM

## 2022-06-29 DIAGNOSIS — I248 Other forms of acute ischemic heart disease: Secondary | ICD-10-CM | POA: Diagnosis not present

## 2022-06-29 DIAGNOSIS — R509 Fever, unspecified: Secondary | ICD-10-CM | POA: Diagnosis not present

## 2022-06-29 DIAGNOSIS — N139 Obstructive and reflux uropathy, unspecified: Secondary | ICD-10-CM | POA: Diagnosis not present

## 2022-06-29 DIAGNOSIS — Z800 Family history of malignant neoplasm of digestive organs: Secondary | ICD-10-CM

## 2022-06-29 DIAGNOSIS — J441 Chronic obstructive pulmonary disease with (acute) exacerbation: Secondary | ICD-10-CM | POA: Diagnosis present

## 2022-06-29 DIAGNOSIS — I700 Atherosclerosis of aorta: Secondary | ICD-10-CM | POA: Diagnosis present

## 2022-06-29 DIAGNOSIS — A4151 Sepsis due to Escherichia coli [E. coli]: Secondary | ICD-10-CM | POA: Diagnosis present

## 2022-06-29 DIAGNOSIS — Z87891 Personal history of nicotine dependence: Secondary | ICD-10-CM

## 2022-06-29 DIAGNOSIS — J9690 Respiratory failure, unspecified, unspecified whether with hypoxia or hypercapnia: Secondary | ICD-10-CM | POA: Diagnosis not present

## 2022-06-29 DIAGNOSIS — J929 Pleural plaque without asbestos: Secondary | ICD-10-CM | POA: Diagnosis not present

## 2022-06-29 DIAGNOSIS — E785 Hyperlipidemia, unspecified: Secondary | ICD-10-CM | POA: Diagnosis not present

## 2022-06-29 DIAGNOSIS — I10 Essential (primary) hypertension: Secondary | ICD-10-CM | POA: Diagnosis not present

## 2022-06-29 DIAGNOSIS — E860 Dehydration: Secondary | ICD-10-CM | POA: Diagnosis present

## 2022-06-29 DIAGNOSIS — J9622 Acute and chronic respiratory failure with hypercapnia: Secondary | ICD-10-CM | POA: Diagnosis present

## 2022-06-29 DIAGNOSIS — E669 Obesity, unspecified: Secondary | ICD-10-CM | POA: Diagnosis present

## 2022-06-29 DIAGNOSIS — I517 Cardiomegaly: Secondary | ICD-10-CM | POA: Diagnosis not present

## 2022-06-29 DIAGNOSIS — Z7951 Long term (current) use of inhaled steroids: Secondary | ICD-10-CM

## 2022-06-29 DIAGNOSIS — G8929 Other chronic pain: Secondary | ICD-10-CM

## 2022-06-29 DIAGNOSIS — R531 Weakness: Secondary | ICD-10-CM | POA: Diagnosis not present

## 2022-06-29 DIAGNOSIS — G894 Chronic pain syndrome: Secondary | ICD-10-CM | POA: Diagnosis not present

## 2022-06-29 DIAGNOSIS — J9811 Atelectasis: Secondary | ICD-10-CM | POA: Diagnosis not present

## 2022-06-29 DIAGNOSIS — R7881 Bacteremia: Secondary | ICD-10-CM | POA: Diagnosis present

## 2022-06-29 DIAGNOSIS — R079 Chest pain, unspecified: Secondary | ICD-10-CM | POA: Diagnosis not present

## 2022-06-29 DIAGNOSIS — I499 Cardiac arrhythmia, unspecified: Secondary | ICD-10-CM | POA: Diagnosis not present

## 2022-06-29 DIAGNOSIS — J690 Pneumonitis due to inhalation of food and vomit: Secondary | ICD-10-CM | POA: Diagnosis not present

## 2022-06-29 DIAGNOSIS — D649 Anemia, unspecified: Secondary | ICD-10-CM

## 2022-06-29 DIAGNOSIS — I5033 Acute on chronic diastolic (congestive) heart failure: Secondary | ICD-10-CM | POA: Diagnosis not present

## 2022-06-29 DIAGNOSIS — F419 Anxiety disorder, unspecified: Secondary | ICD-10-CM | POA: Diagnosis present

## 2022-06-29 DIAGNOSIS — R278 Other lack of coordination: Secondary | ICD-10-CM | POA: Diagnosis not present

## 2022-06-29 DIAGNOSIS — J432 Centrilobular emphysema: Secondary | ICD-10-CM | POA: Diagnosis present

## 2022-06-29 DIAGNOSIS — Z803 Family history of malignant neoplasm of breast: Secondary | ICD-10-CM

## 2022-06-29 DIAGNOSIS — R2681 Unsteadiness on feet: Secondary | ICD-10-CM | POA: Diagnosis not present

## 2022-06-29 DIAGNOSIS — J439 Emphysema, unspecified: Secondary | ICD-10-CM | POA: Diagnosis not present

## 2022-06-29 DIAGNOSIS — R6889 Other general symptoms and signs: Secondary | ICD-10-CM | POA: Diagnosis not present

## 2022-06-29 DIAGNOSIS — Z6828 Body mass index (BMI) 28.0-28.9, adult: Secondary | ICD-10-CM

## 2022-06-29 DIAGNOSIS — R0602 Shortness of breath: Secondary | ICD-10-CM | POA: Diagnosis present

## 2022-06-29 DIAGNOSIS — Z79899 Other long term (current) drug therapy: Secondary | ICD-10-CM

## 2022-06-29 DIAGNOSIS — R293 Abnormal posture: Secondary | ICD-10-CM | POA: Diagnosis not present

## 2022-06-29 DIAGNOSIS — E279 Disorder of adrenal gland, unspecified: Secondary | ICD-10-CM | POA: Diagnosis present

## 2022-06-29 LAB — COMPREHENSIVE METABOLIC PANEL
ALT: 16 U/L (ref 0–44)
AST: 24 U/L (ref 15–41)
Albumin: 3.2 g/dL — ABNORMAL LOW (ref 3.5–5.0)
Alkaline Phosphatase: 65 U/L (ref 38–126)
Anion gap: 13 (ref 5–15)
BUN: 26 mg/dL — ABNORMAL HIGH (ref 8–23)
CO2: 33 mmol/L — ABNORMAL HIGH (ref 22–32)
Calcium: 8.2 mg/dL — ABNORMAL LOW (ref 8.9–10.3)
Chloride: 85 mmol/L — ABNORMAL LOW (ref 98–111)
Creatinine, Ser: 1.29 mg/dL — ABNORMAL HIGH (ref 0.44–1.00)
GFR, Estimated: 45 mL/min — ABNORMAL LOW (ref >60)
Glucose, Bld: 152 mg/dL — ABNORMAL HIGH (ref 70–99)
Potassium: 3.4 mmol/L — ABNORMAL LOW (ref 3.5–5.1)
Sodium: 131 mmol/L — ABNORMAL LOW (ref 135–145)
Total Bilirubin: 0.4 mg/dL (ref 0.3–1.2)
Total Protein: 6.4 g/dL — ABNORMAL LOW (ref 6.5–8.1)

## 2022-06-29 LAB — CBC WITH DIFFERENTIAL/PLATELET
Abs Immature Granulocytes: 0.15 10*3/uL — ABNORMAL HIGH (ref 0.00–0.07)
Basophils Absolute: 0.1 10*3/uL (ref 0.0–0.1)
Basophils Relative: 0 %
Eosinophils Absolute: 0 10*3/uL (ref 0.0–0.5)
Eosinophils Relative: 0 %
HCT: 34.3 % — ABNORMAL LOW (ref 36.0–46.0)
Hemoglobin: 10.3 g/dL — ABNORMAL LOW (ref 12.0–15.0)
Immature Granulocytes: 1 %
Lymphocytes Relative: 5 %
Lymphs Abs: 1 10*3/uL (ref 0.7–4.0)
MCH: 29.2 pg (ref 26.0–34.0)
MCHC: 30 g/dL (ref 30.0–36.0)
MCV: 97.2 fL (ref 80.0–100.0)
Monocytes Absolute: 1.6 10*3/uL — ABNORMAL HIGH (ref 0.1–1.0)
Monocytes Relative: 7 %
Neutro Abs: 19.4 10*3/uL — ABNORMAL HIGH (ref 1.7–7.7)
Neutrophils Relative %: 87 %
Platelets: 160 10*3/uL (ref 150–400)
RBC: 3.53 MIL/uL — ABNORMAL LOW (ref 3.87–5.11)
RDW: 12.8 % (ref 11.5–15.5)
WBC: 22.2 10*3/uL — ABNORMAL HIGH (ref 4.0–10.5)
nRBC: 0 % (ref 0.0–0.2)

## 2022-06-29 LAB — RESP PANEL BY RT-PCR (FLU A&B, COVID) ARPGX2
Influenza A by PCR: NEGATIVE
Influenza B by PCR: NEGATIVE
SARS Coronavirus 2 by RT PCR: NEGATIVE

## 2022-06-29 LAB — LACTIC ACID, PLASMA: Lactic Acid, Venous: 1.4 mmol/L (ref 0.5–1.9)

## 2022-06-29 LAB — APTT: aPTT: 29 seconds (ref 24–36)

## 2022-06-29 LAB — PROTIME-INR
INR: 1.2 (ref 0.8–1.2)
Prothrombin Time: 14.9 seconds (ref 11.4–15.2)

## 2022-06-29 LAB — TROPONIN I (HIGH SENSITIVITY): Troponin I (High Sensitivity): 896 ng/L (ref <18)

## 2022-06-29 MED ORDER — VANCOMYCIN HCL IN DEXTROSE 1-5 GM/200ML-% IV SOLN
1000.0000 mg | Freq: Once | INTRAVENOUS | Status: AC
  Administered 2022-06-29: 1000 mg via INTRAVENOUS
  Filled 2022-06-29: qty 200

## 2022-06-29 MED ORDER — HEPARIN (PORCINE) 25000 UT/250ML-% IV SOLN
1200.0000 [IU]/h | INTRAVENOUS | Status: AC
  Administered 2022-06-29: 850 [IU]/h via INTRAVENOUS
  Filled 2022-06-29 (×2): qty 250

## 2022-06-29 MED ORDER — HEPARIN BOLUS VIA INFUSION
4000.0000 [IU] | Freq: Once | INTRAVENOUS | Status: AC
  Administered 2022-06-29: 4000 [IU] via INTRAVENOUS
  Filled 2022-06-29: qty 4000

## 2022-06-29 MED ORDER — LACTATED RINGERS IV BOLUS (SEPSIS)
1000.0000 mL | Freq: Once | INTRAVENOUS | Status: AC
  Administered 2022-06-29: 1000 mL via INTRAVENOUS

## 2022-06-29 MED ORDER — LACTATED RINGERS IV BOLUS (SEPSIS)
1000.0000 mL | Freq: Once | INTRAVENOUS | Status: AC
Start: 2022-06-29 — End: 2022-06-29
  Administered 2022-06-29: 1000 mL via INTRAVENOUS

## 2022-06-29 MED ORDER — SODIUM CHLORIDE 0.9 % IV SOLN
2.0000 g | Freq: Once | INTRAVENOUS | Status: AC
  Administered 2022-06-29: 2 g via INTRAVENOUS
  Filled 2022-06-29: qty 12.5

## 2022-06-29 MED ORDER — ACETAMINOPHEN 500 MG PO TABS
1000.0000 mg | ORAL_TABLET | Freq: Once | ORAL | Status: AC
  Administered 2022-06-29: 1000 mg via ORAL
  Filled 2022-06-29: qty 2

## 2022-06-29 MED ORDER — ASPIRIN 325 MG PO TABS: 325.0000 mg | ORAL_TABLET | Freq: Every day | ORAL | Status: DC

## 2022-06-29 MED ORDER — LACTATED RINGERS IV BOLUS (SEPSIS): 250.0000 mL | Freq: Once | INTRAVENOUS | Status: DC

## 2022-06-29 MED ORDER — LACTATED RINGERS IV SOLN
INTRAVENOUS | Status: AC
Start: 2022-06-29 — End: 2022-06-30

## 2022-06-29 NOTE — ED Provider Notes (Signed)
[facility] EMERGENCY DEPARTMENT Provider Note   CSN: [id number] Arrival date & time: 06/29/22  2040     History  Chief Complaint  Patient presents with   Shortness of Breath   Chest Pain   Altered Mental Status    [name] is a 71 y.o. female.   Shortness of Breath Associated symptoms: chest pain   Chest Pain Associated symptoms: altered mental status and shortness of breath   Altered Mental Status   Patient presents from home by EMS due to shortness of breath and altered mental status.  At baseline patient is on 2 L supplemental air secondary to COPD, started feeling short of breath last night and having chest pain.  That was her last known well.  This morning patient has been confused, was found to be febrile by EMS.  She was short of breath and wheezing, given Solu-Medrol 125, 2 DuoNebs and mag in route by EMS.  Patient is confused unable able to tell me much history, she states she has pain everywhere when I ask.  Past medical history of COPD, hypertension, CHF.   Home Medications Prior to Admission medications   Medication Sig Start Date End Date Taking? Authorizing Provider  albuterol (PROVENTIL HFA;VENTOLIN HFA) 108 (90 BASE) MCG/ACT inhaler Inhale 2 puffs into the lungs every 6 (six) hours as needed for wheezing or shortness of breath. For shortness of breath    [provider]  aspirin EC 81 MG tablet Take 81 mg by mouth in the morning. Swallow whole.    [provider]  atorvastatin (LIPITOR) 40 MG tablet Take 40 mg by mouth at bedtime. 09/28/20   [provider]  CARTIA XT 180 MG 24 hr capsule Take 180 mg by mouth 2 (two) times daily.  09/20/18   [provider]  HYDROcodone-acetaminophen (NORCO) 10-325 MG tablet Take 1 tablet by mouth 4 (four) times daily as needed. 03/02/20   [provider]  losartan-hydrochlorothiazide (HYZAAR) 100-25 MG tablet Take 1 tablet by mouth in the morning. 12/27/19    [provider]  mirtazapine (REMERON) 7.5 MG tablet Take 7.5 mg by mouth at bedtime. 02/25/20   [provider]  Multiple Vitamin (MULTIVITAMIN WITH MINERALS) TABS tablet Take 1 tablet by mouth in the morning.    [provider]  polyethylene glycol (MIRALAX / GLYCOLAX) 17 g packet Take 17 g by mouth daily as needed. Sometimes forgets to take everyday.    [provider]  tamsulosin (FLOMAX) 0.4 MG CAPS capsule TAKE 1 CAPSULE(0.4 MG) BY MOUTH TWICE DAILY 12/09/21   Summerlin, Julienne [place], PA-C  TRELEGY ELLIPTA 100-62.5-25 MCG/INH AEPB Take 1 puff by mouth in the morning. 02/19/21   [provider]  vitamin B-12 1000 MCG tablet Take 1 tablet (1,000 mcg total) by mouth daily. 06/19/21   Dwyane Dee, MD      Allergies    Patient has no known allergies.    Review of Systems   Review of Systems  Respiratory:  Positive for shortness of breath.   Cardiovascular:  Positive for chest pain.    Physical Exam Updated Vital Signs BP 111/90   Pulse (!) 116   Temp (!) 101.7 F (38.7 C) (Oral)   Resp (!) 22   Ht '5\' 3"'$  (1.6 m)   Wt 72.6 kg   SpO2 97%   BMI 28.35 kg/m  Physical Exam Vitals and nursing note reviewed. Exam conducted with a chaperone present.  Constitutional:  Appearance: Normal appearance. She is ill-appearing.  HENT:     Head: Normocephalic and atraumatic.  Eyes:     General: No scleral icterus.       Right eye: No discharge.        Left eye: No discharge.     Extraocular Movements: Extraocular movements intact.     Pupils: Pupils are equal, round, and reactive to light.  Cardiovascular:     Rate and Rhythm: Regular rhythm. Tachycardia present.     Pulses: Normal pulses.     Heart sounds: Normal heart sounds. No murmur heard.    No friction rub. No gallop.  Pulmonary:     Effort: Pulmonary effort is normal. Tachypnea present. No respiratory distress.     Breath sounds: Decreased breath sounds present.  Abdominal:      General: Abdomen is flat. Bowel sounds are normal. There is no distension.     Palpations: Abdomen is soft.     Tenderness: There is no abdominal tenderness.  Musculoskeletal:     Cervical back: Normal range of motion. No rigidity.  Skin:    General: Skin is warm and dry.     Coloration: Skin is not jaundiced.  Neurological:     Mental Status: She is alert. Mental status is at baseline. She is disoriented.     Coordination: Coordination normal.     ED Results / Procedures / Treatments   Labs (all labs ordered are listed, but only abnormal results are displayed) Labs Reviewed  CBC WITH DIFFERENTIAL/PLATELET - Abnormal; Notable for the following components:      Result Value   WBC 22.2 (*)    RBC 3.53 (*)    Hemoglobin 10.3 (*)    HCT 34.3 (*)    Neutro Abs 19.4 (*)    Monocytes Absolute 1.6 (*)    Abs Immature Granulocytes 0.15 (*)    All other components within normal limits  RESP PANEL BY RT-PCR (FLU A&B, COVID) ARPGX2  CULTURE, BLOOD (ROUTINE X 2)  CULTURE, BLOOD (ROUTINE X 2)  URINE CULTURE  PROTIME-INR  APTT  LACTIC ACID, PLASMA  LACTIC ACID, PLASMA  COMPREHENSIVE METABOLIC PANEL  URINALYSIS, ROUTINE W REFLEX MICROSCOPIC  TROPONIN I (HIGH SENSITIVITY)    EKG EKG Interpretation  Date/Time:  Wednesday June 29 2022 20:57:52 EDT Ventricular Rate:  111 PR Interval:  134 QRS Duration: 87 QT Interval:  345 QTC Calculation: 469 R Axis:   60 Text Interpretation: Sinus tachycardia Multiple ventricular premature complexes Probable left atrial enlargement Anteroseptal infarct, old No significant change since last tracing Confirmed by Aletta Edouard [id number]) on 06/29/2022 9:00:31 PM  Radiology DG Chest Port 1 View  Result Date: 06/29/2022 CLINICAL DATA:  Questionable sepsis - evaluate for abnormality EXAM: PORTABLE CHEST 1 VIEW COMPARISON:  Chest radiograph 08/15/2021.  CT 07/25/2018 FINDINGS: Chronic volume loss in the right hemithorax with scarring at the right lung  base. Mild cardiomegaly with stable mediastinal contours. Chronic bronchial thickening, emphysema on prior CT. Streaky atelectasis at the left lung base. No pneumothorax or pleural effusion. No acute osseous abnormalities. IMPRESSION: 1. Chronic volume loss in the right hemithorax with scarring at the right lung base. 2. Stable mild cardiomegaly. Streaky atelectasis at the left lung base. Electronically Signed   By: Keith Rake M.D.   On: 06/29/2022 21:18    Procedures .Critical Care  Performed by: Sherrill Raring, PA-C Authorized by: Sherrill Raring, PA-C   Critical care provider statement:    Critical care time (minutes):  30  Critical care start time:  06/29/2022 10:30 PM   Critical care end time:  06/29/2022 11:02 PM   Critical care was necessary to treat or prevent imminent or life-threatening deterioration of the following conditions:  Sepsis   Critical care was time spent personally by me on the following activities:  Development of treatment plan with patient or surrogate, discussions with consultants, evaluation of patient's response to treatment, examination of patient, ordering and review of laboratory studies, ordering and review of radiographic studies, ordering and performing treatments and interventions, pulse oximetry, re-evaluation of patient's condition and review of old charts   Care discussed with: admitting provider       Medications Ordered in ED Medications  lactated ringers infusion (0 mLs Intravenous Hold 06/29/22 2116)  lactated ringers bolus 1,000 mL (1,000 mLs Intravenous New Bag/Given 06/29/22 2110)    And  lactated ringers bolus 1,000 mL (1,000 mLs Intravenous New Bag/Given 06/29/22 2112)    And  lactated ringers bolus 250 mL (0 mLs Intravenous Hold 06/29/22 2116)  acetaminophen (TYLENOL) tablet 1,000 mg (1,000 mg Oral Given 06/29/22 2109)    ED Course/ Medical Decision Making/ A&P Clinical Course as of 06/29/22 2301  Wed Jun 29, 2022  2055 I contacted patient's sister  who is listed as the emergency contact on her chart.  She states patient was complaining about feeling more short of breath yesterday and that her chest hurt.  She denies that patient has been coughing, having fevers, having any abdominal pain or vomiting.  The patient lives at home with daughter. [HS]  1070 71 year old female on home oxygen here with increased shortness of breath chest discomfort had a fever on arrival here.  Code sepsis activated.  Getting labs and imaging fluids.  Anticipate will need admission. [MB]  2130 CBC with Differential(!) Patient markedly leukocytosis with left shift.  Mild anemia with hemoglobin of 10.3. [HS]  2151 Given unknown source, broad-spectrum antibiotics of cefepime and vancomycin ordered [HS]  2205 Troponin I (High Sensitivity)(!!) Troponin is 896, cardiology consult placed.   [HS]  2205 Comprehensive metabolic panel(!) Patient has a slight AKI, mild hyponatremia 131 and mild hypochloride at 85.  Bicarb slightly elevated at 33, slight hyperglycemia at 152 [HS]  2226 I have requested the secretary to repaged cardiology. [HS]  2231 Spoke with cardiology who will see the patient.  They advised aspirin loading and starting patient on heparin.  They will consult the patient but do not think the need emergency catheterization giving priority is managing the sepsis. [HS]  2235 No clear source for sepsis presentation.  Will order CT chest and abdomen for additional work-up for possible etiology.  I have requested nurse in and out cath in case there is an underlying UTI. [HS]    Clinical Course User Index [HS] Sherrill Raring, PA-C [MB] Hayden Rasmussen, MD                           Medical Decision Making Amount and/or Complexity of Data Reviewed Labs: ordered. Decision-making details documented in ED Course. Radiology: ordered. ECG/medicine tests: ordered.  Risk OTC drugs. Prescription drug management.   Patient presents due to ED due to altered mental  status and shortness of breath.  Differential is broad but includes sepsis, COPD exacerbation, pneumonia, UTI, ACS, gross electrolyte derangement.  Given patient is febrile, tachycardic and tachypneic on exam I am concerned about sepsis.  Code sepsis was initiated, fluids ordered.  Tylenol ordered.  Patient does not appear volume overloaded on my exam, will proceed with 30 cc/kg. ABX ordered.   EMS was an independent story and as well as patient sister who I contacted by phone.  Given 125 Solu-Medrol, 2 DuoNebs and magnesium in route.   I ordered, viewed and interpreted laboratory work-up as documented above in the ED course.  I consulted specialist as documented above in the ED course.  Impression - Sepsis: Unknown source, antibiotics and fluid bolus initiated.  CT chest abdomen and pelvis ordered to evaluate given unknown source. - Elevated troponin: Elevated troponin, I consulted cardiology who advises aspirin and heparin.  They will follow the patient but do not think emergent catheterization is indicated at this time especially given septic.  They have evaluated the patient in the room.  Disposition - CT scan is pending, patient will need admission for sepsis pending no surgical emergencies. Will also need additional cardiac workup given elevated troponin.        Final Clinical Impression(s) / ED Diagnoses Final diagnoses:  None    Rx / DC Orders ED Discharge Orders     None         Sherrill Raring, [name] 06/30/22 2114    Hayden Rasmussen, MD 07/01/22 [phone]

## 2022-06-29 NOTE — Progress Notes (Signed)
ANTICOAGULATION CONSULT NOTE - Initial Consult  Pharmacy Consult for heparin Indication: chest pain/ACS  No Known Allergies  Patient Measurements: Height: '5\' 3"'$  (160 cm) Weight: 72.6 kg (160 lb 0.9 oz) IBW/kg (Calculated) : 52.4 Heparin Dosing Weight: 67.6kg  Vital Signs: Temp: 99.7 F (37.6 C) (07/05 2223) Temp Source: Oral (07/05 2223) BP: 101/47 (07/05 2215) Pulse Rate: 84 (07/05 2215)  Labs: Recent Labs    06/29/22 2057  HGB 10.3*  HCT 34.3*  PLT 160  APTT 29  LABPROT 14.9  INR 1.2  CREATININE 1.29*  TROPONINIHS 896*    Estimated Creatinine Clearance: 39.9 mL/min (A) (by C-G formula based on SCr of 1.29 mg/dL (H)).   Medical History: Past Medical History:  Diagnosis Date   Anxiety    Atrophic vaginitis 10/09/2015   Baker's cyst    left leg   Blood in urine 10/09/2015   CHF (congestive heart failure) (HCC)    COPD (chronic obstructive pulmonary disease) (HCC)    Heart murmur    Hematuria 10/09/2015   Hypertension    Nicotine addiction 04/07/2014   Pneumonia    Urinary urgency 01/25/2016   Vaginal bleeding 10/09/2015    Assessment: 32 YOF presenting with SOB and AMS, elevated troponin, she is not on anticoagulation PTA, chronic anemia stable  Goal of Therapy:  Heparin level 0.3-0.7 units/ml Monitor platelets by anticoagulation protocol: Yes   Plan:  Heparin 4000 units IV x 1, and gtt at 850 units/hr F/u 6 hour heparin level F/u cards eval and recs  Bertis Ruddy, PharmD Clinical Pharmacist ED Pharmacist Phone # [phone] 06/29/2022 10:36 PM

## 2022-06-29 NOTE — Sepsis Progress Note (Signed)
Elink following Code Sepsis. 

## 2022-06-29 NOTE — Consult Note (Signed)
Cardiology Consultation:   Patient ID: [name] MRN: [id number]; DOB: 10-31-1951  Admit date: 06/29/2022 Date of Consult: 06/29/2022  Primary Care Provider: Asencion Noble, MD Primary Cardiologist: None  Primary Electrophysiologist:  None    Patient Profile:   [name] is a 71 y.o. female with a hx of COPD, hypertension and no prior cardiac history who presents to the ER for shortness of breath. Cardiology is consulted for elevated troponin.  History of Present Illness:   [name] is a 71 y.o. female with a hx of COPD, hypertension and no prior cardiac history who presents to the ER for shortness of breath. Cardiology is consulted for elevated troponin.  This morning, patient's sister saw that she was confused and called EMS. EMS noted her to be febrile, altered ans BO. She was administered solumedrol 125, 2 duonebs and IV mg. On ER examination she was altered and reported having "pain everywhere". In the ER she was febrile to 101.7 and was hemodynamically stable. Admission labs were significant for Cr 1.2 (baseline 0.5 06/16/21), WBC 22 and trop of ~800. All other labs were WNL. Sepsis protocol was initiated and she was started on vanc/cefepime. CT chest/abdomen/pelvis is pending. EKG shows ST depressions in inferior leads and V3 - V6, which is stable from EKG from 2022. She has no prior ECHO.   On my examination, patient is still lethargic, but able to answer in short sentences. She denies chest pain at this time. She reports no prior cardiac history including cardiac surgeries, MI, or CHF. She reports she had intermittent, non-radiating chest pain starting 3 days ago that was not related to exertion. She could not tell me what made the chest pain worse, but reports drinking a gingerale made it better. Chest pain is somewhat reproducible to palpation. With regards to cardiac history, she has never seen a cardiologist\. She is a former smoker, but quit "a long time ago" and  also has a history of hypertension for which she takes losartan-HCTZ. She denies family history of heart attacks prior to the age of 43. She denies trauma, syncope, near syncope, orthopnea, PND, LE edema.  Past Medical History:  Diagnosis Date   Anxiety    Atrophic vaginitis 10/09/2015   Baker's cyst    left leg   Blood in urine 10/09/2015   CHF (congestive heart failure) (HCC)    COPD (chronic obstructive pulmonary disease) (HCC)    Heart murmur    Hematuria 10/09/2015   Hypertension    Nicotine addiction 04/07/2014   Pneumonia    Urinary urgency 01/25/2016   Vaginal bleeding 10/09/2015    Past Surgical History:  Procedure Laterality Date   ABDOMINAL HYSTERECTOMY     CATARACT EXTRACTION Left 03/2015   CESAREAN SECTION     COLONOSCOPY  10/19/2004   Dr. Gala Romney- internal hemorrhoids, o/w normal rectum, normal colon   COLONOSCOPY N/A 07/30/2014   FVC:BSWHQPR diverticulosis. Colonic polyp-removed TA. repeat TCS 07/2021   COLONOSCOPY WITH PROPOFOL N/A 03/29/2021   diverticulosis in sigmoid and descending colon, one 5 mm polyp ascending colon (adenoma), non-bleeding internal hemorrhoids. 5 year surveillance   ESOPHAGOGASTRODUODENOSCOPY N/A 07/30/2014   FFM:[id number] EGD   POLYPECTOMY  03/29/2021   Procedure: POLYPECTOMY;  Surgeon: Daneil Dolin, MD;  [facility];  Service: Endoscopy;;   RADIAL HEAD ARTHROPLASTY Left 06/23/2017   Procedure: RADIAL HEAD ARTHROPLASTY WITH LIGAMENT REPAIR;  Surgeon: Meredith Pel, MD;  Location: [facility];  Service: Orthopedics;  Laterality: Left;  Home Medications:  Prior to Admission medications   Medication Sig Start Date End Date Taking? Authorizing Provider  albuterol (PROVENTIL HFA;VENTOLIN HFA) 108 (90 BASE) MCG/ACT inhaler Inhale 2 puffs into the lungs every 6 (six) hours as needed for wheezing or shortness of breath. For shortness of breath    [provider]  aspirin EC 81 MG tablet Take 81 mg by mouth in the morning. Swallow  whole.    [provider]  atorvastatin (LIPITOR) 40 MG tablet Take 40 mg by mouth at bedtime. 09/28/20   [provider]  CARTIA XT 180 MG 24 hr capsule Take 180 mg by mouth 2 (two) times daily.  09/20/18   [provider]  HYDROcodone-acetaminophen (NORCO) 10-325 MG tablet Take 1 tablet by mouth 4 (four) times daily as needed. 03/02/20   [provider]  losartan-hydrochlorothiazide (HYZAAR) 100-25 MG tablet Take 1 tablet by mouth in the morning. 12/27/19   [provider]  mirtazapine (REMERON) 7.5 MG tablet Take 7.5 mg by mouth at bedtime. 02/25/20   [provider]  Multiple Vitamin (MULTIVITAMIN WITH MINERALS) TABS tablet Take 1 tablet by mouth in the morning.    [provider]  polyethylene glycol (MIRALAX / GLYCOLAX) 17 g packet Take 17 g by mouth daily as needed. Sometimes forgets to take everyday.    [provider]  tamsulosin (FLOMAX) 0.4 MG CAPS capsule TAKE 1 CAPSULE(0.4 MG) BY MOUTH TWICE DAILY 12/09/21   Summerlin, Julienne Portola, PA-C  TRELEGY ELLIPTA 100-62.5-25 MCG/INH AEPB Take 1 puff by mouth in the morning. 02/19/21   [provider]  vitamin B-12 1000 MCG tablet Take 1 tablet (1,000 mcg total) by mouth daily. 06/19/21   Dwyane Dee, MD    Inpatient Medications: Scheduled Meds:  [START ON 06/30/2022] aspirin  325 mg Oral Daily   heparin  4,000 Units Intravenous Once   Continuous Infusions:  heparin     lactated ringers Stopped (06/29/22 2116)   lactated ringers Stopped (06/29/22 2116)   vancomycin 1,000 mg (06/29/22 2225)   PRN Meds:   Allergies:   No Known Allergies  Social History:   Social History   Socioeconomic History   Marital status: Single    Spouse name: Not on file   Number of children: Not on file   Years of education: Not on file   Highest education level: Not on file  Occupational History   Occupation: retired  Tobacco Use   Smoking status: Former    Years: 40.00     Types: Cigarettes    Quit date: 04/14/2013    Years since quitting: 9.2   Smokeless tobacco: Never  Substance and Sexual Activity   Alcohol use: Yes    Comment: glass of wine maybe once a year   Drug use: No   Sexual activity: Never    Birth control/protection: Surgical    Comment: hyst  Other Topics Concern   Not on file  Social History Narrative   Not on file   Social Determinants of Health   Financial Resource Strain: Not on file  Food Insecurity: Not on file  Transportation Needs: Not on file  Physical Activity: Not on file  Stress: Not on file  Social Connections: Not on file  Intimate Partner Violence: Not on file    Family History:   No history of MI in first degree family prior to the age of 53 No history of familial cardiomyopathy Family History  Adopted: Yes  Problem Relation Age  of Onset   Cancer Sister        pancreatic   Migraines Daughter    Cancer Daughter        pre cancerous cells on cervix   Cancer Sister        breast cancer, colon   Blindness Maternal Grandfather    Other Brother        murdered   Other Sister        ruptured colon   Cancer Sister        breast, skin   Other Brother        was in [place] Sister        pancreatic   Hypertension Sister    Other Sister        ruptured colon   Colon cancer Neg Hx      Review of Systems: [y] = yes, '[ ]'$  = no   General: Weight gain '[ ]'$ ; Weight loss '[ ]'$ ; Anorexia '[ ]'$ ; Fatigue '[ ]'$ ; Fever [ X]; Chills '[ ]'$ ; Weakness '[ ]'$   Cardiac: Chest pain/pressure [ X]; Resting SOB '[ ]'$ ; Exertional SOB '[ ]'$ ; Orthopnea '[ ]'$ ; Pedal Edema '[ ]'$ ; Palpitations '[ ]'$ ; Syncope '[ ]'$ ; Presyncope '[ ]'$ ; Paroxysmal nocturnal dyspnea'[ ]'$   Pulmonary: Cough '[ ]'$ ; Wheezing'[ ]'$ ; Hemoptysis'[ ]'$ ; Sputum '[ ]'$ ; Snoring '[ ]'$   GI: Vomiting'[ ]'$ ; Dysphagia'[ ]'$ ; Melena'[ ]'$ ; Hematochezia '[ ]'$ ; Heartburn'[ ]'$ ; Abdominal pain '[ ]'$ ; Constipation '[ ]'$ ; Diarrhea '[ ]'$ ; BRBPR '[ ]'$   GU: Hematuria'[ ]'$ ; Dysuria '[ ]'$ ; Nocturia'[ ]'$   Vascular: Pain in legs with walking [  ]; Pain in feet with lying flat '[ ]'$ ; Non-healing sores '[ ]'$ ; Stroke '[ ]'$ ; TIA '[ ]'$ ; Slurred speech '[ ]'$ ;  Neuro: Headaches'[ ]'$ ; Vertigo'[ ]'$ ; Seizures'[ ]'$ ; Paresthesias'[ ]'$ ;Blurred vision '[ ]'$ ; Diplopia '[ ]'$ ; Vision changes '[ ]'$   Ortho/Skin: Arthritis '[ ]'$ ; Joint pain '[ ]'$ ; Muscle pain '[ ]'$ ; Joint swelling '[ ]'$ ; Back Pain '[ ]'$ ; Rash '[ ]'$   Psych: Depression'[ ]'$ ; Anxiety'[ ]'$   Heme: Bleeding problems '[ ]'$ ; Clotting disorders '[ ]'$ ; Anemia '[ ]'$   Endocrine: Diabetes '[ ]'$ ; Thyroid dysfunction'[ ]'$   Physical Exam/Data:   Vitals:   06/29/22 2145 06/29/22 2200 06/29/22 2215 06/29/22 2223  BP: (!) 101/42 (!) 96/44 (!) 101/47   Pulse: 96 91 84   Resp: (!) '21 14 17   '$ Temp:    99.7 F (37.6 C)  TempSrc:    Oral  SpO2: 95% 95% 95%   Weight:      Height:       No intake or output data in the 24 hours ending 06/29/22 2306 Filed Weights   06/29/22 2045  Weight: 72.6 kg   Body mass index is 28.35 kg/m.  General:  lethargic, well-nourished, caucasian F HEENT: normal Lymph: no adenopathy Neck: no JVD Endocrine:  No thryomegaly Vascular: No carotid bruits; FA pulses 2+ bilaterally without bruits  Cardiac:  normal S1, S2; tachycardic, no m/r/g Lungs:  some conversational dyspnea, anterior lung fields clear to auscultation Abd: soft, nontender, no hepatomegaly  Ext: no edema Musculoskeletal:  No deformities, BUE and BLE strength normal and equal Skin: warm and dry  Neuro:  CNs 2-12 intact, no focal abnormalities noted Psych:  Normal affect   EKG:  The EKG was personally reviewed and demonstrates ST depressions in II, III, aVF and V3 - V5 that are stable from 2022  Relevant CV Studies: No prior ECHO No prior cath  Laboratory Data:  Chemistry Recent Labs  Lab 06/29/22 2057  NA 131*  K 3.4*  CL 85*  CO2 33*  GLUCOSE 152*  BUN 26*  CREATININE 1.29*  CALCIUM 8.2*  GFRNONAA 45*  ANIONGAP 13    Recent Labs  Lab 06/29/22 2057  PROT 6.4*  ALBUMIN 3.2*  AST 24  ALT 16  ALKPHOS 65  BILITOT 0.4    Hematology Recent Labs  Lab 06/29/22 2057  WBC 22.2*  RBC 3.53*  HGB 10.3*  HCT 34.3*  MCV 97.2  MCH 29.2  MCHC 30.0  RDW 12.8  PLT 160   Trop 896  Radiology/Studies:  DG Chest Port 1 View  Result Date: 06/29/2022 CLINICAL DATA:  Questionable sepsis - evaluate for abnormality EXAM: PORTABLE CHEST 1 VIEW COMPARISON:  Chest radiograph 08/15/2021.  CT 07/25/2018 FINDINGS: Chronic volume loss in the right hemithorax with scarring at the right lung base. Mild cardiomegaly with stable mediastinal contours. Chronic bronchial thickening, emphysema on prior CT. Streaky atelectasis at the left lung base. No pneumothorax or pleural effusion. No acute osseous abnormalities. IMPRESSION: 1. Chronic volume loss in the right hemithorax with scarring at the right lung base. 2. Stable mild cardiomegaly. Streaky atelectasis at the left lung base. Electronically Signed   By: Keith Rake M.D.   On: 06/29/2022 21:18    Assessment and Plan:  [name] is a 71 y.o. female with a hx of COPD, hypertension and no prior cardiac history who presents to the ER for shortness of breath. Cardiology is consulted for elevated troponin in the setting of sepsis.  On admission, patient is noted to be febrile with WBC 22 (neutrophil predominant) and tachycardia most suggestive of sepsis from an infectious source. Agree with initiation of antibiotics and admission to hospital medicine. With regards to her elevated troponin, it is most likely secondary to demand ischemia in the setting of sepsis. However, given her cardiac risk factors (hypertension, age, history of smoking), no prior cath or ECHO, troponin >800 and EKG with diffuse ST depressions (though stable from 2022), it is reasonable to treat for NSTEMI with ASA load and IV heparin. She does not have a prior history of GIB, so the benefits of anticoagulation with heparin outweigh the risk. She does not have chest pain at this moment and does not have ST  elevations on EKG, so there is no indication for emergent catheterization. After her sepsis is treated and her AKI has resolved, it would be reasonable to obtain an ischemic evaluation prior to discharge. Would also recommend an ECHO to assess for wall motion abnormalities  #Elevated troponin secondary to demand ischemia vs NSTEMI - ASA load - No P2Y12 load - Continue to trend troponins - Start IV heparin  - Agree with treatment of sepsis - Obtain routine echocardiogram to assess for wall motion abnormalities - Consider ischemic evaluation prior to discharge after infection has been treated and AKI resolves - Cardiology will continue to follow  For questions or updates, please contact [facility] Please consult [email or web address] for contact info under   Signed, Loel Dubonnet, MD  06/29/2022 11:06 PM

## 2022-06-29 NOTE — ED Notes (Signed)
Critical troponin reported by lab  Troponin 896. Hildred Alamin pa-c notified

## 2022-06-30 ENCOUNTER — Inpatient Hospital Stay (HOSPITAL_COMMUNITY): Payer: Medicare Other

## 2022-06-30 ENCOUNTER — Emergency Department (HOSPITAL_COMMUNITY): Payer: Medicare Other

## 2022-06-30 DIAGNOSIS — R778 Other specified abnormalities of plasma proteins: Secondary | ICD-10-CM

## 2022-06-30 DIAGNOSIS — J9811 Atelectasis: Secondary | ICD-10-CM | POA: Diagnosis present

## 2022-06-30 DIAGNOSIS — I110 Hypertensive heart disease with heart failure: Secondary | ICD-10-CM | POA: Diagnosis present

## 2022-06-30 DIAGNOSIS — E785 Hyperlipidemia, unspecified: Secondary | ICD-10-CM | POA: Diagnosis present

## 2022-06-30 DIAGNOSIS — N10 Acute tubulo-interstitial nephritis: Secondary | ICD-10-CM | POA: Diagnosis present

## 2022-06-30 DIAGNOSIS — R6520 Severe sepsis without septic shock: Secondary | ICD-10-CM | POA: Diagnosis present

## 2022-06-30 DIAGNOSIS — R7881 Bacteremia: Secondary | ICD-10-CM | POA: Diagnosis present

## 2022-06-30 DIAGNOSIS — D649 Anemia, unspecified: Secondary | ICD-10-CM | POA: Diagnosis present

## 2022-06-30 DIAGNOSIS — R0602 Shortness of breath: Secondary | ICD-10-CM | POA: Diagnosis present

## 2022-06-30 DIAGNOSIS — N179 Acute kidney failure, unspecified: Secondary | ICD-10-CM | POA: Diagnosis present

## 2022-06-30 DIAGNOSIS — J690 Pneumonitis due to inhalation of food and vomit: Secondary | ICD-10-CM | POA: Diagnosis present

## 2022-06-30 DIAGNOSIS — J9621 Acute and chronic respiratory failure with hypoxia: Secondary | ICD-10-CM | POA: Diagnosis present

## 2022-06-30 DIAGNOSIS — N390 Urinary tract infection, site not specified: Secondary | ICD-10-CM | POA: Diagnosis present

## 2022-06-30 DIAGNOSIS — A4151 Sepsis due to Escherichia coli [E. coli]: Secondary | ICD-10-CM | POA: Diagnosis present

## 2022-06-30 DIAGNOSIS — R7989 Other specified abnormal findings of blood chemistry: Secondary | ICD-10-CM | POA: Diagnosis present

## 2022-06-30 DIAGNOSIS — R079 Chest pain, unspecified: Secondary | ICD-10-CM | POA: Diagnosis not present

## 2022-06-30 DIAGNOSIS — Z20822 Contact with and (suspected) exposure to covid-19: Secondary | ICD-10-CM | POA: Diagnosis present

## 2022-06-30 DIAGNOSIS — I10 Essential (primary) hypertension: Secondary | ICD-10-CM

## 2022-06-30 DIAGNOSIS — A419 Sepsis, unspecified organism: Secondary | ICD-10-CM | POA: Diagnosis present

## 2022-06-30 DIAGNOSIS — I5033 Acute on chronic diastolic (congestive) heart failure: Secondary | ICD-10-CM | POA: Diagnosis present

## 2022-06-30 DIAGNOSIS — R739 Hyperglycemia, unspecified: Secondary | ICD-10-CM | POA: Diagnosis present

## 2022-06-30 DIAGNOSIS — I700 Atherosclerosis of aorta: Secondary | ICD-10-CM | POA: Diagnosis present

## 2022-06-30 DIAGNOSIS — G9341 Metabolic encephalopathy: Secondary | ICD-10-CM

## 2022-06-30 DIAGNOSIS — Z9981 Dependence on supplemental oxygen: Secondary | ICD-10-CM | POA: Diagnosis not present

## 2022-06-30 DIAGNOSIS — E860 Dehydration: Secondary | ICD-10-CM | POA: Diagnosis present

## 2022-06-30 DIAGNOSIS — I248 Other forms of acute ischemic heart disease: Secondary | ICD-10-CM | POA: Diagnosis present

## 2022-06-30 DIAGNOSIS — E876 Hypokalemia: Secondary | ICD-10-CM | POA: Diagnosis present

## 2022-06-30 DIAGNOSIS — E669 Obesity, unspecified: Secondary | ICD-10-CM | POA: Diagnosis present

## 2022-06-30 DIAGNOSIS — E871 Hypo-osmolality and hyponatremia: Secondary | ICD-10-CM | POA: Diagnosis present

## 2022-06-30 DIAGNOSIS — J9622 Acute and chronic respiratory failure with hypercapnia: Secondary | ICD-10-CM | POA: Diagnosis present

## 2022-06-30 DIAGNOSIS — J432 Centrilobular emphysema: Secondary | ICD-10-CM | POA: Diagnosis present

## 2022-06-30 DIAGNOSIS — G894 Chronic pain syndrome: Secondary | ICD-10-CM | POA: Diagnosis present

## 2022-06-30 LAB — BLOOD CULTURE ID PANEL (REFLEXED) - BCID2

## 2022-06-30 LAB — COMPREHENSIVE METABOLIC PANEL
ALT: 15 U/L (ref 0–44)
AST: 21 U/L (ref 15–41)
Albumin: 2.6 g/dL — ABNORMAL LOW (ref 3.5–5.0)
Alkaline Phosphatase: 54 U/L (ref 38–126)
Anion gap: 10 (ref 5–15)
BUN: 25 mg/dL — ABNORMAL HIGH (ref 8–23)
CO2: 32 mmol/L (ref 22–32)
Calcium: 7.8 mg/dL — ABNORMAL LOW (ref 8.9–10.3)
Chloride: 91 mmol/L — ABNORMAL LOW (ref 98–111)
Creatinine, Ser: 1.12 mg/dL — ABNORMAL HIGH (ref 0.44–1.00)
GFR, Estimated: 54 mL/min — ABNORMAL LOW (ref >60)
Glucose, Bld: 212 mg/dL — ABNORMAL HIGH (ref 70–99)
Potassium: 3.6 mmol/L (ref 3.5–5.1)
Sodium: 133 mmol/L — ABNORMAL LOW (ref 135–145)
Total Bilirubin: 0.4 mg/dL (ref 0.3–1.2)
Total Protein: 5.8 g/dL — ABNORMAL LOW (ref 6.5–8.1)

## 2022-06-30 LAB — I-STAT ARTERIAL BLOOD GAS, ED
Acid-Base Excess: 8 mmol/L — ABNORMAL HIGH (ref 0.0–2.0)
Bicarbonate: 36.9 mmol/L — ABNORMAL HIGH (ref 20.0–28.0)
Calcium, Ion: 1.14 mmol/L — ABNORMAL LOW (ref 1.15–1.40)
HCT: 30 % — ABNORMAL LOW (ref 36.0–46.0)
Hemoglobin: 10.2 g/dL — ABNORMAL LOW (ref 12.0–15.0)
O2 Saturation: 88 %
Patient temperature: 99.7
Potassium: 3.2 mmol/L — ABNORMAL LOW (ref 3.5–5.1)
Sodium: 128 mmol/L — ABNORMAL LOW (ref 135–145)
TCO2: 39 mmol/L — ABNORMAL HIGH (ref 22–32)
pCO2 arterial: 81.7 mmHg (ref 32–48)
pH, Arterial: 7.266 — ABNORMAL LOW (ref 7.35–7.45)
pO2, Arterial: 67 mmHg — ABNORMAL LOW (ref 83–108)

## 2022-06-30 LAB — URINALYSIS, ROUTINE W REFLEX MICROSCOPIC
Bilirubin Urine: NEGATIVE
Glucose, UA: NEGATIVE mg/dL
Ketones, ur: NEGATIVE mg/dL
Nitrite: NEGATIVE
Protein, ur: NEGATIVE mg/dL
Specific Gravity, Urine: 1.023 (ref 1.005–1.030)
pH: 6 (ref 5.0–8.0)

## 2022-06-30 LAB — LIPID PANEL
Cholesterol: 154 mg/dL (ref 0–200)
HDL: 50 mg/dL (ref >40)
LDL Cholesterol: 84 mg/dL (ref 0–99)
Total CHOL/HDL Ratio: 3.1 RATIO
Triglycerides: 98 mg/dL (ref <150)
VLDL: 20 mg/dL (ref 0–40)

## 2022-06-30 LAB — HIV ANTIBODY (ROUTINE TESTING W REFLEX): HIV Screen 4th Generation wRfx: NONREACTIVE

## 2022-06-30 LAB — CBC
HCT: 31.1 % — ABNORMAL LOW (ref 36.0–46.0)
Hemoglobin: 9.5 g/dL — ABNORMAL LOW (ref 12.0–15.0)
MCH: 29.5 pg (ref 26.0–34.0)
MCHC: 30.5 g/dL (ref 30.0–36.0)
MCV: 96.6 fL (ref 80.0–100.0)
Platelets: 146 10*3/uL — ABNORMAL LOW (ref 150–400)
RBC: 3.22 MIL/uL — ABNORMAL LOW (ref 3.87–5.11)
RDW: 12.9 % (ref 11.5–15.5)
WBC: 16.5 10*3/uL — ABNORMAL HIGH (ref 4.0–10.5)
nRBC: 0 % (ref 0.0–0.2)

## 2022-06-30 LAB — ECHOCARDIOGRAM COMPLETE
Area-P 1/2: 4.06 cm2
Calc EF: 60.5 %
Height: 63 in
S' Lateral: 3.1 cm
Single Plane A2C EF: 52.9 %
Single Plane A4C EF: 64.5 %
Weight: 2560.86 oz

## 2022-06-30 LAB — HEPARIN LEVEL (UNFRACTIONATED)
Heparin Unfractionated: <0.1 IU/mL — ABNORMAL LOW (ref 0.30–0.70)
Heparin Unfractionated: <0.1 IU/mL — ABNORMAL LOW (ref 0.30–0.70)

## 2022-06-30 LAB — TROPONIN I (HIGH SENSITIVITY): Troponin I (High Sensitivity): 1114 ng/L (ref <18)

## 2022-06-30 LAB — LACTIC ACID, PLASMA: Lactic Acid, Venous: 1.4 mmol/L (ref 0.5–1.9)

## 2022-06-30 LAB — MAGNESIUM: Magnesium: 2.3 mg/dL (ref 1.7–2.4)

## 2022-06-30 LAB — PHOSPHORUS: Phosphorus: 2.8 mg/dL (ref 2.5–4.6)

## 2022-06-30 MED ORDER — POTASSIUM CHLORIDE 10 MEQ/100ML IV SOLN
10.0000 meq | INTRAVENOUS | Status: AC
  Administered 2022-06-30 (×2): 10 meq via INTRAVENOUS
  Filled 2022-06-30 (×3): qty 100

## 2022-06-30 MED ORDER — VITAMIN B-12 1000 MCG PO TABS
1000.0000 ug | ORAL_TABLET | Freq: Every day | ORAL | Status: DC
  Administered 2022-06-30 – 2022-07-07 (×8): 1000 ug via ORAL
  Filled 2022-06-30 (×8): qty 1

## 2022-06-30 MED ORDER — PREDNISONE 20 MG PO TABS
40.0000 mg | ORAL_TABLET | Freq: Every day | ORAL | Status: DC
  Administered 2022-06-30 – 2022-07-03 (×4): 40 mg via ORAL
  Filled 2022-06-30 (×4): qty 2

## 2022-06-30 MED ORDER — HYDRALAZINE HCL 10 MG PO TABS: 10.0000 mg | ORAL_TABLET | Freq: Four times a day (QID) | ORAL | Status: DC | PRN

## 2022-06-30 MED ORDER — ONDANSETRON HCL 4 MG/2ML IJ SOLN: 4.0000 mg | Freq: Four times a day (QID) | INTRAMUSCULAR | Status: DC | PRN

## 2022-06-30 MED ORDER — KETOROLAC TROMETHAMINE 15 MG/ML IJ SOLN
15.0000 mg | Freq: Once | INTRAMUSCULAR | Status: AC
  Administered 2022-06-30: 15 mg via INTRAVENOUS
  Filled 2022-06-30: qty 1

## 2022-06-30 MED ORDER — FLUTICASONE FUROATE-VILANTEROL 100-25 MCG/ACT IN AEPB
1.0000 | INHALATION_SPRAY | Freq: Every day | RESPIRATORY_TRACT | Status: DC
  Administered 2022-07-01 – 2022-07-07 (×7): 1 via RESPIRATORY_TRACT
  Filled 2022-06-30 (×2): qty 28

## 2022-06-30 MED ORDER — ASPIRIN 300 MG RE SUPP: 300.0000 mg | Freq: Every day | RECTAL | Status: DC

## 2022-06-30 MED ORDER — ALBUTEROL SULFATE (2.5 MG/3ML) 0.083% IN NEBU
2.5000 mg | INHALATION_SOLUTION | RESPIRATORY_TRACT | Status: DC | PRN
  Administered 2022-07-02 – 2022-07-04 (×2): 2.5 mg via RESPIRATORY_TRACT
  Filled 2022-06-30 (×2): qty 3

## 2022-06-30 MED ORDER — ATORVASTATIN CALCIUM 80 MG PO TABS
80.0000 mg | ORAL_TABLET | Freq: Every day | ORAL | Status: DC
  Administered 2022-06-30 – 2022-07-06 (×7): 80 mg via ORAL
  Filled 2022-06-30 (×7): qty 1

## 2022-06-30 MED ORDER — TAMSULOSIN HCL 0.4 MG PO CAPS
0.4000 mg | ORAL_CAPSULE | Freq: Every day | ORAL | Status: DC
  Administered 2022-06-30 – 2022-07-06 (×7): 0.4 mg via ORAL
  Filled 2022-06-30 (×7): qty 1

## 2022-06-30 MED ORDER — LIDOCAINE 5 % EX PTCH
1.0000 | MEDICATED_PATCH | CUTANEOUS | Status: DC
  Administered 2022-06-30 – 2022-07-07 (×8): 1 via TRANSDERMAL
  Filled 2022-06-30 (×8): qty 1

## 2022-06-30 MED ORDER — SODIUM CHLORIDE 0.9 % IV BOLUS
1000.0000 mL | INTRAVENOUS | Status: AC
Start: 2022-06-30 — End: 2022-06-30
  Administered 2022-06-30: 1000 mL via INTRAVENOUS

## 2022-06-30 MED ORDER — IPRATROPIUM-ALBUTEROL 0.5-2.5 (3) MG/3ML IN SOLN
3.0000 mL | Freq: Four times a day (QID) | RESPIRATORY_TRACT | Status: DC
Start: 2022-06-30 — End: 2022-07-01
  Administered 2022-06-30 – 2022-07-01 (×5): 3 mL via RESPIRATORY_TRACT
  Filled 2022-06-30 (×5): qty 3

## 2022-06-30 MED ORDER — HEPARIN BOLUS VIA INFUSION
2000.0000 [IU] | Freq: Once | INTRAVENOUS | Status: AC
  Administered 2022-06-30: 2000 [IU] via INTRAVENOUS
  Filled 2022-06-30: qty 2000

## 2022-06-30 MED ORDER — UMECLIDINIUM BROMIDE 62.5 MCG/ACT IN AEPB
1.0000 | INHALATION_SPRAY | Freq: Every day | RESPIRATORY_TRACT | Status: DC
  Administered 2022-07-01 – 2022-07-07 (×7): 1 via RESPIRATORY_TRACT
  Filled 2022-06-30 (×2): qty 7

## 2022-06-30 MED ORDER — HYDROCODONE-ACETAMINOPHEN 10-325 MG PO TABS
1.0000 | ORAL_TABLET | Freq: Four times a day (QID) | ORAL | Status: DC | PRN
  Administered 2022-06-30 – 2022-07-07 (×26): 1 via ORAL
  Filled 2022-06-30 (×28): qty 1

## 2022-06-30 MED ORDER — HEPARIN BOLUS VIA INFUSION
2000.0000 [IU] | Freq: Once | INTRAVENOUS | Status: DC
  Filled 2022-06-30: qty 2000

## 2022-06-30 MED ORDER — IOHEXOL 300 MG/ML  SOLN
75.0000 mL | Freq: Once | INTRAMUSCULAR | Status: AC | PRN
  Administered 2022-06-30: 75 mL via INTRAVENOUS

## 2022-06-30 MED ORDER — ASPIRIN 81 MG PO CHEW
81.0000 mg | CHEWABLE_TABLET | Freq: Once | ORAL | Status: AC
  Administered 2022-06-30: 81 mg via ORAL
  Filled 2022-06-30: qty 1

## 2022-06-30 MED ORDER — SODIUM CHLORIDE 0.9 % IV SOLN
2.0000 g | INTRAVENOUS | Status: DC
  Administered 2022-06-30 – 2022-07-03 (×4): 2 g via INTRAVENOUS
  Filled 2022-06-30 (×5): qty 20

## 2022-06-30 NOTE — ED Notes (Signed)
Bedside Echo being done at this time.  Per pts request daughter Jake Samples updated on POC [phone]

## 2022-06-30 NOTE — Progress Notes (Signed)
  Echocardiogram 2D Echocardiogram has been performed.  [name] 06/30/2022, 11:55 AM

## 2022-06-30 NOTE — Progress Notes (Signed)
NP at bedside with patient. Per patient request, NP is in agreement, at this time, to hold off on BiPAP due to patient being more alert. BiPAP is on standby at bedside. Vitals stable. Will continue to monitor.

## 2022-06-30 NOTE — ED Notes (Signed)
Pt ambulated to the restroom on 2l of o2 when returned back to the room and placed back on the monitor pt SPO2 was 80 on 2L. With deep breathing pt when up to 90

## 2022-06-30 NOTE — Consult Note (Signed)
NAME:  [name], MRN:  [id number], DOB:  [birth year], LOS: 0 ADMISSION DATE:  06/29/2022, CONSULTATION DATE: 7/6 REFERRING MD: Dr. Nevada Crane, CHIEF COMPLAINT: Respiratory failure  History of Present Illness:  71 year old female with past medical history as below, which is significant for hypertension and COPD with chronic respiratory failure on 2 L..  She presented most emergency department from home via EMS with complaints of confusion and fever.  She was short of breath and wheezing in route to the emergency department was treated by EMS with Solu-Medrol, DuoNebs, and IV magnesium.  Upon arrival to the emergency department she was noted to have fever one 101.7 as well as tachypnea.  Laboratory evaluation significant for leukocytosis, anemia, and elevated troponin.  Cardiology was consulted felt EKG was consistent with previous study and suspect demand ischemia.  Heparin infusion initiated.  Primary concern was for sepsis and without clear source CT chest and abdomen was done.  CT concerning for pyelonephritis.  The patient was ultimately admitted to the hospitalist service with plans for antibiotics.  The patient became progressively more confused and lethargic prompting ABG, which demonstrated respiratory acidosis with pH 7.26.  The patient was started on BiPAP and PCCM was consulted for further evaluation.  Pertinent  Medical History   has a past medical history of Anxiety, Atrophic vaginitis (10/09/2015), Baker's cyst, Blood in urine (10/09/2015), CHF (congestive heart failure) ([place]), COPD (chronic obstructive pulmonary disease) ([place]), Heart murmur, Hematuria (10/09/2015), Hypertension, Nicotine addiction (04/07/2014), Pneumonia, Urinary urgency (01/25/2016), and Vaginal bleeding (10/09/2015).   Significant Hospital Events: Including procedures, antibiotic start and stop dates in addition to other pertinent events     Interim History / Subjective:    Objective   Blood pressure 107/60, pulse  74, temperature 99.7 F (37.6 C), temperature source Oral, resp. rate (!) 29, height '5\' 3"'$  (1.6 m), weight 72.6 kg, SpO2 (!) 88 %.       No intake or output data in the 24 hours ending 06/30/22 0321 Filed Weights   06/29/22 2045  Weight: 72.6 kg    Examination: General: elderly female in NAD HENT: [place]/AT, PERRL, no JVD Lungs: Clear bilateral breath sounds Cardiovascular: RRR, no MRG Abdomen: Soft, non-tender, non-distended. CVA tenderness L flank Extremities: No acute deformity, edema, or ROM limitation.  Neuro: Spontaneously awake, alert, oriented, and non-focal.   Resolved Hospital Problem list     Assessment & Plan:   Severe sepsis secondary to pyelonephritis on the left.  - Antibiotics per primary - BP improved after 2.5L IVF - Hold home antihypertensive  Acute on chronic hypercarbic respiratory failure Chronic hypoxemic respiratory failure: on 2L home O2.  COPD with acute exacerbation - PRN BiPAP. Could not tolerate it here tonight, but thankfully she is much more awake - Repeat ABG only if she becomes lethargic again  - Scheduled duonebs, prn albuterol - s/p solumedrol 125, start prednisone 40 tomorrow. Taper per primary - Holding home Trelegy in favor of nebs above  Elevated troponin - Cardiology following - EKG consistent with prior - Heparin per pharmacy  AKI Hypokalemia HTN - per primary   Best Practice (right click and "Reselect all SmartList Selections" daily)   Per [facility]  Labs   CBC: Recent Labs  Lab 06/29/22 2057 06/30/22 0251  WBC 22.2*  --   NEUTROABS 19.4*  --   HGB 10.3* 10.2*  HCT 34.3* 30.0*  MCV 97.2  --   PLT 160  --     Basic Metabolic Panel: Recent Labs  Lab 06/29/22 2057 06/30/22 0251  NA 131* 128*  K 3.4* 3.2*  CL 85*  --   CO2 33*  --   GLUCOSE 152*  --   BUN 26*  --   CREATININE 1.29*  --   CALCIUM 8.2*  --    GFR: Estimated Creatinine Clearance: 39.9 mL/min (A) (by C-G formula based on SCr of 1.29 mg/dL  (H)). Recent Labs  Lab 06/29/22 2057 06/29/22 2350  WBC 22.2*  --   LATICACIDVEN 1.4 1.4    Liver Function Tests: Recent Labs  Lab 06/29/22 2057  AST 24  ALT 16  ALKPHOS 65  BILITOT 0.4  PROT 6.4*  ALBUMIN 3.2*   No results for input(s): "LIPASE", "AMYLASE" in the last 168 hours. No results for input(s): "AMMONIA" in the last 168 hours.  ABG    Component Value Date/Time   PHART 7.266 (L) 06/30/2022 0251   PCO2ART 81.7 (HH) 06/30/2022 0251   PO2ART 67 (L) 06/30/2022 0251   HCO3 36.9 (H) 06/30/2022 0251   TCO2 39 (H) 06/30/2022 0251   O2SAT 88 06/30/2022 0251     Coagulation Profile: Recent Labs  Lab 06/29/22 2057  INR 1.2    Cardiac Enzymes: No results for input(s): "CKTOTAL", "CKMB", "CKMBINDEX", "TROPONINI" in the last 168 hours.  HbA1C: No results found for: "HGBA1C"  CBG: No results for input(s): "GLUCAP" in the last 168 hours.  Review of Systems:   Bolds are positive  Constitutional: weight loss, gain, night sweats, Fevers, chills, fatigue .  HEENT: headaches, Sore throat, sneezing, nasal congestion, post nasal drip, Difficulty swallowing, Tooth/dental problems, visual complaints visual changes, ear ache CV:  chest pain, radiates:,Orthopnea, PND, swelling in lower extremities, dizziness, palpitations, syncope.  GI  heartburn, indigestion, abdominal pain, nausea, vomiting, diarrhea, change in bowel habits, loss of appetite, bloody stools.  Resp: cough, productive: , hemoptysis, dyspnea, chest pain, pleuritic.  Skin: rash or itching or icterus GU: dysuria, change in color of urine, urgency or frequency. flank pain, hematuria  MS: joint pain or swelling. decreased range of motion  Psych: change in mood or affect. depression or anxiety.  Neuro: difficulty with speech, weakness, numbness, ataxia    Past Medical History:  She,  has a past medical history of Anxiety, Atrophic vaginitis (10/09/2015), Baker's cyst, Blood in urine (10/09/2015), CHF  (congestive heart failure) ([place]), COPD (chronic obstructive pulmonary disease) ([place]), Heart murmur, Hematuria (10/09/2015), Hypertension, Nicotine addiction (04/07/2014), Pneumonia, Urinary urgency (01/25/2016), and Vaginal bleeding (10/09/2015).   Surgical History:   Past Surgical History:  Procedure Laterality Date   ABDOMINAL HYSTERECTOMY     CATARACT EXTRACTION Left 03/2015   CESAREAN SECTION     COLONOSCOPY  10/19/2004   Dr. Gala Romney- internal hemorrhoids, o/w normal rectum, normal colon   COLONOSCOPY N/A 07/30/2014   PTW:SFKCLEX diverticulosis. Colonic polyp-removed TA. repeat TCS 07/2021   COLONOSCOPY WITH PROPOFOL N/A 03/29/2021   diverticulosis in sigmoid and descending colon, one 5 mm polyp ascending colon (adenoma), non-bleeding internal hemorrhoids. 5 year surveillance   ESOPHAGOGASTRODUODENOSCOPY N/A 07/30/2014   NTZ:GYFVCB EGD   POLYPECTOMY  03/29/2021   Procedure: POLYPECTOMY;  Surgeon: Daneil Dolin, MD;  [facility];  Service: Endoscopy;;   RADIAL HEAD ARTHROPLASTY Left 06/23/2017   Procedure: RADIAL HEAD ARTHROPLASTY WITH LIGAMENT REPAIR;  Surgeon: Meredith Pel, MD;  Location: [place];  Service: Orthopedics;  Laterality: Left;     Social History:   reports that she quit smoking about 9 years ago. Her smoking use included  cigarettes. She has never used smokeless tobacco. She reports current alcohol use. She reports that she does not use drugs.   Family History:  Her family history includes Blindness in her maternal grandfather; Cancer in her daughter, sister, sister, sister, and sister; Hypertension in her sister; Migraines in her daughter; Other in her brother, brother, sister, and sister. There is no history of Colon cancer. She was adopted.   Allergies No Known Allergies   Home Medications  Prior to Admission medications   Medication Sig Start Date End Date Taking? Authorizing Provider  albuterol (PROVENTIL HFA;VENTOLIN HFA) 108 (90 BASE) MCG/ACT inhaler  Inhale 2 puffs into the lungs every 6 (six) hours as needed for wheezing or shortness of breath. For shortness of breath    [provider]  aspirin EC 81 MG tablet Take 81 mg by mouth in the morning. Swallow whole.    [provider]  atorvastatin (LIPITOR) 40 MG tablet Take 40 mg by mouth at bedtime. 09/28/20   [provider]  CARTIA XT 180 MG 24 hr capsule Take 180 mg by mouth 2 (two) times daily.  09/20/18   [provider]  HYDROcodone-acetaminophen (NORCO) 10-325 MG tablet Take 1 tablet by mouth 4 (four) times daily as needed. 03/02/20   [provider]  losartan-hydrochlorothiazide (HYZAAR) 100-25 MG tablet Take 1 tablet by mouth in the morning. 12/27/19   [provider]  mirtazapine (REMERON) 7.5 MG tablet Take 7.5 mg by mouth at bedtime. 02/25/20   [provider]  Multiple Vitamin (MULTIVITAMIN WITH MINERALS) TABS tablet Take 1 tablet by mouth in the morning.    [provider]  polyethylene glycol (MIRALAX / GLYCOLAX) 17 g packet Take 17 g by mouth daily as needed. Sometimes forgets to take everyday.    [provider]  tamsulosin (FLOMAX) 0.4 MG CAPS capsule TAKE 1 CAPSULE(0.4 MG) BY MOUTH TWICE DAILY 12/09/21   Summerlin, Julienne Aguilita, PA-C  TRELEGY ELLIPTA 100-62.5-25 MCG/INH AEPB Take 1 puff by mouth in the morning. 02/19/21   [provider]  vitamin B-12 1000 MCG tablet Take 1 tablet (1,000 mcg total) by mouth daily. 06/19/21   Dwyane Dee, MD     Critical care time:      Georgann Housekeeper, AGACNP-BC [place] for personal pager PCCM on call pager [phone] until 7pm. Please call Elink 7p-7a. [phone]  06/30/2022 3:56 AM

## 2022-06-30 NOTE — H&P (Addendum)
History and Physical  MATRICE HERRO JOA:[id number] DOB: 27-Apr-1951 DOA: 06/29/2022  Referring physician: Sabino Snipes  PCP: Asencion Noble, MD  Outpatient Specialists: GI Patient coming from: Home  Chief Complaint: Confusion and fever.  HPI: LARYN VENNING is a 71 y.o. female with medical history significant for COPD, not on oxygen supplementation at baseline, essential hypertension, hyperlipidemia, chronic pain syndrome, who presented to [facility] ED from home due to progressive confusion and a fever noticed by a family member.  EMS was activated.  The patient was brought in to the ED for further evaluation and management.    Work-up in the ED revealed a fever with Tmax 101.7, tachypnea, leukocytosis.  Code sepsis was called in the ED secondary to left pyelonephritis seen on CT scan.  Additionally the patient's high-sensitivity troponin was elevated greater than 900, repeat was greater than 1000.  EDP discussed the case with cardiology, elevated troponin thought likely demand ischemia, but need to rule out NSTEMI.  The patient was started on heparin drip, per cardiologist recommendation.  The patient received 1 dose of IV vancomycin, 1 dose of cefepime, 2250 cc of IV fluid boluses, and was started on LR at 150 cc/h.  TRH, hospitalist service, was asked to admit.  At the time of this visit, the patient is lethargic but arousable to voices.  She is hypotensive with MAP of 59.  1 L IV fluid bolus normal saline ordered to be administered.  ED Course: Tmax 101.7.  BP 90/41.  Pulse 66, respiratory 10, saturation 94% on 2 L.  Lab studies significant for serum sodium 131, potassium 3.4, serum bicarb 33, glucose 152, BUN 26, creatinine 1.29, calcium 8.2.  WBC 22.  Hemoglobin 10.3, baseline 11.  MCV 97.  Neutrophil count 19.4.  Review of Systems: Review of systems as noted in the HPI. All other systems reviewed and are negative.   Past Medical History:  Diagnosis Date   Anxiety    Atrophic vaginitis  10/09/2015   Baker's cyst    left leg   Blood in urine 10/09/2015   CHF (congestive heart failure) (HCC)    COPD (chronic obstructive pulmonary disease) (HCC)    Heart murmur    Hematuria 10/09/2015   Hypertension    Nicotine addiction 04/07/2014   Pneumonia    Urinary urgency 01/25/2016   Vaginal bleeding 10/09/2015   Past Surgical History:  Procedure Laterality Date   ABDOMINAL HYSTERECTOMY     CATARACT EXTRACTION Left 03/2015   CESAREAN SECTION     COLONOSCOPY  10/19/2004   Dr. Gala Romney- internal hemorrhoids, o/w normal rectum, normal colon   COLONOSCOPY N/A 07/30/2014   SWF:UXNATFT diverticulosis. Colonic polyp-removed TA. repeat TCS 07/2021   COLONOSCOPY WITH PROPOFOL N/A 03/29/2021   diverticulosis in sigmoid and descending colon, one 5 mm polyp ascending colon (adenoma), non-bleeding internal hemorrhoids. 5 year surveillance   ESOPHAGOGASTRODUODENOSCOPY N/A 07/30/2014   DDU:KGURKY EGD   POLYPECTOMY  03/29/2021   Procedure: POLYPECTOMY;  Surgeon: Daneil Dolin, MD;  Location: [facility];  Service: Endoscopy;;   RADIAL HEAD ARTHROPLASTY Left 06/23/2017   Procedure: RADIAL HEAD ARTHROPLASTY WITH LIGAMENT REPAIR;  Surgeon: Meredith Pel, MD;  Location: [place];  Service: Orthopedics;  Laterality: Left;    Social History:  reports that she quit smoking about 9 years ago. Her smoking use included cigarettes. She has never used smokeless tobacco. She reports current alcohol use. She reports that she does not use drugs.   No Known Allergies  Family History  Adopted: Yes  Problem Relation Age of Onset   Cancer Sister        pancreatic   Migraines Daughter    Cancer Daughter        pre cancerous cells on cervix   Cancer Sister        breast cancer, colon   Blindness Maternal Grandfather    Other Brother        murdered   Other Sister        ruptured colon   Cancer Sister        breast, skin   Other Brother        was in MVA   Cancer Sister        pancreatic    Hypertension Sister    Other Sister        ruptured colon   Colon cancer Neg Hx       Prior to Admission medications   Medication Sig Start Date End Date Taking? Authorizing Provider  albuterol (PROVENTIL HFA;VENTOLIN HFA) 108 (90 BASE) MCG/ACT inhaler Inhale 2 puffs into the lungs every 6 (six) hours as needed for wheezing or shortness of breath. For shortness of breath    [provider]  aspirin EC 81 MG tablet Take 81 mg by mouth in the morning. Swallow whole.    [provider]  atorvastatin (LIPITOR) 40 MG tablet Take 40 mg by mouth at bedtime. 09/28/20   [provider]  CARTIA XT 180 MG 24 hr capsule Take 180 mg by mouth 2 (two) times daily.  09/20/18   [provider]  HYDROcodone-acetaminophen (NORCO) 10-325 MG tablet Take 1 tablet by mouth 4 (four) times daily as needed. 03/02/20   [provider]  losartan-hydrochlorothiazide (HYZAAR) 100-25 MG tablet Take 1 tablet by mouth in the morning. 12/27/19   [provider]  mirtazapine (REMERON) 7.5 MG tablet Take 7.5 mg by mouth at bedtime. 02/25/20   [provider]  Multiple Vitamin (MULTIVITAMIN WITH MINERALS) TABS tablet Take 1 tablet by mouth in the morning.    [provider]  polyethylene glycol (MIRALAX / GLYCOLAX) 17 g packet Take 17 g by mouth daily as needed. Sometimes forgets to take everyday.    [provider]  tamsulosin (FLOMAX) 0.4 MG CAPS capsule TAKE 1 CAPSULE(0.4 MG) BY MOUTH TWICE DAILY 12/09/21   [place], Julienne [place], PA-C  TRELEGY ELLIPTA 100-62.5-25 MCG/INH AEPB Take 1 puff by mouth in the morning. 02/19/21   [provider]  vitamin B-12 1000 MCG tablet Take 1 tablet (1,000 mcg total) by mouth daily. 06/19/21   Dwyane Dee, MD    Physical Exam: BP (!) 97/46   Pulse 66   Temp 99.7 F (37.6 C) (Oral)   Resp 11   Ht '5\' 3"'$  (1.6 m)   Wt 72.6 kg   SpO2 93%   BMI 28.35 kg/m   General: 71 y.o. year-old female well  developed well nourished in no acute distress.  Lethargic. Cardiovascular: Regular rate and rhythm with no rubs or gallops.  No thyromegaly or JVD noted.  No lower extremity edema. 2/4 pulses in all 4 extremities. Respiratory: Mild rales at bases.  No wheezing noted.  Poor inspiratory effort. Abdomen: Soft nontender nondistended with normal bowel sounds x4 quadrants. Muskuloskeletal: No cyanosis, clubbing or edema noted bilaterally Neuro: CN II-XII intact, strength, sensation, reflexes Skin: No ulcerative lesions noted or rashes Psychiatry: Unable to assess judgment or mood due to lethargy.  Labs on Admission:  Basic Metabolic Panel: Recent Labs  Lab 06/29/22 2057  NA 131*  K 3.4*  CL 85*  CO2 33*  GLUCOSE 152*  BUN 26*  CREATININE 1.29*  CALCIUM 8.2*   Liver Function Tests: Recent Labs  Lab 06/29/22 2057  AST 24  ALT 16  ALKPHOS 65  BILITOT 0.4  PROT 6.4*  ALBUMIN 3.2*   No results for input(s): "LIPASE", "AMYLASE" in the last 168 hours. No results for input(s): "AMMONIA" in the last 168 hours. CBC: Recent Labs  Lab 06/29/22 2057  WBC 22.2*  NEUTROABS 19.4*  HGB 10.3*  HCT 34.3*  MCV 97.2  PLT 160   Cardiac Enzymes: No results for input(s): "CKTOTAL", "CKMB", "CKMBINDEX", "TROPONINI" in the last 168 hours.  BNP (last 3 results) No results for input(s): "BNP" in the last 8760 hours.  ProBNP (last 3 results) No results for input(s): "PROBNP" in the last 8760 hours.  CBG: No results for input(s): "GLUCAP" in the last 168 hours.  Radiological Exams on Admission: CT CHEST ABDOMEN PELVIS W CONTRAST  Result Date: 06/30/2022 CLINICAL DATA:  Possible sepsis EXAM: CT CHEST, ABDOMEN, AND PELVIS WITH CONTRAST TECHNIQUE: Multidetector CT imaging of the chest, abdomen and pelvis was performed following the standard protocol during bolus administration of intravenous contrast. RADIATION DOSE REDUCTION: This exam was performed according to the departmental  dose-optimization program which includes automated exposure control, adjustment of the mA and/or kV according to patient size and/or use of iterative reconstruction technique. CONTRAST:  36m OMNIPAQUE IOHEXOL 300 MG/ML  SOLN COMPARISON:  Chest x-ray from earlier in the same day, CT from 07/25/2018, 06/25/2020, 03/20/2013 FINDINGS: CT CHEST FINDINGS Cardiovascular: Atherosclerotic calcifications of the aorta are noted without aneurysmal dilatation. The heart is at the upper limits of normal in size. Pulmonary artery shows no central pulmonary embolus although not timed for embolus evaluation. Mediastinum/Nodes: Thoracic inlet is within normal limits. No hilar or mediastinal adenopathy is noted. The esophagus is within normal limits. Lungs/Pleura: Left lung is hyperinflated with emphysematous change. Mild left basilar atelectatic changes are seen. 7 mm nodule is noted in the left lower lobe best seen on image number 86 of series 5. Chronic scarring is noted in the right lung base and slightly progressed when compared with the prior CT from 2019. Calcified pleural plaques are noted. No acute infiltrate or sizable effusion is noted. Emphysematous changes are noted within the right lung as well. Musculoskeletal: Degenerative changes of the thoracic spine are seen. No acute rib abnormality is noted. CT ABDOMEN PELVIS FINDINGS Hepatobiliary: No focal liver abnormality is seen. No gallstones, gallbladder wall thickening, or biliary dilatation. Pancreas: Unremarkable. No pancreatic ductal dilatation or surrounding inflammatory changes. Spleen: Normal in size without focal abnormality. Adrenals/Urinary Tract: Adrenal glands are well visualized with a stable 16 mm nodule in the left adrenal similar to that seen on prior exam (2014) consistent with adenoma. Kidneys are well visualized bilaterally. The left kidney is mildly enlarged with some patchy decreased enhancement identified likely related to pyelonephritis. The right  kidney shows a normal enhancement pattern. No obstructive changes are seen. The bladder is within normal limits. Stomach/Bowel: Scattered diverticular change of the colon is noted. No diverticulitis is seen. The appendix is within normal limits. Small bowel and stomach are unremarkable. Vascular/Lymphatic: Aortic atherosclerosis. No enlarged abdominal or pelvic lymph nodes. Reproductive: Status post hysterectomy. No adnexal masses. Other: No abdominal wall hernia or abnormality. No abdominopelvic ascites. Musculoskeletal: No acute bony abnormality is noted. IMPRESSION: CT of  the chest: Chronic changes in the right lung with progressive scarring in the right lower lobe as well as calcified pleural plaques. 7 mm left lower lobe nodule. Recommend a non-contrast Chest CT at 6-12 months, then another non-contrast Chest CT at 18-24 months. These guidelines do not apply to immunocompromised patients and patients with cancer. Follow up in patients with significant comorbidities as clinically warranted. For lung cancer screening, adhere to Lung-RADS guidelines. Reference: Radiology. 2017; 284(1):228-43. CT of the abdomen and pelvis: Patchy enhancement in the left kidney consistent with pyelonephritis. Minimal fullness of the proximal ureter is seen although no obstructing lesion is noted. Diverticulosis without diverticulitis. Stable left adrenal lesion dating back to 2014 consistent with adenoma. Aortic Atherosclerosis (ICD10-I70.0) and Emphysema (ICD10-J43.9). Electronically Signed   By: Inez Catalina M.D.   On: 06/30/2022 01:25   DG Chest Port 1 View  Result Date: 06/29/2022 CLINICAL DATA:  Questionable sepsis - evaluate for abnormality EXAM: PORTABLE CHEST 1 VIEW COMPARISON:  Chest radiograph 08/15/2021.  CT 07/25/2018 FINDINGS: Chronic volume loss in the right hemithorax with scarring at the right lung base. Mild cardiomegaly with stable mediastinal contours. Chronic bronchial thickening, emphysema on prior CT.  Streaky atelectasis at the left lung base. No pneumothorax or pleural effusion. No acute osseous abnormalities. IMPRESSION: 1. Chronic volume loss in the right hemithorax with scarring at the right lung base. 2. Stable mild cardiomegaly. Streaky atelectasis at the left lung base. Electronically Signed   By: Keith Rake M.D.   On: 06/29/2022 21:18    EKG: I independently viewed the EKG done and my findings are as followed: Sinus rhythm rate of 79.  Nonspecific ST-T changes.  QTc 461.  Assessment/Plan Present on Admission:  Sepsis ([place])  Principal Problem:   Sepsis ([place])  Severe sepsis secondary to left pyelonephritis seen on CT scan, POA Fever Tmax 101.7, respiration rate 24, WBC 22.2 K Left pyelonephritis seen on CT scan. Follow-up blood cultures and urine cultures Maintain MAP greater than 65 After adding 1 L NS IV fluid bolus, has received a total of 3,250 cc of IV fluid boluses  Closely monitor vital signs.  Monitor fever curve and WBC   Elevated troponin > 1000, NSTEMI versus demand ischemia The patient is unable to provide a history due to lethargy High-sensitivity troponin uptrending Rectal aspirin 300 mg x daily until more alert and able to tolerate oral intake. Heparin drip started in the ED Fasting lipid panel in the morning Follow 2D echo  Acute metabolic encephalopathy in the setting of sepsis Treat underlying conditions Obtain stat ABG to rule out respiratory acidosis and to assess the severity of her hypoxemia. No head imaging done in the ED.  Acute hypoxic and likely acute on chronic hypercarbic respiratory failure secondary to atelectasis, seen on CT scan, versus others. Not on oxygen supplementation at baseline Appears to be a chronic CO2 retainer, upon medical records review Currently on 2 L nasal cannula with O2 saturation of 93% ABG 7.266/67/39.  Started BIPAP-NPO while on BIPAP Consulted PCCM, will see in consultation.  Left pyelonephritis, seen on CT  scan, POA Follow UA and urine culture for ID and sensitivities and to narrow down antibiotics. IV fluid hydration Monitor fever curve and WBC.  Hypovolemic hyponatremia Serum sodium 131 Continue IV fluid hydration Repeat chemistry panel in the morning  AKI, suspect prerenal in the setting of dehydration At baseline creatinine 0.4 with GFR greater than 60 Presented with creatinine of 1.29 with GFR 45. Continue IV fluid hydration Monitor  urine output with strict I's and O's and daily weight Avoid nephrotoxic agents, dehydration and hypotension. Repeat renal panel in the morning.  Hypokalemia Serum potassium 3.4 Repleted intravenously Obtain magnesium level  COPD Resume home regimen Maintain O2 saturation greater than 90%.  Hyperglycemia Obtain hemoglobin A1c Monitor for now Avoid hypoglycemia  Chronic normocytic anemia No overt bleeding reported Presenting hemoglobin 10.3 with baseline hemoglobin of 11 Monitor H&H  Essential hypertension Currently hypotensive due to severe sepsis Hold off home oral antihypertensives. Maintain MAP greater than 65 Closely monitor vital signs.    Critical care time: 65 minutes.     DVT prophylaxis: Heparin drip  Code Status: Full code  Family Communication: None at bedside  Disposition Plan: Admitted to progressive unit  Consults called: Cardiology consulted by EDP  Admission status: Inpatient status.   Status is: Inpatient The patient requires at least 2 midnights for further evaluation and treatment of present condition.   Kayleen Memos MD [facility] Pager [phone]  If 7PM-7AM, please contact night-coverage [email or web address] Password [email or web address]  06/30/2022, 2:09 AM

## 2022-06-30 NOTE — Progress Notes (Signed)
Patient requesting to take BiPAP off. Placed patient back on 4L [place] for transport to CT. Informed patient we would try it again once she returned from CT. RN aware.

## 2022-06-30 NOTE — Progress Notes (Signed)
ANTICOAGULATION CONSULT NOTE - follow-up Consult  Pharmacy Consult for heparin Indication: chest pain/ACS  No Known Allergies  Patient Measurements: Height: '5\' 3"'$  (160 cm) Weight: 72.6 kg (160 lb 0.9 oz) IBW/kg (Calculated) : 52.4 Heparin Dosing Weight: 67.6kg  Vital Signs: Temp: 97.9 F (36.6 C) (07/06 2037) Temp Source: Oral (07/06 2037) BP: 127/51 (07/06 2037) Pulse Rate: 80 (07/06 2037)  Labs: Recent Labs    06/29/22 2057 06/29/22 2350 06/30/22 0251 06/30/22 0451 06/30/22 1833  HGB 10.3*  --  10.2* 9.5*  --   HCT 34.3*  --  30.0* 31.1*  --   PLT 160  --   --  146*  --   APTT 29  --   --   --   --   LABPROT 14.9  --   --   --   --   INR 1.2  --   --   --   --   HEPARINUNFRC  --   --   --  <0.10* <0.10*  CREATININE 1.29*  --   --  1.12*  --   TROPONINIHS 896* 1,114*  --   --   --      Estimated Creatinine Clearance: 45.9 mL/min (A) (by C-G formula based on SCr of 1.12 mg/dL (H)).   Medical History: Past Medical History:  Diagnosis Date   Anxiety    Atrophic vaginitis 10/09/2015   Baker's cyst    left leg   Blood in urine 10/09/2015   CHF (congestive heart failure) (HCC)    COPD (chronic obstructive pulmonary disease) (HCC)    Heart murmur    Hematuria 10/09/2015   Hypertension    Nicotine addiction 04/07/2014   Pneumonia    Urinary urgency 01/25/2016   Vaginal bleeding 10/09/2015    Assessment: 67 YOF presenting with SOB, AMS, and elevated troponin. She is not on anticoagulation PTA. Chronic anemia stable. Pharmacy consulted to start IV heparin for ACS rule out with concern for NSTEMI.   Heparin level <0.10, s/p rate increase to 1000 units/hr  Goal of Therapy:  Heparin level 0.3-0.7 units/ml Monitor platelets by anticoagulation protocol: Yes   Plan:  Heparin 2000 units IV x 1, and rate increase to 1200 units/hr F/u 6 hour heparin level F/u cards recs and LOT  Bertis Ruddy, PharmD Clinical Pharmacist ED Pharmacist Phone #  [phone] 06/30/2022 9:25 PM

## 2022-06-30 NOTE — Progress Notes (Addendum)
ANTICOAGULATION CONSULT NOTE - follow-up Consult  Pharmacy Consult for heparin Indication: chest pain/ACS  No Known Allergies  Patient Measurements: Height: '5\' 3"'$  (160 cm) Weight: 72.6 kg (160 lb 0.9 oz) IBW/kg (Calculated) : 52.4 Heparin Dosing Weight: 67.6kg  Vital Signs: Temp: 97.9 F (36.6 C) (07/06 2037) Temp Source: Oral (07/06 2037) BP: 127/51 (07/06 2037) Pulse Rate: 80 (07/06 2037)  Labs: Recent Labs    06/29/22 2057 06/29/22 2350 06/30/22 0251 06/30/22 0451 06/30/22 1833  HGB 10.3*  --  10.2* 9.5*  --   HCT 34.3*  --  30.0* 31.1*  --   PLT 160  --   --  146*  --   APTT 29  --   --   --   --   LABPROT 14.9  --   --   --   --   INR 1.2  --   --   --   --   HEPARINUNFRC  --   --   --  <0.10* <0.10*  CREATININE 1.29*  --   --  1.12*  --   TROPONINIHS 896* 1,114*  --   --   --      Estimated Creatinine Clearance: 45.9 mL/min (A) (by C-G formula based on SCr of 1.12 mg/dL (H)).   Medical History: Past Medical History:  Diagnosis Date   Anxiety    Atrophic vaginitis 10/09/2015   Baker's cyst    left leg   Blood in urine 10/09/2015   CHF (congestive heart failure) (HCC)    COPD (chronic obstructive pulmonary disease) (HCC)    Heart murmur    Hematuria 10/09/2015   Hypertension    Nicotine addiction 04/07/2014   Pneumonia    Urinary urgency 01/25/2016   Vaginal bleeding 10/09/2015    Assessment: 71 YOF presenting with SOB, AMS, and elevated troponin. She is not on anticoagulation PTA. Chronic anemia stable. Pharmacy consulted to start IV heparin for ACS rule out with concern for NSTEMI.   In the process of transferring from the ED to the floor the RN noticed heparin to be infusing at the previous 850 units/hr rate. The ED RN adjusted the rate to 1000 units/hr (~ 2 hours ago ) and NO bolus was given. Will stay the course, forgo bolus given the rate adjustment was hours ago, and collect an early AM lab before making any additional adjustments.   Goal of  Therapy:  Heparin level 0.3-0.7 units/ml Monitor platelets by anticoagulation protocol: Yes   Plan:  Heparin 1000 units/hr Heparin level @ 0200  __________________________________________  Addendum:  Heparin level undetectable: no issues with infusion or overt bleeding reported. CBC stable.  -Heparin 2000 units IV x 1, and rate increase to 1200 units/hr F/u 6 hour heparin level F/u cards recs and LOT  Georga Bora, PharmD Clinical Pharmacist 06/30/2022 10:14 PM Please check AMION for all [place] numbers

## 2022-06-30 NOTE — Progress Notes (Addendum)
Progress Note  Patient Name: [name] Date of Encounter: 06/30/2022  [facility] Cardiologist: None   Subjective   Patient admitted overnight for sepsis secondary to pyelonephritis. We were consulted for elevated troponin. She denies any chest pain. She does not have any known cardiac history. She states she was told she had CHF this admission but no prior diagnosis of this. She has chronic shortness of breath due to her COPD and is on 2 L of O2 all the time at home. She has stable orthopnea which she has had for years and no PND or edema.  Inpatient Medications    Scheduled Meds:  aspirin  300 mg Rectal Daily   ipratropium-albuterol  3 mL Nebulization Q6H   predniSONE  40 mg Oral Q breakfast   Continuous Infusions:  heparin 850 Units/hr (06/29/22 2355)   lactated ringers Stopped (06/29/22 2116)   lactated ringers Stopped (06/29/22 2116)   PRN Meds: albuterol, HYDROcodone-acetaminophen, ondansetron (ZOFRAN) IV   Vital Signs    Vitals:   06/30/22 0310 06/30/22 0417 06/30/22 0530 06/30/22 0820  BP:   (!) 111/53 (!) 105/49  Pulse: 60  72 77  Resp: '13  15 20  '$ Temp:    98 F (36.7 C)  TempSrc:    Oral  SpO2: 98% 97% 90% 92%  Weight:      Height:       No intake or output data in the 24 hours ending 06/30/22 1026    06/29/2022    8:45 PM 06/15/2021    4:43 AM 03/25/2021    9:59 AM  Last 3 Weights  Weight (lbs) 160 lb 0.9 oz 160 lb 157 lb  Weight (kg) 72.6 kg 72.576 kg 71.215 kg      Telemetry    Normal sinus rhythm with rates in the 60s to 80s. PVCs and a couple of ventricular couplets noted. - Personally Reviewed  ECG    No new ECG tracing today. - Personally Reviewed  Physical Exam   GEN: Caucasian female in no acute distress.   Neck: No JVD. Cardiac: RRR. Soft II/VI systolic murmur noted. No rubs or gallops. Respiratory: No increased work of breathing. Decreased breath sounds in bilateral bases and faint expiratory wheezing. No significant  crackles. GI: Soft, non-distended, and non-tender. MS: No lower extremity edema. No deformity. Neuro:  Alert and oriented x3. No focal deficits. Psych: Normal affect.  Labs    High Sensitivity Troponin:   Recent Labs  Lab 06/29/22 2057 06/29/22 2350  TROPONINIHS 896* 1,114*     Chemistry Recent Labs  Lab 06/29/22 2057 06/30/22 0251 06/30/22 0451  NA 131* 128* 133*  K 3.4* 3.2* 3.6  CL 85*  --  91*  CO2 33*  --  32  GLUCOSE 152*  --  212*  BUN 26*  --  25*  CREATININE 1.29*  --  1.12*  CALCIUM 8.2*  --  7.8*  MG  --   --  2.3  PROT 6.4*  --  5.8*  ALBUMIN 3.2*  --  2.6*  AST 24  --  21  ALT 16  --  15  ALKPHOS 65  --  54  BILITOT 0.4  --  0.4  GFRNONAA 45*  --  54*  ANIONGAP 13  --  10    Lipids  Recent Labs  Lab 06/30/22 0451  CHOL 154  TRIG 98  HDL 50  LDLCALC 84  CHOLHDL 3.1    Hematology Recent Labs  Lab 06/29/22 2057 06/30/22 0251 06/30/22 0451  WBC 22.2*  --  16.5*  RBC 3.53*  --  3.22*  HGB 10.3* 10.2* 9.5*  HCT 34.3* 30.0* 31.1*  MCV 97.2  --  96.6  MCH 29.2  --  29.5  MCHC 30.0  --  30.5  RDW 12.8  --  12.9  PLT 160  --  146*   Thyroid No results for input(s): "TSH", "FREET4" in the last 168 hours.  BNPNo results for input(s): "BNP", "PROBNP" in the last 168 hours.  DDimer No results for input(s): "DDIMER" in the last 168 hours.   Radiology    CT HEAD WO CONTRAST (5MM)  Result Date: 06/30/2022 CLINICAL DATA:  Delirium. EXAM: CT HEAD WITHOUT CONTRAST TECHNIQUE: Contiguous axial images were obtained from the base of the skull through the vertex without intravenous contrast. RADIATION DOSE REDUCTION: This exam was performed according to the departmental dose-optimization program which includes automated exposure control, adjustment of the mA and/or kV according to patient size and/or use of iterative reconstruction technique. COMPARISON:  None Available. FINDINGS: Brain: No evidence of acute infarction, hemorrhage, hydrocephalus,  extra-axial collection or mass lesion/mass effect. Vascular: No hyperdense vessel or unexpected calcification. Skull: Normal. Negative for fracture or focal lesion. Sinuses/Orbits: No acute finding. Other: None. IMPRESSION: No acute intracranial pathology. Electronically Signed   By: Virgina Norfolk M.D.   On: 06/30/2022 03:34   CT CHEST ABDOMEN PELVIS W CONTRAST  Result Date: 06/30/2022 CLINICAL DATA:  Possible sepsis EXAM: CT CHEST, ABDOMEN, AND PELVIS WITH CONTRAST TECHNIQUE: Multidetector CT imaging of the chest, abdomen and pelvis was performed following the standard protocol during bolus administration of intravenous contrast. RADIATION DOSE REDUCTION: This exam was performed according to the departmental dose-optimization program which includes automated exposure control, adjustment of the mA and/or kV according to patient size and/or use of iterative reconstruction technique. CONTRAST:  41m OMNIPAQUE IOHEXOL 300 MG/ML  SOLN COMPARISON:  Chest x-ray from earlier in the same day, CT from 07/25/2018, 06/25/2020, 03/20/2013 FINDINGS: CT CHEST FINDINGS Cardiovascular: Atherosclerotic calcifications of the aorta are noted without aneurysmal dilatation. The heart is at the upper limits of normal in size. Pulmonary artery shows no central pulmonary embolus although not timed for embolus evaluation. Mediastinum/Nodes: Thoracic inlet is within normal limits. No hilar or mediastinal adenopathy is noted. The esophagus is within normal limits. Lungs/Pleura: Left lung is hyperinflated with emphysematous change. Mild left basilar atelectatic changes are seen. 7 mm nodule is noted in the left lower lobe best seen on image number 86 of series 5. Chronic scarring is noted in the right lung base and slightly progressed when compared with the prior CT from 2019. Calcified pleural plaques are noted. No acute infiltrate or sizable effusion is noted. Emphysematous changes are noted within the right lung as well.  Musculoskeletal: Degenerative changes of the thoracic spine are seen. No acute rib abnormality is noted. CT ABDOMEN PELVIS FINDINGS Hepatobiliary: No focal liver abnormality is seen. No gallstones, gallbladder wall thickening, or biliary dilatation. Pancreas: Unremarkable. No pancreatic ductal dilatation or surrounding inflammatory changes. Spleen: Normal in size without focal abnormality. Adrenals/Urinary Tract: Adrenal glands are well visualized with a stable 16 mm nodule in the left adrenal similar to that seen on prior exam (2014) consistent with adenoma. Kidneys are well visualized bilaterally. The left kidney is mildly enlarged with some patchy decreased enhancement identified likely related to pyelonephritis. The right kidney shows a normal enhancement pattern. No obstructive changes are seen. The bladder is within normal  limits. Stomach/Bowel: Scattered diverticular change of the colon is noted. No diverticulitis is seen. The appendix is within normal limits. Small bowel and stomach are unremarkable. Vascular/Lymphatic: Aortic atherosclerosis. No enlarged abdominal or pelvic lymph nodes. Reproductive: Status post hysterectomy. No adnexal masses. Other: No abdominal wall hernia or abnormality. No abdominopelvic ascites. Musculoskeletal: No acute bony abnormality is noted. IMPRESSION: CT of the chest: Chronic changes in the right lung with progressive scarring in the right lower lobe as well as calcified pleural plaques. 7 mm left lower lobe nodule. Recommend a non-contrast Chest CT at 6-12 months, then another non-contrast Chest CT at 18-24 months. These guidelines do not apply to immunocompromised patients and patients with cancer. Follow up in patients with significant comorbidities as clinically warranted. For lung cancer screening, adhere to Lung-RADS guidelines. Reference: Radiology. 2017; 284(1):228-43. CT of the abdomen and pelvis: Patchy enhancement in the left kidney consistent with pyelonephritis.  Minimal fullness of the proximal ureter is seen although no obstructing lesion is noted. Diverticulosis without diverticulitis. Stable left adrenal lesion dating back to 2014 consistent with adenoma. Aortic Atherosclerosis (ICD10-I70.0) and Emphysema (ICD10-J43.9). Electronically Signed   By: Inez Catalina M.D.   On: 06/30/2022 01:25   DG Chest Port 1 View  Result Date: 06/29/2022 CLINICAL DATA:  Questionable sepsis - evaluate for abnormality EXAM: PORTABLE CHEST 1 VIEW COMPARISON:  Chest radiograph 08/15/2021.  CT 07/25/2018 FINDINGS: Chronic volume loss in the right hemithorax with scarring at the right lung base. Mild cardiomegaly with stable mediastinal contours. Chronic bronchial thickening, emphysema on prior CT. Streaky atelectasis at the left lung base. No pneumothorax or pleural effusion. No acute osseous abnormalities. IMPRESSION: 1. Chronic volume loss in the right hemithorax with scarring at the right lung base. 2. Stable mild cardiomegaly. Streaky atelectasis at the left lung base. Electronically Signed   By: Keith Rake M.D.   On: 06/29/2022 21:18    Cardiac Studies   Echo pending.  Patient Profile     71 y.o. female with a history of COPD, hypertension, hyperlipidemia, chronic pain syndrome and anxiety who was admitted on 06/29/2022 with severe sepsis secondary to pyelonephritis after presenting with fever and altered mental status. Cardiology was consulted for further evaluation of elevated troponin.  Assessment & Plan    Elevated Troponin Patient was admitted for severe sepsis secondary to pyelonephritis and was found to have an elevated troponin. High-sensitivity troponin 896 >> 1, 114. EKG shows no acute ischemic changes.  - Patient denies any chest pain to me. - Echo pending. - Continue IV Heparin for now. - She has been load with Aspirin. Continue aspirin and high-intensity statin. - This very well could be demand ischemia from her acute illness. However, she does have  multiple CV risk factors (HTN, HLD, prior smoking history, family history). Will continue IV Heparin for now and continue to trend troponin to peak. Further recommendations pending Echo. I  Hypertension BP soft but stable. - Home Losartan-HCTZ on hold. Agree with this.  Follow BP  Hyperlipidemia Lipid panel this admission: Total Cholesterol 154, Triglycerides 98, HDL 50, LDL 84. LDL goal <70 given atherosclerotic calcification of aorta noted on CT this admission.  - On Lipitor '40mg'$  daily at home. Will increase to '80mg'$  daily. - Will need repeat lipid panel and LFTs in 6-8 weeks.  AKI Creatinine 1.29 on admission. Baseline around 0.4. Felt to be due to dehydration. - Slowly improving after fluids. Creatinine 1.12 today. - Continue to hold Losartan-HCTZ. - Management per primary team.  Hypokalemia  Potassium as low as 3.2 this admission.  - Supplemented by primary team.  Otherwise, per primary team: - Severe sepsis secondary to left pyelonephritis - Acute metabolic encephalopathy in setting of sepsis - COPD with acute on chronic respiratory failure - Chronic anemia  For questions or updates, please contact [facility] Please consult [email or web address] for contact info under        Signed, Darreld Mclean, PA-C  06/30/2022, 10:26 AM    Patient seen and examined   I agree with findings as noted above by Virgie Dad PT is a 5 yo with severe COPD (on home O2)  Brought by EMS to ED for evaluation of confusion and fever.   Treatead with nebulizes,Solumedrol and ABX   T 101.7    WBC increased.    Troponin elevated at 896 then 1114   Blood cult + ecoli    The pt denies CP   She said she had been feeling back for days prior to admission.   SOB   Says she is some  breathing better  now     On exam,  Neck  JVP is normal  Lungs   Moving air   No rales or wheezes Cardiac exam   RRR   NO S3   No significant murmurs Abd is supple   No hepatomegaly Ext are without edema  Warm feet    EKG  without acute changes   ST 117 bpm  Occasional PVCs    I have reviewed echo   LVEF is normal with normal wall motion RV appear mild to moderately dilated   Overall RVEF normal     Impression  Troponin elevation may be due to demand in the setting of sepsis.    No hx of CP but activity is limited because of COPD  RV dilation may be due to long hx COPD   She was hypoxic on admit.   On O2 at home     Note CT not adequate to exclude PE      REcomm:    Currently heparin for now    Would get another  troponin to see if trend is improving now  D dimer would be unreliable given sepsis  If troponin trending down, would sched a myoview prior to d/c to evaluate for ischemia    Other recommendatiions as noted above  Dorris Carnes MD

## 2022-06-30 NOTE — Progress Notes (Addendum)
The patient continues to be lethargic.  ABG without compensated respiratory acidosis: 7.266/67/39 on 2L [place].  BIPAP ordered.  PCCM will see in consultation.  With elevated troponin, now on heparin drip, will obtain CT head without contrast to r/o any overt intracranial abnormalities.

## 2022-06-30 NOTE — Progress Notes (Addendum)
ANTICOAGULATION CONSULT NOTE - follow-up Consult  Pharmacy Consult for heparin Indication: chest pain/ACS  No Known Allergies  Patient Measurements: Height: '5\' 3"'$  (160 cm) Weight: 72.6 kg (160 lb 0.9 oz) IBW/kg (Calculated) : 52.4 Heparin Dosing Weight: 67.6kg  Vital Signs: Temp: 99.7 F (37.6 C) (07/05 2223) Temp Source: Oral (07/05 2223) BP: 111/53 (07/06 0530) Pulse Rate: 72 (07/06 0530)  Labs: Recent Labs    06/29/22 2057 06/29/22 2350 06/30/22 0251 06/30/22 0451  HGB 10.3*  --  10.2* 9.5*  HCT 34.3*  --  30.0* 31.1*  PLT 160  --   --  146*  APTT 29  --   --   --   LABPROT 14.9  --   --   --   INR 1.2  --   --   --   HEPARINUNFRC  --   --   --  <0.10*  CREATININE 1.29*  --   --  1.12*  TROPONINIHS 896* 1,114*  --   --      Estimated Creatinine Clearance: 45.9 mL/min (A) (by C-G formula based on SCr of 1.12 mg/dL (H)).   Medical History: Past Medical History:  Diagnosis Date   Anxiety    Atrophic vaginitis 10/09/2015   Baker's cyst    left leg   Blood in urine 10/09/2015   CHF (congestive heart failure) (HCC)    COPD (chronic obstructive pulmonary disease) (HCC)    Heart murmur    Hematuria 10/09/2015   Hypertension    Nicotine addiction 04/07/2014   Pneumonia    Urinary urgency 01/25/2016   Vaginal bleeding 10/09/2015    Assessment: 29 YOF presenting with SOB, AMS, and elevated troponin. She is not on anticoagulation PTA. Chronic anemia stable. Pharmacy consulted to start IV heparin for ACS rule out with concern for NSTEMI.   Heparin level <0.10, subtherapeutic  Running appropriately in room. No issues with infusion overnight per nurse. No s/s of bleeding.   Goal of Therapy:  Heparin level 0.3-0.7 units/ml Monitor platelets by anticoagulation protocol: Yes   Plan:  Heparin re-bolus 2000 units IV x 1  Increase heparin gtt to 1000 units/hr F/u 8 hour heparin level F/u cards eval and recs  Thank you for allowing pharmacy to participate in  this patient's care.  Levonne Spiller, PharmD PGY1 Acute Care Resident  06/30/2022,7:06 AM

## 2022-06-30 NOTE — ED Notes (Signed)
..ED TO INPATIENT HANDOFF REPORT  ED Nurse Name and Phone #: Rayvion Stumph [phone]  S Name/Age/Gender [name] 71 y.o. female Room/Bed: 035C/035C  Code Status   Code Status: Full Code  Home/SNF/Other Home Patient oriented to: self, place, time, and situation Is this baseline? Yes   Triage Complete: Triage complete  Chief Complaint Sepsis ([place]) [A41.9]  Triage Note No notes on file   Allergies No Known Allergies  Level of Care/Admitting Diagnosis ED Disposition     ED Disposition  Admit   Condition  --   [place]: [place] [[id number]]  Level of Care: Progressive [102]  Admit to Progressive based on following criteria: MULTISYSTEM THREATS such as stable sepsis, metabolic/electrolyte imbalance with or without encephalopathy that is responding to early treatment.  May admit patient to [facility] or [facility] if equivalent level of care is available:: Yes  Covid Evaluation: Asymptomatic - no recent exposure (last 10 days) testing not required  Diagnosis: Sepsis [facility]) [[id number]]  Admitting Physician: Kayleen Memos [[id number]]  Attending Physician: Kayleen Memos [[id number]]  Estimated length of stay: past midnight tomorrow  Certification:: I certify this patient will need inpatient services for at least 2 midnights          B Medical/Surgery History Past Medical History:  Diagnosis Date   Anxiety    Atrophic vaginitis 10/09/2015   Baker's cyst    left leg   Blood in urine 10/09/2015   CHF (congestive heart failure) ([place])    COPD (chronic obstructive pulmonary disease) ([place])    Heart murmur    Hematuria 10/09/2015   Hypertension    Nicotine addiction 04/07/2014   Pneumonia    Urinary urgency 01/25/2016   Vaginal bleeding 10/09/2015   Past Surgical History:  Procedure Laterality Date   ABDOMINAL HYSTERECTOMY     CATARACT EXTRACTION Left 03/2015   CESAREAN SECTION     COLONOSCOPY  10/19/2004   Dr. Gala Romney- internal  hemorrhoids, o/w normal rectum, normal colon   COLONOSCOPY N/A 07/30/2014   KPT:WSFKCLE diverticulosis. Colonic polyp-removed TA. repeat TCS 07/2021   COLONOSCOPY WITH PROPOFOL N/A 03/29/2021   diverticulosis in sigmoid and descending colon, one 5 mm polyp ascending colon (adenoma), non-bleeding internal hemorrhoids. 5 year surveillance   ESOPHAGOGASTRODUODENOSCOPY N/A 07/30/2014   XNT:ZGYFVC EGD   POLYPECTOMY  03/29/2021   Procedure: POLYPECTOMY;  Surgeon: Daneil Dolin, MD;  Location: [facility];  Service: Endoscopy;;   RADIAL HEAD ARTHROPLASTY Left 06/23/2017   Procedure: RADIAL HEAD ARTHROPLASTY WITH LIGAMENT REPAIR;  Surgeon: Meredith Pel, MD;  Location: [place];  Service: Orthopedics;  Laterality: Left;     A IV Location/Drains/Wounds Patient Lines/Drains/Airways Status     Active Line/Drains/Airways     Name Placement date Placement time Site Days   Peripheral IV 06/29/22 20 G Anterior;Distal;Left;Upper Arm 06/29/22  2041  Arm  1   Peripheral IV 06/29/22 20 G Anterior;Distal;Right Forearm 06/29/22  2107  Forearm  1   Incision (Closed) 06/23/17 Arm Left 06/23/17  2006  -- 1833            Intake/Output Last 24 hours No intake or output data in the 24 hours ending 06/30/22 1853  Labs/Imaging Results for orders placed or performed during the hospital encounter of 06/29/22 (from the past 48 hour(s))  Resp Panel by RT-PCR (Flu A&B, Covid) Anterior Nasal Swab     Status: None   Collection Time: 06/29/22  8:54 PM   Specimen: Anterior  Nasal Swab  Result Value Ref Range   SARS Coronavirus 2 by RT PCR NEGATIVE NEGATIVE    Comment: (NOTE) SARS-CoV-2 target nucleic acids are NOT DETECTED.  The SARS-CoV-2 RNA is generally detectable in upper respiratory specimens during the acute phase of infection. The lowest concentration of SARS-CoV-2 viral copies this assay can detect is 138 copies/mL. A negative result does not preclude SARS-Cov-2 infection and should not be used as the  sole basis for treatment or other patient management decisions. A negative result may occur with  improper specimen collection/handling, submission of specimen other than nasopharyngeal swab, presence of viral mutation(s) within the areas targeted by this assay, and inadequate number of viral copies(<138 copies/mL). A negative result must be combined with clinical observations, patient history, and epidemiological information. The expected result is Negative.  Fact Sheet for Patients:  EntrepreneurPulse.com.au  Fact Sheet for Healthcare Providers:  IncredibleEmployment.be  This test is no t yet approved or cleared by the [place] FDA and  has been authorized for detection and/or diagnosis of SARS-CoV-2 by FDA under an Emergency Use Authorization (EUA). This EUA will remain  in effect (meaning this test can be used) for the duration of the COVID-19 declaration under Section 564(b)(1) of the Act, 21 U.S.C.section 360bbb-3(b)(1), unless the authorization is terminated  or revoked sooner.       Influenza A by PCR NEGATIVE NEGATIVE   Influenza B by PCR NEGATIVE NEGATIVE    Comment: (NOTE) The Xpert Xpress SARS-CoV-2/FLU/RSV plus assay is intended as an aid in the diagnosis of influenza from Nasopharyngeal swab specimens and should not be used as a sole basis for treatment. Nasal washings and aspirates are unacceptable for Xpert Xpress SARS-CoV-2/FLU/RSV testing.  Fact Sheet for Patients: EntrepreneurPulse.com.au  Fact Sheet for Healthcare Providers: IncredibleEmployment.be  This test is not yet approved or cleared by the [place] FDA and has been authorized for detection and/or diagnosis of SARS-CoV-2 by FDA under an Emergency Use Authorization (EUA). This EUA will remain in effect (meaning this test can be used) for the duration of the COVID-19 declaration under Section 564(b)(1) of the Act, 21  U.S.C. section 360bbb-3(b)(1), unless the authorization is terminated or revoked.  Performed at [facility], [place] [address], [place], [place] [address]   Blood Culture (routine x 2)     Status: Abnormal (Preliminary result)   Collection Time: 06/29/22  8:54 PM   Specimen: BLOOD  Result Value Ref Range   Specimen Description BLOOD SITE NOT SPECIFIED    Special Requests      BOTTLES DRAWN AEROBIC AND ANAEROBIC Blood Culture adequate volume   Culture  Setup Time      GRAM NEGATIVE RODS IN BOTH AEROBIC AND ANAEROBIC BOTTLES Organism ID to follow    Culture (A)     ESCHERICHIA COLI IN BOTH AEROBIC AND ANAEROBIC BOTTLES CRITICAL RESULT CALLED TO, READ BACK BY AND VERIFIED WITH: PHARMD EMILY SINCLAIR [id number] AT 1242 BY EC Performed at [facility], [place] [address] [place], [place] [address]    Report Status PENDING   Blood Culture ID Panel (Reflexed)     Status: Abnormal   Collection Time: 06/29/22  8:54 PM  Result Value Ref Range   Enterococcus faecalis NOT DETECTED NOT DETECTED   Enterococcus Faecium NOT DETECTED NOT DETECTED   Listeria monocytogenes NOT DETECTED NOT DETECTED   Staphylococcus species NOT DETECTED NOT DETECTED   Staphylococcus aureus (BCID) NOT DETECTED NOT DETECTED   Staphylococcus epidermidis NOT DETECTED NOT  DETECTED   Staphylococcus lugdunensis NOT DETECTED NOT DETECTED   Streptococcus species NOT DETECTED NOT DETECTED   Streptococcus agalactiae NOT DETECTED NOT DETECTED   Streptococcus pneumoniae NOT DETECTED NOT DETECTED   Streptococcus pyogenes NOT DETECTED NOT DETECTED   A.calcoaceticus-baumannii NOT DETECTED NOT DETECTED   Bacteroides fragilis NOT DETECTED NOT DETECTED   Enterobacterales DETECTED (A) NOT DETECTED    Comment: Enterobacterales represent a large order of gram negative bacteria, not a single organism. CRITICAL RESULT CALLED TO, READ BACK BY AND VERIFIED WITH: PHARMD EMILY SINCLAIR [id number] AT 3545 BY EC    Enterobacter cloacae  complex NOT DETECTED NOT DETECTED   Escherichia coli DETECTED (A) NOT DETECTED    Comment: CRITICAL RESULT CALLED TO, READ BACK BY AND VERIFIED WITH: PHARMD EMILY SINCLAIR [id number] AT 1242 BY EC    Klebsiella aerogenes NOT DETECTED NOT DETECTED   Klebsiella oxytoca NOT DETECTED NOT DETECTED   Klebsiella pneumoniae NOT DETECTED NOT DETECTED   Proteus species NOT DETECTED NOT DETECTED   Salmonella species NOT DETECTED NOT DETECTED   Serratia marcescens NOT DETECTED NOT DETECTED   Haemophilus influenzae NOT DETECTED NOT DETECTED   Neisseria meningitidis NOT DETECTED NOT DETECTED   Pseudomonas aeruginosa NOT DETECTED NOT DETECTED   Stenotrophomonas maltophilia NOT DETECTED NOT DETECTED   Candida albicans NOT DETECTED NOT DETECTED   Candida auris NOT DETECTED NOT DETECTED   Candida glabrata NOT DETECTED NOT DETECTED   Candida krusei NOT DETECTED NOT DETECTED   Candida parapsilosis NOT DETECTED NOT DETECTED   Candida tropicalis NOT DETECTED NOT DETECTED   Cryptococcus neoformans/gattii NOT DETECTED NOT DETECTED   CTX-M ESBL NOT DETECTED NOT DETECTED   Carbapenem resistance IMP NOT DETECTED NOT DETECTED   Carbapenem resistance KPC NOT DETECTED NOT DETECTED   Carbapenem resistance NDM NOT DETECTED NOT DETECTED   Carbapenem resist OXA 48 LIKE NOT DETECTED NOT DETECTED   Carbapenem resistance VIM NOT DETECTED NOT DETECTED    Comment: Performed at [facility], [address] [place], [place] [address]  Lactic acid, plasma     Status: None   Collection Time: 06/29/22  8:57 PM  Result Value Ref Range   Lactic Acid, Venous 1.4 0.5 - 1.9 mmol/L    Comment: Performed at [facility] [address] [place], [place] [address]  Comprehensive metabolic panel     Status: Abnormal   Collection Time: 06/29/22  8:57 PM  Result Value Ref Range   Sodium 131 (L) 135 - 145 mmol/L   Potassium 3.4 (L) 3.5 - 5.1 mmol/L   Chloride 85 (L) 98 - 111 mmol/L   CO2 33 (H) 22 - 32 mmol/L   Glucose,  Bld 152 (H) 70 - 99 mg/dL    Comment: Glucose reference range applies only to samples taken after fasting for at least 8 hours.   BUN 26 (H) 8 - 23 mg/dL   Creatinine, Ser 1.29 (H) 0.44 - 1.00 mg/dL   Calcium 8.2 (L) 8.9 - 10.3 mg/dL   Total Protein 6.4 (L) 6.5 - 8.1 g/dL   Albumin 3.2 (L) 3.5 - 5.0 g/dL   AST 24 15 - 41 U/L   ALT 16 0 - 44 U/L   Alkaline Phosphatase 65 38 - 126 U/L   Total Bilirubin 0.4 0.3 - 1.2 mg/dL   GFR, Estimated 45 (L) >60 mL/min    Comment: (NOTE) Calculated using the CKD-EPI Creatinine Equation (2021)    Anion gap 13 5 - 15  Comment: Performed at [facility], [place] [address] [place], [place] [address]  CBC with Differential     Status: Abnormal   Collection Time: 06/29/22  8:57 PM  Result Value Ref Range   WBC 22.2 (H) 4.0 - 10.5 K/uL   RBC 3.53 (L) 3.87 - 5.11 MIL/uL   Hemoglobin 10.3 (L) 12.0 - 15.0 g/dL   HCT 34.3 (L) 36.0 - 46.0 %   MCV 97.2 80.0 - 100.0 fL   MCH 29.2 26.0 - 34.0 pg   MCHC 30.0 30.0 - 36.0 g/dL   RDW 12.8 11.5 - 15.5 %   Platelets 160 150 - 400 K/uL   nRBC 0.0 0.0 - 0.2 %   Neutrophils Relative % 87 %   Neutro Abs 19.4 (H) 1.7 - 7.7 K/uL   Lymphocytes Relative 5 %   Lymphs Abs 1.0 0.7 - 4.0 K/uL   Monocytes Relative 7 %   Monocytes Absolute 1.6 (H) 0.1 - 1.0 K/uL   Eosinophils Relative 0 %   Eosinophils Absolute 0.0 0.0 - 0.5 K/uL   Basophils Relative 0 %   Basophils Absolute 0.1 0.0 - 0.1 K/uL   Immature Granulocytes 1 %   Abs Immature Granulocytes 0.15 (H) 0.00 - 0.07 K/uL    Comment: Performed at [facility] [address], [place], [place] [address]  Protime-INR     Status: None   Collection Time: 06/29/22  8:57 PM  Result Value Ref Range   Prothrombin Time 14.9 11.4 - 15.2 seconds   INR 1.2 0.8 - 1.2    Comment: (NOTE) INR goal varies based on device and disease states. Performed at [facility], [place] [address], [place], [place] [address]   APTT     Status: None   Collection Time:  06/29/22  8:57 PM  Result Value Ref Range   aPTT 29 24 - 36 seconds    Comment: Performed at [facility] [address], [place], [place] [address]  Troponin I (High Sensitivity)     Status: Abnormal   Collection Time: 06/29/22  8:57 PM  Result Value Ref Range   Troponin I (High Sensitivity) 896 (HH) <18 ng/L    Comment: CRITICAL RESULT CALLED TO, READ BACK BY AND VERIFIED WITH:  D. HOLLIS, RN, 2157, 06/29/22, EADEDOKUN (NOTE) Elevated high sensitivity troponin I (hsTnI) values and significant  changes across serial measurements may suggest ACS but many other  chronic and acute conditions are known to elevate hsTnI results.  Refer to the Links section for chest pain algorithms and additional  guidance. Performed at [facility], [place] [address], [place], [place] [address]   Blood Culture (routine x 2)     Status: None (Preliminary result)   Collection Time: 06/29/22  9:03 PM   Specimen: BLOOD  Result Value Ref Range   Specimen Description BLOOD RIGHT ANTECUBITAL    Special Requests      BOTTLES DRAWN AEROBIC AND ANAEROBIC Blood Culture adequate volume   Culture  Setup Time      GRAM NEGATIVE RODS ANAEROBIC BOTTLE ONLY CRITICAL VALUE NOTED.  VALUE IS CONSISTENT WITH PREVIOUSLY REPORTED AND CALLED VALUE. Performed at [facility], [place] [address] [place],  [address]    Culture GRAM NEGATIVE RODS    Report Status PENDING   Lactic acid, plasma     Status: None   Collection Time: 06/29/22 11:50 PM  Result Value Ref Range   Lactic Acid,  Venous 1.4 0.5 - 1.9 mmol/L    Comment: Performed at [facility], [place] [address], [place], [place] [address]  Troponin I (High Sensitivity)     Status: Abnormal   Collection Time: 06/29/22 11:50 PM  Result Value Ref Range   Troponin I (High Sensitivity) 1,114 (HH) <18 ng/L    Comment: CRITICAL VALUE NOTED.  VALUE IS CONSISTENT WITH PREVIOUSLY REPORTED AND CALLED VALUE. (NOTE) Elevated high sensitivity troponin I  (hsTnI) values and significant  changes across serial measurements may suggest ACS but many other  chronic and acute conditions are known to elevate hsTnI results.  Refer to the Links section for chest pain algorithms and additional  guidance. Performed at [facility], [place] [address] [place], [place] [address]   I-Stat arterial blood gas, ED     Status: Abnormal   Collection Time: 06/30/22  2:51 AM  Result Value Ref Range   pH, Arterial 7.266 (L) 7.35 - 7.45   pCO2 arterial 81.7 (HH) 32 - 48 mmHg   pO2, Arterial 67 (L) 83 - 108 mmHg   Bicarbonate 36.9 (H) 20.0 - 28.0 mmol/L   TCO2 39 (H) 22 - 32 mmol/L   O2 Saturation 88 %   Acid-Base Excess 8.0 (H) 0.0 - 2.0 mmol/L   Sodium 128 (L) 135 - 145 mmol/L   Potassium 3.2 (L) 3.5 - 5.1 mmol/L   Calcium, Ion 1.14 (L) 1.15 - 1.40 mmol/L   HCT 30.0 (L) 36.0 - 46.0 %   Hemoglobin 10.2 (L) 12.0 - 15.0 g/dL   Patient temperature 99.7 F    Collection site RADIAL, ALLEN'S TEST ACCEPTABLE    Drawn by Operator    Sample type ARTERIAL    Comment NOTIFIED PHYSICIAN   Heparin level (unfractionated)     Status: Abnormal   Collection Time: 06/30/22  4:51 AM  Result Value Ref Range   Heparin Unfractionated <0.10 (L) 0.30 - 0.70 IU/mL    Comment: (NOTE) The clinical reportable range upper limit is being lowered to >1.10 to align with the FDA approved guidance for the current laboratory assay.  If heparin results are below expected values, and patient dosage has  been confirmed, suggest follow up testing of antithrombin III levels. Performed at [facility], [place] [address], [place], [place] [address]   CBC     Status: Abnormal   Collection Time: 06/30/22  4:51 AM  Result Value Ref Range   WBC 16.5 (H) 4.0 - 10.5 K/uL   RBC 3.22 (L) 3.87 - 5.11 MIL/uL   Hemoglobin 9.5 (L) 12.0 - 15.0 g/dL   HCT 31.1 (L) 36.0 - 46.0 %   MCV 96.6 80.0 - 100.0 fL   MCH 29.5 26.0 - 34.0 pg   MCHC 30.5 30.0 - 36.0 g/dL   RDW 12.9 11.5 - 15.5 %    Platelets 146 (L) 150 - 400 K/uL   nRBC 0.0 0.0 - 0.2 %    Comment: Performed at [facility], [place] [address] [place], [place] [address]  Lipid panel     Status: None   Collection Time: 06/30/22  4:51 AM  Result Value Ref Range   Cholesterol 154 0 - 200 mg/dL   Triglycerides 98 <150 mg/dL   HDL 50 >40 mg/dL   Total CHOL/HDL Ratio 3.1 RATIO   VLDL 20 0 - 40 mg/dL   LDL Cholesterol 84 0 - 99 mg/dL    Comment:  Total Cholesterol/HDL:CHD Risk Coronary Heart Disease Risk Table                     Men   Women  1/2 Average Risk   3.4   3.3  Average Risk       5.0   4.4  2 X Average Risk   9.6   7.1  3 X Average Risk  23.4   11.0        Use the calculated Patient Ratio above and the CHD Risk Table to determine the patient's CHD Risk.        ATP III CLASSIFICATION (LDL):  <100     mg/dL   Optimal  100-129  mg/dL   Near or Above                    Optimal  130-159  mg/dL   Borderline  160-189  mg/dL   High  >190     mg/dL   Very High Performed at [facility] [address], [place], [place] [address]   HIV Antibody (routine testing w rflx)     Status: None   Collection Time: 06/30/22  4:51 AM  Result Value Ref Range   HIV Screen 4th Generation wRfx Non Reactive Non Reactive    Comment: Performed at [facility], [place] [address], [place], [place] [address]  Comprehensive metabolic panel     Status: Abnormal   Collection Time: 06/30/22  4:51 AM  Result Value Ref Range   Sodium 133 (L) 135 - 145 mmol/L   Potassium 3.6 3.5 - 5.1 mmol/L   Chloride 91 (L) 98 - 111 mmol/L   CO2 32 22 - 32 mmol/L   Glucose, Bld 212 (H) 70 - 99 mg/dL    Comment: Glucose reference range applies only to samples taken after fasting for at least 8 hours.   BUN 25 (H) 8 - 23 mg/dL   Creatinine, Ser 1.12 (H) 0.44 - 1.00 mg/dL   Calcium 7.8 (L) 8.9 - 10.3 mg/dL   Total Protein 5.8 (L) 6.5 - 8.1 g/dL   Albumin 2.6 (L) 3.5 - 5.0 g/dL   AST 21 15 - 41 U/L   ALT 15 0 - 44 U/L    Alkaline Phosphatase 54 38 - 126 U/L   Total Bilirubin 0.4 0.3 - 1.2 mg/dL   GFR, Estimated 54 (L) >60 mL/min    Comment: (NOTE) Calculated using the CKD-EPI Creatinine Equation (2021)    Anion gap 10 5 - 15    Comment: Performed at [facility], [place] [address] [place], [place] [address]  Magnesium     Status: None   Collection Time: 06/30/22  4:51 AM  Result Value Ref Range   Magnesium 2.3 1.7 - 2.4 mg/dL    Comment: Performed at [facility] [address] [place], [place] [address]  Phosphorus     Status: None   Collection Time: 06/30/22  4:51 AM  Result Value Ref Range   Phosphorus 2.8 2.5 - 4.6 mg/dL    Comment: Performed at [facility] [address], [place],  [address]  Urinalysis, Routine w reflex microscopic Urine, Clean Catch     Status: Abnormal   Collection Time: 06/30/22  8:13 AM  Result Value Ref Range   Color, Urine YELLOW YELLOW   APPearance HAZY (A) CLEAR   Specific Gravity, Urine 1.023 1.005 - 1.030  pH 6.0 5.0 - 8.0   Glucose, UA NEGATIVE NEGATIVE mg/dL   Hgb urine dipstick MODERATE (A) NEGATIVE   Bilirubin Urine NEGATIVE NEGATIVE   Ketones, ur NEGATIVE NEGATIVE mg/dL   Protein, ur NEGATIVE NEGATIVE mg/dL   Nitrite NEGATIVE NEGATIVE   Leukocytes,Ua MODERATE (A) NEGATIVE   RBC / HPF 0-5 0 - 5 RBC/hpf   WBC, UA 21-50 0 - 5 WBC/hpf   Bacteria, UA RARE (A) NONE SEEN   Squamous Epithelial / LPF 0-5 0 - 5    Comment: Performed at [facility], [place] [address] [place],  [address]   ECHOCARDIOGRAM COMPLETE  Result Date: 06/30/2022    ECHOCARDIOGRAM REPORT   Patient Name:   KAMI KUBE Date of Exam: 06/30/2022 Medical Rec #:  [id number]        Height:       63.0 in Accession #:    [id number]       Weight:       160.1 lb Date of Birth:  06/26/1951        BSA:          1.759 m Patient Age:    15 years         BP:           116/57 mmHg Patient Gender: F                HR:           84 bpm. Exam Location:  Inpatient  Procedure: 2D Echo, Cardiac Doppler and Color Doppler Indications:    Elevated Troponin  History:        Patient has no prior history of Echocardiogram examinations.                 CHF, COPD; Risk Factors:Hypertension.  Sonographer:    Bernadene Person RDCS Referring Phys: [id number] [place]  1. Left ventricular ejection fraction, by estimation, is 55 to 60%. The left ventricle has normal function. The left ventricle has no regional wall motion abnormalities. Left ventricular diastolic parameters are consistent with Grade I diastolic dysfunction (impaired relaxation).  2. Right ventricular systolic function is normal. The right ventricular size is moderately enlarged. There is mildly elevated pulmonary artery systolic pressure. The estimated right ventricular systolic pressure is 94.8 mmHg.  3. Left atrial size was mildly dilated.  4. The mitral valve is normal in structure. No evidence of mitral valve regurgitation. No evidence of mitral stenosis.  5. Tricuspid valve regurgitation is moderate.  6. The aortic valve is normal in structure. Aortic valve regurgitation is not visualized. No aortic stenosis is present.  7. The inferior vena cava is normal in size with greater than 50% respiratory variability, suggesting right atrial pressure of 3 mmHg. FINDINGS  Left Ventricle: Left ventricular ejection fraction, by estimation, is 55 to 60%. The left ventricle has normal function. The left ventricle has no regional wall motion abnormalities. The left ventricular internal cavity size was normal in size. There is  no left ventricular hypertrophy. Left ventricular diastolic parameters are consistent with Grade I diastolic dysfunction (impaired relaxation). Right Ventricle: The right ventricular size is moderately enlarged. No increase in right ventricular wall thickness. Right ventricular systolic function is normal. There is mildly elevated pulmonary artery systolic pressure. The tricuspid regurgitant  velocity is 3.22 m/s, and with an assumed right atrial pressure of 3 mmHg, the estimated right ventricular systolic pressure is 54.6 mmHg. Left Atrium: Left atrial size was mildly  dilated. Right Atrium: Right atrial size was normal in size. Pericardium: There is no evidence of pericardial effusion. Mitral Valve: The mitral valve is normal in structure. There is mild thickening of the mitral valve leaflet(s). There is mild calcification of the mitral valve leaflet(s). Mild mitral annular calcification. No evidence of mitral valve regurgitation. No evidence of mitral valve stenosis. Tricuspid Valve: The tricuspid valve is normal in structure. Tricuspid valve regurgitation is moderate . No evidence of tricuspid stenosis. Aortic Valve: The aortic valve is normal in structure. Aortic valve regurgitation is not visualized. No aortic stenosis is present. Pulmonic Valve: The pulmonic valve was normal in structure. Pulmonic valve regurgitation is not visualized. No evidence of pulmonic stenosis. Aorta: The aortic root is normal in size and structure. Venous: The inferior vena cava is normal in size with greater than 50% respiratory variability, suggesting right atrial pressure of 3 mmHg. IAS/Shunts: No atrial level shunt detected by color flow Doppler.  LEFT VENTRICLE PLAX 2D LVIDd:         5.00 cm      Diastology LVIDs:         3.10 cm      LV e' medial:    5.33 cm/s LV PW:         0.80 cm      LV E/e' medial:  23.1 LV IVS:        0.90 cm      LV e' lateral:   5.85 cm/s LVOT diam:     1.80 cm      LV E/e' lateral: 21.0 LV SV:         55 LV SV Index:   31 LVOT Area:     2.54 cm  LV Volumes (MOD) LV vol d, MOD A2C: 120.0 ml LV vol d, MOD A4C: 67.1 ml LV vol s, MOD A2C: 56.5 ml LV vol s, MOD A4C: 23.8 ml LV SV MOD A2C:     63.5 ml LV SV MOD A4C:     67.1 ml LV SV MOD BP:      59.7 ml RIGHT VENTRICLE RV S prime:     10.70 cm/s TAPSE (M-mode): 2.6 cm LEFT ATRIUM             Index        RIGHT ATRIUM           Index LA diam:         6.20 cm 3.52 cm/m   RA Area:     15.90 cm LA Vol (A2C):   64.5 ml 36.67 ml/m  RA Volume:   52.10 ml  29.62 ml/m LA Vol (A4C):   61.0 ml 34.68 ml/m LA Biplane Vol: 66.7 ml 37.92 ml/m  AORTIC VALVE LVOT Vmax:   107.00 cm/s LVOT Vmean:  70.600 cm/s LVOT VTI:    0.215 m  AORTA Ao Root diam: 2.90 cm Ao Asc diam:  2.90 cm MITRAL VALVE                TRICUSPID VALVE MV Area (PHT): 4.06 cm     TR Peak grad:   41.5 mmHg MV Decel Time: 187 msec     TR Vmax:        322.00 cm/s MV E velocity: 123.00 cm/s MV A velocity: 127.00 cm/s  SHUNTS MV E/A ratio:  0.97         Systemic VTI:  0.22 m  Systemic Diam: 1.80 cm Candee Furbish MD Electronically signed by Candee Furbish MD Signature Date/Time: 06/30/2022/12:03:16 PM    Final    CT HEAD WO CONTRAST (5MM)  Result Date: 06/30/2022 CLINICAL DATA:  Delirium. EXAM: CT HEAD WITHOUT CONTRAST TECHNIQUE: Contiguous axial images were obtained from the base of the skull through the vertex without intravenous contrast. RADIATION DOSE REDUCTION: This exam was performed according to the departmental dose-optimization program which includes automated exposure control, adjustment of the mA and/or kV according to patient size and/or use of iterative reconstruction technique. COMPARISON:  None Available. FINDINGS: Brain: No evidence of acute infarction, hemorrhage, hydrocephalus, extra-axial collection or mass lesion/mass effect. Vascular: No hyperdense vessel or unexpected calcification. Skull: Normal. Negative for fracture or focal lesion. Sinuses/Orbits: No acute finding. Other: None. IMPRESSION: No acute intracranial pathology. Electronically Signed   By: Virgina Norfolk M.D.   On: 06/30/2022 03:34   CT CHEST ABDOMEN PELVIS W CONTRAST  Result Date: 06/30/2022 CLINICAL DATA:  Possible sepsis EXAM: CT CHEST, ABDOMEN, AND PELVIS WITH CONTRAST TECHNIQUE: Multidetector CT imaging of the chest, abdomen and pelvis was performed following the standard protocol  during bolus administration of intravenous contrast. RADIATION DOSE REDUCTION: This exam was performed according to the departmental dose-optimization program which includes automated exposure control, adjustment of the mA and/or kV according to patient size and/or use of iterative reconstruction technique. CONTRAST:  35m OMNIPAQUE IOHEXOL 300 MG/ML  SOLN COMPARISON:  Chest x-ray from earlier in the same day, CT from 07/25/2018, 06/25/2020, 03/20/2013 FINDINGS: CT CHEST FINDINGS Cardiovascular: Atherosclerotic calcifications of the aorta are noted without aneurysmal dilatation. The heart is at the upper limits of normal in size. Pulmonary artery shows no central pulmonary embolus although not timed for embolus evaluation. Mediastinum/Nodes: Thoracic inlet is within normal limits. No hilar or mediastinal adenopathy is noted. The esophagus is within normal limits. Lungs/Pleura: Left lung is hyperinflated with emphysematous change. Mild left basilar atelectatic changes are seen. 7 mm nodule is noted in the left lower lobe best seen on image number 86 of series 5. Chronic scarring is noted in the right lung base and slightly progressed when compared with the prior CT from 2019. Calcified pleural plaques are noted. No acute infiltrate or sizable effusion is noted. Emphysematous changes are noted within the right lung as well. Musculoskeletal: Degenerative changes of the thoracic spine are seen. No acute rib abnormality is noted. CT ABDOMEN PELVIS FINDINGS Hepatobiliary: No focal liver abnormality is seen. No gallstones, gallbladder wall thickening, or biliary dilatation. Pancreas: Unremarkable. No pancreatic ductal dilatation or surrounding inflammatory changes. Spleen: Normal in size without focal abnormality. Adrenals/Urinary Tract: Adrenal glands are well visualized with a stable 16 mm nodule in the left adrenal similar to that seen on prior exam (2014) consistent with adenoma. Kidneys are well visualized  bilaterally. The left kidney is mildly enlarged with some patchy decreased enhancement identified likely related to pyelonephritis. The right kidney shows a normal enhancement pattern. No obstructive changes are seen. The bladder is within normal limits. Stomach/Bowel: Scattered diverticular change of the colon is noted. No diverticulitis is seen. The appendix is within normal limits. Small bowel and stomach are unremarkable. Vascular/Lymphatic: Aortic atherosclerosis. No enlarged abdominal or pelvic lymph nodes. Reproductive: Status post hysterectomy. No adnexal masses. Other: No abdominal wall hernia or abnormality. No abdominopelvic ascites. Musculoskeletal: No acute bony abnormality is noted. IMPRESSION: CT of the chest: Chronic changes in the right lung with progressive scarring in the right lower lobe as well as calcified pleural plaques. 7  mm left lower lobe nodule. Recommend a non-contrast Chest CT at 6-12 months, then another non-contrast Chest CT at 18-24 months. These guidelines do not apply to immunocompromised patients and patients with cancer. Follow up in patients with significant comorbidities as clinically warranted. For lung cancer screening, adhere to Lung-RADS guidelines. Reference: Radiology. 2017; 284(1):228-43. CT of the abdomen and pelvis: Patchy enhancement in the left kidney consistent with pyelonephritis. Minimal fullness of the proximal ureter is seen although no obstructing lesion is noted. Diverticulosis without diverticulitis. Stable left adrenal lesion dating back to 2014 consistent with adenoma. Aortic Atherosclerosis (ICD10-I70.0) and Emphysema (ICD10-J43.9). Electronically Signed   By: Inez Catalina M.D.   On: 06/30/2022 01:25   DG Chest Port 1 View  Result Date: 06/29/2022 CLINICAL DATA:  Questionable sepsis - evaluate for abnormality EXAM: PORTABLE CHEST 1 VIEW COMPARISON:  Chest radiograph 08/15/2021.  CT 07/25/2018 FINDINGS: Chronic volume loss in the right hemithorax with  scarring at the right lung base. Mild cardiomegaly with stable mediastinal contours. Chronic bronchial thickening, emphysema on prior CT. Streaky atelectasis at the left lung base. No pneumothorax or pleural effusion. No acute osseous abnormalities. IMPRESSION: 1. Chronic volume loss in the right hemithorax with scarring at the right lung base. 2. Stable mild cardiomegaly. Streaky atelectasis at the left lung base. Electronically Signed   By: Keith Rake M.D.   On: 06/29/2022 21:18    Pending Labs Unresulted Labs (From admission, onward)     Start     Ordered   07/01/22 0034  Basic metabolic panel  Daily,   R      06/30/22 0737   07/01/22 0500  Magnesium  Tomorrow morning,   R        06/30/22 0737   07/01/22 0500  Hemoglobin A1c  Tomorrow morning,   R        06/30/22 0737   07/01/22 0500  Vitamin B12  Tomorrow morning,   R        06/30/22 0737   06/30/22 1800  Heparin level (unfractionated)  Once-Timed,   TIMED        06/30/22 0718   06/30/22 0500  Heparin level (unfractionated)  Daily,   R     See Hyperspace for full Linked Orders Report.   06/29/22 2239   06/30/22 0500  CBC  Daily,   R     See Hyperspace for full Linked Orders Report.   06/29/22 2239   06/30/22 0230  Blood gas, arterial  ONCE - STAT,   STAT        06/30/22 0229   06/29/22 2054  Urine Culture  (Septic presentation on arrival (screening labs, nursing and treatment orders for obvious sepsis))  ONCE - URGENT,   URGENT       Question:  Indication  Answer:  Sepsis   06/29/22 2054            Vitals/Pain Today's Vitals   06/30/22 1430 06/30/22 1600 06/30/22 1630 06/30/22 1830  BP: 117/64 (!) 125/55 139/86 (!) 126/56  Pulse: 81 74 84 81  Resp: (!) 24 17 (!) 25   Temp:      TempSrc:      SpO2: 96% 95% 94% 95%  Weight:      Height:      PainSc:        Isolation Precautions No active isolations  Medications Medications  lactated ringers infusion (0 mLs Intravenous Hold 06/29/22 2116)  lactated ringers  bolus 1,000 mL (0 mLs Intravenous  Stopped 06/29/22 2148)    And  lactated ringers bolus 1,000 mL (0 mLs Intravenous Stopped 06/29/22 2148)    And  lactated ringers bolus 250 mL (0 mLs Intravenous Hold 06/29/22 2116)  heparin ADULT infusion 100 units/mL (25000 units/283m) (850 Units/hr Intravenous New Bag/Given 06/29/22 2355)  potassium chloride 10 mEq in 100 mL IVPB (10 mEq Intravenous Not Given 06/30/22 0721)  ondansetron (ZOFRAN) injection 4 mg (has no administration in time range)  predniSONE (DELTASONE) tablet 40 mg (40 mg Oral Given 06/30/22 0830)  ipratropium-albuterol (DUONEB) 0.5-2.5 (3) MG/3ML nebulizer solution 3 mL (3 mLs Nebulization Given 06/30/22 0830)  albuterol (PROVENTIL) (2.5 MG/3ML) 0.083% nebulizer solution 2.5 mg (has no administration in time range)  HYDROcodone-acetaminophen (NORCO) 10-325 MG per tablet 1 tablet (1 tablet Oral Given 06/30/22 1630)  atorvastatin (LIPITOR) tablet 80 mg (has no administration in time range)  cefTRIAXone (ROCEPHIN) 2 g in sodium chloride 0.9 % 100 mL IVPB (0 g Intravenous Stopped 06/30/22 1513)  lidocaine (LIDODERM) 5 % 1 patch (1 patch Transdermal Patch Applied 06/30/22 1631)  vitamin B-12 (CYANOCOBALAMIN) tablet 1,000 mcg (1,000 mcg Oral Given 06/30/22 1834)  fluticasone furoate-vilanterol (BREO ELLIPTA) 100-25 MCG/ACT 1 puff (has no administration in time range)    And  umeclidinium bromide (INCRUSE ELLIPTA) 62.5 MCG/ACT 1 puff (has no administration in time range)  tamsulosin (FLOMAX) capsule 0.4 mg (0.4 mg Oral Given 06/30/22 1834)  hydrALAZINE (APRESOLINE) tablet 10 mg (has no administration in time range)  acetaminophen (TYLENOL) tablet 1,000 mg (1,000 mg Oral Given 06/29/22 2109)  ceFEPIme (MAXIPIME) 2 g in sodium chloride 0.9 % 100 mL IVPB (0 g Intravenous Stopped 06/29/22 2301)  vancomycin (VANCOCIN) IVPB 1000 mg/200 mL premix (0 mg Intravenous Stopped 06/29/22 2349)  heparin bolus via infusion 4,000 Units (4,000 Units Intravenous Bolus from Bag 06/29/22 2350)   iohexol (OMNIPAQUE) 300 MG/ML solution 75 mL (75 mLs Intravenous Contrast Given 06/30/22 0115)  sodium chloride 0.9 % bolus 1,000 mL (0 mLs Intravenous Stopped 06/30/22 0822)  ketorolac (TORADOL) 15 MG/ML injection 15 mg (15 mg Intravenous Given 06/30/22 0559)  heparin bolus via infusion 2,000 Units (2,000 Units Intravenous Bolus from Bag 06/30/22 0818)  aspirin chewable tablet 81 mg (81 mg Oral Given 06/30/22 1309)    Mobility walks with device Moderate fall risk   Focused Assessments Pulmonary Assessment Handoff:  Lung sounds: Bilateral Breath Sounds: Clear, Diminished L Breath Sounds: Inspiratory wheezes R Breath Sounds: Inspiratory wheezes O2 Device: Nasal Cannula O2 Flow Rate (L/min): 3 L/min    R Recommendations: See Admitting Provider Note  Report given to:   Additional Notes: Pt does desat quickly, she is already in a progressive bed purewick in place.

## 2022-06-30 NOTE — Progress Notes (Addendum)
PROGRESS NOTE    [name]  DTO:[id number] DOB: 07/17/1951 DOA: 06/29/2022 PCP: Asencion Noble, MD     Brief Narrative:   History hypertension, hyperlipidemia, COPD on home 02, urinary retention on Flomax,  sent to ED from home via EMS due to confusion and fever. EMS gave Solu-Medrol IV magnesium DuoNebs due to wheezing,  on presentation she has fever 101.7 and tachypnea, Labs significant for leukocytosis, anemia, and elevated troponin, Cardiology was consulted and suspect demand ischemia.  Heparin infusion initiated per cardiology  CT chest and abdomen concerning for pyelonephritis.,  culture obtained, she is started on abx Patient become progressively more confused and lethargic, ABG showed pH 7.26 with Paco2  81.7, she was started on BiPAP PCCM consulted on night of admission  Subjective:  She is tolerating heparin drip She is awake off bipap, reports she could not tolerate bipap for too long due to not able to tolerate the mask Appear weak, aaox3 but still appear to be forgetful and repeat questions Denies chest pain, no edema, she is on 2liter o2 [place] Reports chronic back pain, wants home meds resumed for back pain Reports has lasix with potassium orders prn for edema for the last 27yr from pcp, but only has to use it a few times a year  Assessment & Plan:  Principal Problem:   Sepsis ([place]   Sepsis likely due to pyelonephritis on the left, there is no history of urinary retention on Flomax and followed by urology in the past,  in and out cath ordered , UA /urine culture ordered on admission, pending collection, RN notified can do in an out cath to obtain urine   Pm Addendum:  Bacteremia Blood culture positive for E. Coli, sensitivity pending, continue Rocephin, low threshold to order MRI of the spine once renal function improves due to complaint low back pain Acute urinary retention In and out cath drained 600 cc urine, patient currently is reluctant to have indwelling  Foley placement, would like to try Flomax first, continue bladder scan for now   Acute metabolic encephalopathy, from sepsis and CO2 retention, CT head no acute findings, started on bipap, improved, ordered bipap qhs, encourage patient to use at night if able to tolerate to avoid co2 retention  Acute on chronic hypoxic respiratory failure, COPD exacerbation EMS reported wheezing, no wheezing on exam today, continue scheduled nebs, prednisone  Elevation of troponin, non-STEMI versus demand ischemia , seen by cardiology recommend heparin drip, echo pending,  AKI: From sepsis pyelonephritis and dehydration, CT abdomen also showed a new fullness of the left proximal ureter Ordered bladder scan and in/out cath  HTN: hold home bp meds  (cartia xt, hyzaar) in the setting of sepsis /aki   Normocytic anemia, no overt sign of bleeding, monitor, Report take B12 at home, continue Hyponatremia, appears dehydrated, improved with hydration, start regular diet Hypokalemia, replace K, recheck in the morning  Chronic back pain, resume norco, add on topical lidocaine patch, low threshold to get mri spine once renal function improves   Other incidental findings on CT: -Chronic changes in the right lung with progressive scarring in the right lower lobe as well as calcified pleural plaques. Emphysema -7 mm left lower lobe nodule, Recommend a non-contrast Chest CT at 6-12 months, then another non-contrast Chest CT at 18-24 months -Aortic atherosclerosis -Diverticulosis without diverticulitis -Stable left adrenal lesion dating back to 2014 consistent with Adenoma .  I have Reviewed nursing notes, Vitals, pain scores, I/o's, Lab results and  imaging  results since pt's last encounter, details please see discussion above  I ordered the following labs:  Unresulted Labs (From admission, onward)     Start     Ordered   07/01/22 9381  Basic metabolic panel  Daily,   R      06/30/22 0737   07/01/22 0500   Magnesium  Tomorrow morning,   R        06/30/22 0737   07/01/22 0500  Hemoglobin A1c  Tomorrow morning,   R        06/30/22 0737   07/01/22 0500  Vitamin B12  Tomorrow morning,   R        06/30/22 0737   06/30/22 1800  Heparin level (unfractionated)  Once-Timed,   TIMED        06/30/22 0718   06/30/22 0500  Heparin level (unfractionated)  Daily,   R     See Hyperspace for full Linked Orders Report.   06/29/22 2239   06/30/22 0500  CBC  Daily,   R     See Hyperspace for full Linked Orders Report.   06/29/22 2239   06/30/22 0230  Blood gas, arterial  ONCE - STAT,   STAT        06/30/22 0229   06/29/22 2054  Urine Culture  (Septic presentation on arrival (screening labs, nursing and treatment orders for obvious sepsis))  ONCE - URGENT,   URGENT       Question:  Indication  Answer:  Sepsis   06/29/22 2054             DVT prophylaxis:   On heparin drip   Code Status:   Code Status: Full Code  Family Communication: Patient Disposition:    Dispo: The patient is from: Home              Anticipated d/c is to: Home              Anticipated d/c date is: > 48hrs  Antimicrobials:    Anti-infectives (From admission, onward)    Start     Dose/Rate Route Frequency Ordered Stop   06/30/22 1245  cefTRIAXone (ROCEPHIN) 2 g in sodium chloride 0.9 % 100 mL IVPB        2 g 200 mL/hr over 30 Minutes Intravenous Every 24 hours 06/30/22 1244     06/29/22 2145  ceFEPIme (MAXIPIME) 2 g in sodium chloride 0.9 % 100 mL IVPB        2 g 200 mL/hr over 30 Minutes Intravenous  Once 06/29/22 2140 06/29/22 2301   06/29/22 2145  vancomycin (VANCOCIN) IVPB 1000 mg/200 mL premix        1,000 mg 200 mL/hr over 60 Minutes Intravenous  Once 06/29/22 2140 06/29/22 2349          Objective: Vitals:   06/30/22 0820 06/30/22 1100 06/30/22 1200 06/30/22 1430  BP: (!) 105/49 97/69 (!) 122/54 117/64  Pulse: 77 79 79 81  Resp: 20 (!) 24 (!) 21 (!) 24  Temp: 98 F (36.7 C)     TempSrc: Oral      SpO2: 92% 93% 95% 96%  Weight:      Height:       No intake or output data in the 24 hours ending 06/30/22 1559 Filed Weights   06/29/22 2045  Weight: 72.6 kg    Examination:  General exam: weak, aaox3, still appear slightly confused  Respiratory system: adequate air movement ,no wheezing, no rales,  no rhonchi, respiratory effort normal. Cardiovascular system:  RRR.  Gastrointestinal system: Abdomen is nondistended, soft and nontender.  Normal bowel sounds heard. Central nervous system: Alert and oriented. No focal neurological deficits. Extremities:  no edema Skin: No rashes, lesions or ulcers Psychiatry: Appears slightly confused    Data Reviewed: I have personally reviewed  labs and visualized  imaging studies since the last encounter and formulate the plan        Scheduled Meds:  atorvastatin  80 mg Oral Daily   fluticasone furoate-vilanterol  1 puff Inhalation Daily   And   umeclidinium bromide  1 puff Inhalation Daily   ipratropium-albuterol  3 mL Nebulization Q6H   lidocaine  1 patch Transdermal Q24H   predniSONE  40 mg Oral Q breakfast   tamsulosin  0.4 mg Oral QPC supper   cyanocobalamin  1,000 mcg Oral Daily   Continuous Infusions:  cefTRIAXone (ROCEPHIN)  IV Stopped (06/30/22 1513)   heparin 850 Units/hr (06/29/22 2355)   lactated ringers Stopped (06/29/22 2116)   lactated ringers Stopped (06/29/22 2116)     LOS: 0 days     Florencia Reasons, MD PhD FACP [facility]  Available via Epic secure chat 7am-7pm for nonurgent issues Please page for urgent issues To page the attending provider between 7A-7P or the covering provider during after hours 7P-7A, please log into the web site [email or web address] and access using universal [place] password for that web site. If you do not have the password, please call the hospital operator.    06/30/2022, 3:59 PM

## 2022-06-30 NOTE — Progress Notes (Signed)
Critical ABG results given to C. Nevada Crane, MD at this time.

## 2022-06-30 NOTE — Progress Notes (Signed)
PHARMACY - PHYSICIAN COMMUNICATION CRITICAL VALUE ALERT - BLOOD CULTURE IDENTIFICATION (BCID)  [name] is an 71 y.o. female who presented to [facility] on 06/29/2022 with a chief complaint of confusion and fever.   Assessment:  71 year old female admitted with suspected pyelonephritis. Now with E Coli in 1/2 blood cultures.   Name of physician (or Provider) Contacted: Florencia Reasons, MD   Current antibiotics: None   Changes to prescribed antibiotics recommended:  Add ceftriaxone 2 gm IV Q 24 hours   Results for orders placed or performed during the hospital encounter of 06/29/22  Blood Culture ID Panel (Reflexed) (Collected: 06/29/2022  8:54 PM)  Result Value Ref Range   Enterococcus faecalis NOT DETECTED NOT DETECTED   Enterococcus Faecium NOT DETECTED NOT DETECTED   Listeria monocytogenes NOT DETECTED NOT DETECTED   Staphylococcus species NOT DETECTED NOT DETECTED   Staphylococcus aureus (BCID) NOT DETECTED NOT DETECTED   Staphylococcus epidermidis NOT DETECTED NOT DETECTED   Staphylococcus lugdunensis NOT DETECTED NOT DETECTED   Streptococcus species NOT DETECTED NOT DETECTED   Streptococcus agalactiae NOT DETECTED NOT DETECTED   Streptococcus pneumoniae NOT DETECTED NOT DETECTED   Streptococcus pyogenes NOT DETECTED NOT DETECTED   A.calcoaceticus-baumannii NOT DETECTED NOT DETECTED   Bacteroides fragilis NOT DETECTED NOT DETECTED   Enterobacterales DETECTED (A) NOT DETECTED   Enterobacter cloacae complex NOT DETECTED NOT DETECTED   Escherichia coli DETECTED (A) NOT DETECTED   Klebsiella aerogenes NOT DETECTED NOT DETECTED   Klebsiella oxytoca NOT DETECTED NOT DETECTED   Klebsiella pneumoniae NOT DETECTED NOT DETECTED   Proteus species NOT DETECTED NOT DETECTED   Salmonella species NOT DETECTED NOT DETECTED   Serratia marcescens NOT DETECTED NOT DETECTED   Haemophilus influenzae NOT DETECTED NOT DETECTED   Neisseria meningitidis NOT DETECTED NOT DETECTED   Pseudomonas  aeruginosa NOT DETECTED NOT DETECTED   Stenotrophomonas maltophilia NOT DETECTED NOT DETECTED   Candida albicans NOT DETECTED NOT DETECTED   Candida auris NOT DETECTED NOT DETECTED   Candida glabrata NOT DETECTED NOT DETECTED   Candida krusei NOT DETECTED NOT DETECTED   Candida parapsilosis NOT DETECTED NOT DETECTED   Candida tropicalis NOT DETECTED NOT DETECTED   Cryptococcus neoformans/gattii NOT DETECTED NOT DETECTED   CTX-M ESBL NOT DETECTED NOT DETECTED   Carbapenem resistance IMP NOT DETECTED NOT DETECTED   Carbapenem resistance KPC NOT DETECTED NOT DETECTED   Carbapenem resistance NDM NOT DETECTED NOT DETECTED   Carbapenem resist OXA 48 LIKE NOT DETECTED NOT DETECTED   Carbapenem resistance VIM NOT DETECTED NOT DETECTED   Jimmy Footman, PharmD, BCPS, BCIDP Infectious Diseases Clinical Pharmacist Phone: [phone] 06/30/2022  1:12 PM

## 2022-07-01 DIAGNOSIS — N10 Acute tubulo-interstitial nephritis: Secondary | ICD-10-CM | POA: Diagnosis not present

## 2022-07-01 LAB — MAGNESIUM: Magnesium: 2.4 mg/dL (ref 1.7–2.4)

## 2022-07-01 LAB — BASIC METABOLIC PANEL
Anion gap: 10 (ref 5–15)
BUN: 30 mg/dL — ABNORMAL HIGH (ref 8–23)
CO2: 33 mmol/L — ABNORMAL HIGH (ref 22–32)
Calcium: 8.4 mg/dL — ABNORMAL LOW (ref 8.9–10.3)
Chloride: 94 mmol/L — ABNORMAL LOW (ref 98–111)
Creatinine, Ser: 0.94 mg/dL (ref 0.44–1.00)
GFR, Estimated: >60 mL/min (ref >60)
Glucose, Bld: 155 mg/dL — ABNORMAL HIGH (ref 70–99)
Potassium: 3.7 mmol/L (ref 3.5–5.1)
Sodium: 137 mmol/L (ref 135–145)

## 2022-07-01 LAB — TROPONIN I (HIGH SENSITIVITY): Troponin I (High Sensitivity): 128 ng/L (ref <18)

## 2022-07-01 LAB — URINE CULTURE: Culture: NO GROWTH

## 2022-07-01 LAB — CBC
HCT: 30.7 % — ABNORMAL LOW (ref 36.0–46.0)
Hemoglobin: 9.4 g/dL — ABNORMAL LOW (ref 12.0–15.0)
MCH: 29.3 pg (ref 26.0–34.0)
MCHC: 30.6 g/dL (ref 30.0–36.0)
MCV: 95.6 fL (ref 80.0–100.0)
Platelets: 165 10*3/uL (ref 150–400)
RBC: 3.21 MIL/uL — ABNORMAL LOW (ref 3.87–5.11)
RDW: 12.9 % (ref 11.5–15.5)
WBC: 16.8 10*3/uL — ABNORMAL HIGH (ref 4.0–10.5)
nRBC: 0 % (ref 0.0–0.2)

## 2022-07-01 LAB — HEMOGLOBIN A1C
Hgb A1c MFr Bld: 5.4 % (ref 4.8–5.6)
Mean Plasma Glucose: 108.28 mg/dL

## 2022-07-01 LAB — HEPARIN LEVEL (UNFRACTIONATED)
Heparin Unfractionated: <0.1 IU/mL — ABNORMAL LOW (ref 0.30–0.70)
Heparin Unfractionated: 0.11 IU/mL — ABNORMAL LOW (ref 0.30–0.70)

## 2022-07-01 LAB — VITAMIN B12: Vitamin B-12: 1415 pg/mL — ABNORMAL HIGH (ref 180–914)

## 2022-07-01 MED ORDER — ENOXAPARIN SODIUM 40 MG/0.4ML IJ SOSY
40.0000 mg | PREFILLED_SYRINGE | INTRAMUSCULAR | Status: DC
  Administered 2022-07-02 – 2022-07-07 (×6): 40 mg via SUBCUTANEOUS
  Filled 2022-07-01 (×6): qty 0.4

## 2022-07-01 MED ORDER — HEPARIN BOLUS VIA INFUSION
2000.0000 [IU] | Freq: Once | INTRAVENOUS | Status: AC
  Administered 2022-07-01: 2000 [IU] via INTRAVENOUS
  Filled 2022-07-01: qty 2000

## 2022-07-01 MED ORDER — IPRATROPIUM-ALBUTEROL 0.5-2.5 (3) MG/3ML IN SOLN
3.0000 mL | Freq: Three times a day (TID) | RESPIRATORY_TRACT | Status: DC
  Administered 2022-07-01 – 2022-07-02 (×3): 3 mL via RESPIRATORY_TRACT
  Filled 2022-07-01 (×3): qty 3

## 2022-07-01 MED ORDER — LOSARTAN POTASSIUM 50 MG PO TABS
50.0000 mg | ORAL_TABLET | Freq: Every day | ORAL | Status: DC
  Administered 2022-07-01 – 2022-07-04 (×4): 50 mg via ORAL
  Filled 2022-07-01 (×4): qty 1

## 2022-07-01 MED ORDER — ENOXAPARIN SODIUM 40 MG/0.4ML IJ SOSY: 40.0000 mg | PREFILLED_SYRINGE | INTRAMUSCULAR | Status: DC

## 2022-07-01 MED ORDER — MELATONIN 5 MG PO TABS
5.0000 mg | ORAL_TABLET | Freq: Once | ORAL | Status: DC | PRN
  Filled 2022-07-01: qty 1

## 2022-07-01 NOTE — Progress Notes (Signed)
ANTICOAGULATION CONSULT NOTE - follow-up Consult  Pharmacy Consult for heparin Indication: chest pain/ACS  No Known Allergies  Patient Measurements: Height: '5\' 3"'$  (160 cm) Weight: 86.8 kg (191 lb 5.8 oz) IBW/kg (Calculated) : 52.4 Heparin Dosing Weight: 67.6kg  Vital Signs: Temp: 98.1 F (36.7 C) (07/07 0813) Temp Source: Oral (07/07 0813) BP: 133/60 (07/07 0813) Pulse Rate: 90 (07/07 0549)  Labs: Recent Labs    06/29/22 2057 06/29/22 2350 06/30/22 0251 06/30/22 0251 06/30/22 0451 06/30/22 1833 07/01/22 0207 07/01/22 0647 07/01/22 1025  HGB 10.3*  --  10.2*  --  9.5*  --  9.4*  --   --   HCT 34.3*  --  30.0*  --  31.1*  --  30.7*  --   --   PLT 160  --   --   --  146*  --  165  --   --   APTT 29  --   --   --   --   --   --   --   --   LABPROT 14.9  --   --   --   --   --   --   --   --   INR 1.2  --   --   --   --   --   --   --   --   HEPARINUNFRC  --   --   --    < > <0.10* <0.10* <0.10*  --  0.11*  CREATININE 1.29*  --   --   --  1.12*  --  0.94  --   --   TROPONINIHS 896* 1,114*  --   --   --   --   --  128*  --    < > = values in this interval not displayed.     Estimated Creatinine Clearance: 59.9 mL/min (by C-G formula based on SCr of 0.94 mg/dL).   Medical History: Past Medical History:  Diagnosis Date   Anxiety    Atrophic vaginitis 10/09/2015   Baker's cyst    left leg   Blood in urine 10/09/2015   CHF (congestive heart failure) (HCC)    COPD (chronic obstructive pulmonary disease) (HCC)    Heart murmur    Hematuria 10/09/2015   Hypertension    Nicotine addiction 04/07/2014   Pneumonia    Urinary urgency 01/25/2016   Vaginal bleeding 10/09/2015    Assessment: 59 YOF presenting with SOB, AMS, and elevated troponin. She is not on anticoagulation PTA. Chronic anemia stable. Pharmacy consulted to start IV heparin for ACS rule out with concern for NSTEMI.   Heparin level: 0.11- subtherapeutic.   Narrative:  No issues with bleeding. Spoke  with the nurse at bedside. She stated the infusion has been mostly off this morning d/t non-patent IV line. IV team was contact and placed a new line which is patent and the infusion has been running without issues since 10:15 AM.   Goal of Therapy:  Heparin level 0.3-0.7 units/ml Monitor platelets by anticoagulation protocol: Yes   Plan:  Continue the heparin infusion at 1200 units/hr Re-assess a heparin level at 17:30 today Heparin level daily while on heparin infusion Daily CBC, follow for signs of bleeding.   Vaughan Basta BS, PharmD, BCPS Clinical Pharmacist 07/01/2022 11:27 AM  Contact: [phone] after 3 PM  "Be curious, not judgmental..." -Jamal Maes

## 2022-07-01 NOTE — Progress Notes (Signed)
Dr. Marlowe Sax paged to notify of Pts. Bladder scan: 885 mL.

## 2022-07-01 NOTE — Progress Notes (Signed)
Pt. Requesting something for sleep. Dr. Marlowe Sax paged for orders.

## 2022-07-01 NOTE — Progress Notes (Signed)
Progress Note  Patient Name: [name] Date of Encounter: 07/01/2022  [facility] Cardiologist: None   Subjective  Pt a little SOB  Has some epigastric discomfort   Says her bladder is full, that she needs to be straight cathed.    Inpatient Medications    Scheduled Meds:  atorvastatin  80 mg Oral Daily   fluticasone furoate-vilanterol  1 puff Inhalation Daily   And   umeclidinium bromide  1 puff Inhalation Daily   ipratropium-albuterol  3 mL Nebulization Q6H   lidocaine  1 patch Transdermal Q24H   predniSONE  40 mg Oral Q breakfast   tamsulosin  0.4 mg Oral QPC supper   cyanocobalamin  1,000 mcg Oral Daily   Continuous Infusions:  cefTRIAXone (ROCEPHIN)  IV Stopped (06/30/22 1513)   heparin 1,200 Units/hr (07/01/22 0427)   lactated ringers Stopped (06/29/22 2116)   PRN Meds: albuterol, hydrALAZINE, HYDROcodone-acetaminophen, ondansetron (ZOFRAN) IV   Vital Signs    Vitals:   07/01/22 0128 07/01/22 0214 07/01/22 0311 07/01/22 0549  BP:   (!) 108/50   Pulse:   70 90  Resp:   10   Temp:   98.2 F (36.8 C)   TempSrc:   Axillary   SpO2: 92%  90% 95%  Weight:  86.8 kg    Height:        Intake/Output Summary (Last 24 hours) at 07/01/2022 0630 Last data filed at 07/01/2022 0600 Gross per 24 hour  Intake 100 ml  Output 1200 ml  Net -1100 ml      07/01/2022    2:14 AM 06/30/2022    8:37 PM 06/29/2022    8:45 PM  Last 3 Weights  Weight (lbs) 191 lb 5.8 oz 191 lb 5.8 oz 160 lb 0.9 oz  Weight (kg) 86.8 kg 86.8 kg 72.6 kg      Telemetry    SR  - Personally Reviewed  ECG    No new ECG tracing today. - Personally Reviewed  Physical Exam   GEN: Caucasian female in no acute distress.   Neck: No JVD. Cardiac: RRR. Soft II/VI systolic murmur noted Respiratory: CTA GI: Soft, non-distended, and non-tender. MS: No lower extremity edema. No deformity. Neuro:  Alert and oriented x3. No focal deficits. Psych: Normal affect.  Labs    High Sensitivity  Troponin:   Recent Labs  Lab 06/29/22 2057 06/29/22 2350  TROPONINIHS 896* 1,114*     Chemistry Recent Labs  Lab 06/29/22 2057 06/30/22 0251 06/30/22 0451 07/01/22 0207  NA 131* 128* 133* 137  K 3.4* 3.2* 3.6 3.7  CL 85*  --  91* 94*  CO2 33*  --  32 33*  GLUCOSE 152*  --  212* 155*  BUN 26*  --  25* 30*  CREATININE 1.29*  --  1.12* 0.94  CALCIUM 8.2*  --  7.8* 8.4*  MG  --   --  2.3 2.4  PROT 6.4*  --  5.8*  --   ALBUMIN 3.2*  --  2.6*  --   AST 24  --  21  --   ALT 16  --  15  --   ALKPHOS 65  --  54  --   BILITOT 0.4  --  0.4  --   GFRNONAA 45*  --  54* >60  ANIONGAP 13  --  10 10    Lipids  Recent Labs  Lab 06/30/22 0451  CHOL 154  TRIG 98  HDL 50  LDLCALC 84  CHOLHDL 3.1    Hematology Recent Labs  Lab 06/29/22 2057 06/30/22 0251 06/30/22 0451 07/01/22 0207  WBC 22.2*  --  16.5* 16.8*  RBC 3.53*  --  3.22* 3.21*  HGB 10.3* 10.2* 9.5* 9.4*  HCT 34.3* 30.0* 31.1* 30.7*  MCV 97.2  --  96.6 95.6  MCH 29.2  --  29.5 29.3  MCHC 30.0  --  30.5 30.6  RDW 12.8  --  12.9 12.9  PLT 160  --  146* 165   Thyroid No results for input(s): "TSH", "FREET4" in the last 168 hours.  BNPNo results for input(s): "BNP", "PROBNP" in the last 168 hours.  DDimer No results for input(s): "DDIMER" in the last 168 hours.   Radiology    ECHOCARDIOGRAM COMPLETE  Result Date: 06/30/2022    ECHOCARDIOGRAM REPORT   Patient Name:   [name] Date of Exam: 06/30/2022 Medical Rec #:  [id number]        Height:       63.0 in Accession #:    [id number]       Weight:       160.1 lb Date of Birth:  Jun 09, 1951        BSA:          1.759 m Patient Age:    71 years         BP:           116/57 mmHg Patient Gender: F                HR:           84 bpm. Exam Location:  Inpatient Procedure: 2D Echo, Cardiac Doppler and Color Doppler Indications:    Elevated Troponin  History:        Patient has no prior history of Echocardiogram examinations.                 CHF, COPD; Risk  Factors:Hypertension.  Sonographer:    Bernadene Person RDCS Referring Phys: [id number] [place]  1. Left ventricular ejection fraction, by estimation, is 55 to 60%. The left ventricle has normal function. The left ventricle has no regional wall motion abnormalities. Left ventricular diastolic parameters are consistent with Grade I diastolic dysfunction (impaired relaxation).  2. Right ventricular systolic function is normal. The right ventricular size is moderately enlarged. There is mildly elevated pulmonary artery systolic pressure. The estimated right ventricular systolic pressure is 59.1 mmHg.  3. Left atrial size was mildly dilated.  4. The mitral valve is normal in structure. No evidence of mitral valve regurgitation. No evidence of mitral stenosis.  5. Tricuspid valve regurgitation is moderate.  6. The aortic valve is normal in structure. Aortic valve regurgitation is not visualized. No aortic stenosis is present.  7. The inferior vena cava is normal in size with greater than 50% respiratory variability, suggesting right atrial pressure of 3 mmHg. FINDINGS  Left Ventricle: Left ventricular ejection fraction, by estimation, is 55 to 60%. The left ventricle has normal function. The left ventricle has no regional wall motion abnormalities. The left ventricular internal cavity size was normal in size. There is  no left ventricular hypertrophy. Left ventricular diastolic parameters are consistent with Grade I diastolic dysfunction (impaired relaxation). Right Ventricle: The right ventricular size is moderately enlarged. No increase in right ventricular wall thickness. Right ventricular systolic function is normal. There is mildly elevated pulmonary artery systolic pressure. The tricuspid regurgitant velocity is 3.22 m/s, and  with an assumed right atrial pressure of 3 mmHg, the estimated right ventricular systolic pressure is 61.4 mmHg. Left Atrium: Left atrial size was mildly dilated. Right Atrium:  Right atrial size was normal in size. Pericardium: There is no evidence of pericardial effusion. Mitral Valve: The mitral valve is normal in structure. There is mild thickening of the mitral valve leaflet(s). There is mild calcification of the mitral valve leaflet(s). Mild mitral annular calcification. No evidence of mitral valve regurgitation. No evidence of mitral valve stenosis. Tricuspid Valve: The tricuspid valve is normal in structure. Tricuspid valve regurgitation is moderate . No evidence of tricuspid stenosis. Aortic Valve: The aortic valve is normal in structure. Aortic valve regurgitation is not visualized. No aortic stenosis is present. Pulmonic Valve: The pulmonic valve was normal in structure. Pulmonic valve regurgitation is not visualized. No evidence of pulmonic stenosis. Aorta: The aortic root is normal in size and structure. Venous: The inferior vena cava is normal in size with greater than 50% respiratory variability, suggesting right atrial pressure of 3 mmHg. IAS/Shunts: No atrial level shunt detected by color flow Doppler.  LEFT VENTRICLE PLAX 2D LVIDd:         5.00 cm      Diastology LVIDs:         3.10 cm      LV e' medial:    5.33 cm/s LV PW:         0.80 cm      LV E/e' medial:  23.1 LV IVS:        0.90 cm      LV e' lateral:   5.85 cm/s LVOT diam:     1.80 cm      LV E/e' lateral: 21.0 LV SV:         55 LV SV Index:   31 LVOT Area:     2.54 cm  LV Volumes (MOD) LV vol d, MOD A2C: 120.0 ml LV vol d, MOD A4C: 67.1 ml LV vol s, MOD A2C: 56.5 ml LV vol s, MOD A4C: 23.8 ml LV SV MOD A2C:     63.5 ml LV SV MOD A4C:     67.1 ml LV SV MOD BP:      59.7 ml RIGHT VENTRICLE RV S prime:     10.70 cm/s TAPSE (M-mode): 2.6 cm LEFT ATRIUM             Index        RIGHT ATRIUM           Index LA diam:        6.20 cm 3.52 cm/m   RA Area:     15.90 cm LA Vol (A2C):   64.5 ml 36.67 ml/m  RA Volume:   52.10 ml  29.62 ml/m LA Vol (A4C):   61.0 ml 34.68 ml/m LA Biplane Vol: 66.7 ml 37.92 ml/m  AORTIC  VALVE LVOT Vmax:   107.00 cm/s LVOT Vmean:  70.600 cm/s LVOT VTI:    0.215 m  AORTA Ao Root diam: 2.90 cm Ao Asc diam:  2.90 cm MITRAL VALVE                TRICUSPID VALVE MV Area (PHT): 4.06 cm     TR Peak grad:   41.5 mmHg MV Decel Time: 187 msec     TR Vmax:        322.00 cm/s MV E velocity: 123.00 cm/s MV A velocity: 127.00 cm/s  SHUNTS MV E/A ratio:  0.97  Systemic VTI:  0.22 m                             Systemic Diam: 1.80 cm Candee Furbish MD Electronically signed by Candee Furbish MD Signature Date/Time: 06/30/2022/12:03:16 PM    Final    CT HEAD WO CONTRAST (5MM)  Result Date: 06/30/2022 CLINICAL DATA:  Delirium. EXAM: CT HEAD WITHOUT CONTRAST TECHNIQUE: Contiguous axial images were obtained from the base of the skull through the vertex without intravenous contrast. RADIATION DOSE REDUCTION: This exam was performed according to the departmental dose-optimization program which includes automated exposure control, adjustment of the mA and/or kV according to patient size and/or use of iterative reconstruction technique. COMPARISON:  None Available. FINDINGS: Brain: No evidence of acute infarction, hemorrhage, hydrocephalus, extra-axial collection or mass lesion/mass effect. Vascular: No hyperdense vessel or unexpected calcification. Skull: Normal. Negative for fracture or focal lesion. Sinuses/Orbits: No acute finding. Other: None. IMPRESSION: No acute intracranial pathology. Electronically Signed   By: Virgina Norfolk M.D.   On: 06/30/2022 03:34   CT CHEST ABDOMEN PELVIS W CONTRAST  Result Date: 06/30/2022 CLINICAL DATA:  Possible sepsis EXAM: CT CHEST, ABDOMEN, AND PELVIS WITH CONTRAST TECHNIQUE: Multidetector CT imaging of the chest, abdomen and pelvis was performed following the standard protocol during bolus administration of intravenous contrast. RADIATION DOSE REDUCTION: This exam was performed according to the departmental dose-optimization program which includes automated exposure control,  adjustment of the mA and/or kV according to patient size and/or use of iterative reconstruction technique. CONTRAST:  71m OMNIPAQUE IOHEXOL 300 MG/ML  SOLN COMPARISON:  Chest x-ray from earlier in the same day, CT from 07/25/2018, 06/25/2020, 03/20/2013 FINDINGS: CT CHEST FINDINGS Cardiovascular: Atherosclerotic calcifications of the aorta are noted without aneurysmal dilatation. The heart is at the upper limits of normal in size. Pulmonary artery shows no central pulmonary embolus although not timed for embolus evaluation. Mediastinum/Nodes: Thoracic inlet is within normal limits. No hilar or mediastinal adenopathy is noted. The esophagus is within normal limits. Lungs/Pleura: Left lung is hyperinflated with emphysematous change. Mild left basilar atelectatic changes are seen. 7 mm nodule is noted in the left lower lobe best seen on image number 86 of series 5. Chronic scarring is noted in the right lung base and slightly progressed when compared with the prior CT from 2019. Calcified pleural plaques are noted. No acute infiltrate or sizable effusion is noted. Emphysematous changes are noted within the right lung as well. Musculoskeletal: Degenerative changes of the thoracic spine are seen. No acute rib abnormality is noted. CT ABDOMEN PELVIS FINDINGS Hepatobiliary: No focal liver abnormality is seen. No gallstones, gallbladder wall thickening, or biliary dilatation. Pancreas: Unremarkable. No pancreatic ductal dilatation or surrounding inflammatory changes. Spleen: Normal in size without focal abnormality. Adrenals/Urinary Tract: Adrenal glands are well visualized with a stable 16 mm nodule in the left adrenal similar to that seen on prior exam (2014) consistent with adenoma. Kidneys are well visualized bilaterally. The left kidney is mildly enlarged with some patchy decreased enhancement identified likely related to pyelonephritis. The right kidney shows a normal enhancement pattern. No obstructive changes are  seen. The bladder is within normal limits. Stomach/Bowel: Scattered diverticular change of the colon is noted. No diverticulitis is seen. The appendix is within normal limits. Small bowel and stomach are unremarkable. Vascular/Lymphatic: Aortic atherosclerosis. No enlarged abdominal or pelvic lymph nodes. Reproductive: Status post hysterectomy. No adnexal masses. Other: No abdominal wall hernia or abnormality. No abdominopelvic ascites.  Musculoskeletal: No acute bony abnormality is noted. IMPRESSION: CT of the chest: Chronic changes in the right lung with progressive scarring in the right lower lobe as well as calcified pleural plaques. 7 mm left lower lobe nodule. Recommend a non-contrast Chest CT at 6-12 months, then another non-contrast Chest CT at 18-24 months. These guidelines do not apply to immunocompromised patients and patients with cancer. Follow up in patients with significant comorbidities as clinically warranted. For lung cancer screening, adhere to Lung-RADS guidelines. Reference: Radiology. 2017; 284(1):228-43. CT of the abdomen and pelvis: Patchy enhancement in the left kidney consistent with pyelonephritis. Minimal fullness of the proximal ureter is seen although no obstructing lesion is noted. Diverticulosis without diverticulitis. Stable left adrenal lesion dating back to 2014 consistent with adenoma. Aortic Atherosclerosis (ICD10-I70.0) and Emphysema (ICD10-J43.9). Electronically Signed   By: Inez Catalina M.D.   On: 06/30/2022 01:25   DG Chest Port 1 View  Result Date: 06/29/2022 CLINICAL DATA:  Questionable sepsis - evaluate for abnormality EXAM: PORTABLE CHEST 1 VIEW COMPARISON:  Chest radiograph 08/15/2021.  CT 07/25/2018 FINDINGS: Chronic volume loss in the right hemithorax with scarring at the right lung base. Mild cardiomegaly with stable mediastinal contours. Chronic bronchial thickening, emphysema on prior CT. Streaky atelectasis at the left lung base. No pneumothorax or pleural  effusion. No acute osseous abnormalities. IMPRESSION: 1. Chronic volume loss in the right hemithorax with scarring at the right lung base. 2. Stable mild cardiomegaly. Streaky atelectasis at the left lung base. Electronically Signed   By: Keith Rake M.D.   On: 06/29/2022 21:18    Cardiac Studies   Echo  07/01/22   1. Left ventricular ejection fraction, by estimation, is 55 to 60%. The  left ventricle has normal function. The left ventricle has no regional  wall motion abnormalities. Left ventricular diastolic parameters are  consistent with Grade I diastolic  dysfunction (impaired relaxation).   2. Right ventricular systolic function is normal. The right ventricular  size is moderately enlarged. There is mildly elevated pulmonary artery  systolic pressure. The estimated right ventricular systolic pressure is  17.4 mmHg.   3. Left atrial size was mildly dilated.   4. The mitral valve is normal in structure. No evidence of mitral valve  regurgitation. No evidence of mitral stenosis.   5. Tricuspid valve regurgitation is moderate.   6. The aortic valve is normal in structure. Aortic valve regurgitation is  not visualized. No aortic stenosis is present.   7. The inferior vena cava is normal in size with greater than 50%  respiratory variability, suggesting right atrial pressure of 3 mmHg.   Patient Profile     71 y.o. female with a history of COPD, hypertension, hyperlipidemia, chronic pain syndrome and anxiety who was admitted on 06/29/2022 with severe sepsis secondary to pyelonephritis after presenting with fever and altered mental status. Cardiology was consulted for further evaluation of elevated troponin.  Assessment & Plan    Elevated Troponin Pt denied and continues to deny chest pain  Today she points to upper abdomen, epigaastric rgeion     Troponins yesterday  896, 1114   Today is 128   Echo  today as noted above  LVEF normal without regional changes  Will stop heparin IV  today  Give SQ LOvenox for DVT Continue aspirin and high-intensity statin.  Troponin elevation may just reflect demand in setting of severe infection   Given risk factors (HTN, HL, tob, FHx) would schedule adenosine myoview closer to discharge  to  rule out ischemia  I do not think epigastric discomfort is cardiac   She is having generallized abdominal discomfort from bladder   Hypertension BP is increased from yesterday   Will add losartan 50   She was on losartan /HCTZ 100/25 and cartia 180 bid at home   Follow     Hyperlipidemia Lipid panel this admission: Total Cholesterol 154, Triglycerides 98, HDL 50, LDL 84. LDL goal <70 given atherosclerotic calcification of aorta noted on CT this admission.  - On Lipitor '40mg'$  daily at home. Will increase to '80mg'$  daily. - Will need repeat lipid panel and LFTs in 6-8 weeks.  AKI Creatinine 1.29 on admission. Baseline around 0.4. Felt to be due to dehydration. - Slowly improving after fluids. Creatinine 1.12 today. - Continue to hold Losartan-HCTZ. - Management per primary team.  Hypokalemia Potassium as low as 3.2 this admission.  It is 3.7 today    ID    Pt being treated for urosepsis     For questions or updates, please contact [facility] Please consult [email or web address] for contact info under        Signed, Dorris Carnes, MD  07/01/2022, 6:30 AM    Patient seen and examined   I agree with findings as noted above by Virgie Dad PT is a 58 yo with severe COPD (on home O2)  Brought by EMS to ED for evaluation of confusion and fever.   Treatead with nebulizes,Solumedrol and ABX   T 101.7    WBC increased.    Troponin elevated at 896 then 1114   Blood cult + ecoli    The pt denies CP   She said she had been feeling back for days prior to admission.   SOB   Says she is some  breathing better  now     On exam,  Neck  JVP is normal  Lungs   Moving air   No rales or wheezes Cardiac exam   RRR   NO S3   No significant murmurs Abd is supple    No hepatomegaly Ext are without edema  Warm feet    EKG without acute changes   ST 117 bpm  Occasional PVCs    I have reviewed echo   LVEF is normal with normal wall motion RV appear mild to moderately dilated   Overall RVEF normal     Impression  Troponin elevation may be due to demand in the setting of sepsis.    No hx of CP but activity is limited because of COPD  RV dilation may be due to long hx COPD   She was hypoxic on admit.   On O2 at home     Note CT not adequate to exclude PE      REcomm:    Currently heparin for now    Would get another  troponin to see if trend is improving now  D dimer would be unreliable given sepsis  If troponin trending down, would sched a myoview prior to d/c to evaluate for ischemia    Other recommendatiions as noted above  Dorris Carnes MD

## 2022-07-01 NOTE — Progress Notes (Signed)
PROGRESS NOTE    [name]  HLK:[id number] DOB: Jun 22, 1951 DOA: 06/29/2022 PCP: Celene Squibb, MD     Brief Narrative:   History hypertension, hyperlipidemia, COPD on home 02, urinary retention on Flomax,  sent to ED from home via EMS due to confusion and fever. EMS gave Solu-Medrol IV magnesium DuoNebs due to wheezing,  on presentation she has fever 101.7 and tachypnea, Labs significant for leukocytosis, anemia, and elevated troponin, Cardiology was consulted and suspect demand ischemia.  Heparin infusion initiated per cardiology  CT chest and abdomen concerning for pyelonephritis.,  culture obtained, she is started on abx Patient become progressively more confused and lethargic, ABG showed pH 7.26 with Paco2  81.7, she was started on BiPAP PCCM consulted on night of admission  Subjective:  She is tolerating heparin drip She reports able to tolerate bipap last night, she agreed to use it tonight,   Has urinary retention again last night , 800cc drained reported after in an out cath, feeling bladder is full again, not able to empty, she now agrees to foley placement  RN reports patient oxygen dropped to the 80% when gets up  Reports back pain, wants to get up, think back pain will get better if gets up   Appear weak, aaox3 but reports not back to baseline as she could not remember phone numbers   Denies chest pain, no edema, she is on 2liter o2 [place] which is her baseline  Reports has lasix with potassium orders prn for edema for the last 75yr from pcp, but only has to use it a few times a year  Assessment & Plan:  Principal Problem:   Sepsis ([place] Active Problems:   Troponin level elevated   Acute pyelonephritis   E coli bacteremia   Acute metabolic encephalopathy   Acute on chronic respiratory failure with hypoxia and hypercapnia (HCC)   Sepsis secondary to UTI ([place]   Sepsis likely due to pyelonephritis on the left  Ecoli Bacteremia Blood culture positive for E.  Coli, sensitivity pending, continue Rocephin, plan to order MRI L spine  wwo contrast  once renal function improves due to complaint low back pain  Acute urinary retention On flomax at home Drained 1.2 liter after in and out cath on 7/7 am Insert foley    Acute metabolic encephalopathy, from sepsis and CO2 retention, CT head no acute findings,  Improved on bipap, agreed to bipap qhs   Acute on chronic hypoxic respiratory failure, COPD exacerbation EMS reported wheezing, no wheezing on exam today, continue scheduled nebs, prednisone Consider CTA to rule out PE once cr improves  Elevation of troponin, non-STEMI versus demand ischemia , Heparin drip discontinued on 7/7 per cards  echo preserved LVEF, no wall motion abnormalities, grade 1 diastolic dysfunction,The right ventricular size is moderately enlarged. No  increase in right ventricular wall thickness. Right ventricular systolic  function is normal. There is mildly elevated pulmonary artery systolic  pressure. Will follow cards recommendation   AKI: From sepsis, pyelonephritis , urinary retention  CT abdomen also showed a new fullness of the left proximal ureter Cr improving   HTN:  Presents with sepsis/hypotension  home bp meds cartia xt, hyzaar held in the setting of sepsis /aki Bp /cr improving, started  back on low dose losartan per cards   Normocytic anemia, no overt sign of bleeding, monitor, Report take B12 at home, continue Hyponatremia, resolved after hydration, encourage oral intake Hypokalemia, replace K, improved, recheck in the morning  Chronic  back pain, resume norco, add on topical lidocaine patch, low threshold to get mri spine once renal function improves   Other incidental findings on CT: -Chronic changes in the right lung with progressive scarring in the right lower lobe as well as calcified pleural plaques. Emphysema -7 mm left lower lobe nodule, Recommend a non-contrast Chest CT at 6-12 months, then  another non-contrast Chest CT at 18-24 months -Aortic atherosclerosis -Diverticulosis without diverticulitis -Stable left adrenal lesion dating back to 2014 consistent with Adenoma .  I have Reviewed nursing notes, Vitals, pain scores, I/o's, Lab results and  imaging results since pt's last encounter, details please see discussion above  I ordered the following labs:  Unresulted Labs (From admission, onward)     Start     Ordered   07/02/22 0500  CBC with Differential/Platelet  Tomorrow morning,   R        07/01/22 1811   07/01/22 4034  Basic metabolic panel  Daily,   R      06/30/22 0737             DVT prophylaxis: enoxaparin (LOVENOX) injection 40 mg Start: 07/02/22 1000  On heparin drip   Code Status:   Code Status: Full Code  Family Communication: Patient Disposition:    Dispo: The patient is from: Home              Anticipated d/c is to: Home              Anticipated d/c date is: > 48hrs, plan for CTA chest and MRI l spine once cr improves, needs cards clearance, cards plan for stress test prior to discharge  Antimicrobials:    Anti-infectives (From admission, onward)    Start     Dose/Rate Route Frequency Ordered Stop   06/30/22 1245  cefTRIAXone (ROCEPHIN) 2 g in sodium chloride 0.9 % 100 mL IVPB        2 g 200 mL/hr over 30 Minutes Intravenous Every 24 hours 06/30/22 1244     06/29/22 2145  ceFEPIme (MAXIPIME) 2 g in sodium chloride 0.9 % 100 mL IVPB        2 g 200 mL/hr over 30 Minutes Intravenous  Once 06/29/22 2140 06/29/22 2301   06/29/22 2145  vancomycin (VANCOCIN) IVPB 1000 mg/200 mL premix        1,000 mg 200 mL/hr over 60 Minutes Intravenous  Once 06/29/22 2140 06/29/22 2349          Objective: Vitals:   07/01/22 1128 07/01/22 1218 07/01/22 1500 07/01/22 1624  BP:  135/73    Pulse:  100    Resp:    20  Temp:  97.6 F (36.4 C)  98.2 F (36.8 C)  TempSrc:  Oral  Oral  SpO2: 94%  97%   Weight:      Height:        Intake/Output  Summary (Last 24 hours) at 07/01/2022 1823 Last data filed at 07/01/2022 1256 Gross per 24 hour  Intake 100 ml  Output 1900 ml  Net -1800 ml   Filed Weights   06/29/22 2045 06/30/22 2037 07/01/22 0214  Weight: 72.6 kg 86.8 kg 86.8 kg    Examination:  General exam: weak, aaox3, still appear slightly confused  Respiratory system: adequate air movement ,no wheezing, no rales, no rhonchi, respiratory effort normal. Cardiovascular system:  RRR.  Gastrointestinal system: Abdomen is nondistended, soft and nontender.  Normal bowel sounds heard. Central nervous system: Alert and oriented. No  focal neurological deficits. Extremities:  no edema Skin: No rashes, lesions or ulcers Psychiatry: Appears slightly confused    Data Reviewed: I have personally reviewed  labs and visualized  imaging studies since the last encounter and formulate the plan        Scheduled Meds:  atorvastatin  80 mg Oral Daily   [START ON 07/02/2022] enoxaparin (LOVENOX) injection  40 mg Subcutaneous Q24H   fluticasone furoate-vilanterol  1 puff Inhalation Daily   And   umeclidinium bromide  1 puff Inhalation Daily   ipratropium-albuterol  3 mL Nebulization TID   lidocaine  1 patch Transdermal Q24H   losartan  50 mg Oral Daily   predniSONE  40 mg Oral Q breakfast   tamsulosin  0.4 mg Oral QPC supper   cyanocobalamin  1,000 mcg Oral Daily   Continuous Infusions:  cefTRIAXone (ROCEPHIN)  IV 2 g (07/01/22 1253)   lactated ringers Stopped (06/29/22 2116)     LOS: 1 day     Florencia Reasons, MD PhD FACP [facility]  Available via Epic secure chat 7am-7pm for nonurgent issues Please page for urgent issues To page the attending provider between 7A-7P or the covering provider during after hours 7P-7A, please log into the web site [email or web address] and access using universal [place] password for that web site. If you do not have the password, please call the hospital operator.    07/01/2022, 6:23 PM

## 2022-07-01 NOTE — Progress Notes (Signed)
Paged Dr. Marlowe Sax again for sleep aid for patient.

## 2022-07-02 ENCOUNTER — Inpatient Hospital Stay (HOSPITAL_COMMUNITY): Payer: Medicare Other

## 2022-07-02 DIAGNOSIS — N10 Acute tubulo-interstitial nephritis: Secondary | ICD-10-CM | POA: Diagnosis not present

## 2022-07-02 LAB — CULTURE, BLOOD (ROUTINE X 2)
Special Requests: ADEQUATE
Special Requests: ADEQUATE

## 2022-07-02 LAB — CBC WITH DIFFERENTIAL/PLATELET
Abs Immature Granulocytes: 0.14 10*3/uL — ABNORMAL HIGH (ref 0.00–0.07)
Basophils Absolute: 0 10*3/uL (ref 0.0–0.1)
Basophils Relative: 0 %
Eosinophils Absolute: 0 10*3/uL (ref 0.0–0.5)
Eosinophils Relative: 0 %
HCT: 30 % — ABNORMAL LOW (ref 36.0–46.0)
Hemoglobin: 8.8 g/dL — ABNORMAL LOW (ref 12.0–15.0)
Immature Granulocytes: 1 %
Lymphocytes Relative: 4 %
Lymphs Abs: 0.6 10*3/uL — ABNORMAL LOW (ref 0.7–4.0)
MCH: 28.7 pg (ref 26.0–34.0)
MCHC: 29.3 g/dL — ABNORMAL LOW (ref 30.0–36.0)
MCV: 97.7 fL (ref 80.0–100.0)
Monocytes Absolute: 1.1 10*3/uL — ABNORMAL HIGH (ref 0.1–1.0)
Monocytes Relative: 9 %
Neutro Abs: 10.8 10*3/uL — ABNORMAL HIGH (ref 1.7–7.7)
Neutrophils Relative %: 86 %
Platelets: 180 10*3/uL (ref 150–400)
RBC: 3.07 MIL/uL — ABNORMAL LOW (ref 3.87–5.11)
RDW: 12.9 % (ref 11.5–15.5)
WBC: 12.6 10*3/uL — ABNORMAL HIGH (ref 4.0–10.5)
nRBC: 0.2 % (ref 0.0–0.2)

## 2022-07-02 LAB — BASIC METABOLIC PANEL
Anion gap: 9 (ref 5–15)
BUN: 23 mg/dL (ref 8–23)
CO2: 35 mmol/L — ABNORMAL HIGH (ref 22–32)
Calcium: 8.5 mg/dL — ABNORMAL LOW (ref 8.9–10.3)
Chloride: 96 mmol/L — ABNORMAL LOW (ref 98–111)
Creatinine, Ser: 0.83 mg/dL (ref 0.44–1.00)
GFR, Estimated: >60 mL/min (ref >60)
Glucose, Bld: 129 mg/dL — ABNORMAL HIGH (ref 70–99)
Potassium: 4.1 mmol/L (ref 3.5–5.1)
Sodium: 140 mmol/L (ref 135–145)

## 2022-07-02 MED ORDER — SENNOSIDES-DOCUSATE SODIUM 8.6-50 MG PO TABS
1.0000 | ORAL_TABLET | Freq: Every day | ORAL | Status: DC
  Administered 2022-07-02: 1 via ORAL
  Filled 2022-07-02 (×3): qty 1

## 2022-07-02 MED ORDER — IPRATROPIUM-ALBUTEROL 0.5-2.5 (3) MG/3ML IN SOLN
3.0000 mL | Freq: Two times a day (BID) | RESPIRATORY_TRACT | Status: DC
  Administered 2022-07-02 – 2022-07-03 (×3): 3 mL via RESPIRATORY_TRACT
  Filled 2022-07-02 (×3): qty 3

## 2022-07-02 MED ORDER — IOHEXOL 350 MG/ML SOLN
125.0000 mL | Freq: Once | INTRAVENOUS | Status: AC | PRN
  Administered 2022-07-02: 125 mL via INTRAVENOUS

## 2022-07-02 MED ORDER — DILTIAZEM HCL 60 MG PO TABS
30.0000 mg | ORAL_TABLET | Freq: Two times a day (BID) | ORAL | Status: DC
  Administered 2022-07-02 – 2022-07-03 (×3): 30 mg via ORAL
  Filled 2022-07-02 (×3): qty 1

## 2022-07-02 MED ORDER — POLYETHYLENE GLYCOL 3350 17 G PO PACK
17.0000 g | PACK | Freq: Every day | ORAL | Status: DC
  Administered 2022-07-03: 17 g via ORAL
  Filled 2022-07-02 (×3): qty 1

## 2022-07-02 MED ORDER — CHLORHEXIDINE GLUCONATE CLOTH 2 % EX PADS
6.0000 | MEDICATED_PAD | Freq: Every day | CUTANEOUS | Status: DC
  Administered 2022-07-02 – 2022-07-07 (×6): 6 via TOPICAL

## 2022-07-02 NOTE — Progress Notes (Signed)
Patient placed on BIPAP for the night.  Tolerating well at this time.  RT will continue to monitor.

## 2022-07-02 NOTE — Progress Notes (Signed)
PROGRESS NOTE    [name]  EVO:[id number] DOB: 04-04-1951 DOA: 06/29/2022 PCP: Celene Squibb, MD     Brief Narrative:   History hypertension, hyperlipidemia, COPD on home 02, urinary retention on Flomax,  sent to ED from home via EMS due to confusion and fever. EMS gave Solu-Medrol IV magnesium DuoNebs due to wheezing,  on presentation she has fever 101.7 and tachypnea, Labs significant for leukocytosis, anemia, and elevated troponin, Cardiology was consulted and suspect demand ischemia.  Heparin infusion initiated per cardiology  CT chest and abdomen concerning for pyelonephritis.,  culture obtained, she is started on abx Patient become progressively more confused and lethargic, ABG showed pH 7.26 with Paco2  81.7, she was started on BiPAP PCCM consulted on night of admission  Subjective:  Reports did tolerate bipap last night for about 4hrs,  Feeling tired today No fever, confusion appear has resolved, she is anxious   She reports  oxygen dropped to the 80% when gets ups, Denies chest pain, no edema, she is on 2liter o2 [place] which is her baseline She wants to get up to sit in chair, reports back pain is better sitting in chair Does not appear to have weakness in legs Foley in place with clear urine Reports stomach discomfort, reports no bm for several days,   Daughter at bedside   Assessment & Plan:  Principal Problem:   Sepsis ([place]) Active Problems:   Troponin level elevated   Acute pyelonephritis   E coli bacteremia   Acute metabolic encephalopathy   Acute on chronic respiratory failure with hypoxia and hypercapnia (HCC)   Sepsis secondary to UTI ([place])   Sepsis likely due to pyelonephritis on the left  Ecoli Bacteremia Blood culture positive for pansensitive E. Coli, continue Rocephin for now,  now  renal function has improved, plan to order MRI L spine  wwo contrast  ( low back pain) , she does not want done today, but she is going to think about it  Acute  urinary retention On flomax at home Drained 1.2 liter after in and out cath on 7/7 am foley inserted on 7/7 Need voiding trial before discharge    Acute metabolic encephalopathy, from sepsis and CO2 retention, CT head no acute findings,  Improved on bipap, agreed to bipap qhs   Acute on chronic hypoxic respiratory failure, COPD exacerbation EMS reported wheezing, no wheezing on exam today,  CTA No PE showed ill-defined ground-glass nodularity, small amount of secretions in the trachea, possible aspiration, chronic scaring , erythema.  calcified pleural plaques.Lung nodules, , will discuss with pulmonology already on antibiotics, continue scheduled nebs, prednisone  Elevation of troponin, non-STEMI versus demand ischemia , -Was initially on Heparin drip,  discontinued on 7/7 per cards  -echo preserved LVEF, no wall motion abnormalities, grade 1 diastolic dysfunction,The right ventricular size is moderately enlarged. No  increase in right ventricular wall thickness. Right ventricular systolic function is normal. There is mildly elevated pulmonary artery systolic  pressure. -Reports has lasix with potassium orders prn for edema for the last 20yr from pcp, but only has to use it a few times a year -Currently does not appear volume overloaded  -Will follow cards recommendation   AKI: From sepsis, pyelonephritis , urinary retention Cr improving /normalized  HTN:  Presents with sepsis/hypotension  home bp meds cartia xt, hyzaar held in the setting of sepsis /aki Bp /cr improving, started  back on low dose losartan per cards   Normocytic anemia, no overt sign  of bleeding, monitor, Report take B12 at home, continue Hyponatremia, resolved after hydration, encourage oral intake Hypokalemia, replace K, improved, recheck in the morning  Chronic back pain, resume norco, add on topical lidocaine patch, plan for mri L spine  w wo contrast if patient agrees to proceed.    Other incidental  findings on CT: -Chronic changes in the right lung with progressive scarring in the right lower lobe as well as calcified pleural plaques. Emphysema -7 mm left lower lobe nodule, Recommend a non-contrast Chest CT at 6-12 months, then another non-contrast Chest CT at 18-24 months -Aortic atherosclerosis -Diverticulosis without diverticulitis -Stable left adrenal lesion dating back to 2014 consistent with Adenoma .  I have Reviewed nursing notes, Vitals, pain scores, I/o's, Lab results and  imaging results since pt's last encounter, details please see discussion above  I ordered the following labs:  Unresulted Labs (From admission, onward)     Start     Ordered   07/03/22 0500  CBC with Differential/Platelet  Tomorrow morning,   R       Question:  Specimen collection method  Answer:  Lab=Lab collect   07/02/22 1858   07/01/22 6834  Basic metabolic panel  Daily,   R      06/30/22 0737             DVT prophylaxis: enoxaparin (LOVENOX) injection 40 mg Start: 07/02/22 1000  On heparin drip   Code Status:   Code Status: Full Code  Family Communication: Patient Disposition:    Dispo: The patient is from: Home              Anticipated d/c is to: Home              Anticipated d/c date is: > 48hrs, plan for  MRI l spine if patient agrees, needs cards clearance, cards plan for stress test prior to discharge  Antimicrobials:    Anti-infectives (From admission, onward)    Start     Dose/Rate Route Frequency Ordered Stop   06/30/22 1245  cefTRIAXone (ROCEPHIN) 2 g in sodium chloride 0.9 % 100 mL IVPB        2 g 200 mL/hr over 30 Minutes Intravenous Every 24 hours 06/30/22 1244     06/29/22 2145  ceFEPIme (MAXIPIME) 2 g in sodium chloride 0.9 % 100 mL IVPB        2 g 200 mL/hr over 30 Minutes Intravenous  Once 06/29/22 2140 06/29/22 2301   06/29/22 2145  vancomycin (VANCOCIN) IVPB 1000 mg/200 mL premix        1,000 mg 200 mL/hr over 60 Minutes Intravenous  Once 06/29/22 2140  06/29/22 2349          Objective: Vitals:   07/02/22 0735 07/02/22 0823 07/02/22 1212 07/02/22 1639  BP:  (!) 172/80 (!) 141/57 (!) 162/76  Pulse:  (!) 110 90 (!) 101  Resp:  '20 18 20  '$ Temp:  98.2 F (36.8 C) 98.6 F (37 C) 98.9 F (37.2 C)  TempSrc:  Oral Oral Oral  SpO2: 96%     Weight:      Height:        Intake/Output Summary (Last 24 hours) at 07/02/2022 1858 Last data filed at 07/02/2022 1532 Gross per 24 hour  Intake --  Output 1650 ml  Net -1650 ml   Filed Weights   06/29/22 2045 06/30/22 2037 07/01/22 0214  Weight: 72.6 kg 86.8 kg 86.8 kg    Examination:  General  exam: weak, aaox3, confusion appear has resolved Respiratory system: adequate air movement ,no wheezing, no rales, no rhonchi, respiratory effort normal. Cardiovascular system:  RRR.  Gastrointestinal system: Abdomen is nondistended, soft and nontender.  Normal bowel sounds heard. Central nervous system: Alert and oriented. No focal neurological deficits. Extremities:  no edema Skin: No rashes, lesions or ulcers Psychiatry: Fully alert, anxious    Data Reviewed: I have personally reviewed  labs and visualized  imaging studies since the last encounter and formulate the plan        Scheduled Meds:  atorvastatin  80 mg Oral Daily   Chlorhexidine Gluconate Cloth  6 each Topical Daily   diltiazem  30 mg Oral Q12H   enoxaparin (LOVENOX) injection  40 mg Subcutaneous Q24H   fluticasone furoate-vilanterol  1 puff Inhalation Daily   And   umeclidinium bromide  1 puff Inhalation Daily   ipratropium-albuterol  3 mL Nebulization BID   lidocaine  1 patch Transdermal Q24H   losartan  50 mg Oral Daily   [START ON 07/03/2022] polyethylene glycol  17 g Oral Daily   predniSONE  40 mg Oral Q breakfast   senna-docusate  1 tablet Oral QHS   tamsulosin  0.4 mg Oral QPC supper   cyanocobalamin  1,000 mcg Oral Daily   Continuous Infusions:  cefTRIAXone (ROCEPHIN)  IV 2 g (07/02/22 1527)   lactated  ringers Stopped (06/29/22 2116)     LOS: 2 days     Florencia Reasons, MD PhD FACP [facility]  Available via Epic secure chat 7am-7pm for nonurgent issues Please page for urgent issues To page the attending provider between 7A-7P or the covering provider during after hours 7P-7A, please log into the web site [email or web address] and access using universal [place] password for that web site. If you do not have the password, please call the hospital operator.    07/02/2022, 6:58 PM

## 2022-07-02 NOTE — Progress Notes (Signed)
   Patient at CT this am for PE study. If this is positive, could explain elevated troponins. If negative, then would consider myoview stress test tomorrow. Please keep NPO p MN for this and I will see her in the am tomorrow.  Pixie Casino, MD, [facility] [place] Director of the [facility]  [facility] Diplomate of the American Board of Clinical Lipidology Attending Cardiologist  Direct Dial: [phone]  Fax: [phone]  Website:  [email or web address]

## 2022-07-03 ENCOUNTER — Inpatient Hospital Stay (HOSPITAL_COMMUNITY): Payer: Medicare Other

## 2022-07-03 DIAGNOSIS — N10 Acute tubulo-interstitial nephritis: Secondary | ICD-10-CM | POA: Diagnosis not present

## 2022-07-03 DIAGNOSIS — R778 Other specified abnormalities of plasma proteins: Secondary | ICD-10-CM | POA: Diagnosis not present

## 2022-07-03 LAB — BASIC METABOLIC PANEL
Anion gap: 8 (ref 5–15)
BUN: 20 mg/dL (ref 8–23)
CO2: 41 mmol/L — ABNORMAL HIGH (ref 22–32)
Calcium: 8.6 mg/dL — ABNORMAL LOW (ref 8.9–10.3)
Chloride: 92 mmol/L — ABNORMAL LOW (ref 98–111)
Creatinine, Ser: 0.67 mg/dL (ref 0.44–1.00)
GFR, Estimated: >60 mL/min (ref >60)
Glucose, Bld: 96 mg/dL (ref 70–99)
Potassium: 4.8 mmol/L (ref 3.5–5.1)
Sodium: 141 mmol/L (ref 135–145)

## 2022-07-03 LAB — CBC WITH DIFFERENTIAL/PLATELET
Abs Immature Granulocytes: 0.33 10*3/uL — ABNORMAL HIGH (ref 0.00–0.07)
Basophils Absolute: 0 10*3/uL (ref 0.0–0.1)
Basophils Relative: 0 %
Eosinophils Absolute: 0 10*3/uL (ref 0.0–0.5)
Eosinophils Relative: 0 %
HCT: 30.5 % — ABNORMAL LOW (ref 36.0–46.0)
Hemoglobin: 9 g/dL — ABNORMAL LOW (ref 12.0–15.0)
Immature Granulocytes: 3 %
Lymphocytes Relative: 10 %
Lymphs Abs: 1 10*3/uL (ref 0.7–4.0)
MCH: 29 pg (ref 26.0–34.0)
MCHC: 29.5 g/dL — ABNORMAL LOW (ref 30.0–36.0)
MCV: 98.4 fL (ref 80.0–100.0)
Monocytes Absolute: 1.5 10*3/uL — ABNORMAL HIGH (ref 0.1–1.0)
Monocytes Relative: 14 %
Neutro Abs: 7.7 10*3/uL (ref 1.7–7.7)
Neutrophils Relative %: 73 %
Platelets: 177 10*3/uL (ref 150–400)
RBC: 3.1 MIL/uL — ABNORMAL LOW (ref 3.87–5.11)
RDW: 12.9 % (ref 11.5–15.5)
WBC: 10.6 10*3/uL — ABNORMAL HIGH (ref 4.0–10.5)
nRBC: 0 % (ref 0.0–0.2)

## 2022-07-03 MED ORDER — DILTIAZEM HCL 60 MG PO TABS
30.0000 mg | ORAL_TABLET | Freq: Three times a day (TID) | ORAL | Status: DC
  Administered 2022-07-03 – 2022-07-04 (×3): 30 mg via ORAL
  Filled 2022-07-03 (×3): qty 1

## 2022-07-03 MED ORDER — CLONAZEPAM 0.25 MG PO TBDP
0.2500 mg | ORAL_TABLET | Freq: Three times a day (TID) | ORAL | Status: DC | PRN
  Administered 2022-07-03 – 2022-07-07 (×9): 0.25 mg via ORAL
  Filled 2022-07-03 (×10): qty 1

## 2022-07-03 MED ORDER — PANTOPRAZOLE SODIUM 20 MG PO TBEC
20.0000 mg | DELAYED_RELEASE_TABLET | Freq: Every day | ORAL | Status: DC
  Administered 2022-07-03 – 2022-07-07 (×5): 20 mg via ORAL
  Filled 2022-07-03 (×5): qty 1

## 2022-07-03 MED ORDER — GADOBUTROL 1 MMOL/ML IV SOLN
7.5000 mL | Freq: Once | INTRAVENOUS | Status: AC | PRN
Start: 2022-07-03 — End: 2022-07-03
  Administered 2022-07-03: 7.5 mL via INTRAVENOUS

## 2022-07-03 MED ORDER — FUROSEMIDE 10 MG/ML IJ SOLN
20.0000 mg | Freq: Once | INTRAMUSCULAR | Status: AC
  Administered 2022-07-03: 20 mg via INTRAVENOUS
  Filled 2022-07-03: qty 2

## 2022-07-03 MED ORDER — PREDNISONE 20 MG PO TABS
20.0000 mg | ORAL_TABLET | Freq: Every day | ORAL | Status: DC
  Administered 2022-07-04: 20 mg via ORAL
  Filled 2022-07-03: qty 1

## 2022-07-03 NOTE — Progress Notes (Addendum)
PROGRESS NOTE    [name]  LMB:[id number] DOB: [birth year] DOA: 06/29/2022 PCP: Celene Squibb, MD     Brief Narrative:   History HTN, HLD, COPD on home 02, urinary retention on Flomax,  presents with  confusion and fever. EMS gave Solu-Medrol IV magnesium DuoNebs due to wheezing, Patient become progressively more confused and lethargic in the ED, ABG showed pH 7.26 with Paco2  81.7, she was started on BiPAP in the ED Found to have sepsis/pyelonephritis /bacteremia/aki and elevated troponin, cardiology plan to do stress test prior to discharge Also have low back pain, reports chronic, getting mri spine to rule out infection    Subjective:  She is sitting up in chair, she  is npo this am for possible stress test She reports chest tightness, bandlike feeling started from yesterday evening,  Also some epigastric ab pain, no bm for several days  No n/v  Per documentation she tolerated bipap well last night, but she saind she only did it for two and a half hours  No fever, aaox3, does appear to have impaired memory, she is very anxious    Assessment & Plan:  Principal Problem:   Sepsis ([place]) Active Problems:   Troponin level elevated   Acute pyelonephritis   E coli bacteremia   Acute metabolic encephalopathy   Acute on chronic respiratory failure with hypoxia and hypercapnia (HCC)   Sepsis secondary to UTI ([place])   Sepsis from pyelonephritis on the left /Ecoli Bacteremia Blood culture positive for pansensitive E. Coli, continue Rocephin for now,  now  renal function has improved, will do MRI L spine  wwo contrast  ( low back pain)  Acute urinary retention, presents on admission  On flomax at home Drained 1.2 liter after in and out cath on 7/7 am foley inserted on 7/7 Need voiding trial before discharge    Acute metabolic encephalopathy, from sepsis and CO2 retention, CT head no acute findings,  Improved on bipap, agreed to bipap qhs   Acute on chronic hypoxic  respiratory failure, COPD exacerbation EMS reported wheezing, no wheezing on exam today,  CTA No PE showed ill-defined ground-glass nodularity, small amount of secretions in the trachea, possible aspiration, chronic scaring , erythema.  calcified pleural plaques.Lung nodules, , needs outpatient pulmonology follow up already on antibiotics, continue scheduled nebs, prednisone, taper to off  Elevation of troponin, non-STEMI versus demand ischemia , -Was initially on Heparin drip,  discontinued on 7/7 per cards  -echo preserved LVEF, no wall motion abnormalities, grade 1 diastolic dysfunction,The right ventricular size is moderately enlarged. No  increase in right ventricular wall thickness. Right ventricular systolic function is normal. There is mildly elevated pulmonary artery systolic  pressure. -Reports has lasix with potassium orders prn for edema for the last 94yr from pcp, but only has to use it a few times a year -CTA no PE, mile pleural effusion, reports chest tightness, will give lasix '20mg'$  x1 on 7/9 -Will follow cards recommendation   AKI: From sepsis, pyelonephritis , urinary retention Cr normalized  HTN:  Presents with sepsis/hypotension  home bp meds cartia xt, hyzaar held in the setting of sepsis /aki Bp /cr improving, started  back on low dose losartan and cardizem, prn lasix  Epigastric pain, GERD/constipation? Initial CT ab/pel showed pyelo on the left , diverticulosis but no other abnormal gi findings, taper steroids, add ppi, treat constipation, check FOBT, monitor  Normocytic anemia, no overt sign of bleeding, monitor, Report take B12 at home, continue. Check  FOBT  Hyponatremia, resolved after hydration, encourage oral intake Hypokalemia, replace K, improved, recheck in the morning  Chronic back pain, resume norco, add on topical lidocaine patch, plan for mri L spine  w wo contrast.   Other incidental findings on CT: -Chronic changes in the right lung with  progressive scarring in the right lower lobe as well as calcified pleural plaques. Emphysema -7 mm left lower lobe nodule, Recommend a non-contrast Chest CT at 6-12 months, then another non-contrast Chest CT at 18-24 months -Aortic atherosclerosis -Diverticulosis without diverticulitis -Stable left adrenal lesion dating back to 2014 consistent with Adenoma .  I have Reviewed nursing notes, Vitals, pain scores, I/o's, Lab results and  imaging results since pt's last encounter, details please see discussion above  I ordered the following labs:  Unresulted Labs (From admission, onward)     Start     Ordered   07/04/22 0500  Magnesium  Tomorrow morning,   R       Question:  Specimen collection method  Answer:  Lab=Lab collect   07/03/22 0901   07/04/22 0500  CBC with Differential/Platelet  Tomorrow morning,   R       Question:  Specimen collection method  Answer:  Lab=Lab collect   07/03/22 0901   07/04/22 4854  Basic metabolic panel  Tomorrow morning,   R       Question:  Specimen collection method  Answer:  Lab=Lab collect   07/03/22 0901   07/04/22 0500  TSH  Tomorrow morning,   R       Question:  Specimen collection method  Answer:  Lab=Lab collect   07/03/22 0901             DVT prophylaxis: enoxaparin (LOVENOX) injection 40 mg Start: 07/02/22 1000  On heparin drip   Code Status:   Code Status: Full Code  Family Communication: daughter at bedside on 7/8 Disposition:    Dispo: The patient is from: Home              Anticipated d/c is to: Home              Anticipated d/c date is: > 48hrs, plan for  MRI l spine if patient agrees, needs cards clearance, cards plan for stress test prior to discharge  Antimicrobials:    Anti-infectives (From admission, onward)    Start     Dose/Rate Route Frequency Ordered Stop   06/30/22 1245  cefTRIAXone (ROCEPHIN) 2 g in sodium chloride 0.9 % 100 mL IVPB        2 g 200 mL/hr over 30 Minutes Intravenous Every 24 hours 06/30/22 1244      06/29/22 2145  ceFEPIme (MAXIPIME) 2 g in sodium chloride 0.9 % 100 mL IVPB        2 g 200 mL/hr over 30 Minutes Intravenous  Once 06/29/22 2140 06/29/22 2301   06/29/22 2145  vancomycin (VANCOCIN) IVPB 1000 mg/200 mL premix        1,000 mg 200 mL/hr over 60 Minutes Intravenous  Once 06/29/22 2140 06/29/22 2349          Objective: Vitals:   07/03/22 0312 07/03/22 0432 07/03/22 0750 07/03/22 0815  BP: (!) 145/62   (!) 168/69  Pulse: 79   (!) 105  Resp: 19   20  Temp: 98 F (36.7 C)   98 F (36.7 C)  TempSrc: Oral   Oral  SpO2: 98%  96%   Weight:  82.5 kg  Height:        Intake/Output Summary (Last 24 hours) at 07/03/2022 0901 Last data filed at 07/03/2022 0433 Gross per 24 hour  Intake 480 ml  Output 1950 ml  Net -1470 ml   Filed Weights   06/30/22 2037 07/01/22 0214 07/03/22 0432  Weight: 86.8 kg 86.8 kg 82.5 kg    Examination:  General exam: weak, aaox3, confusion appear has resolved, very anxious Respiratory system: overall diminished  ,no wheezing, no rales, no rhonchi, respiratory effort normal. Cardiovascular system:  RRR.  Gastrointestinal system: mild epigastric tenderness, no rebound, no guarding,  Normal bowel sounds heard. Central nervous system: Alert and oriented. No focal neurological deficits. Extremities:  no edema Skin: No rashes, lesions or ulcers Psychiatry: Fully alert, anxious    Data Reviewed: I have personally reviewed  labs and visualized  imaging studies since the last encounter and formulate the plan        Scheduled Meds:  atorvastatin  80 mg Oral Daily   Chlorhexidine Gluconate Cloth  6 each Topical Daily   diltiazem  30 mg Oral Q8H   enoxaparin (LOVENOX) injection  40 mg Subcutaneous Q24H   fluticasone furoate-vilanterol  1 puff Inhalation Daily   And   umeclidinium bromide  1 puff Inhalation Daily   furosemide  20 mg Intravenous Once   ipratropium-albuterol  3 mL Nebulization BID   lidocaine  1 patch Transdermal Q24H    losartan  50 mg Oral Daily   pantoprazole  20 mg Oral Daily   polyethylene glycol  17 g Oral Daily   [START ON 07/04/2022] predniSONE  20 mg Oral Q breakfast   senna-docusate  1 tablet Oral QHS   tamsulosin  0.4 mg Oral QPC supper   cyanocobalamin  1,000 mcg Oral Daily   Continuous Infusions:  cefTRIAXone (ROCEPHIN)  IV 2 g (07/02/22 1527)   lactated ringers Stopped (06/29/22 2116)     LOS: 3 days     Florencia Reasons, MD PhD FACP [facility]  Available via Epic secure chat 7am-7pm for nonurgent issues Please page for urgent issues To page the attending provider between 7A-7P or the covering provider during after hours 7P-7A, please log into the web site [email or web address] and access using universal [place] password for that web site. If you do not have the password, please call the hospital operator.    07/03/2022, 9:01 AM

## 2022-07-03 NOTE — Progress Notes (Addendum)
Progress Note  Patient Name: [name] Date of Encounter: 07/03/2022  [facility] Cardiologist: None   Subjective   Still reports chest pressure - CT negative for PE, perhaps some mild volume overload - remains tachycardic, anxious.  Inpatient Medications    Scheduled Meds:  atorvastatin  80 mg Oral Daily   Chlorhexidine Gluconate Cloth  6 each Topical Daily   diltiazem  30 mg Oral Q8H   enoxaparin (LOVENOX) injection  40 mg Subcutaneous Q24H   fluticasone furoate-vilanterol  1 puff Inhalation Daily   And   umeclidinium bromide  1 puff Inhalation Daily   ipratropium-albuterol  3 mL Nebulization BID   lidocaine  1 patch Transdermal Q24H   losartan  50 mg Oral Daily   pantoprazole  20 mg Oral Daily   polyethylene glycol  17 g Oral Daily   [START ON 07/04/2022] predniSONE  20 mg Oral Q breakfast   senna-docusate  1 tablet Oral QHS   tamsulosin  0.4 mg Oral QPC supper   cyanocobalamin  1,000 mcg Oral Daily   Continuous Infusions:  cefTRIAXone (ROCEPHIN)  IV 2 g (07/02/22 1527)   lactated ringers Stopped (06/29/22 2116)   PRN Meds: albuterol, clonazePAM, hydrALAZINE, HYDROcodone-acetaminophen, melatonin, ondansetron (ZOFRAN) IV   Vital Signs    Vitals:   07/03/22 0312 07/03/22 0432 07/03/22 0750 07/03/22 0815  BP: (!) 145/62   (!) 168/69  Pulse: 79   (!) 105  Resp: 19   20  Temp: 98 F (36.7 C)   98 F (36.7 C)  TempSrc: Oral   Oral  SpO2: 98%  96%   Weight:  82.5 kg    Height:        Intake/Output Summary (Last 24 hours) at 07/03/2022 1259 Last data filed at 07/03/2022 0433 Gross per 24 hour  Intake 480 ml  Output 1950 ml  Net -1470 ml      07/03/2022    4:32 AM 07/01/2022    2:14 AM 06/30/2022    8:37 PM  Last 3 Weights  Weight (lbs) 181 lb 14.1 oz 191 lb 5.8 oz 191 lb 5.8 oz  Weight (kg) 82.5 kg 86.8 kg 86.8 kg      Telemetry    Sinus tachy  - Personally Reviewed  ECG    No new ECG tracing today. - Personally Reviewed  Physical Exam    GEN: Caucasian female in no acute distress.   Neck: No JVD. Cardiac: RRR. Soft II/VI systolic murmur noted Respiratory: CTA GI: Soft, non-distended, and non-tender. MS: No lower extremity edema. No deformity. Neuro:  Alert and oriented x3. No focal deficits. Psych: Normal affect.  Labs    High Sensitivity Troponin:   Recent Labs  Lab 06/29/22 2057 06/29/22 2350 07/01/22 0647  TROPONINIHS 896* 1,114* 128*     Chemistry Recent Labs  Lab 06/29/22 2057 06/30/22 0251 06/30/22 0451 07/01/22 0207 07/02/22 0122 07/03/22 0221  NA 131*   < > 133* 137 140 141  K 3.4*   < > 3.6 3.7 4.1 4.8  CL 85*  --  91* 94* 96* 92*  CO2 33*  --  32 33* 35* 41*  GLUCOSE 152*  --  212* 155* 129* 96  BUN 26*  --  25* 30* 23 20  CREATININE 1.29*  --  1.12* 0.94 0.83 0.67  CALCIUM 8.2*  --  7.8* 8.4* 8.5* 8.6*  MG  --   --  2.3 2.4  --   --   PROT  6.4*  --  5.8*  --   --   --   ALBUMIN 3.2*  --  2.6*  --   --   --   AST 24  --  21  --   --   --   ALT 16  --  15  --   --   --   ALKPHOS 65  --  54  --   --   --   BILITOT 0.4  --  0.4  --   --   --   GFRNONAA 45*  --  54* >60 >60 >60  ANIONGAP 13  --  '10 10 9 8   '$ < > = values in this interval not displayed.    Lipids  Recent Labs  Lab 06/30/22 0451  CHOL 154  TRIG 98  HDL 50  LDLCALC 84  CHOLHDL 3.1    Hematology Recent Labs  Lab 07/01/22 0207 07/02/22 0122 07/03/22 0221  WBC 16.8* 12.6* 10.6*  RBC 3.21* 3.07* 3.10*  HGB 9.4* 8.8* 9.0*  HCT 30.7* 30.0* 30.5*  MCV 95.6 97.7 98.4  MCH 29.3 28.7 29.0  MCHC 30.6 29.3* 29.5*  RDW 12.9 12.9 12.9  PLT 165 180 177   Thyroid No results for input(s): "TSH", "FREET4" in the last 168 hours.  BNPNo results for input(s): "BNP", "PROBNP" in the last 168 hours.  DDimer No results for input(s): "DDIMER" in the last 168 hours.   Radiology    DG Abd 1 View  Result Date: 07/03/2022 CLINICAL DATA:  Abdominal pain. EXAM: ABDOMEN - 1 VIEW COMPARISON:  CT, 06/30/2022. FINDINGS: Normal bowel  gas pattern.  No evidence of renal or ureteral stones. No acute skeletal abnormality. IMPRESSION: No acute findings. Electronically Signed   By: Lajean Manes M.D.   On: 07/03/2022 10:08   CT Angio Chest Pulmonary Embolism (PE) W or WO Contrast  Result Date: 07/02/2022 CLINICAL DATA:  Sepsis due to left pyelonephritis. Acute on chronic respiratory failure. EXAM: CT ANGIOGRAPHY CHEST WITH CONTRAST TECHNIQUE: Multidetector CT imaging of the chest was performed using the standard protocol during bolus administration of intravenous contrast. Multiplanar CT image reconstructions and MIPs were obtained to evaluate the vascular anatomy. RADIATION DOSE REDUCTION: This exam was performed according to the departmental dose-optimization program which includes automated exposure control, adjustment of the mA and/or kV according to patient size and/or use of iterative reconstruction technique. CONTRAST:  143m OMNIPAQUE IOHEXOL 350 MG/ML SOLN COMPARISON:  CT chest dated June 30, 2022. FINDINGS: Cardiovascular: Satisfactory opacification of the pulmonary arteries to the segmental level. No evidence of pulmonary embolism. Unchanged mild cardiomegaly. No pericardial effusion. No thoracic aortic aneurysm or dissection. Coronary, aortic arch, and branch vessel atherosclerotic vascular disease. Mediastinum/Nodes: Borderline enlarged right hilar lymph node measuring up to 9 mm in short axis, unchanged. No enlarged mediastinal or axillary lymph nodes. The thyroid gland and esophagus demonstrate no significant findings. Lungs/Pleura: Small amount of secretions in the trachea. New trace left pleural effusion. Increased clustered ill-defined ground-glass nodularity in the superior segment of the left lower lobe (series 8, image 39). Unchanged 8 mm ground-glass nodule in the left lower lobe (series 8, image 68). Scattered tiny centrilobular nodules in both lungs again noted. Increased subsegmental atelectasis in the left lower lobe.  Similar scarring and architectural distortion with volume loss in the right lung. Chronic right pleural thickening and calcification again noted. Moderate centrilobular emphysema. Upper Abdomen: Unchanged patchy enhancement of the left kidney. Unchanged 1.5 cm left adrenal benign  adenoma. No follow-up imaging is recommended. Musculoskeletal: No chest wall abnormality. No acute or significant osseous findings. Review of the MIP images confirms the above findings. IMPRESSION: 1. No evidence of pulmonary embolism. 2. Increased clustered ill-defined ground-glass nodularity in the superior segment of the left lower lobe, with otherwise similar scattered tiny centrilobular nodules in both, most lungs consistent with infectious or inflammatory bronchiolitis, possibly aspiration related given small amount of secretions in the trachea. 3. New trace left pleural effusion. 4. Unchanged 8 mm ground-glass nodule in the left lower lobe. Initial follow-up with CT at 6 months is recommended to confirm persistence. If persistent, repeat CT is recommended every 2 years until 5 years of stability has been established. This recommendation follows the consensus statement: Guidelines for Management of Incidental Pulmonary Nodules Detected on CT Images: From the Fleischner Society 2017; Radiology 2017; 284:228-243. 5. Unchanged left pyelonephritis. 6. Aortic Atherosclerosis (ICD10-I70.0) and Emphysema (ICD10-J43.9). Electronically Signed   By: Titus Dubin M.D.   On: 07/02/2022 11:35    Cardiac Studies   Echo  07/01/22   1. Left ventricular ejection fraction, by estimation, is 55 to 60%. The  left ventricle has normal function. The left ventricle has no regional  wall motion abnormalities. Left ventricular diastolic parameters are  consistent with Grade I diastolic  dysfunction (impaired relaxation).   2. Right ventricular systolic function is normal. The right ventricular  size is moderately enlarged. There is mildly  elevated pulmonary artery  systolic pressure. The estimated right ventricular systolic pressure is  74.0 mmHg.   3. Left atrial size was mildly dilated.   4. The mitral valve is normal in structure. No evidence of mitral valve  regurgitation. No evidence of mitral stenosis.   5. Tricuspid valve regurgitation is moderate.   6. The aortic valve is normal in structure. Aortic valve regurgitation is  not visualized. No aortic stenosis is present.   7. The inferior vena cava is normal in size with greater than 50%  respiratory variability, suggesting right atrial pressure of 3 mmHg.   Patient Profile     71 y.o. female with a history of COPD, hypertension, hyperlipidemia, chronic pain syndrome and anxiety who was admitted on 06/29/2022 with severe sepsis secondary to pyelonephritis after presenting with fever and altered mental status. Cardiology was consulted for further evaluation of elevated troponin.  Assessment & Plan    Elevated Troponin Pt denied and continues to deny chest pain  Today she points to upper abdomen, epigaastric rgeion     Troponins yesterday  896, 1114   Today is 128   Echo  today as noted above  LVEF normal without regional changes  Will stop heparin IV today  Give SQ LOvenox for DVT Continue aspirin and high-intensity statin.  Troponin elevation may just reflect demand in setting of severe infection   Given risk factors (HTN, HL, tob, FHx) would schedule adenosine myoview closer to discharge  to rule out ischemia  Will plan myoview stress test tomorrow- keep NPO p MN. Ok to eat diet today.  Shared Decision Making/Informed Consent The risks [chest pain, shortness of breath, cardiac arrhythmias, dizziness, blood pressure fluctuations, myocardial infarction, stroke/transient ischemic attack, nausea, vomiting, allergic reaction, radiation exposure, metallic taste sensation and life-threatening complications (estimated to be 1 in 10,000)], benefits (risk stratification,  diagnosing coronary artery disease, treatment guidance) and alternatives of a nuclear stress test were discussed in detail with Ms. Dittman and she agrees to proceed.   Hypertension BP is increased from yesterday  Will add losartan 50   She was on losartan /HCTZ 100/25 and cartia 180 bid at home   Follow     Hyperlipidemia Lipid panel this admission: Total Cholesterol 154, Triglycerides 98, HDL 50, LDL 84. LDL goal <70 given atherosclerotic calcification of aorta noted on CT this admission.  - On Lipitor '40mg'$  daily at home. Will increase to '80mg'$  daily. - Will need repeat lipid panel and LFTs in 6-8 weeks.  AKI Creatinine 1.29 on admission. Baseline around 0.4. Felt to be due to dehydration. - Resolved  Hypokalemia Potassium as low as 3.2 this admission, now up to 4.8  ID    Pt being treated for urosepsis     For questions or updates, please contact [facility] [facility] Please consult [email or web address] for contact info under   Pixie Casino, MD, FACC, [place] Director of the [facility] Diplomate of the American Board of Clinical Lipidology Attending Cardiologist  Direct Dial: [phone]  Fax: [phone]  Website:  [email or web address]  Pixie Casino, MD  07/03/2022, 12:59 PM

## 2022-07-03 NOTE — Plan of Care (Signed)

## 2022-07-04 ENCOUNTER — Inpatient Hospital Stay (HOSPITAL_COMMUNITY): Payer: Medicare Other

## 2022-07-04 DIAGNOSIS — R079 Chest pain, unspecified: Secondary | ICD-10-CM | POA: Diagnosis not present

## 2022-07-04 DIAGNOSIS — A419 Sepsis, unspecified organism: Secondary | ICD-10-CM | POA: Diagnosis not present

## 2022-07-04 LAB — CBC WITH DIFFERENTIAL/PLATELET
Abs Immature Granulocytes: 0.36 10*3/uL — ABNORMAL HIGH (ref 0.00–0.07)
Basophils Absolute: 0 10*3/uL (ref 0.0–0.1)
Basophils Relative: 0 %
Eosinophils Absolute: 0 10*3/uL (ref 0.0–0.5)
Eosinophils Relative: 0 %
HCT: 31.6 % — ABNORMAL LOW (ref 36.0–46.0)
Hemoglobin: 9.3 g/dL — ABNORMAL LOW (ref 12.0–15.0)
Immature Granulocytes: 3 %
Lymphocytes Relative: 13 %
Lymphs Abs: 1.4 10*3/uL (ref 0.7–4.0)
MCH: 28.4 pg (ref 26.0–34.0)
MCHC: 29.4 g/dL — ABNORMAL LOW (ref 30.0–36.0)
MCV: 96.6 fL (ref 80.0–100.0)
Monocytes Absolute: 1.6 10*3/uL — ABNORMAL HIGH (ref 0.1–1.0)
Monocytes Relative: 15 %
Neutro Abs: 7.4 10*3/uL (ref 1.7–7.7)
Neutrophils Relative %: 69 %
Platelets: 191 10*3/uL (ref 150–400)
RBC: 3.27 MIL/uL — ABNORMAL LOW (ref 3.87–5.11)
RDW: 12.8 % (ref 11.5–15.5)
WBC: 10.7 10*3/uL — ABNORMAL HIGH (ref 4.0–10.5)
nRBC: 0 % (ref 0.0–0.2)

## 2022-07-04 LAB — NM MYOCAR MULTI W/SPECT W/WALL MOTION / EF
Estimated workload: 1
Exercise duration (min): 6 min
Exercise duration (sec): 49 s
LV dias vol: 130 mL (ref 46–106)
Nuc Stress EF: 43 %
Peak HR: 127 {beats}/min
Rest HR: 95 {beats}/min
Rest Nuclear Isotope Dose: 10.9 mCi
ST Depression (mm): 0 mm
Stress Nuclear Isotope Dose: 28.9 mCi
TID: 0.92

## 2022-07-04 LAB — BASIC METABOLIC PANEL
Anion gap: 7 (ref 5–15)
BUN: 21 mg/dL (ref 8–23)
CO2: 44 mmol/L — ABNORMAL HIGH (ref 22–32)
Calcium: 8.4 mg/dL — ABNORMAL LOW (ref 8.9–10.3)
Chloride: 90 mmol/L — ABNORMAL LOW (ref 98–111)
Creatinine, Ser: 0.91 mg/dL (ref 0.44–1.00)
GFR, Estimated: >60 mL/min (ref >60)
Glucose, Bld: 95 mg/dL (ref 70–99)
Potassium: 4.2 mmol/L (ref 3.5–5.1)
Sodium: 141 mmol/L (ref 135–145)

## 2022-07-04 LAB — TSH: TSH: 1.06 u[IU]/mL (ref 0.350–4.500)

## 2022-07-04 LAB — PROCALCITONIN: Procalcitonin: 0.58 ng/mL

## 2022-07-04 LAB — MAGNESIUM: Magnesium: 1.9 mg/dL (ref 1.7–2.4)

## 2022-07-04 MED ORDER — TECHNETIUM TC 99M TETROFOSMIN IV KIT
10.9000 | PACK | Freq: Once | INTRAVENOUS | Status: AC | PRN
  Administered 2022-07-04: 10.9 via INTRAVENOUS

## 2022-07-04 MED ORDER — CEFAZOLIN SODIUM-DEXTROSE 2-4 GM/100ML-% IV SOLN
2.0000 g | Freq: Three times a day (TID) | INTRAVENOUS | Status: DC
Start: 2022-07-04 — End: 2022-07-08
  Administered 2022-07-04 – 2022-07-07 (×10): 2 g via INTRAVENOUS
  Filled 2022-07-04 (×10): qty 100

## 2022-07-04 MED ORDER — CARVEDILOL 6.25 MG PO TABS
6.2500 mg | ORAL_TABLET | Freq: Two times a day (BID) | ORAL | Status: DC
Start: 2022-07-04 — End: 2022-07-04

## 2022-07-04 MED ORDER — PREDNISONE 5 MG PO TABS
10.0000 mg | ORAL_TABLET | Freq: Every day | ORAL | Status: AC
  Administered 2022-07-05: 10 mg via ORAL
  Filled 2022-07-04: qty 2

## 2022-07-04 MED ORDER — LORAZEPAM 2 MG/ML IJ SOLN: 0.5000 mg | Freq: Once | INTRAMUSCULAR | Status: DC

## 2022-07-04 MED ORDER — TECHNETIUM TC 99M TETROFOSMIN IV KIT
29.8000 | PACK | Freq: Once | INTRAVENOUS | Status: AC | PRN
  Administered 2022-07-04: 29.8 via INTRAVENOUS

## 2022-07-04 MED ORDER — DILTIAZEM HCL 60 MG PO TABS
60.0000 mg | ORAL_TABLET | Freq: Three times a day (TID) | ORAL | Status: DC
  Administered 2022-07-04 – 2022-07-06 (×6): 60 mg via ORAL
  Filled 2022-07-04 (×6): qty 1

## 2022-07-04 MED ORDER — REGADENOSON 0.4 MG/5ML IV SOLN
0.4000 mg | Freq: Once | INTRAVENOUS | Status: AC
  Administered 2022-07-04: 0.4 mg via INTRAVENOUS
  Filled 2022-07-04: qty 5

## 2022-07-04 MED ORDER — REGADENOSON 0.4 MG/5ML IV SOLN
INTRAVENOUS | Status: AC
  Filled 2022-07-04: qty 5

## 2022-07-04 NOTE — NC FL2 (Signed)
[place] LEVEL OF CARE SCREENING TOOL     IDENTIFICATION  Patient Name: [name] Birthdate: [birth year] Sex: female Admission Date (Current Location): 06/29/2022  [facility] and [place] Number:  Herbalist and Address:  [facility], [place] [address] [place], [place] [address]      Provider Number: [id number]  Attending Physician Name and Address:  Thurnell Lose, MD  Relative Name and Phone Number:  Esmond Plants, [phone]    Current Level of Care: Hospital Recommended Level of Care: [place] Prior Approval Number:    Date Approved/Denied:   PASRR Number: [id number]  Discharge Plan: SNF    Current Diagnoses: Patient Active Problem List   Diagnosis Date Noted   Sepsis ([place]) 06/30/2022   Acute pyelonephritis 06/30/2022   E coli bacteremia 41/32/4401   Acute metabolic encephalopathy 02/72/5366   Acute on chronic respiratory failure with hypoxia and hypercapnia ([place]) 06/30/2022   Sepsis secondary to UTI ([place]) 06/30/2022   Troponin level elevated    COPD exacerbation ([place]) 06/15/2021   Hypertension    Anxiety    CHF (congestive heart failure) (HCC)    Therapeutic opioid-induced constipation ([facility]IC) 05/27/2021   Urinary retention 07/01/2020   Hematochezia 11/09/2018   Presence of left artificial elbow joint 06/29/2017   Closed displaced fracture of head of radius with routine healing 06/29/2017   Urinary urgency 01/25/2016   Blood in urine 10/09/2015   Vaginal bleeding 10/09/2015   Atrophic vaginitis 10/09/2015   Hematuria 10/09/2015   GERD (gastroesophageal reflux disease) 09/04/2014   Macrocytic anemia 09/04/2014   Constipation 07/14/2014   Nicotine addiction 04/07/2014    Orientation RESPIRATION BLADDER Height & Weight     Self, Time, Situation, Place  O2 ([place] 4) Continent, Indwelling catheter Weight: 182 lb 5.1 oz (82.7 kg) Height:  '5\' 3"'$  (160 cm)  BEHAVIORAL SYMPTOMS/MOOD  NEUROLOGICAL BOWEL NUTRITION STATUS      Continent Diet (See DC summary)  AMBULATORY STATUS COMMUNICATION OF NEEDS Skin   Extensive Assist Verbally Normal                       Personal Care Assistance Level of Assistance  Bathing, Feeding, Dressing Bathing Assistance: Maximum assistance Feeding assistance: Limited assistance Dressing Assistance: Maximum assistance     Functional Limitations Info  Sight, Hearing, Speech Sight Info: Impaired Hearing Info: Adequate Speech Info: Adequate    SPECIAL CARE FACTORS FREQUENCY  PT (By licensed PT), OT (By licensed OT)     PT Frequency: 5x week OT Frequency: 5x week            Contractures Contractures Info: Not present    Additional Factors Info  Code Status, Allergies Code Status Info: Full Allergies Info: NKA           Current Medications (07/04/2022):  This is the current hospital active medication list Current Facility-Administered Medications  Medication Dose Route Frequency Provider Last Rate Last Admin   albuterol (PROVENTIL) (2.5 MG/3ML) 0.083% nebulizer solution 2.5 mg  2.5 mg Nebulization Q2H PRN Corey Harold, NP   2.5 mg at 07/04/22 0929   atorvastatin (LIPITOR) tablet 80 mg  80 mg Oral Daily Sande Rives E, PA-C   80 mg at 07/03/22 1923   ceFAZolin (ANCEF) IVPB 2g/100 mL premix  2 g Intravenous Q8H Lala Lund K, MD 200 mL/hr at 07/04/22 1600 2 g at 07/04/22 1600   Chlorhexidine Gluconate Cloth 2 % PADS 6  each  6 each Topical Daily Florencia Reasons, MD   6 each at 07/04/22 1032   clonazePAM (KLONOPIN) disintegrating tablet 0.25 mg  0.25 mg Oral TID PRN Florencia Reasons, MD   0.25 mg at 07/04/22 1356   diltiazem (CARDIZEM) tablet 60 mg  60 mg Oral Q8H Thurnell Lose, MD   60 mg at 07/04/22 1555   enoxaparin (LOVENOX) injection 40 mg  40 mg Subcutaneous Q24H Reome, Earle J, RPH   40 mg at 07/04/22 0835   fluticasone furoate-vilanterol (BREO ELLIPTA) 100-25 MCG/ACT 1 puff  1 puff Inhalation Daily Florencia Reasons, MD   1  puff at 07/04/22 0909   And   umeclidinium bromide (INCRUSE ELLIPTA) 62.5 MCG/ACT 1 puff  1 puff Inhalation Daily Florencia Reasons, MD   1 puff at 07/04/22 0909   hydrALAZINE (APRESOLINE) tablet 10 mg  10 mg Oral Q6H PRN Florencia Reasons, MD       HYDROcodone-acetaminophen [facility]) 10-325 MG per tablet 1 tablet  1 tablet Oral QID PRN Florencia Reasons, MD   1 tablet at 07/04/22 1555   lactated ringers bolus 250 mL  250 mL Intravenous Once Sherrill Raring, PA-C   Held at 06/29/22 2116   lidocaine (LIDODERM) 5 % 1 patch  1 patch Transdermal Q24H Florencia Reasons, MD   1 patch at 07/04/22 1555   LORazepam (ATIVAN) injection 0.5 mg  0.5 mg Intravenous Once Thurnell Lose, MD       losartan (COZAAR) tablet 50 mg  50 mg Oral Daily Dorris Carnes V, MD   50 mg at 07/04/22 0835   melatonin tablet 5 mg  5 mg Oral Once PRN Shela Leff, MD       ondansetron [facility]) injection 4 mg  4 mg Intravenous Q6H PRN [place] Crane, Carole N, DO       pantoprazole (PROTONIX) EC tablet 20 mg  20 mg Oral Daily Florencia Reasons, MD   20 mg at 07/04/22 0835   polyethylene glycol (MIRALAX / GLYCOLAX) packet 17 g  17 g Oral Daily Florencia Reasons, MD   17 g at 07/03/22 0827   [START ON 07/05/2022] predniSONE (DELTASONE) tablet 10 mg  10 mg Oral Q breakfast Thurnell Lose, MD       regadenoson (LEXISCAN) 0.4 MG/5ML injection SOLN            senna-docusate (Senokot-S) tablet 1 tablet  1 tablet Oral QHS Florencia Reasons, MD   1 tablet at 07/02/22 2052   tamsulosin (FLOMAX) capsule 0.4 mg  0.4 mg Oral QPC supper Florencia Reasons, MD   0.4 mg at 07/03/22 1639   vitamin B-12 (CYANOCOBALAMIN) tablet 1,000 mcg  1,000 mcg Oral Daily Florencia Reasons, MD   1,000 mcg at 07/04/22 4540     Discharge Medications: Please see discharge summary for a list of discharge medications.  Relevant Imaging Results:  Relevant Lab Results:   Additional Information SS# [place], [place]

## 2022-07-04 NOTE — Progress Notes (Signed)
PROGRESS NOTE                                                                                                                                                                                                             Patient Demographics:    [name], is a 71 y.o. female, DOB - 1951-05-29, EUM:[id number]  Outpatient Primary MD for the patient is Celene Squibb, MD    LOS - 4  Admit date - 06/29/2022    Chief Complaint  Patient presents with   Shortness of Breath   Chest Pain   Altered Mental Status       Brief Narrative (HPI from H&P)   71 year old female with history of HTN, HLD, COPD on home 02, urinary retention on Flomax,  presents with  confusion and fever.  She was diagnosed with urinary retention causing UTI and E. coli bacteremia along with sepsis.   Subjective:    [name] today has, No headache, No chest pain, No abdominal pain - No Nausea, No new weakness tingling or numbness, mild SOB.   Assessment  & Plan :   Urinary retention causing pyelonephritis, sepsis present on admission, E. coli bacteremia.  Stable.  On IV Rocephin, sepsis pathophysiology has resolved. She has been placed on Flomax, Foley inserted, overall much improved, will give total of 7 to 10 days of antibiotics.  Sepsis pathophysiology clinically has resolved.  Will check renal ultrasound to make sure there is no high obstruction.  Toxic encephalopathy.  Due to #1 above.  Resolved.  Acute on chronic hypoxic respiratory failure due to COPD exacerbation.  Stable CTA, support supportive care improving.  Taper off prednisone, increase activity and titrate down oxygen.   8 mm ground-glass nodule in the left lower lobe.  PCP to arrange outpatient pulmonary follow-up within 1 to 2 weeks of discharge for follow-up and monitoring.  AKI.  Due to sepsis and urinary retention.  Resolved after hydration and IV fluids.  Chronic low back pain.   Nonacute MRI L-spine.  Chronic normocytic anemia.  Stable.  Hypertension.  On ARB and Cardizem, Cardizem dose increased for better control.  Nonspecific chest pain and mild elevation in troponin.  Question NSTEMI.  Seen by cardiology, echo stable with preserved EF, chest pain-free, going for Cardiolite stress test on 07/04/2022.  Currently on combination of Cardizem  and statin for secondary prevention.      Condition -  Fair  Family Communication  :  None  Code Status :  Full  Consults  :  Cards  PUD Prophylaxis :    Procedures  :     CTA Chest - 1. No evidence of pulmonary embolism. 2. Increased clustered ill-defined ground-glass nodularity in the superior segment of the left lower lobe, with otherwise similar scattered tiny centrilobular nodules in both, most lungs consistent with infectious or inflammatory bronchiolitis, possibly aspiration related given small amount of secretions in the trachea. 3. New trace left pleural effusion. 4. Unchanged 8 mm ground-glass nodule in the left lower lobe. Initial follow-up with CT at 6 months is recommended to confirm persistence. If persistent, repeat CT is recommended every 2 years until 5 years of stability has been established.  TTE -    1. Left ventricular ejection fraction, by estimation, is 55 to 60%. The left ventricle has normal function. The left ventricle has no regional wall motion abnormalities. Left ventricular diastolic parameters are consistent with Grade I diastolic dysfunction (impaired relaxation).  2. Right ventricular systolic function is normal. The right ventricular size is moderately enlarged. There is mildly elevated pulmonary artery systolic pressure. The estimated right ventricular systolic pressure is 52.7 mmHg.  3. Left atrial size was mildly dilated.  4. The mitral valve is normal in structure. No evidence of mitral valve regurgitation. No evidence of mitral stenosis.  5. Tricuspid valve regurgitation is moderate.  6. The  aortic valve is normal in structure. Aortic valve regurgitation is not visualized. No aortic stenosis is present.  7. The inferior vena cava is normal in size with greater than 50% respiratory variability, suggesting right atrial pressure of 3 mmHg   MRI L Spine - 1. No acute abnormality.  No osteomyelitis discitis. 2. Multilevel lumbar spondylosis as described above. Moderate bilateral neuroforaminal stenosis at L5-S1  CT Abd - Pelvis -  Patchy enhancement in the left kidney consistent with pyelonephritis. Minimal fullness of the proximal ureter is seen although no obstructing lesion is noted.  Diverticulosis without diverticulitis.   Stable left adrenal lesion dating back to 2014 consistent with  adenoma.  Cardiolite stress test.           Disposition Plan  :    Status is: Inpatient  DVT Prophylaxis  :    enoxaparin (LOVENOX) injection 40 mg Start: 07/02/22 1000    Lab Results  Component Value Date   PLT 191 07/04/2022    Diet :  Diet Order             Diet NPO time specified Except for: Sips with Meds  Diet effective midnight                    Inpatient Medications  Scheduled Meds:  atorvastatin  80 mg Oral Daily   Chlorhexidine Gluconate Cloth  6 each Topical Daily   diltiazem  30 mg Oral Q8H   enoxaparin (LOVENOX) injection  40 mg Subcutaneous Q24H   fluticasone furoate-vilanterol  1 puff Inhalation Daily   And   umeclidinium bromide  1 puff Inhalation Daily   lidocaine  1 patch Transdermal Q24H   losartan  50 mg Oral Daily   pantoprazole  20 mg Oral Daily   polyethylene glycol  17 g Oral Daily   predniSONE  20 mg Oral Q breakfast   senna-docusate  1 tablet Oral QHS   tamsulosin  0.4 mg Oral  QPC supper   cyanocobalamin  1,000 mcg Oral Daily   Continuous Infusions:  cefTRIAXone (ROCEPHIN)  IV 2 g (07/03/22 1301)   lactated ringers Stopped (06/29/22 2116)   PRN Meds:.albuterol, clonazePAM, hydrALAZINE, HYDROcodone-acetaminophen, melatonin,  ondansetron (ZOFRAN) IV  Antibiotics  :    Anti-infectives (From admission, onward)    Start     Dose/Rate Route Frequency Ordered Stop   06/30/22 1245  cefTRIAXone (ROCEPHIN) 2 g in sodium chloride 0.9 % 100 mL IVPB        2 g 200 mL/hr over 30 Minutes Intravenous Every 24 hours 06/30/22 1244     06/29/22 2145  ceFEPIme (MAXIPIME) 2 g in sodium chloride 0.9 % 100 mL IVPB        2 g 200 mL/hr over 30 Minutes Intravenous  Once 06/29/22 2140 06/29/22 2301   06/29/22 2145  vancomycin (VANCOCIN) IVPB 1000 mg/200 mL premix        1,000 mg 200 mL/hr over 60 Minutes Intravenous  Once 06/29/22 2140 06/29/22 2349        Time Spent in minutes  30   Lala Lund M.D on 07/04/2022 at 11:40 AM  To page go to [email or web address]   [facility] -  Office  [phone]  See all Orders from today for further details    Objective:   Vitals:   07/04/22 0417 07/04/22 0812 07/04/22 0909 07/04/22 0929  BP: (!) 125/59 (!) 143/98    Pulse: 73 91    Resp: (!) 22 17    Temp: 98 F (36.7 C) 98.7 F (37.1 C)    TempSrc: Oral Oral    SpO2: 95% 95% 92% 97%  Weight:      Height:        Wt Readings from Last 3 Encounters:  07/04/22 82.7 kg  06/15/21 72.6 kg  03/25/21 71.2 kg     Intake/Output Summary (Last 24 hours) at 07/04/2022 1140 Last data filed at 07/04/2022 0100 Gross per 24 hour  Intake 200 ml  Output 2425 ml  Net -2225 ml     Physical Exam  Awake Alert, No new F.N deficits, Normal affect [place].AT,PERRAL Supple Neck, No JVD,   Symmetrical Chest wall movement, Good air movement bilaterally, CTAB RRR,No Gallops,Rubs or new Murmurs,  +ve B.Sounds, Abd Soft, No tenderness,   No Cyanosis, Clubbing or edema      Data Review:    CBC Recent Labs  Lab 06/29/22 2057 06/30/22 0251 06/30/22 0451 07/01/22 0207 07/02/22 0122 07/03/22 0221 07/04/22 0049  WBC 22.2*  --  16.5* 16.8* 12.6* 10.6* 10.7*  HGB 10.3*   < > 9.5* 9.4* 8.8* 9.0* 9.3*  HCT 34.3*   < > 31.1* 30.7*  30.0* 30.5* 31.6*  PLT 160  --  146* 165 180 177 191  MCV 97.2  --  96.6 95.6 97.7 98.4 96.6  MCH 29.2  --  29.5 29.3 28.7 29.0 28.4  MCHC 30.0  --  30.5 30.6 29.3* 29.5* 29.4*  RDW 12.8  --  12.9 12.9 12.9 12.9 12.8  LYMPHSABS 1.0  --   --   --  0.6* 1.0 1.4  MONOABS 1.6*  --   --   --  1.1* 1.5* 1.6*  EOSABS 0.0  --   --   --  0.0 0.0 0.0  BASOSABS 0.1  --   --   --  0.0 0.0 0.0   < > = values in this interval not displayed.    Electrolytes Recent Labs  Lab 06/29/22 2057 06/29/22 2350 06/30/22 0251 06/30/22 0451 07/01/22 0207 07/02/22 0122 07/03/22 0221 07/04/22 0049  NA 131*  --    < > 133* 137 140 141 141  K 3.4*  --    < > 3.6 3.7 4.1 4.8 4.2  CL 85*  --   --  91* 94* 96* 92* 90*  CO2 33*  --   --  32 33* 35* 41* 44*  GLUCOSE 152*  --   --  212* 155* 129* 96 95  BUN 26*  --   --  25* 30* '23 20 21  '$ CREATININE 1.29*  --   --  1.12* 0.94 0.83 0.67 0.91  CALCIUM 8.2*  --   --  7.8* 8.4* 8.5* 8.6* 8.4*  AST 24  --   --  21  --   --   --   --   ALT 16  --   --  15  --   --   --   --   ALKPHOS 65  --   --  54  --   --   --   --   BILITOT 0.4  --   --  0.4  --   --   --   --   ALBUMIN 3.2*  --   --  2.6*  --   --   --   --   MG  --   --   --  2.3 2.4  --   --  1.9  PROCALCITON  --   --   --   --   --   --   --  0.58  LATICACIDVEN 1.4 1.4  --   --   --   --   --   --   INR 1.2  --   --   --   --   --   --   --   TSH  --   --   --   --   --   --   --  1.060  HGBA1C  --   --   --   --  5.4  --   --   --    < > = values in this interval not displayed.    ------------------------------------------------------------------------------------------------------------------ No results for input(s): "CHOL", "HDL", "LDLCALC", "TRIG", "CHOLHDL", "LDLDIRECT" in the last 72 hours.  Lab Results  Component Value Date   HGBA1C 5.4 07/01/2022    Recent Labs    07/04/22 0049  TSH 1.060    Radiology Reports MR Lumbar Spine W Wo Contrast  Result Date: 07/03/2022 CLINICAL DATA:   Physiologic. EXAM: MRI LUMBAR SPINE WITHOUT AND WITH CONTRAST TECHNIQUE: Multiplanar and multiecho pulse sequences of the lumbar spine were obtained without and with intravenous contrast. CONTRAST:  7.43m GADAVIST GADOBUTROL 1 MMOL/ML IV SOLN COMPARISON:  CT abdomen pelvis dated June 30, 2022. FINDINGS: Segmentation:  Standard. Alignment: Unchanged trace stepwise retrolisthesis from L1-L2 through L4-L5. Vertebrae:  No fracture, evidence of discitis, or bone lesion. Conus medullaris and cauda equina: Conus extends to the L1-L2 level. Conus and cauda equina appear normal. No abnormal intrathecal enhancement. Paraspinal and other soft tissues: Negative. Known left pyelonephritis is better evaluated on CT. Disc levels: T12-L1:  Negative. L1-L2:  Mild disc bulging.  No stenosis. L2-L3: Mild disc bulging and left facet arthropathy. Mild left lateral recess stenosis. No spinal canal or neuroforaminal stenosis. L3-L4: Mild disc bulging and bilateral facet arthropathy. Mild bilateral lateral recess stenosis.  No spinal canal or neuroforaminal stenosis. L4-L5: Mild-to-moderate disc bulging eccentric to the left with left far lateral disc osteophyte complex. Mild bilateral facet arthropathy. Moderate left lateral recess stenosis. No spinal canal or neuroforaminal stenosis. L5-S1: Mild disc bulging with superimposed small central disc protrusion. Mild bilateral facet arthropathy. Moderate bilateral neuroforaminal stenosis. No spinal canal stenosis. IMPRESSION: 1. No acute abnormality.  No osteomyelitis discitis. 2. Multilevel lumbar spondylosis as described above. Moderate bilateral neuroforaminal stenosis at L5-S1. Electronically Signed   By: Titus Dubin M.D.   On: 07/03/2022 18:10   DG Abd 1 View  Result Date: 07/03/2022 CLINICAL DATA:  Abdominal pain. EXAM: ABDOMEN - 1 VIEW COMPARISON:  CT, 06/30/2022. FINDINGS: Normal bowel gas pattern.  No evidence of renal or ureteral stones. No acute skeletal abnormality.  IMPRESSION: No acute findings. Electronically Signed   By: Lajean Manes M.D.   On: 07/03/2022 10:08   CT Angio Chest Pulmonary Embolism (PE) W or WO Contrast  Result Date: 07/02/2022 CLINICAL DATA:  Sepsis due to left pyelonephritis. Acute on chronic respiratory failure. EXAM: CT ANGIOGRAPHY CHEST WITH CONTRAST TECHNIQUE: Multidetector CT imaging of the chest was performed using the standard protocol during bolus administration of intravenous contrast. Multiplanar CT image reconstructions and MIPs were obtained to evaluate the vascular anatomy. RADIATION DOSE REDUCTION: This exam was performed according to the departmental dose-optimization program which includes automated exposure control, adjustment of the mA and/or kV according to patient size and/or use of iterative reconstruction technique. CONTRAST:  169m OMNIPAQUE IOHEXOL 350 MG/ML SOLN COMPARISON:  CT chest dated June 30, 2022. FINDINGS: Cardiovascular: Satisfactory opacification of the pulmonary arteries to the segmental level. No evidence of pulmonary embolism. Unchanged mild cardiomegaly. No pericardial effusion. No thoracic aortic aneurysm or dissection. Coronary, aortic arch, and branch vessel atherosclerotic vascular disease. Mediastinum/Nodes: Borderline enlarged right hilar lymph node measuring up to 9 mm in short axis, unchanged. No enlarged mediastinal or axillary lymph nodes. The thyroid gland and esophagus demonstrate no significant findings. Lungs/Pleura: Small amount of secretions in the trachea. New trace left pleural effusion. Increased clustered ill-defined ground-glass nodularity in the superior segment of the left lower lobe (series 8, image 39). Unchanged 8 mm ground-glass nodule in the left lower lobe (series 8, image 68). Scattered tiny centrilobular nodules in both lungs again noted. Increased subsegmental atelectasis in the left lower lobe. Similar scarring and architectural distortion with volume loss in the right lung. Chronic  right pleural thickening and calcification again noted. Moderate centrilobular emphysema. Upper Abdomen: Unchanged patchy enhancement of the left kidney. Unchanged 1.5 cm left adrenal benign adenoma. No follow-up imaging is recommended. Musculoskeletal: No chest wall abnormality. No acute or significant osseous findings. Review of the MIP images confirms the above findings. IMPRESSION: 1. No evidence of pulmonary embolism. 2. Increased clustered ill-defined ground-glass nodularity in the superior segment of the left lower lobe, with otherwise similar scattered tiny centrilobular nodules in both, most lungs consistent with infectious or inflammatory bronchiolitis, possibly aspiration related given small amount of secretions in the trachea. 3. New trace left pleural effusion. 4. Unchanged 8 mm ground-glass nodule in the left lower lobe. Initial follow-up with CT at 6 months is recommended to confirm persistence. If persistent, repeat CT is recommended every 2 years until 5 years of stability has been established. This recommendation follows the consensus statement: Guidelines for Management of Incidental Pulmonary Nodules Detected on CT Images: From the Fleischner Society 2017; Radiology 2017; 284:228-243. 5. Unchanged left pyelonephritis. 6. Aortic Atherosclerosis (ICD10-I70.0) and Emphysema (ICD10-J43.9).  Electronically Signed   By: Titus Dubin M.D.   On: 07/02/2022 11:35   ECHOCARDIOGRAM COMPLETE  Result Date: 06/30/2022    ECHOCARDIOGRAM REPORT   Patient Name:   BRENLEE KOSKELA Date of Exam: 06/30/2022 Medical Rec #:  [id number]        Height:       63.0 in Accession #:    [id number]       Weight:       160.1 lb Date of Birth:  01-17-1951        BSA:          1.759 m Patient Age:    87 years         BP:           116/57 mmHg Patient Gender: F                HR:           84 bpm. Exam Location:  Inpatient Procedure: 2D Echo, Cardiac Doppler and Color Doppler Indications:    Elevated Troponin  History:         Patient has no prior history of Echocardiogram examinations.                 CHF, COPD; Risk Factors:Hypertension.  Sonographer:    Bernadene Person RDCS Referring Phys: [id number] [place]  1. Left ventricular ejection fraction, by estimation, is 55 to 60%. The left ventricle has normal function. The left ventricle has no regional wall motion abnormalities. Left ventricular diastolic parameters are consistent with Grade I diastolic dysfunction (impaired relaxation).  2. Right ventricular systolic function is normal. The right ventricular size is moderately enlarged. There is mildly elevated pulmonary artery systolic pressure. The estimated right ventricular systolic pressure is 99.2 mmHg.  3. Left atrial size was mildly dilated.  4. The mitral valve is normal in structure. No evidence of mitral valve regurgitation. No evidence of mitral stenosis.  5. Tricuspid valve regurgitation is moderate.  6. The aortic valve is normal in structure. Aortic valve regurgitation is not visualized. No aortic stenosis is present.  7. The inferior vena cava is normal in size with greater than 50% respiratory variability, suggesting right atrial pressure of 3 mmHg. FINDINGS  Left Ventricle: Left ventricular ejection fraction, by estimation, is 55 to 60%. The left ventricle has normal function. The left ventricle has no regional wall motion abnormalities. The left ventricular internal cavity size was normal in size. There is  no left ventricular hypertrophy. Left ventricular diastolic parameters are consistent with Grade I diastolic dysfunction (impaired relaxation). Right Ventricle: The right ventricular size is moderately enlarged. No increase in right ventricular wall thickness. Right ventricular systolic function is normal. There is mildly elevated pulmonary artery systolic pressure. The tricuspid regurgitant velocity is 3.22 m/s, and with an assumed right atrial pressure of 3 mmHg, the estimated right ventricular  systolic pressure is 42.6 mmHg. Left Atrium: Left atrial size was mildly dilated. Right Atrium: Right atrial size was normal in size. Pericardium: There is no evidence of pericardial effusion. Mitral Valve: The mitral valve is normal in structure. There is mild thickening of the mitral valve leaflet(s). There is mild calcification of the mitral valve leaflet(s). Mild mitral annular calcification. No evidence of mitral valve regurgitation. No evidence of mitral valve stenosis. Tricuspid Valve: The tricuspid valve is normal in structure. Tricuspid valve regurgitation is moderate . No evidence of tricuspid stenosis. Aortic Valve: The aortic valve is normal in structure. Aortic  valve regurgitation is not visualized. No aortic stenosis is present. Pulmonic Valve: The pulmonic valve was normal in structure. Pulmonic valve regurgitation is not visualized. No evidence of pulmonic stenosis. Aorta: The aortic root is normal in size and structure. Venous: The inferior vena cava is normal in size with greater than 50% respiratory variability, suggesting right atrial pressure of 3 mmHg. IAS/Shunts: No atrial level shunt detected by color flow Doppler.  LEFT VENTRICLE PLAX 2D LVIDd:         5.00 cm      Diastology LVIDs:         3.10 cm      LV e' medial:    5.33 cm/s LV PW:         0.80 cm      LV E/e' medial:  23.1 LV IVS:        0.90 cm      LV e' lateral:   5.85 cm/s LVOT diam:     1.80 cm      LV E/e' lateral: 21.0 LV SV:         55 LV SV Index:   31 LVOT Area:     2.54 cm  LV Volumes (MOD) LV vol d, MOD A2C: 120.0 ml LV vol d, MOD A4C: 67.1 ml LV vol s, MOD A2C: 56.5 ml LV vol s, MOD A4C: 23.8 ml LV SV MOD A2C:     63.5 ml LV SV MOD A4C:     67.1 ml LV SV MOD BP:      59.7 ml RIGHT VENTRICLE RV S prime:     10.70 cm/s TAPSE (M-mode): 2.6 cm LEFT ATRIUM             Index        RIGHT ATRIUM           Index LA diam:        6.20 cm 3.52 cm/m   RA Area:     15.90 cm LA Vol (A2C):   64.5 ml 36.67 ml/m  RA Volume:   52.10 ml   29.62 ml/m LA Vol (A4C):   61.0 ml 34.68 ml/m LA Biplane Vol: 66.7 ml 37.92 ml/m  AORTIC VALVE LVOT Vmax:   107.00 cm/s LVOT Vmean:  70.600 cm/s LVOT VTI:    0.215 m  AORTA Ao Root diam: 2.90 cm Ao Asc diam:  2.90 cm MITRAL VALVE                TRICUSPID VALVE MV Area (PHT): 4.06 cm     TR Peak grad:   41.5 mmHg MV Decel Time: 187 msec     TR Vmax:        322.00 cm/s MV E velocity: 123.00 cm/s MV A velocity: 127.00 cm/s  SHUNTS MV E/A ratio:  0.97         Systemic VTI:  0.22 m                             Systemic Diam: 1.80 cm Candee Furbish MD Electronically signed by Candee Furbish MD Signature Date/Time: 06/30/2022/12:03:16 PM    Final

## 2022-07-04 NOTE — Evaluation (Signed)
Physical Therapy Evaluation Patient Details Name: [name] MRN: [id number] DOB: 18-Dec-1951 Today's Date: 07/04/2022  History of Present Illness  Pt is a 71 y/o female admitted secondary to fever and confusion, found to have sepsis and E. Coli. PMH including but not limited to HTN, HLD, COPD on home 02, urinary retention on Flomax.  Clinical Impression  Pt presented supine in bed with HOB elevated, awake and willing to participate in therapy session. Prior to admission, pt reported that she was independent with all functional mobility and ADLs/IADLs. Pt lives with her daughter in a single level home with several steps to enter. At the time of evaluation, pt very limited with mobility secondary to weakness, fatigue and activity tolerance. She was able to complete bed mobility with supervision and transfers with min guard-min A for stability and safety. She was on 3L of O2 throughout with SpO2 decreasing to 87% with activity, with slow recovery to low 90's with seated rest. Pt is very vocal about wanting to go to rehab/SNF to increase her overall strength prior to returning home. Pt would continue to benefit from skilled physical therapy services at this time while admitted and after d/c to address the below listed limitations in order to improve overall safety and independence with functional mobility.       Recommendations for follow up therapy are one component of a multi-disciplinary discharge planning process, led by the attending physician.  Recommendations may be updated based on patient status, additional functional criteria and insurance authorization.  Follow Up Recommendations Skilled nursing-short term rehab (<3 hours/day) Can patient physically be transported by private vehicle: Yes    Assistance Recommended at Discharge Intermittent Supervision/Assistance  Patient can return home with the following  A little help with walking and/or transfers;A little help with  bathing/dressing/bathroom;Help with stairs or ramp for entrance;Assist for transportation;Assistance with cooking/housework    Equipment Recommendations Other (comment) (defer to next venue of care)  Recommendations for Other Services       Functional Status Assessment Patient has had a recent decline in their functional status and demonstrates the ability to make significant improvements in function in a reasonable and predictable amount of time.     Precautions / Restrictions Precautions Precautions: Fall Precaution Comments: monitor SpO2 Restrictions Weight Bearing Restrictions: No      Mobility  Bed Mobility Overal bed mobility: Needs Assistance Bed Mobility: Supine to Sit     Supine to sit: Supervision     General bed mobility comments: increased time and effort, HOB elevated, supervision for safety, no physical assist needed    Transfers Overall transfer level: Needs assistance Equipment used: None Transfers: Sit to/from Stand, Bed to chair/wheelchair/BSC Sit to Stand: Min guard Stand pivot transfers: Min assist         General transfer comment: increased time and effort, pt performed sit<>stand from EOB x2 and then pivoted to recliner chair on her right side, min A for stability    Ambulation/Gait               General Gait Details: pt repeatedly stating she is "too weak" at this time (?Self-limiting)  Stairs            Wheelchair Mobility    Modified Rankin (Stroke Patients Only)       Balance Overall balance assessment: Needs assistance Sitting-balance support: Feet supported Sitting balance-Leahy Scale: Good     Standing balance support: During functional activity, No upper extremity supported Standing balance-Leahy Scale: Fair  Pertinent Vitals/Pain Pain Assessment Pain Assessment: No/denies pain    Home Living Family/patient expects to be discharged to:: Private residence Living  Arrangements: Children Available Help at Discharge: Family;Available 24 hours/day Type of Home: House Home Access: Stairs to enter Entrance Stairs-Rails: Psychiatric nurse of Steps: 6   Home Layout: One level Home Equipment: Shower seat Additional Comments: on 2L of supplemental O2 at all times at home; pt lives with daughter who has a hx of CVA with hemiparesis per pt. Pt stated that she does not have to assist her daughter with any mobility or ADLs/IADLs. Both drive    Prior Function Prior Level of Function : Independent/Modified Independent;Driving                     Hand Dominance        Extremity/Trunk Assessment   Upper Extremity Assessment Upper Extremity Assessment: Defer to OT evaluation    Lower Extremity Assessment Lower Extremity Assessment: Generalized weakness       Communication   Communication: No difficulties  Cognition Arousal/Alertness: Awake/alert Behavior During Therapy: Anxious Overall Cognitive Status: Within Functional Limits for tasks assessed                                 General Comments: pt reported hx of claustraphobia        General Comments      Exercises     Assessment/Plan    PT Assessment Patient needs continued PT services  PT Problem List Decreased strength;Decreased activity tolerance;Decreased balance;Decreased mobility;Decreased coordination;Decreased knowledge of use of DME;Decreased safety awareness;Decreased knowledge of precautions;Cardiopulmonary status limiting activity       PT Treatment Interventions DME instruction;Gait training;Stair training;Functional mobility training;Therapeutic activities;Therapeutic exercise;Balance training;Neuromuscular re-education;Patient/family education    PT Goals (Current goals can be found in the Care Plan section)  Acute Rehab PT Goals Patient Stated Goal: to get stronger at rehab before going home PT Goal Formulation: With patient Time  For Goal Achievement: 07/18/22 Potential to Achieve Goals: Good    Frequency Min 2X/week     Co-evaluation               AM-PAC PT "6 Clicks" Mobility  Outcome Measure Help needed turning from your back to your side while in a flat bed without using bedrails?: A Little Help needed moving from lying on your back to sitting on the side of a flat bed without using bedrails?: A Little Help needed moving to and from a bed to a chair (including a wheelchair)?: A Little Help needed standing up from a chair using your arms (e.g., wheelchair or bedside chair)?: A Little Help needed to walk in hospital room?: A Lot Help needed climbing 3-5 steps with a railing? : A Lot 6 Click Score: 16    End of Session Equipment Utilized During Treatment: Oxygen (3L of O2) Activity Tolerance: Patient limited by fatigue Patient left: in chair;with call bell/phone within reach Nurse Communication: Mobility status PT Visit Diagnosis: Other abnormalities of gait and mobility (R26.89);Muscle weakness (generalized) (M62.81)    Time: 0350-0938 PT Time Calculation (min) (ACUTE ONLY): 26 min   Charges:   PT Evaluation $PT Eval Moderate Complexity: 1 Mod PT Treatments $Therapeutic Activity: 8-22 mins        Anastasio Champion, DPT  Acute Rehabilitation Services Office [place] 07/04/2022, 9:40 AM

## 2022-07-04 NOTE — Progress Notes (Signed)
[name] presented for a nuclear stress test today.  No immediate complications.  Stress imaging is pending at this time.   Preliminary EKG findings may be listed in the chart, but the stress test result will not be finalized until perfusion imaging is complete.   [place], [place]  07/04/2022 2:01 PM

## 2022-07-04 NOTE — Progress Notes (Addendum)
    I have personally reviewed the stress test along with Dr. Audie Box and agree that the fixed defect noted is likely artifact, probably from motion. No reversible ischemia is noted. The LVEF is lower d/t gating error - the echo is considered more accurate. Would not recommend further coronary evaluation at this time. Ok to d/c home from our standpoint.  Would recommend consolidation of diltiazem to 180 mg CD daily.  Follow-up with Dr. Harrington Challenger after discharge.  Pixie Casino, MD, [facility], [place] Director of the Advanced Lipid Disorders &  Cardiovascular Risk Reduction Clinic Diplomate of the American Board of Clinical Lipidology Attending Cardiologist  Direct Dial: [phone]  Fax: [phone]  Website:  [email or web address]

## 2022-07-04 NOTE — Care Management Important Message (Signed)
Important Message  Patient Details  Name: [name] MRN: [id number] Date of Birth: 24-Dec-1951   Medicare Important Message Given:  Yes     Orbie Pyo 07/04/2022, 3:59 PM

## 2022-07-04 NOTE — Progress Notes (Signed)
OT Cancellation Note  Patient Details Name: [name] MRN: [id number] DOB: 11-24-1951   Cancelled Treatment:    Reason Eval/Treat Not Completed: Patient at procedure or test/ unavailable (Pt at stress test.)  Malka So 07/04/2022, 11:54 AM Cleta Alberts, OTR/L Acute Rehabilitation Services Office: [phone]

## 2022-07-05 ENCOUNTER — Inpatient Hospital Stay (HOSPITAL_COMMUNITY): Payer: Medicare Other

## 2022-07-05 DIAGNOSIS — J9621 Acute and chronic respiratory failure with hypoxia: Secondary | ICD-10-CM | POA: Diagnosis not present

## 2022-07-05 DIAGNOSIS — J9622 Acute and chronic respiratory failure with hypercapnia: Secondary | ICD-10-CM | POA: Diagnosis not present

## 2022-07-05 LAB — BASIC METABOLIC PANEL
Anion gap: 12 (ref 5–15)
BUN: 19 mg/dL (ref 8–23)
CO2: 42 mmol/L — ABNORMAL HIGH (ref 22–32)
Calcium: 8.3 mg/dL — ABNORMAL LOW (ref 8.9–10.3)
Chloride: 87 mmol/L — ABNORMAL LOW (ref 98–111)
Creatinine, Ser: 0.86 mg/dL (ref 0.44–1.00)
GFR, Estimated: >60 mL/min (ref >60)
Glucose, Bld: 93 mg/dL (ref 70–99)
Potassium: 4.2 mmol/L (ref 3.5–5.1)
Sodium: 141 mmol/L (ref 135–145)

## 2022-07-05 LAB — CBC WITH DIFFERENTIAL/PLATELET
Abs Immature Granulocytes: 0.52 10*3/uL — ABNORMAL HIGH (ref 0.00–0.07)
Basophils Absolute: 0.1 10*3/uL (ref 0.0–0.1)
Basophils Relative: 0 %
Eosinophils Absolute: 0.2 10*3/uL (ref 0.0–0.5)
Eosinophils Relative: 2 %
HCT: 32.7 % — ABNORMAL LOW (ref 36.0–46.0)
Hemoglobin: 10 g/dL — ABNORMAL LOW (ref 12.0–15.0)
Immature Granulocytes: 4 %
Lymphocytes Relative: 13 %
Lymphs Abs: 1.8 10*3/uL (ref 0.7–4.0)
MCH: 29.2 pg (ref 26.0–34.0)
MCHC: 30.6 g/dL (ref 30.0–36.0)
MCV: 95.3 fL (ref 80.0–100.0)
Monocytes Absolute: 1.6 10*3/uL — ABNORMAL HIGH (ref 0.1–1.0)
Monocytes Relative: 12 %
Neutro Abs: 9.4 10*3/uL — ABNORMAL HIGH (ref 1.7–7.7)
Neutrophils Relative %: 69 %
Platelets: 184 10*3/uL (ref 150–400)
RBC: 3.43 MIL/uL — ABNORMAL LOW (ref 3.87–5.11)
RDW: 13 % (ref 11.5–15.5)
WBC: 13.6 10*3/uL — ABNORMAL HIGH (ref 4.0–10.5)
nRBC: 0 % (ref 0.0–0.2)

## 2022-07-05 LAB — MAGNESIUM: Magnesium: 1.8 mg/dL (ref 1.7–2.4)

## 2022-07-05 LAB — PROCALCITONIN: Procalcitonin: 0.29 ng/mL

## 2022-07-05 LAB — BRAIN NATRIURETIC PEPTIDE: B Natriuretic Peptide: 615 pg/mL — ABNORMAL HIGH (ref 0.0–100.0)

## 2022-07-05 MED ORDER — FUROSEMIDE 10 MG/ML IJ SOLN
40.0000 mg | Freq: Once | INTRAMUSCULAR | Status: AC
  Administered 2022-07-05: 40 mg via INTRAVENOUS
  Filled 2022-07-05: qty 4

## 2022-07-05 MED ORDER — DOXYCYCLINE HYCLATE 100 MG PO TABS
100.0000 mg | ORAL_TABLET | Freq: Two times a day (BID) | ORAL | Status: DC
  Administered 2022-07-05 – 2022-07-07 (×5): 100 mg via ORAL
  Filled 2022-07-05 (×5): qty 1

## 2022-07-05 MED ORDER — LOSARTAN POTASSIUM 50 MG PO TABS
25.0000 mg | ORAL_TABLET | Freq: Every day | ORAL | Status: DC
  Administered 2022-07-05: 25 mg via ORAL
  Filled 2022-07-05 (×2): qty 1

## 2022-07-05 NOTE — TOC Initial Note (Addendum)
Transition of Care [facility]) - Initial/Assessment Note    Patient Details  Name: [name] MRN: [id number] Date of Birth: [birth year]  Transition of Care [facility]) CM/SW Contact:    Barton Fanny, [place] Work Phone Number: 07/05/2022, 2:02 PM  Clinical Narrative:                 12pm: MSW intern spoke with patient at bedside to discuss possibly discharging to SNF. Patient was very agreeable and stated she wished to go to [facility]. Patient also requested MSW intern return once her daughter was at bedside later this afternoon.  4pm: MSW intern and CSW returned to bedside hoping to speak with daughter, but daughter had just left the hospital. CSW and MSW intern were able to go over SNF list in further detail. Patient stated she will ask her daughter to come back up tomorrow and speak with CSW in regards to SNF discharge plan.  Patient requesting not to discharge until Thursday as she reported being tired. CSW explained that is up to MD.  CSW initiated insurance process with [facility] pending a SNF facility choice, Ref# [id number].  Expected Discharge Plan: Skilled Nursing Facility Barriers to Discharge: Continued Medical Work up   Patient Goals and CMS Choice Patient states their goals for this hospitalization and ongoing recovery are:: SNF      Expected Discharge Plan and Services Expected Discharge Plan: [place]                                              Prior Living Arrangements/Services   Lives with:: Adult Children Patient language and need for interpreter reviewed:: Yes Do you feel safe going back to the place where you live?: Yes      Need for Family Participation in Patient Care: Yes (Comment) Care giver support system in place?: Yes (comment)   Criminal Activity/Legal Involvement Pertinent to Current Situation/Hospitalization: No - Comment as needed  Activities of Daily Living Home Assistive Devices/Equipment: Blood pressure cuff,  Shower chair with back, Grab bars in shower, Bedside commode/3-in-1, Eyeglasses, Hand-held shower hose, Nebulizer, Oxygen, Raised toilet seat with rails ADL Screening (condition at time of admission) Patient's cognitive ability adequate to safely complete daily activities?: Yes Is the patient deaf or have difficulty hearing?: No Does the patient have difficulty seeing, even when wearing glasses/contacts?: Yes Does the patient have difficulty concentrating, remembering, or making decisions?: No Patient able to express need for assistance with ADLs?: Yes Does the patient have difficulty dressing or bathing?: No Independently performs ADLs?: Yes (appropriate for developmental age) Does the patient have difficulty walking or climbing stairs?: Yes Weakness of Legs: None Weakness of Arms/Hands: None  Permission Sought/Granted                  Emotional Assessment Appearance:: Appears stated age Attitude/Demeanor/Rapport: Engaged Affect (typically observed): Accepting Orientation: : Oriented to Self, Oriented to Place, Oriented to  Time, Oriented to Situation Alcohol / Substance Use: Not Applicable    Admission diagnosis:  Elevated troponin [R77.8] Sepsis ([place]) [A41.9] Altered mental status, unspecified altered mental status type [R41.82] Sepsis, due to unspecified organism, unspecified whether acute organ dysfunction present [facility]) [A41.9] Patient Active Problem List   Diagnosis Date Noted   Sepsis ([place]) 06/30/2022   Acute pyelonephritis 06/30/2022   E coli bacteremia 73/41/9379   Acute metabolic encephalopathy 02/40/9735  Acute on chronic respiratory failure with hypoxia and hypercapnia (HCC) 06/30/2022   Sepsis secondary to UTI ([place]) 06/30/2022   Troponin level elevated    COPD exacerbation ([place]) 06/15/2021   Hypertension    Anxiety    CHF (congestive heart failure) (HCC)    Therapeutic opioid-induced constipation ([facility]) 05/27/2021   Urinary retention 07/01/2020    Hematochezia 11/09/2018   Presence of left artificial elbow joint 06/29/2017   Closed displaced fracture of head of radius with routine healing 06/29/2017   Urinary urgency 01/25/2016   Blood in urine 10/09/2015   Vaginal bleeding 10/09/2015   Atrophic vaginitis 10/09/2015   Hematuria 10/09/2015   GERD (gastroesophageal reflux disease) 09/04/2014   Macrocytic anemia 09/04/2014   Constipation 07/14/2014   Nicotine addiction 04/07/2014   PCP:  Celene Squibb, MD Pharmacy:   [facility] [place], [address] [place] [address] Phone: [phone] Fax: [phone]     Social Determinants of Health (SDOH) Interventions    Readmission Risk Interventions     No data to display

## 2022-07-05 NOTE — Progress Notes (Signed)
PROGRESS NOTE                                                                                                                                                                                                             Patient Demographics:    [name], is a 71 y.o. female, DOB - September 24, 1951, OEV:[id number]  Outpatient Primary MD for the patient is Celene Squibb, MD    LOS - 5  Admit date - 06/29/2022    Chief Complaint  Patient presents with   Shortness of Breath   Chest Pain   Altered Mental Status       Brief Narrative (HPI from H&P)   71 year old female with history of HTN, HLD, COPD on home 02, urinary retention on Flomax,  presents with  confusion and fever.  She was diagnosed with urinary retention causing UTI and E. coli bacteremia along with sepsis.   Subjective:   Patient in bed, appears comfortable, denies any headache, no fever, no chest pain or pressure, +ve orthopnea and shortness of breath , no abdominal pain. No new focal weakness.   Assessment  & Plan :   Urinary retention causing pyelonephritis, sepsis present on admission, E. coli bacteremia.  Stable.  On IV cefazolin, sepsis pathophysiology has resolved. She has been placed on Flomax, Foley inserted, overall much improved, will give total of 7 to 10 days of antibiotics.  Sepsis pathophysiology clinically has resolved.  Will check renal ultrasound to make sure there is no high obstruction.  Toxic encephalopathy.  Due to #1 above.  Resolved.  Acute on chronic diastolic CHF.  Evidence of fluid overload on exam.  Lasix on 07/05/2022 and monitor.  Advance activity and titrate down oxygen.    Acute on chronic hypoxic respiratory failure due to COPD exacerbation.  Stable CTA, support supportive care improving.  Taper off prednisone, increase activity and titrate down oxygen.   Hypertension.  On ARB and Cardizem per cardiology, Cardizem dose increased for  better control.  Dyslipidemia.  On statin   Nonspecific chest pain and mild elevation in troponin.  Question NSTEMI.  Seen by cardiology, echo stable with preserved EF, chest pain-free, Cardiolite done on 07/04/2022 does not show any reversible ischemia.  On Cardizem and statin for secondary prevention per cardiology continue.  8 mm ground-glass nodule in the left lower lobe.  PCP to arrange outpatient pulmonary follow-up within 1 to 2 weeks of discharge for follow-up and monitoring.  AKI.  Due to sepsis and urinary retention.  Resolved after hydration and IV fluids.  Chronic low back pain.  Nonacute MRI L-spine.  Chronic normocytic anemia.  Stable.        Condition -  Fair  Family Communication  :  None  Code Status :  Full  Consults  :  Cards  PUD Prophylaxis :    Procedures  :     CTA Chest - 1. No evidence of pulmonary embolism. 2. Increased clustered ill-defined ground-glass nodularity in the superior segment of the left lower lobe, with otherwise similar scattered tiny centrilobular nodules in both, most lungs consistent with infectious or inflammatory bronchiolitis, possibly aspiration related given small amount of secretions in the trachea. 3. New trace left pleural effusion. 4. Unchanged 8 mm ground-glass nodule in the left lower lobe. Initial follow-up with CT at 6 months is recommended to confirm persistence. If persistent, repeat CT is recommended every 2 years until 5 years of stability has been established.  TTE -    1. Left ventricular ejection fraction, by estimation, is 55 to 60%. The left ventricle has normal function. The left ventricle has no regional wall motion abnormalities. Left ventricular diastolic parameters are consistent with Grade I diastolic dysfunction (impaired relaxation).  2. Right ventricular systolic function is normal. The right ventricular size is moderately enlarged. There is mildly elevated pulmonary artery systolic pressure. The estimated  right ventricular systolic pressure is 33.8 mmHg.  3. Left atrial size was mildly dilated.  4. The mitral valve is normal in structure. No evidence of mitral valve regurgitation. No evidence of mitral stenosis.  5. Tricuspid valve regurgitation is moderate.  6. The aortic valve is normal in structure. Aortic valve regurgitation is not visualized. No aortic stenosis is present.  7. The inferior vena cava is normal in size with greater than 50% respiratory variability, suggesting right atrial pressure of 3 mmHg   MRI L Spine - 1. No acute abnormality.  No osteomyelitis discitis. 2. Multilevel lumbar spondylosis as described above. Moderate bilateral neuroforaminal stenosis at L5-S1  CT Abd - Pelvis -  Patchy enhancement in the left kidney consistent with pyelonephritis. Minimal fullness of the proximal ureter is seen although no obstructing lesion is noted.  Diverticulosis without diverticulitis.  Stable left adrenal lesion dating back to 2014 consistent with  adenoma.  Cardiolite stress test.  No reversible ischemia.         Disposition Plan  :    Status is: Inpatient  DVT Prophylaxis  :    enoxaparin (LOVENOX) injection 40 mg Start: 07/02/22 1000    Lab Results  Component Value Date   PLT 184 07/05/2022    Diet :  Diet Order             Diet Heart Room service appropriate? Yes; Fluid consistency: Thin  Diet effective now                    Inpatient Medications  Scheduled Meds:  atorvastatin  80 mg Oral Daily   Chlorhexidine Gluconate Cloth  6 each Topical Daily   diltiazem  60 mg Oral Q8H   enoxaparin (LOVENOX) injection  40 mg Subcutaneous Q24H   fluticasone furoate-vilanterol  1 puff Inhalation Daily   And   umeclidinium bromide  1 puff Inhalation Daily   lidocaine  1 patch Transdermal Q24H   LORazepam  0.5 mg Intravenous Once   losartan  25 mg Oral Daily   pantoprazole  20 mg Oral Daily   polyethylene glycol  17 g Oral Daily   senna-docusate  1 tablet Oral  QHS   tamsulosin  0.4 mg Oral QPC supper   cyanocobalamin  1,000 mcg Oral Daily   Continuous Infusions:   ceFAZolin (ANCEF) IV 2 g (07/05/22 0637)   PRN Meds:.albuterol, clonazePAM, hydrALAZINE, HYDROcodone-acetaminophen, melatonin, ondansetron (ZOFRAN) IV  Time Spent in minutes  30   Lala Lund M.D on 07/05/2022 at 10:55 AM  To page go to [email or web address]   [facility] -  Office  [phone]  See all Orders from today for further details    Objective:   Vitals:   07/05/22 0202 07/05/22 0635 07/05/22 0806 07/05/22 0904  BP: 140/67 (!) 147/62  140/67  Pulse: 85   92  Resp: 19   17  Temp: 98.7 F (37.1 C)   97.9 F (36.6 C)  TempSrc: Oral   Oral  SpO2: 96%  96% 94%  Weight:      Height:        Wt Readings from Last 3 Encounters:  07/04/22 82.7 kg  06/15/21 72.6 kg  03/25/21 71.2 kg    No intake or output data in the 24 hours ending 07/05/22 1055    Physical Exam  Awake Alert, No new F.N deficits, Normal affect [place].AT,PERRAL Supple Neck, No JVD,   Symmetrical Chest wall movement, Good air movement bilaterally, +ve rales RRR,No Gallops, Rubs or new Murmurs,  +ve B.Sounds, Abd Soft, No tenderness,   No Cyanosis, Clubbing or edema     Data Review:    CBC Recent Labs  Lab 06/29/22 2057 06/30/22 0251 07/01/22 0207 07/02/22 0122 07/03/22 0221 07/04/22 0049 07/05/22 0231  WBC 22.2*   < > 16.8* 12.6* 10.6* 10.7* 13.6*  HGB 10.3*   < > 9.4* 8.8* 9.0* 9.3* 10.0*  HCT 34.3*   < > 30.7* 30.0* 30.5* 31.6* 32.7*  PLT 160   < > 165 180 177 191 184  MCV 97.2   < > 95.6 97.7 98.4 96.6 95.3  MCH 29.2   < > 29.3 28.7 29.0 28.4 29.2  MCHC 30.0   < > 30.6 29.3* 29.5* 29.4* 30.6  RDW 12.8   < > 12.9 12.9 12.9 12.8 13.0  LYMPHSABS 1.0  --   --  0.6* 1.0 1.4 1.8  MONOABS 1.6*  --   --  1.1* 1.5* 1.6* 1.6*  EOSABS 0.0  --   --  0.0 0.0 0.0 0.2  BASOSABS 0.1  --   --  0.0 0.0 0.0 0.1   < > = values in this interval not displayed.    Electrolytes Recent  Labs  Lab 06/29/22 2057 06/29/22 2350 06/30/22 0251 06/30/22 0451 07/01/22 0207 07/02/22 0122 07/03/22 0221 07/04/22 0049 07/05/22 0231  NA 131*  --    < > 133* 137 140 141 141 141  K 3.4*  --    < > 3.6 3.7 4.1 4.8 4.2 4.2  CL 85*  --   --  91* 94* 96* 92* 90* 87*  CO2 33*  --   --  32 33* 35* 41* 44* 42*  GLUCOSE 152*  --   --  212* 155* 129* 96 95 93  BUN 26*  --   --  25* 30* '23 20 21 19  '$ CREATININE 1.29*  --   --  1.12* 0.94 0.83 0.67 0.91 0.86  CALCIUM 8.2*  --   --  7.8* 8.4* 8.5* 8.6* 8.4* 8.3*  AST 24  --   --  21  --   --   --   --   --   ALT 16  --   --  15  --   --   --   --   --   ALKPHOS 65  --   --  54  --   --   --   --   --   BILITOT 0.4  --   --  0.4  --   --   --   --   --   ALBUMIN 3.2*  --   --  2.6*  --   --   --   --   --   MG  --   --   --  2.3 2.4  --   --  1.9 1.8  PROCALCITON  --   --   --   --   --   --   --  0.58 0.29  LATICACIDVEN 1.4 1.4  --   --   --   --   --   --   --   INR 1.2  --   --   --   --   --   --   --   --   TSH  --   --   --   --   --   --   --  1.060  --   HGBA1C  --   --   --   --  5.4  --   --   --   --   BNP  --   --   --   --   --   --   --   --  615.0*   < > = values in this interval not displayed.    Radiology Reports DG Chest Port 1 View  Result Date: 07/05/2022 CLINICAL DATA:  72 year old female with history of shortness of breath and low back pain. EXAM: PORTABLE CHEST 1 VIEW COMPARISON:  Chest x-ray 06/29/2022. FINDINGS: Extensive chronic scarring and volume loss in the right mid to lower lung, stable compared to prior examination. Left lung is clear. No definite pleural effusions. No pneumothorax. No evidence of pulmonary edema. Heart size is mildly enlarged. Atherosclerotic calcifications in the thoracic aorta. IMPRESSION: 1. No definite radiographic evidence of acute cardiopulmonary disease. 2. Mild cardiomegaly. 3. Extensive chronic scarring in the right mid to lower lung, similar to prior studies. Electronically Signed    By: Vinnie Langton M.D.   On: 07/05/2022 06:56   NM Myocar Multi W/Spect Tamela Oddi Motion / EF  Result Date: 07/04/2022   Poor quality study.  There is noticeable patient movement during the exam creating a defect completely horizontal to the left ventricle.  This defect does not match any coronary distribution.  The defect is fixed.  There is clear motion.  To me this is likely motion artifact.  Perfusion appears to be normal with no evidence of infarction.  LV perfusion is normal. There is no evidence of ischemia. There is no evidence of infarction.   Left ventricular function is abnormal. Nuclear stress EF: 43 %. The left ventricular ejection fraction is moderately decreased (30-44%). End diastolic cavity size is mildly enlarged.   The study is likely normal. The study is intermediate risk based on ejection fraction of 43%.  However significant cardiac motion artifact  noted.  Would defer to echocardiogram regarding accurate assessment.   MR Lumbar Spine W Wo Contrast  Result Date: 07/03/2022 CLINICAL DATA:  Physiologic. EXAM: MRI LUMBAR SPINE WITHOUT AND WITH CONTRAST TECHNIQUE: Multiplanar and multiecho pulse sequences of the lumbar spine were obtained without and with intravenous contrast. CONTRAST:  7.16m GADAVIST GADOBUTROL 1 MMOL/ML IV SOLN COMPARISON:  CT abdomen pelvis dated June 30, 2022. FINDINGS: Segmentation:  Standard. Alignment: Unchanged trace stepwise retrolisthesis from L1-L2 through L4-L5. Vertebrae:  No fracture, evidence of discitis, or bone lesion. Conus medullaris and cauda equina: Conus extends to the L1-L2 level. Conus and cauda equina appear normal. No abnormal intrathecal enhancement. Paraspinal and other soft tissues: Negative. Known left pyelonephritis is better evaluated on CT. Disc levels: T12-L1:  Negative. L1-L2:  Mild disc bulging.  No stenosis. L2-L3: Mild disc bulging and left facet arthropathy. Mild left lateral recess stenosis. No spinal canal or neuroforaminal stenosis.  L3-L4: Mild disc bulging and bilateral facet arthropathy. Mild bilateral lateral recess stenosis. No spinal canal or neuroforaminal stenosis. L4-L5: Mild-to-moderate disc bulging eccentric to the left with left far lateral disc osteophyte complex. Mild bilateral facet arthropathy. Moderate left lateral recess stenosis. No spinal canal or neuroforaminal stenosis. L5-S1: Mild disc bulging with superimposed small central disc protrusion. Mild bilateral facet arthropathy. Moderate bilateral neuroforaminal stenosis. No spinal canal stenosis. IMPRESSION: 1. No acute abnormality.  No osteomyelitis discitis. 2. Multilevel lumbar spondylosis as described above. Moderate bilateral neuroforaminal stenosis at L5-S1. Electronically Signed   By: WTitus DubinM.D.   On: 07/03/2022 18:10   DG Abd 1 View  Result Date: 07/03/2022 CLINICAL DATA:  Abdominal pain. EXAM: ABDOMEN - 1 VIEW COMPARISON:  CT, 06/30/2022. FINDINGS: Normal bowel gas pattern.  No evidence of renal or ureteral stones. No acute skeletal abnormality. IMPRESSION: No acute findings. Electronically Signed   By: DLajean ManesM.D.   On: 07/03/2022 10:08   CT Angio Chest Pulmonary Embolism (PE) W or WO Contrast  Result Date: 07/02/2022 CLINICAL DATA:  Sepsis due to left pyelonephritis. Acute on chronic respiratory failure. EXAM: CT ANGIOGRAPHY CHEST WITH CONTRAST TECHNIQUE: Multidetector CT imaging of the chest was performed using the standard protocol during bolus administration of intravenous contrast. Multiplanar CT image reconstructions and MIPs were obtained to evaluate the vascular anatomy. RADIATION DOSE REDUCTION: This exam was performed according to the departmental dose-optimization program which includes automated exposure control, adjustment of the mA and/or kV according to patient size and/or use of iterative reconstruction technique. CONTRAST:  1236mOMNIPAQUE IOHEXOL 350 MG/ML SOLN COMPARISON:  CT chest dated June 30, 2022. FINDINGS:  Cardiovascular: Satisfactory opacification of the pulmonary arteries to the segmental level. No evidence of pulmonary embolism. Unchanged mild cardiomegaly. No pericardial effusion. No thoracic aortic aneurysm or dissection. Coronary, aortic arch, and branch vessel atherosclerotic vascular disease. Mediastinum/Nodes: Borderline enlarged right hilar lymph node measuring up to 9 mm in short axis, unchanged. No enlarged mediastinal or axillary lymph nodes. The thyroid gland and esophagus demonstrate no significant findings. Lungs/Pleura: Small amount of secretions in the trachea. New trace left pleural effusion. Increased clustered ill-defined ground-glass nodularity in the superior segment of the left lower lobe (series 8, image 39). Unchanged 8 mm ground-glass nodule in the left lower lobe (series 8, image 68). Scattered tiny centrilobular nodules in both lungs again noted. Increased subsegmental atelectasis in the left lower lobe. Similar scarring and architectural distortion with volume loss in the right lung. Chronic right pleural thickening and calcification again noted. Moderate centrilobular emphysema. Upper Abdomen:  Unchanged patchy enhancement of the left kidney. Unchanged 1.5 cm left adrenal benign adenoma. No follow-up imaging is recommended. Musculoskeletal: No chest wall abnormality. No acute or significant osseous findings. Review of the MIP images confirms the above findings. IMPRESSION: 1. No evidence of pulmonary embolism. 2. Increased clustered ill-defined ground-glass nodularity in the superior segment of the left lower lobe, with otherwise similar scattered tiny centrilobular nodules in both, most lungs consistent with infectious or inflammatory bronchiolitis, possibly aspiration related given small amount of secretions in the trachea. 3. New trace left pleural effusion. 4. Unchanged 8 mm ground-glass nodule in the left lower lobe. Initial follow-up with CT at 6 months is recommended to confirm  persistence. If persistent, repeat CT is recommended every 2 years until 5 years of stability has been established. This recommendation follows the consensus statement: Guidelines for Management of Incidental Pulmonary Nodules Detected on CT Images: From the Fleischner Society 2017; Radiology 2017; 284:228-243. 5. Unchanged left pyelonephritis. 6. Aortic Atherosclerosis (ICD10-I70.0) and Emphysema (ICD10-J43.9). Electronically Signed   By: Titus Dubin M.D.   On: 07/02/2022 11:35

## 2022-07-05 NOTE — Evaluation (Signed)
Occupational Therapy Evaluation Patient Details Name: [name] MRN: [id number] DOB: Aug 08, 1951 Today's Date: 07/05/2022   History of Present Illness Pt is a 71 y/o female admitted secondary to fever and confusion, found to have sepsis and E. Coli. PMH including but not limited to HTN, HLD, COPD on home 02, urinary retention on Flomax.   Clinical Impression   Pt independent at baseline with ADLs and functional mobility, lives with daughter who can assist PRN. Pt currently needing set up -min A for ADLs, and min guard for mobility without AD. Pt with mild unsteadiness with ambulation and fatigues quickly. Pt presenting with impairments listed below, will follow acutely. Recommend SNF at d/c.      Recommendations for follow up therapy are one component of a multi-disciplinary discharge planning process, led by the attending physician.  Recommendations may be updated based on patient status, additional functional criteria and insurance authorization.   Follow Up Recommendations  Skilled nursing-short term rehab (<3 hours/day)    Assistance Recommended at Discharge Intermittent Supervision/Assistance  Patient can return home with the following Assistance with cooking/housework;Direct supervision/assist for medications management;Direct supervision/assist for financial management;Help with stairs or ramp for entrance;Assist for transportation;A little help with bathing/dressing/bathroom;A little help with walking and/or transfers    Functional Status Assessment  Patient has had a recent decline in their functional status and demonstrates the ability to make significant improvements in function in a reasonable and predictable amount of time.  Equipment Recommendations  None recommended by OT (defer to next venue of care)    Recommendations for Other Services PT consult     Precautions / Restrictions Precautions Precautions: Fall Precaution Comments: monitor SpO2 Restrictions Weight  Bearing Restrictions: No      Mobility Bed Mobility               General bed mobility comments: pt up in chair upon arrival    Transfers Overall transfer level: Needs assistance Equipment used: None Transfers: Sit to/from Stand Sit to Stand: Min guard           General transfer comment: min guard, using railing in hallway for assist      Balance Overall balance assessment: Needs assistance Sitting-balance support: Feet supported Sitting balance-Leahy Scale: Good     Standing balance support: During functional activity, No upper extremity supported Standing balance-Leahy Scale: Fair                             ADL either performed or assessed with clinical judgement   ADL Overall ADL's : Needs assistance/impaired Eating/Feeding: Set up;Sitting   Grooming: Set up;Sitting   Upper Body Bathing: Minimal assistance;Sitting   Lower Body Bathing: Minimal assistance;Sitting/lateral leans;Sit to/from stand   Upper Body Dressing : Minimal assistance;Sitting   Lower Body Dressing: Minimal assistance;Sit to/from stand;Sitting/lateral leans   Toilet Transfer: Min guard;Ambulation;Regular Toilet   Toileting- Water quality scientist and Hygiene: Sit to/from stand;Sitting/lateral lean;Total assistance (catheter)       Functional mobility during ADLs: Min guard       Vision Baseline Vision/History: 4 Cataracts Patient Visual Report: No change from baseline Vision Assessment?: Vision impaired- to be further tested in functional context Additional Comments: pt reporting R eye blindness     Perception     Praxis      Pertinent Vitals/Pain Pain Assessment Pain Assessment: Faces Pain Score: 9  Faces Pain Scale: Hurts whole lot Pain Location: back Pain Descriptors / Indicators: Discomfort, Constant Pain  Intervention(s): Limited activity within patient's tolerance, Monitored during session, Repositioned, Patient requesting pain meds-RN notified      Hand Dominance Right   Extremity/Trunk Assessment Upper Extremity Assessment Upper Extremity Assessment: Generalized weakness   Lower Extremity Assessment Lower Extremity Assessment: Defer to PT evaluation   Cervical / Trunk Assessment Cervical / Trunk Assessment: Normal   Communication Communication Communication: No difficulties   Cognition Arousal/Alertness: Awake/alert Behavior During Therapy: Anxious Overall Cognitive Status: Within Functional Limits for tasks assessed                                 General Comments: pt reported hx of claustraphobia     General Comments  VSS on 3L O2    Exercises     Shoulder Instructions      Home Living Family/patient expects to be discharged to:: Private residence Living Arrangements: Children Available Help at Discharge: Family;Available 24 hours/day Type of Home: House Home Access: Stairs to enter [employer] of Steps: 6 Entrance Stairs-Rails: Right;Left Home Layout: One level     Bathroom Shower/Tub: Teacher, early years/pre: Handicapped height Bathroom Accessibility: Yes   Home Equipment: Shower seat;Grab bars - toilet;Grab bars - tub/shower   Additional Comments: on 2L of supplemental O2 at all times at home; pt lives with daughter who has a hx of CVA with hemiparesis per pt. Pt stated that she does not have to assist her daughter with any mobility or ADLs/IADLs. Both drive      Prior Functioning/Environment Prior Level of Function : Independent/Modified Independent;Driving             Mobility Comments: no AD use ADLs Comments: ind with IADLs, drives        OT Problem List: Decreased strength;Decreased activity tolerance;Decreased range of motion;Impaired balance (sitting and/or standing);Decreased safety awareness;Impaired vision/perception      OT Treatment/Interventions: Self-care/ADL training;Therapeutic exercise;Energy conservation;DME and/or AE  instruction;Therapeutic activities;Patient/family education;Balance training;Visual/perceptual remediation/compensation;Cognitive remediation/compensation    OT Goals(Current goals can be found in the care plan section) Acute Rehab OT Goals Patient Stated Goal: to go to rehab OT Goal Formulation: With patient Time For Goal Achievement: 07/19/22 Potential to Achieve Goals: Good  OT Frequency: Min 2X/week    Co-evaluation              AM-PAC OT "6 Clicks" Daily Activity     Outcome Measure Help from another person eating meals?: None Help from another person taking care of personal grooming?: None Help from another person toileting, which includes using toliet, bedpan, or urinal?: Total Help from another person bathing (including washing, rinsing, drying)?: A Little Help from another person to put on and taking off regular upper body clothing?: A Little Help from another person to put on and taking off regular lower body clothing?: A Little 6 Click Score: 18   End of Session Equipment Utilized During Treatment: Gait belt;Oxygen Nurse Communication: Mobility status;Patient requests pain meds  Activity Tolerance: Patient tolerated treatment well Patient left: in chair;with call bell/phone within reach;with chair alarm set  OT Visit Diagnosis: Unsteadiness on feet (R26.81);Other abnormalities of gait and mobility (R26.89);Muscle weakness (generalized) (M62.81);Low vision, both eyes (H54.2)                Time: 3235-5732 OT Time Calculation (min): 24 min Charges:  OT General Charges $OT Visit: 1 Visit OT Evaluation $OT Eval Low Complexity: 1 Low OT Treatments $Self Care/Home Management : 8-22  mins  Lynnda Child, [place], OTR/L Acute Rehab [phone]  Kaylyn Lim 07/05/2022, 1:12 PM

## 2022-07-06 DIAGNOSIS — R6520 Severe sepsis without septic shock: Secondary | ICD-10-CM

## 2022-07-06 DIAGNOSIS — J9621 Acute and chronic respiratory failure with hypoxia: Secondary | ICD-10-CM | POA: Diagnosis not present

## 2022-07-06 DIAGNOSIS — R339 Retention of urine, unspecified: Secondary | ICD-10-CM

## 2022-07-06 DIAGNOSIS — E669 Obesity, unspecified: Secondary | ICD-10-CM

## 2022-07-06 DIAGNOSIS — B9620 Unspecified Escherichia coli [E. coli] as the cause of diseases classified elsewhere: Secondary | ICD-10-CM

## 2022-07-06 DIAGNOSIS — A419 Sepsis, unspecified organism: Secondary | ICD-10-CM | POA: Diagnosis not present

## 2022-07-06 DIAGNOSIS — N3001 Acute cystitis with hematuria: Secondary | ICD-10-CM

## 2022-07-06 DIAGNOSIS — F419 Anxiety disorder, unspecified: Secondary | ICD-10-CM

## 2022-07-06 DIAGNOSIS — G9341 Metabolic encephalopathy: Secondary | ICD-10-CM | POA: Diagnosis not present

## 2022-07-06 DIAGNOSIS — J441 Chronic obstructive pulmonary disease with (acute) exacerbation: Secondary | ICD-10-CM

## 2022-07-06 DIAGNOSIS — R911 Solitary pulmonary nodule: Secondary | ICD-10-CM

## 2022-07-06 DIAGNOSIS — I5033 Acute on chronic diastolic (congestive) heart failure: Secondary | ICD-10-CM | POA: Diagnosis not present

## 2022-07-06 DIAGNOSIS — G8929 Other chronic pain: Secondary | ICD-10-CM

## 2022-07-06 DIAGNOSIS — D649 Anemia, unspecified: Secondary | ICD-10-CM

## 2022-07-06 DIAGNOSIS — M545 Low back pain, unspecified: Secondary | ICD-10-CM

## 2022-07-06 DIAGNOSIS — R7881 Bacteremia: Secondary | ICD-10-CM

## 2022-07-06 LAB — CBC WITH DIFFERENTIAL/PLATELET
Abs Immature Granulocytes: 0.42 10*3/uL — ABNORMAL HIGH (ref 0.00–0.07)
Basophils Absolute: 0 10*3/uL (ref 0.0–0.1)
Basophils Relative: 0 %
Eosinophils Absolute: 0.5 10*3/uL (ref 0.0–0.5)
Eosinophils Relative: 3 %
HCT: 33.9 % — ABNORMAL LOW (ref 36.0–46.0)
Hemoglobin: 10.2 g/dL — ABNORMAL LOW (ref 12.0–15.0)
Immature Granulocytes: 3 %
Lymphocytes Relative: 17 %
Lymphs Abs: 2.4 10*3/uL (ref 0.7–4.0)
MCH: 28.3 pg (ref 26.0–34.0)
MCHC: 30.1 g/dL (ref 30.0–36.0)
MCV: 94.2 fL (ref 80.0–100.0)
Monocytes Absolute: 1.5 10*3/uL — ABNORMAL HIGH (ref 0.1–1.0)
Monocytes Relative: 10 %
Neutro Abs: 9.3 10*3/uL — ABNORMAL HIGH (ref 1.7–7.7)
Neutrophils Relative %: 67 %
Platelets: 207 10*3/uL (ref 150–400)
RBC: 3.6 MIL/uL — ABNORMAL LOW (ref 3.87–5.11)
RDW: 13.1 % (ref 11.5–15.5)
WBC: 14 10*3/uL — ABNORMAL HIGH (ref 4.0–10.5)
nRBC: 0 % (ref 0.0–0.2)

## 2022-07-06 LAB — PROCALCITONIN: Procalcitonin: 0.17 ng/mL

## 2022-07-06 LAB — BASIC METABOLIC PANEL
Anion gap: 10 (ref 5–15)
BUN: 15 mg/dL (ref 8–23)
CO2: 41 mmol/L — ABNORMAL HIGH (ref 22–32)
Calcium: 8.2 mg/dL — ABNORMAL LOW (ref 8.9–10.3)
Chloride: 87 mmol/L — ABNORMAL LOW (ref 98–111)
Creatinine, Ser: 0.79 mg/dL (ref 0.44–1.00)
GFR, Estimated: >60 mL/min (ref >60)
Glucose, Bld: 101 mg/dL — ABNORMAL HIGH (ref 70–99)
Potassium: 4.1 mmol/L (ref 3.5–5.1)
Sodium: 138 mmol/L (ref 135–145)

## 2022-07-06 LAB — BRAIN NATRIURETIC PEPTIDE: B Natriuretic Peptide: 381.6 pg/mL — ABNORMAL HIGH (ref 0.0–100.0)

## 2022-07-06 LAB — MAGNESIUM: Magnesium: 1.7 mg/dL (ref 1.7–2.4)

## 2022-07-06 MED ORDER — LEVALBUTEROL HCL 0.63 MG/3ML IN NEBU: 0.6300 mg | INHALATION_SOLUTION | Freq: Four times a day (QID) | RESPIRATORY_TRACT | Status: DC | PRN

## 2022-07-06 MED ORDER — IPRATROPIUM BROMIDE 0.02 % IN SOLN: 0.5000 mg | Freq: Four times a day (QID) | RESPIRATORY_TRACT | Status: DC | PRN

## 2022-07-06 MED ORDER — FUROSEMIDE 20 MG PO TABS
20.0000 mg | ORAL_TABLET | Freq: Every day | ORAL | Status: DC
  Administered 2022-07-06 – 2022-07-07 (×2): 20 mg via ORAL
  Filled 2022-07-06 (×2): qty 1

## 2022-07-06 MED ORDER — DILTIAZEM HCL ER COATED BEADS 180 MG PO CP24
180.0000 mg | ORAL_CAPSULE | Freq: Every day | ORAL | Status: DC
  Administered 2022-07-06 – 2022-07-07 (×2): 180 mg via ORAL
  Filled 2022-07-06 (×2): qty 1

## 2022-07-06 MED ORDER — BETHANECHOL CHLORIDE 10 MG PO TABS
10.0000 mg | ORAL_TABLET | Freq: Three times a day (TID) | ORAL | Status: DC
  Administered 2022-07-06 – 2022-07-07 (×3): 10 mg via ORAL
  Filled 2022-07-06 (×5): qty 1

## 2022-07-06 MED ORDER — BETHANECHOL CHLORIDE 25 MG PO TABS
25.0000 mg | ORAL_TABLET | Freq: Once | ORAL | Status: AC
  Administered 2022-07-06: 25 mg via ORAL
  Filled 2022-07-06: qty 1

## 2022-07-06 MED ORDER — DILTIAZEM HCL 60 MG PO TABS: 30.0000 mg | ORAL_TABLET | Freq: Four times a day (QID) | ORAL | Status: DC | PRN

## 2022-07-06 NOTE — Progress Notes (Addendum)
PROGRESS NOTE  [name]:[id number] DOB: Aug 27, 1951   PCP: Celene Squibb, MD  Patient is from: Home  DOA: 06/29/2022 LOS: 6  Chief complaints Chief Complaint  Patient presents with   Shortness of Breath   Chest Pain   Altered Mental Status     Brief Narrative / Interim history: 71 year old F with PMH of COPD/chronic RF on 2 L, diastolic CHF, HTN, chronic back pain, urinary retention and anxiety presenting with progressive confusion and fever, and admitted for severe sepsis and acute metabolic encephalopathy due to acute pyelonephritis and E. coli bacteremia.  Patient was febrile to 101.7 with leukocytosis to 22 and hypotension to 90/41.  She was lethargic and had an AKI with creatinine of 1.3.  She also had elevated troponin to 902 that has trended up to 1114.  EKG without significant acute ischemic finding.  CT head negative.  CT chest abdomen and pelvis with contrast showed 7 mm LLL nodule, left pyelonephritis and minimal fullness of proximal ureter without obstruction.  Cultures obtained.  Foley catheter placed.  Patient was started on IV fluid and broad-spectrum antibiotics and admitted.  Cardiology consulted.   TTE with LVEF of 55 to 60%, G1 DD and moderate TVR.  Nuclear stress test with normal LV perfusion and no evidence of ischemia but poor quality due to patient's movement.   CTA chest PE protocol on 7/8 with increased clustered ill-defined GG nodularity in the superior segment of LLL and scattered tiny centrilobular nodules in both lungs consistent with infectious or inflammatory bronchiolitis and concern for aspiration pneumonia given the small amount of secretion in trachea.  MRI lumbar spine without acute finding or osteomyelitis but multilevel spondylosis and moderate bilateral neuroforaminal stenosis at L5-S1.  Patient remains on IV antibiotics for E. coli bacteremia and acute pyelonephritis  Therapy recommended SNF.  Subjective: Seen and examined earlier this  morning.  No major events overnight of this morning.  No complaints other than chronic back pain.  Denies chest pain, dyspnea, GI or UTI symptoms.  Still with indwelling Foley catheter.  Objective: Vitals:   07/06/22 0500 07/06/22 0722 07/06/22 0826 07/06/22 1212  BP: 132/61 132/71    Pulse: 92 (!) 101    Resp: (!) 21 17    Temp: 98.2 F (36.8 C) 98.1 F (36.7 C)    TempSrc: Oral Oral    SpO2: 95% 91% 93% 92%  Weight: 82 kg     Height:        Examination:  GENERAL: No apparent distress.  Nontoxic. HEENT: MMM.  Vision and hearing grossly intact.  NECK: Supple.  No apparent JVD.  RESP: 92% on 2 L.  No IWOB.  Fair aeration bilaterally. CVS:  RRR. Heart sounds normal.  ABD/GI/GU: BS+. Abd soft, NTND.  No CVA tenderness.  Indwelling Foley. MSK/EXT:  Moves extremities. No apparent deformity. No edema.  SKIN: no apparent skin lesion or wound NEURO: Awake, alert and oriented appropriately.  No apparent focal neuro deficit. PSYCH: Somewhat anxious.  Procedures:  None  Microbiology summarized: 7/5-COVID-19 and influenza PCR nonreactive. 7/5-blood culture with pansensitive E. Coli  Assessment and plan: Principal Problem:   Severe sepsis ([place]) Active Problems:   Urinary retention   COPD exacerbation (HCC)   Anxiety   Acute on chronic diastolic CHF (congestive heart failure) (HCC)   Troponin level elevated   Acute pyelonephritis   E coli bacteremia   Acute metabolic encephalopathy   Acute on chronic respiratory failure with hypoxia and hypercapnia ([place])  Urinary tract infection   Chronic lower back pain   Pulmonary nodule   Obesity (BMI 30-39.9)   Normocytic anemia  Severe sepsis due to acute left pyelonephritis and E. coli bacteremia in the setting of urinary retention, and possible aspiration pneumonia: POA.  Patient had fever, leukocytosis, hypotension, AKI and encephalopathy on presentation.  Blood culture with pansensitive E. coli.  Imaging concerning for left  pyelonephritis and left ureteral fullness without obstructing stone.  Imaging also raises concern about left lung pneumonia and possible aspiration. -Antibiotics de-escalated to IV Ancef-complete 10 days course  Acute metabolic encephalopathy: Likely due to the above.  She is also at risk for polypharmacy due to pain medications other sedating medications.  Resolved. -Reorientation and delirium precautions -Minimize sedating medications  Possible aspiration pneumonia: CTA chest negative for PE but raises concern for LLL pneumonia with concern for aspiration pneumonia given fluid in trachea. -Aspiration precaution -Antibiotics as above -No need for anaerobic coverage given clinical improvement  Acute on chronic diastolic CHF: Evidence of fluid overload per exam by previous attending.  Likely in the setting of fluid resuscitation for sepsis.  TTE with normal LVEF, G1 DD and RVSP of 44.  BNP elevated.  Diuresed with IV Lasix.  Net -8.5 L.  Appears euvolemic on exam today.  BNP improved -P.o. Lasix 20 mg daily -Monitor intake output, daily weight, renal functions and electrolytes  COPD exacerbation: Seems to have resolved. -Continue LABA/LAMA/ICS -Change albuterol to Xopenex with Atrovent -Wean oxygen as able  Acute on chronic respiratory failure with hypoxia: Reports using 2 L at home. -Wean oxygen as able -Treat treatable causes  Acute kidney injury: Resolved. Recent Labs    06/29/22 2057 06/30/22 0451 07/01/22 0207 07/02/22 0122 07/03/22 0221 07/04/22 0049 07/05/22 0231 07/06/22 0301  BUN 26* 25* 30* '23 20 21 19 15  '$ CREATININE 1.29* 1.12* 0.94 0.83 0.67 0.91 0.86 0.79  -Avoid nephrotoxic meds  Atypical chest pain/elevated troponin: Troponin elevated to 900 and peaked at 1114.  Demand ischemia?  TTE as above.  Nuclear stress test negative but poor quality.  Chest pain could be due to pneumonia as well.  Symptoms resolved.  Cardiology signed off.  At this point, we can say  non-STEMI ruled out. -Continue aspirin and statin.  Essential hypertension: Normotensive. -Discontinue losartan -Consolidated Cardizem to Cardizem CD per cardiology  Urinary retention: Chronic urinary retention? -Start bethanechol -Voiding trial  Chronic low back pain-lumbar MRI without acute finding but spondylolysis with moderate bilateral neuroforaminal stenosis at L5-S1. -PT/OT-recommended SNF. -Continue home pain regimen  Normocytic anemia: Stable. Recent Labs    06/29/22 2057 06/30/22 0251 06/30/22 0451 07/01/22 0207 07/02/22 0122 07/03/22 0221 07/04/22 0049 07/05/22 0231 07/06/22 0301  HGB 10.3* 10.2* 9.5* 9.4* 8.8* 9.0* 9.3* 10.0* 10.2*  -Monitor  8 mm ground-glass nodule in the left lower lobe.  PCP to arrange outpatient pulmonary follow-up within 1 to 2 weeks of discharge for follow-up and monitoring.   Obesity Body mass index is 32.02 kg/m.   DVT prophylaxis:  enoxaparin (LOVENOX) injection 40 mg Start: 07/02/22 1000  Code Status: Full code Family Communication: None at bedside Level of care: Progressive Status is: Inpatient Remains inpatient appropriate because: Safe disposition/SNF   Final disposition: SNF Consultants:  Cardiology  Sch Meds:  Scheduled Meds:  atorvastatin  80 mg Oral Daily   bethanechol  10 mg Oral TID   Chlorhexidine Gluconate Cloth  6 each Topical Daily   diltiazem  180 mg Oral Daily   doxycycline  100  mg Oral Q12H   enoxaparin (LOVENOX) injection  40 mg Subcutaneous Q24H   fluticasone furoate-vilanterol  1 puff Inhalation Daily   And   umeclidinium bromide  1 puff Inhalation Daily   furosemide  20 mg Oral Daily   lidocaine  1 patch Transdermal Q24H   LORazepam  0.5 mg Intravenous Once   pantoprazole  20 mg Oral Daily   polyethylene glycol  17 g Oral Daily   senna-docusate  1 tablet Oral QHS   tamsulosin  0.4 mg Oral QPC supper   cyanocobalamin  1,000 mcg Oral Daily   Continuous Infusions:   ceFAZolin (ANCEF) IV 2  g (07/06/22 1301)   PRN Meds:.clonazePAM, diltiazem, hydrALAZINE, HYDROcodone-acetaminophen, levalbuterol **AND** ipratropium, melatonin, ondansetron (ZOFRAN) IV  Antimicrobials: Anti-infectives (From admission, onward)    Start     Dose/Rate Route Frequency Ordered Stop   07/05/22 1145  doxycycline (VIBRA-TABS) tablet 100 mg        100 mg Oral Every 12 hours 07/05/22 1059 07/09/22 0959   07/04/22 1445  ceFAZolin (ANCEF) IVPB 2g/100 mL premix        2 g 200 mL/hr over 30 Minutes Intravenous Every 8 hours 07/04/22 1350 07/09/22 2359   06/30/22 1245  cefTRIAXone (ROCEPHIN) 2 g in sodium chloride 0.9 % 100 mL IVPB  Status:  Discontinued        2 g 200 mL/hr over 30 Minutes Intravenous Every 24 hours 06/30/22 1244 07/04/22 1350   06/29/22 2145  ceFEPIme (MAXIPIME) 2 g in sodium chloride 0.9 % 100 mL IVPB        2 g 200 mL/hr over 30 Minutes Intravenous  Once 06/29/22 2140 06/29/22 2301   06/29/22 2145  vancomycin (VANCOCIN) IVPB 1000 mg/200 mL premix        1,000 mg 200 mL/hr over 60 Minutes Intravenous  Once 06/29/22 2140 06/29/22 2349        I have personally reviewed the following labs and images: CBC: Recent Labs  Lab 07/02/22 0122 07/03/22 0221 07/04/22 0049 07/05/22 0231 07/06/22 0301  WBC 12.6* 10.6* 10.7* 13.6* 14.0*  NEUTROABS 10.8* 7.7 7.4 9.4* 9.3*  HGB 8.8* 9.0* 9.3* 10.0* 10.2*  HCT 30.0* 30.5* 31.6* 32.7* 33.9*  MCV 97.7 98.4 96.6 95.3 94.2  PLT 180 177 191 184 207   BMP &GFR Recent Labs  Lab 06/30/22 0451 07/01/22 0207 07/02/22 0122 07/03/22 0221 07/04/22 0049 07/05/22 0231 07/06/22 0301  NA 133* 137 140 141 141 141 138  K 3.6 3.7 4.1 4.8 4.2 4.2 4.1  CL 91* 94* 96* 92* 90* 87* 87*  CO2 32 33* 35* 41* 44* 42* 41*  GLUCOSE 212* 155* 129* 96 95 93 101*  BUN 25* 30* '23 20 21 19 15  '$ CREATININE 1.12* 0.94 0.83 0.67 0.91 0.86 0.79  CALCIUM 7.8* 8.4* 8.5* 8.6* 8.4* 8.3* 8.2*  MG 2.3 2.4  --   --  1.9 1.8 1.7  PHOS 2.8  --   --   --   --   --   --     Estimated Creatinine Clearance: 68.2 mL/min (by C-G formula based on SCr of 0.79 mg/dL). Liver & Pancreas: Recent Labs  Lab 06/29/22 2057 06/30/22 0451  AST 24 21  ALT 16 15  ALKPHOS 65 54  BILITOT 0.4 0.4  PROT 6.4* 5.8*  ALBUMIN 3.2* 2.6*   No results for input(s): "LIPASE", "AMYLASE" in the last 168 hours. No results for input(s): "AMMONIA" in the last 168 hours. Diabetic: No results  for input(s): "HGBA1C" in the last 72 hours. No results for input(s): "GLUCAP" in the last 168 hours. Cardiac Enzymes: No results for input(s): "CKTOTAL", "CKMB", "CKMBINDEX", "TROPONINI" in the last 168 hours. No results for input(s): "PROBNP" in the last 8760 hours. Coagulation Profile: Recent Labs  Lab 06/29/22 2057  INR 1.2   Thyroid Function Tests: Recent Labs    07/04/22 0049  TSH 1.060   Lipid Profile: No results for input(s): "CHOL", "HDL", "LDLCALC", "TRIG", "CHOLHDL", "LDLDIRECT" in the last 72 hours. Anemia Panel: No results for input(s): "VITAMINB12", "FOLATE", "FERRITIN", "TIBC", "IRON", "RETICCTPCT" in the last 72 hours. Urine analysis:    Component Value Date/Time   COLORURINE YELLOW 06/30/2022 0813   APPEARANCEUR HAZY (A) 06/30/2022 0813   APPEARANCEUR Clear 03/02/2021 1629   LABSPEC 1.023 06/30/2022 0813   PHURINE 6.0 06/30/2022 0813   GLUCOSEU NEGATIVE 06/30/2022 0813   HGBUR MODERATE (A) 06/30/2022 0813   BILIRUBINUR NEGATIVE 06/30/2022 0813   BILIRUBINUR Negative 03/02/2021 1629   KETONESUR NEGATIVE 06/30/2022 0813   PROTEINUR NEGATIVE 06/30/2022 0813   UROBILINOGEN 0.2 08/17/2011 1302   NITRITE NEGATIVE 06/30/2022 0813   LEUKOCYTESUR MODERATE (A) 06/30/2022 0813   Sepsis Labs: Invalid input(s): "PROCALCITONIN", "LACTICIDVEN"  Microbiology: Recent Results (from the past 240 hour(s))  Resp Panel by RT-PCR (Flu A&B, Covid) Anterior Nasal Swab     Status: None   Collection Time: 06/29/22  8:54 PM   Specimen: Anterior Nasal Swab  Result Value Ref Range  Status   SARS Coronavirus 2 by RT PCR NEGATIVE NEGATIVE Final    Comment: (NOTE) SARS-CoV-2 target nucleic acids are NOT DETECTED.  The SARS-CoV-2 RNA is generally detectable in upper respiratory specimens during the acute phase of infection. The lowest concentration of SARS-CoV-2 viral copies this assay can detect is 138 copies/mL. A negative result does not preclude SARS-Cov-2 infection and should not be used as the sole basis for treatment or other patient management decisions. A negative result may occur with  improper specimen collection/handling, submission of specimen other than nasopharyngeal swab, presence of viral mutation(s) within the areas targeted by this assay, and inadequate number of viral copies(<138 copies/mL). A negative result must be combined with clinical observations, patient history, and epidemiological information. The expected result is Negative.  Fact Sheet for Patients:  EntrepreneurPulse.com.au  Fact Sheet for Healthcare Providers:  IncredibleEmployment.be  This test is no t yet approved or cleared by the [place] FDA and  has been authorized for detection and/or diagnosis of SARS-CoV-2 by FDA under an Emergency Use Authorization (EUA). This EUA will remain  in effect (meaning this test can be used) for the duration of the COVID-19 declaration under Section 564(b)(1) of the Act, 21 U.S.C.section 360bbb-3(b)(1), unless the authorization is terminated  or revoked sooner.       Influenza A by PCR NEGATIVE NEGATIVE Final   Influenza B by PCR NEGATIVE NEGATIVE Final    Comment: (NOTE) The Xpert Xpress SARS-CoV-2/FLU/RSV plus assay is intended as an aid in the diagnosis of influenza from Nasopharyngeal swab specimens and should not be used as a sole basis for treatment. Nasal washings and aspirates are unacceptable for Xpert Xpress SARS-CoV-2/FLU/RSV testing.  Fact Sheet for  Patients: EntrepreneurPulse.com.au  Fact Sheet for Healthcare Providers: IncredibleEmployment.be  This test is not yet approved or cleared by the [place] FDA and has been authorized for detection and/or diagnosis of SARS-CoV-2 by FDA under an Emergency Use Authorization (EUA). This EUA will remain in effect (meaning this test can be  used) for the duration of the COVID-19 declaration under Section 564(b)(1) of the Act, 21 U.S.C. section 360bbb-3(b)(1), unless the authorization is terminated or revoked.  Performed at [facility], [place] [address] [place], [place] [address]   Blood Culture (routine x 2)     Status: Abnormal   Collection Time: 06/29/22  8:54 PM   Specimen: BLOOD  Result Value Ref Range Status   Specimen Description BLOOD SITE NOT SPECIFIED  Final   Special Requests   Final    BOTTLES DRAWN AEROBIC AND ANAEROBIC Blood Culture adequate volume   Culture  Setup Time   Final    GRAM NEGATIVE RODS IN BOTH AEROBIC AND ANAEROBIC BOTTLES CRITICAL RESULT CALLED TO, READ BACK BY AND VERIFIED WITH: [facility] [id number] AT 6283 BY EC Performed at [facility], [place] [address], [place],  [address]    Culture ESCHERICHIA COLI (A)  Final   Report Status 07/02/2022 FINAL  Final   Organism ID, Bacteria ESCHERICHIA COLI  Final      Susceptibility   Escherichia coli - MIC*    AMPICILLIN <=2 SENSITIVE Sensitive     CEFAZOLIN <=4 SENSITIVE Sensitive     CEFEPIME <=0.12 SENSITIVE Sensitive     CEFTAZIDIME <=1 SENSITIVE Sensitive     CEFTRIAXONE <=0.25 SENSITIVE Sensitive     CIPROFLOXACIN <=0.25 SENSITIVE Sensitive     GENTAMICIN <=1 SENSITIVE Sensitive     IMIPENEM <=0.25 SENSITIVE Sensitive     TRIMETH/SULFA <=20 SENSITIVE Sensitive     AMPICILLIN/SULBACTAM <=2 SENSITIVE Sensitive     PIP/TAZO <=4 SENSITIVE Sensitive     * ESCHERICHIA COLI  Blood Culture ID Panel (Reflexed)     Status: Abnormal   Collection  Time: 06/29/22  8:54 PM  Result Value Ref Range Status   Enterococcus faecalis NOT DETECTED NOT DETECTED Final   Enterococcus Faecium NOT DETECTED NOT DETECTED Final   Listeria monocytogenes NOT DETECTED NOT DETECTED Final   Staphylococcus species NOT DETECTED NOT DETECTED Final   Staphylococcus aureus (BCID) NOT DETECTED NOT DETECTED Final   Staphylococcus epidermidis NOT DETECTED NOT DETECTED Final   Staphylococcus lugdunensis NOT DETECTED NOT DETECTED Final   Streptococcus species NOT DETECTED NOT DETECTED Final   Streptococcus agalactiae NOT DETECTED NOT DETECTED Final   Streptococcus pneumoniae NOT DETECTED NOT DETECTED Final   Streptococcus pyogenes NOT DETECTED NOT DETECTED Final   A.calcoaceticus-baumannii NOT DETECTED NOT DETECTED Final   Bacteroides fragilis NOT DETECTED NOT DETECTED Final   Enterobacterales DETECTED (A) NOT DETECTED Final    Comment: Enterobacterales represent a large order of gram negative bacteria, not a single organism. CRITICAL RESULT CALLED TO, READ BACK BY AND VERIFIED WITH: PHARMD EMILY SINCLAIR [id number] AT 1062 BY EC    Enterobacter cloacae complex NOT DETECTED NOT DETECTED Final   Escherichia coli DETECTED (A) NOT DETECTED Final    Comment: CRITICAL RESULT CALLED TO, READ BACK BY AND VERIFIED WITH: PHARMD EMILY SINCLAIR [id number] AT 1242 BY EC    Klebsiella aerogenes NOT DETECTED NOT DETECTED Final   Klebsiella oxytoca NOT DETECTED NOT DETECTED Final   Klebsiella pneumoniae NOT DETECTED NOT DETECTED Final   Proteus species NOT DETECTED NOT DETECTED Final   Salmonella species NOT DETECTED NOT DETECTED Final   Serratia marcescens NOT DETECTED NOT DETECTED Final   Haemophilus influenzae NOT DETECTED NOT DETECTED Final   Neisseria meningitidis NOT DETECTED NOT DETECTED Final   Pseudomonas aeruginosa NOT DETECTED NOT DETECTED Final   Stenotrophomonas maltophilia NOT DETECTED  NOT DETECTED Final   Candida albicans NOT DETECTED NOT DETECTED Final   Candida  auris NOT DETECTED NOT DETECTED Final   Candida glabrata NOT DETECTED NOT DETECTED Final   Candida krusei NOT DETECTED NOT DETECTED Final   Candida parapsilosis NOT DETECTED NOT DETECTED Final   Candida tropicalis NOT DETECTED NOT DETECTED Final   Cryptococcus neoformans/gattii NOT DETECTED NOT DETECTED Final   CTX-M ESBL NOT DETECTED NOT DETECTED Final   Carbapenem resistance IMP NOT DETECTED NOT DETECTED Final   Carbapenem resistance KPC NOT DETECTED NOT DETECTED Final   Carbapenem resistance NDM NOT DETECTED NOT DETECTED Final   Carbapenem resist OXA 48 LIKE NOT DETECTED NOT DETECTED Final   Carbapenem resistance VIM NOT DETECTED NOT DETECTED Final    Comment: Performed at [facility], [address], [place], [place] [address]  Blood Culture (routine x 2)     Status: Abnormal   Collection Time: 06/29/22  9:03 PM   Specimen: BLOOD  Result Value Ref Range Status   Specimen Description BLOOD RIGHT ANTECUBITAL  Final   Special Requests   Final    BOTTLES DRAWN AEROBIC AND ANAEROBIC Blood Culture adequate volume   Culture  Setup Time   Final    GRAM NEGATIVE RODS ANAEROBIC BOTTLE ONLY CRITICAL VALUE NOTED.  VALUE IS CONSISTENT WITH PREVIOUSLY REPORTED AND CALLED VALUE.    Culture (A)  Final    ESCHERICHIA COLI SUSCEPTIBILITIES PERFORMED ON PREVIOUS CULTURE WITHIN THE LAST 5 DAYS. Performed at [facility], [place] [address], [place], [place] [address]    Report Status 07/02/2022 FINAL  Final  Urine Culture     Status: None   Collection Time: 06/30/22  8:00 AM   Specimen: In/Out Cath Urine  Result Value Ref Range Status   Specimen Description IN/OUT CATH URINE  Final   Special Requests NONE  Final   Culture   Final    NO GROWTH Performed at [facility], [place] [address], [place], [place] [address]    Report Status 07/01/2022 FINAL  Final    Radiology Studies: No results found.    Cathyann Kilfoyle T. Avilla  If 7PM-7AM, please contact  night-coverage [email or web address] 07/06/2022, 4:54 PM

## 2022-07-06 NOTE — TOC Progression Note (Signed)
Transition of Care [facility]) - Progression Note    Patient Details  Name: [name] MRN: [id number] Date of Birth: 03-24-1951  Transition of Care [facility]) CM/SW [place], LCSW Phone Number: 07/06/2022, 5:56 PM  Clinical Narrative:    CSW met with patient and her daughter at bedside. Patient has selected [facility]. [facility] able to accept patient. CSW updated Navi on facility choice.    Expected Discharge Plan: Skilled Nursing Facility Barriers to Discharge: Continued Medical Work up  Expected Discharge Plan and Services Expected Discharge Plan: [place]                                               Social Determinants of Health (SDOH) Interventions    Readmission Risk Interventions     No data to display

## 2022-07-06 NOTE — Progress Notes (Signed)
Pt placed on BIPAP for night rest tolerating well.  Set on auto titrate with 3 lpm oxygen bled in.  RT will continue to monitor.

## 2022-07-07 DIAGNOSIS — K5900 Constipation, unspecified: Secondary | ICD-10-CM | POA: Diagnosis not present

## 2022-07-07 DIAGNOSIS — R293 Abnormal posture: Secondary | ICD-10-CM | POA: Diagnosis not present

## 2022-07-07 DIAGNOSIS — K219 Gastro-esophageal reflux disease without esophagitis: Secondary | ICD-10-CM | POA: Diagnosis not present

## 2022-07-07 DIAGNOSIS — J9621 Acute and chronic respiratory failure with hypoxia: Secondary | ICD-10-CM | POA: Diagnosis not present

## 2022-07-07 DIAGNOSIS — Z7401 Bed confinement status: Secondary | ICD-10-CM | POA: Diagnosis not present

## 2022-07-07 DIAGNOSIS — B3731 Acute candidiasis of vulva and vagina: Secondary | ICD-10-CM | POA: Diagnosis not present

## 2022-07-07 DIAGNOSIS — R531 Weakness: Secondary | ICD-10-CM | POA: Diagnosis not present

## 2022-07-07 DIAGNOSIS — I10 Essential (primary) hypertension: Secondary | ICD-10-CM | POA: Diagnosis not present

## 2022-07-07 DIAGNOSIS — N10 Acute tubulo-interstitial nephritis: Secondary | ICD-10-CM | POA: Diagnosis not present

## 2022-07-07 DIAGNOSIS — I5030 Unspecified diastolic (congestive) heart failure: Secondary | ICD-10-CM | POA: Diagnosis not present

## 2022-07-07 DIAGNOSIS — E559 Vitamin D deficiency, unspecified: Secondary | ICD-10-CM | POA: Diagnosis not present

## 2022-07-07 DIAGNOSIS — R262 Difficulty in walking, not elsewhere classified: Secondary | ICD-10-CM | POA: Diagnosis not present

## 2022-07-07 DIAGNOSIS — N390 Urinary tract infection, site not specified: Secondary | ICD-10-CM | POA: Diagnosis not present

## 2022-07-07 DIAGNOSIS — J9690 Respiratory failure, unspecified, unspecified whether with hypoxia or hypercapnia: Secondary | ICD-10-CM | POA: Diagnosis not present

## 2022-07-07 DIAGNOSIS — N3001 Acute cystitis with hematuria: Secondary | ICD-10-CM | POA: Diagnosis not present

## 2022-07-07 DIAGNOSIS — R339 Retention of urine, unspecified: Secondary | ICD-10-CM | POA: Diagnosis not present

## 2022-07-07 DIAGNOSIS — I5033 Acute on chronic diastolic (congestive) heart failure: Secondary | ICD-10-CM | POA: Diagnosis not present

## 2022-07-07 DIAGNOSIS — N12 Tubulo-interstitial nephritis, not specified as acute or chronic: Secondary | ICD-10-CM | POA: Diagnosis not present

## 2022-07-07 DIAGNOSIS — R2681 Unsteadiness on feet: Secondary | ICD-10-CM | POA: Diagnosis not present

## 2022-07-07 DIAGNOSIS — N139 Obstructive and reflux uropathy, unspecified: Secondary | ICD-10-CM | POA: Diagnosis not present

## 2022-07-07 DIAGNOSIS — R778 Other specified abnormalities of plasma proteins: Secondary | ICD-10-CM | POA: Diagnosis not present

## 2022-07-07 DIAGNOSIS — D649 Anemia, unspecified: Secondary | ICD-10-CM | POA: Diagnosis not present

## 2022-07-07 DIAGNOSIS — G9341 Metabolic encephalopathy: Secondary | ICD-10-CM | POA: Diagnosis not present

## 2022-07-07 DIAGNOSIS — R7309 Other abnormal glucose: Secondary | ICD-10-CM | POA: Diagnosis not present

## 2022-07-07 DIAGNOSIS — Z743 Need for continuous supervision: Secondary | ICD-10-CM | POA: Diagnosis not present

## 2022-07-07 DIAGNOSIS — M6281 Muscle weakness (generalized): Secondary | ICD-10-CM | POA: Diagnosis not present

## 2022-07-07 DIAGNOSIS — J449 Chronic obstructive pulmonary disease, unspecified: Secondary | ICD-10-CM | POA: Diagnosis not present

## 2022-07-07 DIAGNOSIS — G9340 Encephalopathy, unspecified: Secondary | ICD-10-CM | POA: Diagnosis not present

## 2022-07-07 DIAGNOSIS — E785 Hyperlipidemia, unspecified: Secondary | ICD-10-CM | POA: Diagnosis not present

## 2022-07-07 DIAGNOSIS — R278 Other lack of coordination: Secondary | ICD-10-CM | POA: Diagnosis not present

## 2022-07-07 DIAGNOSIS — M545 Low back pain, unspecified: Secondary | ICD-10-CM | POA: Diagnosis not present

## 2022-07-07 DIAGNOSIS — R911 Solitary pulmonary nodule: Secondary | ICD-10-CM | POA: Diagnosis not present

## 2022-07-07 DIAGNOSIS — A419 Sepsis, unspecified organism: Secondary | ICD-10-CM | POA: Diagnosis not present

## 2022-07-07 LAB — CBC
HCT: 31.1 % — ABNORMAL LOW (ref 36.0–46.0)
Hemoglobin: 9.7 g/dL — ABNORMAL LOW (ref 12.0–15.0)
MCH: 29.4 pg (ref 26.0–34.0)
MCHC: 31.2 g/dL (ref 30.0–36.0)
MCV: 94.2 fL (ref 80.0–100.0)
Platelets: 214 10*3/uL (ref 150–400)
RBC: 3.3 MIL/uL — ABNORMAL LOW (ref 3.87–5.11)
RDW: 13.1 % (ref 11.5–15.5)
WBC: 13.1 10*3/uL — ABNORMAL HIGH (ref 4.0–10.5)
nRBC: 0 % (ref 0.0–0.2)

## 2022-07-07 LAB — BLOOD GAS, VENOUS
Acid-Base Excess: 26 mmol/L — ABNORMAL HIGH (ref 0.0–2.0)
Bicarbonate: 56.4 mmol/L — ABNORMAL HIGH (ref 20.0–28.0)
Drawn by: 65756
O2 Saturation: 47.4 %
Patient temperature: 36.6
pH, Ven: 7.43 (ref 7.25–7.43)
pO2, Ven: 34 mmHg (ref 32–45)

## 2022-07-07 LAB — BRAIN NATRIURETIC PEPTIDE: B Natriuretic Peptide: 291.1 pg/mL — ABNORMAL HIGH (ref 0.0–100.0)

## 2022-07-07 LAB — RENAL FUNCTION PANEL
Albumin: 2.7 g/dL — ABNORMAL LOW (ref 3.5–5.0)
BUN: 12 mg/dL (ref 8–23)
CO2: >45 mmol/L — ABNORMAL HIGH (ref 22–32)
Calcium: 8.3 mg/dL — ABNORMAL LOW (ref 8.9–10.3)
Chloride: 87 mmol/L — ABNORMAL LOW (ref 98–111)
Creatinine, Ser: 0.64 mg/dL (ref 0.44–1.00)
GFR, Estimated: >60 mL/min (ref >60)
Glucose, Bld: 93 mg/dL (ref 70–99)
Phosphorus: 4.1 mg/dL (ref 2.5–4.6)
Potassium: 4.1 mmol/L (ref 3.5–5.1)
Sodium: 138 mmol/L (ref 135–145)

## 2022-07-07 LAB — FOLATE: Folate: 17.4 ng/mL (ref >5.9)

## 2022-07-07 LAB — FERRITIN: Ferritin: 59 ng/mL (ref 11–307)

## 2022-07-07 LAB — RETICULOCYTES
Immature Retic Fract: 28.9 % — ABNORMAL HIGH (ref 2.3–15.9)
RBC.: 3.32 MIL/uL — ABNORMAL LOW (ref 3.87–5.11)
Retic Count, Absolute: 53.8 10*3/uL (ref 19.0–186.0)
Retic Ct Pct: 1.6 % (ref 0.4–3.1)

## 2022-07-07 LAB — IRON AND TIBC
Iron: 36 ug/dL (ref 28–170)
Saturation Ratios: 13 % (ref 10.4–31.8)
TIBC: 277 ug/dL (ref 250–450)
UIBC: 241 ug/dL

## 2022-07-07 LAB — VITAMIN B12: Vitamin B-12: 1078 pg/mL — ABNORMAL HIGH (ref 180–914)

## 2022-07-07 LAB — MAGNESIUM: Magnesium: 1.6 mg/dL — ABNORMAL LOW (ref 1.7–2.4)

## 2022-07-07 MED ORDER — CEFADROXIL 1 G PO TABS
1.0000 g | ORAL_TABLET | Freq: Two times a day (BID) | ORAL | 0 refills | Status: AC
Start: 2022-07-07 — End: 2022-07-13

## 2022-07-07 MED ORDER — MELATONIN 5 MG PO TABS: 5.0000 mg | ORAL_TABLET | Freq: Once | ORAL | 0 refills | Status: DC | PRN

## 2022-07-07 MED ORDER — MAGNESIUM SULFATE 2 GM/50ML IV SOLN
2.0000 g | Freq: Once | INTRAVENOUS | Status: AC
  Administered 2022-07-07: 2 g via INTRAVENOUS
  Filled 2022-07-07: qty 50

## 2022-07-07 MED ORDER — SENNOSIDES-DOCUSATE SODIUM 8.6-50 MG PO TABS: 1.0000 | ORAL_TABLET | Freq: Every day | ORAL | Status: DC

## 2022-07-07 MED ORDER — ATORVASTATIN CALCIUM 80 MG PO TABS: 80.0000 mg | ORAL_TABLET | Freq: Every day | ORAL | Status: DC

## 2022-07-07 MED ORDER — DILTIAZEM HCL ER COATED BEADS 180 MG PO CP24: 180.0000 mg | ORAL_CAPSULE | Freq: Every day | ORAL | Status: DC

## 2022-07-07 MED ORDER — DOXYCYCLINE HYCLATE 100 MG PO TABS: 100.0000 mg | ORAL_TABLET | Freq: Two times a day (BID) | ORAL | 0 refills | Status: AC

## 2022-07-07 MED ORDER — PANTOPRAZOLE SODIUM 20 MG PO TBEC: 20.0000 mg | DELAYED_RELEASE_TABLET | Freq: Every day | ORAL | Status: DC

## 2022-07-07 MED ORDER — FUROSEMIDE 20 MG PO TABS: 20.0000 mg | ORAL_TABLET | Freq: Every day | ORAL | Status: DC

## 2022-07-07 MED ORDER — BETHANECHOL CHLORIDE 10 MG PO TABS: 10.0000 mg | ORAL_TABLET | Freq: Three times a day (TID) | ORAL | Status: DC

## 2022-07-07 MED ORDER — TAMSULOSIN HCL 0.4 MG PO CAPS: 0.4000 mg | ORAL_CAPSULE | Freq: Every day | ORAL | Status: DC

## 2022-07-07 MED ORDER — HYDROCODONE-ACETAMINOPHEN 10-325 MG PO TABS
1.0000 | ORAL_TABLET | Freq: Four times a day (QID) | ORAL | 0 refills | Status: AC | PRN
Start: 2022-07-07 — End: 2022-07-12

## 2022-07-07 NOTE — Progress Notes (Signed)
Discharge instructions and note given to PTAR.

## 2022-07-07 NOTE — TOC Transition Note (Signed)
Transition of Care [facility]) - CM/SW Discharge Note   Patient Details  Name: CALEESI KOHL MRN: [id number] Date of Birth: 1951-04-06  [facility]) CM/SW Contact:  Benard Halsted, LCSW Phone Number: 07/07/2022, 1:30 PM   Clinical Narrative:    Patient will DC to: [facility] Anticipated DC date: 07/07/22 Family notified: Daughter [phone]) Transport by: Corey Harold   Per MD patient ready for DC to [facility]. RN to call report prior to discharge [phone]). RN, patient, patient's family, and facility notified of DC. Discharge Summary and FL2 sent to facility. DC packet on chart including signed script. Ambulance transport requested for patient.   CSW will sign off for now as social work intervention is no longer needed. Please consult [place] again if new needs arise.     Final next level of care: Skilled Nursing Facility Barriers to Discharge: Barriers Resolved   Patient Goals and CMS Choice Patient states their goals for this hospitalization and ongoing recovery are:: SNF CMS Medicare.gov Compare Post Acute Care list provided to:: Patient Choice offered to / list presented to : Patient  Discharge Placement   Existing PASRR number confirmed : 07/07/22          Patient chooses bed at: [facility] Patient to be transferred to facility by: [place] Name of family member notified: Daughter Patient and family notified of of transfer: 07/07/22  Discharge Plan and Services                                     Social Determinants of Health (SDOH) Interventions     Readmission Risk Interventions     No data to display

## 2022-07-07 NOTE — TOC Progression Note (Signed)
Transition of Care [facility]) - Progression Note    Patient Details  Name: [name] MRN: [id number] Date of Birth: 02-Jul-1951  Transition of Care [facility]) CM/SW [place], LCSW Phone Number: 07/07/2022, 9:01 AM  Clinical Narrative:    Insurance approval received for [facility]: Ref#  [id number], Auth ID# [id number], effective 07/06/2022-07/08/2022. [facility] requesting patient arrive before 3:30p to complete admission paperwork.    Expected Discharge Plan: Skilled Nursing Facility Barriers to Discharge: Continued Medical Work up  Expected Discharge Plan and Services Expected Discharge Plan: [place]         Expected Discharge Date: 07/07/22                                     Social Determinants of Health (SDOH) Interventions    Readmission Risk Interventions     No data to display

## 2022-07-07 NOTE — Progress Notes (Signed)
Physical Therapy Treatment Patient Details Name: [name] MRN: [id number] DOB: 21-Jun-1951 Today's Date: 07/07/2022   History of Present Illness Pt is a 71 y/o female admitted secondary to fever and confusion, found to have sepsis and E. Coli. PMH including but not limited to HTN, HLD, COPD on home 02, urinary retention on Flomax.    PT Comments    Pt making steady progress with functional mobility. However, she remains very limited overall secondary to poor pulmonary endurance. She was on 3L of supplemental O2 via [place] throughout with SpO2 decreasing to as low as 79% with very short distance ambulation. SpO2 with very slow recover to 93% with extended seated rest break. Pt would continue to benefit from skilled physical therapy services at this time while admitted and after d/c to address the below listed limitations in order to improve overall safety and independence with functional mobility.    Recommendations for follow up therapy are one component of a multi-disciplinary discharge planning process, led by the attending physician.  Recommendations may be updated based on patient status, additional functional criteria and insurance authorization.  Follow Up Recommendations  Skilled nursing-short term rehab (<3 hours/day) Can patient physically be transported by private vehicle: Yes   Assistance Recommended at Discharge Intermittent Supervision/Assistance  Patient can return home with the following A little help with walking and/or transfers;A little help with bathing/dressing/bathroom;Help with stairs or ramp for entrance;Assist for transportation;Assistance with cooking/housework   Equipment Recommendations  None recommended by PT    Recommendations for Other Services       Precautions / Restrictions Precautions Precautions: Fall Precaution Comments: monitor SpO2 Restrictions Weight Bearing Restrictions: No     Mobility  Bed Mobility               General bed  mobility comments: pt up in chair upon arrival    Transfers Overall transfer level: Needs assistance Equipment used: None Transfers: Sit to/from Stand Sit to Stand: Supervision           General transfer comment: pt performed from recliner chair x1 and from toilet x1 with supervision for safety    Ambulation/Gait Ambulation/Gait assistance: Supervision Gait Distance (Feet): 20 Feet (10' x2 with sitting break on toilet to void) Assistive device: None Gait Pattern/deviations: Step-through pattern, Decreased stride length Gait velocity: decreased     General Gait Details: pt with steady gait overall without an AD; ambulated from the chair to the bathroom and back; significantly limited secondary to very poor pumonary endurance. Pt on 3L of O2 throughout with SpO2 decreasing to as low as 79% with minimal activity   Stairs             Wheelchair Mobility    Modified Rankin (Stroke Patients Only)       Balance Overall balance assessment: Needs assistance Sitting-balance support: Feet supported Sitting balance-Leahy Scale: Good     Standing balance support: During functional activity, No upper extremity supported Standing balance-Leahy Scale: Fair                              Cognition Arousal/Alertness: Awake/alert Behavior During Therapy: Anxious Overall Cognitive Status: Within Functional Limits for tasks assessed                                 General Comments: pt reported hx of claustraphobia  Exercises      General Comments        Pertinent Vitals/Pain Pain Assessment Pain Assessment: No/denies pain    Home Living                          Prior Function            PT Goals (current goals can now be found in the care plan section) Acute Rehab PT Goals PT Goal Formulation: With patient Time For Goal Achievement: 07/18/22 Potential to Achieve Goals: Good Progress towards PT goals: Progressing  toward goals    Frequency    Min 2X/week      PT Plan Current plan remains appropriate    Co-evaluation              AM-PAC PT "6 Clicks" Mobility   Outcome Measure  Help needed turning from your back to your side while in a flat bed without using bedrails?: A Little Help needed moving from lying on your back to sitting on the side of a flat bed without using bedrails?: A Little Help needed moving to and from a bed to a chair (including a wheelchair)?: A Little Help needed standing up from a chair using your arms (e.g., wheelchair or bedside chair)?: A Little Help needed to walk in hospital room?: A Little Help needed climbing 3-5 steps with a railing? : A Lot 6 Click Score: 17    End of Session Equipment Utilized During Treatment: Oxygen Activity Tolerance: Patient tolerated treatment well Patient left: in chair;with call bell/phone within reach;with chair alarm set Nurse Communication: Mobility status PT Visit Diagnosis: Other abnormalities of gait and mobility (R26.89);Muscle weakness (generalized) (M62.81)     Time: 8372-9021 PT Time Calculation (min) (ACUTE ONLY): 13 min  Charges:  $Gait Training: 8-22 mins                     Anastasio Champion, DPT  Acute Rehabilitation Services Office [place] 07/07/2022, 9:21 AM

## 2022-07-07 NOTE — Discharge Summary (Signed)
Physician Discharge Summary  [name] WNU:[id number] DOB: [birth year] DOA: 06/29/2022  PCP: Celene Squibb, MD  Admit date: 06/29/2022 Discharge date: 07/07/2022 Admitted From: Home Disposition: SNF Recommendations for Outpatient Follow-up:  Please obtain BMP and CBC with differential in 1 week Outpatient follow-up with pulmonology about left lower lobe nodule Please follow up on the following pending results: None   Discharge Condition: Stable CODE STATUS: Full code   Hospital course 71 year old F with PMH of COPD/chronic RF on 2 L, diastolic CHF, HTN, chronic back pain, urinary retention and anxiety presenting with progressive confusion and fever, and admitted for severe sepsis and acute metabolic encephalopathy due to acute pyelonephritis and E. coli bacteremia.  Patient was febrile to 101.7 with leukocytosis to 22 and hypotension to 90/41.  She was lethargic and had an AKI with creatinine of 1.3.  She also had elevated troponin to 902 that has trended up to 1114.  EKG without significant acute ischemic finding.  CT head negative.  CT chest abdomen and pelvis with contrast showed 7 mm LLL nodule, left pyelonephritis and minimal fullness of proximal ureter without obstruction.  Cultures obtained.  Foley catheter placed.  Patient was started on IV fluid and broad-spectrum antibiotics and admitted.  Cardiology consulted.    TTE with LVEF of 55 to 60%, G1 DD and moderate TVR.  Nuclear stress test with normal LV perfusion and no evidence of ischemia but poor quality due to patient's movement.    CTA chest PE protocol on 7/8 with increased clustered ill-defined GG nodularity in the superior segment of LLL and scattered tiny centrilobular nodules in both lungs consistent with infectious or inflammatory bronchiolitis and concern for aspiration pneumonia given the small amount of secretion in trachea.  MRI lumbar spine without acute finding or osteomyelitis but multilevel spondylosis and moderate  bilateral neuroforaminal stenosis at L5-S1.   Patient blood culture grew pansensitive E. coli.  Antibiotic de-escalated to IV Ancef.  She is discharged on p.o. cefadroxil for 6 more days to complete treatment course for E. coli bacteremia and pyelonephritis.    Therapy recommended SNF.  See individual problem list below for more.   Problems addressed during this hospitalization Principal Problem:   Severe sepsis ([place]) Active Problems:   Urinary retention   COPD exacerbation (HCC)   Anxiety   Acute on chronic diastolic CHF (congestive heart failure) (HCC)   Troponin level elevated   Acute pyelonephritis   E coli bacteremia   Acute metabolic encephalopathy   Acute on chronic respiratory failure with hypoxia and hypercapnia (HCC)   Urinary tract infection   Chronic lower back pain   Pulmonary nodule   Obesity (BMI 30-39.9)   Normocytic anemia   Severe sepsis due to acute left pyelonephritis and E. coli bacteremia in the setting of urinary retention, and possible aspiration pneumonia: POA.  Patient had fever, leukocytosis, hypotension, AKI and encephalopathy on presentation.  Blood culture with pansensitive E. coli.  Imaging concerning for left pyelonephritis and left ureteral fullness without obstructing stone.  Imaging also raises concern about left lung pneumonia and possible aspiration. -7/5 Vanco and cefepime>> 7/6 ceftriaxone> 7/10 Ancef> 7/13-7/19 cefadroxil -Doxycycline 0/34-7/42   Acute metabolic encephalopathy: Likely due to the above.  She is also at risk for polypharmacy due to pain medications other sedating medications.  Resolved. -Reorientation and delirium precautions -Minimize sedating medications   Possible aspiration pneumonia: CTA chest negative for PE but raises concern for LLL pneumonia with concern for aspiration pneumonia given fluid in  trachea. -Low risk for aspiration per SLP.   Acute on chronic diastolic CHF: Evidence of fluid overload per exam by previous  attending.  Likely in the setting of fluid resuscitation for sepsis.  TTE with normal LVEF, G1 DD and RVSP of 44.  BNP elevated but improved with diuretics.  Diuresed with IV Lasix.  Net -8.5 L.  Appears euvolemic on exam -P.o. Lasix 20 mg daily -Reassess BMP and fluid status at follow-up.   COPD exacerbation: Resolved. -Continue LABA/LAMA/ICS -As needed albuterol -Minimum oxygen to keep saturation above 90%  Acute on chronic hypoxic and hypercarbic respiratory failure/OSA not on CPAP: On 2 L at baseline.  -Minimum oxygen to keep saturation above 90%.  She is high risk for CO2 retention   Acute kidney injury: Resolved. Recent Labs    06/29/22 2057 06/30/22 0451 07/01/22 0207 07/02/22 0122 07/03/22 0221 07/04/22 0049 07/05/22 0231 07/06/22 0301 07/07/22 0035  BUN 26* 25* 30* '23 20 21 19 15 12  '$ CREATININE 1.29* 1.12* 0.94 0.83 0.67 0.91 0.86 0.79 0.64  -Recheck BMP in 1 week   Atypical chest pain/elevated troponin: Troponin elevated to 900 and peaked at 1114.  Demand ischemia?  TTE as above.  Nuclear stress test negative but poor quality.  Chest pain could be due to pneumonia as well.  Symptoms resolved.  Cardiology signed off.  At this point, we can say non-STEMI ruled out. -Continue aspirin and statin.   Essential hypertension: Normotensive off home Hyzaar. -Continue home Cardizem CD -P.o. Lasix 20 mg daily   Urinary retention: History of this.  She had Foley placed in ED. -Continue Flomax and bethanechol -Passed voiding trial.   Chronic low back pain-lumbar MRI without acute finding but spondylolysis with moderate bilateral neuroforaminal stenosis at L5-S1. -Continue therapy at SNF -Continue home Norco but not a great choice with her respiratory issue.   Normocytic anemia: Stable. -Recheck CBC at follow-up   8 mm ground-glass nodule in the left lower lobe.  PCP to arrange outpatient pulmonary follow-up within 1 to 2 weeks of discharge for follow-up and monitoring.    Obesity Body mass index is 32.26 kg/m.             Vital signs Vitals:   07/07/22 0335 07/07/22 0720 07/07/22 0808 07/07/22 1000  BP: (!) 126/57 (!) 156/70    Pulse: 72 99    Temp: 97.8 F (36.6 C) 97.8 F (36.6 C)    Resp: 20 16    Height:      Weight:      SpO2: 98% 92% 97% 96%  TempSrc: Oral Oral    BMI (Calculated):         Discharge exam  GENERAL: No apparent distress.  Nontoxic. HEENT: MMM.  Vision and hearing grossly intact.  NECK: Supple.  No apparent JVD.  RESP:  No IWOB.  Fair aeration bilaterally. CVS:  RRR. Heart sounds normal.  ABD/GI/GU: BS+. Abd soft, NTND.  MSK/EXT:  Moves extremities. No apparent deformity. No edema.  SKIN: no apparent skin lesion or wound NEURO: Awake and alert. Oriented appropriately.  No apparent focal neuro deficit. PSYCH: Calm. Normal affect.  Discharge Instructions Discharge Instructions     Diet - low sodium heart healthy   Complete by: As directed    Increase activity slowly   Complete by: As directed       Allergies as of 07/07/2022   No Known Allergies      Medication List     STOP taking these medications  losartan-hydrochlorothiazide 100-25 MG tablet Commonly known as: HYZAAR       TAKE these medications    albuterol 108 (90 Base) MCG/ACT inhaler Commonly known as: VENTOLIN HFA Inhale 2 puffs into the lungs every 6 (six) hours as needed for wheezing or shortness of breath. For shortness of breath   aspirin EC 81 MG tablet Take 81 mg by mouth in the morning. Swallow whole.   atorvastatin 80 MG tablet Commonly known as: LIPITOR Take 1 tablet (80 mg total) by mouth daily.   bethanechol 10 MG tablet Commonly known as: URECHOLINE Take 1 tablet (10 mg total) by mouth 3 (three) times daily.   cefadroxil 1 g tablet Commonly known as: DURICEF Take 1 tablet (1 g total) by mouth 2 (two) times daily for 6 days.   cyanocobalamin 1000 MCG tablet Take 1 tablet (1,000 mcg total) by mouth daily.    diltiazem 180 MG 24 hr capsule Commonly known as: CARDIZEM CD Take 1 capsule (180 mg total) by mouth daily. Start taking on: July 08, 2022 What changed: when to take this   doxycycline 100 MG tablet Commonly known as: VIBRA-TABS Take 1 tablet (100 mg total) by mouth every 12 (twelve) hours for 2 days.   furosemide 20 MG tablet Commonly known as: LASIX Take 1 tablet (20 mg total) by mouth daily. Start taking on: July 08, 2022   HYDROcodone-acetaminophen 10-325 MG tablet Commonly known as: NORCO Take 1 tablet by mouth every 6 (six) hours as needed for up to 5 days for moderate pain or severe pain. What changed:  when to take this reasons to take this   melatonin 5 MG Tabs Take 1 tablet (5 mg total) by mouth once as needed.   multivitamin with minerals Tabs tablet Take 1 tablet by mouth in the morning.   pantoprazole 20 MG tablet Commonly known as: PROTONIX Take 1 tablet (20 mg total) by mouth daily. Start taking on: July 08, 2022   polyethylene glycol 17 g packet Commonly known as: MIRALAX / GLYCOLAX Take 17 g by mouth daily as needed. Sometimes forgets to take everyday.   senna-docusate 8.6-50 MG tablet Commonly known as: Senokot-S Take 1 tablet by mouth at bedtime.   tamsulosin 0.4 MG Caps capsule Commonly known as: FLOMAX Take 1 capsule (0.4 mg total) by mouth daily after supper. What changed: See the new instructions.   Trelegy Ellipta 100-62.5-25 MCG/ACT Aepb Generic drug: Fluticasone-Umeclidin-Vilant Take 1 puff by mouth in the morning.        Consultations: Cardiology  Procedures/Studies:   DG Chest Port 1 View  Result Date: 07/05/2022 CLINICAL DATA:  71 year old female with history of shortness of breath and low back pain. EXAM: PORTABLE CHEST 1 VIEW COMPARISON:  Chest x-ray 06/29/2022. FINDINGS: Extensive chronic scarring and volume loss in the right mid to lower lung, stable compared to prior examination. Left lung is clear. No definite pleural  effusions. No pneumothorax. No evidence of pulmonary edema. Heart size is mildly enlarged. Atherosclerotic calcifications in the thoracic aorta. IMPRESSION: 1. No definite radiographic evidence of acute cardiopulmonary disease. 2. Mild cardiomegaly. 3. Extensive chronic scarring in the right mid to lower lung, similar to prior studies. Electronically Signed   By: Vinnie Langton M.D.   On: 07/05/2022 06:56   NM Myocar Multi W/Spect Tamela Oddi Motion / EF  Result Date: 07/04/2022   Poor quality study.  There is noticeable patient movement during the exam creating a defect completely horizontal to the left ventricle.  This defect  does not match any coronary distribution.  The defect is fixed.  There is clear motion.  To me this is likely motion artifact.  Perfusion appears to be normal with no evidence of infarction.  LV perfusion is normal. There is no evidence of ischemia. There is no evidence of infarction.   Left ventricular function is abnormal. Nuclear stress EF: 43 %. The left ventricular ejection fraction is moderately decreased (30-44%). End diastolic cavity size is mildly enlarged.   The study is likely normal. The study is intermediate risk based on ejection fraction of 43%.  However significant cardiac motion artifact noted.  Would defer to echocardiogram regarding accurate assessment.   MR Lumbar Spine W Wo Contrast  Result Date: 07/03/2022 CLINICAL DATA:  Physiologic. EXAM: MRI LUMBAR SPINE WITHOUT AND WITH CONTRAST TECHNIQUE: Multiplanar and multiecho pulse sequences of the lumbar spine were obtained without and with intravenous contrast. CONTRAST:  7.28m GADAVIST GADOBUTROL 1 MMOL/ML IV SOLN COMPARISON:  CT abdomen pelvis dated June 30, 2022. FINDINGS: Segmentation:  Standard. Alignment: Unchanged trace stepwise retrolisthesis from L1-L2 through L4-L5. Vertebrae:  No fracture, evidence of discitis, or bone lesion. Conus medullaris and cauda equina: Conus extends to the L1-L2 level. Conus and cauda  equina appear normal. No abnormal intrathecal enhancement. Paraspinal and other soft tissues: Negative. Known left pyelonephritis is better evaluated on CT. Disc levels: T12-L1:  Negative. L1-L2:  Mild disc bulging.  No stenosis. L2-L3: Mild disc bulging and left facet arthropathy. Mild left lateral recess stenosis. No spinal canal or neuroforaminal stenosis. L3-L4: Mild disc bulging and bilateral facet arthropathy. Mild bilateral lateral recess stenosis. No spinal canal or neuroforaminal stenosis. L4-L5: Mild-to-moderate disc bulging eccentric to the left with left far lateral disc osteophyte complex. Mild bilateral facet arthropathy. Moderate left lateral recess stenosis. No spinal canal or neuroforaminal stenosis. L5-S1: Mild disc bulging with superimposed small central disc protrusion. Mild bilateral facet arthropathy. Moderate bilateral neuroforaminal stenosis. No spinal canal stenosis. IMPRESSION: 1. No acute abnormality.  No osteomyelitis discitis. 2. Multilevel lumbar spondylosis as described above. Moderate bilateral neuroforaminal stenosis at L5-S1. Electronically Signed   By: WTitus DubinM.D.   On: 07/03/2022 18:10   DG Abd 1 View  Result Date: 07/03/2022 CLINICAL DATA:  Abdominal pain. EXAM: ABDOMEN - 1 VIEW COMPARISON:  CT, 06/30/2022. FINDINGS: Normal bowel gas pattern.  No evidence of renal or ureteral stones. No acute skeletal abnormality. IMPRESSION: No acute findings. Electronically Signed   By: DLajean ManesM.D.   On: 07/03/2022 10:08   CT Angio Chest Pulmonary Embolism (PE) W or WO Contrast  Result Date: 07/02/2022 CLINICAL DATA:  Sepsis due to left pyelonephritis. Acute on chronic respiratory failure. EXAM: CT ANGIOGRAPHY CHEST WITH CONTRAST TECHNIQUE: Multidetector CT imaging of the chest was performed using the standard protocol during bolus administration of intravenous contrast. Multiplanar CT image reconstructions and MIPs were obtained to evaluate the vascular anatomy. RADIATION  DOSE REDUCTION: This exam was performed according to the departmental dose-optimization program which includes automated exposure control, adjustment of the mA and/or kV according to patient size and/or use of iterative reconstruction technique. CONTRAST:  1254mOMNIPAQUE IOHEXOL 350 MG/ML SOLN COMPARISON:  CT chest dated June 30, 2022. FINDINGS: Cardiovascular: Satisfactory opacification of the pulmonary arteries to the segmental level. No evidence of pulmonary embolism. Unchanged mild cardiomegaly. No pericardial effusion. No thoracic aortic aneurysm or dissection. Coronary, aortic arch, and branch vessel atherosclerotic vascular disease. Mediastinum/Nodes: Borderline enlarged right hilar lymph node measuring up to 9 mm in short axis,  unchanged. No enlarged mediastinal or axillary lymph nodes. The thyroid gland and esophagus demonstrate no significant findings. Lungs/Pleura: Small amount of secretions in the trachea. New trace left pleural effusion. Increased clustered ill-defined ground-glass nodularity in the superior segment of the left lower lobe (series 8, image 39). Unchanged 8 mm ground-glass nodule in the left lower lobe (series 8, image 68). Scattered tiny centrilobular nodules in both lungs again noted. Increased subsegmental atelectasis in the left lower lobe. Similar scarring and architectural distortion with volume loss in the right lung. Chronic right pleural thickening and calcification again noted. Moderate centrilobular emphysema. Upper Abdomen: Unchanged patchy enhancement of the left kidney. Unchanged 1.5 cm left adrenal benign adenoma. No follow-up imaging is recommended. Musculoskeletal: No chest wall abnormality. No acute or significant osseous findings. Review of the MIP images confirms the above findings. IMPRESSION: 1. No evidence of pulmonary embolism. 2. Increased clustered ill-defined ground-glass nodularity in the superior segment of the left lower lobe, with otherwise similar scattered  tiny centrilobular nodules in both, most lungs consistent with infectious or inflammatory bronchiolitis, possibly aspiration related given small amount of secretions in the trachea. 3. New trace left pleural effusion. 4. Unchanged 8 mm ground-glass nodule in the left lower lobe. Initial follow-up with CT at 6 months is recommended to confirm persistence. If persistent, repeat CT is recommended every 2 years until 5 years of stability has been established. This recommendation follows the consensus statement: Guidelines for Management of Incidental Pulmonary Nodules Detected on CT Images: From the Fleischner Society 2017; Radiology 2017; 284:228-243. 5. Unchanged left pyelonephritis. 6. Aortic Atherosclerosis (ICD10-I70.0) and Emphysema (ICD10-J43.9). Electronically Signed   By: Titus Dubin M.D.   On: 07/02/2022 11:35   ECHOCARDIOGRAM COMPLETE  Result Date: 06/30/2022    ECHOCARDIOGRAM REPORT   Patient Name:   CRISSA SOWDER Date of Exam: 06/30/2022 Medical Rec #:  [id number]        Height:       63.0 in Accession #:    [id number]       Weight:       160.1 lb Date of Birth:  08-24-1951        BSA:          1.759 m Patient Age:    32 years         BP:           116/57 mmHg Patient Gender: F                HR:           84 bpm. Exam Location:  Inpatient Procedure: 2D Echo, Cardiac Doppler and Color Doppler Indications:    Elevated Troponin  History:        Patient has no prior history of Echocardiogram examinations.                 CHF, COPD; Risk Factors:Hypertension.  Sonographer:    Bernadene Person RDCS Referring Phys: [id number] [place]  1. Left ventricular ejection fraction, by estimation, is 55 to 60%. The left ventricle has normal function. The left ventricle has no regional wall motion abnormalities. Left ventricular diastolic parameters are consistent with Grade I diastolic dysfunction (impaired relaxation).  2. Right ventricular systolic function is normal. The right ventricular size is  moderately enlarged. There is mildly elevated pulmonary artery systolic pressure. The estimated right ventricular systolic pressure is 09.7 mmHg.  3. Left atrial size was mildly dilated.  4. The mitral valve is normal in  structure. No evidence of mitral valve regurgitation. No evidence of mitral stenosis.  5. Tricuspid valve regurgitation is moderate.  6. The aortic valve is normal in structure. Aortic valve regurgitation is not visualized. No aortic stenosis is present.  7. The inferior vena cava is normal in size with greater than 50% respiratory variability, suggesting right atrial pressure of 3 mmHg. FINDINGS  Left Ventricle: Left ventricular ejection fraction, by estimation, is 55 to 60%. The left ventricle has normal function. The left ventricle has no regional wall motion abnormalities. The left ventricular internal cavity size was normal in size. There is  no left ventricular hypertrophy. Left ventricular diastolic parameters are consistent with Grade I diastolic dysfunction (impaired relaxation). Right Ventricle: The right ventricular size is moderately enlarged. No increase in right ventricular wall thickness. Right ventricular systolic function is normal. There is mildly elevated pulmonary artery systolic pressure. The tricuspid regurgitant velocity is 3.22 m/s, and with an assumed right atrial pressure of 3 mmHg, the estimated right ventricular systolic pressure is 91.4 mmHg. Left Atrium: Left atrial size was mildly dilated. Right Atrium: Right atrial size was normal in size. Pericardium: There is no evidence of pericardial effusion. Mitral Valve: The mitral valve is normal in structure. There is mild thickening of the mitral valve leaflet(s). There is mild calcification of the mitral valve leaflet(s). Mild mitral annular calcification. No evidence of mitral valve regurgitation. No evidence of mitral valve stenosis. Tricuspid Valve: The tricuspid valve is normal in structure. Tricuspid valve  regurgitation is moderate . No evidence of tricuspid stenosis. Aortic Valve: The aortic valve is normal in structure. Aortic valve regurgitation is not visualized. No aortic stenosis is present. Pulmonic Valve: The pulmonic valve was normal in structure. Pulmonic valve regurgitation is not visualized. No evidence of pulmonic stenosis. Aorta: The aortic root is normal in size and structure. Venous: The inferior vena cava is normal in size with greater than 50% respiratory variability, suggesting right atrial pressure of 3 mmHg. IAS/Shunts: No atrial level shunt detected by color flow Doppler.  LEFT VENTRICLE PLAX 2D LVIDd:         5.00 cm      Diastology LVIDs:         3.10 cm      LV e' medial:    5.33 cm/s LV PW:         0.80 cm      LV E/e' medial:  23.1 LV IVS:        0.90 cm      LV e' lateral:   5.85 cm/s LVOT diam:     1.80 cm      LV E/e' lateral: 21.0 LV SV:         55 LV SV Index:   31 LVOT Area:     2.54 cm  LV Volumes (MOD) LV vol d, MOD A2C: 120.0 ml LV vol d, MOD A4C: 67.1 ml LV vol s, MOD A2C: 56.5 ml LV vol s, MOD A4C: 23.8 ml LV SV MOD A2C:     63.5 ml LV SV MOD A4C:     67.1 ml LV SV MOD BP:      59.7 ml RIGHT VENTRICLE RV S prime:     10.70 cm/s TAPSE (M-mode): 2.6 cm LEFT ATRIUM             Index        RIGHT ATRIUM           Index LA diam:  6.20 cm 3.52 cm/m   RA Area:     15.90 cm LA Vol (A2C):   64.5 ml 36.67 ml/m  RA Volume:   52.10 ml  29.62 ml/m LA Vol (A4C):   61.0 ml 34.68 ml/m LA Biplane Vol: 66.7 ml 37.92 ml/m  AORTIC VALVE LVOT Vmax:   107.00 cm/s LVOT Vmean:  70.600 cm/s LVOT VTI:    0.215 m  AORTA Ao Root diam: 2.90 cm Ao Asc diam:  2.90 cm MITRAL VALVE                TRICUSPID VALVE MV Area (PHT): 4.06 cm     TR Peak grad:   41.5 mmHg MV Decel Time: 187 msec     TR Vmax:        322.00 cm/s MV E velocity: 123.00 cm/s MV A velocity: 127.00 cm/s  SHUNTS MV E/A ratio:  0.97         Systemic VTI:  0.22 m                             Systemic Diam: 1.80 cm Candee Furbish MD  Electronically signed by Candee Furbish MD Signature Date/Time: 06/30/2022/12:03:16 PM    Final    CT HEAD WO CONTRAST (5MM)  Result Date: 06/30/2022 CLINICAL DATA:  Delirium. EXAM: CT HEAD WITHOUT CONTRAST TECHNIQUE: Contiguous axial images were obtained from the base of the skull through the vertex without intravenous contrast. RADIATION DOSE REDUCTION: This exam was performed according to the departmental dose-optimization program which includes automated exposure control, adjustment of the mA and/or kV according to patient size and/or use of iterative reconstruction technique. COMPARISON:  None Available. FINDINGS: Brain: No evidence of acute infarction, hemorrhage, hydrocephalus, extra-axial collection or mass lesion/mass effect. Vascular: No hyperdense vessel or unexpected calcification. Skull: Normal. Negative for fracture or focal lesion. Sinuses/Orbits: No acute finding. Other: None. IMPRESSION: No acute intracranial pathology. Electronically Signed   By: Virgina Norfolk M.D.   On: 06/30/2022 03:34   CT CHEST ABDOMEN PELVIS W CONTRAST  Result Date: 06/30/2022 CLINICAL DATA:  Possible sepsis EXAM: CT CHEST, ABDOMEN, AND PELVIS WITH CONTRAST TECHNIQUE: Multidetector CT imaging of the chest, abdomen and pelvis was performed following the standard protocol during bolus administration of intravenous contrast. RADIATION DOSE REDUCTION: This exam was performed according to the departmental dose-optimization program which includes automated exposure control, adjustment of the mA and/or kV according to patient size and/or use of iterative reconstruction technique. CONTRAST:  37m OMNIPAQUE IOHEXOL 300 MG/ML  SOLN COMPARISON:  Chest x-ray from earlier in the same day, CT from 07/25/2018, 06/25/2020, 03/20/2013 FINDINGS: CT CHEST FINDINGS Cardiovascular: Atherosclerotic calcifications of the aorta are noted without aneurysmal dilatation. The heart is at the upper limits of normal in size. Pulmonary artery shows no  central pulmonary embolus although not timed for embolus evaluation. Mediastinum/Nodes: Thoracic inlet is within normal limits. No hilar or mediastinal adenopathy is noted. The esophagus is within normal limits. Lungs/Pleura: Left lung is hyperinflated with emphysematous change. Mild left basilar atelectatic changes are seen. 7 mm nodule is noted in the left lower lobe best seen on image number 86 of series 5. Chronic scarring is noted in the right lung base and slightly progressed when compared with the prior CT from 2019. Calcified pleural plaques are noted. No acute infiltrate or sizable effusion is noted. Emphysematous changes are noted within the right lung as well. Musculoskeletal: Degenerative changes of the thoracic spine are  seen. No acute rib abnormality is noted. CT ABDOMEN PELVIS FINDINGS Hepatobiliary: No focal liver abnormality is seen. No gallstones, gallbladder wall thickening, or biliary dilatation. Pancreas: Unremarkable. No pancreatic ductal dilatation or surrounding inflammatory changes. Spleen: Normal in size without focal abnormality. Adrenals/Urinary Tract: Adrenal glands are well visualized with a stable 16 mm nodule in the left adrenal similar to that seen on prior exam (2014) consistent with adenoma. Kidneys are well visualized bilaterally. The left kidney is mildly enlarged with some patchy decreased enhancement identified likely related to pyelonephritis. The right kidney shows a normal enhancement pattern. No obstructive changes are seen. The bladder is within normal limits. Stomach/Bowel: Scattered diverticular change of the colon is noted. No diverticulitis is seen. The appendix is within normal limits. Small bowel and stomach are unremarkable. Vascular/Lymphatic: Aortic atherosclerosis. No enlarged abdominal or pelvic lymph nodes. Reproductive: Status post hysterectomy. No adnexal masses. Other: No abdominal wall hernia or abnormality. No abdominopelvic ascites. Musculoskeletal: No  acute bony abnormality is noted. IMPRESSION: CT of the chest: Chronic changes in the right lung with progressive scarring in the right lower lobe as well as calcified pleural plaques. 7 mm left lower lobe nodule. Recommend a non-contrast Chest CT at 6-12 months, then another non-contrast Chest CT at 18-24 months. These guidelines do not apply to immunocompromised patients and patients with cancer. Follow up in patients with significant comorbidities as clinically warranted. For lung cancer screening, adhere to Lung-RADS guidelines. Reference: Radiology. 2017; 284(1):228-43. CT of the abdomen and pelvis: Patchy enhancement in the left kidney consistent with pyelonephritis. Minimal fullness of the proximal ureter is seen although no obstructing lesion is noted. Diverticulosis without diverticulitis. Stable left adrenal lesion dating back to 2014 consistent with adenoma. Aortic Atherosclerosis (ICD10-I70.0) and Emphysema (ICD10-J43.9). Electronically Signed   By: Inez Catalina M.D.   On: 06/30/2022 01:25   DG Chest Port 1 View  Result Date: 06/29/2022 CLINICAL DATA:  Questionable sepsis - evaluate for abnormality EXAM: PORTABLE CHEST 1 VIEW COMPARISON:  Chest radiograph 08/15/2021.  CT 07/25/2018 FINDINGS: Chronic volume loss in the right hemithorax with scarring at the right lung base. Mild cardiomegaly with stable mediastinal contours. Chronic bronchial thickening, emphysema on prior CT. Streaky atelectasis at the left lung base. No pneumothorax or pleural effusion. No acute osseous abnormalities. IMPRESSION: 1. Chronic volume loss in the right hemithorax with scarring at the right lung base. 2. Stable mild cardiomegaly. Streaky atelectasis at the left lung base. Electronically Signed   By: Keith Rake M.D.   On: 06/29/2022 21:18       The results of significant diagnostics from this hospitalization (including imaging, microbiology, ancillary and laboratory) are listed below for reference.      Microbiology: Recent Results (from the past 240 hour(s))  Resp Panel by RT-PCR (Flu A&B, Covid) Anterior Nasal Swab     Status: None   Collection Time: 06/29/22  8:54 PM   Specimen: Anterior Nasal Swab  Result Value Ref Range Status   SARS Coronavirus 2 by RT PCR NEGATIVE NEGATIVE Final    Comment: (NOTE) SARS-CoV-2 target nucleic acids are NOT DETECTED.  The SARS-CoV-2 RNA is generally detectable in upper respiratory specimens during the acute phase of infection. The lowest concentration of SARS-CoV-2 viral copies this assay can detect is 138 copies/mL. A negative result does not preclude SARS-Cov-2 infection and should not be used as the sole basis for treatment or other patient management decisions. A negative result may occur with  improper specimen collection/handling, submission of specimen other  than nasopharyngeal swab, presence of viral mutation(s) within the areas targeted by this assay, and inadequate number of viral copies(<138 copies/mL). A negative result must be combined with clinical observations, patient history, and epidemiological information. The expected result is Negative.  Fact Sheet for Patients:  EntrepreneurPulse.com.au  Fact Sheet for Healthcare Providers:  IncredibleEmployment.be  This test is no t yet approved or cleared by the [place] FDA and  has been authorized for detection and/or diagnosis of SARS-CoV-2 by FDA under an Emergency Use Authorization (EUA). This EUA will remain  in effect (meaning this test can be used) for the duration of the COVID-19 declaration under Section 564(b)(1) of the Act, 21 U.S.C.section 360bbb-3(b)(1), unless the authorization is terminated  or revoked sooner.       Influenza A by PCR NEGATIVE NEGATIVE Final   Influenza B by PCR NEGATIVE NEGATIVE Final    Comment: (NOTE) The Xpert Xpress SARS-CoV-2/FLU/RSV plus assay is intended as an aid in the diagnosis of influenza  from Nasopharyngeal swab specimens and should not be used as a sole basis for treatment. Nasal washings and aspirates are unacceptable for Xpert Xpress SARS-CoV-2/FLU/RSV testing.  Fact Sheet for Patients: EntrepreneurPulse.com.au  Fact Sheet for Healthcare Providers: IncredibleEmployment.be  This test is not yet approved or cleared by the [place] FDA and has been authorized for detection and/or diagnosis of SARS-CoV-2 by FDA under an Emergency Use Authorization (EUA). This EUA will remain in effect (meaning this test can be used) for the duration of the COVID-19 declaration under Section 564(b)(1) of the Act, 21 U.S.C. section 360bbb-3(b)(1), unless the authorization is terminated or revoked.  Performed at [facility], [place] [address] [place], [place] [address]   Blood Culture (routine x 2)     Status: Abnormal   Collection Time: 06/29/22  8:54 PM   Specimen: BLOOD  Result Value Ref Range Status   Specimen Description BLOOD SITE NOT SPECIFIED  Final   Special Requests   Final    BOTTLES DRAWN AEROBIC AND ANAEROBIC Blood Culture adequate volume   Culture  Setup Time   Final    GRAM NEGATIVE RODS IN BOTH AEROBIC AND ANAEROBIC BOTTLES CRITICAL RESULT CALLED TO, READ BACK BY AND VERIFIED WITH: [facility] [place] [id number] AT 6213 BY EC Performed at [facility], [place] [address] [place], [place] [address]    Culture ESCHERICHIA COLI (A)  Final   Report Status 07/02/2022 FINAL  Final   Organism ID, Bacteria ESCHERICHIA COLI  Final      Susceptibility   Escherichia coli - MIC*    AMPICILLIN <=2 SENSITIVE Sensitive     CEFAZOLIN <=4 SENSITIVE Sensitive     CEFEPIME <=0.12 SENSITIVE Sensitive     CEFTAZIDIME <=1 SENSITIVE Sensitive     CEFTRIAXONE <=0.25 SENSITIVE Sensitive     CIPROFLOXACIN <=0.25 SENSITIVE Sensitive     GENTAMICIN <=1 SENSITIVE Sensitive     IMIPENEM <=0.25 SENSITIVE Sensitive     TRIMETH/SULFA <=20  SENSITIVE Sensitive     AMPICILLIN/SULBACTAM <=2 SENSITIVE Sensitive     PIP/TAZO <=4 SENSITIVE Sensitive     * ESCHERICHIA COLI  Blood Culture ID Panel (Reflexed)     Status: Abnormal   Collection Time: 06/29/22  8:54 PM  Result Value Ref Range Status   Enterococcus faecalis NOT DETECTED NOT DETECTED Final   Enterococcus Faecium NOT DETECTED NOT DETECTED Final   Listeria monocytogenes NOT DETECTED NOT DETECTED Final   Staphylococcus species NOT DETECTED NOT DETECTED Final  Staphylococcus aureus (BCID) NOT DETECTED NOT DETECTED Final   Staphylococcus epidermidis NOT DETECTED NOT DETECTED Final   Staphylococcus lugdunensis NOT DETECTED NOT DETECTED Final   Streptococcus species NOT DETECTED NOT DETECTED Final   Streptococcus agalactiae NOT DETECTED NOT DETECTED Final   Streptococcus pneumoniae NOT DETECTED NOT DETECTED Final   Streptococcus pyogenes NOT DETECTED NOT DETECTED Final   A.calcoaceticus-baumannii NOT DETECTED NOT DETECTED Final   Bacteroides fragilis NOT DETECTED NOT DETECTED Final   Enterobacterales DETECTED (A) NOT DETECTED Final    Comment: Enterobacterales represent a large order of gram negative bacteria, not a single organism. CRITICAL RESULT CALLED TO, READ BACK BY AND VERIFIED WITH: PHARMD EMILY SINCLAIR [id number] AT 1937 BY EC    Enterobacter cloacae complex NOT DETECTED NOT DETECTED Final   Escherichia coli DETECTED (A) NOT DETECTED Final    Comment: CRITICAL RESULT CALLED TO, READ BACK BY AND VERIFIED WITH: PHARMD EMILY SINCLAIR [id number] AT 1242 BY EC    Klebsiella aerogenes NOT DETECTED NOT DETECTED Final   Klebsiella oxytoca NOT DETECTED NOT DETECTED Final   Klebsiella pneumoniae NOT DETECTED NOT DETECTED Final   Proteus species NOT DETECTED NOT DETECTED Final   Salmonella species NOT DETECTED NOT DETECTED Final   Serratia marcescens NOT DETECTED NOT DETECTED Final   Haemophilus influenzae NOT DETECTED NOT DETECTED Final   Neisseria meningitidis NOT DETECTED  NOT DETECTED Final   Pseudomonas aeruginosa NOT DETECTED NOT DETECTED Final   Stenotrophomonas maltophilia NOT DETECTED NOT DETECTED Final   Candida albicans NOT DETECTED NOT DETECTED Final   Candida auris NOT DETECTED NOT DETECTED Final   Candida glabrata NOT DETECTED NOT DETECTED Final   Candida krusei NOT DETECTED NOT DETECTED Final   Candida parapsilosis NOT DETECTED NOT DETECTED Final   Candida tropicalis NOT DETECTED NOT DETECTED Final   Cryptococcus neoformans/gattii NOT DETECTED NOT DETECTED Final   CTX-M ESBL NOT DETECTED NOT DETECTED Final   Carbapenem resistance IMP NOT DETECTED NOT DETECTED Final   Carbapenem resistance KPC NOT DETECTED NOT DETECTED Final   Carbapenem resistance NDM NOT DETECTED NOT DETECTED Final   Carbapenem resist OXA 48 LIKE NOT DETECTED NOT DETECTED Final   Carbapenem resistance VIM NOT DETECTED NOT DETECTED Final    Comment: Performed at [facility], [address] [place], [place] [address]  Blood Culture (routine x 2)     Status: Abnormal   Collection Time: 06/29/22  9:03 PM   Specimen: BLOOD  Result Value Ref Range Status   Specimen Description BLOOD RIGHT ANTECUBITAL  Final   Special Requests   Final    BOTTLES DRAWN AEROBIC AND ANAEROBIC Blood Culture adequate volume   Culture  Setup Time   Final    GRAM NEGATIVE RODS ANAEROBIC BOTTLE ONLY CRITICAL VALUE NOTED.  VALUE IS CONSISTENT WITH PREVIOUSLY REPORTED AND CALLED VALUE.    Culture (A)  Final    ESCHERICHIA COLI SUSCEPTIBILITIES PERFORMED ON PREVIOUS CULTURE WITHIN THE LAST 5 DAYS. Performed at [facility], [place] [address], [place], [place] [address]    Report Status 07/02/2022 FINAL  Final  Urine Culture     Status: None   Collection Time: 06/30/22  8:00 AM   Specimen: In/Out Cath Urine  Result Value Ref Range Status   Specimen Description IN/OUT CATH URINE  Final   Special Requests NONE  Final   Culture   Final    NO GROWTH Performed at [facility], [place] [address] [place], [place] [address]  Report Status 07/01/2022 FINAL  Final     Labs:  CBC: Recent Labs  Lab 07/02/22 0122 07/03/22 0221 07/04/22 0049 07/05/22 0231 07/06/22 0301 07/07/22 0035  WBC 12.6* 10.6* 10.7* 13.6* 14.0* 13.1*  NEUTROABS 10.8* 7.7 7.4 9.4* 9.3*  --   HGB 8.8* 9.0* 9.3* 10.0* 10.2* 9.7*  HCT 30.0* 30.5* 31.6* 32.7* 33.9* 31.1*  MCV 97.7 98.4 96.6 95.3 94.2 94.2  PLT 180 177 191 184 207 214   BMP &GFR Recent Labs  Lab 07/01/22 0207 07/02/22 0122 07/03/22 0221 07/04/22 0049 07/05/22 0231 07/06/22 0301 07/07/22 0035  NA 137   < > 141 141 141 138 138  K 3.7   < > 4.8 4.2 4.2 4.1 4.1  CL 94*   < > 92* 90* 87* 87* 87*  CO2 33*   < > 41* 44* 42* 41* >45*  GLUCOSE 155*   < > 96 95 93 101* 93  BUN 30*   < > '20 21 19 15 12  '$ CREATININE 0.94   < > 0.67 0.91 0.86 0.79 0.64  CALCIUM 8.4*   < > 8.6* 8.4* 8.3* 8.2* 8.3*  MG 2.4  --   --  1.9 1.8 1.7 1.6*  PHOS  --   --   --   --   --   --  4.1   < > = values in this interval not displayed.   Estimated Creatinine Clearance: 68.5 mL/min (by C-G formula based on SCr of 0.64 mg/dL). Liver & Pancreas: Recent Labs  Lab 07/07/22 0035  ALBUMIN 2.7*   No results for input(s): "LIPASE", "AMYLASE" in the last 168 hours. No results for input(s): "AMMONIA" in the last 168 hours. Diabetic: No results for input(s): "HGBA1C" in the last 72 hours. No results for input(s): "GLUCAP" in the last 168 hours. Cardiac Enzymes: No results for input(s): "CKTOTAL", "CKMB", "CKMBINDEX", "TROPONINI" in the last 168 hours. No results for input(s): "PROBNP" in the last 8760 hours. Coagulation Profile: No results for input(s): "INR", "PROTIME" in the last 168 hours. Thyroid Function Tests: No results for input(s): "TSH", "T4TOTAL", "FREET4", "T3FREE", "THYROIDAB" in the last 72 hours. Lipid Profile: No results for input(s): "CHOL", "HDL", "LDLCALC", "TRIG", "CHOLHDL", "LDLDIRECT" in the last 72 hours. Anemia Panel: Recent Labs     07/07/22 0035  VITAMINB12 1,078*  FOLATE 17.4  FERRITIN 59  TIBC 277  IRON 36  RETICCTPCT 1.6   Urine analysis:    Component Value Date/Time   COLORURINE YELLOW 06/30/2022 0813   APPEARANCEUR HAZY (A) 06/30/2022 0813   APPEARANCEUR Clear 03/02/2021 1629   LABSPEC 1.023 06/30/2022 0813   PHURINE 6.0 06/30/2022 0813   GLUCOSEU NEGATIVE 06/30/2022 0813   HGBUR MODERATE (A) 06/30/2022 0813   BILIRUBINUR NEGATIVE 06/30/2022 0813   BILIRUBINUR Negative 03/02/2021 1629   KETONESUR NEGATIVE 06/30/2022 0813   PROTEINUR NEGATIVE 06/30/2022 0813   UROBILINOGEN 0.2 08/17/2011 1302   NITRITE NEGATIVE 06/30/2022 0813   LEUKOCYTESUR MODERATE (A) 06/30/2022 0813   Sepsis Labs: Invalid input(s): "PROCALCITONIN", "LACTICIDVEN"   SIGNED:  Mercy Riding, MD  [facility] 07/07/2022, 12:02 PM

## 2022-07-08 DIAGNOSIS — M545 Low back pain, unspecified: Secondary | ICD-10-CM | POA: Diagnosis not present

## 2022-07-08 DIAGNOSIS — K219 Gastro-esophageal reflux disease without esophagitis: Secondary | ICD-10-CM | POA: Diagnosis not present

## 2022-07-08 DIAGNOSIS — I10 Essential (primary) hypertension: Secondary | ICD-10-CM | POA: Diagnosis not present

## 2022-07-08 DIAGNOSIS — I5030 Unspecified diastolic (congestive) heart failure: Secondary | ICD-10-CM | POA: Diagnosis not present

## 2022-07-08 DIAGNOSIS — D649 Anemia, unspecified: Secondary | ICD-10-CM | POA: Diagnosis not present

## 2022-07-08 DIAGNOSIS — J9690 Respiratory failure, unspecified, unspecified whether with hypoxia or hypercapnia: Secondary | ICD-10-CM | POA: Diagnosis not present

## 2022-07-08 DIAGNOSIS — J449 Chronic obstructive pulmonary disease, unspecified: Secondary | ICD-10-CM | POA: Diagnosis not present

## 2022-07-08 DIAGNOSIS — K5900 Constipation, unspecified: Secondary | ICD-10-CM | POA: Diagnosis not present

## 2022-07-08 DIAGNOSIS — E785 Hyperlipidemia, unspecified: Secondary | ICD-10-CM | POA: Diagnosis not present

## 2022-07-08 DIAGNOSIS — A419 Sepsis, unspecified organism: Secondary | ICD-10-CM | POA: Diagnosis not present

## 2022-07-08 DIAGNOSIS — R911 Solitary pulmonary nodule: Secondary | ICD-10-CM | POA: Diagnosis not present

## 2022-07-11 DIAGNOSIS — M545 Low back pain, unspecified: Secondary | ICD-10-CM | POA: Diagnosis not present

## 2022-07-11 DIAGNOSIS — N10 Acute tubulo-interstitial nephritis: Secondary | ICD-10-CM | POA: Diagnosis not present

## 2022-07-11 DIAGNOSIS — I5030 Unspecified diastolic (congestive) heart failure: Secondary | ICD-10-CM | POA: Diagnosis not present

## 2022-07-11 DIAGNOSIS — I10 Essential (primary) hypertension: Secondary | ICD-10-CM | POA: Diagnosis not present

## 2022-07-11 DIAGNOSIS — J9690 Respiratory failure, unspecified, unspecified whether with hypoxia or hypercapnia: Secondary | ICD-10-CM | POA: Diagnosis not present

## 2022-07-11 DIAGNOSIS — J449 Chronic obstructive pulmonary disease, unspecified: Secondary | ICD-10-CM | POA: Diagnosis not present

## 2022-07-11 DIAGNOSIS — B3731 Acute candidiasis of vulva and vagina: Secondary | ICD-10-CM | POA: Diagnosis not present

## 2022-07-12 ENCOUNTER — Institutional Professional Consult (permissible substitution): Payer: Medicare Other | Admitting: Internal Medicine

## 2022-07-12 NOTE — Progress Notes (Deleted)
[name], female    DOB: 1951/01/21    MRN: [id number]   Brief patient profile:  ***  71 yo*** *** referred to pulmonary clinic in [place]  07/12/2022 by *** for ***   Admit date: 06/29/2022 Discharge date: 07/07/2022 Admitted From: Home Disposition: SNF Recommendations for Outpatient Follow-up:  Please obtain BMP and CBC with differential in 1 week Outpatient follow-up with pulmonology about left lower lobe nodule Please follow up on the following pending results: None      Hospital course 71 year old F with PMH of COPD/chronic RF on 2 L, diastolic CHF, HTN, chronic back pain, urinary retention and anxiety presenting with progressive confusion and fever, and admitted for severe sepsis and acute metabolic encephalopathy due to acute pyelonephritis and E. coli bacteremia.  Patient was febrile to 101.7 with leukocytosis to 22 and hypotension to 90/41.  She was lethargic and had an AKI with creatinine of 1.3.  She also had elevated troponin to 902 that has trended up to 1114.  EKG without significant acute ischemic finding.  CT head negative.  CT chest abdomen and pelvis with contrast showed 7 mm LLL nodule, left pyelonephritis and minimal fullness of proximal ureter without obstruction.  Cultures obtained.  Foley catheter placed.  Patient was started on IV fluid and broad-spectrum antibiotics and admitted.  Cardiology consulted.    TTE with LVEF of 55 to 60%, G1 DD and moderate TVR.  Nuclear stress test with normal LV perfusion and no evidence of ischemia but poor quality due to patient's movement.    CTA chest PE protocol on 7/8 with increased clustered ill-defined GG nodularity in the superior segment of LLL and scattered tiny centrilobular nodules in both lungs consistent with infectious or inflammatory bronchiolitis and concern for aspiration pneumonia given the small amount of secretion in trachea.  MRI lumbar spine without acute finding or osteomyelitis but multilevel spondylosis and  moderate bilateral neuroforaminal stenosis at L5-S1.   Patient blood culture grew pansensitive E. coli.  Antibiotic de-escalated to IV Ancef.  She is discharged on p.o. cefadroxil for 6 more days to complete treatment course for E. coli bacteremia and pyelonephritis.    Therapy recommended SNF.   See individual problem list below for more.    Problems addressed during this hospitalization     Severe sepsis ([place])   Urinary retention   COPD exacerbation (HCC)   Anxiety   Acute on chronic diastolic CHF (congestive heart failure) (HCC)   Troponin level elevated   Acute pyelonephritis   E coli bacteremia   Acute metabolic encephalopathy   Acute on chronic respiratory failure with hypoxia and hypercapnia (HCC)   Urinary tract infection   Chronic lower back pain   Pulmonary nodule   Obesity (BMI 30-39.9)   Normocytic anemia   Severe sepsis due to acute left pyelonephritis and E. coli bacteremia in the setting of urinary retention, and possible aspiration pneumonia: POA.  Patient had fever, leukocytosis, hypotension, AKI and encephalopathy on presentation.  Blood culture with pansensitive E. coli.  Imaging concerning for left pyelonephritis and left ureteral fullness without obstructing stone.  Imaging also raises concern about left lung pneumonia and possible aspiration. -7/5 Vanco and cefepime>> 7/6 ceftriaxone> 7/10 Ancef> 7/13-7/19 cefadroxil -Doxycycline 7/93-9/03   Acute metabolic encephalopathy: Likely due to the above.  She is also at risk for polypharmacy due to pain medications other sedating medications.  Resolved. -Reorientation and delirium precautions -Minimize sedating medications   Possible aspiration pneumonia: CTA chest negative for PE but  raises concern for LLL pneumonia with concern for aspiration pneumonia given fluid in trachea. -Low risk for aspiration per SLP.   Acute on chronic diastolic CHF: Evidence of fluid overload per exam by previous attending.  Likely in  the setting of fluid resuscitation for sepsis.  TTE with normal LVEF, G1 DD and RVSP of 44.  BNP elevated but improved with diuretics.  Diuresed with IV Lasix.  Net -8.5 L.  Appears euvolemic on exam -P.o. Lasix 20 mg daily -Reassess BMP and fluid status at follow-up.   COPD exacerbation: Resolved. -Continue LABA/LAMA/ICS -As needed albuterol -Minimum oxygen to keep saturation above 90%   Acute on chronic hypoxic and hypercarbic respiratory failure/OSA not on CPAP: On 2 L at baseline.  -Minimum oxygen to keep saturation above 90%.  She is high risk for CO2 retention   Acute kidney injury: Resolved. Recent Labs (within last 365 days)             Recent Labs    06/29/22 2057 06/30/22 0451 07/01/22 0207 07/02/22 0122 07/03/22 0221 07/04/22 0049 07/05/22 0231 07/06/22 0301 07/07/22 0035  BUN 26* 25* 30* '23 20 21 19 15 12  '$ CREATININE 1.29* 1.12* 0.94 0.83 0.67 0.91 0.86 0.79 0.64    -Recheck BMP in 1 week   Atypical chest pain/elevated troponin: Troponin elevated to 900 and peaked at 1114.  Demand ischemia?  TTE as above.  Nuclear stress test negative but poor quality.  Chest pain could be due to pneumonia as well.  Symptoms resolved.  Cardiology signed off.  At this point, we can say non-STEMI ruled out. -Continue aspirin and statin.   Essential hypertension: Normotensive off home Hyzaar. -Continue home Cardizem CD -P.o. Lasix 20 mg daily   Urinary retention: History of this.  She had Foley placed in ED. -Continue Flomax and bethanechol -Passed voiding trial.   Chronic low back pain-lumbar MRI without acute finding but spondylolysis with moderate bilateral neuroforaminal stenosis at L5-S1. -Continue therapy at SNF -Continue home Norco but not a great choice with her respiratory issue.   Normocytic anemia: Stable. -Recheck CBC at follow-up   8 mm ground-glass nodule in the left lower lobe.  PCP to arrange outpatient pulmonary follow-up within 1 to 2 weeks of discharge for  follow-up and monitoring.   Obesity Body mass index is 32.26 kg/m.       History of Present Illness  07/12/2022  Pulmonary/ 1st office eval/ Dalores Weger / [place] [facility]  No chief complaint on file.    Dyspnea:  *** Cough: *** Sleep: *** SABA use:   Past Medical History:  Diagnosis Date   Anxiety    Atrophic vaginitis 10/09/2015   Baker's cyst    left leg   Blood in urine 10/09/2015   CHF (congestive heart failure) (HCC)    COPD (chronic obstructive pulmonary disease) (HCC)    Heart murmur    Hematuria 10/09/2015   Hypertension    Nicotine addiction 04/07/2014   Pneumonia    Urinary urgency 01/25/2016   Vaginal bleeding 10/09/2015    Outpatient Medications Prior to Visit  Medication Sig Dispense Refill   albuterol (PROVENTIL HFA;VENTOLIN HFA) 108 (90 BASE) MCG/ACT inhaler Inhale 2 puffs into the lungs every 6 (six) hours as needed for wheezing or shortness of breath. For shortness of breath     aspirin EC 81 MG tablet Take 81 mg by mouth in the morning. Swallow whole.     atorvastatin (LIPITOR) 80 MG tablet Take 1 tablet (80 mg total)  by mouth daily.     bethanechol (URECHOLINE) 10 MG tablet Take 1 tablet (10 mg total) by mouth 3 (three) times daily.     cefadroxil (DURICEF) 1 g tablet Take 1 tablet (1 g total) by mouth 2 (two) times daily for 6 days. 12 tablet 0   diltiazem (CARDIZEM CD) 180 MG 24 hr capsule Take 1 capsule (180 mg total) by mouth daily.     furosemide (LASIX) 20 MG tablet Take 1 tablet (20 mg total) by mouth daily. 30 tablet    HYDROcodone-acetaminophen (NORCO) 10-325 MG tablet Take 1 tablet by mouth every 6 (six) hours as needed for up to 5 days for moderate pain or severe pain. 20 tablet 0   melatonin 5 MG TABS Take 1 tablet (5 mg total) by mouth once as needed.  0   Multiple Vitamin (MULTIVITAMIN WITH MINERALS) TABS tablet Take 1 tablet by mouth in the morning.     pantoprazole (PROTONIX) 20 MG tablet Take 1 tablet (20 mg total) by mouth daily.      polyethylene glycol (MIRALAX / GLYCOLAX) 17 g packet Take 17 g by mouth daily as needed. Sometimes forgets to take everyday.     senna-docusate (SENOKOT-S) 8.6-50 MG tablet Take 1 tablet by mouth at bedtime.     tamsulosin (FLOMAX) 0.4 MG CAPS capsule Take 1 capsule (0.4 mg total) by mouth daily after supper. 30 capsule    TRELEGY ELLIPTA 100-62.5-25 MCG/INH AEPB Take 1 puff by mouth in the morning.     vitamin B-12 1000 MCG tablet Take 1 tablet (1,000 mcg total) by mouth daily.     No facility-administered medications prior to visit.     Objective:     There were no vitals taken for this visit.         Assessment   No problem-specific Assessment & Plan notes found for this encounter.     Christinia Gully, MD 07/12/2022

## 2022-07-13 DIAGNOSIS — N12 Tubulo-interstitial nephritis, not specified as acute or chronic: Secondary | ICD-10-CM | POA: Diagnosis not present

## 2022-07-13 DIAGNOSIS — G9340 Encephalopathy, unspecified: Secondary | ICD-10-CM | POA: Diagnosis not present

## 2022-07-13 DIAGNOSIS — A419 Sepsis, unspecified organism: Secondary | ICD-10-CM | POA: Diagnosis not present

## 2022-07-13 DIAGNOSIS — N390 Urinary tract infection, site not specified: Secondary | ICD-10-CM | POA: Diagnosis not present

## 2022-07-14 ENCOUNTER — Other Ambulatory Visit: Payer: Self-pay | Admitting: *Deleted

## 2022-07-14 NOTE — Patient Outreach (Addendum)
Per [place] eligible member currently resides in [facility] SNF.  Screening for potential care management/care coordination needs as a benefit of [facility] insurance plan and PCP.   Ms. Pence PCP, Dr. Nevada Crane has [facility] care management services.  Ms. Anwar admitted to SNF on 07/07/22 after hospitalization.  Facility site visit to [facility] skilled nursing facility. Met with Niozjia, SNF SW. Mrs. Jaquith is from home with daughter. The plan is to return. Ms. Stalder was independent prior to admission. No identifiable post SNF care coordination/care management needs.   Spoke with Ms. Ayotte at bedside to encourage to follow up with PCP post SNF. Ms. Josey says "most definitely, that is one of the first calls I will make. I have several doctor appointments to make." Ms. Sevcik confirms she lives with her daughter. Denies any concerns with transportation or meals.   Expressed appreciation of visit. Denies having any care management/care coordination needs. Endorses she has been on oxygen for years and is comfortable with managing her health and medications.   Marthenia Rolling, MSN, RN,BSN [place] Acute Care Coordinator [phone] [facility]) [phone]  (Toll free office)

## 2022-07-19 DIAGNOSIS — I5030 Unspecified diastolic (congestive) heart failure: Secondary | ICD-10-CM | POA: Diagnosis not present

## 2022-07-19 DIAGNOSIS — J449 Chronic obstructive pulmonary disease, unspecified: Secondary | ICD-10-CM | POA: Diagnosis not present

## 2022-07-19 DIAGNOSIS — I10 Essential (primary) hypertension: Secondary | ICD-10-CM | POA: Diagnosis not present

## 2022-07-20 DIAGNOSIS — D649 Anemia, unspecified: Secondary | ICD-10-CM | POA: Diagnosis not present

## 2022-07-20 DIAGNOSIS — J449 Chronic obstructive pulmonary disease, unspecified: Secondary | ICD-10-CM | POA: Diagnosis not present

## 2022-07-20 DIAGNOSIS — E785 Hyperlipidemia, unspecified: Secondary | ICD-10-CM | POA: Diagnosis not present

## 2022-07-20 DIAGNOSIS — I5030 Unspecified diastolic (congestive) heart failure: Secondary | ICD-10-CM | POA: Diagnosis not present

## 2022-07-21 ENCOUNTER — Other Ambulatory Visit: Payer: Self-pay | Admitting: *Deleted

## 2022-07-21 NOTE — Patient Outreach (Signed)
[facility] Post- Acute Care Coordinator follow up.   Met with Niozjia, SNF SW at [facility]. Confirmed Ms. Delage transitioned to home on yesterday, July 26th. Lives with daughter. Will have [facility].  No identifiable care coordination needs.   PCP has [facility] care management services.    Marthenia Rolling, MSN, RN,BSN [place] Acute Care Coordinator [phone] [facility]) [phone]  (Toll free office)

## 2022-07-22 ENCOUNTER — Telehealth: Payer: Self-pay | Admitting: Internal Medicine

## 2022-07-22 DIAGNOSIS — M48061 Spinal stenosis, lumbar region without neurogenic claudication: Secondary | ICD-10-CM | POA: Diagnosis not present

## 2022-07-22 DIAGNOSIS — N390 Urinary tract infection, site not specified: Secondary | ICD-10-CM | POA: Diagnosis not present

## 2022-07-22 DIAGNOSIS — J9621 Acute and chronic respiratory failure with hypoxia: Secondary | ICD-10-CM | POA: Diagnosis not present

## 2022-07-22 DIAGNOSIS — J449 Chronic obstructive pulmonary disease, unspecified: Secondary | ICD-10-CM | POA: Diagnosis not present

## 2022-07-22 DIAGNOSIS — J690 Pneumonitis due to inhalation of food and vomit: Secondary | ICD-10-CM | POA: Diagnosis not present

## 2022-07-22 DIAGNOSIS — R338 Other retention of urine: Secondary | ICD-10-CM | POA: Diagnosis not present

## 2022-07-22 DIAGNOSIS — N179 Acute kidney failure, unspecified: Secondary | ICD-10-CM | POA: Diagnosis not present

## 2022-07-22 DIAGNOSIS — I5033 Acute on chronic diastolic (congestive) heart failure: Secondary | ICD-10-CM | POA: Diagnosis not present

## 2022-07-22 DIAGNOSIS — G9341 Metabolic encephalopathy: Secondary | ICD-10-CM | POA: Diagnosis not present

## 2022-07-22 DIAGNOSIS — R911 Solitary pulmonary nodule: Secondary | ICD-10-CM | POA: Diagnosis not present

## 2022-07-22 DIAGNOSIS — I10 Essential (primary) hypertension: Secondary | ICD-10-CM | POA: Diagnosis not present

## 2022-07-22 NOTE — Telephone Encounter (Signed)
Called and offered Tues. 8/1 appt to patient. She was very grateful and accepted appt.  Aware to bring all meds to appt.  Made aware of recs about ER. Nothing further needed

## 2022-07-22 NOTE — Telephone Encounter (Signed)
Yes Tuesday or Thursday afternoons best   In meantime can adjust 02 flow  to keep sats > 90% and if can't maintain that goal  at rest will need to go to ER   - must bring all active meds/ inhalers/ solutions

## 2022-07-22 NOTE — Telephone Encounter (Signed)
Pt has appt 8/31.  States her oxygen has been dropping with activity.  Lowest was 78 last night.  States her oxygen does come back up.  Wanted to see if she could be worked in with Dr. Melvyn Novas next week.    Dr. Melvyn Novas please advise, can we add this patient on next week?

## 2022-07-26 ENCOUNTER — Encounter: Payer: Self-pay | Admitting: Internal Medicine

## 2022-07-26 ENCOUNTER — Ambulatory Visit: Payer: Medicare Other | Admitting: Internal Medicine

## 2022-07-26 DIAGNOSIS — J9622 Acute and chronic respiratory failure with hypercapnia: Secondary | ICD-10-CM | POA: Diagnosis not present

## 2022-07-26 DIAGNOSIS — J449 Chronic obstructive pulmonary disease, unspecified: Secondary | ICD-10-CM

## 2022-07-26 DIAGNOSIS — J9621 Acute and chronic respiratory failure with hypoxia: Secondary | ICD-10-CM | POA: Diagnosis not present

## 2022-07-26 NOTE — Progress Notes (Unsigned)
[name], female    DOB: 1951/12/13    MRN: [id number]   Brief patient profile:  71   yowf  quit smoking 2014  referred to pulmonary clinic in [place]  07/26/2022 by [place] for copd GOLD 4  / 02 dep  then admit:  Admit date: 06/29/2022 Discharge date: 07/07/2022 Admitted From: Home Disposition: SNF Recommendations for Outpatient Follow-up:  Please obtain BMP and CBC with differential in 1 week Outpatient follow-up with pulmonology about left lower lobe nodule Please follow up on the following pending results: None        Hospital course 71 year old F with PMH of COPD/chronic RF on 2 L, diastolic CHF, HTN, chronic back pain, urinary retention and anxiety presenting with progressive confusion and fever, and admitted for severe sepsis and acute metabolic encephalopathy due to acute pyelonephritis and E. coli bacteremia.  Patient was febrile to 101.7 with leukocytosis to 22 and hypotension to 90/41.  She was lethargic and had an AKI with creatinine of 1.3.  She also had elevated troponin to 902 that has trended up to 1114.  EKG without significant acute ischemic finding.  CT head negative.  CT chest abdomen and pelvis with contrast showed 7 mm LLL nodule, left pyelonephritis and minimal fullness of proximal ureter without obstruction.  Cultures obtained.  Foley catheter placed.  Patient was started on IV fluid and broad-spectrum antibiotics and admitted.  Cardiology consulted.    TTE with LVEF of 55 to 60%, G1 DD and moderate TVR.  Nuclear stress test with normal LV perfusion and no evidence of ischemia but poor quality due to patient's movement.    CTA chest PE protocol on 7/8 with increased clustered ill-defined GG nodularity in the superior segment of LLL and scattered tiny centrilobular nodules in both lungs consistent with infectious or inflammatory bronchiolitis and concern for aspiration pneumonia given the small amount of secretion in trachea.  MRI lumbar spine without acute finding  or osteomyelitis but multilevel spondylosis and moderate bilateral neuroforaminal stenosis at L5-S1.   Patient blood culture grew pansensitive E. coli.  Antibiotic de-escalated to IV Ancef.  She is discharged on p.o. cefadroxil for 6 more days to complete treatment course for E. coli bacteremia and pyelonephritis.    Therapy recommended SNF.   See individual problem list below for more.    Problems addressed during this hospitalization Principal Problem:   Severe sepsis ([place]) Active Problems:   Urinary retention   COPD exacerbation (HCC)   Anxiety   Acute on chronic diastolic CHF (congestive heart failure) (HCC)   Troponin level elevated   Acute pyelonephritis   E coli bacteremia   Acute metabolic encephalopathy   Acute on chronic respiratory failure with hypoxia and hypercapnia (HCC)   Urinary tract infection   Chronic lower back pain   Pulmonary nodule   Obesity (BMI 30-39.9)   Normocytic anemia   Severe sepsis due to acute left pyelonephritis and E. coli bacteremia in the setting of urinary retention, and possible aspiration pneumonia: POA.  Patient had fever, leukocytosis, hypotension, AKI and encephalopathy on presentation.  Blood culture with pansensitive E. coli.  Imaging concerning for left pyelonephritis and left ureteral fullness without obstructing stone.  Imaging also raises concern about left lung pneumonia and possible aspiration. -7/5 Vanco and cefepime>> 7/6 ceftriaxone> 7/10 Ancef> 7/13-7/19 cefadroxil -Doxycycline 4/09-8/11   Acute metabolic encephalopathy: Likely due to the above.  She is also at risk for polypharmacy due to pain medications other sedating medications.  Resolved. -Reorientation  and delirium precautions -Minimize sedating medications   Possible aspiration pneumonia: CTA chest negative for PE but raises concern for LLL pneumonia with concern for aspiration pneumonia given fluid in trachea. -Low risk for aspiration per SLP.   Acute on chronic  diastolic CHF: Evidence of fluid overload per exam by previous attending.  Likely in the setting of fluid resuscitation for sepsis.  TTE with normal LVEF, G1 DD and RVSP of 44.  BNP elevated but improved with diuretics.  Diuresed with IV Lasix.  Net -8.5 L.  Appears euvolemic on exam -P.o. Lasix 20 mg daily -Reassess BMP and fluid status at follow-up.   COPD exacerbation: Resolved. -Continue LABA/LAMA/ICS -As needed albuterol -Minimum oxygen to keep saturation above 90%   Acute on chronic hypoxic and hypercarbic respiratory failure/OSA not on CPAP: On 2 L at baseline.  -Minimum oxygen to keep saturation above 90%.  She is high risk for CO2 retention   Acute kidney injury: Resolved. Recent Labs (within last 365 days)             Recent Labs    06/29/22 2057 06/30/22 0451 07/01/22 0207 07/02/22 0122 07/03/22 0221 07/04/22 0049 07/05/22 0231 07/06/22 0301 07/07/22 0035  BUN 26* 25* 30* '23 20 21 19 15 12  '$ CREATININE 1.29* 1.12* 0.94 0.83 0.67 0.91 0.86 0.79 0.64    -Recheck BMP in 1 week   Atypical chest pain/elevated troponin: Troponin elevated to 900 and peaked at 1114.  Demand ischemia?  TTE as above.  Nuclear stress test negative but poor quality.  Chest pain could be due to pneumonia as well.  Symptoms resolved.  Cardiology signed off.  At this point, we can say non-STEMI ruled out. -Continue aspirin and statin.   Essential hypertension: Normotensive off home Hyzaar. -Continue home Cardizem CD -P.o. Lasix 20 mg daily   Urinary retention: History of this.  She had Foley placed in ED. -Continue Flomax and bethanechol -Passed voiding trial.   Chronic low back pain-lumbar MRI without acute finding but spondylolysis with moderate bilateral neuroforaminal stenosis at L5-S1. -Continue therapy at SNF -Continue home Norco but not a great choice with her respiratory issue.   Normocytic anemia: Stable. -Recheck CBC at follow-up   8 mm ground-glass nodule in the left lower lobe.   PCP to arrange outpatient pulmonary follow-up within 1 to 2 weeks of discharge for follow-up and monitoring.   Obesity Body mass index is 32.26 kg/m.             History of Present Illness  07/26/2022  Pulmonary/ 1st office eval/ [facility] / [facility] [place] [facility] home from NH x one week p above admit  Chief Complaint  Patient presents with   Consult    Consult for O2 dropping normally uses 2LO2 cont.   Dyspnea:  indoor walking room to room  Cough: no am flares/ sporadic daytime min mucoid production Sleep: on side bed is flat 2 pillows  SABA use: hfa 3-4 x daily  02:  2lpm hs and does not titrate daytime   No obvious day to day or daytime pattern/variability or assoc excess/ purulent sputum or mucus plugs or hemoptysis or cp or chest tightness, subjective wheeze or overt sinus or hb symptoms.   Slee;ing as above without nocturnal  or early am exacerbation  of respiratory  c/o's or need for noct saba. Also denies any obvious fluctuation of symptoms with weather or environmental changes or other aggravating or alleviating factors except as outlined above   No unusual exposure  hx or h/o childhood pna/ asthma or knowledge of premature birth.  Current Allergies, Complete Past Medical History, Past Surgical History, Family History, and Social History were reviewed in [employer] record.  ROS  The following are not active complaints unless bolded Hoarseness, sore throat, dysphagia, dental problems, itching, sneezing,  nasal congestion or discharge of excess mucus or purulent secretions, ear ache,   fever, chills, sweats, unintended wt loss or wt gain, classically pleuritic or exertional cp,  orthopnea pnd or arm/hand swelling  or leg swelling, presyncope, palpitations, abdominal pain, anorexia, nausea, vomiting, diarrhea  or change in bowel habits or change in bladder habits, change in stools or change in urine, dysuria, hematuria,  rash, arthralgias, visual complaints,  headache, numbness, weakness or ataxia or problems with walking or coordination,  change in mood or  memory.           Past Medical History:  Diagnosis Date   Anxiety    Atrophic vaginitis 10/09/2015   Baker's cyst    left leg   Blood in urine 10/09/2015   CHF (congestive heart failure) (HCC)    COPD (chronic obstructive pulmonary disease) (HCC)    Heart murmur    Hematuria 10/09/2015   Hypertension    Nicotine addiction 04/07/2014   Pneumonia    Urinary urgency 01/25/2016   Vaginal bleeding 10/09/2015    Outpatient Medications Prior to Visit  Medication Sig Dispense Refill   albuterol (PROVENTIL HFA;VENTOLIN HFA) 108 (90 BASE) MCG/ACT inhaler Inhale 2 puffs into the lungs every 6 (six) hours as needed for wheezing or shortness of breath. For shortness of breath     aspirin EC 81 MG tablet Take 81 mg by mouth in the morning. Swallow whole.     atorvastatin (LIPITOR) 80 MG tablet Take 1 tablet (80 mg total) by mouth daily.     bethanechol (URECHOLINE) 10 MG tablet Take 1 tablet (10 mg total) by mouth 3 (three) times daily.     diltiazem (CARDIZEM CD) 180 MG 24 hr capsule Take 1 capsule (180 mg total) by mouth daily.     furosemide (LASIX) 20 MG tablet Take 1 tablet (20 mg total) by mouth daily. 30 tablet    melatonin 5 MG TABS Take 1 tablet (5 mg total) by mouth once as needed.  0   Multiple Vitamin (MULTIVITAMIN WITH MINERALS) TABS tablet Take 1 tablet by mouth in the morning.     pantoprazole (PROTONIX) 20 MG tablet Take 1 tablet (20 mg total) by mouth daily.     polyethylene glycol (MIRALAX / GLYCOLAX) 17 g packet Take 17 g by mouth daily as needed. Sometimes forgets to take everyday.     senna-docusate (SENOKOT-S) 8.6-50 MG tablet Take 1 tablet by mouth at bedtime.     tamsulosin (FLOMAX) 0.4 MG CAPS capsule Take 1 capsule (0.4 mg total) by mouth daily after supper. 30 capsule    TRELEGY ELLIPTA 100-62.5-25 MCG/INH AEPB Take 1 puff by mouth in the morning.     vitamin B-12 1000  MCG tablet Take 1 tablet (1,000 mcg total) by mouth daily.     Cyanocobalamin (VITAMIN B-12 CR) 1000 MCG TBCR Take 1 tablet by mouth daily.     HYDROcodone-acetaminophen (NORCO) 10-325 MG tablet Take 1 tablet by mouth 4 (four) times daily as needed.     losartan (COZAAR) 50 MG tablet Take 50 mg by mouth daily.     No facility-administered medications prior to visit.     Objective:  BP 134/80 (BP Location: Left Arm, Patient Position: Sitting)   Pulse 80   Temp 98 F (36.7 C) (Temporal)   Ht '5\' 3"'$  (1.6 m)   Wt 170 lb 3.2 oz (77.2 kg)   SpO2 96% Comment: 3LO2 cont  BMI 30.15 kg/m   SpO2: 96 % (3LO2 cont)  Amb wf chronically ill appearing   HEENT :  Oropharynx  clear   Nasal turbinates nl    NECK :  without JVD/Nodes/TM/ nl carotid upstrokes bilaterally   LUNGS: no acc muscle use,  Mod barrel  contour chest wall with bilateral  Distant bs s audible wheeze and  without cough on insp or exp maneuvers and mod  Hyperresonant  to  percussion bilaterally     CV:  RRR  no s3 or murmur or increase in P2, and trace pitting edema  both feet   ABD:  soft and nontender with pos mid insp Hoover's  in the supine position. No bruits or organomegaly appreciated, bowel sounds nl  MS:   Ext warm without deformities or   obvious joint restrictions , calf tenderness, cyanosis or clubbing  SKIN: warm and dry without lesions    NEURO:  alert, approp, nl sensorium with  no motor or cerebellar deficits apparent.          I personally reviewed images and agree with radiology impression as follows:  CXR:   portable 07/05/22 1. No definite radiographic evidence of acute cardiopulmonary disease. 2. Mild cardiomegaly. 3. Extensive chronic scarring in the right mid to lower lung, similar to prior studies     Assessment   COPD GOLD 4  Quit smoking 2014  - PFT's  08/19/16  FEV1 0.54 (22 % ) ratio 0.43  p 1 % improvement from saba p Incruse prior to study with DLCO  10.53  (49%) and  FV curve  classically concave     Group D (now reclassified as E) in terms of symptom/risk and laba/lama/ICS  therefore appropriate rx at this point >>>  trelegy and approp saba:  Re SABA :  I spent extra time with pt today reviewing appropriate use of albuterol for prn use on exertion with the following points: 1) saba is for relief of sob that does not improve by walking a slower pace or resting but rather if the pt does not improve after trying this first. 2) If the pt is convinced, as many are, that saba helps recover from activity faster then it's easy to tell if this is the case by re-challenging : ie stop, take the inhaler, then p 5 minutes try the exact same activity (intensity of workload) that just caused the symptoms and see if they are substantially diminished or not after saba 3) if there is an activity that reproducibly causes the symptoms, try the saba 15 min before the activity on alternate days   If in fact the saba really does help, then fine to continue to use it prn but advised may need to look closer at the maintenance regimen being used to achieve better control of airways disease with exertion.   - The proper method of use, as well as anticipated side effects, of a metered-dose inhaler were discussed and demonstrated to the patient using teach back method.     Acute on chronic respiratory failure with hypoxia and hypercapnia (HCC) HC03 baseline around 44 as of admit 06/2022  - 07/26/2022 patient walked at a slow pace on 3LO2 cont patient had to stop after  150 ft  due to SOB and being tired. Walked patient to her car after visit and on 3LO2 pulse, her O2 was at 67% on personal pulse ox but ours was low 90s   She probably just needs new pulse ox and reminded will need to titrate: Make sure you check your oxygen saturation  AT  your highest level of activity (not after you stop)   to be sure it stays around  90% and adjust  02 flow upward to maintain this level if needed but remember to  turn it back to previous settings when you stop (to conserve your supply).    Each maintenance medication was reviewed in detail including emphasizing most importantly the difference between maintenance and prns and under what circumstances the prns are to be triggered using an action plan format where appropriate.  Total time for H and P, chart review, counseling, reviewing dpi/02/pulse ox device(s) , directly observing portions of ambulatory 02 saturation study/ and generating customized AVS unique to this office visit / same day charting = 32 min post hosp f/u ov with pt new to me                   Christinia Gully, MD 07/26/2022

## 2022-07-26 NOTE — Patient Instructions (Addendum)
Continue trelegy one click each am  - take two good drags then rinse gargle   Only use your albuterol as a rescue medication to be used if you can't catch your breath by resting or doing a relaxed purse lip breathing pattern.  - The less you use it, the better it will work when you need it. - Ok to use up to 2 puffs  every 4 hours if you must but call for immediate appointment if use goes up over your usual need - Don't leave home without it !!  (think of it like the spare tire for your car)   Ok to try albuterol 15 min before an activity (on alternating days)  that you know would usually make you short of breath and see if it makes any difference and if makes none then don't take albuterol after activity unless you can't catch your breath as this means it's the resting that helps, not the albuterol.      Make sure you check your oxygen saturation  AT  your highest level of activity (not after you stop)   to be sure it stays over 90% and adjust  02 flow upward to maintain this level if needed but remember to turn it back to previous settings when you stop (to conserve your supply).    Please schedule a follow up office visit in 6 weeks, sooner if needed with cxr on return

## 2022-07-27 ENCOUNTER — Encounter: Payer: Self-pay | Admitting: Internal Medicine

## 2022-07-27 DIAGNOSIS — J449 Chronic obstructive pulmonary disease, unspecified: Secondary | ICD-10-CM | POA: Insufficient documentation

## 2022-07-27 NOTE — Assessment & Plan Note (Addendum)
HC03 baseline around 44 as of admit 06/2022  - 07/26/2022 patient walked at a slow pace on 3LO2 cont patient had to stop after 150 ft  due to SOB and being tired. Walked patient to her car after visit and on 3LO2 pulse, her O2 was at 67% on personal pulse ox but ours was low 90s   She probably just needs new pulse ox and reminded will need to titrate: Make sure you check your oxygen saturation  AT  your highest level of activity (not after you stop)   to be sure it stays around  90% and adjust  02 flow upward to maintain this level if needed but remember to turn it back to previous settings when you stop (to conserve your supply).    Each maintenance medication was reviewed in detail including emphasizing most importantly the difference between maintenance and prns and under what circumstances the prns are to be triggered using an action plan format where appropriate.  Total time for H and P, chart review, counseling, reviewing dpi/02/pulse ox device(s) , directly observing portions of ambulatory 02 saturation study/ and generating customized AVS unique to this office visit / same day charting = 32 min post hosp f/u ov with pt new to me

## 2022-07-27 NOTE — Assessment & Plan Note (Signed)
Quit smoking 2014  - PFT's  08/19/16  FEV1 0.54 (22 % ) ratio 0.43  p 1 % improvement from saba p Incruse prior to study with DLCO  10.53  (49%) and  FV curve classically concave     Group D (now reclassified as E) in terms of symptom/risk and laba/lama/ICS  therefore appropriate rx at this point >>>  trelegy and approp saba:  Re SABA :  I spent extra time with pt today reviewing appropriate use of albuterol for prn use on exertion with the following points: 1) saba is for relief of sob that does not improve by walking a slower pace or resting but rather if the pt does not improve after trying this first. 2) If the pt is convinced, as many are, that saba helps recover from activity faster then it's easy to tell if this is the case by re-challenging : ie stop, take the inhaler, then p 5 minutes try the exact same activity (intensity of workload) that just caused the symptoms and see if they are substantially diminished or not after saba 3) if there is an activity that reproducibly causes the symptoms, try the saba 15 min before the activity on alternate days   If in fact the saba really does help, then fine to continue to use it prn but advised may need to look closer at the maintenance regimen being used to achieve better control of airways disease with exertion.   - The proper method of use, as well as anticipated side effects, of a metered-dose inhaler were discussed and demonstrated to the patient using teach back method.

## 2022-07-30 DIAGNOSIS — M545 Low back pain, unspecified: Secondary | ICD-10-CM | POA: Diagnosis not present

## 2022-07-30 DIAGNOSIS — Z7982 Long term (current) use of aspirin: Secondary | ICD-10-CM | POA: Diagnosis not present

## 2022-07-30 DIAGNOSIS — Z9981 Dependence on supplemental oxygen: Secondary | ICD-10-CM | POA: Diagnosis not present

## 2022-07-30 DIAGNOSIS — I110 Hypertensive heart disease with heart failure: Secondary | ICD-10-CM | POA: Diagnosis not present

## 2022-07-30 DIAGNOSIS — D649 Anemia, unspecified: Secondary | ICD-10-CM | POA: Diagnosis not present

## 2022-07-30 DIAGNOSIS — J449 Chronic obstructive pulmonary disease, unspecified: Secondary | ICD-10-CM | POA: Diagnosis not present

## 2022-07-30 DIAGNOSIS — I5032 Chronic diastolic (congestive) heart failure: Secondary | ICD-10-CM | POA: Diagnosis not present

## 2022-07-30 DIAGNOSIS — Z7951 Long term (current) use of inhaled steroids: Secondary | ICD-10-CM | POA: Diagnosis not present

## 2022-07-30 DIAGNOSIS — R911 Solitary pulmonary nodule: Secondary | ICD-10-CM | POA: Diagnosis not present

## 2022-07-30 DIAGNOSIS — Z9181 History of falling: Secondary | ICD-10-CM | POA: Diagnosis not present

## 2022-07-30 DIAGNOSIS — G8929 Other chronic pain: Secondary | ICD-10-CM | POA: Diagnosis not present

## 2022-07-30 DIAGNOSIS — K219 Gastro-esophageal reflux disease without esophagitis: Secondary | ICD-10-CM | POA: Diagnosis not present

## 2022-07-30 DIAGNOSIS — J9611 Chronic respiratory failure with hypoxia: Secondary | ICD-10-CM | POA: Diagnosis not present

## 2022-07-30 DIAGNOSIS — N139 Obstructive and reflux uropathy, unspecified: Secondary | ICD-10-CM | POA: Diagnosis not present

## 2022-07-30 DIAGNOSIS — M6389 Disorders of muscle in diseases classified elsewhere, multiple sites: Secondary | ICD-10-CM | POA: Diagnosis not present

## 2022-07-30 DIAGNOSIS — R778 Other specified abnormalities of plasma proteins: Secondary | ICD-10-CM | POA: Diagnosis not present

## 2022-08-03 DIAGNOSIS — I110 Hypertensive heart disease with heart failure: Secondary | ICD-10-CM | POA: Diagnosis not present

## 2022-08-03 DIAGNOSIS — G8929 Other chronic pain: Secondary | ICD-10-CM | POA: Diagnosis not present

## 2022-08-03 DIAGNOSIS — J9611 Chronic respiratory failure with hypoxia: Secondary | ICD-10-CM | POA: Diagnosis not present

## 2022-08-03 DIAGNOSIS — Z7982 Long term (current) use of aspirin: Secondary | ICD-10-CM | POA: Diagnosis not present

## 2022-08-03 DIAGNOSIS — I5032 Chronic diastolic (congestive) heart failure: Secondary | ICD-10-CM | POA: Diagnosis not present

## 2022-08-03 DIAGNOSIS — K219 Gastro-esophageal reflux disease without esophagitis: Secondary | ICD-10-CM | POA: Diagnosis not present

## 2022-08-03 DIAGNOSIS — J449 Chronic obstructive pulmonary disease, unspecified: Secondary | ICD-10-CM | POA: Diagnosis not present

## 2022-08-03 DIAGNOSIS — Z9981 Dependence on supplemental oxygen: Secondary | ICD-10-CM | POA: Diagnosis not present

## 2022-08-03 DIAGNOSIS — M545 Low back pain, unspecified: Secondary | ICD-10-CM | POA: Diagnosis not present

## 2022-08-03 DIAGNOSIS — N139 Obstructive and reflux uropathy, unspecified: Secondary | ICD-10-CM | POA: Diagnosis not present

## 2022-08-03 DIAGNOSIS — D649 Anemia, unspecified: Secondary | ICD-10-CM | POA: Diagnosis not present

## 2022-08-03 DIAGNOSIS — M6389 Disorders of muscle in diseases classified elsewhere, multiple sites: Secondary | ICD-10-CM | POA: Diagnosis not present

## 2022-08-03 DIAGNOSIS — Z9181 History of falling: Secondary | ICD-10-CM | POA: Diagnosis not present

## 2022-08-03 DIAGNOSIS — R911 Solitary pulmonary nodule: Secondary | ICD-10-CM | POA: Diagnosis not present

## 2022-08-03 DIAGNOSIS — R778 Other specified abnormalities of plasma proteins: Secondary | ICD-10-CM | POA: Diagnosis not present

## 2022-08-03 DIAGNOSIS — Z7951 Long term (current) use of inhaled steroids: Secondary | ICD-10-CM | POA: Diagnosis not present

## 2022-08-04 DIAGNOSIS — M6389 Disorders of muscle in diseases classified elsewhere, multiple sites: Secondary | ICD-10-CM | POA: Diagnosis not present

## 2022-08-04 DIAGNOSIS — N139 Obstructive and reflux uropathy, unspecified: Secondary | ICD-10-CM | POA: Diagnosis not present

## 2022-08-04 DIAGNOSIS — J449 Chronic obstructive pulmonary disease, unspecified: Secondary | ICD-10-CM | POA: Diagnosis not present

## 2022-08-04 DIAGNOSIS — R911 Solitary pulmonary nodule: Secondary | ICD-10-CM | POA: Diagnosis not present

## 2022-08-04 DIAGNOSIS — Z7951 Long term (current) use of inhaled steroids: Secondary | ICD-10-CM | POA: Diagnosis not present

## 2022-08-04 DIAGNOSIS — Z9181 History of falling: Secondary | ICD-10-CM | POA: Diagnosis not present

## 2022-08-04 DIAGNOSIS — J9611 Chronic respiratory failure with hypoxia: Secondary | ICD-10-CM | POA: Diagnosis not present

## 2022-08-04 DIAGNOSIS — Z7982 Long term (current) use of aspirin: Secondary | ICD-10-CM | POA: Diagnosis not present

## 2022-08-04 DIAGNOSIS — D649 Anemia, unspecified: Secondary | ICD-10-CM | POA: Diagnosis not present

## 2022-08-04 DIAGNOSIS — Z9981 Dependence on supplemental oxygen: Secondary | ICD-10-CM | POA: Diagnosis not present

## 2022-08-04 DIAGNOSIS — K219 Gastro-esophageal reflux disease without esophagitis: Secondary | ICD-10-CM | POA: Diagnosis not present

## 2022-08-04 DIAGNOSIS — M545 Low back pain, unspecified: Secondary | ICD-10-CM | POA: Diagnosis not present

## 2022-08-04 DIAGNOSIS — R778 Other specified abnormalities of plasma proteins: Secondary | ICD-10-CM | POA: Diagnosis not present

## 2022-08-04 DIAGNOSIS — I5032 Chronic diastolic (congestive) heart failure: Secondary | ICD-10-CM | POA: Diagnosis not present

## 2022-08-04 DIAGNOSIS — G8929 Other chronic pain: Secondary | ICD-10-CM | POA: Diagnosis not present

## 2022-08-04 DIAGNOSIS — I110 Hypertensive heart disease with heart failure: Secondary | ICD-10-CM | POA: Diagnosis not present

## 2022-08-08 ENCOUNTER — Encounter: Payer: Self-pay | Admitting: Urology

## 2022-08-08 ENCOUNTER — Ambulatory Visit (INDEPENDENT_AMBULATORY_CARE_PROVIDER_SITE_OTHER): Payer: Medicare Other | Admitting: Urology

## 2022-08-08 VITALS — BP 143/78 | HR 94 | Ht 63.0 in | Wt 170.0 lb

## 2022-08-08 DIAGNOSIS — R339 Retention of urine, unspecified: Secondary | ICD-10-CM | POA: Diagnosis not present

## 2022-08-08 DIAGNOSIS — N390 Urinary tract infection, site not specified: Secondary | ICD-10-CM | POA: Diagnosis not present

## 2022-08-08 LAB — BLADDER SCAN AMB NON-IMAGING: Scan Result: 92

## 2022-08-08 MED ORDER — BETHANECHOL CHLORIDE 10 MG PO TABS: 10.0000 mg | ORAL_TABLET | Freq: Two times a day (BID) | ORAL | 11 refills | Status: DC

## 2022-08-08 MED ORDER — TAMSULOSIN HCL 0.4 MG PO CAPS: 0.4000 mg | ORAL_CAPSULE | Freq: Every day | ORAL | 3 refills | Status: DC

## 2022-08-08 NOTE — Progress Notes (Signed)
post void residual =75m

## 2022-08-08 NOTE — Patient Instructions (Signed)
Urinary Tract Infection, Adult  A urinary tract infection (UTI) is an infection of any part of the urinary tract. The urinary tract includes the kidneys, ureters, bladder, and urethra. These organs make, store, and get rid of urine in the body. An upper UTI affects the ureters and kidneys. A lower UTI affects the bladder and urethra. What are the causes? Most urinary tract infections are caused by bacteria in your genital area around your urethra, where urine leaves your body. These bacteria grow and cause inflammation of your urinary tract. What increases the risk? You are more likely to develop this condition if: You have a urinary catheter that stays in place. You are not able to control when you urinate or have a bowel movement (incontinence). You are female and you: Use a spermicide or diaphragm for birth control. Have low estrogen levels. Are pregnant. You have certain genes that increase your risk. You are sexually active. You take antibiotic medicines. You have a condition that causes your flow of urine to slow down, such as: An enlarged prostate, if you are female. Blockage in your urethra. A kidney stone. A nerve condition that affects your bladder control (neurogenic bladder). Not getting enough to drink, or not urinating often. You have certain medical conditions, such as: Diabetes. A weak disease-fighting system (immunesystem). Sickle cell disease. Gout. Spinal cord injury. What are the signs or symptoms? Symptoms of this condition include: Needing to urinate right away (urgency). Frequent urination. This may include small amounts of urine each time you urinate. Pain or burning with urination. Blood in the urine. Urine that smells bad or unusual. Trouble urinating. Cloudy urine. Vaginal discharge, if you are female. Pain in the abdomen or the lower back. You may also have: Vomiting or a decreased appetite. Confusion. Irritability or tiredness. A fever or  chills. Diarrhea. The first symptom in older adults may be confusion. In some cases, they may not have any symptoms until the infection has worsened. How is this diagnosed? This condition is diagnosed based on your medical history and a physical exam. You may also have other tests, including: Urine tests. Blood tests. Tests for STIs (sexually transmitted infections). If you have had more than one UTI, a cystoscopy or imaging studies may be done to determine the cause of the infections. How is this treated? Treatment for this condition includes: Antibiotic medicine. Over-the-counter medicines to treat discomfort. Drinking enough water to stay hydrated. If you have frequent infections or have other conditions such as a kidney stone, you may need to see a health care provider who specializes in the urinary tract (urologist). In rare cases, urinary tract infections can cause sepsis. Sepsis is a life-threatening condition that occurs when the body responds to an infection. Sepsis is treated in the hospital with IV antibiotics, fluids, and other medicines. Follow these instructions at home:  Medicines Take over-the-counter and prescription medicines only as told by your health care provider. If you were prescribed an antibiotic medicine, take it as told by your health care provider. Do not stop using the antibiotic even if you start to feel better. General instructions Make sure you: Empty your bladder often and completely. Do not hold urine for long periods of time. Empty your bladder after sex. Wipe from front to back after urinating or having a bowel movement if you are female. Use each tissue only one time when you wipe. Drink enough fluid to keep your urine pale yellow. Keep all follow-up visits. This is important. Contact a health   care provider if: Your symptoms do not get better after 1-2 days. Your symptoms go away and then return. Get help right away if: You have severe pain in  your back or your lower abdomen. You have a fever or chills. You have nausea or vomiting. Summary A urinary tract infection (UTI) is an infection of any part of the urinary tract, which includes the kidneys, ureters, bladder, and urethra. Most urinary tract infections are caused by bacteria in your genital area. Treatment for this condition often includes antibiotic medicines. If you were prescribed an antibiotic medicine, take it as told by your health care provider. Do not stop using the antibiotic even if you start to feel better. Keep all follow-up visits. This is important. This information is not intended to replace advice given to you by your health care provider. Make sure you discuss any questions you have with your health care provider. Document Revised: 07/24/2020 Document Reviewed: 07/24/2020 Elsevier Patient Education  2023 [employer]  

## 2022-08-08 NOTE — Progress Notes (Signed)
08/08/2022 9:03 AM   [name] 1951-11-06 [id number]  Referring provider: Celene Squibb, MD [address] [place],  [place] [address]  Recent UTI   HPI: [name] is a [phone] here for evaluation of UTI. She was admitted 06/29/2022 with UTI and sepsis. She has a feeling of incomplete emptying. PVR 92cc. For the past 2 days she has noted worsening urinary urgency, frequency, and occasional dysuria. No other associated symptoms.    PMH: Past Medical History:  Diagnosis Date   Anxiety    Atrophic vaginitis 10/09/2015   Baker's cyst    left leg   Blood in urine 10/09/2015   CHF (congestive heart failure) (HCC)    COPD (chronic obstructive pulmonary disease) (HCC)    Heart murmur    Hematuria 10/09/2015   Hypertension    Nicotine addiction 04/07/2014   Pneumonia    Urinary urgency 01/25/2016   Vaginal bleeding 10/09/2015    Surgical History: Past Surgical History:  Procedure Laterality Date   ABDOMINAL HYSTERECTOMY     CATARACT EXTRACTION Left 03/2015   CESAREAN SECTION     COLONOSCOPY  10/19/2004   Dr. Gala Romney- internal hemorrhoids, o/w normal rectum, normal colon   COLONOSCOPY N/A 07/30/2014   TDD:UKGURKY diverticulosis. Colonic polyp-removed TA. repeat TCS 07/2021   COLONOSCOPY WITH PROPOFOL N/A 03/29/2021   diverticulosis in sigmoid and descending colon, one 5 mm polyp ascending colon (adenoma), non-bleeding internal hemorrhoids. 5 year surveillance   ESOPHAGOGASTRODUODENOSCOPY N/A 07/30/2014   HCW:CBJSEG EGD   POLYPECTOMY  03/29/2021   Procedure: POLYPECTOMY;  Surgeon: Daneil Dolin, MD;  [facility];  Service: Endoscopy;;   RADIAL HEAD ARTHROPLASTY Left 06/23/2017   Procedure: RADIAL HEAD ARTHROPLASTY WITH LIGAMENT REPAIR;  Surgeon: Meredith Pel, MD;  Location: [facility];  Service: Orthopedics;  Laterality: Left;    Home Medications:  Allergies as of 08/08/2022   No Known Allergies      Medication List        Accurate as of August 08, 2022  9:03  AM. If you have any questions, ask your nurse or doctor.          albuterol 108 (90 Base) MCG/ACT inhaler Commonly known as: VENTOLIN HFA Inhale 2 puffs into the lungs every 6 (six) hours as needed for wheezing or shortness of breath. For shortness of breath   aspirin EC 81 MG tablet Take 81 mg by mouth in the morning. Swallow whole.   atorvastatin 80 MG tablet Commonly known as: LIPITOR Take 1 tablet (80 mg total) by mouth daily.   bethanechol 10 MG tablet Commonly known as: URECHOLINE Take 1 tablet (10 mg total) by mouth in the morning and at bedtime. What changed: when to take this Changed by: Nicolette Bang, MD   cyanocobalamin 1000 MCG tablet Take 1 tablet (1,000 mcg total) by mouth daily.   Vitamin B-12 CR 1000 MCG Tbcr Take 1 tablet by mouth daily.   diltiazem 180 MG 24 hr capsule Commonly known as: CARDIZEM CD Take 1 capsule (180 mg total) by mouth daily.   furosemide 20 MG tablet Commonly known as: LASIX Take 1 tablet (20 mg total) by mouth daily.   HYDROcodone-acetaminophen 10-325 MG tablet Commonly known as: NORCO Take 1 tablet by mouth 4 (four) times daily as needed.   losartan 50 MG tablet Commonly known as: COZAAR Take 50 mg by mouth daily.   melatonin 5 MG Tabs Take 1 tablet (5 mg total) by mouth once as  needed.   multivitamin with minerals Tabs tablet Take 1 tablet by mouth in the morning.   pantoprazole 20 MG tablet Commonly known as: PROTONIX Take 1 tablet (20 mg total) by mouth daily.   polyethylene glycol 17 g packet Commonly known as: MIRALAX / GLYCOLAX Take 17 g by mouth daily as needed. Sometimes forgets to take everyday.   senna-docusate 8.6-50 MG tablet Commonly known as: Senokot-S Take 1 tablet by mouth at bedtime.   tamsulosin 0.4 MG Caps capsule Commonly known as: FLOMAX Take 1 capsule (0.4 mg total) by mouth daily after supper.   Trelegy Ellipta 100-62.5-25 MCG/ACT Aepb Generic drug: Fluticasone-Umeclidin-Vilant Take  1 puff by mouth in the morning.        Allergies: No Known Allergies  Family History: Family History  Adopted: Yes  Problem Relation Age of Onset   Cancer Sister        pancreatic   Migraines Daughter    Cancer Daughter        pre cancerous cells on cervix   Cancer Sister        breast cancer, colon   Blindness Maternal Grandfather    Other Brother        murdered   Other Sister        ruptured colon   Cancer Sister        breast, skin   Other Brother        was in MVA   Cancer Sister        pancreatic   Hypertension Sister    Other Sister        ruptured colon   Colon cancer Neg Hx     Social History:  reports that she quit smoking about 9 years ago. Her smoking use included cigarettes. She has never used smokeless tobacco. She reports current alcohol use. She reports that she does not use drugs.  ROS: All other review of systems were reviewed and are negative except what is noted above in HPI  Physical Exam: BP (!) 143/78   Pulse 94   Ht '5\' 3"'$  (1.6 m)   Wt 170 lb (77.1 kg)   BMI 30.11 kg/m   Constitutional:  Alert and oriented, No acute distress. HEENT: [place] AT, moist mucus membranes.  Trachea midline, no masses. Cardiovascular: No clubbing, cyanosis, or edema. Respiratory: Normal respiratory effort, no increased work of breathing. GI: Abdomen is soft, nontender, nondistended, no abdominal masses GU: No CVA tenderness.  Lymph: No cervical or inguinal lymphadenopathy. Skin: No rashes, bruises or suspicious lesions. Neurologic: Grossly intact, no focal deficits, moving all 4 extremities. Psychiatric: Normal mood and affect.  Laboratory Data: Lab Results  Component Value Date   WBC 13.1 (H) 07/07/2022   HGB 9.7 (L) 07/07/2022   HCT 31.1 (L) 07/07/2022   MCV 94.2 07/07/2022   PLT 214 07/07/2022    Lab Results  Component Value Date   CREATININE 0.64 07/07/2022    No results found for: "PSA"  No results found for: "TESTOSTERONE"  Lab Results   Component Value Date   HGBA1C 5.4 07/01/2022    Urinalysis    Component Value Date/Time   COLORURINE YELLOW 06/30/2022 0813   APPEARANCEUR HAZY (A) 06/30/2022 0813   APPEARANCEUR Clear 03/02/2021 1629   LABSPEC 1.023 06/30/2022 0813   PHURINE 6.0 06/30/2022 0813   GLUCOSEU NEGATIVE 06/30/2022 0813   HGBUR MODERATE (A) 06/30/2022 0813   BILIRUBINUR NEGATIVE 06/30/2022 0813   BILIRUBINUR Negative 03/02/2021 1629   KETONESUR NEGATIVE 06/30/2022  0813   PROTEINUR NEGATIVE 06/30/2022 0813   UROBILINOGEN 0.2 08/17/2011 1302   NITRITE NEGATIVE 06/30/2022 0813   LEUKOCYTESUR MODERATE (A) 06/30/2022 0813    Lab Results  Component Value Date   LABMICR Comment 03/02/2021   WBCUA 0-5 12/11/2020   RBCUA 0-2 01/25/2016   LABEPIT 0-10 12/11/2020   MUCUS Present 01/25/2016   BACTERIA RARE (A) 06/30/2022    Pertinent Imaging:  Results for orders placed during the hospital encounter of 06/29/22  DG Abd 1 View  Narrative CLINICAL DATA:  Abdominal pain.  EXAM: ABDOMEN - 1 VIEW  COMPARISON:  CT, 06/30/2022.  FINDINGS: Normal bowel gas pattern.  No evidence of renal or ureteral stones.  No acute skeletal abnormality.  IMPRESSION: No acute findings.   Electronically Signed By: Lajean Manes M.D. On: 07/03/2022 10:08  No results found for this or any previous visit.  No results found for this or any previous visit.  No results found for this or any previous visit.  No results found for this or any previous visit.  No results found for this or any previous visit.  No results found for this or any previous visit.  Results for orders placed during the hospital encounter of 06/24/20  CT RENAL STONE STUDY  Narrative CLINICAL DATA:  Urinary retention and bilateral flank pain.  EXAM: CT ABDOMEN AND PELVIS WITHOUT CONTRAST  TECHNIQUE: Multidetector CT imaging of the abdomen and pelvis was performed following the standard protocol without IV  contrast.  COMPARISON:  CT scan and 03/20/2013  FINDINGS: Lower chest: Stable bibasilar scarring changes and evidence of prior right lower lobe lung surgery. The heart is normal in size. No pericardial effusion. Stable aortic calcifications.  Hepatobiliary: No hepatic lesions are identified without contrast. The gallbladder is grossly normal by CT. No common bile duct dilatation.  Pancreas: No mass, inflammation or ductal dilatation.  Spleen: Normal size. No focal lesions.  Adrenals/Urinary Tract: Stable left adrenal gland nodule measuring -3 Hounsfield units and consistent with benign adenoma. The right adrenal gland is normal.  No renal, ureteral or bladder calculi are identified. No worrisome renal lesions without contrast. The bladder is mildly distended but no bladder wall thickening, mass or calculi.  Stomach/Bowel: The stomach, duodenum, small bowel and colon are grossly normal without oral contrast. No inflammatory changes, mass lesions or obstructive findings. The appendix is normal.  Vascular/Lymphatic: Moderate to advanced atherosclerotic calcifications involving the aorta and branch vessels but no aneurysm. No mesenteric or retroperitoneal mass or adenopathy.  Reproductive: Surgically absent.  Other: No pelvic mass or adenopathy. No free pelvic fluid collections. No inguinal mass or adenopathy. No abdominal wall hernia or subcutaneous lesions.  Musculoskeletal: No significant bony findings. Moderate degenerative changes involving the lumbar spine.  IMPRESSION: 1. No acute abdominal/pelvic findings, mass lesions or adenopathy. 2. No renal, ureteral or bladder calculi or mass. 3. Stable left adrenal gland adenoma. 4. Aortic atherosclerosis.  Aortic Atherosclerosis (ICD10-I70.0).   Electronically Signed By: Marijo Sanes M.D. On: 06/25/2020 06:19   Assessment & Plan:    1. Urinary tract infection without hematuria, site unspecified Urine for  culture - Urinalysis, Routine w reflex microscopic  2. Incomplete bladder emptying -continue flomax 0.'4mg'$ , decrease bethenacol to '10mg'$  BID - BLADDER SCAN AMB NON-IMAGING   Return in about 1 year (around 08/09/2023) for PVR.  Nicolette Bang, MD  [facility] [place]

## 2022-08-09 LAB — MICROSCOPIC EXAMINATION
Bacteria, UA: NONE SEEN
RBC, Urine: NONE SEEN /hpf (ref 0–2)

## 2022-08-09 LAB — URINALYSIS, ROUTINE W REFLEX MICROSCOPIC
Bilirubin, UA: NEGATIVE
Glucose, UA: NEGATIVE
Ketones, UA: NEGATIVE
Nitrite, UA: NEGATIVE
Protein,UA: NEGATIVE
RBC, UA: NEGATIVE
Specific Gravity, UA: 1.01 (ref 1.005–1.030)
Urobilinogen, Ur: 0.2 mg/dL (ref 0.2–1.0)
pH, UA: 6 (ref 5.0–7.5)

## 2022-08-10 DIAGNOSIS — Z9981 Dependence on supplemental oxygen: Secondary | ICD-10-CM | POA: Diagnosis not present

## 2022-08-10 DIAGNOSIS — G8929 Other chronic pain: Secondary | ICD-10-CM | POA: Diagnosis not present

## 2022-08-10 DIAGNOSIS — M545 Low back pain, unspecified: Secondary | ICD-10-CM | POA: Diagnosis not present

## 2022-08-10 DIAGNOSIS — J449 Chronic obstructive pulmonary disease, unspecified: Secondary | ICD-10-CM | POA: Diagnosis not present

## 2022-08-10 DIAGNOSIS — Z7951 Long term (current) use of inhaled steroids: Secondary | ICD-10-CM | POA: Diagnosis not present

## 2022-08-10 DIAGNOSIS — R911 Solitary pulmonary nodule: Secondary | ICD-10-CM | POA: Diagnosis not present

## 2022-08-10 DIAGNOSIS — J9611 Chronic respiratory failure with hypoxia: Secondary | ICD-10-CM | POA: Diagnosis not present

## 2022-08-10 DIAGNOSIS — M6389 Disorders of muscle in diseases classified elsewhere, multiple sites: Secondary | ICD-10-CM | POA: Diagnosis not present

## 2022-08-10 DIAGNOSIS — R778 Other specified abnormalities of plasma proteins: Secondary | ICD-10-CM | POA: Diagnosis not present

## 2022-08-10 DIAGNOSIS — K219 Gastro-esophageal reflux disease without esophagitis: Secondary | ICD-10-CM | POA: Diagnosis not present

## 2022-08-10 DIAGNOSIS — Z7982 Long term (current) use of aspirin: Secondary | ICD-10-CM | POA: Diagnosis not present

## 2022-08-10 DIAGNOSIS — N139 Obstructive and reflux uropathy, unspecified: Secondary | ICD-10-CM | POA: Diagnosis not present

## 2022-08-10 DIAGNOSIS — I110 Hypertensive heart disease with heart failure: Secondary | ICD-10-CM | POA: Diagnosis not present

## 2022-08-10 DIAGNOSIS — I5032 Chronic diastolic (congestive) heart failure: Secondary | ICD-10-CM | POA: Diagnosis not present

## 2022-08-10 DIAGNOSIS — Z9181 History of falling: Secondary | ICD-10-CM | POA: Diagnosis not present

## 2022-08-10 DIAGNOSIS — D649 Anemia, unspecified: Secondary | ICD-10-CM | POA: Diagnosis not present

## 2022-08-10 LAB — URINE CULTURE

## 2022-08-15 DIAGNOSIS — R778 Other specified abnormalities of plasma proteins: Secondary | ICD-10-CM | POA: Diagnosis not present

## 2022-08-15 DIAGNOSIS — D649 Anemia, unspecified: Secondary | ICD-10-CM | POA: Diagnosis not present

## 2022-08-15 DIAGNOSIS — J9611 Chronic respiratory failure with hypoxia: Secondary | ICD-10-CM | POA: Diagnosis not present

## 2022-08-15 DIAGNOSIS — M545 Low back pain, unspecified: Secondary | ICD-10-CM | POA: Diagnosis not present

## 2022-08-15 DIAGNOSIS — I5032 Chronic diastolic (congestive) heart failure: Secondary | ICD-10-CM | POA: Diagnosis not present

## 2022-08-15 DIAGNOSIS — G8929 Other chronic pain: Secondary | ICD-10-CM | POA: Diagnosis not present

## 2022-08-15 DIAGNOSIS — J449 Chronic obstructive pulmonary disease, unspecified: Secondary | ICD-10-CM | POA: Diagnosis not present

## 2022-08-15 DIAGNOSIS — Z9981 Dependence on supplemental oxygen: Secondary | ICD-10-CM | POA: Diagnosis not present

## 2022-08-15 DIAGNOSIS — I110 Hypertensive heart disease with heart failure: Secondary | ICD-10-CM | POA: Diagnosis not present

## 2022-08-15 DIAGNOSIS — Z9181 History of falling: Secondary | ICD-10-CM | POA: Diagnosis not present

## 2022-08-15 DIAGNOSIS — M6389 Disorders of muscle in diseases classified elsewhere, multiple sites: Secondary | ICD-10-CM | POA: Diagnosis not present

## 2022-08-15 DIAGNOSIS — Z7982 Long term (current) use of aspirin: Secondary | ICD-10-CM | POA: Diagnosis not present

## 2022-08-15 DIAGNOSIS — N139 Obstructive and reflux uropathy, unspecified: Secondary | ICD-10-CM | POA: Diagnosis not present

## 2022-08-15 DIAGNOSIS — Z7951 Long term (current) use of inhaled steroids: Secondary | ICD-10-CM | POA: Diagnosis not present

## 2022-08-15 DIAGNOSIS — R911 Solitary pulmonary nodule: Secondary | ICD-10-CM | POA: Diagnosis not present

## 2022-08-15 DIAGNOSIS — K219 Gastro-esophageal reflux disease without esophagitis: Secondary | ICD-10-CM | POA: Diagnosis not present

## 2022-08-17 ENCOUNTER — Ambulatory Visit (HOSPITAL_COMMUNITY): Payer: Medicare Other

## 2022-08-25 ENCOUNTER — Ambulatory Visit (HOSPITAL_COMMUNITY)
Admission: RE | Admit: 2022-08-25 | Discharge: 2022-08-25 | Disposition: A | Discharge status: Home / Self Care | Payer: Medicare Other | Source: Ambulatory Visit | Attending: Family Medicine | Admitting: Family Medicine

## 2022-08-25 ENCOUNTER — Institutional Professional Consult (permissible substitution): Payer: Medicare Other | Admitting: Internal Medicine

## 2022-08-25 DIAGNOSIS — Z1231 Encounter for screening mammogram for malignant neoplasm of breast: Secondary | ICD-10-CM | POA: Insufficient documentation

## 2022-08-26 DIAGNOSIS — G894 Chronic pain syndrome: Secondary | ICD-10-CM | POA: Diagnosis not present

## 2022-08-26 DIAGNOSIS — I10 Essential (primary) hypertension: Secondary | ICD-10-CM | POA: Diagnosis not present

## 2022-08-26 DIAGNOSIS — R5383 Other fatigue: Secondary | ICD-10-CM | POA: Diagnosis not present

## 2022-08-26 DIAGNOSIS — B3731 Acute candidiasis of vulva and vagina: Secondary | ICD-10-CM | POA: Diagnosis not present

## 2022-08-26 DIAGNOSIS — E559 Vitamin D deficiency, unspecified: Secondary | ICD-10-CM | POA: Diagnosis not present

## 2022-08-26 DIAGNOSIS — Z86010 Personal history of colonic polyps: Secondary | ICD-10-CM | POA: Diagnosis not present

## 2022-08-26 DIAGNOSIS — K5909 Other constipation: Secondary | ICD-10-CM | POA: Diagnosis not present

## 2022-08-26 DIAGNOSIS — M24849 Other specific joint derangements of unspecified hand, not elsewhere classified: Secondary | ICD-10-CM | POA: Diagnosis not present

## 2022-08-26 DIAGNOSIS — R7301 Impaired fasting glucose: Secondary | ICD-10-CM | POA: Diagnosis not present

## 2022-08-26 DIAGNOSIS — R338 Other retention of urine: Secondary | ICD-10-CM | POA: Diagnosis not present

## 2022-08-26 DIAGNOSIS — E782 Mixed hyperlipidemia: Secondary | ICD-10-CM | POA: Diagnosis not present

## 2022-08-26 DIAGNOSIS — J449 Chronic obstructive pulmonary disease, unspecified: Secondary | ICD-10-CM | POA: Diagnosis not present

## 2022-08-26 DIAGNOSIS — Z23 Encounter for immunization: Secondary | ICD-10-CM | POA: Diagnosis not present

## 2022-08-29 DIAGNOSIS — D649 Anemia, unspecified: Secondary | ICD-10-CM | POA: Diagnosis not present

## 2022-08-29 DIAGNOSIS — G8929 Other chronic pain: Secondary | ICD-10-CM | POA: Diagnosis not present

## 2022-08-29 DIAGNOSIS — I110 Hypertensive heart disease with heart failure: Secondary | ICD-10-CM | POA: Diagnosis not present

## 2022-08-29 DIAGNOSIS — M6389 Disorders of muscle in diseases classified elsewhere, multiple sites: Secondary | ICD-10-CM | POA: Diagnosis not present

## 2022-08-29 DIAGNOSIS — N139 Obstructive and reflux uropathy, unspecified: Secondary | ICD-10-CM | POA: Diagnosis not present

## 2022-08-29 DIAGNOSIS — Z9181 History of falling: Secondary | ICD-10-CM | POA: Diagnosis not present

## 2022-08-29 DIAGNOSIS — J9611 Chronic respiratory failure with hypoxia: Secondary | ICD-10-CM | POA: Diagnosis not present

## 2022-08-29 DIAGNOSIS — R911 Solitary pulmonary nodule: Secondary | ICD-10-CM | POA: Diagnosis not present

## 2022-08-29 DIAGNOSIS — Z7951 Long term (current) use of inhaled steroids: Secondary | ICD-10-CM | POA: Diagnosis not present

## 2022-08-29 DIAGNOSIS — K219 Gastro-esophageal reflux disease without esophagitis: Secondary | ICD-10-CM | POA: Diagnosis not present

## 2022-08-29 DIAGNOSIS — I5032 Chronic diastolic (congestive) heart failure: Secondary | ICD-10-CM | POA: Diagnosis not present

## 2022-08-29 DIAGNOSIS — Z7982 Long term (current) use of aspirin: Secondary | ICD-10-CM | POA: Diagnosis not present

## 2022-08-29 DIAGNOSIS — J449 Chronic obstructive pulmonary disease, unspecified: Secondary | ICD-10-CM | POA: Diagnosis not present

## 2022-08-29 DIAGNOSIS — M545 Low back pain, unspecified: Secondary | ICD-10-CM | POA: Diagnosis not present

## 2022-08-29 DIAGNOSIS — R778 Other specified abnormalities of plasma proteins: Secondary | ICD-10-CM | POA: Diagnosis not present

## 2022-08-29 DIAGNOSIS — Z9981 Dependence on supplemental oxygen: Secondary | ICD-10-CM | POA: Diagnosis not present

## 2022-09-06 ENCOUNTER — Ambulatory Visit: Payer: Medicare Other | Admitting: Internal Medicine

## 2022-09-07 DIAGNOSIS — J449 Chronic obstructive pulmonary disease, unspecified: Secondary | ICD-10-CM | POA: Diagnosis not present

## 2022-09-07 DIAGNOSIS — I110 Hypertensive heart disease with heart failure: Secondary | ICD-10-CM | POA: Diagnosis not present

## 2022-09-07 DIAGNOSIS — M6389 Disorders of muscle in diseases classified elsewhere, multiple sites: Secondary | ICD-10-CM | POA: Diagnosis not present

## 2022-09-07 DIAGNOSIS — M545 Low back pain, unspecified: Secondary | ICD-10-CM | POA: Diagnosis not present

## 2022-09-07 DIAGNOSIS — R911 Solitary pulmonary nodule: Secondary | ICD-10-CM | POA: Diagnosis not present

## 2022-09-07 DIAGNOSIS — J9611 Chronic respiratory failure with hypoxia: Secondary | ICD-10-CM | POA: Diagnosis not present

## 2022-09-07 DIAGNOSIS — Z9981 Dependence on supplemental oxygen: Secondary | ICD-10-CM | POA: Diagnosis not present

## 2022-09-07 DIAGNOSIS — Z7951 Long term (current) use of inhaled steroids: Secondary | ICD-10-CM | POA: Diagnosis not present

## 2022-09-07 DIAGNOSIS — Z9181 History of falling: Secondary | ICD-10-CM | POA: Diagnosis not present

## 2022-09-07 DIAGNOSIS — Z7982 Long term (current) use of aspirin: Secondary | ICD-10-CM | POA: Diagnosis not present

## 2022-09-07 DIAGNOSIS — G8929 Other chronic pain: Secondary | ICD-10-CM | POA: Diagnosis not present

## 2022-09-07 DIAGNOSIS — N139 Obstructive and reflux uropathy, unspecified: Secondary | ICD-10-CM | POA: Diagnosis not present

## 2022-09-07 DIAGNOSIS — D649 Anemia, unspecified: Secondary | ICD-10-CM | POA: Diagnosis not present

## 2022-09-07 DIAGNOSIS — K219 Gastro-esophageal reflux disease without esophagitis: Secondary | ICD-10-CM | POA: Diagnosis not present

## 2022-09-07 DIAGNOSIS — R778 Other specified abnormalities of plasma proteins: Secondary | ICD-10-CM | POA: Diagnosis not present

## 2022-09-07 DIAGNOSIS — I5032 Chronic diastolic (congestive) heart failure: Secondary | ICD-10-CM | POA: Diagnosis not present

## 2022-09-13 DIAGNOSIS — H02831 Dermatochalasis of right upper eyelid: Secondary | ICD-10-CM | POA: Diagnosis not present

## 2022-09-13 DIAGNOSIS — H53483 Generalized contraction of visual field, bilateral: Secondary | ICD-10-CM | POA: Diagnosis not present

## 2022-09-13 DIAGNOSIS — H02834 Dermatochalasis of left upper eyelid: Secondary | ICD-10-CM | POA: Diagnosis not present

## 2022-09-13 DIAGNOSIS — H02413 Mechanical ptosis of bilateral eyelids: Secondary | ICD-10-CM | POA: Diagnosis not present

## 2022-09-13 DIAGNOSIS — H57813 Brow ptosis, bilateral: Secondary | ICD-10-CM | POA: Diagnosis not present

## 2022-09-13 DIAGNOSIS — H53453 Other localized visual field defect, bilateral: Secondary | ICD-10-CM | POA: Diagnosis not present

## 2022-09-15 DIAGNOSIS — M6389 Disorders of muscle in diseases classified elsewhere, multiple sites: Secondary | ICD-10-CM | POA: Diagnosis not present

## 2022-09-15 DIAGNOSIS — Z7951 Long term (current) use of inhaled steroids: Secondary | ICD-10-CM | POA: Diagnosis not present

## 2022-09-15 DIAGNOSIS — Z9181 History of falling: Secondary | ICD-10-CM | POA: Diagnosis not present

## 2022-09-15 DIAGNOSIS — D649 Anemia, unspecified: Secondary | ICD-10-CM | POA: Diagnosis not present

## 2022-09-15 DIAGNOSIS — J9611 Chronic respiratory failure with hypoxia: Secondary | ICD-10-CM | POA: Diagnosis not present

## 2022-09-15 DIAGNOSIS — K219 Gastro-esophageal reflux disease without esophagitis: Secondary | ICD-10-CM | POA: Diagnosis not present

## 2022-09-15 DIAGNOSIS — G8929 Other chronic pain: Secondary | ICD-10-CM | POA: Diagnosis not present

## 2022-09-15 DIAGNOSIS — I5032 Chronic diastolic (congestive) heart failure: Secondary | ICD-10-CM | POA: Diagnosis not present

## 2022-09-15 DIAGNOSIS — M545 Low back pain, unspecified: Secondary | ICD-10-CM | POA: Diagnosis not present

## 2022-09-15 DIAGNOSIS — I110 Hypertensive heart disease with heart failure: Secondary | ICD-10-CM | POA: Diagnosis not present

## 2022-09-15 DIAGNOSIS — N139 Obstructive and reflux uropathy, unspecified: Secondary | ICD-10-CM | POA: Diagnosis not present

## 2022-09-15 DIAGNOSIS — Z9981 Dependence on supplemental oxygen: Secondary | ICD-10-CM | POA: Diagnosis not present

## 2022-09-15 DIAGNOSIS — J449 Chronic obstructive pulmonary disease, unspecified: Secondary | ICD-10-CM | POA: Diagnosis not present

## 2022-09-15 DIAGNOSIS — R911 Solitary pulmonary nodule: Secondary | ICD-10-CM | POA: Diagnosis not present

## 2022-09-15 DIAGNOSIS — R778 Other specified abnormalities of plasma proteins: Secondary | ICD-10-CM | POA: Diagnosis not present

## 2022-09-15 DIAGNOSIS — Z7982 Long term (current) use of aspirin: Secondary | ICD-10-CM | POA: Diagnosis not present

## 2022-09-23 DIAGNOSIS — Z9981 Dependence on supplemental oxygen: Secondary | ICD-10-CM | POA: Diagnosis not present

## 2022-09-23 DIAGNOSIS — R778 Other specified abnormalities of plasma proteins: Secondary | ICD-10-CM | POA: Diagnosis not present

## 2022-09-23 DIAGNOSIS — I5032 Chronic diastolic (congestive) heart failure: Secondary | ICD-10-CM | POA: Diagnosis not present

## 2022-09-23 DIAGNOSIS — J9611 Chronic respiratory failure with hypoxia: Secondary | ICD-10-CM | POA: Diagnosis not present

## 2022-09-23 DIAGNOSIS — G8929 Other chronic pain: Secondary | ICD-10-CM | POA: Diagnosis not present

## 2022-09-23 DIAGNOSIS — J449 Chronic obstructive pulmonary disease, unspecified: Secondary | ICD-10-CM | POA: Diagnosis not present

## 2022-09-23 DIAGNOSIS — R911 Solitary pulmonary nodule: Secondary | ICD-10-CM | POA: Diagnosis not present

## 2022-09-23 DIAGNOSIS — M6389 Disorders of muscle in diseases classified elsewhere, multiple sites: Secondary | ICD-10-CM | POA: Diagnosis not present

## 2022-09-23 DIAGNOSIS — M545 Low back pain, unspecified: Secondary | ICD-10-CM | POA: Diagnosis not present

## 2022-09-23 DIAGNOSIS — N139 Obstructive and reflux uropathy, unspecified: Secondary | ICD-10-CM | POA: Diagnosis not present

## 2022-09-23 DIAGNOSIS — Z7982 Long term (current) use of aspirin: Secondary | ICD-10-CM | POA: Diagnosis not present

## 2022-09-23 DIAGNOSIS — Z7951 Long term (current) use of inhaled steroids: Secondary | ICD-10-CM | POA: Diagnosis not present

## 2022-09-23 DIAGNOSIS — D649 Anemia, unspecified: Secondary | ICD-10-CM | POA: Diagnosis not present

## 2022-09-23 DIAGNOSIS — Z9181 History of falling: Secondary | ICD-10-CM | POA: Diagnosis not present

## 2022-09-23 DIAGNOSIS — I110 Hypertensive heart disease with heart failure: Secondary | ICD-10-CM | POA: Diagnosis not present

## 2022-09-23 DIAGNOSIS — K219 Gastro-esophageal reflux disease without esophagitis: Secondary | ICD-10-CM | POA: Diagnosis not present

## 2022-10-06 ENCOUNTER — Ambulatory Visit: Payer: Medicare Other | Admitting: Internal Medicine

## 2022-10-06 NOTE — Progress Notes (Deleted)
[name], female    DOB: 17-Mar-1951    MRN: [id number]   Brief patient profile:  18   yowf  quit smoking 2014  referred to pulmonary clinic in [place]  07/26/2022 by E[place]i for copd GOLD 4  / 02 dep  then admit:  Admit date: 06/29/2022 Discharge date: 07/07/2022 Admitted From: Home Disposition: SNF Recommendations for Outpatient Follow-up:  Please obtain BMP and CBC with differential in 1 week Outpatient follow-up with pulmonology about left lower lobe nodule Please follow up on the following pending results: None        Hospital course 71 year old F with PMH of COPD/chronic RF on 2 L, diastolic CHF, HTN, chronic back pain, urinary retention and anxiety presenting with progressive confusion and fever, and admitted for severe sepsis and acute metabolic encephalopathy due to acute pyelonephritis and E. coli bacteremia.  Patient was febrile to 101.7 with leukocytosis to 22 and hypotension to 90/41.  She was lethargic and had an AKI with creatinine of 1.3.  She also had elevated troponin to 902 that has trended up to 1114.  EKG without significant acute ischemic finding.  CT head negative.  CT chest abdomen and pelvis with contrast showed 7 mm LLL nodule, left pyelonephritis and minimal fullness of proximal ureter without obstruction.  Cultures obtained.  Foley catheter placed.  Patient was started on IV fluid and broad-spectrum antibiotics and admitted.  Cardiology consulted.    TTE with LVEF of 55 to 60%, G1 DD and moderate TVR.  Nuclear stress test with normal LV perfusion and no evidence of ischemia but poor quality due to patient's movement.    CTA chest PE protocol on 7/8 with increased clustered ill-defined GG nodularity in the superior segment of LLL and scattered tiny centrilobular nodules in both lungs consistent with infectious or inflammatory bronchiolitis and concern for aspiration pneumonia given the small amount of secretion in trachea.  MRI lumbar spine without acute finding  or osteomyelitis but multilevel spondylosis and moderate bilateral neuroforaminal stenosis at L5-S1.   Patient blood culture grew pansensitive E. coli.  Antibiotic de-escalated to IV Ancef.  She is discharged on p.o. cefadroxil for 6 more days to complete treatment course for E. coli bacteremia and pyelonephritis.    Therapy recommended SNF.   See individual problem list below for more.    Problems addressed during this hospitalization Principal Problem:   Severe sepsis ([place]) Active Problems:   Urinary retention   COPD exacerbation (HCC)   Anxiety   Acute on chronic diastolic CHF (congestive heart failure) (HCC)   Troponin level elevated   Acute pyelonephritis   E coli bacteremia   Acute metabolic encephalopathy   Acute on chronic respiratory failure with hypoxia and hypercapnia (HCC)   Urinary tract infection   Chronic lower back pain   Pulmonary nodule   Obesity (BMI 30-39.9)   Normocytic anemia   Severe sepsis due to acute left pyelonephritis and E. coli bacteremia in the setting of urinary retention, and possible aspiration pneumonia: POA.  Patient had fever, leukocytosis, hypotension, AKI and encephalopathy on presentation.  Blood culture with pansensitive E. coli.  Imaging concerning for left pyelonephritis and left ureteral fullness without obstructing stone.  Imaging also raises concern about left lung pneumonia and possible aspiration. -7/5 Vanco and cefepime>> 7/6 ceftriaxone> 7/10 Ancef> 7/13-7/19 cefadroxil -Doxycycline 7/12-4/58   Acute metabolic encephalopathy: Likely due to the above.  She is also at risk for polypharmacy due to pain medications other sedating medications.  Resolved. -Reorientation  and delirium precautions -Minimize sedating medications   Possible aspiration pneumonia: CTA chest negative for PE but raises concern for LLL pneumonia with concern for aspiration pneumonia given fluid in trachea. -Low risk for aspiration per SLP.   Acute on chronic  diastolic CHF: Evidence of fluid overload per exam by previous attending.  Likely in the setting of fluid resuscitation for sepsis.  TTE with normal LVEF, G1 DD and RVSP of 44.  BNP elevated but improved with diuretics.  Diuresed with IV Lasix.  Net -8.5 L.  Appears euvolemic on exam -P.o. Lasix 20 mg daily -Reassess BMP and fluid status at follow-up.   COPD exacerbation: Resolved. -Continue LABA/LAMA/ICS -As needed albuterol -Minimum oxygen to keep saturation above 90%   Acute on chronic hypoxic and hypercarbic respiratory failure/OSA not on CPAP: On 2 L at baseline.  -Minimum oxygen to keep saturation above 90%.  She is high risk for CO2 retention   Acute kidney injury: Resolved. Recent Labs (within last 365 days)             Recent Labs    06/29/22 2057 06/30/22 0451 07/01/22 0207 07/02/22 0122 07/03/22 0221 07/04/22 0049 07/05/22 0231 07/06/22 0301 07/07/22 0035  BUN 26* 25* 30* '23 20 21 19 15 12  '$ CREATININE 1.29* 1.12* 0.94 0.83 0.67 0.91 0.86 0.79 0.64    -Recheck BMP in 1 week   Atypical chest pain/elevated troponin: Troponin elevated to 900 and peaked at 1114.  Demand ischemia?  TTE as above.  Nuclear stress test negative but poor quality.  Chest pain could be due to pneumonia as well.  Symptoms resolved.  Cardiology signed off.  At this point, we can say non-STEMI ruled out. -Continue aspirin and statin.   Essential hypertension: Normotensive off home Hyzaar. -Continue home Cardizem CD -P.o. Lasix 20 mg daily   Urinary retention: History of this.  She had Foley placed in ED. -Continue Flomax and bethanechol -Passed voiding trial.   Chronic low back pain-lumbar MRI without acute finding but spondylolysis with moderate bilateral neuroforaminal stenosis at L5-S1. -Continue therapy at SNF -Continue home Norco but not a great choice with her respiratory issue.   Normocytic anemia: Stable. -Recheck CBC at follow-up   8 mm ground-glass nodule in the left lower lobe.   PCP to arrange outpatient pulmonary follow-up within 1 to 2 weeks of discharge for follow-up and monitoring.   Obesity Body mass index is 32.26 kg/m.             History of Present Illness  07/26/2022  Pulmonary/ 1st office eval/ [name] / [place] [facility] home from NH x one week p above admit  Chief Complaint  Patient presents with   Consult    Consult for O2 dropping normally uses 2LO2 cont.   Dyspnea:  indoor walking room to room  Cough: no am flares/ sporadic daytime min mucoid production Sleep: on side bed is flat 2 pillows  SABA use: hfa 3-4 x daily  02:  2lpm hs and does not titrate daytime Rec Continue trelegy one click each am  - take two good drags then rinse gargle  Only use your albuterol as a rescue medication Ok to try albuterol 15 min before an activity (on alternating days)  that you know would usually make you short of breath Make sure you check your oxygen saturation  AT  your highest level of activity (not after you stop)   to be sure it stays over 90%  Please schedule a follow up  office visit in 6 weeks, sooner if needed with cxr on return    10/06/2022  f/u ov/[place] [facility]/[name] re: *** maint on *** needs cxr  No chief complaint on file.   Dyspnea:  *** Cough: *** Sleeping: *** SABA use: *** 02: *** Covid status: *** Lung cancer screening: ***   No obvious day to day or daytime variability or assoc excess/ purulent sputum or mucus plugs or hemoptysis or cp or chest tightness, subjective wheeze or overt sinus or hb symptoms.   *** without nocturnal  or early am exacerbation  of respiratory  c/o's or need for noct saba. Also denies any obvious fluctuation of symptoms with weather or environmental changes or other aggravating or alleviating factors except as outlined above   No unusual exposure hx or h/o childhood pna/ asthma or knowledge of premature birth.  Current Allergies, Complete Past Medical History, Past Surgical History, Family History,  and Social History were reviewed in [employer] record.  ROS  The following are not active complaints unless bolded Hoarseness, sore throat, dysphagia, dental problems, itching, sneezing,  nasal congestion or discharge of excess mucus or purulent secretions, ear ache,   fever, chills, sweats, unintended wt loss or wt gain, classically pleuritic or exertional cp,  orthopnea pnd or arm/hand swelling  or leg swelling, presyncope, palpitations, abdominal pain, anorexia, nausea, vomiting, diarrhea  or change in bowel habits or change in bladder habits, change in stools or change in urine, dysuria, hematuria,  rash, arthralgias, visual complaints, headache, numbness, weakness or ataxia or problems with walking or coordination,  change in mood or  memory.        No outpatient medications have been marked as taking for the 10/06/22 encounter (Appointment) with Tanda Rockers, MD.               Past Medical History:  Diagnosis Date   Anxiety    Atrophic vaginitis 10/09/2015   Baker's cyst    left leg   Blood in urine 10/09/2015   CHF (congestive heart failure) (HCC)    COPD (chronic obstructive pulmonary disease) ([place])    Heart murmur    Hematuria 10/09/2015   Hypertension    Nicotine addiction 04/07/2014   Pneumonia    Urinary urgency 01/25/2016   Vaginal bleeding 10/09/2015      Objective:    Wt Readings from Last 3 Encounters:  08/08/22 170 lb (77.1 kg)  07/26/22 170 lb 3.2 oz (77.2 kg)  07/07/22 182 lb 1.6 oz (82.6 kg)     Vital signs reviewed  10/06/2022  - Note at rest 02 sats  ***% on ***   General appearance:    ***      Mod bar ***               Assessment

## 2022-10-18 ENCOUNTER — Ambulatory Visit: Payer: Medicare Other | Admitting: Internal Medicine

## 2022-10-18 ENCOUNTER — Encounter: Payer: Self-pay | Admitting: Internal Medicine

## 2022-10-18 VITALS — BP 130/72 | HR 80 | Temp 97.6°F | Ht 63.0 in | Wt 175.8 lb

## 2022-10-18 DIAGNOSIS — J9622 Acute and chronic respiratory failure with hypercapnia: Secondary | ICD-10-CM

## 2022-10-18 DIAGNOSIS — R911 Solitary pulmonary nodule: Secondary | ICD-10-CM

## 2022-10-18 DIAGNOSIS — J9621 Acute and chronic respiratory failure with hypoxia: Secondary | ICD-10-CM

## 2022-10-18 DIAGNOSIS — J449 Chronic obstructive pulmonary disease, unspecified: Secondary | ICD-10-CM

## 2022-10-18 NOTE — Assessment & Plan Note (Signed)
HC03 baseline around 44 as of admit 06/2022  - 07/26/2022 patient walked at a slow pace on 3LO2 cont patient had to stop after 150 ft  due to SOB and being tired. Walked patient to her car after visit and on 3LO2 pulse, her O2 was at 67% on personal pulse ox but ours was low 90s  - 10/18/2022   Walked on 3lpm POC  x  1  lap(s) =  approx 150  ft  @ slow pace, stopped due to tired> sob  with lowest 02 sats  92%   Advised; Make sure you check your oxygen saturation  AT  your highest level of activity (not after you stop)   to be sure it stays over 90% and adjust  02 flow upward to maintain this level if needed but remember to turn it back to previous settings when you stop (to conserve your supply).   Encouraged pushing  Sub max ex @ longer duration  to improve conditioning   F/u  6 months with CT chest in meantime (see separate a/p)

## 2022-10-18 NOTE — Assessment & Plan Note (Signed)
Quit smoking 2014  - PFT's  08/19/16  FEV1 0.54 (22 % ) ratio 0.43  p 1 % improvement from saba p Incruse prior to study with DLCO  10.53  (49%) and  FV curve classically concave      Group D (now reclassified as E) in terms of symptom/risk and laba/lama/ICS  therefore appropriate rx at this point >>>  Continue trelegy and approp saba  Re SABA :  I spent extra time with pt today reviewing appropriate use of albuterol for prn use on exertion with the following points: 1) saba is for relief of sob that does not improve by walking a slower pace or resting but rather if the pt does not improve after trying this first. 2) If the pt is convinced, as many are, that saba helps recover from activity faster then it's easy to tell if this is the case by re-challenging : ie stop, take the inhaler, then p 5 minutes try the exact same activity (intensity of workload) that just caused the symptoms and see if they are substantially diminished or not after saba 3) if there is an activity that reproducibly causes the symptoms, try the saba 15 min before the activity on alternate days   If in fact the saba really does help, then fine to continue to use it prn but advised may need to look closer at the maintenance regimen being used to achieve better control of airways disease with exertion.

## 2022-10-18 NOTE — Progress Notes (Unsigned)
[name], female    DOB: 09/07/1951    MRN: [id number]   Brief patient profile:  71  yowf  quit smoking 2014 and on 02 since 2009  referred to pulmonary clinic in [place]  07/26/2022 by [place] for copd GOLD 4  / 02 dep  then admit:  Admit date: 06/29/2022 Discharge date: 07/07/2022 Admitted From: Home Disposition: SNF Recommendations for Outpatient Follow-up:  Please obtain BMP and CBC with differential in 1 week Outpatient follow-up with pulmonology about left lower lobe nodule Please follow up on the following pending results: None        Hospital course 71 year old F with PMH of COPD/chronic RF on 2 L, diastolic CHF, HTN, chronic back pain, urinary retention and anxiety presenting with progressive confusion and fever, and admitted for severe sepsis and acute metabolic encephalopathy due to acute pyelonephritis and E. coli bacteremia.  Patient was febrile to 101.7 with leukocytosis to 22 and hypotension to 90/41.  She was lethargic and had an AKI with creatinine of 1.3.  She also had elevated troponin to 902 that has trended up to 1114.  EKG without significant acute ischemic finding.  CT head negative.  CT chest abdomen and pelvis with contrast showed 7 mm LLL nodule, left pyelonephritis and minimal fullness of proximal ureter without obstruction.  Cultures obtained.  Foley catheter placed.  Patient was started on IV fluid and broad-spectrum antibiotics and admitted.  Cardiology consulted.    TTE with LVEF of 55 to 60%, G1 DD and moderate TVR.  Nuclear stress test with normal LV perfusion and no evidence of ischemia but poor quality due to patient's movement.    CTA chest PE protocol on 7/8 with increased clustered ill-defined GG nodularity in the superior segment of LLL and scattered tiny centrilobular nodules in both lungs consistent with infectious or inflammatory bronchiolitis and concern for aspiration pneumonia given the small amount of secretion in trachea.  MRI lumbar spine  without acute finding or osteomyelitis but multilevel spondylosis and moderate bilateral neuroforaminal stenosis at L5-S1.   Patient blood culture grew pansensitive E. coli.  Antibiotic de-escalated to IV Ancef.  She is discharged on p.o. cefadroxil for 6 more days to complete treatment course for E. coli bacteremia and pyelonephritis.    Therapy recommended SNF.   See individual problem list below for more.    Problems addressed during this hospitalization Principal Problem:   Severe sepsis ([place]) Active Problems:   Urinary retention   COPD exacerbation (HCC)   Anxiety   Acute on chronic diastolic CHF (congestive heart failure) (HCC)   Troponin level elevated   Acute pyelonephritis   E coli bacteremia   Acute metabolic encephalopathy   Acute on chronic respiratory failure with hypoxia and hypercapnia (HCC)   Urinary tract infection   Chronic lower back pain   Pulmonary nodule   Obesity (BMI 30-39.9)   Normocytic anemia   Severe sepsis due to acute left pyelonephritis and E. coli bacteremia in the setting of urinary retention, and possible aspiration pneumonia: POA.  Patient had fever, leukocytosis, hypotension, AKI and encephalopathy on presentation.  Blood culture with pansensitive E. coli.  Imaging concerning for left pyelonephritis and left ureteral fullness without obstructing stone.  Imaging also raises concern about left lung pneumonia and possible aspiration. -7/5 Vanco and cefepime>> 7/6 ceftriaxone> 7/10 Ancef> 7/13-7/19 cefadroxil -Doxycycline 6/44-0/34   Acute metabolic encephalopathy: Likely due to the above.  She is also at risk for polypharmacy due to pain medications other sedating  medications.  Resolved. -Reorientation and delirium precautions -Minimize sedating medications   Possible aspiration pneumonia: CTA chest negative for PE but raises concern for LLL pneumonia with concern for aspiration pneumonia given fluid in trachea. -Low risk for aspiration per SLP.    Acute on chronic diastolic CHF: Evidence of fluid overload per exam by previous attending.  Likely in the setting of fluid resuscitation for sepsis.  TTE with normal LVEF, G1 DD and RVSP of 44.  BNP elevated but improved with diuretics.  Diuresed with IV Lasix.  Net -8.5 L.  Appears euvolemic on exam -P.o. Lasix 20 mg daily -Reassess BMP and fluid status at follow-up.   COPD exacerbation: Resolved. -Continue LABA/LAMA/ICS -As needed albuterol -Minimum oxygen to keep saturation above 90%   Acute on chronic hypoxic and hypercarbic respiratory failure/OSA not on CPAP: On 2 L at baseline.  -Minimum oxygen to keep saturation above 90%.  She is high risk for CO2 retention   Acute kidney injury: Resolved. Recent Labs (within last 365 days)             Recent Labs    06/29/22 2057 06/30/22 0451 07/01/22 0207 07/02/22 0122 07/03/22 0221 07/04/22 0049 07/05/22 0231 07/06/22 0301 07/07/22 0035  BUN 26* 25* 30* '23 20 21 19 15 12  '$ CREATININE 1.29* 1.12* 0.94 0.83 0.67 0.91 0.86 0.79 0.64    -Recheck BMP in 1 week   Atypical chest pain/elevated troponin: Troponin elevated to 900 and peaked at 1114.  Demand ischemia?  TTE as above.  Nuclear stress test negative but poor quality.  Chest pain could be due to pneumonia as well.  Symptoms resolved.  Cardiology signed off.  At this point, we can say non-STEMI ruled out. -Continue aspirin and statin.   Essential hypertension: Normotensive off home Hyzaar. -Continue home Cardizem CD -P.o. Lasix 20 mg daily   Urinary retention: History of this.  She had Foley placed in ED. -Continue Flomax and bethanechol -Passed voiding trial.   Chronic low back pain-lumbar MRI without acute finding but spondylolysis with moderate bilateral neuroforaminal stenosis at L5-S1. -Continue therapy at SNF -Continue home Norco but not a great choice with her respiratory issue.   Normocytic anemia: Stable. -Recheck CBC at follow-up   8 mm ground-glass nodule in  the left lower lobe.  PCP to arrange outpatient pulmonary follow-up within 1 to 2 weeks of discharge for follow-up and monitoring.   Obesity Body mass index is 32.26 kg/m.     History of Present Illness  07/26/2022  Pulmonary/ 1st office eval/ [name] / [facility] home from NH x one week p above admit  Chief Complaint  Patient presents with   Consult    Consult for O2 dropping normally uses 2LO2 cont.   Dyspnea:  indoor walking room to room  Cough: no am flares/ sporadic daytime min mucoid production Sleep: on side bed is flat 2 pillows  SABA use: hfa 3-4 x daily  02:  2lpm hs and does not titrate daytime Rec Continue trelegy one click each am  - take two good drags then rinse gargle  Only use your albuterol as a rescue medication Ok to try albuterol 15 min before an activity (on alternating days)  that you know would usually make you short of breath Make sure you check your oxygen saturation  AT  your highest level of activity (not after you stop)   to be sure it stays over 90%    10/18/2022  f/u ov/[facility]/[name] re: GOLD  4 maint on 02 24/7 and trelegy   Chief Complaint  Patient presents with   Follow-up    Doing well. Has concerns about a lung nodule mentioned from CT scan in July   Dyspnea:  walking more and planning walking outside, does not understand saba pre challenge or titration of 02 with exertion  Cough: much better  Sleeping: flat bed / 2 pillows  SABA use: hfa rarely / never neb  02: 2lpm hs up to 3lpm with sats 90 on 3lpm  Covid status: vax max  F/ u  Jan 02 2023 CT ordered   No obvious day to day or daytime variability or assoc excess/ purulent sputum or mucus plugs or hemoptysis or cp or chest tightness, subjective wheeze or overt sinus or hb symptoms.   Sleeping  without nocturnal  or early am exacerbation  of respiratory  c/o's or need for noct saba. Also denies any obvious fluctuation of symptoms with weather or environmental changes or other  aggravating or alleviating factors except as outlined above   No unusual exposure hx or h/o childhood pna/ asthma or knowledge of premature birth.  Current Allergies, Complete Past Medical History, Past Surgical History, Family History, and Social History were reviewed in [employer] record.  ROS  The following are not active complaints unless bolded Hoarseness, sore throat, dysphagia, dental problems, itching, sneezing,  nasal congestion or discharge of excess mucus or purulent secretions, ear ache,   fever, chills, sweats, unintended wt loss or wt gain, classically pleuritic or exertional cp,  orthopnea pnd or arm/hand swelling  or leg swelling, presyncope, palpitations, abdominal pain, anorexia, nausea, vomiting, diarrhea  or change in bowel habits or change in bladder habits, change in stools or change in urine, dysuria, hematuria,  rash, arthralgias, visual complaints, headache, numbness, weakness or ataxia or problems with walking or coordination,  change in mood or  memory.        Current Meds  Medication Sig   albuterol (PROVENTIL HFA;VENTOLIN HFA) 108 (90 BASE) MCG/ACT inhaler Inhale 2 puffs into the lungs every 6 (six) hours as needed for wheezing or shortness of breath. For shortness of breath   aspirin EC 81 MG tablet Take 81 mg by mouth in the morning. Swallow whole.   atorvastatin (LIPITOR) 80 MG tablet Take 1 tablet (80 mg total) by mouth daily.   bethanechol (URECHOLINE) 10 MG tablet Take 1 tablet (10 mg total) by mouth in the morning and at bedtime.   Cyanocobalamin (VITAMIN B-12 CR) 1000 MCG TBCR Take 1 tablet by mouth daily.   diltiazem (CARDIZEM CD) 180 MG 24 hr capsule Take 1 capsule (180 mg total) by mouth daily.   furosemide (LASIX) 20 MG tablet Take 1 tablet (20 mg total) by mouth daily.   HYDROcodone-acetaminophen (NORCO) 10-325 MG tablet Take 1 tablet by mouth 4 (four) times daily as needed.   losartan (COZAAR) 50 MG tablet Take 50 mg by mouth  daily.   melatonin 5 MG TABS Take 1 tablet (5 mg total) by mouth once as needed.   Multiple Vitamin (MULTIVITAMIN WITH MINERALS) TABS tablet Take 1 tablet by mouth in the morning.   pantoprazole (PROTONIX) 20 MG tablet Take 1 tablet (20 mg total) by mouth daily.   polyethylene glycol (MIRALAX / GLYCOLAX) 17 g packet Take 17 g by mouth daily as needed. Sometimes forgets to take everyday.   senna-docusate (SENOKOT-S) 8.6-50 MG tablet Take 1 tablet by mouth at bedtime.   tamsulosin (FLOMAX) 0.4  MG CAPS capsule Take 1 capsule (0.4 mg total) by mouth daily after supper.   TRELEGY ELLIPTA 100-62.5-25 MCG/INH AEPB Take 1 puff by mouth in the morning.   vitamin B-12 1000 MCG tablet Take 1 tablet (1,000 mcg total) by mouth daily.               Past Medical History:  Diagnosis Date   Anxiety    Atrophic vaginitis 10/09/2015   Baker's cyst    left leg   Blood in urine 10/09/2015   CHF (congestive heart failure) (HCC)    COPD (chronic obstructive pulmonary disease) (HCC)    Heart murmur    Hematuria 10/09/2015   Hypertension    Nicotine addiction 04/07/2014   Pneumonia    Urinary urgency 01/25/2016   Vaginal bleeding 10/09/2015      Objective:    Wt Readings from Last 3 Encounters:  10/18/22 175 lb 12.8 oz (79.7 kg)  08/08/22 170 lb (77.1 kg)  07/26/22 170 lb 3.2 oz (77.2 kg)     Vital signs reviewed  10/18/2022  - Note  02 sats  87% on POC 3lpm  immediately p  walking in   General appearance:    pleasant amb wf nad     HEENT :  Oropharynx  clear   Nasal turbinates nl    NECK :  without JVD/Nodes/TM/ nl carotid upstrokes bilaterally   LUNGS: no acc muscle use,  Mod barrel  contour chest wall with bilateral  Distant bs s audible wheeze and  without cough on insp or exp maneuvers and mod  Hyperresonant  to  percussion bilaterally     CV:  RRR  no s3 or murmur or increase in P2, and no edema   ABD:  soft and nontender with pos mid insp Hoover's  in the supine position. No  bruits or organomegaly appreciated, bowel sounds nl  MS:   Ext warm without deformities or   obvious joint restrictions , calf tenderness, cyanosis or clubbing  SKIN: warm and dry without lesions    NEURO:  alert, approp, nl sensorium with  no motor or cerebellar deficits apparent.             Assessment

## 2022-10-18 NOTE — Patient Instructions (Addendum)
Also  Ok to try albuterol 15 min before an activity (on alternating days)  that you know would usually make you short of breath and see if it makes any difference and if makes none then don't take albuterol after activity unless you can't catch your breath as this means it's the resting that helps, not the albuterol.      Make sure you check your oxygen saturation  AT  your highest level of activity (not after you stop)   to be sure it stays over 90% and adjust  02 flow upward to maintain this level if needed but remember to turn it back to previous settings when you stop (to conserve your supply).    CT chest without contrast 01/02/23   lung nodule  follow up   Please schedule a follow up visit in 6 months but call sooner if needed

## 2022-10-19 ENCOUNTER — Encounter: Payer: Self-pay | Admitting: Internal Medicine

## 2022-10-19 NOTE — Assessment & Plan Note (Addendum)
See CT chest 07/02/22 > repeat CT 01/02/23   Likely benign changes in setting of acute illness but if unchanged will rec LDSCT thru 2029 = 15 y since stopped smoking   Discussed in detail all the  indications, usual  risks and alternatives  relative to the benefits with patient who agrees to proceed with w/u as outlined.   Each maintenance medication was reviewed in detail including emphasizing most importantly the difference between maintenance and prns and under what circumstances the prns are to be triggered using an action plan format where appropriate.  Total time for H and P, chart review, counseling, reviewing dpi/hfa/02  device(s) , directly observing portions of ambulatory 02 saturation study/ and generating customized AVS unique to this office visit / same day charting = 32 min

## 2022-11-03 ENCOUNTER — Ambulatory Visit (HOSPITAL_COMMUNITY)
Admission: RE | Admit: 2022-11-03 | Discharge: 2022-11-03 | Disposition: A | Discharge status: Home / Self Care | Payer: Medicare Other | Source: Ambulatory Visit | Attending: Internal Medicine | Admitting: Internal Medicine

## 2022-11-03 DIAGNOSIS — J449 Chronic obstructive pulmonary disease, unspecified: Secondary | ICD-10-CM | POA: Insufficient documentation

## 2022-11-03 DIAGNOSIS — R059 Cough, unspecified: Secondary | ICD-10-CM | POA: Diagnosis not present

## 2022-11-03 DIAGNOSIS — R911 Solitary pulmonary nodule: Secondary | ICD-10-CM | POA: Diagnosis not present

## 2022-11-03 DIAGNOSIS — R0602 Shortness of breath: Secondary | ICD-10-CM | POA: Diagnosis not present

## 2022-11-07 ENCOUNTER — Telehealth: Payer: Self-pay | Admitting: Internal Medicine

## 2022-11-07 NOTE — Telephone Encounter (Signed)
error 

## 2022-11-15 ENCOUNTER — Ambulatory Visit: Payer: Medicare Other | Admitting: Internal Medicine

## 2022-12-01 ENCOUNTER — Ambulatory Visit (HOSPITAL_COMMUNITY)
Admission: RE | Admit: 2022-12-01 | Discharge: 2022-12-01 | Disposition: A | Discharge status: Home / Self Care | Payer: Medicare Other | Source: Ambulatory Visit | Attending: Internal Medicine | Admitting: Internal Medicine

## 2022-12-01 DIAGNOSIS — R0600 Dyspnea, unspecified: Secondary | ICD-10-CM | POA: Diagnosis not present

## 2022-12-01 DIAGNOSIS — R918 Other nonspecific abnormal finding of lung field: Secondary | ICD-10-CM | POA: Diagnosis not present

## 2022-12-01 DIAGNOSIS — E785 Hyperlipidemia, unspecified: Secondary | ICD-10-CM | POA: Diagnosis not present

## 2022-12-01 DIAGNOSIS — R911 Solitary pulmonary nodule: Secondary | ICD-10-CM | POA: Insufficient documentation

## 2022-12-01 DIAGNOSIS — N390 Urinary tract infection, site not specified: Secondary | ICD-10-CM | POA: Diagnosis not present

## 2022-12-01 DIAGNOSIS — J439 Emphysema, unspecified: Secondary | ICD-10-CM | POA: Diagnosis not present

## 2022-12-01 DIAGNOSIS — R109 Unspecified abdominal pain: Secondary | ICD-10-CM | POA: Diagnosis not present

## 2022-12-01 DIAGNOSIS — J479 Bronchiectasis, uncomplicated: Secondary | ICD-10-CM | POA: Diagnosis not present

## 2022-12-01 DIAGNOSIS — K219 Gastro-esophageal reflux disease without esophagitis: Secondary | ICD-10-CM | POA: Diagnosis not present

## 2022-12-05 ENCOUNTER — Other Ambulatory Visit (HOSPITAL_COMMUNITY): Payer: Self-pay | Admitting: Family Medicine

## 2022-12-05 ENCOUNTER — Telehealth: Payer: Self-pay | Admitting: Internal Medicine

## 2022-12-05 DIAGNOSIS — R109 Unspecified abdominal pain: Secondary | ICD-10-CM

## 2022-12-05 NOTE — Telephone Encounter (Signed)
Patient called again, very anxious to get results.

## 2022-12-05 NOTE — Telephone Encounter (Signed)
Done/ See result note.

## 2022-12-09 ENCOUNTER — Ambulatory Visit (HOSPITAL_COMMUNITY)
Admission: RE | Admit: 2022-12-09 | Discharge: 2022-12-09 | Disposition: A | Discharge status: Home / Self Care | Payer: Medicare Other | Source: Ambulatory Visit | Attending: Family Medicine | Admitting: Family Medicine

## 2022-12-09 DIAGNOSIS — R109 Unspecified abdominal pain: Secondary | ICD-10-CM | POA: Diagnosis not present

## 2022-12-15 ENCOUNTER — Other Ambulatory Visit (HOSPITAL_COMMUNITY): Payer: Self-pay | Admitting: Family Medicine

## 2022-12-15 DIAGNOSIS — R109 Unspecified abdominal pain: Secondary | ICD-10-CM

## 2022-12-27 ENCOUNTER — Telehealth: Payer: Self-pay | Admitting: Internal Medicine

## 2022-12-27 MED ORDER — ALBUTEROL SULFATE HFA 108 (90 BASE) MCG/ACT IN AERS: 2.0000 | INHALATION_SPRAY | Freq: Four times a day (QID) | RESPIRATORY_TRACT | 6 refills | Status: DC | PRN

## 2022-12-27 NOTE — Telephone Encounter (Signed)
Refill sent. Nothing further needed at this time.  

## 2023-01-17 ENCOUNTER — Telehealth: Payer: Self-pay

## 2023-01-17 ENCOUNTER — Other Ambulatory Visit: Payer: Self-pay

## 2023-01-17 DIAGNOSIS — R339 Retention of urine, unspecified: Secondary | ICD-10-CM

## 2023-01-17 MED ORDER — TAMSULOSIN HCL 0.4 MG PO CAPS: 0.4000 mg | ORAL_CAPSULE | Freq: Every day | ORAL | 3 refills | Status: DC

## 2023-01-17 NOTE — Telephone Encounter (Signed)
Rx sent. Patient called and made aware.

## 2023-01-17 NOTE — Telephone Encounter (Signed)
Patient called and advised they needed a refill on medication below.   Medication: tamsulosin (FLOMAX) 0.4 MG CAPS capsule     Pharmacy: [id number] - [place], [address]     Thank you

## 2023-01-27 ENCOUNTER — Ambulatory Visit (HOSPITAL_COMMUNITY): Payer: Medicare Other

## 2023-01-27 ENCOUNTER — Encounter (HOSPITAL_COMMUNITY): Payer: Self-pay

## 2023-02-28 ENCOUNTER — Other Ambulatory Visit (HOSPITAL_COMMUNITY): Payer: Self-pay | Admitting: Internal Medicine

## 2023-02-28 DIAGNOSIS — J449 Chronic obstructive pulmonary disease, unspecified: Secondary | ICD-10-CM | POA: Diagnosis not present

## 2023-02-28 DIAGNOSIS — R109 Unspecified abdominal pain: Secondary | ICD-10-CM | POA: Diagnosis not present

## 2023-02-28 DIAGNOSIS — R911 Solitary pulmonary nodule: Secondary | ICD-10-CM | POA: Diagnosis not present

## 2023-02-28 DIAGNOSIS — E785 Hyperlipidemia, unspecified: Secondary | ICD-10-CM | POA: Diagnosis not present

## 2023-02-28 DIAGNOSIS — Z87891 Personal history of nicotine dependence: Secondary | ICD-10-CM | POA: Diagnosis not present

## 2023-02-28 DIAGNOSIS — R7301 Impaired fasting glucose: Secondary | ICD-10-CM | POA: Diagnosis not present

## 2023-02-28 DIAGNOSIS — E559 Vitamin D deficiency, unspecified: Secondary | ICD-10-CM | POA: Diagnosis not present

## 2023-02-28 DIAGNOSIS — Z79899 Other long term (current) drug therapy: Secondary | ICD-10-CM | POA: Diagnosis not present

## 2023-02-28 DIAGNOSIS — I700 Atherosclerosis of aorta: Secondary | ICD-10-CM | POA: Diagnosis not present

## 2023-03-01 ENCOUNTER — Other Ambulatory Visit (HOSPITAL_COMMUNITY): Payer: Self-pay | Admitting: Internal Medicine

## 2023-03-01 DIAGNOSIS — R109 Unspecified abdominal pain: Secondary | ICD-10-CM

## 2023-03-06 ENCOUNTER — Ambulatory Visit: Payer: Medicare Other | Admitting: Internal Medicine

## 2023-03-06 ENCOUNTER — Telehealth: Payer: Self-pay | Admitting: Internal Medicine

## 2023-03-06 DIAGNOSIS — J449 Chronic obstructive pulmonary disease, unspecified: Secondary | ICD-10-CM

## 2023-03-06 NOTE — Progress Notes (Deleted)
[name], female    DOB: 09/07/1951    MRN: [id number]   Brief patient profile:  50  yowf  quit smoking 2014 and on 02 since 2009  referred to pulmonary clinic in [place]  07/26/2022 by Eboni for copd GOLD 4  / 02 dep  then admit:  Admit date: 06/29/2022 Discharge date: 07/07/2022 Admitted From: Home Disposition: SNF Recommendations for Outpatient Follow-up:  Please obtain BMP and CBC with differential in 1 week Outpatient follow-up with pulmonology about left lower lobe nodule Please follow up on the following pending results: None        Hospital course 72 year old F with PMH of COPD/chronic RF on 2 L, diastolic CHF, HTN, chronic back pain, urinary retention and anxiety presenting with progressive confusion and fever, and admitted for severe sepsis and acute metabolic encephalopathy due to acute pyelonephritis and E. coli bacteremia.  Patient was febrile to 101.7 with leukocytosis to 22 and hypotension to 90/41.  She was lethargic and had an AKI with creatinine of 1.3.  She also had elevated troponin to 902 that has trended up to 1114.  EKG without significant acute ischemic finding.  CT head negative.  CT chest abdomen and pelvis with contrast showed 7 mm LLL nodule, left pyelonephritis and minimal fullness of proximal ureter without obstruction.  Cultures obtained.  Foley catheter placed.  Patient was started on IV fluid and broad-spectrum antibiotics and admitted.  Cardiology consulted.    TTE with LVEF of 55 to 60%, G1 DD and moderate TVR.  Nuclear stress test with normal LV perfusion and no evidence of ischemia but poor quality due to patient's movement.    CTA chest PE protocol on 7/8 with increased clustered ill-defined GG nodularity in the superior segment of LLL and scattered tiny centrilobular nodules in both lungs consistent with infectious or inflammatory bronchiolitis and concern for aspiration pneumonia given the small amount of secretion in trachea.  MRI lumbar spine  without acute finding or osteomyelitis but multilevel spondylosis and moderate bilateral neuroforaminal stenosis at L5-S1.   Patient blood culture grew pansensitive E. coli.  Antibiotic de-escalated to IV Ancef.  She is discharged on p.o. cefadroxil for 6 more days to complete treatment course for E. coli bacteremia and pyelonephritis.    Therapy recommended SNF.   See individual problem list below for more.    Problems addressed during this hospitalization Principal Problem:   Severe sepsis ([place]) Active Problems:   Urinary retention   COPD exacerbation (HCC)   Anxiety   Acute on chronic diastolic CHF (congestive heart failure) (HCC)   Troponin level elevated   Acute pyelonephritis   E coli bacteremia   Acute metabolic encephalopathy   Acute on chronic respiratory failure with hypoxia and hypercapnia (HCC)   Urinary tract infection   Chronic lower back pain   Pulmonary nodule   Obesity (BMI 30-39.9)   Normocytic anemia   Severe sepsis due to acute left pyelonephritis and E. coli bacteremia in the setting of urinary retention, and possible aspiration pneumonia: POA.  Patient had fever, leukocytosis, hypotension, AKI and encephalopathy on presentation.  Blood culture with pansensitive E. coli.  Imaging concerning for left pyelonephritis and left ureteral fullness without obstructing stone.  Imaging also raises concern about left lung pneumonia and possible aspiration. -7/5 Vanco and cefepime>> 7/6 ceftriaxone> 7/10 Ancef> 7/13-7/19 cefadroxil -Doxycycline 6/44-0/34   Acute metabolic encephalopathy: Likely due to the above.  She is also at risk for polypharmacy due to pain medications other sedating  medications.  Resolved. -Reorientation and delirium precautions -Minimize sedating medications   Possible aspiration pneumonia: CTA chest negative for PE but raises concern for LLL pneumonia with concern for aspiration pneumonia given fluid in trachea. -Low risk for aspiration per SLP.    Acute on chronic diastolic CHF: Evidence of fluid overload per exam by previous attending.  Likely in the setting of fluid resuscitation for sepsis.  TTE with normal LVEF, G1 DD and RVSP of 44.  BNP elevated but improved with diuretics.  Diuresed with IV Lasix.  Net -8.5 L.  Appears euvolemic on exam -P.o. Lasix 20 mg daily -Reassess BMP and fluid status at follow-up.   COPD exacerbation: Resolved. -Continue LABA/LAMA/ICS -As needed albuterol -Minimum oxygen to keep saturation above 90%   Acute on chronic hypoxic and hypercarbic respiratory failure/OSA not on CPAP: On 2 L at baseline.  -Minimum oxygen to keep saturation above 90%.  She is high risk for CO2 retention   Acute kidney injury: Resolved. Recent Labs (within last 365 days)             Recent Labs    06/29/22 2057 06/30/22 0451 07/01/22 0207 07/02/22 0122 07/03/22 0221 07/04/22 0049 07/05/22 0231 07/06/22 0301 07/07/22 0035  BUN 26* 25* 30* '23 20 21 19 15 12  '$ CREATININE 1.29* 1.12* 0.94 0.83 0.67 0.91 0.86 0.79 0.64    -Recheck BMP in 1 week   Atypical chest pain/elevated troponin: Troponin elevated to 900 and peaked at 1114.  Demand ischemia?  TTE as above.  Nuclear stress test negative but poor quality.  Chest pain could be due to pneumonia as well.  Symptoms resolved.  Cardiology signed off.  At this point, we can say non-STEMI ruled out. -Continue aspirin and statin.   Essential hypertension: Normotensive off home Hyzaar. -Continue home Cardizem CD -P.o. Lasix 20 mg daily   Urinary retention: History of this.  She had Foley placed in ED. -Continue Flomax and bethanechol -Passed voiding trial.   Chronic low back pain-lumbar MRI without acute finding but spondylolysis with moderate bilateral neuroforaminal stenosis at L5-S1. -Continue therapy at SNF -Continue home Norco but not a great choice with her respiratory issue.   Normocytic anemia: Stable. -Recheck CBC at follow-up   8 mm ground-glass nodule in  the left lower lobe.  PCP to arrange outpatient pulmonary follow-up within 1 to 2 weeks of discharge for follow-up and monitoring.   Obesity Body mass index is 32.26 kg/m.     History of Present Illness  07/26/2022  Pulmonary/ 1st office eval/ [facility] / [place] [facility] home from NH x one week p above admit  Chief Complaint  Patient presents with   Consult    Consult for O2 dropping normally uses 2LO2 cont.   Dyspnea:  indoor walking room to room  Cough: no am flares/ sporadic daytime min mucoid production Sleep: on side bed is flat 2 pillows  SABA use: hfa 3-4 x daily  02:  2lpm hs and does not titrate daytime Rec Continue trelegy one click each am  - take two good drags then rinse gargle  Only use your albuterol as a rescue medication Ok to try albuterol 15 min before an activity (on alternating days)  that you know would usually make you short of breath Make sure you check your oxygen saturation  AT  your highest level of activity (not after you stop)   to be sure it stays over 90%    10/18/2022  f/u ov/[place] office/Hala Narula re: GOLD  4 maint on 02 24/7 and trelegy   Chief Complaint  Patient presents with   Follow-up    Doing well. Has concerns about a lung nodule mentioned from CT scan in July   Dyspnea:  walking more and planning walking outside, does not understand saba pre challenge or titration of 02 with exertion  Cough: much better  Sleeping: flat bed / 2 pillows  SABA use: hfa rarely / never neb  02: 2lpm hs up to 3lpm with sats 90 on 3lpm  Covid status: vax max  Rec Also  Ok to try albuterol 15 min before an activity (on alternating days)  that you know would usually make you short of breath Make sure you check your oxygen saturation  AT  your highest level of activity (not after you stop)   to be sure it stays over 90%     Please schedule a follow up visit in 6 months but call sooner if needed     03/06/2023  f/u ov/[place] office/[facility] [place] re: *** maint on ***   cxr needed  No chief complaint on file.   Dyspnea:  *** Cough: *** Sleeping: *** SABA use: *** 02: *** Covid status: *** Lung cancer screening: ***   No obvious day to day or daytime variability or assoc excess/ purulent sputum or mucus plugs or hemoptysis or cp or chest tightness, subjective wheeze or overt sinus or hb symptoms.   *** without nocturnal  or early am exacerbation  of respiratory  c/o's or need for noct saba. Also denies any obvious fluctuation of symptoms with weather or environmental changes or other aggravating or alleviating factors except as outlined above   No unusual exposure hx or h/o childhood pna/ asthma or knowledge of premature birth.  Current Allergies, Complete Past Medical History, Past Surgical History, Family History, and Social History were reviewed in [employer] record.  ROS  The following are not active complaints unless bolded Hoarseness, sore throat, dysphagia, dental problems, itching, sneezing,  nasal congestion or discharge of excess mucus or purulent secretions, ear ache,   fever, chills, sweats, unintended wt loss or wt gain, classically pleuritic or exertional cp,  orthopnea pnd or arm/hand swelling  or leg swelling, presyncope, palpitations, abdominal pain, anorexia, nausea, vomiting, diarrhea  or change in bowel habits or change in bladder habits, change in stools or change in urine, dysuria, hematuria,  rash, arthralgias, visual complaints, headache, numbness, weakness or ataxia or problems with walking or coordination,  change in mood or  memory.        No outpatient medications have been marked as taking for the 03/06/23 encounter (Appointment) with Tanda Rockers, MD.              Past Medical History:  Diagnosis Date   Anxiety    Atrophic vaginitis 10/09/2015   Baker's cyst    left leg   Blood in urine 10/09/2015   CHF (congestive heart failure) (HCC)    COPD (chronic obstructive pulmonary disease) (HCC)     Heart murmur    Hematuria 10/09/2015   Hypertension    Nicotine addiction 04/07/2014   Pneumonia    Urinary urgency 01/25/2016   Vaginal bleeding 10/09/2015      Objective:    03/06/2023        ***   10/18/22 175 lb 12.8 oz (79.7 kg)  08/08/22 170 lb (77.1 kg)  07/26/22 170 lb 3.2 oz (77.2 kg)    Vital signs reviewed  03/06/2023  - Note at rest 02 sats  ***% on ***   General appearance:    ***      Mod barr***     Assessment

## 2023-03-06 NOTE — Telephone Encounter (Signed)
PT called to cancel appt today and asked that Meagan from [facility] give her a call to reschedule.

## 2023-03-06 NOTE — Telephone Encounter (Signed)
ATC unable to LVM.

## 2023-03-06 NOTE — Telephone Encounter (Signed)
Called and talked to patient. Rescheduled her ov f/u for May as requested by patient.  Per last result note patient needs CXR before appt. Ordered CXR and patient aware to go to [facility] before appt to have done.  Nothing further needed at this time.

## 2023-04-05 DIAGNOSIS — J069 Acute upper respiratory infection, unspecified: Secondary | ICD-10-CM | POA: Diagnosis not present

## 2023-04-28 ENCOUNTER — Ambulatory Visit (HOSPITAL_COMMUNITY): Payer: Medicare Other

## 2023-04-30 NOTE — Progress Notes (Deleted)
[name], female    DOB: 07-Sep-1951    MRN: [id number]   Brief patient profile:  72  yowf  quit smoking 2014 and on 02 since 2009  referred to pulmonary clinic in [place]  07/26/2022 by Eboni for copd GOLD 4  / 02 dep  then admit:  Admit date: 06/29/2022 Discharge date: 07/07/2022 Admitted From: Home Disposition: SNF Recommendations for Outpatient Follow-up:  Please obtain BMP and CBC with differential in 1 week Outpatient follow-up with pulmonology about left lower lobe nodule Please follow up on the following pending results: None        Hospital course 72 year old F with PMH of COPD/chronic RF on 2 L, diastolic CHF, HTN, chronic back pain, urinary retention and anxiety presenting with progressive confusion and fever, and admitted for severe sepsis and acute metabolic encephalopathy due to acute pyelonephritis and E. coli bacteremia.  Patient was febrile to 101.7 with leukocytosis to 22 and hypotension to 90/41.  She was lethargic and had an AKI with creatinine of 1.3.  She also had elevated troponin to 902 that has trended up to 1114.  EKG without significant acute ischemic finding.  CT head negative.  CT chest abdomen and pelvis with contrast showed 7 mm LLL nodule, left pyelonephritis and minimal fullness of proximal ureter without obstruction.  Cultures obtained.  Foley catheter placed.  Patient was started on IV fluid and broad-spectrum antibiotics and admitted.  Cardiology consulted.    TTE with LVEF of 55 to 60%, G1 DD and moderate TVR.  Nuclear stress test with normal LV perfusion and no evidence of ischemia but poor quality due to patient's movement.    CTA chest PE protocol on 7/8 with increased clustered ill-defined GG nodularity in the superior segment of LLL and scattered tiny centrilobular nodules in both lungs consistent with infectious or inflammatory bronchiolitis and concern for aspiration pneumonia given the small amount of secretion in trachea.  MRI lumbar spine  without acute finding or osteomyelitis but multilevel spondylosis and moderate bilateral neuroforaminal stenosis at L5-S1.   Patient blood culture grew pansensitive E. coli.  Antibiotic de-escalated to IV Ancef.  She is discharged on p.o. cefadroxil for 6 more days to complete treatment course for E. coli bacteremia and pyelonephritis.    Therapy recommended SNF.   See individual problem list below for more.    Problems addressed during this hospitalization Principal Problem:   Severe sepsis (HCC) Active Problems:   Urinary retention   COPD exacerbation (HCC)   Anxiety   Acute on chronic diastolic CHF (congestive heart failure) (HCC)   Troponin level elevated   Acute pyelonephritis   E coli bacteremia   Acute metabolic encephalopathy   Acute on chronic respiratory failure with hypoxia and hypercapnia (HCC)   Urinary tract infection   Chronic lower back pain   Pulmonary nodule   Obesity (BMI 30-39.9)   Normocytic anemia   Severe sepsis due to acute left pyelonephritis and E. coli bacteremia in the setting of urinary retention, and possible aspiration pneumonia: POA.  Patient had fever, leukocytosis, hypotension, AKI and encephalopathy on presentation.  Blood culture with pansensitive E. coli.  Imaging concerning for left pyelonephritis and left ureteral fullness without obstructing stone.  Imaging also raises concern about left lung pneumonia and possible aspiration. -7/5 Vanco and cefepime>> 7/6 ceftriaxone> 7/10 Ancef> 7/13-7/19 cefadroxil -Doxycycline 7/11-7/15   Acute metabolic encephalopathy: Likely due to the above.  She is also at risk for polypharmacy due to pain medications other sedating  medications.  Resolved. -Reorientation and delirium precautions -Minimize sedating medications   Possible aspiration pneumonia: CTA chest negative for PE but raises concern for LLL pneumonia with concern for aspiration pneumonia given fluid in trachea. -Low risk for aspiration per SLP.    Acute on chronic diastolic CHF: Evidence of fluid overload per exam by previous attending.  Likely in the setting of fluid resuscitation for sepsis.  TTE with normal LVEF, G1 DD and RVSP of 44.  BNP elevated but improved with diuretics.  Diuresed with IV Lasix.  Net -8.5 L.  Appears euvolemic on exam -P.o. Lasix 20 mg daily -Reassess BMP and fluid status at follow-up.   COPD exacerbation: Resolved. -Continue LABA/LAMA/ICS -As needed albuterol -Minimum oxygen to keep saturation above 90%   Acute on chronic hypoxic and hypercarbic respiratory failure/OSA not on CPAP: On 2 L at baseline.  -Minimum oxygen to keep saturation above 90%.  She is high risk for CO2 retention   Acute kidney injury: Resolved. Recent Labs (within last 365 days)             Recent Labs    06/29/22 2057 06/30/22 0451 07/01/22 0207 07/02/22 0122 07/03/22 0221 07/04/22 0049 07/05/22 0231 07/06/22 0301 07/07/22 0035  BUN 26* 25* 30* 23 20 21 19 15 12   CREATININE 1.29* 1.12* 0.94 0.83 0.67 0.91 0.86 0.79 0.64    -Recheck BMP in 1 week   Atypical chest pain/elevated troponin: Troponin elevated to 900 and peaked at 1114.  Demand ischemia?  TTE as above.  Nuclear stress test negative but poor quality.  Chest pain could be due to pneumonia as well.  Symptoms resolved.  Cardiology signed off.  At this point, we can say non-STEMI ruled out. -Continue aspirin and statin.   Essential hypertension: Normotensive off home Hyzaar. -Continue home Cardizem CD -P.o. Lasix 20 mg daily   Urinary retention: History of this.  She had Foley placed in ED. -Continue Flomax and bethanechol -Passed voiding trial.   Chronic low back pain-lumbar MRI without acute finding but spondylolysis with moderate bilateral neuroforaminal stenosis at L5-S1. -Continue therapy at SNF -Continue home Norco but not a great choice with her respiratory issue.   Normocytic anemia: Stable. -Recheck CBC at follow-up   8 mm ground-glass nodule in  the left lower lobe.  PCP to arrange outpatient pulmonary follow-up within 1 to 2 weeks of discharge for follow-up and monitoring.   Obesity Body mass index is 32.26 kg/m.     History of Present Illness  07/26/2022  Pulmonary/ 1st office eval/ [name] / [place] [facility] home from NH x one week p above admit  Chief Complaint  Patient presents with   Consult    Consult for O2 dropping normally uses 2LO2 cont.   Dyspnea:  indoor walking room to room  Cough: no am flares/ sporadic daytime min mucoid production Sleep: on side bed is flat 2 pillows  SABA use: hfa 3-4 x daily  02:  2lpm hs and does not titrate daytime Rec Continue trelegy one click each am  - take two good drags then rinse gargle  Only use your albuterol as a rescue medication Ok to try albuterol 15 min before an activity (on alternating days)  that you know would usually make you short of breath Make sure you check your oxygen saturation  AT  your highest level of activity (not after you stop)   to be sure it stays over 90%    10/18/2022  f/u ov/[place] office/[name] re: GOLD  4 maint on 02 24/7 and trelegy   Chief Complaint  Patient presents with   Follow-up    Doing well. Has concerns about a lung nodule mentioned from CT scan in July   Dyspnea:  walking more and planning walking outside, does not understand saba pre challenge or titration of 02 with exertion  Cough: much better  Sleeping: flat bed / 2 pillows  SABA use: hfa rarely / never neb  02: 2lpm hs up to 3lpm with sats 90 on 3lpm  Covid status: vax max  F/ u  Jan 02 2023 CT ordered Rec Also  Ok to try albuterol 15 min before an activity (on alternating days)  that you know would usually make you short of breath  Make sure you check your oxygen saturation  AT  your highest level of activity (not after you stop)   to be sure it stays over 90%  Please schedule a follow up visit in 6 months but call sooner if needed     05/01/2023  f/u ov/[place] office/[name]  re: *** maint on ***  No chief complaint on file.   Dyspnea:  *** Cough: *** Sleeping: *** SABA use: *** 02: *** Covid status: *** Lung cancer screening: ***   No obvious day to day or daytime variability or assoc excess/ purulent sputum or mucus plugs or hemoptysis or cp or chest tightness, subjective wheeze or overt sinus or hb symptoms.   *** without nocturnal  or early am exacerbation  of respiratory  c/o's or need for noct saba. Also denies any obvious fluctuation of symptoms with weather or environmental changes or other aggravating or alleviating factors except as outlined above   No unusual exposure hx or h/o childhood pna/ asthma or knowledge of premature birth.  Current Allergies, Complete Past Medical History, Past Surgical History, Family History, and Social History were reviewed in [employer] record.  ROS  The following are not active complaints unless bolded Hoarseness, sore throat, dysphagia, dental problems, itching, sneezing,  nasal congestion or discharge of excess mucus or purulent secretions, ear ache,   fever, chills, sweats, unintended wt loss or wt gain, classically pleuritic or exertional cp,  orthopnea pnd or arm/hand swelling  or leg swelling, presyncope, palpitations, abdominal pain, anorexia, nausea, vomiting, diarrhea  or change in bowel habits or change in bladder habits, change in stools or change in urine, dysuria, hematuria,  rash, arthralgias, visual complaints, headache, numbness, weakness or ataxia or problems with walking or coordination,  change in mood or  memory.        No outpatient medications have been marked as taking for the 05/01/23 encounter (Appointment) with Nyoka Cowden, MD.             Past Medical History:  Diagnosis Date   Anxiety    Atrophic vaginitis 10/09/2015   Baker's cyst    left leg   Blood in urine 10/09/2015   CHF (congestive heart failure) (HCC)    COPD (chronic obstructive pulmonary disease)  (HCC)    Heart murmur    Hematuria 10/09/2015   Hypertension    Nicotine addiction 04/07/2014   Pneumonia    Urinary urgency 01/25/2016   Vaginal bleeding 10/09/2015      Objective:    05/01/2023          ***  10/18/22 175 lb 12.8 oz (79.7 kg)  08/08/22 170 lb (77.1 kg)  07/26/22 170 lb 3.2 oz (77.2 kg)  Mod bar***            I personally reviewed images and agree with radiology impression as follows:   Chest CTa 12/01/22      1. Chronic inflammatory "mass" in the right lower lobe with associated bronchiectasis and calcifications. This is likely post pneumonic scarring with further contraction. 2. Stable peribronchial thickening and increased interstitial markings and tree-in-bud nodularity throughout the right lung typically seen with chronic inflammation or atypical infection such as MAC. 3. New solid-appearing bilateral upper lobe pulmonary lesions. These are most likely inflammatory or infectious but will require follow-up. Recommend follow-up noncontrast chest CT in 3 months. 4. Other stable stable and new left lung nodules as detailed above. 5. Stable mediastinal and hilar lymph nodes, likely reactive/inflammatory. 6. Stable right breast lesions. Recommend correlation with recent mammograms. 7. Stable advanced atherosclerotic calcifications involving the aorta and branch vessels including the coronary arteries.    Assessment

## 2023-05-01 ENCOUNTER — Ambulatory Visit: Payer: Medicare Other | Admitting: Internal Medicine

## 2023-05-02 ENCOUNTER — Ambulatory Visit: Payer: Medicare Other | Admitting: Internal Medicine

## 2023-05-30 NOTE — Progress Notes (Deleted)
[name], female    DOB: 1951-10-31    MRN: [id number]   Brief patient profile:  12  yowf  quit smoking 2014 and on 02 since 2009  referred to pulmonary clinic in [place]  07/26/2022 by Eboni for copd GOLD 4  / 02 dep  then admit:  Admit date: 06/29/2022 Discharge date: 07/07/2022 Admitted From: Home Disposition: SNF Recommendations for Outpatient Follow-up:  Please obtain BMP and CBC with differential in 1 week Outpatient follow-up with pulmonology about left lower lobe nodule Please follow up on the following pending results: None        Hospital course 72 year old F with PMH of COPD/chronic RF on 2 L, diastolic CHF, HTN, chronic back pain, urinary retention and anxiety presenting with progressive confusion and fever, and admitted for severe sepsis and acute metabolic encephalopathy due to acute pyelonephritis and E. coli bacteremia.  Patient was febrile to 101.7 with leukocytosis to 22 and hypotension to 90/41.  She was lethargic and had an AKI with creatinine of 1.3.  She also had elevated troponin to 902 that has trended up to 1114.  EKG without significant acute ischemic finding.  CT head negative.  CT chest abdomen and pelvis with contrast showed 7 mm LLL nodule, left pyelonephritis and minimal fullness of proximal ureter without obstruction.  Cultures obtained.  Foley catheter placed.  Patient was started on IV fluid and broad-spectrum antibiotics and admitted.  Cardiology consulted.    TTE with LVEF of 55 to 60%, G1 DD and moderate TVR.  Nuclear stress test with normal LV perfusion and no evidence of ischemia but poor quality due to patient's movement.    CTA chest PE protocol on 7/8 with increased clustered ill-defined GG nodularity in the superior segment of LLL and scattered tiny centrilobular nodules in both lungs consistent with infectious or inflammatory bronchiolitis and concern for aspiration pneumonia given the small amount of secretion in trachea.  MRI lumbar spine  without acute finding or osteomyelitis but multilevel spondylosis and moderate bilateral neuroforaminal stenosis at L5-S1.   Patient blood culture grew pansensitive E. coli.  Antibiotic de-escalated to IV Ancef.  She is discharged on p.o. cefadroxil for 6 more days to complete treatment course for E. coli bacteremia and pyelonephritis.    Therapy recommended SNF.   See individual problem list below for more.    Problems addressed during this hospitalization Principal Problem:   Severe sepsis (HCC) Active Problems:   Urinary retention   COPD exacerbation (HCC)   Anxiety   Acute on chronic diastolic CHF (congestive heart failure) (HCC)   Troponin level elevated   Acute pyelonephritis   E coli bacteremia   Acute metabolic encephalopathy   Acute on chronic respiratory failure with hypoxia and hypercapnia (HCC)   Urinary tract infection   Chronic lower back pain   Pulmonary nodule   Obesity (BMI 30-39.9)   Normocytic anemia   Severe sepsis due to acute left pyelonephritis and E. coli bacteremia in the setting of urinary retention, and possible aspiration pneumonia: POA.  Patient had fever, leukocytosis, hypotension, AKI and encephalopathy on presentation.  Blood culture with pansensitive E. coli.  Imaging concerning for left pyelonephritis and left ureteral fullness without obstructing stone.  Imaging also raises concern about left lung pneumonia and possible aspiration. -7/5 Vanco and cefepime>> 7/6 ceftriaxone> 7/10 Ancef> 7/13-7/19 cefadroxil -Doxycycline 7/11-7/15   Acute metabolic encephalopathy: Likely due to the above.  She is also at risk for polypharmacy due to pain medications other sedating  medications.  Resolved. -Reorientation and delirium precautions -Minimize sedating medications   Possible aspiration pneumonia: CTA chest negative for PE but raises concern for LLL pneumonia with concern for aspiration pneumonia given fluid in trachea. -Low risk for aspiration per SLP.    Acute on chronic diastolic CHF: Evidence of fluid overload per exam by previous attending.  Likely in the setting of fluid resuscitation for sepsis.  TTE with normal LVEF, G1 DD and RVSP of 44.  BNP elevated but improved with diuretics.  Diuresed with IV Lasix.  Net -8.5 L.  Appears euvolemic on exam -P.o. Lasix 20 mg daily -Reassess BMP and fluid status at follow-up.   COPD exacerbation: Resolved. -Continue LABA/LAMA/ICS -As needed albuterol -Minimum oxygen to keep saturation above 90%   Acute on chronic hypoxic and hypercarbic respiratory failure/OSA not on CPAP: On 2 L at baseline.  -Minimum oxygen to keep saturation above 90%.  She is high risk for CO2 retention   Acute kidney injury: Resolved. Recent Labs (within last 365 days)             Recent Labs    06/29/22 2057 06/30/22 0451 07/01/22 0207 07/02/22 0122 07/03/22 0221 07/04/22 0049 07/05/22 0231 07/06/22 0301 07/07/22 0035  BUN 26* 25* 30* 23 20 21 19 15 12   CREATININE 1.29* 1.12* 0.94 0.83 0.67 0.91 0.86 0.79 0.64    -Recheck BMP in 1 week   Atypical chest pain/elevated troponin: Troponin elevated to 900 and peaked at 1114.  Demand ischemia?  TTE as above.  Nuclear stress test negative but poor quality.  Chest pain could be due to pneumonia as well.  Symptoms resolved.  Cardiology signed off.  At this point, we can say non-STEMI ruled out. -Continue aspirin and statin.   Essential hypertension: Normotensive off home Hyzaar. -Continue home Cardizem CD -P.o. Lasix 20 mg daily   Urinary retention: History of this.  She had Foley placed in ED. -Continue Flomax and bethanechol -Passed voiding trial.   Chronic low back pain-lumbar MRI without acute finding but spondylolysis with moderate bilateral neuroforaminal stenosis at L5-S1. -Continue therapy at SNF -Continue home Norco but not a great choice with her respiratory issue.   Normocytic anemia: Stable. -Recheck CBC at follow-up   8 mm ground-glass nodule in  the left lower lobe.  PCP to arrange outpatient pulmonary follow-up within 1 to 2 weeks of discharge for follow-up and monitoring.   Obesity Body mass index is 32.26 kg/m.     History of Present Illness  07/26/2022  Pulmonary/ 1st office eval/ Jermaine Tholl / [place] [facility] home from NH x one week p above admit  Chief Complaint  Patient presents with   Consult    Consult for O2 dropping normally uses 2LO2 cont.   Dyspnea:  indoor walking room to room  Cough: no am flares/ sporadic daytime min mucoid production Sleep: on side bed is flat 2 pillows  SABA use: hfa 3-4 x daily  02:  2lpm hs and does not titrate daytime Rec Continue trelegy one click each am  - take two good drags then rinse gargle  Only use your albuterol as a rescue medication Ok to try albuterol 15 min before an activity (on alternating days)  that you know would usually make you short of breath Make sure you check your oxygen saturation  AT  your highest level of activity (not after you stop)   to be sure it stays over 90%    10/18/2022  f/u ov/[place] office/Melvinia Ashby re: GOLD  4 maint on 02 24/7 and trelegy   Chief Complaint  Patient presents with   Follow-up    Doing well. Has concerns about a lung nodule mentioned from CT scan in July   Dyspnea:  walking more and planning walking outside, does not understand saba pre challenge or titration of 02 with exertion  Cough: much better  Sleeping: flat bed / 2 pillows  SABA use: hfa rarely / never neb  02: 2lpm hs up to 3lpm with sats 90 on 3lpm  Covid status: vax max  F/ u  Jan 02 2023 CT ordered Rec Also  Ok to try albuterol 15 min before an activity (on alternating days)  that you know would usually make you short of breath  Make sure you check your oxygen saturation  AT  your highest level of activity (not after you stop)   to be sure it stays over 90%  Please schedule a follow up visit in 6 months but call sooner if needed     05/31/2023  f/u ov/[place] office/Keiley Levey  re: *** maint on ***  No chief complaint on file.   Dyspnea:  *** Cough: *** Sleeping: *** SABA use: *** 02: *** Covid status: *** Lung cancer screening: ***   No obvious day to day or daytime variability or assoc excess/ purulent sputum or mucus plugs or hemoptysis or cp or chest tightness, subjective wheeze or overt sinus or hb symptoms.   *** without nocturnal  or early am exacerbation  of respiratory  c/o's or need for noct saba. Also denies any obvious fluctuation of symptoms with weather or environmental changes or other aggravating or alleviating factors except as outlined above   No unusual exposure hx or h/o childhood pna/ asthma or knowledge of premature birth.  Current Allergies, Complete Past Medical History, Past Surgical History, Family History, and Social History were reviewed in [employer] record.  ROS  The following are not active complaints unless bolded Hoarseness, sore throat, dysphagia, dental problems, itching, sneezing,  nasal congestion or discharge of excess mucus or purulent secretions, ear ache,   fever, chills, sweats, unintended wt loss or wt gain, classically pleuritic or exertional cp,  orthopnea pnd or arm/hand swelling  or leg swelling, presyncope, palpitations, abdominal pain, anorexia, nausea, vomiting, diarrhea  or change in bowel habits or change in bladder habits, change in stools or change in urine, dysuria, hematuria,  rash, arthralgias, visual complaints, headache, numbness, weakness or ataxia or problems with walking or coordination,  change in mood or  memory.        No outpatient medications have been marked as taking for the 05/31/23 encounter (Appointment) with Nyoka Cowden, MD.             Past Medical History:  Diagnosis Date   Anxiety    Atrophic vaginitis 10/09/2015   Baker's cyst    left leg   Blood in urine 10/09/2015   CHF (congestive heart failure) (HCC)    COPD (chronic obstructive pulmonary disease)  (HCC)    Heart murmur    Hematuria 10/09/2015   Hypertension    Nicotine addiction 04/07/2014   Pneumonia    Urinary urgency 01/25/2016   Vaginal bleeding 10/09/2015      Objective:    05/31/2023          ***  10/18/22 175 lb 12.8 oz (79.7 kg)  08/08/22 170 lb (77.1 kg)  07/26/22 170 lb 3.2 oz (77.2 kg)  Mod bar***            I personally reviewed images and agree with radiology impression as follows:   Chest CTa 12/01/22      1. Chronic inflammatory "mass" in the right lower lobe with associated bronchiectasis and calcifications. This is likely post pneumonic scarring with further contraction. 2. Stable peribronchial thickening and increased interstitial markings and tree-in-bud nodularity throughout the right lung typically seen with chronic inflammation or atypical infection such as MAC. 3. New solid-appearing bilateral upper lobe pulmonary lesions. These are most likely inflammatory or infectious but will require follow-up. Recommend follow-up noncontrast chest CT in 3 months. 4. Other stable stable and new left lung nodules as detailed above. 5. Stable mediastinal and hilar lymph nodes, likely reactive/inflammatory. 6. Stable right breast lesions. Recommend correlation with recent mammograms. 7. Stable advanced atherosclerotic calcifications involving the aorta and branch vessels including the coronary arteries.    Assessment

## 2023-05-31 ENCOUNTER — Ambulatory Visit: Payer: Medicare Other | Admitting: Internal Medicine

## 2023-06-08 ENCOUNTER — Telehealth: Payer: Self-pay

## 2023-06-08 DIAGNOSIS — R339 Retention of urine, unspecified: Secondary | ICD-10-CM

## 2023-06-08 MED ORDER — BETHANECHOL CHLORIDE 10 MG PO TABS: 10.0000 mg | ORAL_TABLET | Freq: Two times a day (BID) | ORAL | 3 refills | Status: DC

## 2023-06-08 NOTE — Telephone Encounter (Signed)
Patient is needing a 90 day refill on:  bethanechol (URECHOLINE) 10 MG tablet   Only a few left.  Pharmacy:  [facility] [address] [facility] - [place], [place] - [address] Phone: [phone]  Fax: [phone]

## 2023-06-08 NOTE — Telephone Encounter (Signed)
Left detail message on patient answer machine making patient aware Rx was sent to her pharmacy.

## 2023-06-12 DIAGNOSIS — R911 Solitary pulmonary nodule: Secondary | ICD-10-CM | POA: Diagnosis not present

## 2023-06-12 DIAGNOSIS — R109 Unspecified abdominal pain: Secondary | ICD-10-CM | POA: Diagnosis not present

## 2023-06-12 DIAGNOSIS — K5909 Other constipation: Secondary | ICD-10-CM | POA: Diagnosis not present

## 2023-06-12 DIAGNOSIS — I700 Atherosclerosis of aorta: Secondary | ICD-10-CM | POA: Diagnosis not present

## 2023-06-12 DIAGNOSIS — E559 Vitamin D deficiency, unspecified: Secondary | ICD-10-CM | POA: Diagnosis not present

## 2023-06-12 DIAGNOSIS — J449 Chronic obstructive pulmonary disease, unspecified: Secondary | ICD-10-CM | POA: Diagnosis not present

## 2023-06-12 DIAGNOSIS — R338 Other retention of urine: Secondary | ICD-10-CM | POA: Diagnosis not present

## 2023-06-12 DIAGNOSIS — R7301 Impaired fasting glucose: Secondary | ICD-10-CM | POA: Diagnosis not present

## 2023-06-12 DIAGNOSIS — E785 Hyperlipidemia, unspecified: Secondary | ICD-10-CM | POA: Diagnosis not present

## 2023-06-12 DIAGNOSIS — I10 Essential (primary) hypertension: Secondary | ICD-10-CM | POA: Diagnosis not present

## 2023-06-12 DIAGNOSIS — Z86010 Personal history of colonic polyps: Secondary | ICD-10-CM | POA: Diagnosis not present

## 2023-06-14 DIAGNOSIS — J9611 Chronic respiratory failure with hypoxia: Secondary | ICD-10-CM | POA: Diagnosis not present

## 2023-06-14 DIAGNOSIS — J449 Chronic obstructive pulmonary disease, unspecified: Secondary | ICD-10-CM | POA: Diagnosis not present

## 2023-06-14 DIAGNOSIS — J9621 Acute and chronic respiratory failure with hypoxia: Secondary | ICD-10-CM | POA: Diagnosis not present

## 2023-06-26 ENCOUNTER — Telehealth: Payer: Self-pay | Admitting: *Deleted

## 2023-06-26 NOTE — Telephone Encounter (Signed)
Called and spoke w/ pt she verbalized that she has discussed w/ PCP and he had the same recommendation as MW. Pt states that she is relieved to hear this and will see [place] Friday. NFN att.

## 2023-06-26 NOTE — Telephone Encounter (Signed)
As long as BP ok and not light headed standing this is not an emergent issue but is also not a pulmonary problem.  I don't see any obvious med on her chart that would cause this but strongly rec she see Dr Scharlene Gloss office asap.

## 2023-06-26 NOTE — Telephone Encounter (Signed)
Patient states that her heart rate has been in the 40s and wanted to know if there was anything she can do about it. Patient has ROV for Friday July 5th with MW.   Please call and advise patient. [phone]

## 2023-06-26 NOTE — Telephone Encounter (Signed)
Called and spoke w/ pt she verbalized that her HR has been in the upper 30's - 40's and she is concerned because this has been ongoing for longer than 1 week. She wanted to know if there are any recommendations from Dr.Wert on what to do.

## 2023-06-30 ENCOUNTER — Ambulatory Visit (INDEPENDENT_AMBULATORY_CARE_PROVIDER_SITE_OTHER): Payer: Medicare Other | Admitting: Internal Medicine

## 2023-06-30 ENCOUNTER — Other Ambulatory Visit: Payer: Self-pay

## 2023-06-30 ENCOUNTER — Encounter: Payer: Self-pay | Admitting: Internal Medicine

## 2023-06-30 ENCOUNTER — Ambulatory Visit (HOSPITAL_COMMUNITY)
Admission: RE | Admit: 2023-06-30 | Discharge: 2023-06-30 | Disposition: A | Discharge status: Home / Self Care | Payer: Medicare Other | Source: Ambulatory Visit | Attending: Internal Medicine | Admitting: Internal Medicine

## 2023-06-30 VITALS — BP 119/71 | HR 98 | Ht 63.0 in | Wt 180.0 lb

## 2023-06-30 DIAGNOSIS — J449 Chronic obstructive pulmonary disease, unspecified: Secondary | ICD-10-CM

## 2023-06-30 DIAGNOSIS — J9621 Acute and chronic respiratory failure with hypoxia: Secondary | ICD-10-CM

## 2023-06-30 DIAGNOSIS — J9622 Acute and chronic respiratory failure with hypercapnia: Secondary | ICD-10-CM | POA: Diagnosis not present

## 2023-06-30 DIAGNOSIS — R10A1 Flank pain, right side: Secondary | ICD-10-CM

## 2023-06-30 DIAGNOSIS — K5730 Diverticulosis of large intestine without perforation or abscess without bleeding: Secondary | ICD-10-CM | POA: Diagnosis not present

## 2023-06-30 DIAGNOSIS — R911 Solitary pulmonary nodule: Secondary | ICD-10-CM

## 2023-06-30 DIAGNOSIS — J479 Bronchiectasis, uncomplicated: Secondary | ICD-10-CM | POA: Diagnosis not present

## 2023-06-30 DIAGNOSIS — K760 Fatty (change of) liver, not elsewhere classified: Secondary | ICD-10-CM | POA: Diagnosis not present

## 2023-06-30 DIAGNOSIS — R109 Unspecified abdominal pain: Secondary | ICD-10-CM

## 2023-06-30 DIAGNOSIS — R590 Localized enlarged lymph nodes: Secondary | ICD-10-CM | POA: Diagnosis not present

## 2023-06-30 DIAGNOSIS — R918 Other nonspecific abnormal finding of lung field: Secondary | ICD-10-CM | POA: Diagnosis not present

## 2023-06-30 DIAGNOSIS — J929 Pleural plaque without asbestos: Secondary | ICD-10-CM | POA: Diagnosis not present

## 2023-06-30 MED ORDER — IOHEXOL 300 MG/ML  SOLN
100.0000 mL | Freq: Once | INTRAMUSCULAR | Status: AC | PRN
  Administered 2023-06-30: 100 mL via INTRAVENOUS

## 2023-06-30 NOTE — Assessment & Plan Note (Signed)
Quit smoking 2014  - PFT's  08/19/16  FEV1 0.54 (22 % ) ratio 0.43  p 1 % improvement from saba p Incruse prior to study with DLCO  10.53  (49%) and  FV curve classically concave      Group D (now reclassified as E) in terms of symptom/risk and laba/lama/ICS  therefore appropriate rx at this point >>>  trelegy 100 and more approp saba rx  Re SABA :  I spent extra time with pt today reviewing appropriate use of albuterol for prn use on exertion with the following points: 1) saba is for relief of sob that does not improve by walking a slower pace or resting but rather if the pt does not improve after trying this first. 2) If the pt is convinced, as many are, that saba helps recover from activity faster then it's easy to tell if this is the case by re-challenging : ie stop, take the inhaler, then p 5 minutes try the exact same activity (intensity of workload) that just caused the symptoms and see if they are substantially diminished or not after saba 3) if there is an activity that reproducibly causes the symptoms, try the saba 15 min before the activity on alternate days   If in fact the saba really does help, then fine to continue to use it prn but advised may need to look closer at the maintenance regimen (for now trelegy)  being used to achieve better control of airways disease with exertion.

## 2023-06-30 NOTE — Assessment & Plan Note (Signed)
HC03 baseline around 44 as of admit 06/2022  - 07/26/2022 patient walked at a slow pace on 3LO2 cont patient had to stop after 150 ft  due to SOB and being tired. Walked patient to her car after visit and on 3LO2 pulse, her O2 was at 67% on personal pulse ox but ours was low 90s  - 10/18/2022   Walked on 3lpm POC  x  1  lap(s) =  approx 150  ft  @ slow pace, stopped due to tired> sob  with lowest 02 sats  92%  - 06/30/2023   Walked on 4lpm cont  x  1  lap(s) =  approx 150  ft  @ moderately fast pace, stopped due to tired before sob  with lowest 02 sats 89% so referred to [facility] for best fit for Portable with goal > 90% walking    Advised on goal of keepings sats > 90% at all times

## 2023-06-30 NOTE — Patient Instructions (Addendum)
Keep appt for your CT chest and abdomen at [facility] and I will call you with results  Make sure you check your oxygen saturation  AT  your highest level of activity (not after you stop)   to be sure it stays over 90% and adjust  02 flow upward to maintain this level if needed but remember to turn it back to previous settings when you stop (to conserve your supply).   Similarly:   Ok to try albuterol x 2 puffs x 15 min before an activity (on alternating days)  that you know would usually make you short of breath and see if it makes any difference and if makes none then don't take albuterol after activity unless you can't catch your breath as this means it's the resting that helps, not the albuterol.  My office will be contacting you by phone for referral to [employer] for best fit for ambulatory 0xygen  - if you don't hear back from my office within one week please call us back or notify us thru MyChart and we'll address it right away.      Please schedule a follow up office visit in 6 weeks, call sooner if needed with all medications /inhalers/ solutions in hand so we can verify exactly what you are taking. This includes all medications from all doctors and over the counters

## 2023-06-30 NOTE — Assessment & Plan Note (Signed)
See CT chest 07/02/22  > repeat CT  12/01/22 likely all inflammatory  - new R flank pain reported since 11/2022 > 06/30/2023 CT abd/chest >>>  Discussed in detail all the  indications, usual  risks and alternatives  relative to the benefits with patient who agrees to proceed with w/u as outlined.     Each maintenance medication was reviewed in detail including emphasizing most importantly the difference between maintenance and prns and under what circumstances the prns are to be triggered using an action plan format where appropriate.  Total time for H and P, chart review, counseling, reviewing hfa/dpi/02 device(s) , directly observing portions of ambulatory 02 saturation study/ and generating customized AVS unique to this office visit / same day charting >40 min for multiple  refractory worsening chest/respiratory  symptoms of uncertain etiology

## 2023-06-30 NOTE — Progress Notes (Signed)
[name], female    DOB: October 08, 1951    MRN: [id number]   Brief patient profile:  38  yowf  quit smoking 2014 and on 02 since 2009  referred to pulmonary clinic in [place]  07/26/2022 by Eboni for copd GOLD 4  / 02 dep  then admit:  Admit date: 06/29/2022 Discharge date: 07/07/2022 Admitted From: Home Disposition: SNF Recommendations for Outpatient Follow-up:  Please obtain BMP and CBC with differential in 1 week Outpatient follow-up with pulmonology about left lower lobe nodule Please follow up on the following pending results: None        Hospital course 72 year old F with PMH of COPD/chronic RF on 2 L, diastolic CHF, HTN, chronic back pain, urinary retention and anxiety presenting with progressive confusion and fever, and admitted for severe sepsis and acute metabolic encephalopathy due to acute pyelonephritis and E. coli bacteremia.  Patient was febrile to 101.7 with leukocytosis to 22 and hypotension to 90/41.  She was lethargic and had an AKI with creatinine of 1.3.  She also had elevated troponin to 902 that has trended up to 1114.  EKG without significant acute ischemic finding.  CT head negative.  CT chest abdomen and pelvis with contrast showed 7 mm LLL nodule, left pyelonephritis and minimal fullness of proximal ureter without obstruction.  Cultures obtained.  Foley catheter placed.  Patient was started on IV fluid and broad-spectrum antibiotics and admitted.  Cardiology consulted.    TTE with LVEF of 55 to 60%, G1 DD and moderate TVR.  Nuclear stress test with normal LV perfusion and no evidence of ischemia but poor quality due to patient's movement.    CTA chest PE protocol on 7/8 with increased clustered ill-defined GG nodularity in the superior segment of LLL and scattered tiny centrilobular nodules in both lungs consistent with infectious or inflammatory bronchiolitis and concern for aspiration pneumonia given the small amount of secretion in trachea.  MRI lumbar spine  without acute finding or osteomyelitis but multilevel spondylosis and moderate bilateral neuroforaminal stenosis at L5-S1.   Patient blood culture grew pansensitive E. coli.  Antibiotic de-escalated to IV Ancef.  She is discharged on p.o. cefadroxil for 6 more days to complete treatment course for E. coli bacteremia and pyelonephritis.    Therapy recommended SNF.   See individual problem list below for more.    Problems addressed during this hospitalization Principal Problem:   Severe sepsis (HCC) Active Problems:   Urinary retention   COPD exacerbation (HCC)   Anxiety   Acute on chronic diastolic CHF (congestive heart failure) (HCC)   Troponin level elevated   Acute pyelonephritis   E coli bacteremia   Acute metabolic encephalopathy   Acute on chronic respiratory failure with hypoxia and hypercapnia (HCC)   Urinary tract infection   Chronic lower back pain   Pulmonary nodule   Obesity (BMI 30-39.9)   Normocytic anemia   Severe sepsis due to acute left pyelonephritis and E. coli bacteremia in the setting of urinary retention, and possible aspiration pneumonia: POA.  Patient had fever, leukocytosis, hypotension, AKI and encephalopathy on presentation.  Blood culture with pansensitive E. coli.  Imaging concerning for left pyelonephritis and left ureteral fullness without obstructing stone.  Imaging also raises concern about left lung pneumonia and possible aspiration. -7/5 Vanco and cefepime>> 7/6 ceftriaxone> 7/10 Ancef> 7/13-7/19 cefadroxil -Doxycycline 7/11-7/15   Acute metabolic encephalopathy: Likely due to the above.  She is also at risk for polypharmacy due to pain medications other sedating  medications.  Resolved. -Reorientation and delirium precautions -Minimize sedating medications   Possible aspiration pneumonia: CTA chest negative for PE but raises concern for LLL pneumonia with concern for aspiration pneumonia given fluid in trachea. -Low risk for aspiration per SLP.    Acute on chronic diastolic CHF: Evidence of fluid overload per exam by previous attending.  Likely in the setting of fluid resuscitation for sepsis.  TTE with normal LVEF, G1 DD and RVSP of 44.  BNP elevated but improved with diuretics.  Diuresed with IV Lasix.  Net -8.5 L.  Appears euvolemic on exam -P.o. Lasix 20 mg daily -Reassess BMP and fluid status at follow-up.   COPD exacerbation: Resolved. -Continue LABA/LAMA/ICS -As needed albuterol -Minimum oxygen to keep saturation above 90%   Acute on chronic hypoxic and hypercarbic respiratory failure/OSA not on CPAP: On 2 L at baseline.  -Minimum oxygen to keep saturation above 90%.  She is high risk for CO2 retention   Acute kidney injury: Resolved. Recent Labs (within last 365 days)             Recent Labs    06/29/22 2057 06/30/22 0451 07/01/22 0207 07/02/22 0122 07/03/22 0221 07/04/22 0049 07/05/22 0231 07/06/22 0301 07/07/22 0035  BUN 26* 25* 30* 23 20 21 19 15 12   CREATININE 1.29* 1.12* 0.94 0.83 0.67 0.91 0.86 0.79 0.64    -Recheck BMP in 1 week   Atypical chest pain/elevated troponin: Troponin elevated to 900 and peaked at 1114.  Demand ischemia?  TTE as above.  Nuclear stress test negative but poor quality.  Chest pain could be due to pneumonia as well.  Symptoms resolved.  Cardiology signed off.  At this point, we can say non-STEMI ruled out. -Continue aspirin and statin.   Essential hypertension: Normotensive off home Hyzaar. -Continue home Cardizem CD -P.o. Lasix 20 mg daily   Urinary retention: History of this.  She had Foley placed in ED. -Continue Flomax and bethanechol -Passed voiding trial.   Chronic low back pain-lumbar MRI without acute finding but spondylolysis with moderate bilateral neuroforaminal stenosis at L5-S1. -Continue therapy at SNF -Continue home Norco but not a great choice with her respiratory issue.   Normocytic anemia: Stable. -Recheck CBC at follow-up   8 mm ground-glass nodule in  the left lower lobe.  PCP to arrange outpatient pulmonary follow-up within 1 to 2 weeks of discharge for follow-up and monitoring.   Obesity Body mass index is 32.26 kg/m.     History of Present Illness  07/26/2022  Pulmonary/ 1st office eval/ [name] / [place] [facility] home from NH x one week p above admit  Chief Complaint  Patient presents with   Consult    Consult for O2 dropping normally uses 2LO2 cont.   Dyspnea:  indoor walking room to room  Cough: no am flares/ sporadic daytime min mucoid production Sleep: on side bed is flat 2 pillows  SABA use: hfa 3-4 x daily  02:  2lpm hs and does not titrate daytime Rec Continue trelegy one click each am  - take two good drags then rinse gargle  Only use your albuterol as a rescue medication Ok to try albuterol 15 min before an activity (on alternating days)  that you know would usually make you short of breath Make sure you check your oxygen saturation  AT  your highest level of activity (not after you stop)   to be sure it stays over 90%    10/18/2022  f/u ov/[place] office/[name] re: GOLD  4 maint on 02 24/7 and trelegy   Chief Complaint  Patient presents with   Follow-up    Doing well. Has concerns about a lung nodule mentioned from CT scan in July   Dyspnea:  walking more and planning walking outside, does not understand saba pre challenge or titration of 02 with exertion  Cough: much better  Sleeping: flat bed / 2 pillows  SABA use: hfa rarely / never neb  02: 2lpm hs up to 3lpm with sats 90 on 3lpm  Covid status: vax max  Rec Also  Ok to try albuterol 15 min before an activity (on alternating days)  that you know would usually make you short of breath  Make sure you check your oxygen saturation  AT  your highest level of activity (not after you stop)   to be sure it stays over 90%    06/30/2023  f/u ov/[place] office/[name] re: GOLD 4/ 02 dep copd  maint on trelegy  [place] x months / some better p abx from Dr Margo Aye for what  she says was a virus but no ongoing fever, chills but new R flank pain "x months" no pleuritic quality  Chief Complaint  Patient presents with   Follow-up  Dyspnea:  room to room at home 3lpm continuous Cough:  min rattle > clear mucus only  Sleeping: flat bed  2 pillows s resp cc  SABA use: 3 x daily only increases  after 02: 3lpm 24/7   No obvious day to day or daytime variability or assoc excess/ purulent sputum or mucus plugs or hemoptysis or cp or chest tightness, subjective wheeze or overt sinus or hb symptoms.   sleeping without nocturnal  or early am exacerbation  of respiratory  c/o's or need for noct saba. Also denies any obvious fluctuation of symptoms with weather or environmental changes or other aggravating or alleviating factors except as outlined above   No unusual exposure hx or h/o childhood pna/ asthma or knowledge of premature birth.  Current Allergies, Complete Past Medical History, Past Surgical History, Family History, and Social History were reviewed in [employer] record.  ROS  The following are not active complaints unless bolded Hoarseness, sore throat, dysphagia, dental problems, itching, sneezing,  nasal congestion or discharge of excess mucus or purulent secretions, ear ache,   fever, chills, sweats, unintended wt loss or wt gain, classically pleuritic or exertional cp,  orthopnea pnd or arm/hand swelling  or leg swelling, presyncope, palpitations, abdominal pain, anorexia, nausea, vomiting, diarrhea  or change in bowel habits or change in bladder habits, change in stools or change in urine, dysuria, hematuria,  rash, arthralgias, visual complaints, headache, numbness, weakness or ataxia or problems with walking or coordination,  change in mood or  memory.        Current Meds  Medication Sig   albuterol (VENTOLIN HFA) 108 (90 Base) MCG/ACT inhaler Inhale 2 puffs into the lungs every 6 (six) hours as needed for wheezing or shortness of  breath. For shortness of breath   aspirin EC 81 MG tablet Take 81 mg by mouth in the morning. Swallow whole.   atorvastatin (LIPITOR) 80 MG tablet Take 1 tablet (80 mg total) by mouth daily.   bethanechol (URECHOLINE) 10 MG tablet Take 1 tablet (10 mg total) by mouth in the morning and at bedtime.   Cyanocobalamin (VITAMIN B-12 CR) 1000 MCG TBCR Take 1 tablet by mouth daily.   diltiazem (CARDIZEM CD) 180 MG 24 hr capsule  Take 1 capsule (180 mg total) by mouth daily.   furosemide (LASIX) 20 MG tablet Take 1 tablet (20 mg total) by mouth daily.   HYDROcodone-acetaminophen (NORCO) 10-325 MG tablet Take 1 tablet by mouth 4 (four) times daily as needed.   losartan (COZAAR) 50 MG tablet Take 50 mg by mouth daily.   melatonin 5 MG TABS Take 1 tablet (5 mg total) by mouth once as needed.   Multiple Vitamin (MULTIVITAMIN WITH MINERALS) TABS tablet Take 1 tablet by mouth in the morning.   pantoprazole (PROTONIX) 20 MG tablet Take 1 tablet (20 mg total) by mouth daily.   polyethylene glycol (MIRALAX / GLYCOLAX) 17 g packet Take 17 g by mouth daily as needed. Sometimes forgets to take everyday.   senna-docusate (SENOKOT-S) 8.6-50 MG tablet Take 1 tablet by mouth at bedtime.   tamsulosin (FLOMAX) 0.4 MG CAPS capsule Take 1 capsule (0.4 mg total) by mouth daily after supper.   TRELEGY ELLIPTA 100-62.5-25 MCG/INH AEPB Take 1 puff by mouth in the morning.   vitamin B-12 1000 MCG tablet Take 1 tablet (1,000 mcg total) by mouth daily.             Past Medical History:  Diagnosis Date   Anxiety    Atrophic vaginitis 10/09/2015   Baker's cyst    left leg   Blood in urine 10/09/2015   CHF (congestive heart failure) (HCC)    COPD (chronic obstructive pulmonary disease) (HCC)    Heart murmur    Hematuria 10/09/2015   Hypertension    Nicotine addiction 04/07/2014   Pneumonia    Urinary urgency 01/25/2016   Vaginal bleeding 10/09/2015      Objective:    06/30/2023          180  10/18/22 175 lb 12.8 oz  (79.7 kg)  08/08/22 170 lb (77.1 kg)  07/26/22 170 lb 3.2 oz (77.2 kg)    Vital signs reviewed  06/30/2023  - Note at rest 02 sats  91% on 4lpm cont    General appearance:    frustrated w/c bound elderly wf nad at rest    HEENT :  Oropharynx  clear     NECK :  without JVD/Nodes/TM/ nl carotid upstrokes bilaterally   LUNGS: no acc muscle use,  Mod barrel  contour chest wall with bilateral  Distant bs s audible wheeze and  without cough on insp or exp maneuvers and mod  Hyperresonant  to  percussion bilaterally     CV:  RRR  no s3 or murmur or increase in P2, and no edema   ABD:  soft and nontender    MS:   Ext warm without deformities or   obvious joint restrictions , calf tenderness, cyanosis or clubbing  SKIN: warm and dry without lesions    NEURO:  alert, approp, nl sensorium with  no motor or cerebellar deficits apparent.             Assessment

## 2023-07-07 ENCOUNTER — Emergency Department (HOSPITAL_COMMUNITY): Payer: Medicare Other

## 2023-07-07 ENCOUNTER — Other Ambulatory Visit: Payer: Self-pay

## 2023-07-07 ENCOUNTER — Encounter (HOSPITAL_COMMUNITY): Payer: Self-pay | Admitting: *Deleted

## 2023-07-07 ENCOUNTER — Emergency Department (HOSPITAL_COMMUNITY)
Admission: EM | Admit: 2023-07-07 | Discharge: 2023-07-07 | Disposition: A | Discharge status: Home / Self Care | Payer: Medicare Other | Attending: Emergency Medicine | Admitting: Emergency Medicine

## 2023-07-07 DIAGNOSIS — M79662 Pain in left lower leg: Secondary | ICD-10-CM | POA: Insufficient documentation

## 2023-07-07 DIAGNOSIS — M1712 Unilateral primary osteoarthritis, left knee: Secondary | ICD-10-CM | POA: Diagnosis not present

## 2023-07-07 DIAGNOSIS — Z79899 Other long term (current) drug therapy: Secondary | ICD-10-CM | POA: Insufficient documentation

## 2023-07-07 DIAGNOSIS — M79652 Pain in left thigh: Secondary | ICD-10-CM | POA: Diagnosis not present

## 2023-07-07 DIAGNOSIS — M79605 Pain in left leg: Secondary | ICD-10-CM

## 2023-07-07 DIAGNOSIS — I110 Hypertensive heart disease with heart failure: Secondary | ICD-10-CM | POA: Insufficient documentation

## 2023-07-07 DIAGNOSIS — I509 Heart failure, unspecified: Secondary | ICD-10-CM | POA: Insufficient documentation

## 2023-07-07 DIAGNOSIS — G5712 Meralgia paresthetica, left lower limb: Secondary | ICD-10-CM | POA: Insufficient documentation

## 2023-07-07 DIAGNOSIS — Z7982 Long term (current) use of aspirin: Secondary | ICD-10-CM | POA: Diagnosis not present

## 2023-07-07 DIAGNOSIS — J449 Chronic obstructive pulmonary disease, unspecified: Secondary | ICD-10-CM | POA: Diagnosis not present

## 2023-07-07 MED ORDER — ACETAMINOPHEN 500 MG PO TABS
1000.0000 mg | ORAL_TABLET | Freq: Once | ORAL | Status: AC
  Administered 2023-07-07: 1000 mg via ORAL
  Filled 2023-07-07: qty 2

## 2023-07-07 MED ORDER — LIDOCAINE 5 % EX PTCH
2.0000 | MEDICATED_PATCH | CUTANEOUS | Status: DC
  Administered 2023-07-07: 2 via TRANSDERMAL
  Filled 2023-07-07: qty 2

## 2023-07-07 MED ORDER — GABAPENTIN 300 MG PO CAPS: ORAL_CAPSULE | ORAL | 0 refills | Status: DC

## 2023-07-07 NOTE — ED Triage Notes (Signed)
Pt sent from doctor's office for possible blood clot, pt states left upper leg is warm to touch.

## 2023-07-07 NOTE — Discharge Instructions (Addendum)
You were seen for your leg pain in the emergency department.   At home, please start taking the gabapentin.  You will take an increased dose every day until the third day.  Please wear loose clothing in case you have a pinched nerve.  Check your MyChart online for the results of any tests that had not resulted by the time you left the emergency department.   Follow-up with your primary doctor in 2-3 days regarding your visit.    Return immediately to the emergency department if you experience any of the following: Bowel or bladder incontinence, fevers, or any other concerning symptoms.    Thank you for visiting our Emergency Department. It was a pleasure taking care of you today.

## 2023-07-07 NOTE — ED Provider Notes (Signed)
[place] EMERGENCY DEPARTMENT AT [facility] Provider Note   CSN: [id number] Arrival date & time: 07/07/23  1235     History  Chief Complaint  Patient presents with   Leg Pain    LACRESHIA MORADI is a 72 y.o. female.  72 year old female with a history of COPD on 3 L oxygen, CHF, HTN, chronic back pain who presents to the emergency department with left lower extremity pain.  Patient reports that recently she has developed a burning sensation on the anterior portion of her left thigh.  Says that it feels warm to her but there is no erythema.  No fevers.  No lower extremity swelling otherwise.  Does have chronic back pain but does not believe her back pain is any worse than usual.  No lower extremity weakness or numbness, saddle anesthesia, or bowel or bladder incontinence.  Dr. Valera Castle doctor about it who referred her into the emergency department for evaluation of a DVT.  She does not have a history of DVT or PE and denies any chest pain or shortness of breath at this time.  Has only tried a Vicodin for the pain.  Has not been wearing tight fitting clothing       Home Medications Prior to Admission medications   Medication Sig Start Date End Date Taking? Authorizing Provider  gabapentin (NEURONTIN) 300 MG capsule Take 1 capsule (300 mg total) by mouth daily for 1 day, THEN 1 capsule (300 mg total) 2 (two) times daily for 1 day, THEN 1 capsule (300 mg total) 3 (three) times daily. 07/07/23 08/08/23 Yes Rondel Baton, MD  albuterol (VENTOLIN HFA) 108 (90 Base) MCG/ACT inhaler Inhale 2 puffs into the lungs every 6 (six) hours as needed for wheezing or shortness of breath. For shortness of breath 12/27/22   Nyoka Cowden, MD  aspirin EC 81 MG tablet Take 81 mg by mouth in the morning. Swallow whole.    [provider]  atorvastatin (LIPITOR) 80 MG tablet Take 1 tablet (80 mg total) by mouth daily. 07/07/22   Almon Hercules, MD  bethanechol (URECHOLINE) 10 MG tablet Take  1 tablet (10 mg total) by mouth in the morning and at bedtime. 06/08/23   McKenzie, Mardene Celeste, MD  Cyanocobalamin (VITAMIN B-12 CR) 1000 MCG TBCR Take 1 tablet by mouth daily. 07/07/22   [provider]  diltiazem (CARDIZEM CD) 180 MG 24 hr capsule Take 1 capsule (180 mg total) by mouth daily. 07/08/22   Almon Hercules, MD  furosemide (LASIX) 20 MG tablet Take 1 tablet (20 mg total) by mouth daily. 07/08/22   Almon Hercules, MD  HYDROcodone-acetaminophen (NORCO) 10-325 MG tablet Take 1 tablet by mouth 4 (four) times daily as needed. 07/14/22   [provider]  losartan (COZAAR) 50 MG tablet Take 50 mg by mouth daily. 07/22/22   [provider]  melatonin 5 MG TABS Take 1 tablet (5 mg total) by mouth once as needed. 07/07/22   Almon Hercules, MD  Multiple Vitamin (MULTIVITAMIN WITH MINERALS) TABS tablet Take 1 tablet by mouth in the morning.    [provider]  pantoprazole (PROTONIX) 20 MG tablet Take 1 tablet (20 mg total) by mouth daily. 07/08/22   Almon Hercules, MD  polyethylene glycol (MIRALAX / GLYCOLAX) 17 g packet Take 17 g by mouth daily as needed. Sometimes forgets to take everyday.    [provider]  senna-docusate (SENOKOT-S) 8.6-50 MG tablet Take 1 tablet  by mouth at bedtime. 07/07/22   Almon Hercules, MD  tamsulosin (FLOMAX) 0.4 MG CAPS capsule Take 1 capsule (0.4 mg total) by mouth daily after supper. 01/17/23   McKenzie, Mardene Celeste, MD  TRELEGY ELLIPTA 100-62.5-25 MCG/INH AEPB Take 1 puff by mouth in the morning. 02/19/21   [provider]  vitamin B-12 1000 MCG tablet Take 1 tablet (1,000 mcg total) by mouth daily. 06/19/21   Lewie Chamber, MD      Allergies    Patient has no known allergies.    Review of Systems   Review of Systems  Physical Exam Updated Vital Signs BP (!) 148/61 (BP Location: Left Arm)   Pulse 87   Temp 98.1 F (36.7 C) (Oral)   Resp 20   Ht 5\' 3"  (1.6 m)   Wt 79.4 kg   SpO2 (!) 89%   BMI 31.00 kg/m  Physical  Exam Vitals and nursing note reviewed.  Constitutional:      General: She is not in acute distress.    Appearance: She is well-developed.  HENT:     Head: Normocephalic and atraumatic.     Right Ear: External ear normal.     Left Ear: External ear normal.     Nose: Nose normal.  Eyes:     Extraocular Movements: Extraocular movements intact.     Conjunctiva/sclera: Conjunctivae normal.     Pupils: Pupils are equal, round, and reactive to light.  Pulmonary:     Effort: Pulmonary effort is normal. No respiratory distress.     Comments: On 3 L nasal cannula which is her baseline Musculoskeletal:     Cervical back: Normal range of motion and neck supple.     Right lower leg: No edema.     Left lower leg: No edema.     Comments: Motor: Muscle bulk and tone are normal. Strength is 5/5 in hip flexion, knee flexion and extension, ankle dorsiflexion and plantar flexion bilaterally. Full strength of great toe dorsiflexion bilaterally.  Sensory: Intact sensation to light touch in L2 though S1 dermatomes bilaterally.   DP pulses 2+ bilaterally.  No apparent swelling of the thigh.  No erythema or warmth noted.  Skin:    General: Skin is warm and dry.  Neurological:     Mental Status: She is alert and oriented to person, place, and time. Mental status is at baseline.  Psychiatric:        Mood and Affect: Mood normal.     ED Results / Procedures / Treatments   Labs (all labs ordered are listed, but only abnormal results are displayed) Labs Reviewed - No data to display  EKG None  Radiology DG FEMUR MIN 2 VIEWS LEFT  Result Date: 07/07/2023 CLINICAL DATA:  Leg pain, warmth EXAM: LEFT FEMUR 2 VIEWS COMPARISON:  None Available. FINDINGS: There is no evidence of fracture or other focal bone lesions. Hip joint appears within normal limits. Tricompartmental osteoarthritis of the left knee. Soft tissues are unremarkable. IMPRESSION: 1. No acute fracture or dislocation of the left femur. 2.  Tricompartmental osteoarthritis of the left knee. Electronically Signed   By: Duanne Guess D.O.   On: 07/07/2023 15:01   US Venous Img Lower Unilateral Left  Result Date: 07/07/2023 CLINICAL DATA:  Left lateral mid thigh pain and burning for 2 days EXAM: LEFT LOWER EXTREMITY VENOUS DOPPLER ULTRASOUND TECHNIQUE: Gray-scale sonography with compression, as well as color and duplex ultrasound, were performed to evaluate the deep venous system(s) from the  level of the common femoral vein through the popliteal and proximal calf veins. COMPARISON:  None available FINDINGS: VENOUS Normal compressibility of the common femoral, superficial femoral, and popliteal veins, as well as the visualized calf veins. Visualized portions of profunda femoral vein and great saphenous vein unremarkable. No filling defects to suggest DVT on grayscale or color Doppler imaging. Doppler waveforms show normal direction of venous flow, normal respiratory plasticity and response to augmentation. Limited views of the contralateral common femoral vein are unremarkable. OTHER No discrete sonographic abnormality of the area of pain in the left lateral hip. Limitations: none IMPRESSION: No DVT of the left lower extremity. Electronically Signed   By: Acquanetta Belling M.D.   On: 07/07/2023 14:28    Procedures Procedures    Medications Ordered in ED Medications  lidocaine (LIDODERM) 5 % 2 patch (2 patches Transdermal Patch Applied 07/07/23 1413)  acetaminophen (TYLENOL) tablet 1,000 mg (1,000 mg Oral Given 07/07/23 1413)    ED Course/ Medical Decision Making/ A&P                             Medical Decision Making Amount and/or Complexity of Data Reviewed Radiology: ordered.  Risk OTC drugs. Prescription drug management.   DESTINEY DADAMO is a 72 y.o. female with comorbidities that complicate the patient evaluation including COPD on 3 L oxygen, CHF, HTN, chronic back pain who presents to the emergency department with left lower  extremity pain.   Initial Ddx:  Neuropathic pain, meralgia paresthetica, radiculopathy, infection, DVT, fracture/malignancy  MDM/Course:  Patient presents with atraumatic burning of her left thigh.  On exam does not have any significant swelling of the left lower extremity.  No erythema or warmth or other signs of infection.  Neurologic exam is otherwise normal in her lower extremities and she has good pulses.  Patient's primary doctor was concerned about a DVT and ultrasound today does not show any evidence of DVT or other abnormalities.  Did had an x-ray of her left femur as well which does not show any evidence of malignancy or fracture.  Upon re-evaluation patient was unchanged.  Suspect that she may have meralgia paresthetica or radiculopathy that is causing her pain.  Does wear yoga pants frequently.  Was counseled to wear looser clothing for several days to weeks until her symptoms improve no signs of spinal cord compression at this time that would necessitate MRI.  Will start the patient on gabapentin and have her follow-up with her primary doctor in several days.  This patient presents to the ED for concern of complaints listed in HPI, this involves an extensive number of treatment options, and is a complaint that carries with it a high risk of complications and morbidity. Disposition including potential need for admission considered.   Dispo: DC Home. Return precautions discussed including, but not limited to, those listed in the AVS. Allowed pt time to ask questions which were answered fully prior to dc.  Additional history obtained from family Records reviewed Outpatient Clinic Notes I independently reviewed the following imaging with scope of interpretation limited to determining acute life threatening conditions related to emergency care: Extremity x-ray(s) and agree with the radiologist interpretation with the following exceptions: none I have reviewed the patients home medications and  made adjustments as needed Social Determinants of health:  Elderly         Final Clinical Impression(s) / ED Diagnoses Final diagnoses:  Left leg pain  Meralgia  paresthetica of left side    Rx / DC Orders ED Discharge Orders          Ordered    gabapentin (NEURONTIN) 300 MG capsule  Multiple Frequencies        07/07/23 1543              Rondel Baton, MD 07/07/23 [phone]

## 2023-07-11 ENCOUNTER — Emergency Department (HOSPITAL_COMMUNITY)
Admission: EM | Admit: 2023-07-11 | Discharge: 2023-07-11 | Disposition: A | Discharge status: Home / Self Care | Payer: Medicare Other | Source: Home / Self Care | Attending: Emergency Medicine | Admitting: Emergency Medicine

## 2023-07-11 ENCOUNTER — Emergency Department (HOSPITAL_COMMUNITY): Payer: Medicare Other

## 2023-07-11 ENCOUNTER — Other Ambulatory Visit: Payer: Self-pay

## 2023-07-11 ENCOUNTER — Encounter (HOSPITAL_COMMUNITY): Payer: Self-pay

## 2023-07-11 DIAGNOSIS — B9620 Unspecified Escherichia coli [E. coli] as the cause of diseases classified elsewhere: Secondary | ICD-10-CM | POA: Diagnosis present

## 2023-07-11 DIAGNOSIS — J449 Chronic obstructive pulmonary disease, unspecified: Secondary | ICD-10-CM | POA: Diagnosis not present

## 2023-07-11 DIAGNOSIS — Z1611 Resistance to penicillins: Secondary | ICD-10-CM | POA: Diagnosis not present

## 2023-07-11 DIAGNOSIS — Z79899 Other long term (current) drug therapy: Secondary | ICD-10-CM | POA: Insufficient documentation

## 2023-07-11 DIAGNOSIS — Z8249 Family history of ischemic heart disease and other diseases of the circulatory system: Secondary | ICD-10-CM | POA: Diagnosis not present

## 2023-07-11 DIAGNOSIS — R58 Hemorrhage, not elsewhere classified: Secondary | ICD-10-CM | POA: Diagnosis not present

## 2023-07-11 DIAGNOSIS — S0990XA Unspecified injury of head, initial encounter: Secondary | ICD-10-CM | POA: Diagnosis not present

## 2023-07-11 DIAGNOSIS — Z7951 Long term (current) use of inhaled steroids: Secondary | ICD-10-CM | POA: Insufficient documentation

## 2023-07-11 DIAGNOSIS — N390 Urinary tract infection, site not specified: Secondary | ICD-10-CM | POA: Insufficient documentation

## 2023-07-11 DIAGNOSIS — J441 Chronic obstructive pulmonary disease with (acute) exacerbation: Secondary | ICD-10-CM | POA: Insufficient documentation

## 2023-07-11 DIAGNOSIS — Z87891 Personal history of nicotine dependence: Secondary | ICD-10-CM | POA: Diagnosis not present

## 2023-07-11 DIAGNOSIS — Z803 Family history of malignant neoplasm of breast: Secondary | ICD-10-CM | POA: Diagnosis not present

## 2023-07-11 DIAGNOSIS — D649 Anemia, unspecified: Secondary | ICD-10-CM | POA: Diagnosis not present

## 2023-07-11 DIAGNOSIS — N3000 Acute cystitis without hematuria: Secondary | ICD-10-CM | POA: Diagnosis not present

## 2023-07-11 DIAGNOSIS — I700 Atherosclerosis of aorta: Secondary | ICD-10-CM | POA: Diagnosis not present

## 2023-07-11 DIAGNOSIS — J9611 Chronic respiratory failure with hypoxia: Secondary | ICD-10-CM | POA: Diagnosis not present

## 2023-07-11 DIAGNOSIS — I5032 Chronic diastolic (congestive) heart failure: Secondary | ICD-10-CM | POA: Diagnosis not present

## 2023-07-11 DIAGNOSIS — Z821 Family history of blindness and visual loss: Secondary | ICD-10-CM | POA: Diagnosis not present

## 2023-07-11 DIAGNOSIS — Z800 Family history of malignant neoplasm of digestive organs: Secondary | ICD-10-CM | POA: Diagnosis not present

## 2023-07-11 DIAGNOSIS — G9340 Encephalopathy, unspecified: Secondary | ICD-10-CM | POA: Diagnosis not present

## 2023-07-11 DIAGNOSIS — R6889 Other general symptoms and signs: Secondary | ICD-10-CM | POA: Diagnosis not present

## 2023-07-11 DIAGNOSIS — Z9981 Dependence on supplemental oxygen: Secondary | ICD-10-CM | POA: Diagnosis not present

## 2023-07-11 DIAGNOSIS — G9341 Metabolic encephalopathy: Secondary | ICD-10-CM | POA: Diagnosis not present

## 2023-07-11 DIAGNOSIS — E7800 Pure hypercholesterolemia, unspecified: Secondary | ICD-10-CM | POA: Diagnosis not present

## 2023-07-11 DIAGNOSIS — Z7982 Long term (current) use of aspirin: Secondary | ICD-10-CM | POA: Insufficient documentation

## 2023-07-11 DIAGNOSIS — Z23 Encounter for immunization: Secondary | ICD-10-CM | POA: Diagnosis not present

## 2023-07-11 DIAGNOSIS — I517 Cardiomegaly: Secondary | ICD-10-CM | POA: Diagnosis not present

## 2023-07-11 DIAGNOSIS — F419 Anxiety disorder, unspecified: Secondary | ICD-10-CM | POA: Diagnosis present

## 2023-07-11 DIAGNOSIS — S199XXA Unspecified injury of neck, initial encounter: Secondary | ICD-10-CM | POA: Diagnosis not present

## 2023-07-11 DIAGNOSIS — J9621 Acute and chronic respiratory failure with hypoxia: Secondary | ICD-10-CM | POA: Diagnosis not present

## 2023-07-11 DIAGNOSIS — I110 Hypertensive heart disease with heart failure: Secondary | ICD-10-CM | POA: Diagnosis not present

## 2023-07-11 DIAGNOSIS — K219 Gastro-esophageal reflux disease without esophagitis: Secondary | ICD-10-CM | POA: Diagnosis not present

## 2023-07-11 DIAGNOSIS — M25552 Pain in left hip: Secondary | ICD-10-CM | POA: Diagnosis not present

## 2023-07-11 DIAGNOSIS — Z1629 Resistance to other single specified antibiotic: Secondary | ICD-10-CM | POA: Diagnosis not present

## 2023-07-11 DIAGNOSIS — R062 Wheezing: Secondary | ICD-10-CM | POA: Diagnosis not present

## 2023-07-11 DIAGNOSIS — Z743 Need for continuous supervision: Secondary | ICD-10-CM | POA: Diagnosis not present

## 2023-07-11 DIAGNOSIS — J984 Other disorders of lung: Secondary | ICD-10-CM | POA: Diagnosis not present

## 2023-07-11 DIAGNOSIS — Z808 Family history of malignant neoplasm of other organs or systems: Secondary | ICD-10-CM | POA: Diagnosis not present

## 2023-07-11 DIAGNOSIS — R404 Transient alteration of awareness: Secondary | ICD-10-CM | POA: Diagnosis not present

## 2023-07-11 DIAGNOSIS — R0602 Shortness of breath: Secondary | ICD-10-CM | POA: Diagnosis not present

## 2023-07-11 DIAGNOSIS — G4489 Other headache syndrome: Secondary | ICD-10-CM | POA: Diagnosis not present

## 2023-07-11 LAB — CBC WITH DIFFERENTIAL/PLATELET
Abs Immature Granulocytes: 0.15 10*3/uL — ABNORMAL HIGH (ref 0.00–0.07)
Basophils Absolute: 0 10*3/uL (ref 0.0–0.1)
Basophils Relative: 0 %
Eosinophils Absolute: 0.1 10*3/uL (ref 0.0–0.5)
Eosinophils Relative: 1 %
HCT: 35.2 % — ABNORMAL LOW (ref 36.0–46.0)
Hemoglobin: 9.8 g/dL — ABNORMAL LOW (ref 12.0–15.0)
Immature Granulocytes: 1 %
Lymphocytes Relative: 10 %
Lymphs Abs: 1.3 10*3/uL (ref 0.7–4.0)
MCH: 29.2 pg (ref 26.0–34.0)
MCHC: 27.8 g/dL — ABNORMAL LOW (ref 30.0–36.0)
MCV: 104.8 fL — ABNORMAL HIGH (ref 80.0–100.0)
Monocytes Absolute: 1 10*3/uL (ref 0.1–1.0)
Monocytes Relative: 7 %
Neutro Abs: 10.8 10*3/uL — ABNORMAL HIGH (ref 1.7–7.7)
Neutrophils Relative %: 81 %
Platelets: 164 10*3/uL (ref 150–400)
RBC: 3.36 MIL/uL — ABNORMAL LOW (ref 3.87–5.11)
RDW: 12.7 % (ref 11.5–15.5)
WBC: 13.2 10*3/uL — ABNORMAL HIGH (ref 4.0–10.5)
nRBC: 0 % (ref 0.0–0.2)

## 2023-07-11 LAB — BASIC METABOLIC PANEL
Anion gap: 7 (ref 5–15)
BUN: 19 mg/dL (ref 8–23)
CO2: 38 mmol/L — ABNORMAL HIGH (ref 22–32)
Calcium: 8.3 mg/dL — ABNORMAL LOW (ref 8.9–10.3)
Chloride: 91 mmol/L — ABNORMAL LOW (ref 98–111)
Creatinine, Ser: 0.8 mg/dL (ref 0.44–1.00)
GFR, Estimated: >60 mL/min (ref >60)
Glucose, Bld: 168 mg/dL — ABNORMAL HIGH (ref 70–99)
Potassium: 3.8 mmol/L (ref 3.5–5.1)
Sodium: 136 mmol/L (ref 135–145)

## 2023-07-11 LAB — URINALYSIS, W/ REFLEX TO CULTURE (INFECTION SUSPECTED)
Bilirubin Urine: NEGATIVE
Glucose, UA: NEGATIVE mg/dL
Hgb urine dipstick: NEGATIVE
Ketones, ur: NEGATIVE mg/dL
Nitrite: NEGATIVE
Protein, ur: 30 mg/dL — AB
Specific Gravity, Urine: 1.013 (ref 1.005–1.030)
WBC, UA: >50 WBC/hpf (ref 0–5)
pH: 5 (ref 5.0–8.0)

## 2023-07-11 MED ORDER — CEPHALEXIN 500 MG PO CAPS
500.0000 mg | ORAL_CAPSULE | Freq: Once | ORAL | Status: AC
  Administered 2023-07-11: 500 mg via ORAL
  Filled 2023-07-11: qty 1

## 2023-07-11 MED ORDER — PREDNISONE 10 MG PO TABS: 20.0000 mg | ORAL_TABLET | Freq: Every day | ORAL | 0 refills | Status: DC

## 2023-07-11 MED ORDER — CEPHALEXIN 500 MG PO CAPS: 500.0000 mg | ORAL_CAPSULE | Freq: Four times a day (QID) | ORAL | 0 refills | Status: DC

## 2023-07-11 NOTE — ED Notes (Signed)
Pt up to [facility] with nurse assist. Pt O2 dropped to 80%. Dr Rodena Medin notified. Pt O2 recovered to 96% after resting for 30 seconds.

## 2023-07-11 NOTE — ED Triage Notes (Signed)
BIB EMS from home for sob that started today. Pt is supposed to take neb treatments daily, but has not done any in a while. Pt checked o2 and was in the 70s. Pt wears 3L [place] at home. Pt was on 4L when EMS arrived and was 81%. Pt ambulated on scene after 2 duonebs and dropped to 70% and was in distress. Pt is now 91% on 4L after resting.  Hx COPD and CHF

## 2023-07-11 NOTE — Discharge Instructions (Signed)
  Return for any problem.  Take all of Keflex as prescribed to treat UTI.

## 2023-07-11 NOTE — ED Provider Notes (Signed)
[place] EMERGENCY DEPARTMENT AT [facility] Provider Note   CSN: [id number] Arrival date & time: 07/11/23  1753     History  Chief Complaint  Patient presents with   Shortness of Breath    [name] is a 72 y.o. female.  72 year old female with prior medical history as detailed below presents for evaluation.  Patient arrives with EMS transport.  EMS administered 2 DuoNeb breathing treatments and 125 mg Solu-Medrol IM prior to arrival.  Patient came to the ED for reported shortness of breath.  Patient with COPD.  Patient reports that she wears 3 to 4 L at baseline.  Patient is reporting that she felt more wheezy and short of breath throughout the day.  On arrival to the ED after EMS treatment she says that she feels significantly better.  She denies chest pain or fever.  She denies other complaint.  The history is provided by the patient and medical records.       Home Medications Prior to Admission medications   Medication Sig Start Date End Date Taking? Authorizing Provider  albuterol (VENTOLIN HFA) 108 (90 Base) MCG/ACT inhaler Inhale 2 puffs into the lungs every 6 (six) hours as needed for wheezing or shortness of breath. For shortness of breath 12/27/22   Nyoka Cowden, MD  aspirin EC 81 MG tablet Take 81 mg by mouth in the morning. Swallow whole.    [provider]  atorvastatin (LIPITOR) 80 MG tablet Take 1 tablet (80 mg total) by mouth daily. 07/07/22   Almon Hercules, MD  bethanechol (URECHOLINE) 10 MG tablet Take 1 tablet (10 mg total) by mouth in the morning and at bedtime. 06/08/23   McKenzie, Mardene Celeste, MD  Cyanocobalamin (VITAMIN B-12 CR) 1000 MCG TBCR Take 1 tablet by mouth daily. 07/07/22   [provider]  diltiazem (CARDIZEM CD) 180 MG 24 hr capsule Take 1 capsule (180 mg total) by mouth daily. 07/08/22   Almon Hercules, MD  furosemide (LASIX) 20 MG tablet Take 1 tablet (20 mg total) by mouth daily. 07/08/22   Almon Hercules, MD   gabapentin (NEURONTIN) 300 MG capsule Take 1 capsule (300 mg total) by mouth daily for 1 day, THEN 1 capsule (300 mg total) 2 (two) times daily for 1 day, THEN 1 capsule (300 mg total) 3 (three) times daily. 07/07/23 08/08/23  Rondel Baton, MD  HYDROcodone-acetaminophen (NORCO) 10-325 MG tablet Take 1 tablet by mouth 4 (four) times daily as needed. 07/14/22   [provider]  losartan (COZAAR) 50 MG tablet Take 50 mg by mouth daily. 07/22/22   [provider]  melatonin 5 MG TABS Take 1 tablet (5 mg total) by mouth once as needed. 07/07/22   Almon Hercules, MD  Multiple Vitamin (MULTIVITAMIN WITH MINERALS) TABS tablet Take 1 tablet by mouth in the morning.    [provider]  pantoprazole (PROTONIX) 20 MG tablet Take 1 tablet (20 mg total) by mouth daily. 07/08/22   Almon Hercules, MD  polyethylene glycol (MIRALAX / GLYCOLAX) 17 g packet Take 17 g by mouth daily as needed. Sometimes forgets to take everyday.    [provider]  senna-docusate (SENOKOT-S) 8.6-50 MG tablet Take 1 tablet by mouth at bedtime. 07/07/22   Almon Hercules, MD  tamsulosin (FLOMAX) 0.4 MG CAPS capsule Take 1 capsule (0.4 mg total) by mouth daily after supper. 01/17/23   McKenzie, Mardene Celeste, MD  TRELEGY ELLIPTA 100-62.5-25 MCG/INH AEPB  Take 1 puff by mouth in the morning. 02/19/21   [provider]  vitamin B-12 1000 MCG tablet Take 1 tablet (1,000 mcg total) by mouth daily. 06/19/21   Lewie Chamber, MD      Allergies    Patient has no known allergies.    Review of Systems   Review of Systems  All other systems reviewed and are negative.   Physical Exam Updated Vital Signs BP 125/65 (BP Location: Right Arm)   Pulse 83   Temp 98.2 F (36.8 C) (Oral)   Resp 16   SpO2 94%  Physical Exam Vitals and nursing note reviewed.  Constitutional:      General: She is not in acute distress.    Appearance: Normal appearance. She is well-developed.  HENT:     Head: Normocephalic and  atraumatic.  Eyes:     Conjunctiva/sclera: Conjunctivae normal.     Pupils: Pupils are equal, round, and reactive to light.  Cardiovascular:     Rate and Rhythm: Normal rate and regular rhythm.     Heart sounds: Normal heart sounds.  Pulmonary:     Effort: Pulmonary effort is normal. No respiratory distress.     Comments: Mild scattered expiratory wheezes in all lung fields Abdominal:     General: There is no distension.     Palpations: Abdomen is soft.     Tenderness: There is no abdominal tenderness.  Musculoskeletal:        General: No deformity. Normal range of motion.     Cervical back: Normal range of motion and neck supple.  Skin:    General: Skin is warm and dry.  Neurological:     General: No focal deficit present.     Mental Status: She is alert and oriented to person, place, and time.     ED Results / Procedures / Treatments   Labs (all labs ordered are listed, but only abnormal results are displayed) Labs Reviewed  CBC WITH DIFFERENTIAL/PLATELET  BASIC METABOLIC PANEL    EKG None  Radiology No results found.  Procedures Procedures    Medications Ordered in ED Medications - No data to display  ED Course/ Medical Decision Making/ A&P                             Medical Decision Making Amount and/or Complexity of Data Reviewed Labs: ordered. Radiology: ordered.  Risk Prescription drug management.    Medical Screen Complete  This patient presented to the ED with complaint of shortness of breath.  This complaint involves an extensive number of treatment options. The initial differential diagnosis includes, but is not limited to, COPD exacerbation, pneumonia, metabolic abnormality, viral versus bacterial infection  This presentation is: Acute, Chronic, Self-Limited, Previously Undiagnosed, Uncertain Prognosis, Complicated, Systemic Symptoms, and Threat to Life/Bodily Function  Patient with longstanding history of COPD.  Patient arrived with  EMS after reporting increased shortness of breath.  After EMS treatment patient feels significant improved.  Patient does complain during evaluation of painful urination.  She is concerned about possible UTI.  UA suggest UTI.  After ED evaluation the patient feels improved.  She desires discharge home.  She feels comfortable going home.  She declines overnight observation or admission.  Patient is advised to complete course of antibiotics for treatment of UTI.  Importance of close follow-up stressed.  Strict return precautions given and understood.  Additional history obtained: External records from outside sources obtained and  reviewed including prior ED visits and prior Inpatient records.    Lab Tests:  I ordered and personally interpreted labs.  Imaging Studies ordered:  I ordered imaging studies including CXR  I independently visualized and interpreted obtained imaging which showed NAD I agree with the radiologist interpretation.   Cardiac Monitoring:  The patient was maintained on a cardiac monitor.  I personally viewed and interpreted the cardiac monitor which showed an underlying rhythm of: NSR   Medicines ordered:  I ordered medication including keflex  for UTI  Reevaluation of the patient after these medicines showed that the patient: improved    Problem List / ED Course:  COPD exacerbation, UTI   Reevaluation:  After the interventions noted above, I reevaluated the patient and found that they have: improved  Disposition:  After consideration of the diagnostic results and the patients response to treatment, I feel that the patent would benefit from close outpatient follow-up.          Final Clinical Impression(s) / ED Diagnoses Final diagnoses:  COPD exacerbation (HCC)  Urinary tract infection without hematuria, site unspecified    Rx / DC Orders ED Discharge Orders          Ordered    cephALEXin (KEFLEX) 500 MG capsule  4 times daily         07/11/23 2133    predniSONE (DELTASONE) 10 MG tablet  Daily        07/11/23 2133              Wynetta Fines, MD 07/11/23 2138

## 2023-07-12 ENCOUNTER — Encounter (HOSPITAL_COMMUNITY): Payer: Self-pay

## 2023-07-12 ENCOUNTER — Other Ambulatory Visit: Payer: Self-pay

## 2023-07-12 ENCOUNTER — Inpatient Hospital Stay (HOSPITAL_COMMUNITY)
Admission: EM | Admit: 2023-07-12 | Discharge: 2023-07-14 | DRG: 689 | Disposition: A | Discharge status: Home / Self Care | Payer: Medicare Other | Attending: Family Medicine | Admitting: Family Medicine

## 2023-07-12 ENCOUNTER — Emergency Department (HOSPITAL_COMMUNITY): Payer: Medicare Other

## 2023-07-12 DIAGNOSIS — Z808 Family history of malignant neoplasm of other organs or systems: Secondary | ICD-10-CM

## 2023-07-12 DIAGNOSIS — Z1611 Resistance to penicillins: Secondary | ICD-10-CM | POA: Diagnosis present

## 2023-07-12 DIAGNOSIS — Z803 Family history of malignant neoplasm of breast: Secondary | ICD-10-CM

## 2023-07-12 DIAGNOSIS — Z87891 Personal history of nicotine dependence: Secondary | ICD-10-CM

## 2023-07-12 DIAGNOSIS — Z821 Family history of blindness and visual loss: Secondary | ICD-10-CM

## 2023-07-12 DIAGNOSIS — Z800 Family history of malignant neoplasm of digestive organs: Secondary | ICD-10-CM

## 2023-07-12 DIAGNOSIS — Z79899 Other long term (current) drug therapy: Secondary | ICD-10-CM

## 2023-07-12 DIAGNOSIS — D649 Anemia, unspecified: Secondary | ICD-10-CM | POA: Diagnosis present

## 2023-07-12 DIAGNOSIS — Z1629 Resistance to other single specified antibiotic: Secondary | ICD-10-CM | POA: Diagnosis present

## 2023-07-12 DIAGNOSIS — J9611 Chronic respiratory failure with hypoxia: Secondary | ICD-10-CM | POA: Diagnosis present

## 2023-07-12 DIAGNOSIS — J441 Chronic obstructive pulmonary disease with (acute) exacerbation: Secondary | ICD-10-CM | POA: Diagnosis present

## 2023-07-12 DIAGNOSIS — Z9981 Dependence on supplemental oxygen: Secondary | ICD-10-CM

## 2023-07-12 DIAGNOSIS — Z7982 Long term (current) use of aspirin: Secondary | ICD-10-CM

## 2023-07-12 DIAGNOSIS — N3000 Acute cystitis without hematuria: Secondary | ICD-10-CM

## 2023-07-12 DIAGNOSIS — K219 Gastro-esophageal reflux disease without esophagitis: Secondary | ICD-10-CM | POA: Diagnosis present

## 2023-07-12 DIAGNOSIS — G9341 Metabolic encephalopathy: Secondary | ICD-10-CM | POA: Diagnosis not present

## 2023-07-12 DIAGNOSIS — B9620 Unspecified Escherichia coli [E. coli] as the cause of diseases classified elsewhere: Secondary | ICD-10-CM | POA: Diagnosis present

## 2023-07-12 DIAGNOSIS — F419 Anxiety disorder, unspecified: Secondary | ICD-10-CM | POA: Diagnosis present

## 2023-07-12 DIAGNOSIS — I5032 Chronic diastolic (congestive) heart failure: Secondary | ICD-10-CM | POA: Diagnosis present

## 2023-07-12 DIAGNOSIS — N390 Urinary tract infection, site not specified: Secondary | ICD-10-CM | POA: Diagnosis present

## 2023-07-12 DIAGNOSIS — Z8249 Family history of ischemic heart disease and other diseases of the circulatory system: Secondary | ICD-10-CM

## 2023-07-12 DIAGNOSIS — J449 Chronic obstructive pulmonary disease, unspecified: Secondary | ICD-10-CM | POA: Diagnosis present

## 2023-07-12 DIAGNOSIS — I110 Hypertensive heart disease with heart failure: Secondary | ICD-10-CM | POA: Diagnosis present

## 2023-07-12 DIAGNOSIS — E7800 Pure hypercholesterolemia, unspecified: Secondary | ICD-10-CM | POA: Diagnosis present

## 2023-07-12 DIAGNOSIS — I10 Essential (primary) hypertension: Secondary | ICD-10-CM | POA: Diagnosis present

## 2023-07-12 DIAGNOSIS — W19XXXA Unspecified fall, initial encounter: Principal | ICD-10-CM

## 2023-07-12 DIAGNOSIS — Z23 Encounter for immunization: Secondary | ICD-10-CM | POA: Diagnosis present

## 2023-07-12 DIAGNOSIS — Z7951 Long term (current) use of inhaled steroids: Secondary | ICD-10-CM | POA: Diagnosis not present

## 2023-07-12 HISTORY — DX: Dependence on supplemental oxygen: Z99.81

## 2023-07-12 MED ORDER — ACETAMINOPHEN 650 MG RE SUPP: 650.0000 mg | Freq: Four times a day (QID) | RECTAL | Status: DC | PRN

## 2023-07-12 MED ORDER — FUROSEMIDE 20 MG PO TABS: 20.0000 mg | ORAL_TABLET | Freq: Every day | ORAL | Status: DC | PRN

## 2023-07-12 MED ORDER — VITAMIN B-12 ER 1000 MCG PO TBCR: 1.0000 | EXTENDED_RELEASE_TABLET | Freq: Every day | ORAL | Status: DC

## 2023-07-12 MED ORDER — SODIUM CHLORIDE 0.9 % IV SOLN
1.0000 g | INTRAVENOUS | Status: DC
  Administered 2023-07-12 – 2023-07-14 (×3): 1 g via INTRAVENOUS
  Filled 2023-07-12 (×4): qty 10

## 2023-07-12 MED ORDER — HYDROMORPHONE HCL 1 MG/ML IJ SOLN
0.5000 mg | Freq: Once | INTRAMUSCULAR | Status: AC
  Administered 2023-07-12: 0.5 mg via INTRAVENOUS
  Filled 2023-07-12: qty 1

## 2023-07-12 MED ORDER — DILTIAZEM HCL ER COATED BEADS 180 MG PO CP24
180.0000 mg | ORAL_CAPSULE | Freq: Every day | ORAL | Status: DC
  Administered 2023-07-12 – 2023-07-14 (×3): 180 mg via ORAL
  Filled 2023-07-12 (×3): qty 1

## 2023-07-12 MED ORDER — ATORVASTATIN CALCIUM 40 MG PO TABS
80.0000 mg | ORAL_TABLET | Freq: Every day | ORAL | Status: DC
  Administered 2023-07-12 – 2023-07-14 (×3): 80 mg via ORAL
  Filled 2023-07-12 (×3): qty 2

## 2023-07-12 MED ORDER — HYDROMORPHONE HCL 1 MG/ML IJ SOLN
1.0000 mg | Freq: Once | INTRAMUSCULAR | Status: AC
  Administered 2023-07-12: 1 mg via INTRAVENOUS
  Filled 2023-07-12: qty 1

## 2023-07-12 MED ORDER — ONDANSETRON HCL 4 MG/2ML IJ SOLN: 4.0000 mg | Freq: Four times a day (QID) | INTRAMUSCULAR | Status: DC | PRN

## 2023-07-12 MED ORDER — MELATONIN 5 MG PO TABS
5.0000 mg | ORAL_TABLET | Freq: Once | ORAL | Status: DC | PRN
  Administered 2023-07-12 – 2023-07-13 (×2): 5 mg via ORAL
  Filled 2023-07-12 (×2): qty 1

## 2023-07-12 MED ORDER — KETOROLAC TROMETHAMINE 15 MG/ML IJ SOLN
15.0000 mg | Freq: Once | INTRAMUSCULAR | Status: AC
  Administered 2023-07-12: 15 mg via INTRAVENOUS
  Filled 2023-07-12: qty 1

## 2023-07-12 MED ORDER — ESCITALOPRAM OXALATE 10 MG PO TABS
5.0000 mg | ORAL_TABLET | Freq: Every day | ORAL | Status: DC
  Administered 2023-07-12 – 2023-07-14 (×3): 5 mg via ORAL
  Filled 2023-07-12 (×3): qty 1

## 2023-07-12 MED ORDER — HYDROCODONE-ACETAMINOPHEN 10-325 MG PO TABS
1.0000 | ORAL_TABLET | Freq: Four times a day (QID) | ORAL | Status: DC | PRN
  Administered 2023-07-12 – 2023-07-14 (×7): 1 via ORAL
  Filled 2023-07-12 (×8): qty 1

## 2023-07-12 MED ORDER — HYDROMORPHONE HCL 1 MG/ML IJ SOLN: 1.0000 mg | Freq: Once | INTRAMUSCULAR | Status: DC

## 2023-07-12 MED ORDER — ACETAMINOPHEN 325 MG PO TABS: 650.0000 mg | ORAL_TABLET | Freq: Four times a day (QID) | ORAL | Status: DC | PRN

## 2023-07-12 MED ORDER — TETANUS-DIPHTH-ACELL PERTUSSIS 5-2.5-18.5 LF-MCG/0.5 IM SUSY
0.5000 mL | PREFILLED_SYRINGE | Freq: Once | INTRAMUSCULAR | Status: AC
  Administered 2023-07-12: 0.5 mL via INTRAMUSCULAR
  Filled 2023-07-12: qty 0.5

## 2023-07-12 MED ORDER — TAMSULOSIN HCL 0.4 MG PO CAPS
0.4000 mg | ORAL_CAPSULE | Freq: Every day | ORAL | Status: DC
  Administered 2023-07-12 – 2023-07-13 (×2): 0.4 mg via ORAL
  Filled 2023-07-12 (×2): qty 1

## 2023-07-12 MED ORDER — VITAMIN B-12 1000 MCG PO TABS
1000.0000 ug | ORAL_TABLET | Freq: Every day | ORAL | Status: DC
  Administered 2023-07-12 – 2023-07-14 (×3): 1000 ug via ORAL
  Filled 2023-07-12 (×3): qty 1

## 2023-07-12 MED ORDER — MIRTAZAPINE 15 MG PO TABS
7.5000 mg | ORAL_TABLET | Freq: Every evening | ORAL | Status: DC | PRN
  Administered 2023-07-12: 7.5 mg via ORAL
  Filled 2023-07-12: qty 1

## 2023-07-12 MED ORDER — PANTOPRAZOLE SODIUM 20 MG PO TBEC
20.0000 mg | DELAYED_RELEASE_TABLET | Freq: Every day | ORAL | Status: DC
  Administered 2023-07-12 – 2023-07-14 (×3): 20 mg via ORAL
  Filled 2023-07-12 (×4): qty 1

## 2023-07-12 MED ORDER — UMECLIDINIUM BROMIDE 62.5 MCG/ACT IN AEPB
1.0000 | INHALATION_SPRAY | Freq: Every day | RESPIRATORY_TRACT | Status: DC
  Administered 2023-07-13 – 2023-07-14 (×2): 1 via RESPIRATORY_TRACT
  Filled 2023-07-12: qty 7

## 2023-07-12 MED ORDER — BETHANECHOL CHLORIDE 10 MG PO TABS
10.0000 mg | ORAL_TABLET | Freq: Two times a day (BID) | ORAL | Status: DC
  Administered 2023-07-12 – 2023-07-14 (×5): 10 mg via ORAL
  Filled 2023-07-12 (×6): qty 1

## 2023-07-12 MED ORDER — ONDANSETRON HCL 4 MG PO TABS: 4.0000 mg | ORAL_TABLET | Freq: Four times a day (QID) | ORAL | Status: DC | PRN

## 2023-07-12 MED ORDER — ALPRAZOLAM 0.25 MG PO TABS
0.2500 mg | ORAL_TABLET | Freq: Two times a day (BID) | ORAL | Status: DC | PRN
  Administered 2023-07-12 – 2023-07-13 (×2): 0.25 mg via ORAL
  Filled 2023-07-12 (×2): qty 1

## 2023-07-12 MED ORDER — ALBUTEROL SULFATE (2.5 MG/3ML) 0.083% IN NEBU
2.5000 mg | INHALATION_SOLUTION | RESPIRATORY_TRACT | Status: DC | PRN
  Administered 2023-07-13: 2.5 mg via RESPIRATORY_TRACT
  Filled 2023-07-12: qty 3

## 2023-07-12 MED ORDER — LOSARTAN POTASSIUM 50 MG PO TABS
50.0000 mg | ORAL_TABLET | Freq: Every day | ORAL | Status: DC
  Administered 2023-07-12 – 2023-07-14 (×3): 50 mg via ORAL
  Filled 2023-07-12 (×3): qty 1

## 2023-07-12 MED ORDER — FLUTICASONE FUROATE-VILANTEROL 100-25 MCG/ACT IN AEPB
1.0000 | INHALATION_SPRAY | Freq: Every day | RESPIRATORY_TRACT | Status: DC
  Administered 2023-07-13 – 2023-07-14 (×2): 1 via RESPIRATORY_TRACT
  Filled 2023-07-12: qty 28

## 2023-07-12 MED ORDER — ASPIRIN 81 MG PO TBEC
81.0000 mg | DELAYED_RELEASE_TABLET | Freq: Every day | ORAL | Status: DC
  Administered 2023-07-12 – 2023-07-14 (×3): 81 mg via ORAL
  Filled 2023-07-12 (×3): qty 1

## 2023-07-12 NOTE — H&P (Signed)
History and Physical    Patient: [name] BJY:[id number] DOB: May 07, 1951 DOA: 07/12/2023 DOS: the patient was seen and examined on 07/12/2023 PCP: Benita Stabile, MD  Patient coming from: Home  Chief Complaint:  Chief Complaint  Patient presents with   Fall   Head Injury   HPI: [name] is a 72 y.o. female with medical history significant of  anxiety, atrophic vaginitis, left leg Baker's cyst, unspecified CHF, COPD on home oxygen, heart murmur, hematuria, history of sepsis, history of pyelonephritis, E. coli bacteremia, constipation, hematochezia, hypertension, hyperlipidemia, pulmonary nodules, nicotine addiction, history pneumonia, urinary urgency, urinary retention, vaginal bleeding who was brought via EMS to the emergency department after she tripped, then fell hitting her head on her dresser and sustaining a frontal laceration bled profusely initially.  Denied any prodromal symptoms.  This seems to have been an accident.  She denied fever, chills, rhinorrhea, sore throat or hemoptysis.  No chest pain, palpitations, diaphoresis, PND, orthopnea or recent pitting edema of the lower extremities.  nausea, emesis, diarrhea, constipation, melena or hematochezia.  Positive dysuria and frequency for the past 5 to 6 days, but no flank pain or hematuria.  No polyuria, polydipsia, polyphagia or blurred vision.  The patient was initially confused, but seems to have improved during the night.  Lab work: CBC showed a white count of 13.2 with 81% neutrophils, hemoglobin 9.8 g associated with an MCV of 104.8 fL and platelets 164.  BMP showed a sodium 136, potassium 3.8, chloride 91 and CO2 38 mmol/L with an anion gap of 7.  Glucose 160 a and calcium 8.3 mg/dL.  Renal function was normal.  Imaging: Portable 1 view chest radiograph with no active disease.  There is volume loss in the right thorax with stable pleural parenchymal scaring in the right lower lobe.  Cardiomegaly.  Left hip x-ray was  negative.  CT head without contrast with no evidence of acute intracranial abnormality.  CT cervical spine with no acute fracture or primary bone lesion or focal pathological process.  Soft tissues and spinal canal with no prevertebral fluid or swelling.  No visible hematoma.  This level showing generalized degenerative endplate and facet sparing.   . ED course: Initial vital signs were temperature 98.5 F, pulse 91, respiration 22, BP 167/68 mmHg O2 sat 94% on nasal cannula oxygen of 4 L/min.  The patient received hydromorphone 0.5 mg IVP and Tdap booster 0.5 mL IM.   Review of Systems: As mentioned in the history of present illness. All other systems reviewed and are negative. Past Medical History:  Diagnosis Date   Anxiety    Atrophic vaginitis 10/09/2015   Baker's cyst    left leg   Blood in urine 10/09/2015   CHF (congestive heart failure) (HCC)    COPD (chronic obstructive pulmonary disease) (HCC)    Heart murmur    Hematuria 10/09/2015   Hypertension    Nicotine addiction 04/07/2014   Oxygen dependent    4L/min [place] all the time   Pneumonia    Urinary urgency 01/25/2016   Vaginal bleeding 10/09/2015   Past Surgical History:  Procedure Laterality Date   ABDOMINAL HYSTERECTOMY     CATARACT EXTRACTION Left 03/2015   CESAREAN SECTION     COLONOSCOPY  10/19/2004   Dr. Jena Gauss- internal hemorrhoids, o/w normal rectum, normal colon   COLONOSCOPY N/A 07/30/2014   YQM:VHQIONG diverticulosis. Colonic polyp-removed TA. repeat TCS 07/2021   COLONOSCOPY WITH PROPOFOL N/A 03/29/2021   diverticulosis in sigmoid  and descending colon, one 5 mm polyp ascending colon (adenoma), non-bleeding internal hemorrhoids. 5 year surveillance   ESOPHAGOGASTRODUODENOSCOPY N/A 07/30/2014   ZOX:WRUEAV EGD   POLYPECTOMY  03/29/2021   Procedure: POLYPECTOMY;  Surgeon: Corbin Ade, MD;  Location: [facility] SUITE;  Service: Endoscopy;;   RADIAL HEAD ARTHROPLASTY Left 06/23/2017   Procedure: RADIAL HEAD ARTHROPLASTY  WITH LIGAMENT REPAIR;  Surgeon: Cammy Copa, MD;  Location: [facility] OR;  Service: Orthopedics;  Laterality: Left;   Social History:  reports that she quit smoking about 10 years ago. Her smoking use included cigarettes. She started smoking about 50 years ago. She has been exposed to tobacco smoke. She has never used smokeless tobacco. She reports current alcohol use. She reports that she does not use drugs.  No Known Allergies  Family History  Adopted: Yes  Problem Relation Age of Onset   Cancer Sister        pancreatic   Migraines Daughter    Cancer Daughter        pre cancerous cells on cervix   Cancer Sister        breast cancer, colon   Blindness Maternal Grandfather    Other Brother        murdered   Other Sister        ruptured colon   Cancer Sister        breast, skin   Other Brother        was in MVA   Cancer Sister        pancreatic   Hypertension Sister    Other Sister        ruptured colon   Colon cancer Neg Hx     Prior to Admission medications   Medication Sig Start Date End Date Taking? Authorizing Provider  mirtazapine (REMERON) 7.5 MG tablet Take 7.5 mg by mouth at bedtime. 02/09/23  Yes [provider]  albuterol (VENTOLIN HFA) 108 (90 Base) MCG/ACT inhaler Inhale 2 puffs into the lungs every 6 (six) hours as needed for wheezing or shortness of breath. For shortness of breath 12/27/22   Nyoka Cowden, MD  ALPRAZolam Prudy Feeler) 0.25 MG tablet Take 0.25 mg by mouth 2 (two) times daily as needed.    [provider]  aspirin EC 81 MG tablet Take 81 mg by mouth in the morning. Swallow whole.    [provider]  atorvastatin (LIPITOR) 80 MG tablet Take 1 tablet (80 mg total) by mouth daily. 07/07/22   Almon Hercules, MD  bethanechol (URECHOLINE) 10 MG tablet Take 1 tablet (10 mg total) by mouth in the morning and at bedtime. 06/08/23   McKenzie, Mardene Celeste, MD  cephALEXin (KEFLEX) 500 MG capsule Take 1 capsule (500 mg total) by mouth 4 (four)  times daily. 07/11/23   Wynetta Fines, MD  Cyanocobalamin (VITAMIN B-12 CR) 1000 MCG TBCR Take 1 tablet by mouth daily. 07/07/22   [provider]  diltiazem (CARDIZEM CD) 180 MG 24 hr capsule Take 1 capsule (180 mg total) by mouth daily. 07/08/22   Almon Hercules, MD  escitalopram (LEXAPRO) 5 MG tablet Take 5 mg by mouth daily.    [provider]  furosemide (LASIX) 20 MG tablet Take 1 tablet (20 mg total) by mouth daily. 07/08/22   Almon Hercules, MD  gabapentin (NEURONTIN) 300 MG capsule Take 1 capsule (300 mg total) by mouth daily for 1 day, THEN 1 capsule (300 mg total) 2 (two) times daily for  1 day, THEN 1 capsule (300 mg total) 3 (three) times daily. 07/07/23 08/08/23  Rondel Baton, MD  HYDROcodone-acetaminophen (NORCO) 10-325 MG tablet Take 1 tablet by mouth 4 (four) times daily as needed. 07/14/22   [provider]  losartan (COZAAR) 50 MG tablet Take 50 mg by mouth daily. 07/22/22   [provider]  melatonin 5 MG TABS Take 1 tablet (5 mg total) by mouth once as needed. 07/07/22   Almon Hercules, MD  Multiple Vitamin (MULTIVITAMIN WITH MINERALS) TABS tablet Take 1 tablet by mouth in the morning.    [provider]  pantoprazole (PROTONIX) 20 MG tablet Take 1 tablet (20 mg total) by mouth daily. 07/08/22   Almon Hercules, MD  polyethylene glycol (MIRALAX / GLYCOLAX) 17 g packet Take 17 g by mouth daily as needed. Sometimes forgets to take everyday.    [provider]  predniSONE (DELTASONE) 10 MG tablet Take 2 tablets (20 mg total) by mouth daily. 07/11/23   Wynetta Fines, MD  senna-docusate (SENOKOT-S) 8.6-50 MG tablet Take 1 tablet by mouth at bedtime. 07/07/22   Almon Hercules, MD  tamsulosin (FLOMAX) 0.4 MG CAPS capsule Take 1 capsule (0.4 mg total) by mouth daily after supper. 01/17/23   McKenzie, Mardene Celeste, MD  TRELEGY ELLIPTA 100-62.5-25 MCG/INH AEPB Take 1 puff by mouth in the morning. 02/19/21   [provider]  vitamin B-12  1000 MCG tablet Take 1 tablet (1,000 mcg total) by mouth daily. 06/19/21   Lewie Chamber, MD    Physical Exam: Vitals:   07/12/23 0448 07/12/23 0504 07/12/23 0506  BP:  (!) 167/68   Pulse:  91   Resp:  (!) 22   Temp:  98.5 F (36.9 C)   SpO2: 91% 94%   Weight:   81.6 kg  Height:   5\' 3"  (1.6 m)   Physical Exam Vitals and nursing note reviewed.  Constitutional:      General: She is awake. She is not in acute distress. HENT:     Head: Normocephalic.     Nose: No rhinorrhea.     Mouth/Throat:     Mouth: Mucous membranes are moist.  Eyes:     General: No scleral icterus.    Pupils: Pupils are equal, round, and reactive to light.  Neck:     Vascular: No JVD.  Cardiovascular:     Rate and Rhythm: Normal rate and regular rhythm.     Heart sounds: S1 normal and S2 normal.  Pulmonary:     Effort: Pulmonary effort is normal.     Breath sounds: Normal breath sounds. No wheezing, rhonchi or rales.  Abdominal:     General: Bowel sounds are normal.     Palpations: Abdomen is soft.     Tenderness: There is abdominal tenderness in the suprapubic area. There is no right CVA tenderness, left CVA tenderness, guarding or rebound.  Musculoskeletal:     Cervical back: Neck supple.     Right lower leg: No edema.     Left lower leg: No edema.  Skin:    General: Skin is warm and dry.  Neurological:     General: No focal deficit present.     Mental Status: She is alert and oriented to person, place, and time.  Psychiatric:        Mood and Affect: Mood normal.        Behavior: Behavior normal. Behavior is cooperative.     Data Reviewed:  Results are pending, will review when available.  Assessment and Plan: Principal Problem:   Acute metabolic encephalopathy In the setting of fall with head trauma And:   Acute lower urinary tract infection Admit to PCU/inpatient. Continue ceftriaxone 1 g IVPB daily. Analgesics as needed. Antiemetics as needed. Follow-up urine culture and  sensitivity Follow CBC and CMP in a.m.  Active Problems:   GERD (gastroesophageal reflux disease) Continue pantoprazole 40 mg p.o. daily.    Hypertension Continue losartan 50 mg p.o. daily.    Anxiety Continue escitalopram 5 mg p.o. daily. Continue mirtazapine 7.5 mg daily.    Hypercholesterolemia Continue atorvastatin 80 mg p.o. daily.    Normocytic anemia Monitor hematocrit and hemoglobin.    COPD GOLD 4  Continue home supplemental oxygen. Continue Trelegy Ellipta daily. Continue albuterol nebs as needed.     Advance Care Planning:   Code Status: Full Code   Consults:   Family Communication:   Severity of Illness: The appropriate patient status for this patient is INPATIENT. Inpatient status is judged to be reasonable and necessary in order to provide the required intensity of service to ensure the patient's safety. The patient's presenting symptoms, physical exam findings, and initial radiographic and laboratory data in the context of their chronic comorbidities is felt to place them at high risk for further clinical deterioration. Furthermore, it is not anticipated that the patient will be medically stable for discharge from the hospital within 2 midnights of admission.   * I certify that at the point of admission it is my clinical judgment that the patient will require inpatient hospital care spanning beyond 2 midnights from the point of admission due to high intensity of service, high risk for further deterioration and high frequency of surveillance required.*  Author: Bobette Mo, MD 07/12/2023 7:18 AM  For on call review [email or web address].   This document was prepared using Dragon voice recognition software and may contain some unintended transcription errors.

## 2023-07-12 NOTE — ED Triage Notes (Signed)
Pt arrives via EMS from home after she tripped and fell hitting her head on her dresser. Pt reports c/o pain in her head, and bilateral shoulders. Pt has obvious laceration to top of head, bleeding is controlled at this time. GCS 15. MAE. PMS intact. Pt reports she is currently receiving treatment for a UTI.

## 2023-07-12 NOTE — ED Notes (Signed)
Lav, Dk Chilton Si, Clinton Gallant, Gold tops sent to lab.

## 2023-07-12 NOTE — ED Notes (Signed)
ED TO INPATIENT HANDOFF REPORT  Name/Age/Gender [name] 72 y.o. female  Code Status Code Status History     Date Active Date Inactive Code Status Order ID Comments User Context   06/30/2022 0254 07/08/2022 0058 Full Code [id number]  Darlin Drop, DO ED   06/15/2021 0802 06/18/2021 2246 Full Code [id number]  Ollen Bowl, MD ED       Home/SNF/Other Home  Chief Complaint Acute metabolic encephalopathy [G93.41]  Level of Care/Admitting Diagnosis ED Disposition     ED Disposition  Admit   Condition  --   Comment  Hospital Area: [facility] [[id number]]  Level of Care: Progressive [102]  Admit to Progressive based on following criteria: MULTISYSTEM THREATS such as stable sepsis, metabolic/electrolyte imbalance with or without encephalopathy that is responding to early treatment.  May admit patient to [facility] or [facility] if equivalent level of care is available:: No  Covid Evaluation: Asymptomatic - no recent exposure (last 10 days) testing not required  Diagnosis: Acute metabolic encephalopathy [[id number]]  Admitting Physician: Bobette Mo [[id number]]  Attending Physician: Bobette Mo [[id number]]  Certification:: I certify this patient will need inpatient services for at least 2 midnights  Estimated Length of Stay: 2          Medical History Past Medical History:  Diagnosis Date   Anxiety    Atrophic vaginitis 10/09/2015   Baker's cyst    left leg   Blood in urine 10/09/2015   CHF (congestive heart failure) (HCC)    COPD (chronic obstructive pulmonary disease) (HCC)    Heart murmur    Hematuria 10/09/2015   Hypertension    Nicotine addiction 04/07/2014   Oxygen dependent    4L/min [place] all the time   Pneumonia    Urinary urgency 01/25/2016   Vaginal bleeding 10/09/2015    Allergies No Known Allergies  IV Location/Drains/Wounds Patient Lines/Drains/Airways Status     Active Line/Drains/Airways     Name  Placement date Placement time Site Days   Peripheral IV 07/12/23 20 G 1" Left;Posterior Hand 07/12/23  0727  Hand  less than 1            Labs/Imaging Results for orders placed or performed during the hospital encounter of 07/11/23 (from the past 48 hour(s))  CBC with Differential     Status: Abnormal   Collection Time: 07/11/23  6:13 PM  Result Value Ref Range   WBC 13.2 (H) 4.0 - 10.5 K/uL   RBC 3.36 (L) 3.87 - 5.11 MIL/uL   Hemoglobin 9.8 (L) 12.0 - 15.0 g/dL   HCT 74.2 (L) 59.5 - 63.8 %   MCV 104.8 (H) 80.0 - 100.0 fL   MCH 29.2 26.0 - 34.0 pg   MCHC 27.8 (L) 30.0 - 36.0 g/dL   RDW 75.6 43.3 - 29.5 %   Platelets 164 150 - 400 K/uL   nRBC 0.0 0.0 - 0.2 %   Neutrophils Relative % 81 %   Neutro Abs 10.8 (H) 1.7 - 7.7 K/uL   Lymphocytes Relative 10 %   Lymphs Abs 1.3 0.7 - 4.0 K/uL   Monocytes Relative 7 %   Monocytes Absolute 1.0 0.1 - 1.0 K/uL   Eosinophils Relative 1 %   Eosinophils Absolute 0.1 0.0 - 0.5 K/uL   Basophils Relative 0 %   Basophils Absolute 0.0 0.0 - 0.1 K/uL   Immature Granulocytes 1 %   Abs Immature Granulocytes 0.15 (H) 0.00 - 0.07 K/uL  Comment: Performed at [facility], [address], [place], [place] [address]  Basic metabolic panel     Status: Abnormal   Collection Time: 07/11/23  6:13 PM  Result Value Ref Range   Sodium 136 135 - 145 mmol/L   Potassium 3.8 3.5 - 5.1 mmol/L   Chloride 91 (L) 98 - 111 mmol/L   CO2 38 (H) 22 - 32 mmol/L   Glucose, Bld 168 (H) 70 - 99 mg/dL    Comment: Glucose reference range applies only to samples taken after fasting for at least 8 hours.   BUN 19 8 - 23 mg/dL   Creatinine, Ser 2.95 0.44 - 1.00 mg/dL   Calcium 8.3 (L) 8.9 - 10.3 mg/dL   GFR, Estimated >62 >13 mL/min    Comment: (NOTE) Calculated using the CKD-EPI Creatinine Equation (2021)    Anion gap 7 5 - 15    Comment: Performed at [facility], [address], [place], [place] [address]  Urinalysis, w/ Reflex  to Culture (Infection Suspected) -Urine, Clean Catch     Status: Abnormal   Collection Time: 07/11/23  9:03 PM  Result Value Ref Range   Specimen Source URINE, CLEAN CATCH    Color, Urine YELLOW YELLOW   APPearance CLOUDY (A) CLEAR   Specific Gravity, Urine 1.013 1.005 - 1.030   pH 5.0 5.0 - 8.0   Glucose, UA NEGATIVE NEGATIVE mg/dL   Hgb urine dipstick NEGATIVE NEGATIVE   Bilirubin Urine NEGATIVE NEGATIVE   Ketones, ur NEGATIVE NEGATIVE mg/dL   Protein, ur 30 (A) NEGATIVE mg/dL   Nitrite NEGATIVE NEGATIVE   Leukocytes,Ua LARGE (A) NEGATIVE   RBC / HPF 0-5 0 - 5 RBC/hpf   WBC, UA >50 0 - 5 WBC/hpf    Comment:        Reflex urine culture not performed if WBC <=10, OR if Squamous epithelial cells >5. If Squamous epithelial cells >5 suggest recollection.    Bacteria, UA MANY (A) NONE SEEN   Squamous Epithelial / HPF 0-5 0 - 5 /HPF   WBC Clumps PRESENT    Mucus PRESENT    Hyaline Casts, UA PRESENT    Non Squamous Epithelial 0-5 (A) NONE SEEN    Comment: Performed at [facility], [address] [place], [place] [address]   DG Hip Lucienne Capers or Wo Pelvis 2-3 Views Left  Result Date: 07/12/2023 CLINICAL DATA:  Fall with left hip pain. EXAM: DG HIP (WITH OR WITHOUT PELVIS) 2-3V LEFT COMPARISON:  Left femur films from 07/07/2023. FINDINGS: No fracture evident. SI joints and symphysis pubis unremarkable. No hip dislocation. AP and frog-leg lateral views of the left hip show no femoral neck fracture. No substantial degenerative change in the left hip. IMPRESSION: Negative. Electronically Signed   By: Kennith Center M.D.   On: 07/12/2023 07:06   CT Head Wo Contrast  Result Date: 07/12/2023 CLINICAL DATA:  Blunt poly trauma. EXAM: CT HEAD WITHOUT CONTRAST CT CERVICAL SPINE WITHOUT CONTRAST TECHNIQUE: Multidetector CT imaging of the head and cervical spine was performed following the standard protocol without intravenous contrast. Multiplanar CT image reconstructions of the  cervical spine were also generated. RADIATION DOSE REDUCTION: This exam was performed according to the departmental dose-optimization program which includes automated exposure control, adjustment of the mA and/or kV according to patient size and/or use of iterative reconstruction technique. COMPARISON:  06/30/2022 FINDINGS: CT HEAD FINDINGS Brain: No evidence of acute infarction, hemorrhage, hydrocephalus, extra-axial collection or mass  lesion/mass effect. Vascular: No hyperdense vessel or unexpected calcification. Skull: Normal. Negative for fracture or focal lesion. Sinuses/Orbits: No acute finding. CT CERVICAL SPINE FINDINGS Alignment: Normal. Skull base and vertebrae: No acute fracture. No primary bone lesion or focal pathologic process. Soft tissues and spinal canal: No prevertebral fluid or swelling. No visible canal hematoma. Disc levels:  Generalized degenerative endplate and facet spurring. Upper chest: Opacity and volume loss at the right apex. IMPRESSION: No evidence of acute intracranial or cervical spine injury. Electronically Signed   By: Tiburcio Pea M.D.   On: 07/12/2023 05:55   CT Cervical Spine Wo Contrast  Result Date: 07/12/2023 CLINICAL DATA:  Blunt poly trauma. EXAM: CT HEAD WITHOUT CONTRAST CT CERVICAL SPINE WITHOUT CONTRAST TECHNIQUE: Multidetector CT imaging of the head and cervical spine was performed following the standard protocol without intravenous contrast. Multiplanar CT image reconstructions of the cervical spine were also generated. RADIATION DOSE REDUCTION: This exam was performed according to the departmental dose-optimization program which includes automated exposure control, adjustment of the mA and/or kV according to patient size and/or use of iterative reconstruction technique. COMPARISON:  06/30/2022 FINDINGS: CT HEAD FINDINGS Brain: No evidence of acute infarction, hemorrhage, hydrocephalus, extra-axial collection or mass lesion/mass effect. Vascular: No hyperdense  vessel or unexpected calcification. Skull: Normal. Negative for fracture or focal lesion. Sinuses/Orbits: No acute finding. CT CERVICAL SPINE FINDINGS Alignment: Normal. Skull base and vertebrae: No acute fracture. No primary bone lesion or focal pathologic process. Soft tissues and spinal canal: No prevertebral fluid or swelling. No visible canal hematoma. Disc levels:  Generalized degenerative endplate and facet spurring. Upper chest: Opacity and volume loss at the right apex. IMPRESSION: No evidence of acute intracranial or cervical spine injury. Electronically Signed   By: Tiburcio Pea M.D.   On: 07/12/2023 05:55   DG Chest Port 1 View  Result Date: 07/11/2023 CLINICAL DATA:  Shortness of breath EXAM: PORTABLE CHEST 1 VIEW COMPARISON:  11/03/2022, CT chest 06/30/2023 FINDINGS: Volume loss right thorax. Pleuroparenchymal scarring in the right lower lung, stable. Enlarged cardiomediastinal silhouette. Aortic atherosclerosis. IMPRESSION: 1. No active disease. 2. Volume loss right thorax with stable pleuroparenchymal scarring in the right lower lung. Cardiomegaly Electronically Signed   By: Jasmine Pang M.D.   On: 07/11/2023 19:26    Pending Labs Unresulted Labs (From admission, onward)    None       Vitals/Pain Today's Vitals   07/12/23 0448 07/12/23 0504 07/12/23 0506 07/12/23 0736  BP:  (!) 167/68  (!) 146/79  Pulse:  91  95  Resp:  (!) 22  20  Temp:  98.5 F (36.9 C)    SpO2: 91% 94%  96%  Weight:   180 lb (81.6 kg)   Height:   5\' 3"  (1.6 m)   PainSc:   9      Isolation Precautions No active isolations  Medications Medications  Tdap (BOOSTRIX) injection 0.5 mL (0.5 mLs Intramuscular Given 07/12/23 0731)  HYDROmorphone (DILAUDID) injection 0.5 mg (0.5 mg Intravenous Given 07/12/23 0732)    Mobility walks

## 2023-07-12 NOTE — TOC Initial Note (Signed)
Transition of Care [facility]) - Initial/Assessment Note    Patient Details  Name: [name] MRN: [id number] Date of Birth: November 20, 1951  [facility] CM/SW Contact:    Lanier Clam, RN Phone Number: 07/12/2023, 12:15 PM  Clinical Narrative:  d/c plan home. Has home 02.Follow for d/c needs                 Expected Discharge Plan: Home/Self Care Barriers to Discharge: Continued Medical Work up   Patient Goals and CMS Choice Patient states their goals for this hospitalization and ongoing recovery are:: Home CMS Medicare.gov Compare Post Acute Care list provided to:: Patient Choice offered to / list presented to : Patient [place] ownership interest in [facility].provided to:: Patient    Expected Discharge Plan and Services   Discharge Planning Services: CM Consult   Living arrangements for the past 2 months: Single Family Home                                      Prior Living Arrangements/Services Living arrangements for the past 2 months: Single Family Home Lives with:: Adult Children Patient language and need for interpreter reviewed:: Yes Do you feel safe going back to the place where you live?: Yes      Need for Family Participation in Patient Care: Yes (Comment) Care giver support system in place?: Yes (comment)   Criminal Activity/Legal Involvement Pertinent to Current Situation/Hospitalization: No - Comment as needed  Activities of Daily Living Home Assistive Devices/Equipment: Oxygen ADL Screening (condition at time of admission) Patient's cognitive ability adequate to safely complete daily activities?: Yes Is the patient deaf or have difficulty hearing?: No Does the patient have difficulty seeing, even when wearing glasses/contacts?: No Does the patient have difficulty concentrating, remembering, or making decisions?: No Patient able to express need for assistance with ADLs?: No Does the patient have difficulty dressing or  bathing?: No Independently performs ADLs?: Yes (appropriate for developmental age) Does the patient have difficulty walking or climbing stairs?: No Weakness of Legs: Both Weakness of Arms/Hands: None  Permission Sought/Granted Permission sought to share information with : Case Manager Permission granted to share information with : Yes, Verbal Permission Granted  Share Information with NAME: Case Manager           Emotional Assessment Appearance:: Appears stated age Attitude/Demeanor/Rapport: Gracious Affect (typically observed): Accepting Orientation: : Oriented to Self, Oriented to Place, Oriented to  Time, Oriented to Situation Alcohol / Substance Use: Not Applicable Psych Involvement: No (comment)  Admission diagnosis:  Metabolic encephalopathy [G93.41] Acute cystitis without hematuria [N30.00] Fall, initial encounter [W19.XXXA] Acute metabolic encephalopathy [G93.41] Patient Active Problem List   Diagnosis Date Noted   COPD GOLD 4  07/27/2022   Chronic lower back pain 07/06/2022   Pulmonary nodule 07/06/2022   Obesity (BMI 30-39.9) 07/06/2022   Normocytic anemia 07/06/2022   Severe sepsis (HCC) 06/30/2022   Acute pyelonephritis 06/30/2022   E coli bacteremia 06/30/2022   Acute metabolic encephalopathy 06/30/2022   Acute on chronic respiratory failure with hypoxia and hypercapnia (HCC) 06/30/2022   Urinary tract infection 06/30/2022   Troponin level elevated    Hypercholesterolemia 03/24/2022   COPD exacerbation (HCC) 06/15/2021   Hypertension    Anxiety    Acute on chronic diastolic CHF (congestive heart failure) (HCC)    Therapeutic opioid-induced constipation (OIC) 05/27/2021   Urinary retention 07/01/2020   Hematochezia  11/09/2018   Presence of left artificial elbow joint 06/29/2017   Closed displaced fracture of head of radius with routine healing 06/29/2017   Urinary urgency 01/25/2016   Blood in urine 10/09/2015   Vaginal bleeding 10/09/2015   Atrophic  vaginitis 10/09/2015   Hematuria 10/09/2015   GERD (gastroesophageal reflux disease) 09/04/2014   Macrocytic anemia 09/04/2014   Constipation 07/14/2014   Nicotine addiction 04/07/2014   PCP:  Benita Stabile, MD Pharmacy:   [facility] - [place], [place] - [address] [place] [address] Phone: [phone] Fax: [phone]     Social Determinants of Health (SDOH) Social History: SDOH Screenings   Food Insecurity: No Food Insecurity (07/12/2023)  Housing: Low Risk  (07/12/2023)  Transportation Needs: No Transportation Needs (07/12/2023)  Utilities: Not At Risk (07/12/2023)  Tobacco Use: Medium Risk (07/12/2023)   SDOH Interventions:     Readmission Risk Interventions     No data to display

## 2023-07-12 NOTE — ED Provider Notes (Signed)
[place] [facility]ERGENCY DEPARTMENT AT [facility] Provider Note   CSN: [id number] Arrival date & time: 07/12/23  4098     History  Chief Complaint  Patient presents with   Fall   Head Injury    [name] is a 72 y.o. female.  HPI   Patient with medical history including COPD with chronic respiratory failure on 3 L via nasal cannula, hypertension, CHF, presenting after a fall, states that she went to use the restroom but her legs were too weak causing her to fall and hit the front of her dresser.  States that she landed on the ground had difficulty getting up.  She does not believe that she lost conscious, she is not on any anticoag's, she is endorsing some pain in her left hip but has no other complaints, not endorsing any neck pain back pain chest pain abdominal pain pain in the upper and/or lower extremities.  I reviewed patient's chart he was seen earlier for shortness of breath, EMS provided her with 2 DuoNebs 125 mg of Solu-Medrol, states that she felt more wheezy and short of breath throughout the day prompted to come in.  While in the ED patient had a chest x-ray with was unremarkable, CBC showing slight leukocytosis, BMP which is unremarkable, UA consistent with UTI, patient was given a dose of Keflex and was discharged home  Home Medications Prior to Admission medications   Medication Sig Start Date End Date Taking? Authorizing Provider  mirtazapine (REMERON) 7.5 MG tablet Take 7.5 mg by mouth at bedtime. 02/09/23  Yes [provider]  albuterol (VENTOLIN HFA) 108 (90 Base) MCG/ACT inhaler Inhale 2 puffs into the lungs every 6 (six) hours as needed for wheezing or shortness of breath. For shortness of breath 12/27/22   Nyoka Cowden, MD  ALPRAZolam Prudy Feeler) 0.25 MG tablet Take 0.25 mg by mouth 2 (two) times daily as needed.    [provider]  aspirin EC 81 MG tablet Take 81 mg by mouth in the morning. Swallow whole.    [provider]   atorvastatin (LIPITOR) 80 MG tablet Take 1 tablet (80 mg total) by mouth daily. 07/07/22   Almon Hercules, MD  bethanechol (URECHOLINE) 10 MG tablet Take 1 tablet (10 mg total) by mouth in the morning and at bedtime. 06/08/23   McKenzie, Mardene Celeste, MD  cephALEXin (KEFLEX) 500 MG capsule Take 1 capsule (500 mg total) by mouth 4 (four) times daily. 07/11/23   Wynetta Fines, MD  Cyanocobalamin (VITAMIN B-12 CR) 1000 MCG TBCR Take 1 tablet by mouth daily. 07/07/22   [provider]  diltiazem (CARDIZEM CD) 180 MG 24 hr capsule Take 1 capsule (180 mg total) by mouth daily. 07/08/22   Almon Hercules, MD  escitalopram (LEXAPRO) 5 MG tablet Take 5 mg by mouth daily.    [provider]  furosemide (LASIX) 20 MG tablet Take 1 tablet (20 mg total) by mouth daily. 07/08/22   Almon Hercules, MD  gabapentin (NEURONTIN) 300 MG capsule Take 1 capsule (300 mg total) by mouth daily for 1 day, THEN 1 capsule (300 mg total) 2 (two) times daily for 1 day, THEN 1 capsule (300 mg total) 3 (three) times daily. 07/07/23 08/08/23  Rondel Baton, MD  HYDROcodone-acetaminophen (NORCO) 10-325 MG tablet Take 1 tablet by mouth 4 (four) times daily as needed. 07/14/22   [provider]  losartan (COZAAR) 50 MG tablet Take 50 mg by mouth daily.  07/22/22   [provider]  melatonin 5 MG TABS Take 1 tablet (5 mg total) by mouth once as needed. 07/07/22   Almon Hercules, MD  Multiple Vitamin (MULTIVITAMIN WITH MINERALS) TABS tablet Take 1 tablet by mouth in the morning.    [provider]  pantoprazole (PROTONIX) 20 MG tablet Take 1 tablet (20 mg total) by mouth daily. 07/08/22   Almon Hercules, MD  polyethylene glycol (MIRALAX / GLYCOLAX) 17 g packet Take 17 g by mouth daily as needed. Sometimes forgets to take everyday.    [provider]  predniSONE (DELTASONE) 10 MG tablet Take 2 tablets (20 mg total) by mouth daily. 07/11/23   Wynetta Fines, MD  senna-docusate (SENOKOT-S) 8.6-50 MG  tablet Take 1 tablet by mouth at bedtime. 07/07/22   Almon Hercules, MD  tamsulosin (FLOMAX) 0.4 MG CAPS capsule Take 1 capsule (0.4 mg total) by mouth daily after supper. 01/17/23   McKenzie, Mardene Celeste, MD  TRELEGY ELLIPTA 100-62.5-25 MCG/INH AEPB Take 1 puff by mouth in the morning. 02/19/21   [provider]  vitamin B-12 1000 MCG tablet Take 1 tablet (1,000 mcg total) by mouth daily. 06/19/21   Lewie Chamber, MD      Allergies    Patient has no known allergies.    Review of Systems   Review of Systems  Constitutional:  Negative for chills and fever.  Respiratory:  Negative for shortness of breath.   Cardiovascular:  Negative for chest pain.  Gastrointestinal:  Negative for abdominal pain.  Neurological:  Negative for headaches.    Physical Exam Updated Vital Signs BP (!) 167/68 (BP Location: Left Arm)   Pulse 91   Temp 98.5 F (36.9 C)   Resp (!) 22   Ht 5\' 3"  (1.6 m)   Wt 81.6 kg   SpO2 94%   BMI 31.89 kg/m  Physical Exam Vitals and nursing note reviewed.  Constitutional:      General: She is not in acute distress.    Appearance: She is not ill-appearing.  HENT:     Head: Normocephalic and atraumatic.     Comments: Patient has a small avulsion noted on the top of her head, hemodynamically stable, there is no raccoon eyes or Battle sign noted.    Nose: No congestion.     Mouth/Throat:     Mouth: Mucous membranes are moist.     Pharynx: Oropharynx is clear. No oropharyngeal exudate or posterior oropharyngeal erythema.     Comments: No trismus no torticollis no oral trauma present. Eyes:     Extraocular Movements: Extraocular movements intact.     Conjunctiva/sclera: Conjunctivae normal.     Pupils: Pupils are equal, round, and reactive to light.  Cardiovascular:     Rate and Rhythm: Normal rate and regular rhythm.     Pulses: Normal pulses.     Heart sounds: No murmur heard.    No friction rub. No gallop.  Pulmonary:     Effort: No respiratory distress.      Breath sounds: No wheezing, rhonchi or rales.     Comments: No deformity of the chest present, chest was nontender, on 3 L via nasal cannula, patient has coarse sounding lung sounds, without wheezing or stridor present. Abdominal:     Palpations: Abdomen is soft.     Tenderness: There is no abdominal tenderness. There is no right CVA tenderness or left CVA tenderness.     Comments: No evidence of trauma of  the abdomen, abdomen soft nontender  Musculoskeletal:     Comments: Spine palpated was nontender to palpation no step-off deformities noted, no pelvis instability no leg shortening, patient does have some slight pain around the left trochanter.  Skin:    General: Skin is warm and dry.  Neurological:     Mental Status: She is alert.     Comments: No facial asymmetry no difficulty with word finding following two-step commands there is no regular weakness present.  Psychiatric:        Mood and Affect: Mood normal.     ED Results / Procedures / Treatments   Labs (all labs ordered are listed, but only abnormal results are displayed) Labs Reviewed - No data to display  EKG None  Radiology DG Hip Unilat W or Wo Pelvis 2-3 Views Left  Result Date: 07/12/2023 CLINICAL DATA:  Fall with left hip pain. EXAM: DG HIP (WITH OR WITHOUT PELVIS) 2-3V LEFT COMPARISON:  Left femur films from 07/07/2023. FINDINGS: No fracture evident. SI joints and symphysis pubis unremarkable. No hip dislocation. AP and frog-leg lateral views of the left hip show no femoral neck fracture. No substantial degenerative change in the left hip. IMPRESSION: Negative. Electronically Signed   By: Kennith Center M.D.   On: 07/12/2023 07:06   CT Head Wo Contrast  Result Date: 07/12/2023 CLINICAL DATA:  Blunt poly trauma. EXAM: CT HEAD WITHOUT CONTRAST CT CERVICAL SPINE WITHOUT CONTRAST TECHNIQUE: Multidetector CT imaging of the head and cervical spine was performed following the standard protocol without intravenous contrast.  Multiplanar CT image reconstructions of the cervical spine were also generated. RADIATION DOSE REDUCTION: This exam was performed according to the departmental dose-optimization program which includes automated exposure control, adjustment of the mA and/or kV according to patient size and/or use of iterative reconstruction technique. COMPARISON:  06/30/2022 FINDINGS: CT HEAD FINDINGS Brain: No evidence of acute infarction, hemorrhage, hydrocephalus, extra-axial collection or mass lesion/mass effect. Vascular: No hyperdense vessel or unexpected calcification. Skull: Normal. Negative for fracture or focal lesion. Sinuses/Orbits: No acute finding. CT CERVICAL SPINE FINDINGS Alignment: Normal. Skull base and vertebrae: No acute fracture. No primary bone lesion or focal pathologic process. Soft tissues and spinal canal: No prevertebral fluid or swelling. No visible canal hematoma. Disc levels:  Generalized degenerative endplate and facet spurring. Upper chest: Opacity and volume loss at the right apex. IMPRESSION: No evidence of acute intracranial or cervical spine injury. Electronically Signed   By: Tiburcio Pea M.D.   On: 07/12/2023 05:55   CT Cervical Spine Wo Contrast  Result Date: 07/12/2023 CLINICAL DATA:  Blunt poly trauma. EXAM: CT HEAD WITHOUT CONTRAST CT CERVICAL SPINE WITHOUT CONTRAST TECHNIQUE: Multidetector CT imaging of the head and cervical spine was performed following the standard protocol without intravenous contrast. Multiplanar CT image reconstructions of the cervical spine were also generated. RADIATION DOSE REDUCTION: This exam was performed according to the departmental dose-optimization program which includes automated exposure control, adjustment of the mA and/or kV according to patient size and/or use of iterative reconstruction technique. COMPARISON:  06/30/2022 FINDINGS: CT HEAD FINDINGS Brain: No evidence of acute infarction, hemorrhage, hydrocephalus, extra-axial collection or mass  lesion/mass effect. Vascular: No hyperdense vessel or unexpected calcification. Skull: Normal. Negative for fracture or focal lesion. Sinuses/Orbits: No acute finding. CT CERVICAL SPINE FINDINGS Alignment: Normal. Skull base and vertebrae: No acute fracture. No primary bone lesion or focal pathologic process. Soft tissues and spinal canal: No prevertebral fluid or swelling. No visible canal hematoma. Disc levels:  Generalized degenerative endplate and facet spurring. Upper chest: Opacity and volume loss at the right apex. IMPRESSION: No evidence of acute intracranial or cervical spine injury. Electronically Signed   By: Tiburcio Pea M.D.   On: 07/12/2023 05:55   DG Chest Port 1 View  Result Date: 07/11/2023 CLINICAL DATA:  Shortness of breath EXAM: PORTABLE CHEST 1 VIEW COMPARISON:  11/03/2022, CT chest 06/30/2023 FINDINGS: Volume loss right thorax. Pleuroparenchymal scarring in the right lower lung, stable. Enlarged cardiomediastinal silhouette. Aortic atherosclerosis. IMPRESSION: 1. No active disease. 2. Volume loss right thorax with stable pleuroparenchymal scarring in the right lower lung. Cardiomegaly Electronically Signed   By: Jasmine Pang M.D.   On: 07/11/2023 19:26    Procedures Procedures    Medications Ordered in ED Medications  Tdap (BOOSTRIX) injection 0.5 mL (has no administration in time range)  HYDROmorphone (DILAUDID) injection 0.5 mg (has no administration in time range)    ED Course/ Medical Decision Making/ A&P                             Medical Decision Making Amount and/or Complexity of Data Reviewed Radiology: ordered.  Risk Prescription drug management. Decision regarding hospitalization.   This patient presents to the ED for concern of fall, this involves an extensive number of treatment options, and is a complaint that carries with it a high risk of complications and morbidity.  The differential diagnosis includes intracranial bleed, thoracic/abdominal  trauma, orthopedic injury    Additional history obtained:  Additional history obtained from N/A External records from outside source obtained and reviewed including recent ER note   Co morbidities that complicate the patient evaluation  Chronic respiratory failure  Social Determinants of Health:  Geriatric    Lab Tests:  I Ordered, and personally interpreted labs.  The pertinent results include: N/A   Imaging Studies ordered:  I ordered imaging studies including CT head, C-spine, plain film left hip I independently visualized and interpreted imaging which showed CT scans unremarkable, plain film was negative I agree with the radiologist interpretation   Cardiac Monitoring:  The patient was maintained on a cardiac monitor.  I personally viewed and interpreted the cardiac monitored which showed an underlying rhythm of: N/A   Medicines ordered and prescription drug management:  I ordered medication including Tdap, hydromorphone I have reviewed the patients home medicines and have made adjustments as needed  Critical Interventions:  N/A   Reevaluation:  Presents after a fall, triage obtain basic imaging, on my exam she is having some left hip pain, will obtain plain film of the hip.  Will update her tetanus shot, and irrigate the wound on her head.   Recommend admission for metabolic encephalopathy secondary to UTI, patient agreed this plan, will consult hospitalist for admission.   Consultations Obtained:  I requested consultation with the spoke with Dr. Robb Matar,  and discussed lab and imaging findings as well as pertinent plan - they recommend: He will admit the patient.    Test Considered:  CT chest abdomen pelvis deferred as my suspicion for thoracic/abdominal trauma is low, no evidence of trauma on my exam, both which are nontender to palpation.    Rule out low suspicion for intracranial head bleed  no focal deficits present on my exam ct head is  negative.  Low suspicion for spinal cord abnormality or spinal fracture spine was palpated was nontender to palpation, patient has full range of motion in the upper  and lower extremities ct c spine is negative.  Low suspicion for orthopedic injury as imaging is negative for acute findings.    Dispostion and problem list  After consideration of the diagnostic results and the patients response to treatment, I feel that the patent would benefit from admission.  Metabolic encephalopathy-secondary due to UTI, patient will need continued monitoring possibly PT OT eval. UTI-start Keflex while in the ER, will have her continue with this regimen.         Final Clinical Impression(s) / ED Diagnoses Final diagnoses:  Fall, initial encounter  Metabolic encephalopathy  Acute cystitis without hematuria    Rx / DC Orders ED Discharge Orders     None         Barnie Del 07/12/23 0719    Palumbo, April, MD 07/12/23 2327

## 2023-07-13 ENCOUNTER — Telehealth: Payer: Self-pay

## 2023-07-13 DIAGNOSIS — G9341 Metabolic encephalopathy: Secondary | ICD-10-CM | POA: Diagnosis not present

## 2023-07-13 LAB — CBC
HCT: 36.4 % (ref 36.0–46.0)
Hemoglobin: 10.5 g/dL — ABNORMAL LOW (ref 12.0–15.0)
MCH: 29.2 pg (ref 26.0–34.0)
MCHC: 28.8 g/dL — ABNORMAL LOW (ref 30.0–36.0)
MCV: 101.4 fL — ABNORMAL HIGH (ref 80.0–100.0)
Platelets: 211 10*3/uL (ref 150–400)
RBC: 3.59 MIL/uL — ABNORMAL LOW (ref 3.87–5.11)
RDW: 12.4 % (ref 11.5–15.5)
WBC: 12.9 10*3/uL — ABNORMAL HIGH (ref 4.0–10.5)
nRBC: 0 % (ref 0.0–0.2)

## 2023-07-13 LAB — COMPREHENSIVE METABOLIC PANEL
ALT: 21 U/L (ref 0–44)
AST: 24 U/L (ref 15–41)
Albumin: 3.6 g/dL (ref 3.5–5.0)
Alkaline Phosphatase: 65 U/L (ref 38–126)
Anion gap: 5 (ref 5–15)
BUN: 18 mg/dL (ref 8–23)
CO2: 43 mmol/L — ABNORMAL HIGH (ref 22–32)
Calcium: 9 mg/dL (ref 8.9–10.3)
Chloride: 88 mmol/L — ABNORMAL LOW (ref 98–111)
Creatinine, Ser: 0.62 mg/dL (ref 0.44–1.00)
GFR, Estimated: >60 mL/min (ref >60)
Glucose, Bld: 102 mg/dL — ABNORMAL HIGH (ref 70–99)
Potassium: 4.9 mmol/L (ref 3.5–5.1)
Sodium: 136 mmol/L (ref 135–145)
Total Bilirubin: 0.6 mg/dL (ref 0.3–1.2)
Total Protein: 6.9 g/dL (ref 6.5–8.1)

## 2023-07-13 LAB — HIV ANTIBODY (ROUTINE TESTING W REFLEX): HIV Screen 4th Generation wRfx: NONREACTIVE

## 2023-07-13 MED ORDER — HYDRALAZINE HCL 20 MG/ML IJ SOLN: 10.0000 mg | Freq: Four times a day (QID) | INTRAMUSCULAR | Status: DC | PRN

## 2023-07-13 MED ORDER — ALPRAZOLAM 0.25 MG PO TABS
0.2500 mg | ORAL_TABLET | Freq: Three times a day (TID) | ORAL | Status: DC | PRN
  Administered 2023-07-13 – 2023-07-14 (×3): 0.25 mg via ORAL
  Filled 2023-07-13 (×3): qty 1

## 2023-07-13 MED ORDER — ENOXAPARIN SODIUM 40 MG/0.4ML IJ SOSY
40.0000 mg | PREFILLED_SYRINGE | Freq: Every day | INTRAMUSCULAR | Status: DC
  Administered 2023-07-13 – 2023-07-14 (×2): 40 mg via SUBCUTANEOUS
  Filled 2023-07-13 (×2): qty 0.4

## 2023-07-13 NOTE — Evaluation (Signed)
Physical Therapy Evaluation Patient Details Name: [name] MRN: [id number] DOB: March 02, 1951 Today's Date: 07/13/2023  History of Present Illness  72 y.o. female with medical history significant of anxiety, atrophic vaginitis, left leg Baker's cyst, unspecified CHF, COPD on home oxygen, heart murmur, hematuria, history of sepsis, history of pyelonephritis, E. coli bacteremia, constipation, hematochezia, hypertension, hyperlipidemia, pulmonary nodules, nicotine addiction, history pneumonia, urinary urgency, urinary retention, vaginal bleeding who was brought via EMS to the emergency department after she tripped, then fell hitting her head on her dresser and sustaining a frontal laceration.  Pt admitted 07/12/23 for acute metabolic encephalopathy.  Clinical Impression  Pt admitted with above diagnosis.  Pt currently with functional limitations due to the deficits listed below (see PT Problem List). Pt will benefit from acute skilled PT to increase their independence and safety with mobility to allow discharge.  Pt attempting to get OOB to use BSC on arrival.  Pt able to transfer without assist.  Pt then ambulated short distance in hallway limited by fatigue and dyspnea.  Pt's SpO2 dropped to 82% on 4L O2 [place] with ambulating in hallway.  Pt also appeared more anxious end of session (however also IV team into room to replace IV) so RN notified.         Assistance Recommended at Discharge PRN  If plan is discharge home, recommend the following:  Can travel by private vehicle           Equipment Recommendations None recommended by PT  Recommendations for Other Services       Functional Status Assessment Patient has had a recent decline in their functional status and demonstrates the ability to make significant improvements in function in a reasonable and predictable amount of time.     Precautions / Restrictions Precautions Precautions: Fall Precaution Comments: recent fall at home (d/t  to medication). montior sats; baseline: rest 3L O2 and increased to 4LO2 with activity      Mobility  Bed Mobility Overal bed mobility: Modified Independent             General bed mobility comments: elevated HOB    Transfers Overall transfer level: Needs assistance Equipment used: None Transfers: Sit to/from Stand, Bed to chair/wheelchair/BSC Sit to Stand: Supervision   Step pivot transfers: Supervision            Ambulation/Gait Ambulation/Gait assistance: Min guard Gait Distance (Feet): 60 Feet Assistive device: None Gait Pattern/deviations: Step-through pattern, Decreased stride length       General Gait Details: no unsteadiness or LOB observed, pt briefly with standing rest break after 30 feet and requested to ambulate back to room, SpO2 93% on 4L O2 [place] however dropped to 82% on 4L upon returning to room, pt provided with cues for pursed lip breathing, briefly increased to 5L to allow pt to recover and then returned to 4L at rest prior to leaving room  (RN notified and aware)  Stairs            Wheelchair Mobility     Tilt Bed    Modified Rankin (Stroke Patients Only)       Balance Overall balance assessment: History of Falls                                           Pertinent Vitals/Pain Pain Assessment Pain Assessment: 0-10 Pain Score: 7  Pain Location: generalized (  from fall per pt) Pain Descriptors / Indicators: Sore, Constant Pain Intervention(s): Monitored during session, Repositioned    Home Living Family/patient expects to be discharged to:: Private residence Living Arrangements: Children (daughter) Available Help at Discharge: Family;Available 24 hours/day (grand daughter will be coming over from [place] today to help) Type of Home: House Home Access: Stairs to enter Entrance Stairs-Rails: Right;Left;Can reach both Entrance Stairs-Number of Steps: 6   Home Layout: One level Home Equipment: Shower  seat;Grab bars - toilet;Grab bars - tub/shower;Rolling Walker (2 wheels) Additional Comments: on 3L of supplemental O2 at all times at home. Uses 4L when out of the home. Daughter has hx of CVA. Pt reports that she is able to help her at home if needed. They both drive    Prior Function Prior Level of Function : Independent/Modified Independent;Driving             Mobility Comments: Independent ADLs Comments: independent     Hand Dominance   Dominant Hand: Right    Extremity/Trunk Assessment        Lower Extremity Assessment Lower Extremity Assessment: Generalized weakness    Cervical / Trunk Assessment Cervical / Trunk Assessment: Normal  Communication   Communication: No difficulties  Cognition Arousal/Alertness: Awake/alert Behavior During Therapy: WFL for tasks assessed/performed Overall Cognitive Status: Within Functional Limits for tasks assessed                                          General Comments      Exercises     Assessment/Plan    PT Assessment Patient needs continued PT services  PT Problem List Decreased activity tolerance;Decreased mobility;Cardiopulmonary status limiting activity       PT Treatment Interventions DME instruction;Gait training;Functional mobility training;Therapeutic activities;Patient/family education;Balance training;Therapeutic exercise    PT Goals (Current goals can be found in the Care Plan section)  Acute Rehab PT Goals PT Goal Formulation: With patient Time For Goal Achievement: 07/27/23 Potential to Achieve Goals: Good    Frequency Min 1X/week     Co-evaluation               AM-PAC PT "6 Clicks" Mobility  Outcome Measure Help needed turning from your back to your side while in a flat bed without using bedrails?: None Help needed moving from lying on your back to sitting on the side of a flat bed without using bedrails?: None Help needed moving to and from a bed to a chair (including a  wheelchair)?: None Help needed standing up from a chair using your arms (e.g., wheelchair or bedside chair)?: None Help needed to walk in hospital room?: A Little Help needed climbing 3-5 steps with a railing? : A Little 6 Click Score: 22    End of Session Equipment Utilized During Treatment: Gait belt Activity Tolerance: Patient tolerated treatment well Patient left: with call bell/phone within reach;in bed Nurse Communication: Mobility status PT Visit Diagnosis: Difficulty in walking, not elsewhere classified (R26.2)    Time: 1610-9604 PT Time Calculation (min) (ACUTE ONLY): 12 min   Charges:   PT Evaluation $PT Eval Low Complexity: 1 Low   PT General Charges $$ ACUTE PT VISIT: 1 Visit       Paulino Door, DPT Physical Therapist Acute Rehabilitation Services Office: [phone]   Janan Halter [place] 07/13/2023, 3:58 PM

## 2023-07-13 NOTE — Evaluation (Signed)
Occupational Therapy Evaluation Patient Details Name: [name] MRN: [id number] DOB: 1951/12/15 Today's Date: 07/13/2023   History of Present Illness 72 y.o. female with medical history significant of  anxiety, atrophic vaginitis, left leg Baker's cyst, unspecified CHF, COPD on home oxygen, heart murmur, hematuria, history of sepsis, history of pyelonephritis, E. coli bacteremia, constipation, hematochezia, hypertension, hyperlipidemia, pulmonary nodules, nicotine addiction, history pneumonia, urinary urgency, urinary retention, vaginal bleeding who was brought via EMS to the emergency department after she tripped, then fell hitting her head on her dresser and sustaining a frontal laceration bled profusely initially.   Clinical Impression   Pt admitted with the above diagnosis. Pt currently with functional limitations due to the deficits listed below (see OT Problem List). Prior to admit, pt was living at home with adult daughter, independent with all ADL/IADL tasks and functional mobility. Pt reports that she started taking new medication which made her dizzy and fall at home hitting her head on dresser. Recommend follow up outpatient pulmonary rehab to work on activity tolerance, endurance, and energy conservation.  Pt will benefit from acute skilled OT to increase their safety and independence with ADL and functional mobility for ADL to facilitate discharge. OT will continue to follow patient acutely.        Recommendations for follow up therapy are one component of a multi-disciplinary discharge planning process, led by the attending physician.  Recommendations may be updated based on patient status, additional functional criteria and insurance authorization.   Assistance Recommended at Discharge PRN  Patient can return home with the following Assistance with cooking/housework;Assist for transportation;Help with stairs or ramp for entrance    Functional Status Assessment  Patient has  had a recent decline in their functional status and demonstrates the ability to make significant improvements in function in a reasonable and predictable amount of time.  Equipment Recommendations  None recommended by OT       Precautions / Restrictions Precautions Precautions: Fall Precaution Comments: recent fall at home (d/t to medication). Watch SpO2! It will drop when OOB. At rest: 3L O2 with activity: 4LO2 Restrictions Weight Bearing Restrictions: No      Mobility Bed Mobility Overal bed mobility: Independent      General bed mobility comments: HOB elevated as pt reports she owns a flat bed and uses pillows to elevate her head. Unable to tolerate a full supine position. No bed rails used.    Transfers Overall transfer level: Needs assistance Equipment used: None Transfers: Sit to/from Stand, Bed to chair/wheelchair/BSC Sit to Stand: Supervision     Step pivot transfers: Supervision     General transfer comment: Provided assist with oxygen tubing for safety. No VC required for sequencing or safety. Able to stand up at bedside and complete full 360 degree turn to sit on BSC. Returned to bed in same fashion.      Balance Overall balance assessment: History of Falls           ADL either performed or assessed with clinical judgement   ADL Overall ADL's : Modified independent     General ADL Comments: Pt is able to complete needed BADL tasks although demonstrates SOB and increased fatigue requiring extended time for rest breaks. Supplemental O2 dependent needing 4L O2 increase during activity.     Vision Baseline Vision/History: 1 Wears glasses (reading) Ability to See in Adequate Light: 0 Adequate Patient Visual Report: No change from baseline Vision Assessment?: No apparent visual deficits  Pertinent Vitals/Pain Pain Assessment Pain Assessment: 0-10 Pain Score: 8  Pain Location: "all over" - head, back, left hip Pain Descriptors / Indicators:  Sore, Constant Pain Intervention(s): Monitored during session, Premedicated before session     Hand Dominance Right   Extremity/Trunk Assessment Upper Extremity Assessment Upper Extremity Assessment: Overall WFL for tasks assessed   Lower Extremity Assessment Lower Extremity Assessment: Generalized weakness   Cervical / Trunk Assessment Cervical / Trunk Assessment: Normal   Communication Communication Communication: No difficulties   Cognition Arousal/Alertness: Awake/alert Behavior During Therapy: WFL for tasks assessed/performed         General Comments  head laceration noted from recent fall. 3L O2 [place] in bed at rest. Once standing at EOB, SpO2 dropped to low 80's/high 70's. Increased to 4L O2 and with increased time, SpO2 increased to 96. Pt back in bed with HOB elevated. Gradually decreased O2 back down to 3L and SpO2 remained stable at 95-96%            Home Living Family/patient expects to be discharged to:: Private residence Living Arrangements: Children (daughter) Available Help at Discharge: Family;Available 24 hours/day (grand daughter will be coming over from [place] today to help) Type of Home: House Home Access: Stairs to enter [employer] of Steps: 6 Entrance Stairs-Rails: Right;Left;Can reach both Home Layout: One level     Bathroom Shower/Tub: Chief Strategy Officer: Handicapped height Bathroom Accessibility: Yes How Accessible: Accessible via walker Home Equipment: Shower seat;Grab bars - toilet;Grab bars - tub/shower   Additional Comments: on 3L of supplemental O2 at all times at home. Uses 4L when out of the home. Daughter has hx of CVA. Pt reports that she is able to help her at home if needed. They both drive      Prior Functioning/Environment Prior Level of Function : Independent/Modified Independent;Driving      Mobility Comments: Independent ADLs Comments: independent        OT Problem List: Decreased  activity tolerance      OT Treatment/Interventions: Self-care/ADL training;Therapeutic exercise;Therapeutic activities;Energy conservation;DME and/or AE instruction;Patient/family education;Balance training;Manual therapy;Modalities    OT Goals(Current goals can be found in the care plan section) Acute Rehab OT Goals Patient Stated Goal: to get stronger and go home OT Goal Formulation: Patient unable to participate in goal setting Time For Goal Achievement: 07/27/23 Potential to Achieve Goals: Good  OT Frequency: Min 1X/week       AM-PAC OT "6 Clicks" Daily Activity     Outcome Measure Help from another person eating meals?: None Help from another person taking care of personal grooming?: None Help from another person toileting, which includes using toliet, bedpan, or urinal?: None Help from another person bathing (including washing, rinsing, drying)?: None Help from another person to put on and taking off regular upper body clothing?: None Help from another person to put on and taking off regular lower body clothing?: None 6 Click Score: 24   End of Session Equipment Utilized During Treatment: Oxygen Nurse Communication: Mobility status;Precautions  Activity Tolerance: Patient limited by fatigue Patient left: in bed;with call bell/phone within reach  OT Visit Diagnosis: History of falling (Z91.81);Muscle weakness (generalized) (M62.81)                Time: 1005-1035 OT Time Calculation (min): 30 min Charges:  OT General Charges $OT Visit: 1 Visit OT Evaluation $OT Eval Moderate Complexity: 1 Mod OT Treatments $Self Care/Home Management : 8-22 mins $Therapeutic Activity: 8-22 mins  [employer], OTR/L,CBIS  Supplemental OT - [facility] and [facility] Secure Chat Preferred    Jerrica Thorman, Charisse March 07/13/2023, 10:47 AM

## 2023-07-13 NOTE — Progress Notes (Signed)
PROGRESS NOTE    [name]  ZOX:[id number] DOB: 09/25/1951 DOA: 07/12/2023 PCP: Benita Stabile, MD   Brief Narrative:  HPI: [name] is a 72 y.o. female with medical history significant of  anxiety, atrophic vaginitis, left leg Baker's cyst, unspecified CHF, COPD on home oxygen, heart murmur, hematuria, history of sepsis, history of pyelonephritis, E. coli bacteremia, constipation, hematochezia, hypertension, hyperlipidemia, pulmonary nodules, nicotine addiction, history pneumonia, urinary urgency, urinary retention, vaginal bleeding who was brought via EMS to the emergency department after she tripped, then fell hitting her head on her dresser and sustaining a frontal laceration bled profusely initially.  Denied any prodromal symptoms.  This seems to have been an accident.  She denied fever, chills, rhinorrhea, sore throat or hemoptysis.  No chest pain, palpitations, diaphoresis, PND, orthopnea or recent pitting edema of the lower extremities.  nausea, emesis, diarrhea, constipation, melena or hematochezia.  Positive dysuria and frequency for the past 5 to 6 days, but no flank pain or hematuria.  No polyuria, polydipsia, polyphagia or blurred vision.  The patient was initially confused, but seems to have improved during the night.   Lab work: CBC showed a white count of 13.2 with 81% neutrophils, hemoglobin 9.8 g associated with an MCV of 104.8 fL and platelets 164.  BMP showed a sodium 136, potassium 3.8, chloride 91 and CO2 38 mmol/L with an anion gap of 7.  Glucose 160 a and calcium 8.3 mg/dL.  Renal function was normal.   Imaging: Portable 1 view chest radiograph with no active disease.  There is volume loss in the right thorax with stable pleural parenchymal scaring in the right lower lobe.  Cardiomegaly.  Left hip x-ray was negative.  CT head without contrast with no evidence of acute intracranial abnormality.  CT cervical spine with no acute fracture or primary bone lesion or focal  pathological process.  Soft tissues and spinal canal with no prevertebral fluid or swelling.  No visible hematoma.  This level showing generalized degenerative endplate and facet sparing.   . ED course: Initial vital signs were temperature 98.5 F, pulse 91, respiration 22, BP 167/68 mmHg O2 sat 94% on nasal cannula oxygen of 4 L/min.  The patient received hydromorphone 0.5 mg IVP and Tdap booster 0.5 mL IM.  Assessment & Plan:   Principal Problem:   Acute metabolic encephalopathy Active Problems:   GERD (gastroesophageal reflux disease)   Hypertension   Anxiety   Urinary tract infection   Hypercholesterolemia   Normocytic anemia   COPD GOLD 4   Acute metabolic encephalopathy In the setting of fall with head trauma, she appears to be much better but still at times she becomes confused.  UTI: Continue Rocephin and follow culture.   GERD: Continue PPI.     Hypertension: Blood pressure slightly elevated, continue losartan and diltiazem and add hydralazine as needed.     Anxiety Continue escitalopram 5 mg p.o. daily. Continue mirtazapine 7.5 mg daily.     Hypercholesterolemia Continue atorvastatin 80 mg p.o. daily.     Normocytic anemia Monitor hematocrit and hemoglobin.     COPD GOLD 4  Continue home supplemental oxygen. Continue Trelegy Ellipta daily. Continue albuterol nebs as needed.  Generalized weakness: Consult PT OT.  DVT prophylaxis: Lovenox   Code Status: Full Code  Family Communication:  None present at bedside.  Plan of care discussed with patient in length and he/she verbalized understanding and agreed with it.  Status is: Inpatient Remains inpatient appropriate because: Still  complains of urinary symptoms   Estimated body mass index is 33.44 kg/m as calculated from the following:   Height as of this encounter: 5\' 3"  (1.6 m).   Weight as of this encounter: 85.6 kg.    Nutritional Assessment: Body mass index is 33.44 kg/m.Marland Kitchen Seen by dietician.  I  agree with the assessment and plan as outlined below: Nutrition Status:        . Skin Assessment: I have examined the patient's skin and I agree with the wound assessment as performed by the wound care RN as outlined below:    Consultants:  None  Procedures:  None  Antimicrobials:  Anti-infectives (From admission, onward)    Start     Dose/Rate Route Frequency Ordered Stop   07/12/23 1100  cefTRIAXone (ROCEPHIN) 1 g in sodium chloride 0.9 % 100 mL IVPB        1 g 200 mL/hr over 30 Minutes Intravenous Every 24 hours 07/12/23 1006           Subjective: Patient seen and examined.  She complains of bladder pressure and says that her last urine void was last night and she feels that her bladder is full.  Objective: Vitals:   07/13/23 0000 07/13/23 0620 07/13/23 0632 07/13/23 0914  BP: (!) 150/68 (!) 177/78 (!) 150/82   Pulse:  97    Resp:  20    Temp:  98 F (36.7 C)    TempSrc:  Oral    SpO2:  98%  96%  Weight:      Height:        Intake/Output Summary (Last 24 hours) at 07/13/2023 1104 Last data filed at 07/13/2023 0846 Gross per 24 hour  Intake 720 ml  Output 1000 ml  Net -280 ml   Filed Weights   07/12/23 0506 07/12/23 0909  Weight: 81.6 kg 85.6 kg    Examination:  General exam: Appears uncomfortable due to bladder pressure. Respiratory system: Clear to auscultation. Respiratory effort normal. Cardiovascular system: S1 & S2 heard, RRR. No JVD, murmurs, rubs, gallops or clicks. No pedal edema. Gastrointestinal system: Abdomen is nondistended, soft and suprapubic tenderness. No organomegaly or masses felt. Normal bowel sounds heard. Central nervous system: Alert and oriented. No focal neurological deficits. Extremities: Symmetric 5 x 5 power. Skin: No rashes, lesions or ulcers  Data Reviewed: I have personally reviewed following labs and imaging studies  CBC: Recent Labs  Lab 07/11/23 1813 07/13/23 0724  WBC 13.2* 12.9*  NEUTROABS 10.8*  --    HGB 9.8* 10.5*  HCT 35.2* 36.4  MCV 104.8* 101.4*  PLT 164 211   Basic Metabolic Panel: Recent Labs  Lab 07/11/23 1813 07/13/23 0724  NA 136 136  K 3.8 4.9  CL 91* 88*  CO2 38* 43*  GLUCOSE 168* 102*  BUN 19 18  CREATININE 0.80 0.62  CALCIUM 8.3* 9.0   GFR: Estimated Creatinine Clearance: 68.8 mL/min (by C-G formula based on SCr of 0.62 mg/dL). Liver Function Tests: Recent Labs  Lab 07/13/23 0724  AST 24  ALT 21  ALKPHOS 65  BILITOT 0.6  PROT 6.9  ALBUMIN 3.6   No results for input(s): "LIPASE", "AMYLASE" in the last 168 hours. No results for input(s): "AMMONIA" in the last 168 hours. Coagulation Profile: No results for input(s): "INR", "PROTIME" in the last 168 hours. Cardiac Enzymes: No results for input(s): "CKTOTAL", "CKMB", "CKMBINDEX", "TROPONINI" in the last 168 hours. BNP (last 3 results) No results for input(s): "PROBNP" in the last  8760 hours. HbA1C: No results for input(s): "HGBA1C" in the last 72 hours. CBG: No results for input(s): "GLUCAP" in the last 168 hours. Lipid Profile: No results for input(s): "CHOL", "HDL", "LDLCALC", "TRIG", "CHOLHDL", "LDLDIRECT" in the last 72 hours. Thyroid Function Tests: No results for input(s): "TSH", "T4TOTAL", "FREET4", "T3FREE", "THYROIDAB" in the last 72 hours. Anemia Panel: No results for input(s): "VITAMINB12", "FOLATE", "FERRITIN", "TIBC", "IRON", "RETICCTPCT" in the last 72 hours. Sepsis Labs: No results for input(s): "PROCALCITON", "LATICACIDVEN" in the last 168 hours.  Recent Results (from the past 240 hour(s))  Urine Culture     Status: Abnormal (Preliminary result)   Collection Time: 07/11/23  9:03 PM   Specimen: Urine, Random  Result Value Ref Range Status   Specimen Description   Final    URINE, RANDOM Performed at [facility], [address], [place], [place] [address]    Special Requests   Final    NONE Reflexed from [id number] Performed at [facility],  [address], [place], [place] [address]    Culture (A)  Final    >=100,000 COLONIES/mL ESCHERICHIA COLI SUSCEPTIBILITIES TO FOLLOW Performed at [facility], [address] [place], [place] [address]    Report Status PENDING  Incomplete     Radiology Studies: DG Hip Unilat W or Wo Pelvis 2-3 Views Left  Result Date: 07/12/2023 CLINICAL DATA:  Fall with left hip pain. EXAM: DG HIP (WITH OR WITHOUT PELVIS) 2-3V LEFT COMPARISON:  Left femur films from 07/07/2023. FINDINGS: No fracture evident. SI joints and symphysis pubis unremarkable. No hip dislocation. AP and frog-leg lateral views of the left hip show no femoral neck fracture. No substantial degenerative change in the left hip. IMPRESSION: Negative. Electronically Signed   By: Kennith Center M.D.   On: 07/12/2023 07:06   CT Head Wo Contrast  Result Date: 07/12/2023 CLINICAL DATA:  Blunt poly trauma. EXAM: CT HEAD WITHOUT CONTRAST CT CERVICAL SPINE WITHOUT CONTRAST TECHNIQUE: Multidetector CT imaging of the head and cervical spine was performed following the standard protocol without intravenous contrast. Multiplanar CT image reconstructions of the cervical spine were also generated. RADIATION DOSE REDUCTION: This exam was performed according to the departmental dose-optimization program which includes automated exposure control, adjustment of the mA and/or kV according to patient size and/or use of iterative reconstruction technique. COMPARISON:  06/30/2022 FINDINGS: CT HEAD FINDINGS Brain: No evidence of acute infarction, hemorrhage, hydrocephalus, extra-axial collection or mass lesion/mass effect. Vascular: No hyperdense vessel or unexpected calcification. Skull: Normal. Negative for fracture or focal lesion. Sinuses/Orbits: No acute finding. CT CERVICAL SPINE FINDINGS Alignment: Normal. Skull base and vertebrae: No acute fracture. No primary bone lesion or focal pathologic process. Soft tissues and spinal canal: No prevertebral fluid  or swelling. No visible canal hematoma. Disc levels:  Generalized degenerative endplate and facet spurring. Upper chest: Opacity and volume loss at the right apex. IMPRESSION: No evidence of acute intracranial or cervical spine injury. Electronically Signed   By: Tiburcio Pea M.D.   On: 07/12/2023 05:55   CT Cervical Spine Wo Contrast  Result Date: 07/12/2023 CLINICAL DATA:  Blunt poly trauma. EXAM: CT HEAD WITHOUT CONTRAST CT CERVICAL SPINE WITHOUT CONTRAST TECHNIQUE: Multidetector CT imaging of the head and cervical spine was performed following the standard protocol without intravenous contrast. Multiplanar CT image reconstructions of the cervical spine were also generated. RADIATION DOSE REDUCTION: This exam was performed according to the departmental dose-optimization program which includes automated exposure control, adjustment of the  mA and/or kV according to patient size and/or use of iterative reconstruction technique. COMPARISON:  06/30/2022 FINDINGS: CT HEAD FINDINGS Brain: No evidence of acute infarction, hemorrhage, hydrocephalus, extra-axial collection or mass lesion/mass effect. Vascular: No hyperdense vessel or unexpected calcification. Skull: Normal. Negative for fracture or focal lesion. Sinuses/Orbits: No acute finding. CT CERVICAL SPINE FINDINGS Alignment: Normal. Skull base and vertebrae: No acute fracture. No primary bone lesion or focal pathologic process. Soft tissues and spinal canal: No prevertebral fluid or swelling. No visible canal hematoma. Disc levels:  Generalized degenerative endplate and facet spurring. Upper chest: Opacity and volume loss at the right apex. IMPRESSION: No evidence of acute intracranial or cervical spine injury. Electronically Signed   By: Tiburcio Pea M.D.   On: 07/12/2023 05:55   DG Chest Port 1 View  Result Date: 07/11/2023 CLINICAL DATA:  Shortness of breath EXAM: PORTABLE CHEST 1 VIEW COMPARISON:  11/03/2022, CT chest 06/30/2023 FINDINGS: Volume  loss right thorax. Pleuroparenchymal scarring in the right lower lung, stable. Enlarged cardiomediastinal silhouette. Aortic atherosclerosis. IMPRESSION: 1. No active disease. 2. Volume loss right thorax with stable pleuroparenchymal scarring in the right lower lung. Cardiomegaly Electronically Signed   By: Jasmine Pang M.D.   On: 07/11/2023 19:26    Scheduled Meds:  aspirin EC  81 mg Oral Daily   atorvastatin  80 mg Oral Daily   bethanechol  10 mg Oral BID   vitamin B-12  1,000 mcg Oral Daily   diltiazem  180 mg Oral Daily   escitalopram  5 mg Oral Daily   fluticasone furoate-vilanterol  1 puff Inhalation Daily   And   umeclidinium bromide  1 puff Inhalation Daily   losartan  50 mg Oral Daily   pantoprazole  20 mg Oral Daily   tamsulosin  0.4 mg Oral QPC supper   Continuous Infusions:  cefTRIAXone (ROCEPHIN)  IV 1 g (07/12/23 1053)     LOS: 1 day   Hughie Closs, MD [facility]  07/13/2023, 11:04 AM   *Please note that this is a verbal dictation therefore any spelling or grammatical errors are due to the "[employer]agon [employer] One" system interpretation.  Please page via Amion and do not message via secure chat for urgent patient care matters. Secure chat can be used for non urgent patient care matters.  How to contact the [facility] Attending or Consulting provider 7A - 7P or covering provider during after hours 7P -7A, for this patient?  Check the care team in [facility] and look for a) attending/consulting [facility] provider listed and b) the [facility] team listed. Page or secure chat 7A-7P. Log into [email or web address] and use [facility]'s universal password to access. If you do not have the password, please contact the hospital operator. Locate the [facility] provider you are looking for under [facility] and page to a number that you can be directly reached. If you still have difficulty reaching the provider, please page the [facility] (Director on Call) for the Hospitalists listed on amion for assistance.

## 2023-07-13 NOTE — Plan of Care (Signed)
?  Problem: Clinical Measurements: ?Goal: Ability to maintain clinical measurements within normal limits will improve ?Outcome: Progressing ?Goal: Will remain free from infection ?Outcome: Progressing ?Goal: Diagnostic test results will improve ?Outcome: Progressing ?  ?

## 2023-07-13 NOTE — Progress Notes (Signed)
PT Cancellation Note  Patient Details Name: [name] MRN: [id number] DOB: 1951/03/12   Cancelled Treatment:    Reason Eval/Treat Not Completed: Other (comment) Pt requesting to eat lunch first.  Will check back as schedule permits.   Janan Halter [place] 07/13/2023, 11:43 AM Paulino Door, DPT Physical Therapist Acute Rehabilitation Services Office: [phone]

## 2023-07-14 ENCOUNTER — Other Ambulatory Visit (HOSPITAL_COMMUNITY): Payer: Self-pay

## 2023-07-14 DIAGNOSIS — G9341 Metabolic encephalopathy: Secondary | ICD-10-CM | POA: Diagnosis not present

## 2023-07-14 LAB — CBC WITH DIFFERENTIAL/PLATELET
Abs Immature Granulocytes: 0.06 10*3/uL (ref 0.00–0.07)
Basophils Absolute: 0 10*3/uL (ref 0.0–0.1)
Basophils Relative: 0 %
Eosinophils Absolute: 0.1 10*3/uL (ref 0.0–0.5)
Eosinophils Relative: 1 %
HCT: 38 % (ref 36.0–46.0)
Hemoglobin: 10.6 g/dL — ABNORMAL LOW (ref 12.0–15.0)
Immature Granulocytes: 1 %
Lymphocytes Relative: 16 %
Lymphs Abs: 1.5 10*3/uL (ref 0.7–4.0)
MCH: 28.9 pg (ref 26.0–34.0)
MCHC: 27.9 g/dL — ABNORMAL LOW (ref 30.0–36.0)
MCV: 103.5 fL — ABNORMAL HIGH (ref 80.0–100.0)
Monocytes Absolute: 1.2 10*3/uL — ABNORMAL HIGH (ref 0.1–1.0)
Monocytes Relative: 13 %
Neutro Abs: 6.2 10*3/uL (ref 1.7–7.7)
Neutrophils Relative %: 69 %
Platelets: 202 10*3/uL (ref 150–400)
RBC: 3.67 MIL/uL — ABNORMAL LOW (ref 3.87–5.11)
RDW: 12.5 % (ref 11.5–15.5)
WBC: 9 10*3/uL (ref 4.0–10.5)
nRBC: 0 % (ref 0.0–0.2)

## 2023-07-14 LAB — MAGNESIUM: Magnesium: 2.3 mg/dL (ref 1.7–2.4)

## 2023-07-14 LAB — BASIC METABOLIC PANEL
Anion gap: 11 (ref 5–15)
BUN: 16 mg/dL (ref 8–23)
CO2: 37 mmol/L — ABNORMAL HIGH (ref 22–32)
Calcium: 8.6 mg/dL — ABNORMAL LOW (ref 8.9–10.3)
Chloride: 90 mmol/L — ABNORMAL LOW (ref 98–111)
Creatinine, Ser: 0.6 mg/dL (ref 0.44–1.00)
GFR, Estimated: >60 mL/min (ref >60)
Glucose, Bld: 97 mg/dL (ref 70–99)
Potassium: 4.2 mmol/L (ref 3.5–5.1)
Sodium: 138 mmol/L (ref 135–145)

## 2023-07-14 LAB — URINE CULTURE: Culture: >=100000 — AB

## 2023-07-14 LAB — PHOSPHORUS: Phosphorus: 4.7 mg/dL — ABNORMAL HIGH (ref 2.5–4.6)

## 2023-07-14 MED ORDER — NITROFURANTOIN MONOHYD MACRO 100 MG PO CAPS
100.0000 mg | ORAL_CAPSULE | Freq: Two times a day (BID) | ORAL | 0 refills | Status: AC
  Filled 2023-07-14: qty 10, 5d supply, fill #0

## 2023-07-14 NOTE — Progress Notes (Signed)
Pt was eating lunch, RT unable to administer Breo, Incruse DPIs at this time. Will recheck.

## 2023-07-14 NOTE — Progress Notes (Signed)
Spoke with pt and notified of results per Dr. Wert. Pt verbalized understanding and denied any questions. 

## 2023-07-14 NOTE — Discharge Summary (Signed)
Physician Discharge Summary  [name] VQQ:[id number] DOB: [birth year] DOA: 07/12/2023  PCP: Benita Stabile, MD  Admit date: 07/12/2023 Discharge date: 07/14/2023 30 Day Unplanned Readmission Risk Score    Flowsheet Row ED to Hosp-Admission (Current) from 07/12/2023 in [facility]TH FLOOR PROGRESSIVE C[facility] AND UROLOGY  30 Day Unplanned Readmission Risk Score (%) 21.32 Filed at 07/14/2023 0801       This score is the patient's risk of an unplanned readmission within 30 days of being discharged (0 -100%). The score is based on dignosis, age, lab data, medications, orders, and past utilization.   Low:  0-14.9   Medium: 15-21.9   High: 22-29.9   Extreme: 30 and above          Admitted From: home Disposition:  home  Recommendations for Outpatient Follow-up:  Follow up with PCP in 1-2 weeks Please obtain BMP/CBC in one week Please follow up with your PCP on the following pending results: Unresulted Labs (From admission, onward)    None         Home Health:none  Equipment/Devices:none   Discharge Condition:stable  CODE STATUS:full code  Diet recommendation: cardiac  HPI: [name] is a 72 y.o. female with medical history significant of  anxiety, atrophic vaginitis, left leg Baker's cyst, unspecified CHF, COPD on home oxygen, heart murmur, hematuria, history of sepsis, history of pyelonephritis, E. coli bacteremia, constipation, hematochezia, hypertension, hyperlipidemia, pulmonary nodules, nicotine addiction, history pneumonia, urinary urgency, urinary retention, vaginal bleeding who was brought via EMS to the emergency department after she tripped, then fell hitting her head on her dresser and sustaining a frontal laceration bled profusely initially.  Denied any prodromal symptoms.  This seems to have been an accident.  She denied fever, chills, rhinorrhea, sore throat or hemoptysis.  No chest pain, palpitations, diaphoresis, PND, orthopnea or recent pitting edema of the  lower extremities.  nausea, emesis, diarrhea, constipation, melena or hematochezia.  Positive dysuria and frequency for the past 5 to 6 days, but no flank pain or hematuria.  No polyuria, polydipsia, polyphagia or blurred vision.  The patient was initially confused, but seems to have improved during the night.   Lab work: CBC showed a white count of 13.2 with 81% neutrophils, hemoglobin 9.8 g associated with an MCV of 104.8 fL and platelets 164.  BMP showed a sodium 136, potassium 3.8, chloride 91 and CO2 38 mmol/L with an anion gap of 7.  Glucose 160 a and calcium 8.3 mg/dL.  Renal function was normal.   Imaging: Portable 1 view chest radiograph with no active disease.  There is volume loss in the right thorax with stable pleural parenchymal scaring in the right lower lobe.  Cardiomegaly.  Left hip x-ray was negative.  CT head without contrast with no evidence of acute intracranial abnormality.  CT cervical spine with no acute fracture or primary bone lesion or focal pathological process.  Soft tissues and spinal canal with no prevertebral fluid or swelling.  No visible hematoma.  This level showing generalized degenerative endplate and facet sparing.   . ED course: Initial vital signs were temperature 98.5 F, pulse 91, respiration 22, BP 167/68 mmHg O2 sat 94% on nasal cannula oxygen of 4 L/min.  The patient received hydromorphone 0.5 mg IVP and Tdap booster 0.5 mL IM.  Subjective:seen and examined. Feels better. Still anxious and apprehensive about going home but is agreeable after counseling.   Brief/Interim Summary:  Acute metabolic encephalopathy In the setting of fall with  head trauma, resolved and back at baseline.    UTI: started on rocephin, urine cx growing e coli, mostly sensitive, received 2 days of rocephin, will dc on nitrofurantoin for 5 days.    GERD: Continue PPI.     Hypertension: resume losartan and diltiazem.      Anxiety Continue escitalopram 5 mg p.o. daily. Continue  mirtazapine 7.5 mg daily.     Hypercholesterolemia Continue atorvastatin 80 mg p.o. daily.     Normocytic anemia Monitor hematocrit and hemoglobin.     COPD GOLD 4  Continue home supplemental oxygen. Continue Trelegy Ellipta daily. Continue albuterol nebs as needed.   Generalized weakness: seen by PT/OT, no HHPT/OT was recommended.   Discharge plan was discussed with patient and/or family member and they verbalized understanding and agreed with it.  Discharge Diagnoses:  Principal Problem:   Acute metabolic encephalopathy Active Problems:   GERD (gastroesophageal reflux disease)   Hypertension   Anxiety   Urinary tract infection   Hypercholesterolemia   Normocytic anemia   COPD GOLD 4     Discharge Instructions   Allergies as of 07/14/2023   No Known Allergies      Medication List     STOP taking these medications    cephALEXin 500 MG capsule Commonly known as: KEFLEX   gabapentin 300 MG capsule Commonly known as: Neurontin   predniSONE 10 MG tablet Commonly known as: DELTASONE       TAKE these medications    albuterol (2.5 MG/3ML) 0.083% nebulizer solution Commonly known as: PROVENTIL Take 2.5 mg by nebulization every 4 (four) hours as needed for wheezing or shortness of breath.   albuterol 108 (90 Base) MCG/ACT inhaler Commonly known as: VENTOLIN HFA Inhale 2 puffs into the lungs every 6 (six) hours as needed for wheezing or shortness of breath. For shortness of breath   ALPRAZolam 0.25 MG tablet Commonly known as: XANAX Take 0.25 mg by mouth 2 (two) times daily as needed for anxiety.   aspirin EC 81 MG tablet Take 81 mg by mouth in the morning. Swallow whole.   atorvastatin 80 MG tablet Commonly known as: LIPITOR Take 1 tablet (80 mg total) by mouth daily.   bethanechol 10 MG tablet Commonly known as: URECHOLINE Take 1 tablet (10 mg total) by mouth in the morning and at bedtime.   diltiazem 180 MG 24 hr capsule Commonly known as:  CARDIZEM CD Take 1 capsule (180 mg total) by mouth daily.   escitalopram 5 MG tablet Commonly known as: LEXAPRO Take 5 mg by mouth daily.   furosemide 20 MG tablet Commonly known as: LASIX Take 1 tablet (20 mg total) by mouth daily. What changed:  when to take this reasons to take this   HYDROcodone-acetaminophen 10-325 MG tablet Commonly known as: NORCO Take 1 tablet by mouth 4 (four) times daily as needed for moderate pain.   losartan 50 MG tablet Commonly known as: COZAAR Take 50 mg by mouth daily.   melatonin 5 MG Tabs Take 1 tablet (5 mg total) by mouth once as needed.   mirtazapine 7.5 MG tablet Commonly known as: REMERON Take 7.5 mg by mouth at bedtime as needed (Sleep).   multivitamin with minerals Tabs tablet Take 1 tablet by mouth in the morning.   nitrofurantoin (macrocrystal-monohydrate) 100 MG capsule Commonly known as: Macrobid Take 1 capsule (100 mg total) by mouth 2 (two) times daily for 5 days.   pantoprazole 20 MG tablet Commonly known as: PROTONIX Take 1 tablet (  20 mg total) by mouth daily.   tamsulosin 0.4 MG Caps capsule Commonly known as: FLOMAX Take 1 capsule (0.4 mg total) by mouth daily after supper.   Trelegy Ellipta 100-62.5-25 MCG/INH Aepb Generic drug: Fluticasone-Umeclidin-Vilant Take 1 puff by mouth in the morning.   Vitamin B-12 CR 1000 MCG Tbcr Take 1 tablet by mouth daily.        Follow-up Information     Benita Stabile, MD Follow up in 1 week(s).   Specialty: Internal Medicine Contact information: [address] [place] [address] [phone]                No Known Allergies  Consultations: none   Procedures/Studies: DG Hip Unilat W or Wo Pelvis 2-3 Views Left  Result Date: 07/12/2023 CLINICAL DATA:  Fall with left hip pain. EXAM: DG HIP (WITH OR WITHOUT PELVIS) 2-3V LEFT COMPARISON:  Left femur films from 07/07/2023. FINDINGS: No fracture evident. SI joints and symphysis pubis unremarkable. No  hip dislocation. AP and frog-leg lateral views of the left hip show no femoral neck fracture. No substantial degenerative change in the left hip. IMPRESSION: Negative. Electronically Signed   By: Kennith Center M.D.   On: 07/12/2023 07:06   CT Head Wo Contrast  Result Date: 07/12/2023 CLINICAL DATA:  Blunt poly trauma. EXAM: CT HEAD WITHOUT CONTRAST CT CERVICAL SPINE WITHOUT CONTRAST TECHNIQUE: Multidetector CT imaging of the head and cervical spine was performed following the standard protocol without intravenous contrast. Multiplanar CT image reconstructions of the cervical spine were also generated. RADIATION DOSE REDUCTION: This exam was performed according to the departmental dose-optimization program which includes automated exposure control, adjustment of the mA and/or kV according to patient size and/or use of iterative reconstruction technique. COMPARISON:  06/30/2022 FINDINGS: CT HEAD FINDINGS Brain: No evidence of acute infarction, hemorrhage, hydrocephalus, extra-axial collection or mass lesion/mass effect. Vascular: No hyperdense vessel or unexpected calcification. Skull: Normal. Negative for fracture or focal lesion. Sinuses/Orbits: No acute finding. CT CERVICAL SPINE FINDINGS Alignment: Normal. Skull base and vertebrae: No acute fracture. No primary bone lesion or focal pathologic process. Soft tissues and spinal canal: No prevertebral fluid or swelling. No visible canal hematoma. Disc levels:  Generalized degenerative endplate and facet spurring. Upper chest: Opacity and volume loss at the right apex. IMPRESSION: No evidence of acute intracranial or cervical spine injury. Electronically Signed   By: Tiburcio Pea M.D.   On: 07/12/2023 05:55   CT Cervical Spine Wo Contrast  Result Date: 07/12/2023 CLINICAL DATA:  Blunt poly trauma. EXAM: CT HEAD WITHOUT CONTRAST CT CERVICAL SPINE WITHOUT CONTRAST TECHNIQUE: Multidetector CT imaging of the head and cervical spine was performed following the  standard protocol without intravenous contrast. Multiplanar CT image reconstructions of the cervical spine were also generated. RADIATION DOSE REDUCTION: This exam was performed according to the departmental dose-optimization program which includes automated exposure control, adjustment of the mA and/or kV according to patient size and/or use of iterative reconstruction technique. COMPARISON:  06/30/2022 FINDINGS: CT HEAD FINDINGS Brain: No evidence of acute infarction, hemorrhage, hydrocephalus, extra-axial collection or mass lesion/mass effect. Vascular: No hyperdense vessel or unexpected calcification. Skull: Normal. Negative for fracture or focal lesion. Sinuses/Orbits: No acute finding. CT CERVICAL SPINE FINDINGS Alignment: Normal. Skull base and vertebrae: No acute fracture. No primary bone lesion or focal pathologic process. Soft tissues and spinal canal: No prevertebral fluid or swelling. No visible canal hematoma. Disc levels:  Generalized degenerative endplate and facet spurring. Upper chest: Opacity and  volume loss at the right apex. IMPRESSION: No evidence of acute intracranial or cervical spine injury. Electronically Signed   By: Tiburcio Pea M.D.   On: 07/12/2023 05:55   DG Chest Port 1 View  Result Date: 07/11/2023 CLINICAL DATA:  Shortness of breath EXAM: PORTABLE CHEST 1 VIEW COMPARISON:  11/03/2022, CT chest 06/30/2023 FINDINGS: Volume loss right thorax. Pleuroparenchymal scarring in the right lower lung, stable. Enlarged cardiomediastinal silhouette. Aortic atherosclerosis. IMPRESSION: 1. No active disease. 2. Volume loss right thorax with stable pleuroparenchymal scarring in the right lower lung. Cardiomegaly Electronically Signed   By: Jasmine Pang M.D.   On: 07/11/2023 19:26   DG FEMUR MIN 2 VIEWS LEFT  Result Date: 07/07/2023 CLINICAL DATA:  Leg pain, warmth EXAM: LEFT FEMUR 2 VIEWS COMPARISON:  None Available. FINDINGS: There is no evidence of fracture or other focal bone lesions.  Hip joint appears within normal limits. Tricompartmental osteoarthritis of the left knee. Soft tissues are unremarkable. IMPRESSION: 1. No acute fracture or dislocation of the left femur. 2. Tricompartmental osteoarthritis of the left knee. Electronically Signed   By: Duanne Guess D.O.   On: 07/07/2023 15:01   US Venous Img Lower Unilateral Left  Result Date: 07/07/2023 CLINICAL DATA:  Left lateral mid thigh pain and burning for 2 days EXAM: LEFT LOWER EXTREMITY VENOUS DOPPLER ULTRASOUND TECHNIQUE: Gray-scale sonography with compression, as well as color and duplex ultrasound, were performed to evaluate the deep venous system(s) from the level of the common femoral vein through the popliteal and proximal calf veins. COMPARISON:  None available FINDINGS: VENOUS Normal compressibility of the common femoral, superficial femoral, and popliteal veins, as well as the visualized calf veins. Visualized portions of profunda femoral vein and great saphenous vein unremarkable. No filling defects to suggest DVT on grayscale or color Doppler imaging. Doppler waveforms show normal direction of venous flow, normal respiratory plasticity and response to augmentation. Limited views of the contralateral common femoral vein are unremarkable. OTHER No discrete sonographic abnormality of the area of pain in the left lateral hip. Limitations: none IMPRESSION: No DVT of the left lower extremity. Electronically Signed   By: Acquanetta Belling M.D.   On: 07/07/2023 14:28   CT Chest Wo Contrast  Result Date: 07/07/2023 CLINICAL DATA:  Follow-up pulmonary nodule EXAM: CT CHEST WITHOUT CONTRAST TECHNIQUE: Multidetector CT imaging of the chest was performed following the standard protocol without IV contrast. RADIATION DOSE REDUCTION: This exam was performed according to the departmental dose-optimization program which includes automated exposure control, adjustment of the mA and/or kV according to patient size and/or use of iterative  reconstruction technique. COMPARISON:  CT of the chest 12/01/2022 FINDINGS: Cardiovascular: No significant vascular findings. Normal heart size. No pericardial effusion. There are atherosclerotic calcifications of the aorta. Mediastinum/Nodes: There is a mildly enlarged subcarinal lymph node measuring 10 mm, unchanged. No other enlarged lymph nodes are seen. Visualized esophagus and thyroid gland are within normal limits. Lungs/Pleura: Mild emphysematous changes are again seen. Airspace disease and bronchiectasis in the inferior right lower lobe has mildly increased. There is a new small amount of focal airspace disease in the inferior anterior left lower lobe. Again seen is scarring in the right lung apex. There is a new ill-defined nodular density in the left upper lobe measuring 5 mm image 4/68. There is a 6 mm left upper lobe nodule image 4/49 which has decreased in size. There is a new nodular airspace opacity in the left upper lobe measuring 12 by 9 mm image  4/49. Previously identified right upper lobe nodule measuring 13 mm has completely resolved in the interval. There is right pleural thickening and calcification, unchanged. There is no pleural effusion or pneumothorax. Upper Abdomen: No acute abnormality. Musculoskeletal: No chest wall mass or suspicious bone lesions identified. IMPRESSION: 1. Increasing focal airspace disease in the right lower lobe with new small amount of airspace disease in the left lower lobe worrisome for multifocal pneumonia. 2. Previously identified right upper lobe nodule has completely resolved. 3. There are new nodular airspace opacities in the left upper lobe measuring up to 12 mm. Findings may be infectious/inflammatory, but neoplasm can not be excluded. 4. Stable mildly enlarged subcarinal lymph node. 5. Short-term follow-up CT recommended in 2-3 months to re-evaluate. Aortic Atherosclerosis (ICD10-I70.0) and Emphysema (ICD10-J43.9). Electronically Signed   By: Darliss Cheney  M.D.   On: 07/07/2023 00:05   CT ABDOMEN PELVIS W CONTRAST  Result Date: 07/05/2023 CLINICAL DATA:  Right-sided abdominal pain EXAM: CT ABDOMEN AND PELVIS WITH CONTRAST TECHNIQUE: Multidetector CT imaging of the abdomen and pelvis was performed using the standard protocol following bolus administration of intravenous contrast. RADIATION DOSE REDUCTION: This exam was performed according to the departmental dose-optimization program which includes automated exposure control, adjustment of the mA and/or kV according to patient size and/or use of iterative reconstruction technique. CONTRAST:  OMNIPAQUE IOHEXOL 300 MG/ML  SOLN COMPARISON:  CT 06/30/2022, chest CT 12/01/2022, 06/25/2020 FINDINGS: Lower chest: See separately dictated chest CT. Chronic pleural and parenchymal disease at the right base. New area of consolidation and ground-glass disease at the left lung base. Hepatobiliary: Hepatic steatosis. No calcified gallstone or biliary dilatation Pancreas: Unremarkable. No pancreatic ductal dilatation or surrounding inflammatory changes. Spleen: Normal in size without focal abnormality. Adrenals/Urinary Tract: Right adrenal gland is normal. Stable left adrenal gland nodule, consistent with adenoma. No imaging follow-up is recommended. Kidneys show no hydronephrosis. Mild slightly asymmetric bladder wall thickening with perivesical stranding. Stomach/Bowel: Stomach nonenlarged. No dilated small bowel. No acute bowel wall thickening. Diverticular disease of the left colon. Negative appendix Vascular/Lymphatic: Moderate severe aortic atherosclerosis. No aneurysm. No suspicious lymph nodes. Reproductive: Hysterectomy. No adnexal mass. Small volume gas in the vagina. Other: Negative for pelvic effusion or free air. Musculoskeletal: No acute or suspicious osseous abnormality. IMPRESSION: 1. Mild slightly asymmetric bladder wall thickening with perivesical stranding, question cystitis. Correlate with urinalysis. 2.  Hepatic steatosis. 3. Diverticular disease of the left colon without acute bowel wall thickening. 4. New area of nodular consolidation and ground-glass disease at the left lung base, see separately dictated chest CT 5. Aortic atherosclerosis. Aortic Atherosclerosis (ICD10-I70.0). Electronically Signed   By: Jasmine Pang M.D.   On: 07/05/2023 17:52     Discharge Exam: Vitals:   07/13/23 2230 07/14/23 0500  BP: 137/63 (!) 163/86  Pulse: 87 99  Resp:    Temp:  98.1 F (36.7 C)  SpO2: 93% 99%   Vitals:   07/13/23 1137 07/13/23 2027 07/13/23 2230 07/14/23 0500  BP: (!) 164/69 (!) 159/64 137/63 (!) 163/86  Pulse: 89 87 87 99  Resp: 19 20    Temp: 98.5 F (36.9 C) 98 F (36.7 C)  98.1 F (36.7 C)  TempSrc: Oral Oral  Oral  SpO2: 96% 99% 93% 99%  Weight:      Height:        General: Pt is alert, awake, not in acute distress Cardiovascular: RRR, S1/S2 +, no rubs, no gallops Respiratory: CTA bilaterally, no wheezing, no rhonchi Abdominal: Soft, NT,  ND, bowel sounds + Extremities: no edema, no cyanosis    The results of significant diagnostics from this hospitalization (including imaging, microbiology, ancillary and laboratory) are listed below for reference.     Microbiology: Recent Results (from the past 240 hour(s))  Urine Culture     Status: Abnormal   Collection Time: 07/11/23  9:03 PM   Specimen: Urine, Random  Result Value Ref Range Status   Specimen Description   Final    URINE, RANDOM Performed at [facility], [address] [place], [place] [address]    Special Requests   Final    NONE Reflexed from [id number] Performed at [facility], [address] [place], [place] [address]    Culture >=100,000 COLONIES/mL ESCHERICHIA COLI (A)  Final   Report Status 07/14/2023 FINAL  Final   Organism ID, Bacteria ESCHERICHIA COLI (A)  Final      Susceptibility   Escherichia coli - MIC*    AMPICILLIN >=32 RESISTANT Resistant      CEFAZOLIN 16 SENSITIVE Sensitive     CEFEPIME <=0.12 SENSITIVE Sensitive     CEFTRIAXONE 0.5 SENSITIVE Sensitive     CIPROFLOXACIN <=0.25 SENSITIVE Sensitive     GENTAMICIN <=1 SENSITIVE Sensitive     IMIPENEM <=0.25 SENSITIVE Sensitive     NITROFURANTOIN <=16 SENSITIVE Sensitive     TRIMETH/SULFA <=20 SENSITIVE Sensitive     AMPICILLIN/SULBACTAM >=32 RESISTANT Resistant     PIP/TAZO 8 SENSITIVE Sensitive     * >=100,000 COLONIES/mL ESCHERICHIA COLI     Labs: BNP (last 3 results) No results for input(s): "BNP" in the last 8760 hours. Basic Metabolic Panel: Recent Labs  Lab 07/11/23 1813 07/13/23 0724 07/14/23 0043  NA 136 136 138  K 3.8 4.9 4.2  CL 91* 88* 90*  CO2 38* 43* 37*  GLUCOSE 168* 102* 97  BUN 19 18 16   CREATININE 0.80 0.62 0.60  CALCIUM 8.3* 9.0 8.6*  MG  --   --  2.3  PHOS  --   --  4.7*   Liver Function Tests: Recent Labs  Lab 07/13/23 0724  AST 24  ALT 21  ALKPHOS 65  BILITOT 0.6  PROT 6.9  ALBUMIN 3.6   No results for input(s): "LIPASE", "AMYLASE" in the last 168 hours. No results for input(s): "AMMONIA" in the last 168 hours. CBC: Recent Labs  Lab 07/11/23 1813 07/13/23 0724 07/14/23 0043  WBC 13.2* 12.9* 9.0  NEUTROABS 10.8*  --  6.2  HGB 9.8* 10.5* 10.6*  HCT 35.2* 36.4 38.0  MCV 104.8* 101.4* 103.5*  PLT 164 211 202   Cardiac Enzymes: No results for input(s): "CKTOTAL", "CKMB", "CKMBINDEX", "TROPONINI" in the last 168 hours. BNP: Invalid input(s): "POCBNP" CBG: No results for input(s): "GLUCAP" in the last 168 hours. D-Dimer No results for input(s): "DDIMER" in the last 72 hours. Hgb A1c No results for input(s): "HGBA1C" in the last 72 hours. Lipid Profile No results for input(s): "CHOL", "HDL", "LDLCALC", "TRIG", "CHOLHDL", "LDLDIRECT" in the last 72 hours. Thyroid function studies No results for input(s): "TSH", "T4TOTAL", "T3FREE", "THYROIDAB" in the last 72 hours.  Invalid input(s): "FREET3" Anemia work up No results  for input(s): "VITAMINB12", "FOLATE", "FERRITIN", "TIBC", "IRON", "RETICCTPCT" in the last 72 hours. Urinalysis    Component Value Date/Time   COLORURINE YELLOW 07/11/2023 2103   APPEARANCEUR CLOUDY (A) 07/11/2023 2103   APPEARANCEUR Clear 08/08/2022 0846   LABSPEC 1.013 07/11/2023 2103   PHURINE 5.0 07/11/2023 2103   GLUCOSEU NEGATIVE  07/11/2023 2103   HGBUR NEGATIVE 07/11/2023 2103   BILIRUBINUR NEGATIVE 07/11/2023 2103   BILIRUBINUR Negative 08/08/2022 0846   KETONESUR NEGATIVE 07/11/2023 2103   PROTEINUR 30 (A) 07/11/2023 2103   UROBILINOGEN 0.2 08/17/2011 1302   NITRITE NEGATIVE 07/11/2023 2103   LEUKOCYTESUR LARGE (A) 07/11/2023 2103   Sepsis Labs Recent Labs  Lab 07/11/23 1813 07/13/23 0724 07/14/23 0043  WBC 13.2* 12.9* 9.0   Microbiology Recent Results (from the past 240 hour(s))  Urine Culture     Status: Abnormal   Collection Time: 07/11/23  9:03 PM   Specimen: Urine, Random  Result Value Ref Range Status   Specimen Description   Final    URINE, RANDOM Performed at [facility], [address], [place], [place] [address]    Special Requests   Final    NONE Reflexed from [id number] Performed at [facility], [address] [place], [place] [address]    Culture >=100,000 COLONIES/mL ESCHERICHIA COLI (A)  Final   Report Status 07/14/2023 FINAL  Final   Organism ID, Bacteria ESCHERICHIA COLI (A)  Final      Susceptibility   Escherichia coli - MIC*    AMPICILLIN >=32 RESISTANT Resistant     CEFAZOLIN 16 SENSITIVE Sensitive     CEFEPIME <=0.12 SENSITIVE Sensitive     CEFTRIAXONE 0.5 SENSITIVE Sensitive     CIPROFLOXACIN <=0.25 SENSITIVE Sensitive     GENTAMICIN <=1 SENSITIVE Sensitive     IMIPENEM <=0.25 SENSITIVE Sensitive     NITROFURANTOIN <=16 SENSITIVE Sensitive     TRIMETH/SULFA <=20 SENSITIVE Sensitive     AMPICILLIN/SULBACTAM >=32 RESISTANT Resistant     PIP/TAZO 8 SENSITIVE Sensitive     * >=100,000 COLONIES/mL  ESCHERICHIA COLI    FURTHER DISCHARGE INSTRUCTIONS:   Get Medicines reviewed and adjusted: Please take all your medications with you for your next visit with your Primary MD   Laboratory/radiological data: Please request your Primary MD to go over all hospital tests and procedure/radiological results at the follow up, please ask your Primary MD to get all Hospital records sent to his/her office.   In some cases, they will be blood work, cultures and biopsy results pending at the time of your discharge. Please request that your primary care M.D. goes through all the records of your hospital data and follows up on these results.   Also Note the following: If you experience worsening of your admission symptoms, develop shortness of breath, life threatening emergency, suicidal or homicidal thoughts you must seek medical attention immediately by calling 911 or calling your MD immediately  if symptoms less severe.   You must read complete instructions/literature along with all the possible adverse reactions/side effects for all the Medicines you take and that have been prescribed to you. Take any new Medicines after you have completely understood and accpet all the possible adverse reactions/side effects.    Do not drive when taking Pain medications or sleeping medications (Benzodaizepines)   Do not take more than prescribed Pain, Sleep and Anxiety Medications. It is not advisable to combine anxiety,sleep and pain medications without talking with your primary care practitioner   Special Instructions: If you have smoked or chewed Tobacco  in the last 2 yrs please stop smoking, stop any regular Alcohol  and or any Recreational drug use.   Wear Seat belts while driving.   Please note: You were cared for by a hospitalist during your hospital stay. Once you are discharged, your primary care  physician will handle any further medical issues. Please note that NO REFILLS for any discharge medications  will be authorized once you are discharged, as it is imperative that you return to your primary care physician (or establish a relationship with a primary care physician if you do not have one) for your post hospital discharge needs so that they can reassess your need for medications and monitor your lab values  Time coordinating discharge: Over 30 minutes  SIGNED:   Hughie Closs, MD  [facility] 07/14/2023, 9:07 AM *Please note that this is a verbal dictation therefore any spelling or grammatical errors are due to the "Dragon [employer] One" system interpretation. If 7PM-7AM, please contact night-coverage [email or web address]

## 2023-07-16 ENCOUNTER — Telehealth (HOSPITAL_BASED_OUTPATIENT_CLINIC_OR_DEPARTMENT_OTHER): Payer: Self-pay | Admitting: *Deleted

## 2023-07-16 NOTE — Telephone Encounter (Signed)
Post ED Visit - Positive Culture Follow-up  Culture report reviewed by antimicrobial stewardship pharmacist: [facility] []  Enzo Bi, Pharm.D. []  Celedonio Miyamoto, Pharm.D., BCPS AQ-ID []  Garvin Fila, Pharm.D., BCPS []  Georgina Pillion, Pharm.D., BCPS []  [place], [address].D., BCPS, AAHIVP []  Estella Husk, Pharm.D., BCPS, AAHIVP []  Lysle Pearl, PharmD, BCPS []  Phillips Climes, PharmD, BCPS []  Agapito Games, PharmD, BCPS []  Verlan Friends, PharmD []  Mervyn Gay, PharmD, BCPS []  Vinnie Level, PharmD  [facility] []  Len Childs, PharmD []  Greer Pickerel, PharmD []  Adalberto Cole, PharmD []  Perlie Gold, Rph []  Lonell Face) Jean Rosenthal, PharmD []  Earl Many, PharmD []  Junita Push, PharmD [x]  Dorna Leitz, PharmD []  Terrilee Files, PharmD []  Lynann Beaver, PharmD []  Keturah Barre, PharmD []  Loralee Pacas, PharmD []  Bernadene Person, PharmD   Positive urine culture Treated with Cephalexin, organism sensitive to the same and no further patient follow-up is required at this time.  [name] 07/16/2023, 8:41 AM

## 2023-07-19 ENCOUNTER — Emergency Department (HOSPITAL_COMMUNITY): Payer: Medicare Other

## 2023-07-19 ENCOUNTER — Inpatient Hospital Stay (HOSPITAL_COMMUNITY): Payer: Medicare Other

## 2023-07-19 ENCOUNTER — Encounter (HOSPITAL_COMMUNITY): Payer: Self-pay

## 2023-07-19 ENCOUNTER — Other Ambulatory Visit: Payer: Self-pay

## 2023-07-19 ENCOUNTER — Inpatient Hospital Stay (HOSPITAL_COMMUNITY)
Admission: EM | Admit: 2023-07-19 | Discharge: 2023-07-24 | DRG: 190 | Disposition: A | Discharge status: Home Health | Payer: Medicare Other | Attending: Obstetrics and Gynecology | Admitting: Obstetrics and Gynecology

## 2023-07-19 DIAGNOSIS — I110 Hypertensive heart disease with heart failure: Secondary | ICD-10-CM | POA: Diagnosis not present

## 2023-07-19 DIAGNOSIS — R7989 Other specified abnormal findings of blood chemistry: Secondary | ICD-10-CM | POA: Diagnosis not present

## 2023-07-19 DIAGNOSIS — Z7951 Long term (current) use of inhaled steroids: Secondary | ICD-10-CM | POA: Diagnosis not present

## 2023-07-19 DIAGNOSIS — I5032 Chronic diastolic (congestive) heart failure: Secondary | ICD-10-CM | POA: Diagnosis not present

## 2023-07-19 DIAGNOSIS — Z79899 Other long term (current) drug therapy: Secondary | ICD-10-CM

## 2023-07-19 DIAGNOSIS — J9622 Acute and chronic respiratory failure with hypercapnia: Secondary | ICD-10-CM | POA: Diagnosis not present

## 2023-07-19 DIAGNOSIS — I5033 Acute on chronic diastolic (congestive) heart failure: Secondary | ICD-10-CM | POA: Diagnosis present

## 2023-07-19 DIAGNOSIS — F17200 Nicotine dependence, unspecified, uncomplicated: Secondary | ICD-10-CM | POA: Diagnosis present

## 2023-07-19 DIAGNOSIS — J441 Chronic obstructive pulmonary disease with (acute) exacerbation: Principal | ICD-10-CM | POA: Diagnosis present

## 2023-07-19 DIAGNOSIS — J929 Pleural plaque without asbestos: Secondary | ICD-10-CM | POA: Diagnosis not present

## 2023-07-19 DIAGNOSIS — Z800 Family history of malignant neoplasm of digestive organs: Secondary | ICD-10-CM

## 2023-07-19 DIAGNOSIS — J9621 Acute and chronic respiratory failure with hypoxia: Secondary | ICD-10-CM | POA: Diagnosis not present

## 2023-07-19 DIAGNOSIS — Z8249 Family history of ischemic heart disease and other diseases of the circulatory system: Secondary | ICD-10-CM

## 2023-07-19 DIAGNOSIS — Z5320 Procedure and treatment not carried out because of patient's decision for unspecified reasons: Secondary | ICD-10-CM | POA: Diagnosis not present

## 2023-07-19 DIAGNOSIS — R0689 Other abnormalities of breathing: Secondary | ICD-10-CM | POA: Diagnosis not present

## 2023-07-19 DIAGNOSIS — Z7982 Long term (current) use of aspirin: Secondary | ICD-10-CM

## 2023-07-19 DIAGNOSIS — E878 Other disorders of electrolyte and fluid balance, not elsewhere classified: Secondary | ICD-10-CM | POA: Diagnosis not present

## 2023-07-19 DIAGNOSIS — I5023 Acute on chronic systolic (congestive) heart failure: Secondary | ICD-10-CM | POA: Diagnosis not present

## 2023-07-19 DIAGNOSIS — Z803 Family history of malignant neoplasm of breast: Secondary | ICD-10-CM

## 2023-07-19 DIAGNOSIS — F419 Anxiety disorder, unspecified: Secondary | ICD-10-CM | POA: Diagnosis present

## 2023-07-19 DIAGNOSIS — Z9981 Dependence on supplemental oxygen: Secondary | ICD-10-CM

## 2023-07-19 DIAGNOSIS — R5381 Other malaise: Secondary | ICD-10-CM | POA: Diagnosis present

## 2023-07-19 DIAGNOSIS — Z6832 Body mass index (BMI) 32.0-32.9, adult: Secondary | ICD-10-CM

## 2023-07-19 DIAGNOSIS — J209 Acute bronchitis, unspecified: Secondary | ICD-10-CM | POA: Diagnosis not present

## 2023-07-19 DIAGNOSIS — I10 Essential (primary) hypertension: Secondary | ICD-10-CM | POA: Diagnosis not present

## 2023-07-19 DIAGNOSIS — J440 Chronic obstructive pulmonary disease with acute lower respiratory infection: Secondary | ICD-10-CM | POA: Diagnosis not present

## 2023-07-19 DIAGNOSIS — Z821 Family history of blindness and visual loss: Secondary | ICD-10-CM

## 2023-07-19 DIAGNOSIS — E669 Obesity, unspecified: Secondary | ICD-10-CM | POA: Diagnosis present

## 2023-07-19 DIAGNOSIS — Z1152 Encounter for screening for COVID-19: Secondary | ICD-10-CM

## 2023-07-19 DIAGNOSIS — R531 Weakness: Secondary | ICD-10-CM | POA: Diagnosis not present

## 2023-07-19 DIAGNOSIS — R918 Other nonspecific abnormal finding of lung field: Secondary | ICD-10-CM | POA: Diagnosis not present

## 2023-07-19 DIAGNOSIS — J9601 Acute respiratory failure with hypoxia: Secondary | ICD-10-CM | POA: Diagnosis present

## 2023-07-19 DIAGNOSIS — G4733 Obstructive sleep apnea (adult) (pediatric): Secondary | ICD-10-CM | POA: Diagnosis present

## 2023-07-19 DIAGNOSIS — D72829 Elevated white blood cell count, unspecified: Secondary | ICD-10-CM | POA: Diagnosis present

## 2023-07-19 DIAGNOSIS — Z743 Need for continuous supervision: Secondary | ICD-10-CM | POA: Diagnosis not present

## 2023-07-19 DIAGNOSIS — R6889 Other general symptoms and signs: Secondary | ICD-10-CM | POA: Diagnosis not present

## 2023-07-19 DIAGNOSIS — R062 Wheezing: Secondary | ICD-10-CM | POA: Diagnosis not present

## 2023-07-19 DIAGNOSIS — I509 Heart failure, unspecified: Secondary | ICD-10-CM

## 2023-07-19 DIAGNOSIS — E785 Hyperlipidemia, unspecified: Secondary | ICD-10-CM | POA: Diagnosis not present

## 2023-07-19 DIAGNOSIS — I700 Atherosclerosis of aorta: Secondary | ICD-10-CM | POA: Diagnosis not present

## 2023-07-19 DIAGNOSIS — J439 Emphysema, unspecified: Secondary | ICD-10-CM | POA: Diagnosis present

## 2023-07-19 DIAGNOSIS — Z87440 Personal history of urinary (tract) infections: Secondary | ICD-10-CM | POA: Diagnosis not present

## 2023-07-19 DIAGNOSIS — J9602 Acute respiratory failure with hypercapnia: Secondary | ICD-10-CM | POA: Diagnosis not present

## 2023-07-19 DIAGNOSIS — I517 Cardiomegaly: Secondary | ICD-10-CM | POA: Diagnosis not present

## 2023-07-19 HISTORY — DX: Acute respiratory failure with hypoxia: J96.01

## 2023-07-19 LAB — I-STAT VENOUS BLOOD GAS, ED
Acid-Base Excess: 22 mmol/L — ABNORMAL HIGH (ref 0.0–2.0)
Bicarbonate: 51.9 mmol/L — ABNORMAL HIGH (ref 20.0–28.0)
Calcium, Ion: 1 mmol/L — ABNORMAL LOW (ref 1.15–1.40)
HCT: 38 % (ref 36.0–46.0)
Hemoglobin: 12.9 g/dL (ref 12.0–15.0)
O2 Saturation: 97 %
Potassium: 5.9 mmol/L — ABNORMAL HIGH (ref 3.5–5.1)
Sodium: 129 mmol/L — ABNORMAL LOW (ref 135–145)
TCO2: >50 mmol/L — ABNORMAL HIGH (ref 22–32)
pCO2, Ven: 83.8 mmHg (ref 44–60)
pH, Ven: 7.4 (ref 7.25–7.43)
pO2, Ven: 95 mmHg — ABNORMAL HIGH (ref 32–45)

## 2023-07-19 LAB — CBC WITH DIFFERENTIAL/PLATELET
Abs Immature Granulocytes: 0.26 10*3/uL — ABNORMAL HIGH (ref 0.00–0.07)
Basophils Absolute: 0.1 10*3/uL (ref 0.0–0.1)
Basophils Relative: 1 %
Eosinophils Absolute: 0.1 10*3/uL (ref 0.0–0.5)
Eosinophils Relative: 1 %
HCT: 38.4 % (ref 36.0–46.0)
Hemoglobin: 11 g/dL — ABNORMAL LOW (ref 12.0–15.0)
Immature Granulocytes: 2 %
Lymphocytes Relative: 10 %
Lymphs Abs: 1.3 10*3/uL (ref 0.7–4.0)
MCH: 29.3 pg (ref 26.0–34.0)
MCHC: 28.6 g/dL — ABNORMAL LOW (ref 30.0–36.0)
MCV: 102.4 fL — ABNORMAL HIGH (ref 80.0–100.0)
Monocytes Absolute: 1 10*3/uL (ref 0.1–1.0)
Monocytes Relative: 8 %
Neutro Abs: 10.3 10*3/uL — ABNORMAL HIGH (ref 1.7–7.7)
Neutrophils Relative %: 78 %
Platelets: 193 10*3/uL (ref 150–400)
RBC: 3.75 MIL/uL — ABNORMAL LOW (ref 3.87–5.11)
RDW: 12.5 % (ref 11.5–15.5)
WBC: 13 10*3/uL — ABNORMAL HIGH (ref 4.0–10.5)
nRBC: 0.2 % (ref 0.0–0.2)

## 2023-07-19 LAB — URINALYSIS, ROUTINE W REFLEX MICROSCOPIC
Bilirubin Urine: NEGATIVE
Glucose, UA: NEGATIVE mg/dL
Hgb urine dipstick: NEGATIVE
Ketones, ur: NEGATIVE mg/dL
Leukocytes,Ua: NEGATIVE
Nitrite: NEGATIVE
Protein, ur: NEGATIVE mg/dL
Specific Gravity, Urine: 1.009 (ref 1.005–1.030)
pH: 7 (ref 5.0–8.0)

## 2023-07-19 LAB — I-STAT ARTERIAL BLOOD GAS, ED
Acid-Base Excess: 19 mmol/L — ABNORMAL HIGH (ref 0.0–2.0)
Bicarbonate: 49.2 mmol/L — ABNORMAL HIGH (ref 20.0–28.0)
Calcium, Ion: 1.07 mmol/L — ABNORMAL LOW (ref 1.15–1.40)
HCT: 36 % (ref 36.0–46.0)
Hemoglobin: 12.2 g/dL (ref 12.0–15.0)
O2 Saturation: 92 %
Patient temperature: 97.6
Potassium: 3.4 mmol/L — ABNORMAL LOW (ref 3.5–5.1)
Sodium: 131 mmol/L — ABNORMAL LOW (ref 135–145)
TCO2: >50 mmol/L — ABNORMAL HIGH (ref 22–32)
pCO2 arterial: 85.9 mmHg (ref 32–48)
pH, Arterial: 7.364 (ref 7.35–7.45)
pO2, Arterial: 68 mmHg — ABNORMAL LOW (ref 83–108)

## 2023-07-19 LAB — RESPIRATORY PANEL BY PCR

## 2023-07-19 LAB — ECHOCARDIOGRAM COMPLETE
AR max vel: 2.05 cm2
AV Area VTI: 2.23 cm2
AV Area mean vel: 2.03 cm2
AV Mean grad: 13 mmHg
AV Peak grad: 24.4 mmHg
Ao pk vel: 2.47 m/s
Area-P 1/2: 4.65 cm2
Height: 63 in
S' Lateral: 3 cm
Weight: 3020.8 oz

## 2023-07-19 LAB — COMPREHENSIVE METABOLIC PANEL
ALT: 21 U/L (ref 0–44)
AST: 18 U/L (ref 15–41)
Albumin: 3.6 g/dL (ref 3.5–5.0)
Alkaline Phosphatase: 68 U/L (ref 38–126)
Anion gap: 15 (ref 5–15)
BUN: 10 mg/dL (ref 8–23)
CO2: 39 mmol/L — ABNORMAL HIGH (ref 22–32)
Calcium: 8.7 mg/dL — ABNORMAL LOW (ref 8.9–10.3)
Chloride: 81 mmol/L — ABNORMAL LOW (ref 98–111)
Creatinine, Ser: 0.57 mg/dL (ref 0.44–1.00)
GFR, Estimated: >60 mL/min (ref >60)
Glucose, Bld: 152 mg/dL — ABNORMAL HIGH (ref 70–99)
Potassium: 4.1 mmol/L (ref 3.5–5.1)
Sodium: 135 mmol/L (ref 135–145)
Total Bilirubin: 0.6 mg/dL (ref 0.3–1.2)
Total Protein: 6.7 g/dL (ref 6.5–8.1)

## 2023-07-19 LAB — RESP PANEL BY RT-PCR (RSV, FLU A&B, COVID)  RVPGX2
Influenza A by PCR: NEGATIVE
Influenza B by PCR: NEGATIVE
Resp Syncytial Virus by PCR: NEGATIVE
SARS Coronavirus 2 by RT PCR: NEGATIVE

## 2023-07-19 LAB — BRAIN NATRIURETIC PEPTIDE: B Natriuretic Peptide: 425.4 pg/mL — ABNORMAL HIGH (ref 0.0–100.0)

## 2023-07-19 LAB — TROPONIN I (HIGH SENSITIVITY)
Troponin I (High Sensitivity): 36 ng/L — ABNORMAL HIGH (ref <18)
Troponin I (High Sensitivity): 32 ng/L — ABNORMAL HIGH (ref <18)

## 2023-07-19 LAB — PROCALCITONIN: Procalcitonin: <0.1 ng/mL

## 2023-07-19 MED ORDER — ONDANSETRON HCL 4 MG PO TABS
4.0000 mg | ORAL_TABLET | Freq: Four times a day (QID) | ORAL | Status: DC | PRN
  Administered 2023-07-19: 4 mg via ORAL
  Filled 2023-07-19: qty 1

## 2023-07-19 MED ORDER — IPRATROPIUM-ALBUTEROL 0.5-2.5 (3) MG/3ML IN SOLN
3.0000 mL | Freq: Four times a day (QID) | RESPIRATORY_TRACT | Status: DC
  Administered 2023-07-19 – 2023-07-21 (×7): 3 mL via RESPIRATORY_TRACT
  Filled 2023-07-19 (×7): qty 3

## 2023-07-19 MED ORDER — PREDNISONE 20 MG PO TABS: 40.0000 mg | ORAL_TABLET | Freq: Every day | ORAL | Status: DC

## 2023-07-19 MED ORDER — IPRATROPIUM BROMIDE 0.02 % IN SOLN
0.5000 mg | Freq: Once | RESPIRATORY_TRACT | Status: AC
  Administered 2023-07-19: 0.5 mg via RESPIRATORY_TRACT
  Filled 2023-07-19: qty 2.5

## 2023-07-19 MED ORDER — FUROSEMIDE 10 MG/ML IJ SOLN
40.0000 mg | Freq: Once | INTRAMUSCULAR | Status: AC
  Administered 2023-07-19: 40 mg via INTRAVENOUS
  Filled 2023-07-19: qty 4

## 2023-07-19 MED ORDER — GUAIFENESIN ER 600 MG PO TB12
600.0000 mg | ORAL_TABLET | Freq: Two times a day (BID) | ORAL | Status: DC
  Administered 2023-07-20 – 2023-07-24 (×9): 600 mg via ORAL
  Filled 2023-07-19 (×10): qty 1

## 2023-07-19 MED ORDER — PANTOPRAZOLE SODIUM 20 MG PO TBEC
20.0000 mg | DELAYED_RELEASE_TABLET | Freq: Every day | ORAL | Status: DC
  Administered 2023-07-19 – 2023-07-24 (×6): 20 mg via ORAL
  Filled 2023-07-19 (×6): qty 1

## 2023-07-19 MED ORDER — ENOXAPARIN SODIUM 40 MG/0.4ML IJ SOSY
40.0000 mg | PREFILLED_SYRINGE | INTRAMUSCULAR | Status: DC
  Administered 2023-07-19 – 2023-07-24 (×6): 40 mg via SUBCUTANEOUS
  Filled 2023-07-19 (×5): qty 0.4

## 2023-07-19 MED ORDER — ALBUTEROL SULFATE (2.5 MG/3ML) 0.083% IN NEBU
2.5000 mg | INHALATION_SOLUTION | RESPIRATORY_TRACT | Status: DC | PRN
  Administered 2023-07-24: 2.5 mg via RESPIRATORY_TRACT
  Filled 2023-07-19: qty 3

## 2023-07-19 MED ORDER — STERILE WATER FOR INJECTION IJ SOLN
INTRAMUSCULAR | Status: AC
  Administered 2023-07-19: 2.5 mL
  Filled 2023-07-19: qty 10

## 2023-07-19 MED ORDER — METHYLPREDNISOLONE SODIUM SUCC 40 MG IJ SOLR
40.0000 mg | Freq: Two times a day (BID) | INTRAMUSCULAR | Status: AC
  Administered 2023-07-19 – 2023-07-20 (×3): 40 mg via INTRAVENOUS
  Filled 2023-07-19 (×3): qty 1

## 2023-07-19 MED ORDER — MAGNESIUM SULFATE 2 GM/50ML IV SOLN
2.0000 g | Freq: Once | INTRAVENOUS | Status: AC
  Administered 2023-07-19: 2 g via INTRAVENOUS
  Filled 2023-07-19: qty 50

## 2023-07-19 MED ORDER — BUDESONIDE 0.5 MG/2ML IN SUSP
0.5000 mg | Freq: Two times a day (BID) | RESPIRATORY_TRACT | Status: DC
  Administered 2023-07-19 – 2023-07-24 (×11): 0.5 mg via RESPIRATORY_TRACT
  Filled 2023-07-19 (×13): qty 2

## 2023-07-19 MED ORDER — ACETAMINOPHEN 325 MG PO TABS
650.0000 mg | ORAL_TABLET | Freq: Four times a day (QID) | ORAL | Status: DC | PRN
  Administered 2023-07-19 – 2023-07-23 (×3): 650 mg via ORAL
  Filled 2023-07-19 (×4): qty 2

## 2023-07-19 MED ORDER — ALBUTEROL SULFATE (2.5 MG/3ML) 0.083% IN NEBU
10.0000 mg/h | INHALATION_SOLUTION | Freq: Once | RESPIRATORY_TRACT | Status: AC
  Administered 2023-07-19: 10 mg/h via RESPIRATORY_TRACT
  Filled 2023-07-19: qty 12

## 2023-07-19 MED ORDER — ESCITALOPRAM OXALATE 10 MG PO TABS
5.0000 mg | ORAL_TABLET | Freq: Every day | ORAL | Status: DC
  Administered 2023-07-19 – 2023-07-24 (×6): 5 mg via ORAL
  Filled 2023-07-19 (×6): qty 1

## 2023-07-19 MED ORDER — LOSARTAN POTASSIUM 50 MG PO TABS
50.0000 mg | ORAL_TABLET | Freq: Every day | ORAL | Status: DC
  Administered 2023-07-19 – 2023-07-24 (×6): 50 mg via ORAL
  Filled 2023-07-19 (×6): qty 1

## 2023-07-19 MED ORDER — ONDANSETRON HCL 4 MG/2ML IJ SOLN: 4.0000 mg | Freq: Four times a day (QID) | INTRAMUSCULAR | Status: DC | PRN

## 2023-07-19 MED ORDER — SODIUM CHLORIDE 0.9 % IV SOLN
2.0000 g | INTRAVENOUS | Status: DC
  Administered 2023-07-19 – 2023-07-23 (×5): 2 g via INTRAVENOUS
  Filled 2023-07-19 (×6): qty 20

## 2023-07-19 MED ORDER — FENTANYL CITRATE PF 50 MCG/ML IJ SOSY
50.0000 ug | PREFILLED_SYRINGE | Freq: Once | INTRAMUSCULAR | Status: AC
  Administered 2023-07-19: 50 ug via INTRAVENOUS
  Filled 2023-07-19: qty 1

## 2023-07-19 MED ORDER — ATORVASTATIN CALCIUM 80 MG PO TABS
80.0000 mg | ORAL_TABLET | Freq: Every day | ORAL | Status: DC
  Administered 2023-07-19 – 2023-07-24 (×6): 80 mg via ORAL
  Filled 2023-07-19: qty 2
  Filled 2023-07-19 (×4): qty 1
  Filled 2023-07-19: qty 2

## 2023-07-19 MED ORDER — ACETAMINOPHEN 650 MG RE SUPP: 650.0000 mg | Freq: Four times a day (QID) | RECTAL | Status: DC | PRN

## 2023-07-19 MED ORDER — ACETAZOLAMIDE SODIUM 500 MG IJ SOLR
250.0000 mg | Freq: Every day | INTRAMUSCULAR | Status: AC
  Administered 2023-07-19: 250 mg via INTRAVENOUS
  Filled 2023-07-19 (×3): qty 250

## 2023-07-19 MED ORDER — SODIUM CHLORIDE 0.9% FLUSH
3.0000 mL | Freq: Two times a day (BID) | INTRAVENOUS | Status: DC
  Administered 2023-07-19 – 2023-07-24 (×11): 3 mL via INTRAVENOUS

## 2023-07-19 MED ORDER — PREDNISONE 20 MG PO TABS
40.0000 mg | ORAL_TABLET | Freq: Every day | ORAL | Status: DC
  Administered 2023-07-21: 40 mg via ORAL
  Filled 2023-07-19: qty 2

## 2023-07-19 MED ORDER — ALPRAZOLAM 0.25 MG PO TABS
0.2500 mg | ORAL_TABLET | Freq: Two times a day (BID) | ORAL | Status: DC | PRN
  Administered 2023-07-19 – 2023-07-24 (×11): 0.25 mg via ORAL
  Filled 2023-07-19 (×11): qty 1

## 2023-07-19 MED ORDER — HYDROCODONE-ACETAMINOPHEN 10-325 MG PO TABS
1.0000 | ORAL_TABLET | Freq: Four times a day (QID) | ORAL | Status: DC | PRN
  Administered 2023-07-19 – 2023-07-24 (×18): 1 via ORAL
  Filled 2023-07-19 (×18): qty 1

## 2023-07-19 MED ORDER — AZITHROMYCIN 250 MG PO TABS
500.0000 mg | ORAL_TABLET | Freq: Every day | ORAL | Status: DC
  Administered 2023-07-19 – 2023-07-23 (×5): 500 mg via ORAL
  Filled 2023-07-19 (×5): qty 2

## 2023-07-19 MED ORDER — DILTIAZEM HCL ER COATED BEADS 180 MG PO CP24
180.0000 mg | ORAL_CAPSULE | Freq: Every day | ORAL | Status: DC
  Administered 2023-07-19 – 2023-07-24 (×6): 180 mg via ORAL
  Filled 2023-07-19 (×7): qty 1

## 2023-07-19 MED ORDER — HYDRALAZINE HCL 20 MG/ML IJ SOLN: 10.0000 mg | INTRAMUSCULAR | Status: DC | PRN

## 2023-07-19 NOTE — ED Notes (Signed)
Pt states when she is out and about she has her O2 at 4L. When she is at home she sets it at 3L since she is sitting down.

## 2023-07-19 NOTE — ED Notes (Signed)
Pt states she is hungry and thirsty. RN notified MD for approval

## 2023-07-19 NOTE — Consult Note (Signed)
NAME:  [name], MRN:  [id number], DOB:  02/11/1951, LOS: 0 ADMISSION DATE:  07/19/2023, CONSULTATION DATE:  07/19/23 REFERRING MD:  Katrinka Blazing [facility]), CHIEF COMPLAINT:  acute on chronic respiratory failure    History of Present Illness:  72yo female smoker with hx chronic respiratory failure on 3-4L home O2 in setting COPD followed by Dr. Sherene Sires as outpt, CHF, HLD, HTN with recent admission 7/17-7/19 after a fall at home with head trauma and metabolic encephalopathy r/t [facility] UTI. SHe was d/c home on nitrofurantoin. She returned to ER 7/24 with worsening SOB, weakness, subjective fever, wheezing. On EMS arrival she was noted to have O2 sats 67%.  Now in ER, back to baseline 3L [place].  She has improved with NRB, duoneb, Lasix 40mg  x1 and solumedrol. She denies chest pain, hemoptysis, purulent sputum, known sick contacts, abd pain, n/v/d.    Pertinent  Medical History   has a past medical history of Anxiety, Atrophic vaginitis (10/09/2015), Baker's cyst, Blood in urine (10/09/2015), CHF (congestive heart failure) (HCC), COPD (chronic obstructive pulmonary disease) (HCC), Heart murmur, Hematuria (10/09/2015), Hypertension, Nicotine addiction (04/07/2014), Oxygen dependent, Pneumonia, Urinary retention (07/01/2020), Urinary urgency (01/25/2016), and Vaginal bleeding (10/09/2015).   Significant Hospital Events: Including procedures, antibiotic start and stop dates in addition to other pertinent events     Interim History / Subjective:  Hungry, does not want the bag lunch.  Afebrile, WBC 13, ABG with pH 7.36, PCO2 86 Back to baseline 3L [place]. Still feels a bit more SOB than normal but much improved from this morning.   Objective   Blood pressure (!) 147/107, pulse 93, temperature 98.1 F (36.7 C), temperature source Oral, resp. rate (!) 21, height 5\' 3"  (1.6 m), weight 85.6 kg, SpO2 94%.        Intake/Output Summary (Last 24 hours) at 07/19/2023 1153 Last data filed at 07/19/2023 0555 Gross per 24  hour  Intake 50 ml  Output --  Net 50 ml   Filed Weights   07/19/23 0344  Weight: 85.6 kg    Examination: General: chronically ill appearing female, NAD seen in ER HENT: mm moist, no JVD, residual ecchymosis R eye/R face from recent fall  Lungs: resps even non labored on [place], diminished throughout, few scattered exp wheezes Cardiovascular: s1s2 rrr Abdomen: soft, non tender, +bs  Extremities: warm and dry, no edema  Neuro: awake, alert, appropriate, MAE    Resolved Hospital Problem list     Assessment & Plan:  Acute on chronic hypercarbic and hypoxic respiratory failure - multifactorial in setting AECOPD, acute on chronic dCHF, ?HCAP with some focal consolidation on recent CT scan +/- acute bronchitis  COPD  Acute on chronic diastolic CHF PLAN -  Supplemental O2 as needed to keep sats 88-92% Rocephin/ azithro  Flu/covid/rsv pending  RVP  Monitor volume status closely - hold on further diuresis for now  Trend BNP  IV steroids  Duoneb, budesonide, PRN albuterol  Sputum culture if able  Pulmonary hygiene   Best Practice (right click and "Reselect all SmartList Selections" daily)   Diet/type: Regular consistency (see orders) DVT prophylaxis: LMWH GI prophylaxis: N/A Lines: N/A Foley:  N/A Code Status:  full code Last date of multidisciplinary goals of care discussion [tbd]  Labs   CBC: Recent Labs  Lab 07/13/23 0724 07/14/23 0043 07/19/23 0358 07/19/23 0405 07/19/23 0803  WBC 12.9* 9.0 13.0*  --   --   NEUTROABS  --  6.2 10.3*  --   --  HGB 10.5* 10.6* 11.0* 12.9 12.2  HCT 36.4 38.0 38.4 38.0 36.0  MCV 101.4* 103.5* 102.4*  --   --   PLT 211 202 193  --   --     Basic Metabolic Panel: Recent Labs  Lab 07/13/23 0724 07/14/23 0043 07/19/23 0358 07/19/23 0405 07/19/23 0803  NA 136 138 135 129* 131*  K 4.9 4.2 4.1 5.9* 3.4*  CL 88* 90* 81*  --   --   CO2 43* 37* 39*  --   --   GLUCOSE 102* 97 152*  --   --   BUN 18 16 10   --   --   CREATININE  0.62 0.60 0.57  --   --   CALCIUM 9.0 8.6* 8.7*  --   --   MG  --  2.3  --   --   --   PHOS  --  4.7*  --   --   --    GFR: Estimated Creatinine Clearance: 68.8 mL/min (by C-G formula based on SCr of 0.57 mg/dL). Recent Labs  Lab 07/13/23 0724 07/14/23 0043 07/19/23 0358 07/19/23 0607  PROCALCITON  --   --   --  <0.10  WBC 12.9* 9.0 13.0*  --     Liver Function Tests: Recent Labs  Lab 07/13/23 0724 07/19/23 0358  AST 24 18  ALT 21 21  ALKPHOS 65 68  BILITOT 0.6 0.6  PROT 6.9 6.7  ALBUMIN 3.6 3.6   No results for input(s): "LIPASE", "AMYLASE" in the last 168 hours. No results for input(s): "AMMONIA" in the last 168 hours.  ABG    Component Value Date/Time   PHART 7.364 07/19/2023 0803   PCO2ART 85.9 (HH) 07/19/2023 0803   PO2ART 68 (L) 07/19/2023 0803   HCO3 49.2 (H) 07/19/2023 0803   TCO2 >50 (H) 07/19/2023 0803   O2SAT 92 07/19/2023 0803     Coagulation Profile: No results for input(s): "INR", "PROTIME" in the last 168 hours.  Cardiac Enzymes: No results for input(s): "CKTOTAL", "CKMB", "CKMBINDEX", "TROPONINI" in the last 168 hours.  HbA1C: Hgb A1c MFr Bld  Date/Time Value Ref Range Status  07/01/2022 02:07 AM 5.4 4.8 - 5.6 % Final    Comment:    (NOTE) Pre diabetes:          5.7%-6.4%  Diabetes:              >6.4%  Glycemic control for   <7.0% adults with diabetes     CBG: No results for input(s): "GLUCAP" in the last 168 hours.  Review of Systems:   As per HPI - All other systems reviewed and were neg.    Past Medical History:  She,  has a past medical history of Anxiety, Atrophic vaginitis (10/09/2015), Baker's cyst, Blood in urine (10/09/2015), CHF (congestive heart failure) (HCC), COPD (chronic obstructive pulmonary disease) (HCC), Heart murmur, Hematuria (10/09/2015), Hypertension, Nicotine addiction (04/07/2014), Oxygen dependent, Pneumonia, Urinary retention (07/01/2020), Urinary urgency (01/25/2016), and Vaginal bleeding (10/09/2015).    Surgical History:   Past Surgical History:  Procedure Laterality Date   ABDOMINAL HYSTERECTOMY     CATARACT EXTRACTION Left 03/2015   CESAREAN SECTION     COLONOSCOPY  10/19/2004   Dr. Jena Gauss- internal hemorrhoids, o/w normal rectum, normal colon   COLONOSCOPY N/A 07/30/2014   [id number] diverticulosis. Colonic polyp-removed TA. repeat TCS 07/2021   COLONOSCOPY WITH PROPOFOL N/A 03/29/2021   diverticulosis in sigmoid and descending colon, one 5 mm polyp ascending colon (adenoma), non-bleeding  internal hemorrhoids. 5 year surveillance   ESOPHAGOGASTRODUODENOSCOPY N/A 07/30/2014   [id number] EGD   POLYPECTOMY  03/29/2021   Procedure: POLYPECTOMY;  Surgeon: Corbin Ade, MD;  Location: [facility];  Service: Endoscopy;;   RADIAL HEAD ARTHROPLASTY Left 06/23/2017   Procedure: RADIAL HEAD ARTHROPLASTY WITH LIGAMENT REPAIR;  Surgeon: Cammy Copa, MD;  Location[facility] OR;  Service: Orthopedics;  Laterality: Left;     Social History:   reports that she quit smoking about 10 years ago. Her smoking use included cigarettes. She started smoking about 50 years ago. She has been exposed to tobacco smoke. She has never used smokeless tobacco. She reports current alcohol use. She reports that she does not use drugs.   Family History:  Her family history includes Blindness in her maternal grandfather; Cancer in her daughter, sister, sister, sister, and sister; Hypertension in her sister; Migraines in her daughter; Other in her brother, brother, sister, and sister. There is no history of Colon cancer. She was adopted.   Allergies No Known Allergies   Home Medications  Prior to Admission medications   Medication Sig Start Date End Date Taking? Authorizing Provider  albuterol (PROVENTIL) (2.5 MG/3ML) 0.083% nebulizer solution Take 2.5 mg by nebulization every 4 (four) hours as needed for wheezing or shortness of breath.    [provider]  albuterol (VENTOLIN HFA) 108 (90 Base) MCG/ACT  inhaler Inhale 2 puffs into the lungs every 6 (six) hours as needed for wheezing or shortness of breath. For shortness of breath 12/27/22   Nyoka Cowden, MD  ALPRAZolam Prudy Feeler) 0.25 MG tablet Take 0.25 mg by mouth 2 (two) times daily as needed for anxiety.    [provider]  aspirin EC 81 MG tablet Take 81 mg by mouth in the morning. Swallow whole.    [provider]  atorvastatin (LIPITOR) 80 MG tablet Take 1 tablet (80 mg total) by mouth daily. 07/07/22   Almon Hercules, MD  bethanechol (URECHOLINE) 10 MG tablet Take 1 tablet (10 mg total) by mouth in the morning and at bedtime. 06/08/23   McKenzie, Mardene Celeste, MD  Cyanocobalamin (VITAMIN B-12 CR) 1000 MCG TBCR Take 1 tablet by mouth daily. 07/07/22   [provider]  diltiazem (CARDIZEM CD) 180 MG 24 hr capsule Take 1 capsule (180 mg total) by mouth daily. 07/08/22   Almon Hercules, MD  escitalopram (LEXAPRO) 5 MG tablet Take 5 mg by mouth daily.    [provider]  furosemide (LASIX) 20 MG tablet Take 1 tablet (20 mg total) by mouth daily. Patient taking differently: Take 20 mg by mouth daily as needed for fluid or edema. 07/08/22   Almon Hercules, MD  HYDROcodone-acetaminophen (NORCO) 10-325 MG tablet Take 1 tablet by mouth 4 (four) times daily as needed for moderate pain. 07/14/22   [provider]  losartan (COZAAR) 50 MG tablet Take 50 mg by mouth daily. 07/22/22   [provider]  melatonin 5 MG TABS Take 1 tablet (5 mg total) by mouth once as needed. 07/07/22   Almon Hercules, MD  mirtazapine (REMERON) 7.5 MG tablet Take 7.5 mg by mouth at bedtime as needed (Sleep). 02/09/23   [provider]  Multiple Vitamin (MULTIVITAMIN WITH MINERALS) TABS tablet Take 1 tablet by mouth in the morning.    [provider]  nitrofurantoin, macrocrystal-monohydrate, (MACROBID) 100 MG capsule Take 1 capsule (100 mg) by mouth 2 times daily for 5 days. 07/14/23 07/19/23  Pahwani,  Daleen Bo, MD  pantoprazole  (PROTONIX) 20 MG tablet Take 1 tablet (20 mg total) by mouth daily. 07/08/22   Almon Hercules, MD  tamsulosin (FLOMAX) 0.4 MG CAPS capsule Take 1 capsule (0.4 mg total) by mouth daily after supper. 01/17/23   McKenzie, Mardene Celeste, MD  TRELEGY ELLIPTA 100-62.5-25 MCG/INH AEPB Take 1 puff by mouth in the morning. 02/19/21   [provider]    Dirk Dress, NP Pulmonary/Critical Care Medicine  07/19/2023  11:53 AM

## 2023-07-19 NOTE — ED Provider Notes (Signed)
[place] EMERGENCY DEPARTMENT AT [facility] Provider Note  CSN: [id number] Arrival date & time: 07/19/23 0335  Chief Complaint(s) Shortness of Breath  HPI [name] is a 72 y.o. female with PMH CHF, COPD on home 4 L nasal cannula, HTN, recent hospital admission for fall with acute metabolic encephalopathy with discharge on 07/14/2023 who presents emergency room for evaluation of shortness of breath.  Shortness of breath worsened rapidly this evening and patient found hypoxic in the 60s by EMS tripoding and short of breath.  Severely diminished lung sounds in the field and patient received 2 DuoNebs and methylprednisolone prior to arrival.  Blood pressures greater than 200 in the field.  Currently patient endorses shortness of breath but denies chest pain, abdominal pain, nausea, vomiting or other systemic symptoms.   Past Medical History Past Medical History:  Diagnosis Date   Anxiety    Atrophic vaginitis 10/09/2015   Baker's cyst    left leg   Blood in urine 10/09/2015   CHF (congestive heart failure) (HCC)    COPD (chronic obstructive pulmonary disease) (HCC)    Heart murmur    Hematuria 10/09/2015   Hypertension    Nicotine addiction 04/07/2014   Oxygen dependent    4L/min [place] all the time   Pneumonia    Urinary retention 07/01/2020   Urinary urgency 01/25/2016   Vaginal bleeding 10/09/2015   Patient Active Problem List   Diagnosis Date Noted   Chronic diastolic CHF (congestive heart failure) (HCC) 07/19/2023   Acute respiratory failure with hypoxia and hypercapnia (HCC) 07/19/2023   Leukocytosis 07/19/2023   History of UTI 07/19/2023   COPD GOLD 4  07/27/2022   Chronic lower back pain 07/06/2022   Pulmonary nodule 07/06/2022   Obesity (BMI 30-39.9) 07/06/2022   Normocytic anemia 07/06/2022   Severe sepsis (HCC) 06/30/2022   Acute pyelonephritis 06/30/2022   E coli bacteremia 06/30/2022   Acute metabolic encephalopathy 06/30/2022   Acute on  chronic respiratory failure with hypoxia and hypercapnia (HCC) 06/30/2022   Urinary tract infection 06/30/2022   Elevated troponin    Hypercholesterolemia 03/24/2022   COPD with acute exacerbation (HCC) 06/15/2021   Essential hypertension    Anxiety    Acute on chronic diastolic CHF (congestive heart failure) (HCC)    Therapeutic opioid-induced constipation (OIC) 05/27/2021   Urinary retention 07/01/2020   Hematochezia 11/09/2018   Presence of left artificial elbow joint 06/29/2017   Closed displaced fracture of head of radius with routine healing 06/29/2017   Urinary urgency 01/25/2016   Blood in urine 10/09/2015   Vaginal bleeding 10/09/2015   Atrophic vaginitis 10/09/2015   Hematuria 10/09/2015   GERD (gastroesophageal reflux disease) 09/04/2014   Macrocytic anemia 09/04/2014   Constipation 07/14/2014   Nicotine addiction 04/07/2014   Home Medication(s) Prior to Admission medications   Medication Sig Start Date End Date Taking? Authorizing Provider  albuterol (PROVENTIL) (2.5 MG/3ML) 0.083% nebulizer solution Take 2.5 mg by nebulization every 4 (four) hours as needed for wheezing or shortness of breath.   Yes [provider]  albuterol (VENTOLIN HFA) 108 (90 Base) MCG/ACT inhaler Inhale 2 puffs into the lungs every 6 (six) hours as needed for wheezing or shortness of breath. For shortness of breath 12/27/22  Yes Nyoka Cowden, MD  ALPRAZolam Prudy Feeler) 0.25 MG tablet Take 0.25 mg by mouth 2 (two) times daily as needed for anxiety.   Yes [provider]  aspirin EC 81 MG tablet Take 81 mg by mouth in  the morning. Swallow whole.   Yes [provider]  atorvastatin (LIPITOR) 80 MG tablet Take 1 tablet (80 mg total) by mouth daily. 07/07/22  Yes Almon Hercules, MD  bethanechol (URECHOLINE) 10 MG tablet Take 1 tablet (10 mg total) by mouth in the morning and at bedtime. 06/08/23  Yes McKenzie, Mardene Celeste, MD  diltiazem (CARDIZEM CD) 180 MG 24 hr capsule Take 1  capsule (180 mg total) by mouth daily. 07/08/22  Yes Almon Hercules, MD  escitalopram (LEXAPRO) 5 MG tablet Take 5 mg by mouth daily.   Yes [provider]  furosemide (LASIX) 20 MG tablet Take 1 tablet (20 mg total) by mouth daily. Patient taking differently: Take 20 mg by mouth daily as needed for fluid or edema. 07/08/22  Yes Almon Hercules, MD  HYDROcodone-acetaminophen (NORCO) 10-325 MG tablet Take 1 tablet by mouth 4 (four) times daily as needed for moderate pain. 07/14/22  Yes [provider]  losartan (COZAAR) 50 MG tablet Take 50 mg by mouth daily. 07/22/22  Yes [provider]  mirtazapine (REMERON) 7.5 MG tablet Take 7.5 mg by mouth at bedtime as needed (Sleep). 02/09/23  Yes [provider]  Multiple Vitamin (MULTIVITAMIN WITH MINERALS) TABS tablet Take 1 tablet by mouth in the morning.   Yes [provider]  pantoprazole (PROTONIX) 20 MG tablet Take 1 tablet (20 mg total) by mouth daily. 07/08/22  Yes Almon Hercules, MD  tamsulosin (FLOMAX) 0.4 MG CAPS capsule Take 1 capsule (0.4 mg total) by mouth daily after supper. 01/17/23  Yes McKenzie, Mardene Celeste, MD  TRELEGY ELLIPTA 100-62.5-25 MCG/INH AEPB Take 1 puff by mouth in the morning. 02/19/21  Yes [provider]  melatonin 5 MG TABS Take 1 tablet (5 mg total) by mouth once as needed. Patient not taking: Reported on 07/19/2023 07/07/22   Almon Hercules, MD  nitrofurantoin, macrocrystal-monohydrate, (MACROBID) 100 MG capsule Take 1 capsule (100 mg) by mouth 2 times daily for 5 days. Patient not taking: Reported on 07/19/2023 07/14/23 07/19/23  Hughie Closs, MD                                                                                                                                    Past Surgical History Past Surgical History:  Procedure Laterality Date   ABDOMINAL HYSTERECTOMY     CATARACT EXTRACTION Left 03/2015   CESAREAN SECTION     COLONOSCOPY  10/19/2004   Dr. Jena Gauss- internal  hemorrhoids, o/w normal rectum, normal colon   COLONOSCOPY N/A 07/30/2014   RUE:[id number] diverticulosis. Colonic polyp-removed TA. repeat TCS 07/2021   COLONOSCOPY WITH PROPOFOL N/A 03/29/2021   diverticulosis in sigmoid and descending colon, one 5 mm polyp ascending colon (adenoma), non-bleeding internal hemorrhoids. 5 year surveillance   ESOPHAGOGASTRODUODENOSCOPY N/A 07/30/2014   [id number] EGD   POLYPECTOMY  03/29/2021   Procedure: POLYPECTOMY;  Surgeon: Corbin Ade, MD;  Location: [facility] SUITE;  Service: Endoscopy;;   RADIAL HEAD ARTHROPLASTY Left 06/23/2017   Procedure: RADIAL HEAD ARTHROPLASTY WITH LIGAMENT REPAIR;  Surgeon: Cammy Copa, MD;  Location: [facility] OR;  Service: Orthopedics;  Laterality: Left;   Family History Family History  Adopted: Yes  Problem Relation Age of Onset   Cancer Sister        pancreatic   Migraines Daughter    Cancer Daughter        pre cancerous cells on cervix   Cancer Sister        breast cancer, colon   Blindness Maternal Grandfather    Other Brother        murdered   Other Sister        ruptured colon   Cancer Sister        breast, skin   Other Brother        was in MVA   Cancer Sister        pancreatic   Hypertension Sister    Other Sister        ruptured colon   Colon cancer Neg Hx     Social History Social History   Tobacco Use   Smoking status: Former    Current packs/day: 0.00    Types: Cigarettes    Start date: 04/14/1973    Quit date: 04/14/2013    Years since quitting: 10.2    Passive exposure: Past   Smokeless tobacco: Never  Vaping Use   Vaping status: Never Used  Substance Use Topics   Alcohol use: Yes    Comment: glass of wine maybe once a year   Drug use: No   Allergies Patient has no known allergies.  Review of Systems Review of Systems  Respiratory:  Positive for chest tightness, shortness of breath and wheezing.     Physical Exam Vital Signs  I have reviewed the triage vital signs BP (!) 133/58    Pulse 91   Temp 98.5 F (36.9 C) (Oral)   Resp 13   Ht 5\' 3"  (1.6 m)   Wt 85.6 kg   SpO2 100%   BMI 33.44 kg/m   Physical Exam Vitals and nursing note reviewed.  Constitutional:      Appearance: She is well-developed. She is ill-appearing.  HENT:     Head: Normocephalic and atraumatic.  Eyes:     Conjunctiva/sclera: Conjunctivae normal.  Cardiovascular:     Rate and Rhythm: Normal rate and regular rhythm.     Heart sounds: No murmur heard. Pulmonary:     Effort: Tachypnea, accessory muscle usage and respiratory distress present.     Breath sounds: Wheezing present.  Abdominal:     Palpations: Abdomen is soft.     Tenderness: There is no abdominal tenderness.  Musculoskeletal:        General: No swelling.     Cervical back: Neck supple.  Skin:    General: Skin is warm and dry.     Capillary Refill: Capillary refill takes less than 2 seconds.  Neurological:     Mental Status: She is alert.  Psychiatric:        Mood and Affect: Mood normal.     ED Results and Treatments Labs (all labs ordered are listed, but only abnormal results are displayed) Labs Reviewed  COMPREHENSIVE METABOLIC PANEL - Abnormal; Notable for the following components:      Result Value   Chloride 81 (*)    CO2 39 (*)  Glucose, Bld 152 (*)    Calcium 8.7 (*)    All other components within normal limits  CBC WITH DIFFERENTIAL/PLATELET - Abnormal; Notable for the following components:   WBC 13.0 (*)    RBC 3.75 (*)    Hemoglobin 11.0 (*)    MCV 102.4 (*)    MCHC 28.6 (*)    Neutro Abs 10.3 (*)    Abs Immature Granulocytes 0.26 (*)    All other components within normal limits  BRAIN NATRIURETIC PEPTIDE - Abnormal; Notable for the following components:   B Natriuretic Peptide 425.4 (*)    All other components within normal limits  I-STAT VENOUS BLOOD GAS, ED - Abnormal; Notable for the following components:   pCO2, Ven 83.8 (*)    pO2, Ven 95 (*)    Bicarbonate 51.9 (*)    TCO2 >50 (*)     Acid-Base Excess 22.0 (*)    Sodium 129 (*)    Potassium 5.9 (*)    Calcium, Ion 1.00 (*)    All other components within normal limits  I-STAT ARTERIAL BLOOD GAS, ED - Abnormal; Notable for the following components:   pCO2 arterial 85.9 (*)    pO2, Arterial 68 (*)    Bicarbonate 49.2 (*)    TCO2 >50 (*)    Acid-Base Excess 19.0 (*)    Sodium 131 (*)    Potassium 3.4 (*)    Calcium, Ion 1.07 (*)    All other components within normal limits  TROPONIN I (HIGH SENSITIVITY) - Abnormal; Notable for the following components:   Troponin I (High Sensitivity) 36 (*)    All other components within normal limits  TROPONIN I (HIGH SENSITIVITY) - Abnormal; Notable for the following components:   Troponin I (High Sensitivity) 32 (*)    All other components within normal limits  RESP PANEL BY RT-PCR (RSV, FLU A&B, COVID)  RVPGX2  RESPIRATORY PANEL BY PCR  PROCALCITONIN  URINALYSIS, ROUTINE W REFLEX MICROSCOPIC  BLOOD GAS, VENOUS  BLOOD GAS, ARTERIAL  CBC  BASIC METABOLIC PANEL                                                                                                                          Radiology DG Chest Portable 1 View  Result Date: 07/19/2023 CLINICAL DATA:  72 year old female with history of respiratory distress and low oxygen saturations. Possible/pulmonary edema. EXAM: PORTABLE CHEST 1 VIEW COMPARISON:  Chest x-ray 07/11/2023. FINDINGS: Lung volumes are normal. Widespread interstitial prominence and patchy peribronchial cuffing noted throughout the lungs bilaterally, most severe in the mid to lower lungs, where there also new areas of architectural distortion in ill-defined opacities, which correspond to areas of apparent post infectious or inflammatory scarring noted on prior chest CT. Chronic pleural-parenchymal thickening in the right hemithorax with some calcified right-sided pleural plaques, similar to the prior study. No definite left pleural effusion. No pneumothorax. No  definite cephalization of the pulmonary vasculature. Heart size is mildly enlarged.  IMPRESSION: 1. Radiographic appearance the chest appears similar to the prior study, with exception of increasing interstitial prominence and diffuse peribronchial cuffing, suggesting a background of acute bronchitis. Electronically Signed   By: Trudie Reed M.D.   On: 07/19/2023 05:27    Pertinent labs & imaging results that were available during my care of the patient were reviewed by me and considered in my medical decision making (see MDM for details).  Medications Ordered in ED Medications  enoxaparin (LOVENOX) injection 40 mg (40 mg Subcutaneous Given 07/19/23 1042)  sodium chloride flush (NS) 0.9 % injection 3 mL (3 mLs Intravenous Given 07/19/23 1043)  acetaminophen (TYLENOL) tablet 650 mg (650 mg Oral Given 07/19/23 1329)    Or  acetaminophen (TYLENOL) suppository 650 mg ( Rectal See Alternative 07/19/23 1329)  albuterol (PROVENTIL) (2.5 MG/3ML) 0.083% nebulizer solution 2.5 mg (has no administration in time range)  ondansetron (ZOFRAN) tablet 4 mg (4 mg Oral Given 07/19/23 1329)    Or  ondansetron (ZOFRAN) injection 4 mg ( Intravenous See Alternative 07/19/23 1329)  ipratropium-albuterol (DUONEB) 0.5-2.5 (3) MG/3ML nebulizer solution 3 mL (3 mLs Nebulization Not Given 07/19/23 2024)  cefTRIAXone (ROCEPHIN) 2 g in sodium chloride 0.9 % 100 mL IVPB (0 g Intravenous Stopped 07/19/23 1240)  azithromycin (ZITHROMAX) tablet 500 mg (500 mg Oral Given 07/19/23 1121)  guaiFENesin (MUCINEX) 12 hr tablet 600 mg (600 mg Oral Patient Refused/Not Given 07/19/23 1218)  predniSONE (DELTASONE) tablet 40 mg (has no administration in time range)  methylPREDNISolone sodium succinate (SOLU-MEDROL) 40 mg/mL injection 40 mg (has no administration in time range)  diltiazem (CARDIZEM CD) 24 hr capsule 180 mg (180 mg Oral Given 07/19/23 1354)  atorvastatin (LIPITOR) tablet 80 mg (80 mg Oral Given 07/19/23 1400)  losartan (COZAAR)  tablet 50 mg (50 mg Oral Given 07/19/23 1925)  escitalopram (LEXAPRO) tablet 5 mg (5 mg Oral Given 07/19/23 1354)  pantoprazole (PROTONIX) EC tablet 20 mg (20 mg Oral Given 07/19/23 1354)  hydrALAZINE (APRESOLINE) injection 10 mg (has no administration in time range)  HYDROcodone-acetaminophen (NORCO) 10-325 MG per tablet 1 tablet (1 tablet Oral Given 07/19/23 1354)  ALPRAZolam (XANAX) tablet 0.25 mg (0.25 mg Oral Given 07/19/23 1329)  budesonide (PULMICORT) nebulizer solution 0.5 mg (0.5 mg Nebulization Given 07/19/23 1330)  acetaZOLAMIDE (DIAMOX) injection 250 mg (has no administration in time range)  sterile water (preservative free) injection (has no administration in time range)  fentaNYL (SUBLIMAZE) injection 50 mcg (50 mcg Intravenous Given 07/19/23 0453)  albuterol (PROVENTIL) (2.5 MG/3ML) 0.083% nebulizer solution (10 mg/hr Nebulization Given 07/19/23 0447)  ipratropium (ATROVENT) nebulizer solution 0.5 mg (0.5 mg Nebulization Given 07/19/23 0447)  magnesium sulfate IVPB 2 g 50 mL (0 g Intravenous Stopped 07/19/23 0555)  furosemide (LASIX) injection 40 mg (40 mg Intravenous Given 07/19/23 0605)  Procedures .Critical Care  Performed by: Glendora Score, MD Authorized by: Glendora Score, MD   Critical care provider statement:    Critical care time (minutes):  30   Critical care was necessary to treat or prevent imminent or life-threatening deterioration of the following conditions:  Respiratory failure   Critical care was time spent personally by me on the following activities:  Development of treatment plan with patient or surrogate, discussions with consultants, evaluation of patient's response to treatment, examination of patient, ordering and review of laboratory studies, ordering and review of radiographic studies, ordering and performing treatments and  interventions, pulse oximetry, re-evaluation of patient's condition and review of old charts   (including critical care time)  Medical Decision Making / ED Course   This patient presents to the ED for concern of shortness of breath, this involves an extensive number of treatment options, and is a complaint that carries with it a high risk of complications and morbidity.  The differential diagnosis includes Pe, PTX, Pulmonary Edema, ARDS, COPD/Asthma, ACS, CHF exacerbation, Arrhythmia, Pericardial Effusion/Tamponade, Anemia, Sepsis, Acidosis/Hypercapnia, Anxiety, Viral URI  MDM: Patient seen emergency room for evaluation of shortness of breath.  Physical exam reveals tachypnea, wheezing bilaterally, bilateral lower pitting edema.  Laboratory evaluation with severe hypochloremia to 81 but this is not far from baseline for this patient, BNP is typically elevated to 425.4 which is uptrending, leukocytosis to 13, hemoglobin 11.0, chest x-ray with diffuse peribronchial cuffing.  Initial blood gas with significant hypercarbia but appropriate compensation and pH 7.4.  Patient received additional continuous albuterol treatment with 1 additional Atrovent treatment and wheezing that started to resolve.  Patient requiring 5 L nasal cannula to maintain oxygen saturations which is an increase from her baseline 3 L.  Diuresis also begun and patient require hospital mission for new oxygen requirement and mixed COPD and CHF exacerbation.   Additional history obtained:  -External records from outside source obtained and reviewed including: Chart review including previous notes, labs, imaging, consultation notes   Lab Tests: -I ordered, reviewed, and interpreted labs.   The pertinent results include:   Labs Reviewed  COMPREHENSIVE METABOLIC PANEL - Abnormal; Notable for the following components:      Result Value   Chloride 81 (*)    CO2 39 (*)    Glucose, Bld 152 (*)    Calcium 8.7 (*)    All other  components within normal limits  CBC WITH DIFFERENTIAL/PLATELET - Abnormal; Notable for the following components:   WBC 13.0 (*)    RBC 3.75 (*)    Hemoglobin 11.0 (*)    MCV 102.4 (*)    MCHC 28.6 (*)    Neutro Abs 10.3 (*)    Abs Immature Granulocytes 0.26 (*)    All other components within normal limits  BRAIN NATRIURETIC PEPTIDE - Abnormal; Notable for the following components:   B Natriuretic Peptide 425.4 (*)    All other components within normal limits  I-STAT VENOUS BLOOD GAS, ED - Abnormal; Notable for the following components:   pCO2, Ven 83.8 (*)    pO2, Ven 95 (*)    Bicarbonate 51.9 (*)    TCO2 >50 (*)    Acid-Base Excess 22.0 (*)    Sodium 129 (*)    Potassium 5.9 (*)    Calcium, Ion 1.00 (*)    All other components within normal limits  I-STAT ARTERIAL BLOOD GAS, ED - Abnormal; Notable for the following components:   pCO2 arterial 85.9 (*)  pO2, Arterial 68 (*)    Bicarbonate 49.2 (*)    TCO2 >50 (*)    Acid-Base Excess 19.0 (*)    Sodium 131 (*)    Potassium 3.4 (*)    Calcium, Ion 1.07 (*)    All other components within normal limits  TROPONIN I (HIGH SENSITIVITY) - Abnormal; Notable for the following components:   Troponin I (High Sensitivity) 36 (*)    All other components within normal limits  TROPONIN I (HIGH SENSITIVITY) - Abnormal; Notable for the following components:   Troponin I (High Sensitivity) 32 (*)    All other components within normal limits  RESP PANEL BY RT-PCR (RSV, FLU A&B, COVID)  RVPGX2  RESPIRATORY PANEL BY PCR  PROCALCITONIN  URINALYSIS, ROUTINE W REFLEX MICROSCOPIC  BLOOD GAS, VENOUS  BLOOD GAS, ARTERIAL  CBC  BASIC METABOLIC PANEL      EKG   EKG Interpretation Date/Time:  Wednesday July 19 2023 03:52:18 EDT Ventricular Rate:  97 PR Interval:  120 QRS Duration:  84 QT Interval:  371 QTC Calculation: 472 R Axis:   63  Text Interpretation: Sinus rhythm LAE, consider biatrial enlargement Confirmed by Jabria Loos  ([id number]) on 07/19/2023 8:36:58 PM         Imaging Studies ordered: I ordered imaging studies including chest x-ray I independently visualized and interpreted imaging. I agree with the radiologist interpretation   Medicines ordered and prescription drug management: Meds ordered this encounter  Medications   fentaNYL (SUBLIMAZE) injection 50 mcg   albuterol (PROVENTIL) (2.5 MG/3ML) 0.083% nebulizer solution   ipratropium (ATROVENT) nebulizer solution 0.5 mg   magnesium sulfate IVPB 2 g 50 mL   furosemide (LASIX) injection 40 mg   enoxaparin (LOVENOX) injection 40 mg   sodium chloride flush (NS) 0.9 % injection 3 mL   OR Linked Order Group    acetaminophen (TYLENOL) tablet 650 mg    acetaminophen (TYLENOL) suppository 650 mg   albuterol (PROVENTIL) (2.5 MG/3ML) 0.083% nebulizer solution 2.5 mg   OR Linked Order Group    ondansetron (ZOFRAN) tablet 4 mg    ondansetron (ZOFRAN) injection 4 mg   ipratropium-albuterol (DUONEB) 0.5-2.5 (3) MG/3ML nebulizer solution 3 mL   DISCONTD: predniSONE (DELTASONE) tablet 40 mg   cefTRIAXone (ROCEPHIN) 2 g in sodium chloride 0.9 % 100 mL IVPB    Order Specific Question:   Antibiotic Indication:    Answer:   Other Indication (list below)    Order Specific Question:   Other Indication:    Answer:   copd   azithromycin (ZITHROMAX) tablet 500 mg   guaiFENesin (MUCINEX) 12 hr tablet 600 mg   predniSONE (DELTASONE) tablet 40 mg   methylPREDNISolone sodium succinate (SOLU-MEDROL) 40 mg/mL injection 40 mg    IV methylprednisolone will be converted to either a q12h or q24h frequency with the same total daily dose (TDD).  Ordered Dose: 1 to 125 mg TDD; convert to: TDD q24h.  Ordered Dose: 126 to 250 mg TDD; convert to: TDD div q12h.  Ordered Dose: >250 mg TDD; DAW.   diltiazem (CARDIZEM CD) 24 hr capsule 180 mg   atorvastatin (LIPITOR) tablet 80 mg   losartan (COZAAR) tablet 50 mg   escitalopram (LEXAPRO) tablet 5 mg   pantoprazole (PROTONIX) EC  tablet 20 mg   hydrALAZINE (APRESOLINE) injection 10 mg   HYDROcodone-acetaminophen (NORCO) 10-325 MG per tablet 1 tablet   ALPRAZolam (XANAX) tablet 0.25 mg   budesonide (PULMICORT) nebulizer solution 0.5 mg   acetaZOLAMIDE (DIAMOX) injection  250 mg   sterile water (preservative free) injection    Fenton Malling A: cabinet override    -I have reviewed the patients home medicines and have made adjustments as needed  Critical interventions DuoNebs, albuterol, Lasix     Cardiac Monitoring: The patient was maintained on a cardiac monitor.  I personally viewed and interpreted the cardiac monitored which showed an underlying rhythm of: NSR  Social Determinants of Health:  Factors impacting patients care include: none   Reevaluation: After the interventions noted above, I reevaluated the patient and found that they have :improved  Co morbidities that complicate the patient evaluation  Past Medical History:  Diagnosis Date   Anxiety    Atrophic vaginitis 10/09/2015   Baker's cyst    left leg   Blood in urine 10/09/2015   CHF (congestive heart failure) (HCC)    COPD (chronic obstructive pulmonary disease) (HCC)    Heart murmur    Hematuria 10/09/2015   Hypertension    Nicotine addiction 04/07/2014   Oxygen dependent    4L/min [place] all the time   Pneumonia    Urinary retention 07/01/2020   Urinary urgency 01/25/2016   Vaginal bleeding 10/09/2015      Dispostion: I considered admission for this patient, and given new oxygen requirement with mixed CHF COPD exacerbation patient require hospitalization     Final Clinical Impression(s) / ED Diagnoses Final diagnoses:  COPD exacerbation (HCC)  Acute congestive heart failure, unspecified heart failure type [facility])     @PCDICTATION @    Glendora Score, MD 07/19/23 2037

## 2023-07-19 NOTE — Progress Notes (Signed)
RT called to room for respiratory distress pt was placed on 3L [place] per home regimen Doesn't tolerate bipap

## 2023-07-19 NOTE — ED Triage Notes (Signed)
BIBA for SOB, when EMS arrived O2 was 67% on 3L, placed on NRB 15L and recovered to mid 90s, received 2 DuoNebs and 125 mg solumedrol, pt A&Ox4

## 2023-07-19 NOTE — H&P (Addendum)
History and Physical    Patient: [name] WUJ:[id number] DOB: Mar 20, 1951 DOA: 07/19/2023 DOS: the patient was seen and examined on 07/19/2023 PCP: Benita Stabile, MD  Patient coming from: Home via EMS  Chief Complaint:  Chief Complaint  Patient presents with   Shortness of Breath   HPI: [name] is a 72 y.o. female with medical history significant of hypertension, hyperlipidemia, unspecified CHF, COPD on home oxygen 3-4L, prior history of E. coli bacteremia, anxiety, urinary retention, and tobacco abuse who presented with acutely worsening shortness of breath and weakness.  Recently admitted in the hospital from 7/17-7/19 after having a fall at home with trauma to her head.  Noted to have acute metabolic encephalopathy which was thought secondary to fall with head trauma and had also been found to have concern for urinary tract infection growing E. coli for which patient received 2 days of Rocephin and was discharged home on nitrofurantoin.  She reported completing course of nitrofurantoin as prescribed.  Last night patient reported having progressively worsening shortness of breath yesterday evening.  Reported having associated symptoms of subjective fever, wheezing, substernal chest pain, and weakness for which she was unable to get up to go to the bathroom.  Denied having any nausea, vomiting, or leg swelling.  She has not smoked in several years.  On EMS  arrival patient noted to have O2 saturations of 67% on 3 L placed on a nonrebreather at 15 L with improvement in O2 saturations up to mid 90s.  Patient had received 2 DuoNeb breathing treatments and Solu-Medrol 125 mg IV.     In the emergency department patient was noted to be afebrile, respirations 15-28, blood pressures elevated up to 179/93, and O2 saturations currently maintained on 3 L of nasal cannula oxygen.  Labs significant for WBC 13, hemoglobin 11, CO2 39, glucose 152, calcium 8.7, BNP 425.4, high-sensitivity troponin  36->32. Venous blood gas significant for pH 7.4, pCO2 83.8, and O2 95.  Chest x-ray noted similar appearance with increased interstitial prominence and diffuse peribronchial cuffing suggestive of background acute bronchitis.  Patient has been given breathing treatments, magnesium sulfate 2 g IV, Lasix 40 mg IV, and fentanyl 50 mcg IV.   Review of Systems: As mentioned in the history of present illness. All other systems reviewed and are negative. Past Medical History:  Diagnosis Date   Anxiety    Atrophic vaginitis 10/09/2015   Baker's cyst    left leg   Blood in urine 10/09/2015   CHF (congestive heart failure) (HCC)    COPD (chronic obstructive pulmonary disease) (HCC)    Heart murmur    Hematuria 10/09/2015   Hypertension    Nicotine addiction 04/07/2014   Oxygen dependent    4L/min [place] all the time   Pneumonia    Urinary retention 07/01/2020   Urinary urgency 01/25/2016   Vaginal bleeding 10/09/2015   Past Surgical History:  Procedure Laterality Date   ABDOMINAL HYSTERECTOMY     CATARACT EXTRACTION Left 03/2015   CESAREAN SECTION     COLONOSCOPY  10/19/2004   Dr. Jena Gauss- internal hemorrhoids, o/w normal rectum, normal colon   COLONOSCOPY N/A 07/30/2014   NFA:OZHYQMV diverticulosis. Colonic polyp-removed TA. repeat TCS 07/2021   COLONOSCOPY WITH PROPOFOL N/A 03/29/2021   diverticulosis in sigmoid and descending colon, one 5 mm polyp ascending colon (adenoma), non-bleeding internal hemorrhoids. 5 year surveillance   ESOPHAGOGASTRODUODENOSCOPY N/A 07/30/2014   HQI:ONGEXB EGD   POLYPECTOMY  03/29/2021   Procedure: POLYPECTOMY;  Surgeon: Corbin Ade, MD;  Location: [facility];  Service: Endoscopy;;   RADIAL HEAD ARTHROPLASTY Left 06/23/2017   Procedure: RADIAL HEAD ARTHROPLASTY WITH LIGAMENT REPAIR;  Surgeon: Cammy Copa, MD;  Location: [facility] OR;  Service: Orthopedics;  Laterality: Left;   Social History:  reports that she quit smoking about 10 years ago. Her smoking use  included cigarettes. She started smoking about 50 years ago. She has been exposed to tobacco smoke. She has never used smokeless tobacco. She reports current alcohol use. She reports that she does not use drugs.  No Known Allergies  Family History  Adopted: Yes  Problem Relation Age of Onset   Cancer Sister        pancreatic   Migraines Daughter    Cancer Daughter        pre cancerous cells on cervix   Cancer Sister        breast cancer, colon   Blindness Maternal Grandfather    Other Brother        murdered   Other Sister        ruptured colon   Cancer Sister        breast, skin   Other Brother        was in MVA   Cancer Sister        pancreatic   Hypertension Sister    Other Sister        ruptured colon   Colon cancer Neg Hx     Prior to Admission medications   Medication Sig Start Date End Date Taking? Authorizing Provider  albuterol (PROVENTIL) (2.5 MG/3ML) 0.083% nebulizer solution Take 2.5 mg by nebulization every 4 (four) hours as needed for wheezing or shortness of breath.    [provider]  albuterol (VENTOLIN HFA) 108 (90 Base) MCG/ACT inhaler Inhale 2 puffs into the lungs every 6 (six) hours as needed for wheezing or shortness of breath. For shortness of breath 12/27/22   Nyoka Cowden, MD  ALPRAZolam Prudy Feeler) 0.25 MG tablet Take 0.25 mg by mouth 2 (two) times daily as needed for anxiety.    [provider]  aspirin EC 81 MG tablet Take 81 mg by mouth in the morning. Swallow whole.    [provider]  atorvastatin (LIPITOR) 80 MG tablet Take 1 tablet (80 mg total) by mouth daily. 07/07/22   Almon Hercules, MD  bethanechol (URECHOLINE) 10 MG tablet Take 1 tablet (10 mg total) by mouth in the morning and at bedtime. 06/08/23   McKenzie, Mardene Celeste, MD  Cyanocobalamin (VITAMIN B-12 CR) 1000 MCG TBCR Take 1 tablet by mouth daily. 07/07/22   [provider]  diltiazem (CARDIZEM CD) 180 MG 24 hr capsule Take 1 capsule (180 mg total) by mouth  daily. 07/08/22   Almon Hercules, MD  escitalopram (LEXAPRO) 5 MG tablet Take 5 mg by mouth daily.    [provider]  furosemide (LASIX) 20 MG tablet Take 1 tablet (20 mg total) by mouth daily. Patient taking differently: Take 20 mg by mouth daily as needed for fluid or edema. 07/08/22   Almon Hercules, MD  HYDROcodone-acetaminophen (NORCO) 10-325 MG tablet Take 1 tablet by mouth 4 (four) times daily as needed for moderate pain. 07/14/22   [provider]  losartan (COZAAR) 50 MG tablet Take 50 mg by mouth daily. 07/22/22   [provider]  melatonin 5 MG TABS Take 1 tablet (5 mg total) by mouth once as needed.  07/07/22   Almon Hercules, MD  mirtazapine (REMERON) 7.5 MG tablet Take 7.5 mg by mouth at bedtime as needed (Sleep). 02/09/23   [provider]  Multiple Vitamin (MULTIVITAMIN WITH MINERALS) TABS tablet Take 1 tablet by mouth in the morning.    [provider]  nitrofurantoin, macrocrystal-monohydrate, (MACROBID) 100 MG capsule Take 1 capsule (100 mg) by mouth 2 times daily for 5 days. 07/14/23 07/19/23  Hughie Closs, MD  pantoprazole (PROTONIX) 20 MG tablet Take 1 tablet (20 mg total) by mouth daily. 07/08/22   Almon Hercules, MD  tamsulosin (FLOMAX) 0.4 MG CAPS capsule Take 1 capsule (0.4 mg total) by mouth daily after supper. 01/17/23   McKenzie, Mardene Celeste, MD  TRELEGY ELLIPTA 100-62.5-25 MCG/INH AEPB Take 1 puff by mouth in the morning. 02/19/21   [provider]    Physical Exam: Vitals:   07/19/23 0415 07/19/23 0430 07/19/23 0500 07/19/23 0600  BP: (!) 163/79 (!) 157/64 (!) 141/60 120/64  Pulse: 92 88 89 88  Resp: 17 15 17 15   Temp:      SpO2: 99% 100% 99% 98%  Weight:      Height:         Constitutional: Elderly female who appears to be in no acute distress Eyes: PERRL, lids and conjunctivae normal.  Bruising around and underneath the right eye. ENMT: Mucous membranes are moist.  Fair dentition Neck: normal, supple.  No  appreciable JVD. Respiratory: Significantly decreased overall aeration.  Patient currently on 3 to liters of oxygen with O2 saturations around 90%. Cardiovascular: Regular rate and rhythm, no murmurs / rubs / gallops. No significant lower extremity edema. Abdomen: no tenderness, no masses palpated.  Bowel sounds positive.  Musculoskeletal: no clubbing / cyanosis. No joint deformity upper and lower extremities. Good ROM, no contractures. Normal muscle tone.  Skin: no rashes, lesions, ulcers. No induration Neurologic: CN 2-12 grossly intact.   Strength 5/5 in all 4.  Psychiatric: Normal judgment and insight. Alert and oriented x 3.  Anxious mood.   Data Reviewed:  EKG reveals sinus rhythm at  97 bpm with signs of biatrial enlargement and minimal ST depressions in the lateral leads.  Reviewed labs, imaging, and pertinent records as noted above in the HPI.  Assessment and Plan:  Acute on chronic respiratory failure with hypoxia and hypercapnia COPD exacerbation Patient was found to have O2 saturation was 67% on 3 L of oxygen on EMS arrival.  She had initially been placed on nonrebreather with EMS, but able to be weaned back down to 3 L.  ABG noted pH to be 7.364, pCO2 85.9, and pO2 68 for which she appears compensated and review of prior ABGs shows that this is chronic. Chest xray noted increased interstitial prominence and diffuse peribronchial cuffing suggestive of background acute bronchitis.  She had been given Solu-Medrol 125 mg IV, magnesium sulfate 2 g, and multiple breathing treatments while in the ED.  Patient declined need of follows with Dr. Sherene Sires in outpatient setting. -Admit to a progressive bed -Continuous pulse oximetry with oxygen to maintain O2 saturation greater than 90% -Incentive spirometry and flutter valve -Check procalcitonin -Follow-up respiratory virus panel -Empiric antibiotics of Rocephin and azithromycin.  De-escalate if lower suspicion for infection -DuoNebs 4 times  daily and albuterol nebs as needed -Solu-Medrol IV twice daily x 3 doses then transition to prednisone -Mucinex -Pulmonology consulted,  will follow-up for any further recommendations  Diastolic congestive heart failure exacerbation BNP elevated at  425, but  have ranged from 291.1-615 when about 2 weeks.  EF noted to be 55 to 60% with grade 1 diastolic dysfunction back in 06/2022.  Patient has been given Lasix 40 mg IV x 1 dose in case of fluid overload. -Strict I&Os and daily weights -Check echocardiogram  Leukocytosis Acute.  WBC elevated at 13.  Patient had received steroids prior to arrival. -Follow-up procalcitonin -Recheck CBC tomorrow morning  Elevated troponin Chronic.  High-sensitivity troponin 36->32.  Suspect possibly secondary to demand in setting of acute respiratory distress. -Continue to monitor  Essential hypertension Blood pressures initially elevated up to 179/93. -Continue losartan and Cardizem -Hydralazine IV as needed  History of E. coli UTI Prior to admission.  Patient has been treated with 2 days of Rocephin and discharged home to complete course of Macrobid.  She denies having any dysuria or urinary frequency symptoms. -Check urinalysis per patient request  Anxiety -Continue Lexapro and Xanax as needed for anxiety  Debility and weakness Patient reports that after being at home she was unable to care for self and had had recent fall that led to her prior hospitalization. -Physical and occupational therapy to eval and treat now  DVT prophylaxis: Lovenox Advance Care Planning:   Code Status: Full Code    Consults: Pulmonology at patient request  Family Communication: friend updated at bedside  Severity of Illness: The appropriate patient status for this patient is INPATIENT. Inpatient status is judged to be reasonable and necessary in order to provide the required intensity of service to ensure the patient's safety. The patient's presenting symptoms,  physical exam findings, and initial radiographic and laboratory data in the context of their chronic comorbidities is felt to place them at high risk for further clinical deterioration. Furthermore, it is not anticipated that the patient will be medically stable for discharge from the hospital within 2 midnights of admission.   * I certify that at the point of admission it is my clinical judgment that the patient will require inpatient hospital care spanning beyond 2 midnights from the point of admission due to high intensity of service, high risk for further deterioration and high frequency of surveillance required.*  Author: Clydie Braun, MD 07/19/2023 7:27 AM  For on call review [email or web address].

## 2023-07-19 NOTE — ED Notes (Signed)
Pt provided beverage and bag lunch

## 2023-07-20 ENCOUNTER — Telehealth: Payer: Self-pay | Admitting: Emergency Medicine

## 2023-07-20 DIAGNOSIS — J9602 Acute respiratory failure with hypercapnia: Secondary | ICD-10-CM

## 2023-07-20 DIAGNOSIS — J9601 Acute respiratory failure with hypoxia: Secondary | ICD-10-CM | POA: Diagnosis not present

## 2023-07-20 LAB — CBC
HCT: 36.5 % (ref 36.0–46.0)
Hemoglobin: 10.7 g/dL — ABNORMAL LOW (ref 12.0–15.0)
MCH: 29.2 pg (ref 26.0–34.0)
MCHC: 29.3 g/dL — ABNORMAL LOW (ref 30.0–36.0)
MCV: 99.7 fL (ref 80.0–100.0)
Platelets: 208 10*3/uL (ref 150–400)
RBC: 3.66 MIL/uL — ABNORMAL LOW (ref 3.87–5.11)
RDW: 12.7 % (ref 11.5–15.5)
WBC: 10.9 10*3/uL — ABNORMAL HIGH (ref 4.0–10.5)
nRBC: 0 % (ref 0.0–0.2)

## 2023-07-20 LAB — BASIC METABOLIC PANEL
BUN: 12 mg/dL (ref 8–23)
CO2: 41 mmol/L — ABNORMAL HIGH (ref 22–32)
Calcium: 8.4 mg/dL — ABNORMAL LOW (ref 8.9–10.3)
Chloride: 83 mmol/L — ABNORMAL LOW (ref 98–111)
Creatinine, Ser: 0.64 mg/dL (ref 0.44–1.00)
GFR, Estimated: >60 mL/min (ref >60)
Glucose, Bld: 132 mg/dL — ABNORMAL HIGH (ref 70–99)
Potassium: 3.9 mmol/L (ref 3.5–5.1)

## 2023-07-20 MED ORDER — ACETAZOLAMIDE SODIUM 500 MG IJ SOLR
250.0000 mg | Freq: Once | INTRAMUSCULAR | Status: AC
  Administered 2023-07-20: 250 mg via INTRAVENOUS
  Filled 2023-07-20: qty 250

## 2023-07-20 MED ORDER — POTASSIUM CHLORIDE CRYS ER 20 MEQ PO TBCR
20.0000 meq | EXTENDED_RELEASE_TABLET | Freq: Every day | ORAL | Status: DC
  Administered 2023-07-20 – 2023-07-24 (×5): 20 meq via ORAL
  Filled 2023-07-20 (×5): qty 1

## 2023-07-20 MED ORDER — FUROSEMIDE 20 MG PO TABS
20.0000 mg | ORAL_TABLET | Freq: Every day | ORAL | Status: DC
  Administered 2023-07-20 – 2023-07-24 (×5): 20 mg via ORAL
  Filled 2023-07-20 (×5): qty 1

## 2023-07-20 MED ORDER — STERILE WATER FOR INJECTION IJ SOLN
INTRAMUSCULAR | Status: AC
  Filled 2023-07-20: qty 10

## 2023-07-20 NOTE — Progress Notes (Signed)
PROGRESS NOTE    [name]  ZOX:[id number] DOB: 08/28/1951 DOA: 07/19/2023 PCP: Benita Stabile, MD    Brief Narrative:  72 year old with history of hypertension, hyperlipidemia, COPD and chronic hypoxemia on home oxygen 3 to 4 L, anxiety, untreated sleep apnea due to intolerance to CPAP who was recently admitted to the hospital after falling at home and also found to have E. coli UTI and discharged on nitrofurantoin presented back to the emergency room with worsening shortness of breath of 1 day.  Patient arrived to the ER 67% on 3 L oxygen and placed on nonrebreather with improvement.  Also received DuoNebs and Solu-Medrol.  Was afebrile.  Chest x-ray with peribronchial cuffing suggesting a background acute bronchitis.  She responded to breathing treatments and diuretics.  Also seen by pulmonary due to hypercapnia and possible need for BiPAP, however patient declined BiPAP and ultimately stabilized.  Admitted as COPD exacerbation.   Assessment & Plan:   Acute on chronic hypoxemic and hypercapnic respiratory failure: Multifactorial.  Untreated sleep apnea.  Chronic hypercapnia. Agree with admission to monitored unit because of severity of symptoms. Aggressive bronchodilator therapy, IV steroids, inhalational steroids, scheduled and as needed bronchodilators, deep breathing exercises, incentive spirometry, PT OT.   Will continue antibiotics for 5 days. Supplemental oxygen to keep saturations more than 90%. Declines BiPAP.  Currently not needing.  She has compensated chronic hypercapnia.  Suspected acute on chronic diastolic heart failure exacerbation: Unlikely.  Echocardiogram essentially normal.  Her symptoms are likely related to COPD exacerbation.  Resume home Lasix 20 mg daily.  Essential hypertension Home blood pressures elevated.  Resume losartan and Cardizem.  Anxiety disorder: Lexapro and Xanax.  Debility and disability: Work with PT OT.   DVT prophylaxis: enoxaparin  (LOVENOX) injection 40 mg Start: 07/19/23 1000   Code Status: Full code Family Communication: None at the bedside Disposition Plan: Status is: Inpatient Remains inpatient appropriate because: Significant shortness of breath and oxygen use     Consultants:  Critical care  Procedures:  None  Antimicrobials:  Rocephin 7/24---   Subjective: Patient seen in the morning rounds.  She is in the emergency room.  Patient tells me that she has been in the emergency room for last 2 days.  She has remained on oxygen 4 to 5 L.  Patient tells me her breathing is better but still feels tight in her chest. She is tired of laying in the emergency room stretcher.  Objective: Vitals:   07/20/23 0830 07/20/23 1115 07/20/23 1125 07/20/23 1139  BP:  (!) 95/58    Pulse: 81 85 79   Resp: 15     Temp:    97.9 F (36.6 C)  TempSrc:    Oral  SpO2: (!) 89%  93%   Weight:      Height:       No intake or output data in the 24 hours ending 07/20/23 1154 Filed Weights   07/19/23 0344  Weight: 85.6 kg    Examination:  General exam: Appears calm and comfortable  Able to talk in complete sentences. Patient has ecchymosis on her right eye from previous trauma. Respiratory system: Clear to auscultation. Poor inspiratory effort. SpO2: 93 % O2 Flow Rate (L/min): 3 L/min  Cardiovascular system: S1 & S2 heard, RRR. No JVD, murmurs, rubs, gallops or clicks. Gastrointestinal system: Soft.  Nontender.  Obese and pendulous.  Bowel sound present. Central nervous system: Alert and oriented. No focal neurological deficits. Extremities: Symmetric 5 x 5 power.  Data Reviewed: I have personally reviewed following labs and imaging studies  CBC: Recent Labs  Lab 07/14/23 0043 07/19/23 0358 07/19/23 0405 07/19/23 0803 07/20/23 0500  WBC 9.0 13.0*  --   --  10.9*  NEUTROABS 6.2 10.3*  --   --   --   HGB 10.6* 11.0* 12.9 12.2 10.7*  HCT 38.0 38.4 38.0 36.0 36.5  MCV 103.5* 102.4*  --   --  99.7   PLT 202 193  --   --  208   Basic Metabolic Panel: Recent Labs  Lab 07/14/23 0043 07/19/23 0358 07/19/23 0405 07/19/23 0803 07/20/23 0500  NA 138 135 129* 131* 134*  K 4.2 4.1 5.9* 3.4* 3.9  CL 90* 81*  --   --  83*  CO2 37* 39*  --   --  41*  GLUCOSE 97 152*  --   --  132*  BUN 16 10  --   --  12  CREATININE 0.60 0.57  --   --  0.64  CALCIUM 8.6* 8.7*  --   --  8.4*  MG 2.3  --   --   --   --   PHOS 4.7*  --   --   --   --    GFR: Estimated Creatinine Clearance: 68.8 mL/min (by C-G formula based on SCr of 0.64 mg/dL). Liver Function Tests: Recent Labs  Lab 07/19/23 0358  AST 18  ALT 21  ALKPHOS 68  BILITOT 0.6  PROT 6.7  ALBUMIN 3.6   No results for input(s): "LIPASE", "AMYLASE" in the last 168 hours. No results for input(s): "AMMONIA" in the last 168 hours. Coagulation Profile: No results for input(s): "INR", "PROTIME" in the last 168 hours. Cardiac Enzymes: No results for input(s): "CKTOTAL", "CKMB", "CKMBINDEX", "TROPONINI" in the last 168 hours. BNP (last 3 results) No results for input(s): "PROBNP" in the last 8760 hours. HbA1C: No results for input(s): "HGBA1C" in the last 72 hours. CBG: No results for input(s): "GLUCAP" in the last 168 hours. Lipid Profile: No results for input(s): "CHOL", "HDL", "LDLCALC", "TRIG", "CHOLHDL", "LDLDIRECT" in the last 72 hours. Thyroid Function Tests: No results for input(s): "TSH", "T4TOTAL", "FREET4", "T3FREE", "THYROIDAB" in the last 72 hours. Anemia Panel: No results for input(s): "VITAMINB12", "FOLATE", "FERRITIN", "TIBC", "IRON", "RETICCTPCT" in the last 72 hours. Sepsis Labs: Recent Labs  Lab 07/19/23 0607  PROCALCITON <0.10    Recent Results (from the past 240 hour(s))  Urine Culture     Status: Abnormal   Collection Time: 07/11/23  9:03 PM   Specimen: Urine, Random  Result Value Ref Range Status   Specimen Description   Final    URINE, RANDOM Performed at [facility], [address], [place], [place] [address]    Special Requests   Final    NONE Reflexed from [id number] Performed at [facility], [address] [place], [place] [address]    Culture >=100,000 COLONIES/mL ESCHERICHIA COLI (A)  Final   Report Status 07/14/2023 FINAL  Final   Organism ID, Bacteria ESCHERICHIA COLI (A)  Final      Susceptibility   Escherichia coli - MIC*    AMPICILLIN >=32 RESISTANT Resistant     CEFAZOLIN 16 SENSITIVE Sensitive     CEFEPIME <=0.12 SENSITIVE Sensitive     CEFTRIAXONE 0.5 SENSITIVE Sensitive     CIPROFLOXACIN <=0.25 SENSITIVE Sensitive     GENTAMICIN <=1 SENSITIVE Sensitive     IMIPENEM <=0.25 SENSITIVE Sensitive  NITROFURANTOIN <=16 SENSITIVE Sensitive     TRIMETH/SULFA <=20 SENSITIVE Sensitive     AMPICILLIN/SULBACTAM >=32 RESISTANT Resistant     PIP/TAZO 8 SENSITIVE Sensitive     * >=100,000 COLONIES/mL ESCHERICHIA COLI  Resp panel by RT-PCR (RSV, Flu A&B, Covid) Anterior Nasal Swab     Status: None   Collection Time: 07/19/23 11:32 AM   Specimen: Anterior Nasal Swab  Result Value Ref Range Status   SARS Coronavirus 2 by RT PCR NEGATIVE NEGATIVE Final   Influenza A by PCR NEGATIVE NEGATIVE Final   Influenza B by PCR NEGATIVE NEGATIVE Final    Comment: (NOTE) The Xpert Xpress SARS-CoV-2/FLU/RSV plus assay is intended as an aid in the diagnosis of influenza from Nasopharyngeal swab specimens and should not be used as a sole basis for treatment. Nasal washings and aspirates are unacceptable for Xpert Xpress SARS-CoV-2/FLU/RSV testing.  Fact Sheet for Patients: BloggerCourse.com  Fact Sheet for Healthcare Providers: SeriousBroker.it  This test is not yet approved or cleared by the [place] FDA and has been authorized for detection and/or diagnosis of SARS-CoV-2 by FDA under an Emergency Use Authorization (EUA). This EUA will remain in effect (meaning this test can be used) for  the duration of the COVID-19 declaration under Section 564(b)(1) of the Act, 21 U.S.C. section 360bbb-3(b)(1), unless the authorization is terminated or revoked.     Resp Syncytial Virus by PCR NEGATIVE NEGATIVE Final    Comment: (NOTE) Fact Sheet for Patients: BloggerCourse.com  Fact Sheet for Healthcare Providers: SeriousBroker.it  This test is not yet approved or cleared by the [place] FDA and has been authorized for detection and/or diagnosis of SARS-CoV-2 by FDA under an Emergency Use Authorization (EUA). This EUA will remain in effect (meaning this test can be used) for the duration of the COVID-19 declaration under Section 564(b)(1) of the Act, 21 U.S.C. section 360bbb-3(b)(1), unless the authorization is terminated or revoked.  Performed at [facility], [address] [place], [place] [address]   Respiratory (~20 pathogens) panel by PCR     Status: None   Collection Time: 07/19/23 11:32 AM   Specimen: Anterior Nasal Swab; Respiratory  Result Value Ref Range Status   Adenovirus NOT DETECTED NOT DETECTED Final   Coronavirus 229E NOT DETECTED NOT DETECTED Final    Comment: (NOTE) The Coronavirus on the Respiratory Panel, DOES NOT test for the novel  Coronavirus (2019 nCoV)    Coronavirus HKU1 NOT DETECTED NOT DETECTED Final   Coronavirus NL63 NOT DETECTED NOT DETECTED Final   Coronavirus OC43 NOT DETECTED NOT DETECTED Final   Metapneumovirus NOT DETECTED NOT DETECTED Final   Rhinovirus / Enterovirus NOT DETECTED NOT DETECTED Final   Influenza A NOT DETECTED NOT DETECTED Final   Influenza B NOT DETECTED NOT DETECTED Final   Parainfluenza Virus 1 NOT DETECTED NOT DETECTED Final   Parainfluenza Virus 2 NOT DETECTED NOT DETECTED Final   Parainfluenza Virus 3 NOT DETECTED NOT DETECTED Final   Parainfluenza Virus 4 NOT DETECTED NOT DETECTED Final   Respiratory Syncytial Virus NOT DETECTED NOT DETECTED Final    Bordetella pertussis NOT DETECTED NOT DETECTED Final   Bordetella Parapertussis NOT DETECTED NOT DETECTED Final   Chlamydophila pneumoniae NOT DETECTED NOT DETECTED Final   Mycoplasma pneumoniae NOT DETECTED NOT DETECTED Final    Comment: Performed at [facility], [address], [place], [place] [address]         Radiology Studies: ECHOCARDIOGRAM COMPLETE  Result Date: 07/19/2023  ECHOCARDIOGRAM REPORT   Patient Name:   [name] Date of Exam: 07/19/2023 Medical Rec #:  [id number]        Height:       63.0 in Accession #:    [id number]       Weight:       188.8 lb Date of Birth:  February 08, 1951        BSA:          1.887 m Patient Age:    69 years         BP:           133/77 mmHg Patient Gender: F                HR:           100 bpm. Exam Location:  Inpatient Procedure: 2D Echo, Cardiac Doppler and Color Doppler Indications:    CHF  History:        Patient has prior history of Echocardiogram examinations, most                 recent 06/30/2022. CHF, COPD, Signs/Symptoms:Murmur; Risk                 Factors:Current Smoker.  Sonographer:    Darlys Gales Referring Phys: [id number] RONDELL A SMITH IMPRESSIONS  1. Left ventricular ejection fraction, by estimation, is 55 to 60%. The left ventricle has normal function. The left ventricle has no regional wall motion abnormalities. Left ventricular diastolic parameters are consistent with Grade II diastolic dysfunction (pseudonormalization).  2. Right ventricular systolic function was not well visualized. The right ventricular size is not well visualized.  3. Left atrial size was moderately dilated.  4. The mitral valve is grossly normal. Trivial mitral valve regurgitation. No evidence of mitral stenosis. Moderate mitral annular calcification.  5. The aortic valve is grossly normal. There is mild calcification of the aortic valve. There is mild thickening of the aortic valve. Aortic valve regurgitation is not visualized. Mild aortic valve stenosis.  6.  The inferior vena cava is normal in size with greater than 50% respiratory variability, suggesting right atrial pressure of 3 mmHg. Comparison(s): No significant change from prior study. FINDINGS  Left Ventricle: Left ventricular ejection fraction, by estimation, is 55 to 60%. The left ventricle has normal function. The left ventricle has no regional wall motion abnormalities. The left ventricular internal cavity size was normal in size. There is  no left ventricular hypertrophy. Left ventricular diastolic parameters are consistent with Grade II diastolic dysfunction (pseudonormalization). Right Ventricle: The right ventricular size is not well visualized. Right vetricular wall thickness was not assessed. Right ventricular systolic function was not well visualized. Left Atrium: Left atrial size was moderately dilated. Right Atrium: Right atrial size was not well visualized. Pericardium: There is no evidence of pericardial effusion. Mitral Valve: The mitral valve is grossly normal. Moderate mitral annular calcification. Trivial mitral valve regurgitation. No evidence of mitral valve stenosis. Tricuspid Valve: The tricuspid valve is not well visualized. Tricuspid valve regurgitation is trivial. No evidence of tricuspid stenosis. Aortic Valve: The aortic valve is grossly normal. There is mild calcification of the aortic valve. There is mild thickening of the aortic valve. Aortic valve regurgitation is not visualized. Mild aortic stenosis is present. Aortic valve mean gradient measures 13.0 mmHg. Aortic valve peak gradient measures 24.4 mmHg. Aortic valve area, by VTI measures 2.23 cm. Pulmonic Valve: The pulmonic valve was not well visualized. Pulmonic valve regurgitation is not visualized.  Aorta: The aortic root was not well visualized and the ascending aorta was not well visualized. Venous: The inferior vena cava is normal in size with greater than 50% respiratory variability, suggesting right atrial pressure of 3  mmHg. IAS/Shunts: The interatrial septum was not well visualized.  LEFT VENTRICLE PLAX 2D LVIDd:         4.00 cm   Diastology LVIDs:         3.00 cm   LV e' medial:    5.55 cm/s LV PW:         1.00 cm   LV E/e' medial:  24.7 LV IVS:        1.00 cm   LV e' lateral:   6.85 cm/s LVOT diam:     1.80 cm   LV E/e' lateral: 20.0 LV SV:         105 LV SV Index:   56 LVOT Area:     2.54 cm  LEFT ATRIUM             Index LA Vol (A2C):   41.7 ml 22.10 ml/m LA Vol (A4C):   78.1 ml 41.39 ml/m LA Biplane Vol: 58.4 ml 30.95 ml/m  AORTIC VALVE AV Area (Vmax):    2.05 cm AV Area (Vmean):   2.03 cm AV Area (VTI):     2.23 cm AV Vmax:           247.00 cm/s AV Vmean:          168.000 cm/s AV VTI:            0.471 m AV Peak Grad:      24.4 mmHg AV Mean Grad:      13.0 mmHg LVOT Vmax:         199.00 cm/s LVOT Vmean:        134.000 cm/s LVOT VTI:          0.413 m LVOT/AV VTI ratio: 0.88 MITRAL VALVE MV Area (PHT): 4.65 cm     SHUNTS MV Decel Time: 163 msec     Systemic VTI:  0.41 m MV E velocity: 137.00 cm/s  Systemic Diam: 1.80 cm MV A velocity: 146.00 cm/s MV E/A ratio:  0.94 Jodelle Red MD Electronically signed by Jodelle Red MD Signature Date/Time: 07/19/2023/10:50:20 PM    Final    DG Chest Portable 1 View  Result Date: 07/19/2023 CLINICAL DATA:  72 year old female with history of respiratory distress and low oxygen saturations. Possible/pulmonary edema. EXAM: PORTABLE CHEST 1 VIEW COMPARISON:  Chest x-ray 07/11/2023. FINDINGS: Lung volumes are normal. Widespread interstitial prominence and patchy peribronchial cuffing noted throughout the lungs bilaterally, most severe in the mid to lower lungs, where there also new areas of architectural distortion in ill-defined opacities, which correspond to areas of apparent post infectious or inflammatory scarring noted on prior chest CT. Chronic pleural-parenchymal thickening in the right hemithorax with some calcified right-sided pleural plaques, similar to the  prior study. No definite left pleural effusion. No pneumothorax. No definite cephalization of the pulmonary vasculature. Heart size is mildly enlarged. IMPRESSION: 1. Radiographic appearance the chest appears similar to the prior study, with exception of increasing interstitial prominence and diffuse peribronchial cuffing, suggesting a background of acute bronchitis. Electronically Signed   By: Trudie Reed M.D.   On: 07/19/2023 05:27        Scheduled Meds:  acetaZOLAMIDE  250 mg Intravenous Daily   atorvastatin  80 mg Oral Daily   azithromycin  500 mg Oral Daily  budesonide (PULMICORT) nebulizer solution  0.5 mg Nebulization BID   diltiazem  180 mg Oral Daily   enoxaparin (LOVENOX) injection  40 mg Subcutaneous Q24H   escitalopram  5 mg Oral Daily   furosemide  20 mg Oral Daily   guaiFENesin  600 mg Oral BID   ipratropium-albuterol  3 mL Nebulization QID   losartan  50 mg Oral Daily   methylPREDNISolone (SOLU-MEDROL) injection  40 mg Intravenous Q12H   pantoprazole  20 mg Oral Daily   potassium chloride  20 mEq Oral Daily   [START ON 07/21/2023] predniSONE  40 mg Oral Q breakfast   sodium chloride flush  3 mL Intravenous Q12H   Continuous Infusions:  cefTRIAXone (ROCEPHIN)  IV Stopped (07/20/23 1154)     LOS: 1 day    Time spent: 35 minutes    Dorcas Carrow, MD [facility]

## 2023-07-20 NOTE — ED Notes (Signed)
ED TO INPATIENT HANDOFF REPORT  ED Nurse Name and Phone #: Rhys Lichty [phone]  S Name/Age/Gender [name] 72 y.o. female Room/Bed: 042C/042C  Code Status   Code Status: Full Code  Home/SNF/Other Home Patient oriented to: self, place, time, and situation Is this baseline? Yes   Triage Complete: Triage complete  Chief Complaint CHF (congestive heart failure) (HCC) [I50.9] Acute respiratory failure with hypoxia and hypercapnia (HCC) [J96.01, J96.02] COPD with acute exacerbation (HCC) [J44.1]  Triage Note BIBA for SOB, when EMS arrived O2 was 67% on 3L, placed on NRB 15L and recovered to mid 90s, received 2 DuoNebs and 125 mg solumedrol, pt A&Ox4   Allergies No Known Allergies  Level of Care/Admitting Diagnosis ED Disposition     ED Disposition  Admit   Condition  --   Comment  Hospital [facility] [[id number]]  Level of Care: Telemetry Medical [104]  May admit patient to [facility] or [facility] if equivalent level of care is available:: No  Covid Evaluation: Confirmed COVID Negative  Diagnosis: COPD with acute exacerbation [facility]) [[id number]]  Admitting Physician: Dorcas Carrow [[id number]]  Attending Physician: Dorcas Carrow [[id number]]  Certification:: I certify this patient will need inpatient services for at least 2 midnights          B Medical/Surgery History Past Medical History:  Diagnosis Date   Anxiety    Atrophic vaginitis 10/09/2015   Baker's cyst    left leg   Blood in urine 10/09/2015   CHF (congestive heart failure) (HCC)    COPD (chronic obstructive pulmonary disease) (HCC)    Heart murmur    Hematuria 10/09/2015   Hypertension    Nicotine addiction 04/07/2014   Oxygen dependent    4L/min [place] all the time   Pneumonia    Urinary retention 07/01/2020   Urinary urgency 01/25/2016   Vaginal bleeding 10/09/2015   Past Surgical History:  Procedure Laterality Date   ABDOMINAL HYSTERECTOMY     CATARACT EXTRACTION Left  03/2015   CESAREAN SECTION     COLONOSCOPY  10/19/2004   Dr. Jena Gauss- internal hemorrhoids, o/w normal rectum, normal colon   COLONOSCOPY N/A 07/30/2014   MVH:QIONGEX diverticulosis. Colonic polyp-removed TA. repeat TCS 07/2021   COLONOSCOPY WITH PROPOFOL N/A 03/29/2021   diverticulosis in sigmoid and descending colon, one 5 mm polyp ascending colon (adenoma), non-bleeding internal hemorrhoids. 5 year surveillance   ESOPHAGOGASTRODUODENOSCOPY N/A 07/30/2014   BMW:[id number] EGD   POLYPECTOMY  03/29/2021   Procedure: POLYPECTOMY;  Surgeon: Corbin Ade, MD;  Location: [facility];  Service: Endoscopy;;   RADIAL HEAD ARTHROPLASTY Left 06/23/2017   Procedure: RADIAL HEAD ARTHROPLASTY WITH LIGAMENT REPAIR;  Surgeon: Cammy Copa, MD;  Location: [facility] OR;  Service: Orthopedics;  Laterality: Left;     A IV Location/Drains/Wounds Patient Lines/Drains/Airways Status     Active Line/Drains/Airways     Name Placement date Placement time Site Days   Peripheral IV 07/19/23 20 G 1" Left Forearm 07/19/23  2023  Forearm  1            Intake/Output Last 24 hours  Intake/Output Summary (Last 24 hours) at 07/20/2023 1605 Last data filed at 07/20/2023 1311 Gross per 24 hour  Intake --  Output 600 ml  Net -600 ml    Labs/Imaging Results for orders placed or performed during the hospital encounter of 07/19/23 (from the past 48 hour(s))  Comprehensive metabolic panel     Status: Abnormal   Collection Time: 07/19/23  3:58 AM  Result Value Ref Range   Sodium 135 135 - 145 mmol/L   Potassium 4.1 3.5 - 5.1 mmol/L   Chloride 81 (L) 98 - 111 mmol/L   CO2 39 (H) 22 - 32 mmol/L   Glucose, Bld 152 (H) 70 - 99 mg/dL    Comment: Glucose reference range applies only to samples taken after fasting for at least 8 hours.   BUN 10 8 - 23 mg/dL   Creatinine, Ser 2.95 0.44 - 1.00 mg/dL   Calcium 8.7 (L) 8.9 - 10.3 mg/dL   Total Protein 6.7 6.5 - 8.1 g/dL   Albumin 3.6 3.5 - 5.0 g/dL   AST 18 15 - 41 U/L    ALT 21 0 - 44 U/L   Alkaline Phosphatase 68 38 - 126 U/L   Total Bilirubin 0.6 0.3 - 1.2 mg/dL   GFR, Estimated >62 >13 mL/min    Comment: (NOTE) Calculated using the CKD-EPI Creatinine Equation (2021)    Anion gap 15 5 - 15    Comment: Performed at [facility], [address] [place] [address], [place], [place] [address]  CBC with Differential     Status: Abnormal   Collection Time: 07/19/23  3:58 AM  Result Value Ref Range   WBC 13.0 (H) 4.0 - 10.5 K/uL   RBC 3.75 (L) 3.87 - 5.11 MIL/uL   Hemoglobin 11.0 (L) 12.0 - 15.0 g/dL   HCT 84.6 96.2 - 95.2 %   MCV 102.4 (H) 80.0 - 100.0 fL   MCH 29.3 26.0 - 34.0 pg   MCHC 28.6 (L) 30.0 - 36.0 g/dL   RDW 84.1 32.4 - 40.1 %   Platelets 193 150 - 400 K/uL   nRBC 0.2 0.0 - 0.2 %   Neutrophils Relative % 78 %   Neutro Abs 10.3 (H) 1.7 - 7.7 K/uL   Lymphocytes Relative 10 %   Lymphs Abs 1.3 0.7 - 4.0 K/uL   Monocytes Relative 8 %   Monocytes Absolute 1.0 0.1 - 1.0 K/uL   Eosinophils Relative 1 %   Eosinophils Absolute 0.1 0.0 - 0.5 K/uL   Basophils Relative 1 %   Basophils Absolute 0.1 0.0 - 0.1 K/uL   Immature Granulocytes 2 %   Abs Immature Granulocytes 0.26 (H) 0.00 - 0.07 K/uL    Comment: Performed at [facility], [address] [place], [place] [address]  Troponin I (High Sensitivity)     Status: Abnormal   Collection Time: 07/19/23  3:58 AM  Result Value Ref Range   Troponin I (High Sensitivity) 36 (H) <18 ng/L    Comment: (NOTE) Elevated high sensitivity troponin I (hsTnI) values and significant  changes across serial measurements may suggest ACS but many other  chronic and acute conditions are known to elevate hsTnI results.  Refer to the "Links" section for chest pain algorithms and additional  guidance. Performed at [facility], [address] [place], [place] [address]   Brain natriuretic peptide     Status: Abnormal   Collection Time: 07/19/23  3:58 AM  Result Value Ref Range   B Natriuretic Peptide 425.4 (H)  0.0 - 100.0 pg/mL    Comment: Performed at [facility], [address], [place], [place] [address]  I-Stat venous blood gas, ED     Status: Abnormal   Collection Time: 07/19/23  4:05 AM  Result Value Ref Range   pH, Ven 7.400 7.25 - 7.43   pCO2, Ven 83.8 (  HH) 44 - 60 mmHg   pO2, Ven 95 (H) 32 - 45 mmHg   Bicarbonate 51.9 (H) 20.0 - 28.0 mmol/L   TCO2 >50 (H) 22 - 32 mmol/L   O2 Saturation 97 %   Acid-Base Excess 22.0 (H) 0.0 - 2.0 mmol/L   Sodium 129 (L) 135 - 145 mmol/L   Potassium 5.9 (H) 3.5 - 5.1 mmol/L   Calcium, Ion 1.00 (L) 1.15 - 1.40 mmol/L   HCT 38.0 36.0 - 46.0 %   Hemoglobin 12.9 12.0 - 15.0 g/dL   Sample type VENOUS    Comment NOTIFIED PHYSICIAN   Troponin I (High Sensitivity)     Status: Abnormal   Collection Time: 07/19/23  6:05 AM  Result Value Ref Range   Troponin I (High Sensitivity) 32 (H) <18 ng/L    Comment: (NOTE) Elevated high sensitivity troponin I (hsTnI) values and significant  changes across serial measurements may suggest ACS but many other  chronic and acute conditions are known to elevate hsTnI results.  Refer to the "Links" section for chest pain algorithms and additional  guidance. Performed at [facility], [address], [place], [place] [address]   Procalcitonin     Status: None   Collection Time: 07/19/23  6:07 AM  Result Value Ref Range   Procalcitonin <0.10 ng/mL    Comment:        Interpretation: PCT (Procalcitonin) <= 0.5 ng/mL: Systemic infection (sepsis) is not likely. Local bacterial infection is possible. (NOTE)       Sepsis PCT Algorithm           Lower Respiratory Tract                                      Infection PCT Algorithm    ----------------------------     ----------------------------         PCT < 0.25 ng/mL                PCT < 0.10 ng/mL          Strongly encourage             Strongly discourage   discontinuation of antibiotics    initiation of antibiotics    ----------------------------      -----------------------------       PCT 0.25 - 0.50 ng/mL            PCT 0.10 - 0.25 ng/mL               OR       >80% decrease in PCT            Discourage initiation of                                            antibiotics      Encourage discontinuation           of antibiotics    ----------------------------     -----------------------------         PCT >= 0.50 ng/mL              PCT 0.26 - 0.50 ng/mL               AND        <80% decrease in PCT  Encourage initiation of                                             antibiotics       Encourage continuation           of antibiotics    ----------------------------     -----------------------------        PCT >= 0.50 ng/mL                  PCT > 0.50 ng/mL               AND         increase in PCT                  Strongly encourage                                      initiation of antibiotics    Strongly encourage escalation           of antibiotics                                     -----------------------------                                           PCT <= 0.25 ng/mL                                                 OR                                        > 80% decrease in PCT                                      Discontinue / Do not initiate                                             antibiotics  Performed at [facility], [address], [place], [place] [address]   I-Stat arterial blood gas, ED     Status: Abnormal   Collection Time: 07/19/23  8:03 AM  Result Value Ref Range   pH, Arterial 7.364 7.35 - 7.45   pCO2 arterial 85.9 (HH) 32 - 48 mmHg   pO2, Arterial 68 (L) 83 - 108 mmHg   Bicarbonate 49.2 (H) 20.0 - 28.0 mmol/L   TCO2 >50 (H) 22 - 32 mmol/L   O2 Saturation 92 %   Acid-Base Excess 19.0 (H) 0.0 - 2.0 mmol/L   Sodium 131 (L) 135 - 145 mmol/L   Potassium 3.4 (L) 3.5 - 5.1 mmol/L   Calcium, Ion 1.07 (  L) 1.15 - 1.40 mmol/L   HCT 36.0 36.0 - 46.0 %   Hemoglobin 12.2 12.0 - 15.0 g/dL    Patient temperature 97.6 F    Collection site RADIAL, ALLEN'S TEST ACCEPTABLE    Drawn by RT    Sample type ARTERIAL    Comment NOTIFIED PHYSICIAN   Resp panel by RT-PCR (RSV, Flu A&B, Covid) Anterior Nasal Swab     Status: None   Collection Time: 07/19/23 11:32 AM   Specimen: Anterior Nasal Swab  Result Value Ref Range   SARS Coronavirus 2 by RT PCR NEGATIVE NEGATIVE   Influenza A by PCR NEGATIVE NEGATIVE   Influenza B by PCR NEGATIVE NEGATIVE    Comment: (NOTE) The Xpert Xpress SARS-CoV-2/FLU/RSV plus assay is intended as an aid in the diagnosis of influenza from Nasopharyngeal swab specimens and should not be used as a sole basis for treatment. Nasal washings and aspirates are unacceptable for Xpert Xpress SARS-CoV-2/FLU/RSV testing.  Fact Sheet for Patients: BloggerCourse.com  Fact Sheet for Healthcare Providers: SeriousBroker.it  This test is not yet approved or cleared by the [place] FDA and has been authorized for detection and/or diagnosis of SARS-CoV-2 by FDA under an Emergency Use Authorization (EUA). This EUA will remain in effect (meaning this test can be used) for the duration of the COVID-19 declaration under Section 564(b)(1) of the Act, 21 U.S.C. section 360bbb-3(b)(1), unless the authorization is terminated or revoked.     Resp Syncytial Virus by PCR NEGATIVE NEGATIVE    Comment: (NOTE) Fact Sheet for Patients: BloggerCourse.com  Fact Sheet for Healthcare Providers: SeriousBroker.it  This test is not yet approved or cleared by the [place] FDA and has been authorized for detection and/or diagnosis of SARS-CoV-2 by FDA under an Emergency Use Authorization (EUA). This EUA will remain in effect (meaning this test can be used) for the duration of the COVID-19 declaration under Section 564(b)(1) of the Act, 21 U.S.C. section 360bbb-3(b)(1), unless the  authorization is terminated or revoked.  Performed at [facility], [address], [place], [place] [address]   Respiratory (~20 pathogens) panel by PCR     Status: None   Collection Time: 07/19/23 11:32 AM   Specimen: Anterior Nasal Swab; Respiratory  Result Value Ref Range   Adenovirus NOT DETECTED NOT DETECTED   Coronavirus 229E NOT DETECTED NOT DETECTED    Comment: (NOTE) The Coronavirus on the Respiratory Panel, DOES NOT test for the novel  Coronavirus (2019 nCoV)    Coronavirus HKU1 NOT DETECTED NOT DETECTED   Coronavirus NL63 NOT DETECTED NOT DETECTED   Coronavirus OC43 NOT DETECTED NOT DETECTED   Metapneumovirus NOT DETECTED NOT DETECTED   Rhinovirus / Enterovirus NOT DETECTED NOT DETECTED   Influenza A NOT DETECTED NOT DETECTED   Influenza B NOT DETECTED NOT DETECTED   Parainfluenza Virus 1 NOT DETECTED NOT DETECTED   Parainfluenza Virus 2 NOT DETECTED NOT DETECTED   Parainfluenza Virus 3 NOT DETECTED NOT DETECTED   Parainfluenza Virus 4 NOT DETECTED NOT DETECTED   Respiratory Syncytial Virus NOT DETECTED NOT DETECTED   Bordetella pertussis NOT DETECTED NOT DETECTED   Bordetella Parapertussis NOT DETECTED NOT DETECTED   Chlamydophila pneumoniae NOT DETECTED NOT DETECTED   Mycoplasma pneumoniae NOT DETECTED NOT DETECTED    Comment: Performed at [facility], [address], [place], [place] [address]  Urinalysis, Routine w reflex microscopic -Urine, Clean Catch     Status: None   Collection Time: 07/19/23 12:21 PM  Result Value Ref Range   Color, Urine YELLOW YELLOW   APPearance CLEAR CLEAR   Specific Gravity, Urine 1.009 1.005 - 1.030   pH 7.0 5.0 - 8.0   Glucose, UA NEGATIVE NEGATIVE mg/dL   Hgb urine dipstick NEGATIVE NEGATIVE   Bilirubin Urine NEGATIVE NEGATIVE   Ketones, ur NEGATIVE NEGATIVE mg/dL   Protein, ur NEGATIVE NEGATIVE mg/dL   Nitrite NEGATIVE NEGATIVE   Leukocytes,Ua NEGATIVE NEGATIVE    Comment: Performed at [facility],  [address] [place], [place] [address]  CBC     Status: Abnormal   Collection Time: 07/20/23  5:00 AM  Result Value Ref Range   WBC 10.9 (H) 4.0 - 10.5 K/uL   RBC 3.66 (L) 3.87 - 5.11 MIL/uL   Hemoglobin 10.7 (L) 12.0 - 15.0 g/dL   HCT 34.7 42.5 - 95.6 %   MCV 99.7 80.0 - 100.0 fL   MCH 29.2 26.0 - 34.0 pg   MCHC 29.3 (L) 30.0 - 36.0 g/dL   RDW 38.7 56.4 - 33.2 %   Platelets 208 150 - 400 K/uL   nRBC 0.0 0.0 - 0.2 %    Comment: Performed at [facility], [address] [place], [place] [address]  Basic metabolic panel     Status: Abnormal   Collection Time: 07/20/23  5:00 AM  Result Value Ref Range   Sodium 134 (L) 135 - 145 mmol/L   Potassium 3.9 3.5 - 5.1 mmol/L   Chloride 83 (L) 98 - 111 mmol/L   CO2 41 (H) 22 - 32 mmol/L   Glucose, Bld 132 (H) 70 - 99 mg/dL    Comment: Glucose reference range applies only to samples taken after fasting for at least 8 hours.   BUN 12 8 - 23 mg/dL   Creatinine, Ser 4.16 0.44 - 1.00 mg/dL   Calcium 8.4 (L) 8.9 - 10.3 mg/dL   GFR, Estimated >60 >63 mL/min    Comment: (NOTE) Calculated using the CKD-EPI Creatinine Equation (2021)    Anion gap 10 5 - 15    Comment: Performed at [facility], [address], [place], [place] [address]   ECHOCARDIOGRAM COMPLETE  Result Date: 07/19/2023    ECHOCARDIOGRAM REPORT   Patient Name:   [name] Date of Exam: 07/19/2023 Medical Rec #:  [id number]        Height:       63.0 in Accession #:    [id number]       Weight:       188.8 lb Date of Birth:  03-Jul-1951        BSA:          1.887 m Patient Age:    71 years         BP:           133/77 mmHg Patient Gender: F                HR:           100 bpm. Exam Location:  Inpatient Procedure: 2D Echo, Cardiac Doppler and Color Doppler Indications:    CHF  History:        Patient has prior history of Echocardiogram examinations, most                 recent 06/30/2022. CHF, COPD, Signs/Symptoms:Murmur; Risk                 Factors:Current Smoker.  Sonographer:    Darlys Gales Referring Phys: [id number] RONDELL A SMITH IMPRESSIONS  1. Left ventricular ejection fraction, by estimation, is 55 to 60%. The left ventricle has normal function. The left ventricle has no regional wall motion abnormalities. Left ventricular diastolic parameters are consistent with Grade II diastolic dysfunction (pseudonormalization).  2. Right ventricular systolic function was not well visualized. The right ventricular size is not well visualized.  3. Left atrial size was moderately dilated.  4. The mitral valve is grossly normal. Trivial mitral valve regurgitation. No evidence of mitral stenosis. Moderate mitral annular calcification.  5. The aortic valve is grossly normal. There is mild calcification of the aortic valve. There is mild thickening of the aortic valve. Aortic valve regurgitation is not visualized. Mild aortic valve stenosis.  6. The inferior vena cava is normal in size with greater than 50% respiratory variability, suggesting right atrial pressure of 3 mmHg. Comparison(s): No significant change from prior study. FINDINGS  Left Ventricle: Left ventricular ejection fraction, by estimation, is 55 to 60%. The left ventricle has normal function. The left ventricle has no regional wall motion abnormalities. The left ventricular internal cavity size was normal in size. There is  no left ventricular hypertrophy. Left ventricular diastolic parameters are consistent with Grade II diastolic dysfunction (pseudonormalization). Right Ventricle: The right ventricular size is not well visualized. Right vetricular wall thickness was not assessed. Right ventricular systolic function was not well visualized. Left Atrium: Left atrial size was moderately dilated. Right Atrium: Right atrial size was not well visualized. Pericardium: There is no evidence of pericardial effusion. Mitral Valve: The mitral valve is grossly normal. Moderate mitral annular calcification. Trivial mitral valve  regurgitation. No evidence of mitral valve stenosis. Tricuspid Valve: The tricuspid valve is not well visualized. Tricuspid valve regurgitation is trivial. No evidence of tricuspid stenosis. Aortic Valve: The aortic valve is grossly normal. There is mild calcification of the aortic valve. There is mild thickening of the aortic valve. Aortic valve regurgitation is not visualized. Mild aortic stenosis is present. Aortic valve mean gradient measures 13.0 mmHg. Aortic valve peak gradient measures 24.4 mmHg. Aortic valve area, by VTI measures 2.23 cm. Pulmonic Valve: The pulmonic valve was not well visualized. Pulmonic valve regurgitation is not visualized. Aorta: The aortic root was not well visualized and the ascending aorta was not well visualized. Venous: The inferior vena cava is normal in size with greater than 50% respiratory variability, suggesting right atrial pressure of 3 mmHg. IAS/Shunts: The interatrial septum was not well visualized.  LEFT VENTRICLE PLAX 2D LVIDd:         4.00 cm   Diastology LVIDs:         3.00 cm   LV e' medial:    5.55 cm/s LV PW:         1.00 cm   LV E/e' medial:  24.7 LV IVS:        1.00 cm   LV e' lateral:   6.85 cm/s LVOT diam:     1.80 cm   LV E/e' lateral: 20.0 LV SV:         105 LV SV Index:   56 LVOT Area:     2.54 cm  LEFT ATRIUM             Index LA Vol (A2C):   41.7 ml 22.10 ml/m LA Vol (A4C):   78.1 ml 41.39 ml/m LA Biplane Vol: 58.4 ml 30.95 ml/m  AORTIC VALVE AV Area (Vmax):    2.05  cm AV Area (Vmean):   2.03 cm AV Area (VTI):     2.23 cm AV Vmax:           247.00 cm/s AV Vmean:          168.000 cm/s AV VTI:            0.471 m AV Peak Grad:      24.4 mmHg AV Mean Grad:      13.0 mmHg LVOT Vmax:         199.00 cm/s LVOT Vmean:        134.000 cm/s LVOT VTI:          0.413 m LVOT/AV VTI ratio: 0.88 MITRAL VALVE MV Area (PHT): 4.65 cm     SHUNTS MV Decel Time: 163 msec     Systemic VTI:  0.41 m MV E velocity: 137.00 cm/s  Systemic Diam: 1.80 cm MV A velocity: 146.00  cm/s MV E/A ratio:  0.94 Jodelle Red MD Electronically signed by Jodelle Red MD Signature Date/Time: 07/19/2023/10:50:20 PM    Final    DG Chest Portable 1 View  Result Date: 07/19/2023 CLINICAL DATA:  72 year old female with history of respiratory distress and low oxygen saturations. Possible/pulmonary edema. EXAM: PORTABLE CHEST 1 VIEW COMPARISON:  Chest x-ray 07/11/2023. FINDINGS: Lung volumes are normal. Widespread interstitial prominence and patchy peribronchial cuffing noted throughout the lungs bilaterally, most severe in the mid to lower lungs, where there also new areas of architectural distortion in ill-defined opacities, which correspond to areas of apparent post infectious or inflammatory scarring noted on prior chest CT. Chronic pleural-parenchymal thickening in the right hemithorax with some calcified right-sided pleural plaques, similar to the prior study. No definite left pleural effusion. No pneumothorax. No definite cephalization of the pulmonary vasculature. Heart size is mildly enlarged. IMPRESSION: 1. Radiographic appearance the chest appears similar to the prior study, with exception of increasing interstitial prominence and diffuse peribronchial cuffing, suggesting a background of acute bronchitis. Electronically Signed   By: Trudie Reed M.D.   On: 07/19/2023 05:27    Pending Labs Unresulted Labs (From admission, onward)     Start     Ordered   07/21/23 0500  CBC with Differential/Platelet  Tomorrow morning,   R        07/20/23 0804   07/21/23 0500  Comprehensive metabolic panel  Tomorrow morning,   R        07/20/23 0804   07/21/23 0500  Magnesium  Tomorrow morning,   R        07/20/23 0804            Vitals/Pain Today's Vitals   07/20/23 1139 07/20/23 1200 07/20/23 1300 07/20/23 1400  BP:  126/62 (!) 140/51 (!) 122/52  Pulse:  77 (!) 102 82  Resp:  17    Temp: 97.9 F (36.6 C)     TempSrc: Oral     SpO2:  96% (!) 76% 94%  Weight:       Height:      PainSc:        Isolation Precautions No active isolations  Medications Medications  enoxaparin (LOVENOX) injection 40 mg (40 mg Subcutaneous Given 07/20/23 0840)  sodium chloride flush (NS) 0.9 % injection 3 mL (3 mLs Intravenous Given 07/20/23 0943)  acetaminophen (TYLENOL) tablet 650 mg (650 mg Oral Given 07/20/23 1448)    Or  acetaminophen (TYLENOL) suppository 650 mg ( Rectal See Alternative 07/20/23 1448)  albuterol (PROVENTIL) (2.5 MG/3ML) 0.083% nebulizer solution  2.5 mg (has no administration in time range)  ondansetron (ZOFRAN) tablet 4 mg (4 mg Oral Given 07/19/23 1329)    Or  ondansetron (ZOFRAN) injection 4 mg ( Intravenous See Alternative 07/19/23 1329)  ipratropium-albuterol (DUONEB) 0.5-2.5 (3) MG/3ML nebulizer solution 3 mL (3 mLs Nebulization Given 07/20/23 1154)  cefTRIAXone (ROCEPHIN) 2 g in sodium chloride 0.9 % 100 mL IVPB (0 g Intravenous Stopped 07/20/23 1154)  azithromycin (ZITHROMAX) tablet 500 mg (500 mg Oral Given 07/20/23 0838)  guaiFENesin (MUCINEX) 12 hr tablet 600 mg (600 mg Oral Given 07/20/23 0836)  predniSONE (DELTASONE) tablet 40 mg (has no administration in time range)  methylPREDNISolone sodium succinate (SOLU-MEDROL) 40 mg/mL injection 40 mg (40 mg Intravenous Given 07/20/23 0529)  diltiazem (CARDIZEM CD) 24 hr capsule 180 mg (180 mg Oral Given 07/20/23 0840)  atorvastatin (LIPITOR) tablet 80 mg (80 mg Oral Given 07/20/23 0837)  losartan (COZAAR) tablet 50 mg (50 mg Oral Given 07/20/23 0836)  escitalopram (LEXAPRO) tablet 5 mg (5 mg Oral Given 07/20/23 0838)  pantoprazole (PROTONIX) EC tablet 20 mg (20 mg Oral Given 07/20/23 0836)  hydrALAZINE (APRESOLINE) injection 10 mg (has no administration in time range)  HYDROcodone-acetaminophen (NORCO) 10-325 MG per tablet 1 tablet (1 tablet Oral Given 07/20/23 1334)  ALPRAZolam (XANAX) tablet 0.25 mg (0.25 mg Oral Given 07/20/23 1138)  budesonide (PULMICORT) nebulizer solution 0.5 mg (0.5 mg Nebulization  Given 07/20/23 0839)  acetaZOLAMIDE (DIAMOX) injection 250 mg (250 mg Intravenous Not Given 07/20/23 1454)  furosemide (LASIX) tablet 20 mg (20 mg Oral Given 07/20/23 0837)  potassium chloride SA (KLOR-CON M) CR tablet 20 mEq (20 mEq Oral Given 07/20/23 0839)  sterile water (preservative free) injection (has no administration in time range)  fentaNYL (SUBLIMAZE) injection 50 mcg (50 mcg Intravenous Given 07/19/23 0453)  albuterol (PROVENTIL) (2.5 MG/3ML) 0.083% nebulizer solution (10 mg/hr Nebulization Given 07/19/23 0447)  ipratropium (ATROVENT) nebulizer solution 0.5 mg (0.5 mg Nebulization Given 07/19/23 0447)  magnesium sulfate IVPB 2 g 50 mL (0 g Intravenous Stopped 07/19/23 0555)  furosemide (LASIX) injection 40 mg (40 mg Intravenous Given 07/19/23 1610)  sterile water (preservative free) injection (2.5 mLs  Given 07/19/23 2036)  acetaZOLAMIDE (DIAMOX) injection 250 mg (250 mg Intravenous Given 07/20/23 1449)    Mobility walks     Focused Assessments Cardiac Assessment Handoff:  Cardiac Rhythm: Normal sinus rhythm No results found for: "CKTOTAL", "CKMB", "CKMBINDEX", "TROPONINI" No results found for: "DDIMER" Does the Patient currently have chest pain? No   , Pulmonary Assessment Handoff:  Lung sounds: Bilateral Breath Sounds: Diminished L Breath Sounds: Diminished R Breath Sounds: Diminished O2 Device: Nasal Cannula O2 Flow Rate (L/min): 3 L/min    R Recommendations: See Admitting Provider Note  Report given to:   Additional Notes:

## 2023-07-20 NOTE — Telephone Encounter (Signed)
Error

## 2023-07-20 NOTE — Progress Notes (Signed)
Physical Therapy Evaluation Patient Details Name: [name] MRN: [id number] DOB: Feb 22, 1951 Today's Date: 07/20/2023  History of Present Illness  72 yo female with SOB and weakness, fever, wheezing and low O2 sats was admitted 7/24.  Had recent admission for UTI.  Dx with acute on chronic hypercarbic and hypoxic resp failure, in setting of COPD and acute CHF with HCAP, acute bronchitis.  Has reduced her O2 need, referred for PT.  PMHx: COPD, CHF, bronchiectasis, pulm nodules, nicotine addiction, heart murmur, pyelonephritis, e coli, bacteremia, HTN,  Clinical Impression  Pt was seen for progression of mobility from bed to sit and was hindered to further move by sats dropping to 85% and BP drop to 95/58.  Pt is not symptomatic and very much not interested in going to rehab setting, insisting her family can care for her.  However, pt is on 4L O2 for managing sats and even at rest in bed is having drops below 88%.  Pt is recommended to get inpt care <3 hours a day, and hopefully will agree given her readmission from a few days ago.  Follow acutely for goals of PT.        Assistance Recommended at Discharge Intermittent Supervision/Assistance  If plan is discharge home, recommend the following:  Can travel by private vehicle  A little help with walking and/or transfers;A little help with bathing/dressing/bathroom;Assistance with cooking/housework;Help with stairs or ramp for entrance;Assist for transportation   No    Equipment Recommendations None recommended by PT  Recommendations for Other Services       Functional Status Assessment Patient has had a recent decline in their functional status and demonstrates the ability to make significant improvements in function in a reasonable and predictable amount of time.     Precautions / Restrictions Precautions Precautions: Fall Precaution Comments: requires titration of O2, falls recently Restrictions Weight Bearing Restrictions: No       Mobility  Bed Mobility Overal bed mobility: Needs Assistance Bed Mobility: Supine to Sit, Sit to Supine     Supine to sit: Min guard Sit to supine: Min guard   General bed mobility comments: min guard as pt is on ED gurney    Transfers                   General transfer comment: deferred due to her vitals    Ambulation/Gait               General Gait Details: deferred medically  Stairs            Wheelchair Mobility     Tilt Bed    Modified Rankin (Stroke Patients Only)       Balance Overall balance assessment: History of Falls                                           Pertinent Vitals/Pain Pain Assessment Pain Assessment: Faces Faces Pain Scale: No hurt    Home Living Family/patient expects to be discharged to:: Private residence Living Arrangements: Children Available Help at Discharge: Family;Available 24 hours/day Type of Home: House Home Access: Stairs to enter Entrance Stairs-Rails: Right;Left;Can reach both Entrance Stairs-Number of Steps: 6   Home Layout: One level Home Equipment: Shower seat;Grab bars - toilet;Grab bars - tub/shower;Rolling Walker (2 wheels);Rollator (4 wheels);Cane - single point Additional Comments: states she crawls up steps to enter house when  needed, wants to go home    Prior Function Prior Level of Function : Independent/Modified Independent             Mobility Comments: walked with no AD or with rollator ADLs Comments: independent     Hand Dominance   Dominant Hand: Right    Extremity/Trunk Assessment   Upper Extremity Assessment Upper Extremity Assessment: Generalized weakness    Lower Extremity Assessment Lower Extremity Assessment: Generalized weakness    Cervical / Trunk Assessment Cervical / Trunk Assessment: Normal  Communication   Communication: No difficulties  Cognition Arousal/Alertness: Awake/alert Behavior During Therapy: Flat  affect Overall Cognitive Status: No family/caregiver present to determine baseline cognitive functioning                                 General Comments: concern about her judgment regarding help needed for home        General Comments General comments (skin integrity, edema, etc.): Pt was seen for mobility on side of bed, noted her sats and BP were too compromised for standing activity and gait trials.  Pt is proposing that she try again if sats look good in the moment but fluctuating to 85% and up to 91%.    Exercises     Assessment/Plan    PT Assessment Patient needs continued PT services  PT Problem List Cardiopulmonary status limiting activity;Decreased strength;Decreased activity tolerance;Decreased balance;Decreased mobility;Decreased knowledge of precautions       PT Treatment Interventions DME instruction;Gait training;Functional mobility training;Therapeutic activities;Patient/family education;Balance training;Therapeutic exercise    PT Goals (Current goals can be found in the Care Plan section)  Acute Rehab PT Goals Patient Stated Goal: to go home from hospital PT Goal Formulation: With patient Time For Goal Achievement: 08/03/23 Potential to Achieve Goals: Good    Frequency Min 1X/week     Co-evaluation               AM-PAC PT "6 Clicks" Mobility  Outcome Measure Help needed turning from your back to your side while in a flat bed without using bedrails?: None Help needed moving from lying on your back to sitting on the side of a flat bed without using bedrails?: A Little Help needed moving to and from a bed to a chair (including a wheelchair)?: A Little Help needed standing up from a chair using your arms (e.g., wheelchair or bedside chair)?: Total Help needed to walk in hospital room?: Total Help needed climbing 3-5 steps with a railing? : Total 6 Click Score: 13    End of Session   Activity Tolerance: Patient limited by  fatigue;Treatment limited secondary to medical complications (Comment) Patient left: in bed;with call bell/phone within reach Nurse Communication: Mobility status PT Visit Diagnosis: Unsteadiness on feet (R26.81);Muscle weakness (generalized) (M62.81)    Time: 1324-4010 PT Time Calculation (min) (ACUTE ONLY): 23 min   Charges:   PT Evaluation $PT Eval Moderate Complexity: 1 Mod PT Treatments $Therapeutic Activity: 8-22 mins PT General Charges $$ ACUTE PT VISIT: 1 Visit        Ivar Drape 07/20/2023, 2:11 PM  Samul Dada, PT PhD Acute Rehab Dept. Number: [facility] [id number] and [facility] [phone]

## 2023-07-20 NOTE — Progress Notes (Signed)
NAME:  RAVINDER HOFLAND, MRN:  [id number], DOB:  11-Apr-1951, LOS: 1 ADMISSION DATE:  07/19/2023, CONSULTATION DATE:  07/19/23 REFERRING MD:  Katrinka [facility]), CHIEF COMPLAINT:  acute on chronic respiratory failure    History of Present Illness:  72yo female smoker with hx chronic respiratory failure on 3-4L home O2 in setting COPD followed by Dr. Sherene Sires as outpt, CHF, HLD, HTN with recent admission 7/17-7/19 after a fall at home with head trauma and metabolic encephalopathy r/t [facility] UTI. SHe was d/c home on nitrofurantoin. She returned to ER 7/24 with worsening SOB, weakness, subjective fever, wheezing. On EMS arrival she was noted to have O2 sats 67%.  Now in ER, back to baseline 3L [place].  She has improved with NRB, duoneb, Lasix 40mg  x1 and solumedrol. She denies chest pain, hemoptysis, purulent sputum, known sick contacts, abd pain, n/v/d.    Pertinent  Medical History   has a past medical history of Anxiety, Atrophic vaginitis (10/09/2015), Baker's cyst, Blood in urine (10/09/2015), CHF (congestive heart failure) (HCC), COPD (chronic obstructive pulmonary disease) (HCC), Heart murmur, Hematuria (10/09/2015), Hypertension, Nicotine addiction (04/07/2014), Oxygen dependent, Pneumonia, Urinary retention (07/01/2020), Urinary urgency (01/25/2016), and Vaginal bleeding (10/09/2015).   Significant Hospital Events: Including procedures, antibiotic start and stop dates in addition to other pertinent events     Interim History / Subjective:  Hungry, does not want the bag lunch.  Afebrile, WBC 13, ABG with pH 7.36, PCO2 86 Back to baseline 3L [employer]. Still feels a bit more SOB than normal but much improved from this morning.   Objective   Blood pressure 136/61, pulse 81, temperature 98.4 F (36.9 C), temperature source Oral, resp. rate 15, height 5\' 3"  (1.6 m), weight 85.6 kg, SpO2 (!) 89%.       No intake or output data in the 24 hours ending 07/20/23 1043  Filed Weights   07/19/23 0344  Weight: 85.6 kg     Examination: Chronically-year-old female who is not happy with her current admission  Resolved Hospital Problem list     Assessment & Plan:  Acute on chronic hypercarbic and hypoxic respiratory failure - multifactorial in setting AECOPD, acute on chronic dCHF, ?HCAP with some focal consolidation on recent CT scan +/- acute bronchitis  COPD  Acute on chronic diastolic CHF PLAN -  Currently on 3 L nasal cannula with sats of 94% Continue Rocephin and azithromycin She is negative for viral syndrome Currently on Solu-Medrol 40 mg every 12 hours to be decreased to prednisone 40 mg on 07/21/2023 Monitor fluid status although they are not doing I's and O's ordered in the emergency room Bronchodilators Pulmonary hygiene     Best Practice (right click and "Reselect all SmartList Selections" daily)   Diet/type: Regular consistency (see orders) DVT prophylaxis: LMWH GI prophylaxis: N/A Lines: N/A Foley:  N/A Code Status:  full code Last date of multidisciplinary goals of care discussion [tbd]  Labs   CBC: Recent Labs  Lab 07/14/23 0043 07/19/23 0358 07/19/23 0405 07/19/23 0803 07/20/23 0500  WBC 9.0 13.0*  --   --  10.9*  NEUTROABS 6.2 10.3*  --   --   --   HGB 10.6* 11.0* 12.9 12.2 10.7*  HCT 38.0 38.4 38.0 36.0 36.5  MCV 103.5* 102.4*  --   --  99.7  PLT 202 193  --   --  208    Basic Metabolic Panel: Recent Labs  Lab 07/14/23 0043 07/19/23 0358 07/19/23 0405 07/19/23 0803 07/20/23 0500  NA  138 135 129* 131* 134*  K 4.2 4.1 5.9* 3.4* 3.9  CL 90* 81*  --   --  83*  CO2 37* 39*  --   --  41*  GLUCOSE 97 152*  --   --  132*  BUN 16 10  --   --  12  CREATININE 0.60 0.57  --   --  0.64  CALCIUM 8.6* 8.7*  --   --  8.4*  MG 2.3  --   --   --   --   PHOS 4.7*  --   --   --   --    GFR: Estimated Creatinine Clearance: 68.8 mL/min (by C-G formula based on SCr of 0.64 mg/dL). Recent Labs  Lab 07/14/23 0043 07/19/23 0358 07/19/23 0607 07/20/23 0500   PROCALCITON  --   --  <0.10  --   WBC 9.0 13.0*  --  10.9*    Liver Function Tests: Recent Labs  Lab 07/19/23 0358  AST 18  ALT 21  ALKPHOS 68  BILITOT 0.6  PROT 6.7  ALBUMIN 3.6   No results for input(s): "LIPASE", "AMYLASE" in the last 168 hours. No results for input(s): "AMMONIA" in the last 168 hours.  ABG    Component Value Date/Time   PHART 7.364 07/19/2023 0803   PCO2ART 85.9 (HH) 07/19/2023 0803   PO2ART 68 (L) 07/19/2023 0803   HCO3 49.2 (H) 07/19/2023 0803   TCO2 >50 (H) 07/19/2023 0803   O2SAT 92 07/19/2023 0803     Coagulation Profile: No results for input(s): "INR", "PROTIME" in the last 168 hours.  Cardiac Enzymes: No results for input(s): "CKTOTAL", "CKMB", "CKMBINDEX", "TROPONINI" in the last 168 hours.  HbA1C: Hgb A1c MFr Bld  Date/Time Value Ref Range Status  07/01/2022 02:07 AM 5.4 4.8 - 5.6 % Final    Comment:    (NOTE) Pre diabetes:          5.7%-6.4%  Diabetes:              >6.4%  Glycemic control for   <7.0% adults with diabetes     CBG: No results for input(s): "GLUCAP" in the last 168 hours.  Brett Canales Minor ACNP Acute Care Nurse Practitioner [facility] Please consult Amion 07/20/2023, 10:44 AM    Attending Note:  I have examined patient, reviewed labs, studies and notes.   72 year old woman with chronic hypoxemic respiratory failure in the setting of COPD, bronchiectasis, diastolic CHF.  Now with apparent bibasilar CAP on recent CT chest.  Presented with wheezing and is being treated for acute exacerbation of her COPD.  Wheezing has improved.  Agree with transition of her corticosteroids over to a prednisone taper.  Continue ceftriaxone and azithromycin, likely able to transition over to a p.o. regimen soon.  Would use either doxycycline or levofloxacin to ensure we cover atypicals.  Goal I=O and good blood pressure control continue her current bronchodilators, pulmonary hygiene.  She needs have improved.   She has baseline significant hypercapnia with compensated pH.  I gave her 2 doses of Diamox to see if we can drive her pCO2 down some.  Given her overall stability I do not think we need to repeat an ABG.  Outpatient follow-up will be with Dr. Sherene Sires  Please call if we can assist further.  Levy Pupa, MD, PhD 07/20/2023, 12:15 PM [facility] [phone] or if no answer [phone]

## 2023-07-21 DIAGNOSIS — J9601 Acute respiratory failure with hypoxia: Secondary | ICD-10-CM | POA: Diagnosis not present

## 2023-07-21 DIAGNOSIS — J9602 Acute respiratory failure with hypercapnia: Secondary | ICD-10-CM | POA: Diagnosis not present

## 2023-07-21 LAB — COMPREHENSIVE METABOLIC PANEL: Anion gap: 8 (ref 5–15)

## 2023-07-21 LAB — CBC WITH DIFFERENTIAL/PLATELET: WBC: 11.6 10*3/uL — ABNORMAL HIGH (ref 4.0–10.5)

## 2023-07-21 MED ORDER — IPRATROPIUM-ALBUTEROL 0.5-2.5 (3) MG/3ML IN SOLN
3.0000 mL | Freq: Three times a day (TID) | RESPIRATORY_TRACT | Status: DC
  Administered 2023-07-21 – 2023-07-23 (×6): 3 mL via RESPIRATORY_TRACT
  Filled 2023-07-21 (×6): qty 3

## 2023-07-21 MED ORDER — METHYLPREDNISOLONE SODIUM SUCC 40 MG IJ SOLR
40.0000 mg | Freq: Two times a day (BID) | INTRAMUSCULAR | Status: DC
  Administered 2023-07-21 – 2023-07-23 (×4): 40 mg via INTRAVENOUS
  Filled 2023-07-21 (×4): qty 1

## 2023-07-21 NOTE — Progress Notes (Signed)
SATURATION QUALIFICATIONS: (This note is used to comply with regulatory documentation for home oxygen)   Pt's baseline O2 at home is 3L.  Patient Saturations on 3 Liters of oxygen while Ambulating = 76%  Bumped oxygen to 5L while ambulating and O2 remained at 88-90%  Pt recovered within approx 90 seconds back to 3L O2 sat 94%  Please briefly explain why patient needs home oxygen: Pt already used O2 at home, but now is requiring more with activity.

## 2023-07-21 NOTE — TOC Initial Note (Signed)
Transition of Care [facility]) - Initial/Assessment Note    Patient Details  Name: [name] MRN: [id number] Date of Birth: [birth year]  Transition of Care [facility]) CM/SW Contact:    Leander Rams, LCSW Phone Number: 07/21/2023, 10:38 AM  Clinical Narrative:                 CSW met with pt at bedside to discuss the recommendation for SNF. Pt respectfully declined and mentioned she has done rehab previously and did not want to do it again. Pt reports she is from home with her daughter and has had [facility] services in the past. Pt would like to return home and resume services with [facility]. CSW made CM aware.   TOC will continue to follow.   Expected Discharge Plan: Home w Home Health Services Barriers to Discharge: Continued Medical Work up   Patient Goals and CMS Choice Patient states their goals for this hospitalization and ongoing recovery are:: Pt would like to return home and resume HH services with [facility].          Expected Discharge Plan and Services       Living arrangements for the past 2 months: Single Family Home                                      Prior Living Arrangements/Services Living arrangements for the past 2 months: Single Family Home Lives with:: Adult Children Patient language and need for interpreter reviewed:: Yes Do you feel safe going back to the place where you live?: Yes      Need for Family Participation in Patient Care: Yes (Comment) Care giver support system in place?: Yes (comment) Current home services: DME Criminal Activity/Legal Involvement Pertinent to Current Situation/Hospitalization: No - Comment as needed  Activities of Daily Living      Permission Sought/Granted                  Emotional Assessment Appearance:: Appears stated age, Developmentally appropriate Attitude/Demeanor/Rapport: Gracious, Engaged Affect (typically observed): Calm, Pleasant Orientation: : Oriented to Self, Oriented to Place,  Oriented to  Time, Oriented to Situation Alcohol / Substance Use: Not Applicable Psych Involvement: No (comment)  Admission diagnosis:  CHF (congestive heart failure) (HCC) [I50.9] COPD exacerbation (HCC) [J44.1] COPD with acute exacerbation (HCC) [J44.1] Acute respiratory failure with hypoxia and hypercapnia (HCC) [J96.01, J96.02] Acute congestive heart failure, unspecified heart failure type [facility]) [I50.9] Patient Active Problem List   Diagnosis Date Noted   Chronic diastolic CHF (congestive heart failure) (HCC) 07/19/2023   Acute respiratory failure with hypoxia and hypercapnia (HCC) 07/19/2023   Leukocytosis 07/19/2023   History of UTI 07/19/2023   COPD GOLD 4  07/27/2022   Chronic lower back pain 07/06/2022   Pulmonary nodule 07/06/2022   Obesity (BMI 30-39.9) 07/06/2022   Normocytic anemia 07/06/2022   Severe sepsis (HCC) 06/30/2022   Acute pyelonephritis 06/30/2022   E coli bacteremia 06/30/2022   Acute metabolic encephalopathy 06/30/2022   Acute on chronic respiratory failure with hypoxia and hypercapnia (HCC) 06/30/2022   Urinary tract infection 06/30/2022   Elevated troponin    Hypercholesterolemia 03/24/2022   COPD with acute exacerbation (HCC) 06/15/2021   Essential hypertension    Anxiety    Acute on chronic diastolic CHF (congestive heart failure) (HCC)    Therapeutic opioid-induced constipation (OIC) 05/27/2021   Urinary retention 07/01/2020   Hematochezia 11/09/2018  Presence of left artificial elbow joint 06/29/2017   Closed displaced fracture of head of radius with routine healing 06/29/2017   Urinary urgency 01/25/2016   Blood in urine 10/09/2015   Vaginal bleeding 10/09/2015   Atrophic vaginitis 10/09/2015   Hematuria 10/09/2015   GERD (gastroesophageal reflux disease) 09/04/2014   Macrocytic anemia 09/04/2014   Constipation 07/14/2014   Nicotine addiction 04/07/2014   PCP:  Benita Stabile, MD Pharmacy:   [facility] - [place], [place]  - [address] [place] [address] Phone: [phone] Fax: [phone]  [facility] - [facility] [address] [place] [address] Phone: [phone] Fax: [phone]     Social Determinants of Health (SDOH) Social History: SDOH Screenings   Food Insecurity: No Food Insecurity (07/12/2023)  Housing: Low Risk  (07/12/2023)  Transportation Needs: No Transportation Needs (07/12/2023)  Utilities: Not At Risk (07/12/2023)  Tobacco Use: Medium Risk (07/19/2023)   SDOH Interventions:     Readmission Risk Interventions    07/12/2023   12:24 PM  Readmission Risk Prevention Plan  Transportation Screening Complete  PCP or Specialist Appt within 5-7 Days Complete  Home Care Screening Complete  Medication Review (RN CM) Complete   Oletta Lamas, MSW, LCSWA, LCASA Transitions of Care  Clinical Social Worker I

## 2023-07-21 NOTE — TOC Progression Note (Addendum)
Transition of Care [facility]) - Progression Note    Patient Details  Name: [name] MRN: [id number] Date of Birth: 15-Feb-1951  Transition of Care [facility]) CM/SW Contact  Leone Haven, RN Phone Number: 07/21/2023, 11:56 AM  Clinical Narrative:    Patient informed CSW she would like [facility], since she had them before,  NCM made referral to [facility] with [facility]. She states she is able take referral for HHRN,HHPT, HHOT and Social Work. Soc will begin 24 to 48 hrs post dc.  Will need HH orders. She lives with her daughter, [name], her PCP is [facility], she has insurance on file, she is set up with [facility] for [facility] services, she has shower chair, hand rails , rollator and home oxygen with adapt 3 liters, and with her portable oxygen 4 liters. She states she needs a BSC.  She states her daughter or her Sister will transport her home, her family is her support system, she gets her medications from [place] and [place] on [address], pta she ambulated with rollator sometimes. NCM made referral to [facility] with [employer] for [facility], he will deliver to room prior to dc.    Expected Discharge Plan: Home w Home Health Services Barriers to Discharge: Continued Medical Work up  Expected Discharge Plan and Services       Living arrangements for the past 2 months: Single Family Home                                       Social Determinants of Health (SDOH) Interventions SDOH Screenings   Food Insecurity: No Food Insecurity (07/12/2023)  Housing: Low Risk  (07/12/2023)  Transportation Needs: No Transportation Needs (07/12/2023)  Utilities: Not At Risk (07/12/2023)  Tobacco Use: Medium Risk (07/19/2023)    Readmission Risk Interventions    07/12/2023   12:24 PM  Readmission Risk Prevention Plan  Transportation Screening Complete  PCP or Specialist Appt within 5-7 Days Complete  Home Care Screening Complete  Medication Review (RN CM) Complete

## 2023-07-21 NOTE — Evaluation (Signed)
Occupational Therapy Evaluation Patient Details Name: [name] MRN: [id number] DOB: [birth year] Today's Date: 07/21/2023   History of Present Illness 72 yo female with SOB and weakness, fever, wheezing and low O2 sats was admitted 7/24.  Had recent admission for UTI.  Dx with acute on chronic hypercarbic and hypoxic resp failure, in setting of COPD and acute CHF with HCAP, acute bronchitis. PMHx: COPD, CHF, bronchiectasis, pulm nodules, nicotine addiction, heart murmur, pyelonephritis, e coli, bacteremia, HTN,   Clinical Impression   Pt uses a rollator vs no device and is independent in self care. She lives with her daughter. Pt moves quickly, but only requires supervision for safety and lines for OOB to [facility] and chair. She needs set up to supervision for ADLs. Pt appears to have poor medical literacy with many questions about her medications, RN made aware. Will follow acutely. Do not anticipate post acute OT needs.      Recommendations for follow up therapy are one component of a multi-disciplinary discharge planning process, led by the attending physician.  Recommendations may be updated based on patient status, additional functional criteria and insurance authorization.   Assistance Recommended at Discharge PRN  Patient can return home with the following Assistance with cooking/housework;Assist for transportation;Help with stairs or ramp for entrance    Functional Status Assessment  Patient has had a recent decline in their functional status and demonstrates the ability to make significant improvements in function in a reasonable and predictable amount of time.  Equipment Recommendations  BSC/3in1 (pt requests)    Recommendations for Other Services       Precautions / Restrictions Precautions Precautions: Fall Restrictions Weight Bearing Restrictions: No      Mobility Bed Mobility Overal bed mobility: Modified Independent                  Transfers Overall  transfer level: Needs assistance Equipment used: None Transfers: Sit to/from Stand, Bed to chair/wheelchair/BSC Sit to Stand: Supervision           General transfer comment: supervision x 2 for safety/lines      Balance Overall balance assessment: History of Falls                                         ADL either performed or assessed with clinical judgement   ADL Overall ADL's : Needs assistance/impaired Eating/Feeding: Independent;Sitting   Grooming: Set up;Sitting   Upper Body Bathing: Set up;Sitting   Lower Body Bathing: Supervison/ safety;Sit to/from stand   Upper Body Dressing : Set up;Sitting   Lower Body Dressing: Supervision/safety;Sit to/from stand   Toilet Transfer: Supervision/safety;Stand-pivot;BSC/3in1   Psychiatrist and Hygiene: Set up;Sitting/lateral lean               Vision Baseline Vision/History: 1 Wears glasses Ability to See in Adequate Light: 0 Adequate Patient Visual Report: No change from baseline Additional Comments: readers     Perception     Praxis      Pertinent Vitals/Pain Pain Assessment Pain Assessment: No/denies pain     Hand Dominance Right   Extremity/Trunk Assessment Upper Extremity Assessment Upper Extremity Assessment: Overall WFL for tasks assessed   Lower Extremity Assessment Lower Extremity Assessment: Defer to PT evaluation   Cervical / Trunk Assessment Cervical / Trunk Assessment: Normal   Communication Communication Communication: No difficulties   Cognition Arousal/Alertness: Awake/alert Behavior During Therapy: Impulsive  Overall Cognitive Status: No family/caregiver present to determine baseline cognitive functioning                                 General Comments: pt thinks gabapentin contributed to her fall, she thinks it is an antibiotic, RN notified     General Comments       Exercises     Shoulder Instructions      Home Living  Family/patient expects to be discharged to:: Private residence Living Arrangements: Children Available Help at Discharge: Family;Available 24 hours/day Type of Home: House Home Access: Stairs to enter [employer] of Steps: 6 Entrance Stairs-Rails: Right;Left;Can reach both Home Layout: One level     Bathroom Shower/Tub: Chief Strategy Officer: Handicapped height     Home Equipment: Shower seat;Grab bars - toilet;Grab bars - tub/shower;Rolling Walker (2 wheels);Rollator (4 wheels);Cane - single point          Prior Functioning/Environment Prior Level of Function : Needs assist             Mobility Comments: walked with no AD or with rollator ADLs Comments: independent        OT Problem List: Decreased activity tolerance      OT Treatment/Interventions: Self-care/ADL training;Therapeutic exercise;Therapeutic activities;Energy conservation;DME and/or AE instruction;Patient/family education;Balance training;Manual therapy;Modalities    OT Goals(Current goals can be found in the care plan section) Acute Rehab OT Goals OT Goal Formulation: With patient Time For Goal Achievement: 08/04/23 Potential to Achieve Goals: Good  OT Frequency: Min 1X/week    Co-evaluation              AM-PAC OT "6 Clicks" Daily Activity     Outcome Measure Help from another person eating meals?: None Help from another person taking care of personal grooming?: A Little Help from another person toileting, which includes using toliet, bedpan, or urinal?: A Little Help from another person bathing (including washing, rinsing, drying)?: A Little Help from another person to put on and taking off regular upper body clothing?: A Little Help from another person to put on and taking off regular lower body clothing?: A Little 6 Click Score: 19   End of Session Equipment Utilized During Treatment: Oxygen;Gait belt (3L) Nurse Communication: Mobility status  Activity  Tolerance: Patient tolerated treatment well Patient left: in chair;with call bell/phone within reach;with chair alarm set  OT Visit Diagnosis: History of falling (Z91.81);Muscle weakness (generalized) (M62.81);Other (comment) (decreased activity tolerance)                Time: 1610-9604 OT Time Calculation (min): 26 min Charges:  OT General Charges $OT Visit: 1 Visit OT Evaluation $OT Eval Low Complexity: 1 Low OT Treatments $Self Care/Home Management : 8-22 mins  Berna Spare, OTR/L Acute Rehabilitation Services Office: [phone]   Evern Bio 07/21/2023, 10:19 AM

## 2023-07-21 NOTE — Plan of Care (Signed)
  Problem: Nutrition: Goal: Adequate nutrition will be maintained Outcome: Completed/Met

## 2023-07-21 NOTE — Progress Notes (Signed)
[facility] PROGRESS NOTE  [name] (DOB: February 15, 1951) [id number] PCP: Benita Stabile, MD  Brief Narrative: [name] is a 72 y.o. female with a history of COPD on chronic supplemental oxygen, HTN, HLD, OSA intolerant of CPAP, and recent admission for fall at home with E. coli UTI (discharged reluctantly per noted on 7/19) who presented to the ED on 07/19/2023 with shortness of breath, admitted for acute on chronic respiratory failure, AECOPD, rule out pneumonia.   Subjective: Says she was in the ED for 2 days and they didn't give her any medications at all. Says that they gave her gabapentin for the UTI, despite my assertion that gabapentin is not an antibiotic. Her breathing is better but not very good, short of breath with any exertion, including just to [facility].   Objective: BP (!) 151/66 (BP Location: Left Arm)   Pulse 85   Temp 98.7 F (37.1 C) (Oral)   Resp 20   Ht 5\' 3"  (1.6 m)   Wt 83.1 kg   SpO2 98%   BMI 32.43 kg/m   Gen: No distress, sitting in c, still end-expiratory wheezing. CV: RRR, no tachycardia, no MRG or pitting edema GI: Soft, NT, ND, +BS Neuro: Alert and oriented, appears anxious. No new focal deficits. Ext: Warm, no deformities Skin: Ecchymosis about right eye.   Assessment & Plan: Principal Problem:   Acute respiratory failure with hypoxia and hypercapnia (HCC) Active Problems:   COPD with acute exacerbation (HCC)   Chronic diastolic CHF (congestive heart failure) (HCC)   Leukocytosis   Elevated troponin   Essential hypertension   History of UTI   Anxiety  Acute on chronic hypoxic and hypercarbic respiratory failure: Multifactorial, including AECOPD, acute bronchitis on untreated OSA: - Ambulatory pulse oximetry requested. Home 3L O2 dependence, 4L with exertion is baseline. Patient ultimately did comply and had desaturations with significant dyspnea.  - Return to IV steroids - Continue ceftriaxone and azithromycin for empiric Tx x5  days. PCT was negative. Viral panels negative.  - Continue scheduled bronchodilators, and prn albuterol.  - IS, encourage to get up and OOB. - Continue mucinex   Chronic HFpEF, HTN: - Echocardiogram reassuring, doubt acute on chronic HFpEF, so will continue lasix 20mg  daily.  - Continue losartan, diltiazem  Anxiety:  - Continue lexapro and xanax.   Fall at home, debility:  - Encourage OOB with assistance. PT and OT assessing.  - We will maximize home health services and DME.  HLD:  - Continue statin  Recent UTI: Treated with keflex then macrobid, both susceptible per urine culture on 7/16.  [name], MD [facility] [email or web address] 07/21/2023, 3:50 PM

## 2023-07-22 DIAGNOSIS — J9602 Acute respiratory failure with hypercapnia: Secondary | ICD-10-CM | POA: Diagnosis not present

## 2023-07-22 DIAGNOSIS — J9601 Acute respiratory failure with hypoxia: Secondary | ICD-10-CM | POA: Diagnosis not present

## 2023-07-22 NOTE — Plan of Care (Signed)
[name]

## 2023-07-22 NOTE — Progress Notes (Signed)
[facility] PROGRESS NOTE  [name] (DOB: 1951/05/16) [id number] PCP: Benita Stabile, MD  Brief Narrative: [name] is a 72 y.o. female with a history of COPD on chronic supplemental oxygen, HTN, HLD, OSA intolerant of CPAP, and recent admission for fall at home with E. coli UTI (discharged reluctantly per noted on 7/19) who presented to the ED on 07/19/2023 with shortness of breath, admitted for acute on chronic respiratory failure, AECOPD, rule out pneumonia. She feels better, though remains hypoxic beyond her baseline.  Subjective: Wants to thank me for helping her feel better. Breathing improved, so she wants to get up walking twice today. Thinks she'll be ready to leave tomorrow at lunch time. She denied chest pain or unilateral leg swelling. No vision changes.   Objective: BP (!) 163/77 (BP Location: Right Arm)   Pulse 87   Temp 97.9 F (36.6 C) (Oral)   Resp 19   Ht 5\' 3"  (1.6 m)   Wt 83.1 kg   SpO2 92%   BMI 32.43 kg/m   Gen: 71yo F in no distress sitting on BSC, pt asked me to come in anyway. HEENT: Right lateral strabismus is stable and chronic per pt (omitted from exam yesterday).  Pulm: Crackles at right base, no wheezes, better air movement bilaterally. Not labored with supplemental oxygen at rest.   CV: RRR, no MRG, no JVD GI: Soft, NT, ND, +BS Neuro: Alert and oriented. No new focal deficits. Ext: Warm, no deformities. Skin: Right periorbital ecchymosis is stable without new rashes, lesions or ulcers on visualized skin   Assessment & Plan: Principal Problem:   Acute respiratory failure with hypoxia and hypercapnia (HCC) Active Problems:   COPD with acute exacerbation (HCC)   Chronic diastolic CHF (congestive heart failure) (HCC)   Leukocytosis   Elevated troponin   Essential hypertension   History of UTI   Anxiety  Acute on chronic hypoxic and hypercarbic respiratory failure: Multifactorial, including AECOPD, acute bronchitis on  untreated OSA: - Ambulatory pulse oximetry to be repeated. She continues subjective improvement but desaturated with severe symptoms 7/26.  - Continue IV steroids for another day, transition to prednisone once back near baseline. - Continue ceftriaxone and azithromycin for empiric Tx x5 days. PCT was negative, but has focal crackles on exam. Viral panels negative.  - Continue scheduled bronchodilators, and prn albuterol.  - IS, encourage to get up and OOB. - Continue mucinex   Chronic HFpEF, HTN: - Echocardiogram reassuring, doubt acute on chronic HFpEF, so will continue lasix 20mg  daily.  - Continue losartan, diltiazem  Anxiety:  - Continue lexapro and xanax.   Fall at home, debility:  - Encourage OOB with assistance. PT and OT assessing.  - We will maximize home health services and DME.  HLD:  - Continue statin  Recent UTI: Treated with keflex then macrobid, both susceptible per urine culture on 7/16.  [name], MD [facility] [email or web address] 07/22/2023, 10:39 AM

## 2023-07-23 ENCOUNTER — Inpatient Hospital Stay (HOSPITAL_COMMUNITY): Payer: Medicare Other

## 2023-07-23 DIAGNOSIS — J9601 Acute respiratory failure with hypoxia: Secondary | ICD-10-CM | POA: Diagnosis not present

## 2023-07-23 DIAGNOSIS — J9602 Acute respiratory failure with hypercapnia: Secondary | ICD-10-CM | POA: Diagnosis not present

## 2023-07-23 MED ORDER — IPRATROPIUM-ALBUTEROL 0.5-2.5 (3) MG/3ML IN SOLN
3.0000 mL | Freq: Two times a day (BID) | RESPIRATORY_TRACT | Status: DC
  Administered 2023-07-23 – 2023-07-24 (×2): 3 mL via RESPIRATORY_TRACT
  Filled 2023-07-23 (×2): qty 3

## 2023-07-23 MED ORDER — METHYLPREDNISOLONE SODIUM SUCC 40 MG IJ SOLR
40.0000 mg | Freq: Every day | INTRAMUSCULAR | Status: DC
  Administered 2023-07-24: 40 mg via INTRAVENOUS
  Filled 2023-07-23: qty 1

## 2023-07-23 MED ORDER — IOHEXOL 350 MG/ML SOLN
75.0000 mL | Freq: Once | INTRAVENOUS | Status: AC | PRN
  Administered 2023-07-23: 75 mL via INTRAVENOUS

## 2023-07-23 NOTE — Plan of Care (Signed)
°  Problem: Coping: °Goal: Level of anxiety will decrease °Outcome: Progressing °  °

## 2023-07-23 NOTE — Progress Notes (Signed)
SATURATION QUALIFICATIONS: (This note is used to comply with regulatory documentation for home oxygen)   Patient Saturations on 3 Liters of oxygen while Ambulating = 81%  Leeanne Deed, Noreene Filbert, RN

## 2023-07-23 NOTE — Progress Notes (Signed)
[facility] PROGRESS NOTE  [name] (DOB: 1951-08-10) [id number] PCP: Benita Stabile, MD  Brief Narrative: [name] is a 72 y.o. female with a history of COPD on chronic supplemental oxygen, HTN, HLD, OSA intolerant of CPAP, and recent admission for fall at home with E. coli UTI (discharged reluctantly per noted on 7/19) who presented to the ED on 07/19/2023 with shortness of breath, admitted for acute on chronic respiratory failure, AECOPD, rule out pneumonia. She feels better, though remains hypoxic beyond her baseline. Her hypoxia continues despite improvement in pulmonary exam. CTA chest is planned for further evaluation.  Subjective: Again feels a bit better, but winded just getting to [facility]. No chest pain, no unilateral leg swelling. SpO2 to 81% despite being on her home 3L O2 this PM   Objective: BP (!) 166/89 (BP Location: Right Arm)   Pulse 81   Temp 97.9 F (36.6 C) (Oral)   Resp 18   Ht 5\' 3"  (1.6 m)   Wt 83.6 kg   SpO2 90%   BMI 32.65 kg/m   Gen: No distress HEENT: Right lateral strabismus is stable and chronic  Pulm: No wheezes, improvement in L base crackles.  CV: RRR, no JVD or pitting edema GI: +BS, soft, NT, ND Neuro: Alert, oriented, no deficits  Skin: Right periorbital ecchymosis is stable without new rashes  Assessment & Plan: Acute on chronic hypoxic and hypercarbic respiratory failure: Multifactorial, including AECOPD, acute bronchitis on untreated OSA: - Ambulatory pulse oximetry today showed persistent significant desaturations not typical of her baseline. This is despite improvement in COPD by exam. Has completed 5 days of antibiotics as of today. We will check CTA chest to r/o PE and evaluate parenchyma.  - Continue IV steroids  - Continue scheduled bronchodilators, and prn albuterol.  - IS, encourage to get up and OOB. - Continue mucinex   Chronic HFpEF, HTN: - Echocardiogram reassuring, doubt acute on chronic HFpEF, so will  continue lasix 20mg  daily. RV not well visualized. - Continue losartan, diltiazem  Anxiety:  - Continue lexapro and xanax.   Fall at home, debility:  - Encourage OOB with assistance. PT and OT assessing.  - We will maximize home health services and DME.  HLD:  - Continue statin  Recent UTI: Treated with keflex then macrobid, both susceptible per urine culture on 7/16.  [name], MD [facility] [email or web address] 07/23/2023, 2:21 PM

## 2023-07-24 MED ORDER — PREDNISONE 10 MG PO TABS: ORAL_TABLET | ORAL | 0 refills | Status: DC

## 2023-07-24 NOTE — Consult Note (Signed)
   [facility] Inpatient Consult   07/24/2023  JAZARIAH ROSCH 11-13-1951 [id number]  [facility] [[facility]]  Accountable Care Organization [ACO] Patient:  [employer] Medicare  Primary Care Provider:  Benita Stabile, MD    Patient screened for less than 7 days readmission hospitalization with noted medium  risk score for unplanned readmission risk 5 day length of stay and to assess for potential [facility]  [[facility]] Care Management service needs for post hospital transition for care coordination.  Review of patient's electronic medical record reveals patient is for home with [facility] coverage and DME.  Went by room and patient already transitioned from hospital room.   Plan:  No current needs assessed at this time.  Of note, [facility] Care Management/Population Health does not replace or interfere with any arrangements made by the Inpatient Transition of Care team.  For questions contact:   Charlesetta Shanks, RN BSN CCM [facility]  [phone] business mobile phone Toll free office [phone]  *Concierge Line  [phone] Fax number: [phone] [email or web address]

## 2023-07-24 NOTE — TOC Transition Note (Signed)
Transition of Care [facility]) - CM/SW Discharge Note   Patient Details  Name: [name] MRN: [id number] Date of Birth: 08/22/1951  [facility] CM/SW Contact:  Leone Haven, RN Phone Number: 07/24/2023, 12:38 PM   Clinical Narrative:    Patient is for dc today, the [facility] is in the patient's room. She is set up with [facility] for [facility] services.  Patient states her ride will be her at 4 pm.  They will bring oxygen tank for her to go home with.    Final next level of care: Home w Home Health Services Barriers to Discharge: Continued Medical Work up   Patient Goals and CMS Choice      Discharge Placement                         Discharge Plan and Services Additional resources added to the After Visit Summary for                                       Social Determinants of Health (SDOH) Interventions SDOH Screenings   Food Insecurity: No Food Insecurity (07/12/2023)  Housing: Low Risk  (07/12/2023)  Transportation Needs: No Transportation Needs (07/12/2023)  Utilities: Not At Risk (07/12/2023)  Tobacco Use: Medium Risk (07/19/2023)     Readmission Risk Interventions    07/12/2023   12:24 PM  Readmission Risk Prevention Plan  Transportation Screening Complete  PCP or Specialist Appt within 5-7 Days Complete  Home Care Screening Complete  Medication Review (RN CM) Complete

## 2023-07-24 NOTE — Progress Notes (Signed)
Occupational Therapy Treatment Patient Details Name: [name] MRN: [id number] DOB: Apr 20, 1951 Today's Date: 07/24/2023   History of present illness 72 yo female with SOB and weakness, fever, wheezing and low O2 sats was admitted 7/24.  Had recent admission for UTI.  Dx with acute on chronic hypercarbic and hypoxic resp failure, in setting of COPD and acute CHF with HCAP, acute bronchitis.  Has reduced her O2 need, referred for PT.  PMHx: COPD, CHF, bronchiectasis, pulm nodules, nicotine addiction, heart murmur, pyelonephritis, e coli, bacteremia, HTN,   OT comments  Pt progressing towards goals, able to complete UB/LB dressing with supervision- min guard A, pt seated EOB  upon arrival, eager to walk. Pt SpO2 87% at lowest on 4L O2, pt implementing PLB and activity pacing tasks, educated on use of shower seat and other energy conservation strategies for home, pt verbalized understanding. SpO2 low 90'son 4L O2 at end of session. Pt presenting with impairments listed below, will follow acutely. Updating d/c recommendation to HHOT.   Recommendations for follow up therapy are one component of a multi-disciplinary discharge planning process, led by the attending physician.  Recommendations may be updated based on patient status, additional functional criteria and insurance authorization.    Assistance Recommended at Discharge PRN  Patient can return home with the following  Assistance with cooking/housework;Assist for transportation;Help with stairs or ramp for entrance   Equipment Recommendations  BSC/3in1    Recommendations for Other Services      Precautions / Restrictions Precautions Precautions: Fall Restrictions Weight Bearing Restrictions: No       Mobility Bed Mobility               General bed mobility comments: seated EOB upon arrival and departure    Transfers Overall transfer level: Needs assistance Equipment used: None Transfers: Sit to/from Stand, Bed to  chair/wheelchair/BSC Sit to Stand: Supervision                 Balance Overall balance assessment: Mild deficits observed, not formally tested                                         ADL either performed or assessed with clinical judgement   ADL Overall ADL's : Needs assistance/impaired                 Upper Body Dressing : Sitting;Min guard   Lower Body Dressing: Min guard;Sitting/lateral leans   Toilet Transfer: Supervision/safety;Ambulation;Regular Toilet           Functional mobility during ADLs: Supervision/safety      Extremity/Trunk Assessment Upper Extremity Assessment Upper Extremity Assessment: Generalized weakness   Lower Extremity Assessment Lower Extremity Assessment: Defer to PT evaluation        Vision   Vision Assessment?: No apparent visual deficits   Perception Perception Perception: Not tested   Praxis Praxis Praxis: Not tested    Cognition Arousal/Alertness: Awake/alert Behavior During Therapy: Anxious Overall Cognitive Status: No family/caregiver present to determine baseline cognitive functioning                                          Exercises      Shoulder Instructions       General Comments SpO2 down to 87% on 4L O2, increased to  low 90's with cues for PLB    Pertinent Vitals/ Pain       Pain Assessment Pain Assessment: No/denies pain  Home Living                                          Prior Functioning/Environment              Frequency  Min 1X/week        Progress Toward Goals  OT Goals(current goals can now be found in the care plan section)  Progress towards OT goals: Progressing toward goals  Acute Rehab OT Goals Patient Stated Goal: none stated OT Goal Formulation: With patient Time For Goal Achievement: 08/04/23 Potential to Achieve Goals: Good ADL Goals Pt/caregiver will Perform Home Exercise Program: Increased strength;Both  right and left upper extremity;With theraband;With Supervision;With written HEP provided Additional ADL Goal #1: Pt will complete basic ADLs mod I. Additional ADL Goal #2: Pt will slow pace and implement energy conservation strategies in ADLs and mobility.  Plan Frequency remains appropriate;Discharge plan needs to be updated    Co-evaluation                 AM-PAC OT "6 Clicks" Daily Activity     Outcome Measure   Help from another person eating meals?: None Help from another person taking care of personal grooming?: A Little Help from another person toileting, which includes using toliet, bedpan, or urinal?: A Little Help from another person bathing (including washing, rinsing, drying)?: A Little Help from another person to put on and taking off regular upper body clothing?: A Little Help from another person to put on and taking off regular lower body clothing?: A Little 6 Click Score: 19    End of Session Equipment Utilized During Treatment: Oxygen (4L)  OT Visit Diagnosis: History of falling (Z91.81);Muscle weakness (generalized) (M62.81);Unsteadiness on feet (R26.81)   Activity Tolerance Patient tolerated treatment well   Patient Left in bed;with call bell/phone within reach;with nursing/sitter in room   Nurse Communication Mobility status        Time: 2130-8657 OT Time Calculation (min): 12 min  Charges: OT General Charges $OT Visit: 1 Visit OT Treatments $Self Care/Home Management : 8-22 mins  [name], OTD, OTR/L SecureChat Preferred Acute Rehab [phone]   [name] 07/24/2023, 1:57 PM

## 2023-07-24 NOTE — Discharge Summary (Signed)
[name] WNU:[id number] DOB: [birth year] DOA: 07/19/2023  PCP: Benita Stabile, MD  Admit date: 07/19/2023 Discharge date: 07/24/2023  Time spent: 35 minutes  Recommendations for Outpatient Follow-up:  Pcp and pulmonology f/u     Discharge Diagnoses:  Principal Problem:   Acute respiratory failure with hypoxia and hypercapnia (HCC) Active Problems:   COPD with acute exacerbation (HCC)   Chronic diastolic CHF (congestive heart failure) (HCC)   Leukocytosis   Elevated troponin   Essential hypertension   History of UTI   Anxiety   Discharge Condition: stable  Diet recommendation: heart healthy  Filed Weights   07/22/23 0439 07/23/23 0407 07/24/23 0501  Weight: 83.1 kg 83.6 kg 84.1 kg    History of present illness:  From admission h and p [name] is a 72 y.o. female with medical history significant of hypertension, hyperlipidemia, unspecified CHF, COPD on home oxygen 3-4L, prior history of E. coli bacteremia, anxiety, urinary retention, and tobacco abuse who presented with acutely worsening shortness of breath and weakness.   Recently admitted in the hospital from 7/17-7/19 after having a fall at home with trauma to her head.  Noted to have acute metabolic encephalopathy which was thought secondary to fall with head trauma and had also been found to have concern for urinary tract infection growing E. coli for which patient received 2 days of Rocephin and was discharged home on nitrofurantoin.  She reported completing course of nitrofurantoin as prescribed.  Last night patient reported having progressively worsening shortness of breath yesterday evening.  Reported having associated symptoms of subjective fever, wheezing, substernal chest pain, and weakness for which she was unable to get up to go to the bathroom.  Denied having any nausea, vomiting, or leg swelling.  She has not smoked in several years.   On EMS  arrival patient noted to have O2 saturations of 67% on 3 L  placed on a nonrebreather at 15 L with improvement in O2 saturations up to mid 90s.  Patient had received 2 DuoNeb breathing treatments and Solu-Medrol 125 mg IV.      In the emergency department patient was noted to be afebrile, respirations 15-28, blood pressures elevated up to 179/93, and O2 saturations currently maintained on 3 L of nasal cannula oxygen.  Labs significant for WBC 13, hemoglobin 11, CO2 39, glucose 152, calcium 8.7, BNP 425.4, high-sensitivity troponin 36->32. Venous blood gas significant for pH 7.4, pCO2 83.8, and O2 95.  Chest x-ray noted similar appearance with increased interstitial prominence and diffuse peribronchial cuffing suggestive of background acute bronchitis.  Patient has been given breathing treatments, magnesium sulfate 2 g IV, Lasix 40 mg IV, and fentanyl 50 mcg IV.    Hospital Course:  Patient was treated with acute exacerbation of copd complicated by acute on chronic hypoxic hypercarbic respiratory failure. Treated with IV steroids and ceftriaxone/azithromycin. Symptomatically much improved. Weaned down to home 3 liters at rest. PT advised SNF but patient declines, will order home health PT/OT instead. Patient ambulated unassisted day of discharge and deems herself close to her baseline, requests discharge today.  Other chronic conditions stable. Will discharge home with oral steroid taper and close pcp/pulm f/u.  Procedures: none   Consultations: none  Discharge Exam: Vitals:   07/24/23 0737 07/24/23 0750  BP:  (!) 165/76  Pulse:  86  Resp:  18  Temp:  98.3 F (36.8 C)  SpO2: 98% 93%    General: NAD Cardiovascular: RRR Respiratory: Few scattered rhonchi  Discharge  Instructions   Discharge Instructions     Diet - low sodium heart healthy   Complete by: As directed    Increase activity slowly   Complete by: As directed       Allergies as of 07/24/2023   No Known Allergies      Medication List     STOP taking these medications     melatonin 5 MG Tabs   nitrofurantoin (macrocrystal-monohydrate) 100 MG capsule Commonly known as: Macrobid       TAKE these medications    albuterol (2.5 MG/3ML) 0.083% nebulizer solution Commonly known as: PROVENTIL Take 2.5 mg by nebulization every 4 (four) hours as needed for wheezing or shortness of breath.   albuterol 108 (90 Base) MCG/ACT inhaler Commonly known as: VENTOLIN HFA Inhale 2 puffs into the lungs every 6 (six) hours as needed for wheezing or shortness of breath. For shortness of breath   ALPRAZolam 0.25 MG tablet Commonly known as: XANAX Take 0.25 mg by mouth 2 (two) times daily as needed for anxiety.   aspirin EC 81 MG tablet Take 81 mg by mouth in the morning. Swallow whole.   atorvastatin 80 MG tablet Commonly known as: LIPITOR Take 1 tablet (80 mg total) by mouth daily.   bethanechol 10 MG tablet Commonly known as: URECHOLINE Take 1 tablet (10 mg total) by mouth in the morning and at bedtime.   diltiazem 180 MG 24 hr capsule Commonly known as: CARDIZEM CD Take 1 capsule (180 mg total) by mouth daily.   escitalopram 5 MG tablet Commonly known as: LEXAPRO Take 5 mg by mouth daily.   furosemide 20 MG tablet Commonly known as: LASIX Take 1 tablet (20 mg total) by mouth daily. What changed:  when to take this reasons to take this   HYDROcodone-acetaminophen 10-325 MG tablet Commonly known as: NORCO Take 1 tablet by mouth 4 (four) times daily as needed for moderate pain.   losartan 50 MG tablet Commonly known as: COZAAR Take 50 mg by mouth daily.   mirtazapine 7.5 MG tablet Commonly known as: REMERON Take 7.5 mg by mouth at bedtime as needed (Sleep).   multivitamin with minerals Tabs tablet Take 1 tablet by mouth in the morning.   pantoprazole 20 MG tablet Commonly known as: PROTONIX Take 1 tablet (20 mg total) by mouth daily.   predniSONE 10 MG tablet Commonly known as: DELTASONE 3 tabs daily for 3 days, then 2 tabs daily for 3  days, then 1 tab daily for 3 days   tamsulosin 0.4 MG Caps capsule Commonly known as: FLOMAX Take 1 capsule (0.4 mg total) by mouth daily after supper.   Trelegy Ellipta 100-62.5-25 MCG/INH Aepb Generic drug: Fluticasone-Umeclidin-Vilant Take 1 puff by mouth in the morning.               Durable Medical Equipment  (From admission, onward)           Start     Ordered   07/21/23 1534  For home use only DME Bedside commode  Once       Question:  Patient needs a bedside commode to treat with the following condition  Answer:  Weakness   07/21/23 1534           No Known Allergies  Follow-up Information     [facility] Follow up.   Why: Bedside commode Contact information: [address] [place] [address] [phone]         Sandrea Hughs  B, MD Follow up.   Specialty: Pulmonary Disease Contact information: [address] [place] [address] [phone]         Benita Stabile, MD Follow up.   Specialty: Internal Medicine Contact information: [address] [place] [address] [phone]                  The results of significant diagnostics from this hospitalization (including imaging, microbiology, ancillary and laboratory) are listed below for reference.    Significant Diagnostic Studies: CT Angio Chest Pulmonary Embolism (PE) W or WO Contrast  Result Date: 07/23/2023 CLINICAL DATA:  High probability of pulmonary embolism. EXAM: CT ANGIOGRAPHY CHEST WITH CONTRAST TECHNIQUE: Multidetector CT imaging of the chest was performed using the standard protocol during bolus administration of intravenous contrast. Multiplanar CT image reconstructions and MIPs were obtained to evaluate the vascular anatomy. RADIATION DOSE REDUCTION: This exam was performed according to the departmental dose-optimization program which includes automated exposure control, adjustment of the mA and/or kV according to patient size and/or  use of iterative reconstruction technique. CONTRAST:  75mL OMNIPAQUE IOHEXOL 350 MG/ML SOLN COMPARISON:  June 30, 2023. FINDINGS: Cardiovascular: Satisfactory opacification of the pulmonary arteries to the segmental level. No evidence of pulmonary embolism. Mild cardiomegaly. No pericardial effusion. Mediastinum/Nodes: No enlarged mediastinal, hilar, or axillary lymph nodes. Thyroid gland, trachea, and esophagus demonstrate no significant findings. Lungs/Pleura: No pneumothorax or pleural effusion is noted. Stable probable right posterior basilar scarring or atelectasis is noted. Stable scarring is seen involving the right upper lobe. Volume loss of the right lung is noted with hyperexpansion of the left lung stable left basilar scarring is noted. Calcified pleural plaques are again noted in the right lung base consistent with asbestos exposure. Upper Abdomen: No acute abnormality. Musculoskeletal: No chest wall abnormality. No acute or significant osseous findings. Review of the MIP images confirms the above findings. IMPRESSION: No definite evidence of pulmonary embolus. Stable chronic findings most likely representing scarring or atelectasis seen in right lung, as well as calcified pleural plaques in right lung base consistent with asbestos exposure. Aortic Atherosclerosis (ICD10-I70.0) and Emphysema (ICD10-J43.9). Electronically Signed   By: Lupita Raider M.D.   On: 07/23/2023 16:26   ECHOCARDIOGRAM COMPLETE  Result Date: 07/19/2023    ECHOCARDIOGRAM REPORT   Patient Name:   MIRANDA MARMER Date of Exam: 07/19/2023 Medical Rec #:  [id number]        Height:       63.0 in Accession #:    [id number]       Weight:       188.8 lb Date of Birth:  [birth year]        BSA:          1.887 m Patient Age:    71 years         BP:           133/77 mmHg Patient Gender: F                HR:           100 bpm. Exam Location:  Inpatient Procedure: 2D Echo, Cardiac Doppler and Color Doppler Indications:    CHF  History:         Patient has prior history of Echocardiogram examinations, most                 recent 06/30/2022. CHF, COPD, Signs/Symptoms:Murmur; Risk  Factors:Current Smoker.  Sonographer:    Darlys Gales Referring Phys: [id number] RONDELL A SMITH IMPRESSIONS  1. Left ventricular ejection fraction, by estimation, is 55 to 60%. The left ventricle has normal function. The left ventricle has no regional wall motion abnormalities. Left ventricular diastolic parameters are consistent with Grade II diastolic dysfunction (pseudonormalization).  2. Right ventricular systolic function was not well visualized. The right ventricular size is not well visualized.  3. Left atrial size was moderately dilated.  4. The mitral valve is grossly normal. Trivial mitral valve regurgitation. No evidence of mitral stenosis. Moderate mitral annular calcification.  5. The aortic valve is grossly normal. There is mild calcification of the aortic valve. There is mild thickening of the aortic valve. Aortic valve regurgitation is not visualized. Mild aortic valve stenosis.  6. The inferior vena cava is normal in size with greater than 50% respiratory variability, suggesting right atrial pressure of 3 mmHg. Comparison(s): No significant change from prior study. FINDINGS  Left Ventricle: Left ventricular ejection fraction, by estimation, is 55 to 60%. The left ventricle has normal function. The left ventricle has no regional wall motion abnormalities. The left ventricular internal cavity size was normal in size. There is  no left ventricular hypertrophy. Left ventricular diastolic parameters are consistent with Grade II diastolic dysfunction (pseudonormalization). Right Ventricle: The right ventricular size is not well visualized. Right vetricular wall thickness was not assessed. Right ventricular systolic function was not well visualized. Left Atrium: Left atrial size was moderately dilated. Right Atrium: Right atrial size was not well visualized.  Pericardium: There is no evidence of pericardial effusion. Mitral Valve: The mitral valve is grossly normal. Moderate mitral annular calcification. Trivial mitral valve regurgitation. No evidence of mitral valve stenosis. Tricuspid Valve: The tricuspid valve is not well visualized. Tricuspid valve regurgitation is trivial. No evidence of tricuspid stenosis. Aortic Valve: The aortic valve is grossly normal. There is mild calcification of the aortic valve. There is mild thickening of the aortic valve. Aortic valve regurgitation is not visualized. Mild aortic stenosis is present. Aortic valve mean gradient measures 13.0 mmHg. Aortic valve peak gradient measures 24.4 mmHg. Aortic valve area, by VTI measures 2.23 cm. Pulmonic Valve: The pulmonic valve was not well visualized. Pulmonic valve regurgitation is not visualized. Aorta: The aortic root was not well visualized and the ascending aorta was not well visualized. Venous: The inferior vena cava is normal in size with greater than 50% respiratory variability, suggesting right atrial pressure of 3 mmHg. IAS/Shunts: The interatrial septum was not well visualized.  LEFT VENTRICLE PLAX 2D LVIDd:         4.00 cm   Diastology LVIDs:         3.00 cm   LV e' medial:    5.55 cm/s LV PW:         1.00 cm   LV E/e' medial:  24.7 LV IVS:        1.00 cm   LV e' lateral:   6.85 cm/s LVOT diam:     1.80 cm   LV E/e' lateral: 20.0 LV SV:         105 LV SV Index:   56 LVOT Area:     2.54 cm  LEFT ATRIUM             Index LA Vol (A2C):   41.7 ml 22.10 ml/m LA Vol (A4C):   78.1 ml 41.39 ml/m LA Biplane Vol: 58.4 ml 30.95 ml/m  AORTIC VALVE AV Area (Vmax):  2.05 cm AV Area (Vmean):   2.03 cm AV Area (VTI):     2.23 cm AV Vmax:           247.00 cm/s AV Vmean:          168.000 cm/s AV VTI:            0.471 m AV Peak Grad:      24.4 mmHg AV Mean Grad:      13.0 mmHg LVOT Vmax:         199.00 cm/s LVOT Vmean:        134.000 cm/s LVOT VTI:          0.413 m LVOT/AV VTI ratio: 0.88  MITRAL VALVE MV Area (PHT): 4.65 cm     SHUNTS MV Decel Time: 163 msec     Systemic VTI:  0.41 m MV E velocity: 137.00 cm/s  Systemic Diam: 1.80 cm MV A velocity: 146.00 cm/s MV E/A ratio:  0.94 Jodelle Red MD Electronically signed by Jodelle Red MD Signature Date/Time: 07/19/2023/10:50:20 PM    Final    DG Chest Portable 1 View  Result Date: 07/19/2023 CLINICAL DATA:  72 year old female with history of respiratory distress and low oxygen saturations. Possible/pulmonary edema. EXAM: PORTABLE CHEST 1 VIEW COMPARISON:  Chest x-ray 07/11/2023. FINDINGS: Lung volumes are normal. Widespread interstitial prominence and patchy peribronchial cuffing noted throughout the lungs bilaterally, most severe in the mid to lower lungs, where there also new areas of architectural distortion in ill-defined opacities, which correspond to areas of apparent post infectious or inflammatory scarring noted on prior chest CT. Chronic pleural-parenchymal thickening in the right hemithorax with some calcified right-sided pleural plaques, similar to the prior study. No definite left pleural effusion. No pneumothorax. No definite cephalization of the pulmonary vasculature. Heart size is mildly enlarged. IMPRESSION: 1. Radiographic appearance the chest appears similar to the prior study, with exception of increasing interstitial prominence and diffuse peribronchial cuffing, suggesting a background of acute bronchitis. Electronically Signed   By: Trudie Reed M.D.   On: 07/19/2023 05:27   DG Hip Unilat W or Wo Pelvis 2-3 Views Left  Result Date: 07/12/2023 CLINICAL DATA:  Fall with left hip pain. EXAM: DG HIP (WITH OR WITHOUT PELVIS) 2-3V LEFT COMPARISON:  Left femur films from 07/07/2023. FINDINGS: No fracture evident. SI joints and symphysis pubis unremarkable. No hip dislocation. AP and frog-leg lateral views of the left hip show no femoral neck fracture. No substantial degenerative change in the left hip.  IMPRESSION: Negative. Electronically Signed   By: Kennith Center M.D.   On: 07/12/2023 07:06   CT Head Wo Contrast  Result Date: 07/12/2023 CLINICAL DATA:  Blunt poly trauma. EXAM: CT HEAD WITHOUT CONTRAST CT CERVICAL SPINE WITHOUT CONTRAST TECHNIQUE: Multidetector CT imaging of the head and cervical spine was performed following the standard protocol without intravenous contrast. Multiplanar CT image reconstructions of the cervical spine were also generated. RADIATION DOSE REDUCTION: This exam was performed according to the departmental dose-optimization program which includes automated exposure control, adjustment of the mA and/or kV according to patient size and/or use of iterative reconstruction technique. COMPARISON:  06/30/2022 FINDINGS: CT HEAD FINDINGS Brain: No evidence of acute infarction, hemorrhage, hydrocephalus, extra-axial collection or mass lesion/mass effect. Vascular: No hyperdense vessel or unexpected calcification. Skull: Normal. Negative for fracture or focal lesion. Sinuses/Orbits: No acute finding. CT CERVICAL SPINE FINDINGS Alignment: Normal. Skull base and vertebrae: No acute fracture. No primary bone lesion or focal pathologic process. Soft tissues and spinal  canal: No prevertebral fluid or swelling. No visible canal hematoma. Disc levels:  Generalized degenerative endplate and facet spurring. Upper chest: Opacity and volume loss at the right apex. IMPRESSION: No evidence of acute intracranial or cervical spine injury. Electronically Signed   By: Tiburcio Pea M.D.   On: 07/12/2023 05:55   CT Cervical Spine Wo Contrast  Result Date: 07/12/2023 CLINICAL DATA:  Blunt poly trauma. EXAM: CT HEAD WITHOUT CONTRAST CT CERVICAL SPINE WITHOUT CONTRAST TECHNIQUE: Multidetector CT imaging of the head and cervical spine was performed following the standard protocol without intravenous contrast. Multiplanar CT image reconstructions of the cervical spine were also generated. RADIATION DOSE  REDUCTION: This exam was performed according to the departmental dose-optimization program which includes automated exposure control, adjustment of the mA and/or kV according to patient size and/or use of iterative reconstruction technique. COMPARISON:  06/30/2022 FINDINGS: CT HEAD FINDINGS Brain: No evidence of acute infarction, hemorrhage, hydrocephalus, extra-axial collection or mass lesion/mass effect. Vascular: No hyperdense vessel or unexpected calcification. Skull: Normal. Negative for fracture or focal lesion. Sinuses/Orbits: No acute finding. CT CERVICAL SPINE FINDINGS Alignment: Normal. Skull base and vertebrae: No acute fracture. No primary bone lesion or focal pathologic process. Soft tissues and spinal canal: No prevertebral fluid or swelling. No visible canal hematoma. Disc levels:  Generalized degenerative endplate and facet spurring. Upper chest: Opacity and volume loss at the right apex. IMPRESSION: No evidence of acute intracranial or cervical spine injury. Electronically Signed   By: Tiburcio Pea M.D.   On: 07/12/2023 05:55   DG Chest Port 1 View  Result Date: 07/11/2023 CLINICAL DATA:  Shortness of breath EXAM: PORTABLE CHEST 1 VIEW COMPARISON:  11/03/2022, CT chest 06/30/2023 FINDINGS: Volume loss right thorax. Pleuroparenchymal scarring in the right lower lung, stable. Enlarged cardiomediastinal silhouette. Aortic atherosclerosis. IMPRESSION: 1. No active disease. 2. Volume loss right thorax with stable pleuroparenchymal scarring in the right lower lung. Cardiomegaly Electronically Signed   By: Jasmine Pang M.D.   On: 07/11/2023 19:26   DG FEMUR MIN 2 VIEWS LEFT  Result Date: 07/07/2023 CLINICAL DATA:  Leg pain, warmth EXAM: LEFT FEMUR 2 VIEWS COMPARISON:  None Available. FINDINGS: There is no evidence of fracture or other focal bone lesions. Hip joint appears within normal limits. Tricompartmental osteoarthritis of the left knee. Soft tissues are unremarkable. IMPRESSION: 1. No  acute fracture or dislocation of the left femur. 2. Tricompartmental osteoarthritis of the left knee. Electronically Signed   By: Duanne Guess D.O.   On: 07/07/2023 15:01   US Venous Img Lower Unilateral Left  Result Date: 07/07/2023 CLINICAL DATA:  Left lateral mid thigh pain and burning for 2 days EXAM: LEFT LOWER EXTREMITY VENOUS DOPPLER ULTRASOUND TECHNIQUE: Gray-scale sonography with compression, as well as color and duplex ultrasound, were performed to evaluate the deep venous system(s) from the level of the common femoral vein through the popliteal and proximal calf veins. COMPARISON:  None available FINDINGS: VENOUS Normal compressibility of the common femoral, superficial femoral, and popliteal veins, as well as the visualized calf veins. Visualized portions of profunda femoral vein and great saphenous vein unremarkable. No filling defects to suggest DVT on grayscale or color Doppler imaging. Doppler waveforms show normal direction of venous flow, normal respiratory plasticity and response to augmentation. Limited views of the contralateral common femoral vein are unremarkable. OTHER No discrete sonographic abnormality of the area of pain in the left lateral hip. Limitations: none IMPRESSION: No DVT of the left lower extremity. Electronically Signed   By:  Mauri Reading  Mir M.D.   On: 07/07/2023 14:28   CT Chest Wo Contrast  Result Date: 07/07/2023 CLINICAL DATA:  Follow-up pulmonary nodule EXAM: CT CHEST WITHOUT CONTRAST TECHNIQUE: Multidetector CT imaging of the chest was performed following the standard protocol without IV contrast. RADIATION DOSE REDUCTION: This exam was performed according to the departmental dose-optimization program which includes automated exposure control, adjustment of the mA and/or kV according to patient size and/or use of iterative reconstruction technique. COMPARISON:  CT of the chest 12/01/2022 FINDINGS: Cardiovascular: No significant vascular findings. Normal heart  size. No pericardial effusion. There are atherosclerotic calcifications of the aorta. Mediastinum/Nodes: There is a mildly enlarged subcarinal lymph node measuring 10 mm, unchanged. No other enlarged lymph nodes are seen. Visualized esophagus and thyroid gland are within normal limits. Lungs/Pleura: Mild emphysematous changes are again seen. Airspace disease and bronchiectasis in the inferior right lower lobe has mildly increased. There is a new small amount of focal airspace disease in the inferior anterior left lower lobe. Again seen is scarring in the right lung apex. There is a new ill-defined nodular density in the left upper lobe measuring 5 mm image 4/68. There is a 6 mm left upper lobe nodule image 4/49 which has decreased in size. There is a new nodular airspace opacity in the left upper lobe measuring 12 by 9 mm image 4/49. Previously identified right upper lobe nodule measuring 13 mm has completely resolved in the interval. There is right pleural thickening and calcification, unchanged. There is no pleural effusion or pneumothorax. Upper Abdomen: No acute abnormality. Musculoskeletal: No chest wall mass or suspicious bone lesions identified. IMPRESSION: 1. Increasing focal airspace disease in the right lower lobe with new small amount of airspace disease in the left lower lobe worrisome for multifocal pneumonia. 2. Previously identified right upper lobe nodule has completely resolved. 3. There are new nodular airspace opacities in the left upper lobe measuring up to 12 mm. Findings may be infectious/inflammatory, but neoplasm can not be excluded. 4. Stable mildly enlarged subcarinal lymph node. 5. Short-term follow-up CT recommended in 2-3 months to re-evaluate. Aortic Atherosclerosis (ICD10-I70.0) and Emphysema (ICD10-J43.9). Electronically Signed   By: Darliss Cheney M.D.   On: 07/07/2023 00:05   CT ABDOMEN PELVIS W CONTRAST  Result Date: 07/05/2023 CLINICAL DATA:  Right-sided abdominal pain EXAM: CT  ABDOMEN AND PELVIS WITH CONTRAST TECHNIQUE: Multidetector CT imaging of the abdomen and pelvis was performed using the standard protocol following bolus administration of intravenous contrast. RADIATION DOSE REDUCTION: This exam was performed according to the departmental dose-optimization program which includes automated exposure control, adjustment of the mA and/or kV according to patient size and/or use of iterative reconstruction technique. CONTRAST:  OMNIPAQUE IOHEXOL 300 MG/ML  SOLN COMPARISON:  CT 06/30/2022, chest CT 12/01/2022, 06/25/2020 FINDINGS: Lower chest: See separately dictated chest CT. Chronic pleural and parenchymal disease at the right base. New area of consolidation and ground-glass disease at the left lung base. Hepatobiliary: Hepatic steatosis. No calcified gallstone or biliary dilatation Pancreas: Unremarkable. No pancreatic ductal dilatation or surrounding inflammatory changes. Spleen: Normal in size without focal abnormality. Adrenals/Urinary Tract: Right adrenal gland is normal. Stable left adrenal gland nodule, consistent with adenoma. No imaging follow-up is recommended. Kidneys show no hydronephrosis. Mild slightly asymmetric bladder wall thickening with perivesical stranding. Stomach/Bowel: Stomach nonenlarged. No dilated small bowel. No acute bowel wall thickening. Diverticular disease of the left colon. Negative appendix Vascular/Lymphatic: Moderate severe aortic atherosclerosis. No aneurysm. No suspicious lymph nodes. Reproductive: Hysterectomy. No adnexal  mass. Small volume gas in the vagina. Other: Negative for pelvic effusion or free air. Musculoskeletal: No acute or suspicious osseous abnormality. IMPRESSION: 1. Mild slightly asymmetric bladder wall thickening with perivesical stranding, question cystitis. Correlate with urinalysis. 2. Hepatic steatosis. 3. Diverticular disease of the left colon without acute bowel wall thickening. 4. New area of nodular consolidation and  ground-glass disease at the left lung base, see separately dictated chest CT 5. Aortic atherosclerosis. Aortic Atherosclerosis (ICD10-I70.0). Electronically Signed   By: Jasmine Pang M.D.   On: 07/05/2023 17:52    Microbiology: Recent Results (from the past 240 hour(s))  Resp panel by RT-PCR (RSV, Flu A&B, Covid) Anterior Nasal Swab     Status: None   Collection Time: 07/19/23 11:32 AM   Specimen: Anterior Nasal Swab  Result Value Ref Range Status   SARS Coronavirus 2 by RT PCR NEGATIVE NEGATIVE Final   Influenza A by PCR NEGATIVE NEGATIVE Final   Influenza B by PCR NEGATIVE NEGATIVE Final    Comment: (NOTE) The Xpert Xpress SARS-CoV-2/FLU/RSV plus assay is intended as an aid in the diagnosis of influenza from Nasopharyngeal swab specimens and should not be used as a sole basis for treatment. Nasal washings and aspirates are unacceptable for Xpert Xpress SARS-CoV-2/FLU/RSV testing.  Fact Sheet for Patients: BloggerCourse.com  Fact Sheet for Healthcare Providers: SeriousBroker.it  This test is not yet approved or cleared by the [place] FDA and has been authorized for detection and/or diagnosis of SARS-CoV-2 by FDA under an Emergency Use Authorization (EUA). This EUA will remain in effect (meaning this test can be used) for the duration of the COVID-19 declaration under Section 564(b)(1) of the Act, 21 U.S.C. section 360bbb-3(b)(1), unless the authorization is terminated or revoked.     Resp Syncytial Virus by PCR NEGATIVE NEGATIVE Final    Comment: (NOTE) Fact Sheet for Patients: BloggerCourse.com  Fact Sheet for Healthcare Providers: SeriousBroker.it  This test is not yet approved or cleared by the [place] FDA and has been authorized for detection and/or diagnosis of SARS-CoV-2 by FDA under an Emergency Use Authorization (EUA). This EUA will remain in effect  (meaning this test can be used) for the duration of the COVID-19 declaration under Section 564(b)(1) of the Act, 21 U.S.C. section 360bbb-3(b)(1), unless the authorization is terminated or revoked.  Performed at [facility], [address], [place], [place] [address]   Respiratory (~20 pathogens) panel by PCR     Status: None   Collection Time: 07/19/23 11:32 AM   Specimen: Anterior Nasal Swab; Respiratory  Result Value Ref Range Status   Adenovirus NOT DETECTED NOT DETECTED Final   Coronavirus 229E NOT DETECTED NOT DETECTED Final    Comment: (NOTE) The Coronavirus on the Respiratory Panel, DOES NOT test for the novel  Coronavirus (2019 nCoV)    Coronavirus HKU1 NOT DETECTED NOT DETECTED Final   Coronavirus NL63 NOT DETECTED NOT DETECTED Final   Coronavirus OC43 NOT DETECTED NOT DETECTED Final   Metapneumovirus NOT DETECTED NOT DETECTED Final   Rhinovirus / Enterovirus NOT DETECTED NOT DETECTED Final   Influenza A NOT DETECTED NOT DETECTED Final   Influenza B NOT DETECTED NOT DETECTED Final   Parainfluenza Virus 1 NOT DETECTED NOT DETECTED Final   Parainfluenza Virus 2 NOT DETECTED NOT DETECTED Final   Parainfluenza Virus 3 NOT DETECTED NOT DETECTED Final   Parainfluenza Virus 4 NOT DETECTED NOT DETECTED Final   Respiratory Syncytial Virus NOT DETECTED NOT DETECTED Final   Bordetella pertussis  NOT DETECTED NOT DETECTED Final   Bordetella Parapertussis NOT DETECTED NOT DETECTED Final   Chlamydophila pneumoniae NOT DETECTED NOT DETECTED Final   Mycoplasma pneumoniae NOT DETECTED NOT DETECTED Final    Comment: Performed at [facility], [address] [place], [place] [address]     Labs: Basic Metabolic Panel: Recent Labs  Lab 07/19/23 0358 07/19/23 0405 07/19/23 0803 07/20/23 0500 07/21/23 0105  NA 135 129* 131* 134* 135  K 4.1 5.9* 3.4* 3.9 3.9  CL 81*  --   --  83* 86*  CO2 39*  --   --  41* 41*  GLUCOSE 152*  --   --  132* 168*  BUN 10  --   --  12 19   CREATININE 0.57  --   --  0.64 0.79  CALCIUM 8.7*  --   --  8.4* 8.4*  MG  --   --   --   --  2.3   Liver Function Tests: Recent Labs  Lab 07/19/23 0358 07/21/23 0105  AST 18 11*  ALT 21 18  ALKPHOS 68 52  BILITOT 0.6 0.4  PROT 6.7 6.2*  ALBUMIN 3.6 3.2*   No results for input(s): "LIPASE", "AMYLASE" in the last 168 hours. No results for input(s): "AMMONIA" in the last 168 hours. CBC: Recent Labs  Lab 07/19/23 0358 07/19/23 0405 07/19/23 0803 07/20/23 0500 07/21/23 0105  WBC 13.0*  --   --  10.9* 11.6*  NEUTROABS 10.3*  --   --   --  10.6*  HGB 11.0* 12.9 12.2 10.7* 9.9*  HCT 38.4 38.0 36.0 36.5 33.7*  MCV 102.4*  --   --  99.7 102.4*  PLT 193  --   --  208 192   Cardiac Enzymes: No results for input(s): "CKTOTAL", "CKMB", "CKMBINDEX", "TROPONINI" in the last 168 hours. BNP: BNP (last 3 results) Recent Labs    07/19/23 0358  BNP 425.4*    ProBNP (last 3 results) No results for input(s): "PROBNP" in the last 8760 hours.  CBG: No results for input(s): "GLUCAP" in the last 168 hours.     Signed:  Silvano Bilis MD.  [facility] 07/24/2023, 12:19 PM

## 2023-07-24 NOTE — Progress Notes (Signed)
Mobility Specialist Progress Note:    07/24/23 1232  Mobility  Activity Ambulated with assistance in hallway  Level of Assistance Standby assist, set-up cues, supervision of patient - no hands on  Assistive Device None  Distance Ambulated (ft) 92 ft  Activity Response Tolerated well  Mobility Referral Yes  $Mobility charge 1 Mobility  Mobility Specialist Start Time (ACUTE ONLY) 1158  Mobility Specialist Stop Time (ACUTE ONLY) 1207  Mobility Specialist Time Calculation (min) (ACUTE ONLY) 9 min   Pt received in bed, very eager to ambulate. Pt ambulated on 4L/min, VSS. SPO2 88%-92% during ambulation, pt took x3 standing break for SPO2 to return SPO2 to 92%. Pt assisted back to bed w/ call bell and personal belongings in reach. All needs met.  Thompson Grayer Mobility Specialist  Please contact vis Secure Chat or  Rehab Office [phone]

## 2023-07-24 NOTE — Plan of Care (Signed)
  Problem: Activity: Goal: Risk for activity intolerance will decrease Outcome: Progressing   

## 2023-08-09 ENCOUNTER — Ambulatory Visit: Payer: Medicare Other | Admitting: Urology

## 2023-08-12 NOTE — Progress Notes (Unsigned)
[name], female    DOB: 1951/07/12    MRN: [id number]   Brief patient profile:  72  yowf  quit smoking 2014 and on 02 since 2009  referred to pulmonary clinic in [place]  07/26/2022 by Eboni for copd GOLD 4  / 02 dep  then admit:  Admit date: 06/29/2022 Discharge date: 07/07/2022 Admitted From: Home Disposition: SNF Recommendations for Outpatient Follow-up:  Please obtain BMP and CBC with differential in 1 week Outpatient follow-up with pulmonology about left lower lobe nodule Please follow up on the following pending results: None        Hospital course 72 year old F with PMH of COPD/chronic RF on 2 L, diastolic CHF, HTN, chronic back pain, urinary retention and anxiety presenting with progressive confusion and fever, and admitted for severe sepsis and acute metabolic encephalopathy due to acute pyelonephritis and E. coli bacteremia.  Patient was febrile to 101.7 with leukocytosis to 22 and hypotension to 90/41.  She was lethargic and had an AKI with creatinine of 1.3.  She also had elevated troponin to 902 that has trended up to 1114.  EKG without significant acute ischemic finding.  CT head negative.  CT chest abdomen and pelvis with contrast showed 7 mm LLL nodule, left pyelonephritis and minimal fullness of proximal ureter without obstruction.  Cultures obtained.  Foley catheter placed.  Patient was started on IV fluid and broad-spectrum antibiotics and admitted.  Cardiology consulted.    TTE with LVEF of 55 to 60%, G1 DD and moderate TVR.  Nuclear stress test with normal LV perfusion and no evidence of ischemia but poor quality due to patient's movement.    CTA chest PE protocol on 7/8 with increased clustered ill-defined GG nodularity in the superior segment of LLL and scattered tiny centrilobular nodules in both lungs consistent with infectious or inflammatory bronchiolitis and concern for aspiration pneumonia given the small amount of secretion in trachea.  MRI lumbar spine  without acute finding or osteomyelitis but multilevel spondylosis and moderate bilateral neuroforaminal stenosis at L5-S1.   Patient blood culture grew pansensitive E. coli.  Antibiotic de-escalated to IV Ancef.  She is discharged on p.o. cefadroxil for 6 more days to complete treatment course for E. coli bacteremia and pyelonephritis.    Therapy recommended SNF.   See individual problem list below for more.    Problems addressed during this hospitalization Principal Problem:   Severe sepsis (HCC) Active Problems:   Urinary retention   COPD exacerbation (HCC)   Anxiety   Acute on chronic diastolic CHF (congestive heart failure) (HCC)   Troponin level elevated   Acute pyelonephritis   E coli bacteremia   Acute metabolic encephalopathy   Acute on chronic respiratory failure with hypoxia and hypercapnia (HCC)   Urinary tract infection   Chronic lower back pain   Pulmonary nodule   Obesity (BMI 30-39.9)   Normocytic anemia   Severe sepsis due to acute left pyelonephritis and E. coli bacteremia in the setting of urinary retention, and possible aspiration pneumonia: POA.  Patient had fever, leukocytosis, hypotension, AKI and encephalopathy on presentation.  Blood culture with pansensitive E. coli.  Imaging concerning for left pyelonephritis and left ureteral fullness without obstructing stone.  Imaging also raises concern about left lung pneumonia and possible aspiration. -7/5 Vanco and cefepime>> 7/6 ceftriaxone> 7/10 Ancef> 7/13-7/19 cefadroxil -Doxycycline 7/11-7/15   Acute metabolic encephalopathy: Likely due to the above.  She is also at risk for polypharmacy due to pain medications other sedating  medications.  Resolved. -Reorientation and delirium precautions -Minimize sedating medications   Possible aspiration pneumonia: CTA chest negative for PE but raises concern for LLL pneumonia with concern for aspiration pneumonia given fluid in trachea. -Low risk for aspiration per SLP.    Acute on chronic diastolic CHF: Evidence of fluid overload per exam by previous attending.  Likely in the setting of fluid resuscitation for sepsis.  TTE with normal LVEF, G1 DD and RVSP of 44.  BNP elevated but improved with diuretics.  Diuresed with IV Lasix.  Net -8.5 L.  Appears euvolemic on exam -P.o. Lasix 20 mg daily -Reassess BMP and fluid status at follow-up.   COPD exacerbation: Resolved. -Continue LABA/LAMA/ICS -As needed albuterol -Minimum oxygen to keep saturation above 90%   Acute on chronic hypoxic and hypercarbic respiratory failure/OSA not on CPAP: On 2 L at baseline.  -Minimum oxygen to keep saturation above 90%.  She is high risk for CO2 retention   Acute kidney injury: Resolved. Recent Labs (within last 365 days)             Recent Labs    06/29/22 2057 06/30/22 0451 07/01/22 0207 07/02/22 0122 07/03/22 0221 07/04/22 0049 07/05/22 0231 07/06/22 0301 07/07/22 0035  BUN 26* 25* 30* 23 20 21 19 15 12   CREATININE 1.29* 1.12* 0.94 0.83 0.67 0.91 0.86 0.79 0.64    -Recheck BMP in 1 week   Atypical chest pain/elevated troponin: Troponin elevated to 900 and peaked at 1114.  Demand ischemia?  TTE as above.  Nuclear stress test negative but poor quality.  Chest pain could be due to pneumonia as well.  Symptoms resolved.  Cardiology signed off.  At this point, we can say non-STEMI ruled out. -Continue aspirin and statin.   Essential hypertension: Normotensive off home Hyzaar. -Continue home Cardizem CD -P.o. Lasix 20 mg daily   Urinary retention: History of this.  She had Foley placed in ED. -Continue Flomax and bethanechol -Passed voiding trial.   Chronic low back pain-lumbar MRI without acute finding but spondylolysis with moderate bilateral neuroforaminal stenosis at L5-S1. -Continue therapy at SNF -Continue home Norco but not a great choice with her respiratory issue.   Normocytic anemia: Stable. -Recheck CBC at follow-up   8 mm ground-glass nodule in  the left lower lobe.  PCP to arrange outpatient pulmonary follow-up within 1 to 2 weeks of discharge for follow-up and monitoring.   Obesity Body mass index is 32.26 kg/m.     History of Present Illness  07/26/2022  Pulmonary/ 1st office eval/ [name] / [place] Office home from NH x one week p above admit  Chief Complaint  Patient presents with   Consult    Consult for O2 dropping normally uses 2LO2 cont.   Dyspnea:  indoor walking room to room  Cough: no am flares/ sporadic daytime min mucoid production Sleep: on side bed is flat 2 pillows  SABA use: hfa 3-4 x daily  02:  2lpm hs and does not titrate daytime Rec Continue trelegy one click each am  - take two good drags then rinse gargle  Only use your albuterol as a rescue medication Ok to try albuterol 15 min before an activity (on alternating days)  that you know would usually make you short of breath Make sure you check your oxygen saturation  AT  your highest level of activity (not after you stop)   to be sure it stays over 90%    10/18/2022  f/u ov/ office/[name] re: GOLD  4 maint on 02 24/7 and trelegy   Chief Complaint  Patient presents with   Follow-up    Doing well. Has concerns about a lung nodule mentioned from CT scan in July   Dyspnea:  walking more and planning walking outside, does not understand saba pre challenge or titration of 02 with exertion  Cough: much better  Sleeping: flat bed / 2 pillows  SABA use: hfa rarely / never neb  02: 2lpm hs up to 3lpm with sats 90 on 3lpm  Covid status: vax max  Rec Also  Ok to try albuterol 15 min before an activity (on alternating days)  that you know would usually make you short of breath  Make sure you check your oxygen saturation  AT  your highest level of activity (not after you stop)   to be sure it stays over 90%    06/30/2023  f/u ov/[place] office/[name] re: GOLD 4/ 02 dep copd  maint on trelegy  downhill x months / some better p abx from Dr Margo Aye for what  she says was a virus but no ongoing fever, chills but new R flank pain "x months" no pleuritic quality  Chief Complaint  Patient presents with   Follow-up  Dyspnea:  room to room at home 3lpm continuous Cough:  min rattle > clear mucus only  Sleeping: flat bed  2 pillows s resp cc  SABA use: 3 x daily only increases  after 02: 3lpm 24/7 Rec Make sure you check your oxygen saturation  AT  your highest level of activity (not after you stop)   to be sure it stays over 90%   My office will be contacting you by phone for referral to [employer] for best fit for ambulatory 0xygen  Please schedule a follow up office visit in 6 weeks, call sooner if needed with all medications /inhalers/ solutions in hand     Admit date: 07/19/2023 Discharge date: 07/24/2023   Discharge Diagnoses:  Principal Problem:   Acute respiratory failure with hypoxia and hypercapnia (HCC)   COPD with acute exacerbation (HCC)   Chronic diastolic CHF (congestive heart failure) (HCC)   Leukocytosis   Elevated troponin   Essential hypertension   History of UTI   Anxiety        History of present illness:  From admission h and p th medical history significant of hypertension, hyperlipidemia, unspecified CHF, COPD on home oxygen 3-4L, prior history of E. coli bacteremia, anxiety, urinary retention, and tobacco abuse who presented with acutely worsening shortness of breath and weakness.   Recently admitted in the hospital from 7/17-7/19 after having a fall at home with trauma to her head.  Noted to have acute metabolic encephalopathy which was thought secondary to fall with head trauma and had also been found to have concern for urinary tract infection growing E. coli for which patient received 2 days of Rocephin and was discharged home on nitrofurantoin.  She reported completing course of nitrofurantoin as prescribed.  Last night patient reported having progressively worsening shortness of breath yesterday evening.  Reported  having associated symptoms of subjective fever, wheezing, substernal chest pain, and weakness for which she was unable to get up to go to the bathroom.      On EMS  arrival patient noted to have O2 saturations of 67% on 3 L placed on a nonrebreather at 15 L with improvement in O2 saturations up to mid 90s.  Patient had received 2 DuoNeb breathing treatments and Solu-Medrol  125 mg IV.      In the emergency department patient was noted to be afebrile, respirations 15-28, blood pressures elevated up to 179/93, and O2 saturations currently maintained on 3 L of nasal cannula oxygen.  Labs significant for WBC 13, hemoglobin 11, CO2 39, glucose 152, calcium 8.7, BNP 425.4, high-sensitivity troponin 36->32. Venous blood gas significant for pH 7.4, pCO2 83.8, and O2 95.  Chest x-ray noted similar appearance with increased interstitial prominence and diffuse peribronchial cuffing suggestive of background acute bronchitis.  Patient has been given breathing treatments, magnesium sulfate 2 g IV, Lasix 40 mg IV, and fentanyl 50 mcg IV.     Hospital Course:  Patient was treated with acute exacerbation of copd complicated by acute on chronic hypoxic hypercarbic respiratory failure. Treated with IV steroids and ceftriaxone/azithromycin. Symptomatically much improved. Weaned down to home 3 liters at rest. PT advised SNF but patient declines, will order home health PT/OT instead. Patient ambulated unassisted day of discharge and deems herself close to her baseline, requests discharge today.  Other chronic conditions stable. Will discharge home with oral steroid taper and close pcp/pulm f/u.       08/14/2023  f/u ov/[place] office/[name] re: GOLD 4 COPD/02 dep  maint on trelegy 100 but  did not  bring meds  Chief Complaint  Patient presents with   COPD    Gold 4  Dyspnea:  best ever felt on prednisone x5  days p discharge and slowly downhill since/ does not recall taking macrodantin Cough: none  Sleeping: flat bed 2  pillows no resp cc  SABA use: tid inhalation / neb  02: 3.5 conc at hs  and daily  on 4 lpm POC out      No obvious day to day or daytime variability or assoc excess/ purulent sputum or mucus plugs or hemoptysis or cp or chest tightness, subjective wheeze or overt sinus or hb symptoms.    Also denies any obvious fluctuation of symptoms with weather or environmental changes or other aggravating or alleviating factors except as outlined above   No unusual exposure hx or h/o childhood pna/ asthma or knowledge of premature birth.  Current Allergies, Complete Past Medical History, Past Surgical History, Family History, and Social History were reviewed in [employer] record.  ROS  The following are not active complaints unless bolded Hoarseness, sore throat, dysphagia, dental problems, itching, sneezing,  nasal congestion or discharge of excess mucus or purulent secretions, ear ache,   fever, chills, sweats, unintended wt loss or wt gain, classically pleuritic or exertional cp,  orthopnea pnd or arm/hand swelling  or leg swelling, presyncope, palpitations, abdominal pain, anorexia, nausea, vomiting, diarrhea  or change in bowel habits or change in bladder habits, change in stools or change in urine, dysuria, hematuria,  rash, arthralgias, visual complaints, headache, numbness, weakness or ataxia or problems with walking or coordination,  change in mood or  memory.        Current Meds  Medication Sig   albuterol (PROVENTIL) (2.5 MG/3ML) 0.083% nebulizer solution Take 2.5 mg by nebulization every 4 (four) hours as needed for wheezing or shortness of breath.   albuterol (VENTOLIN HFA) 108 (90 Base) MCG/ACT inhaler Inhale 2 puffs into the lungs every 6 (six) hours as needed for wheezing or shortness of breath. For shortness of breath   ALPRAZolam (XANAX) 0.25 MG tablet Take 0.25 mg by mouth 2 (two) times daily as needed for anxiety.   aspirin EC 81 MG tablet Take  81 mg by mouth in  the morning. Swallow whole.   atorvastatin (LIPITOR) 80 MG tablet Take 1 tablet (80 mg total) by mouth daily.   bethanechol (URECHOLINE) 10 MG tablet Take 1 tablet (10 mg total) by mouth in the morning and at bedtime.   diltiazem (CARDIZEM CD) 180 MG 24 hr capsule Take 1 capsule (180 mg total) by mouth daily.   escitalopram (LEXAPRO) 5 MG tablet Take 5 mg by mouth daily.   furosemide (LASIX) 20 MG tablet Take 1 tablet (20 mg total) by mouth daily. (Patient taking differently: Take 20 mg by mouth daily as needed for fluid or edema.)   HYDROcodone-acetaminophen (NORCO) 10-325 MG tablet Take 1 tablet by mouth 4 (four) times daily as needed for moderate pain.   losartan (COZAAR) 50 MG tablet Take 50 mg by mouth daily.   mirtazapine (REMERON) 7.5 MG tablet Take 7.5 mg by mouth at bedtime as needed (Sleep).   Multiple Vitamin (MULTIVITAMIN WITH MINERALS) TABS tablet Take 1 tablet by mouth in the morning.   pantoprazole (PROTONIX) 20 MG tablet Take 1 tablet (20 mg total) by mouth daily.   predniSONE (DELTASONE) 10 MG tablet 3 tabs daily for 3 days, then 2 tabs daily for 3 days, then 1 tab daily for 3 days   tamsulosin (FLOMAX) 0.4 MG CAPS capsule Take 1 capsule (0.4 mg total) by mouth daily after supper.   TRELEGY ELLIPTA 100-62.5-25 MCG/INH AEPB Take 1 puff by mouth in the morning.           Past Medical History:  Diagnosis Date   Anxiety    Atrophic vaginitis 10/09/2015   Baker's cyst    left leg   Blood in urine 10/09/2015   CHF (congestive heart failure) (HCC)    COPD (chronic obstructive pulmonary disease) (HCC)    Heart murmur    Hematuria 10/09/2015   Hypertension    Nicotine addiction 04/07/2014   Pneumonia    Urinary urgency 01/25/2016   Vaginal bleeding 10/09/2015      Objective:    Wts  08/14/2023        185  06/30/2023          180  10/18/22 175 lb 12.8 oz (79.7 kg)  08/08/22 170 lb (77.1 kg)  07/26/22 170 lb 3.2 oz (77.2 kg)   Vital signs reviewed  08/14/2023  - Note at  rest 02 sats  91% on 4lpm cont but only 74% on POC 4lpm on arrival    General appearance:    chronically ill easily frustrated elderly amb wf nad  '  HEENT :  Oropharynx  clear   Nasal turbinates nl    NECK :  without JVD/Nodes/TM/ nl carotid upstrokes bilaterally   LUNGS: no acc muscle use,  Mod barrel  contour chest wall with bilateral  Distant bs s audible wheeze and  without cough on insp or exp maneuvers and mod  Hyperresonant  to  percussion bilaterally     CV:  RRR  no s3 or murmur or increase in P2, and no edema   ABD:  soft and nontender with pos mid insp Hoover's  in the supine position. No bruits or organomegaly appreciated, bowel sounds nl  MS:   Ext warm without deformities or   obvious joint restrictions , calf tenderness, cyanosis or clubbing  SKIN: warm and dry without lesions    NEURO:  alert, approp, nl sensorium with  no motor or cerebellar deficits apparent.  I personally reviewed images and agree with radiology impression as follows:   Chest CTa   07/23/23    No definite evidence of pulmonary embolus.   Stable chronic findings most likely representing scarring or atelectasis seen in right lung, as well as calcified pleural plaques in right lung base consistent with asbestos exposure.   Aortic Atherosclerosis (ICD10-I70.0) and Emphysema (ICD10-J43.9).      Assessment

## 2023-08-14 ENCOUNTER — Encounter: Payer: Self-pay | Admitting: Internal Medicine

## 2023-08-14 ENCOUNTER — Ambulatory Visit: Payer: Medicare Other | Admitting: Internal Medicine

## 2023-08-14 VITALS — BP 130/74 | HR 87 | Wt 185.0 lb

## 2023-08-14 DIAGNOSIS — J9622 Acute and chronic respiratory failure with hypercapnia: Secondary | ICD-10-CM

## 2023-08-14 DIAGNOSIS — J449 Chronic obstructive pulmonary disease, unspecified: Secondary | ICD-10-CM | POA: Diagnosis not present

## 2023-08-14 DIAGNOSIS — J9621 Acute and chronic respiratory failure with hypoxia: Secondary | ICD-10-CM

## 2023-08-14 DIAGNOSIS — Z87891 Personal history of nicotine dependence: Secondary | ICD-10-CM | POA: Diagnosis not present

## 2023-08-14 DIAGNOSIS — Z79899 Other long term (current) drug therapy: Secondary | ICD-10-CM | POA: Diagnosis not present

## 2023-08-14 DIAGNOSIS — N10 Acute tubulo-interstitial nephritis: Secondary | ICD-10-CM | POA: Diagnosis not present

## 2023-08-14 DIAGNOSIS — Z87440 Personal history of urinary (tract) infections: Secondary | ICD-10-CM | POA: Diagnosis not present

## 2023-08-14 DIAGNOSIS — I10 Essential (primary) hypertension: Secondary | ICD-10-CM | POA: Diagnosis not present

## 2023-08-14 DIAGNOSIS — J9611 Chronic respiratory failure with hypoxia: Secondary | ICD-10-CM | POA: Diagnosis not present

## 2023-08-14 DIAGNOSIS — R339 Retention of urine, unspecified: Secondary | ICD-10-CM | POA: Diagnosis not present

## 2023-08-14 MED ORDER — CIPROFLOXACIN HCL 500 MG PO TABS: 500.0000 mg | ORAL_TABLET | Freq: Two times a day (BID) | ORAL | 0 refills | Status: DC

## 2023-08-14 MED ORDER — PREDNISONE 10 MG PO TABS: ORAL_TABLET | ORAL | 0 refills | Status: DC

## 2023-08-14 MED ORDER — ALBUTEROL SULFATE HFA 108 (90 BASE) MCG/ACT IN AERS: 2.0000 | INHALATION_SPRAY | RESPIRATORY_TRACT | 6 refills | Status: DC | PRN

## 2023-08-14 NOTE — Patient Instructions (Addendum)
Plan A = Automatic = Always=    Trelegy 100 one click each  tug hard to get it going and then full dep breath   Plan B = Backup (to supplement plan A, not to replace it) Only use your albuterol inhaler as a rescue medication to be used if you can't catch your breath by resting or doing a relaxed purse lip breathing pattern.  - The less you use it, the better it will work when you need it. - Ok to use the inhaler up to 2 puffs  every 4 hours if you must but call for appointment if use goes up over your usual need - Don't leave home without it !!  (think of it like the spare tire for your car)   Plan C = Crisis (instead of Plan B but only if Plan B stops working) - only use your albuterol nebulizer if you first try Plan B and it fails to help > ok to use the nebulizer up to every 4 hours but if start needing it regularly call for immediate appointment  Also  Ok to try albuterol 15 min before an activity (on alternating days)  that you know would usually make you short of breath and see if it makes any difference and if makes none then don't take albuterol after activity unless you can't catch your breath as this means it's the resting that helps, not the albuterol.  Make sure you check your oxygen saturation  AT  your highest level of activity (not after you stop)   to be sure it stays over 90% and adjust  02 flow upward to maintain this level if needed but remember to turn it back to previous settings when you stop (to conserve your supply).   Cipro 500 mg twice daily x 10 day    Prednisone 10 mg take  4 each am x 2 days,   2 each am x 2 days,  1 each am x 2 days and stop   My office will be contacting you by phone for referral to renal ultrasound   - if you don't hear back from my office within one week please call us back or notify us thru MyChart and we'll address it right away.   Please schedule a follow up office visit in 6 weeks, call sooner if needed with all medications /inhalers/  solutions in hand so we can verify exactly what you are taking. This includes all medications from all doctors and over the counters   Add:  My office will be contacting you by phone for referral to ambulatory 02 titration for BEST FIT for portable 02  - if you don't hear back from my office within one week please call us back or notify us thru MyChart and we'll address it right away.

## 2023-08-14 NOTE — Assessment & Plan Note (Signed)
HC03 baseline around 44 as of admit 06/2022  - 07/26/2022 patient walked at a slow pace on 3LO2 cont patient had to stop after 150 ft  due to SOB and being tired. Walked patient to her car after visit and on 3LO2 pulse, her O2 was at 67% on personal pulse ox but ours was low 90s  - 10/18/2022   Walked on 3lpm POC  x  1  lap(s) =  approx 150  ft  @ slow pace, stopped due to tired> sob  with lowest 02 sats  92%  - 06/30/2023   Walked on 4lpm cont  x  1  lap(s) =  approx 150  ft  @ moderately fast pace, stopped due to tired before sob  with lowest 02 sats 89% so referred to adapt for best fit for Portable with goal > 90% walking  > not follow instrtuctions 08/14/2023  - desats on 6lpmPOC x 75 Ft in office > referred for best FIT for portable 02   Likely element of macodantin lung or ALI from uti/sepsis   Advised: Make sure you check your oxygen saturation  AT  your highest level of activity (not after you stop)   to be sure it stays over 90% and adjust  02 flow upward to maintain this level if needed but remember to turn it back to previous settings when you stop (to conserve your supply).

## 2023-08-14 NOTE — Assessment & Plan Note (Addendum)
Quit smoking 2014  - PFT's  08/19/16  FEV1 0.54 (22 % ) ratio 0.43  p 1 % improvement from saba p Incruse prior to study with DLCO  10.53  (49%) and  FV curve classically concave     For mild flare off pred rec Prednisone 10 mg take  4 each am x 2 days,   2 each am x 2 days,  1 each am x 2 days and stop    Group D (now reclassified as E) in terms of symptom/risk and laba/lama/ICS  therefore appropriate rx at this point >>>  trelegy 100 qam and more approp saba  Re SABA :  I spent extra time with pt today reviewing appropriate use of albuterol for prn use on exertion with the following points: 1) saba is for relief of sob that does not improve by walking a slower pace or resting but rather if the pt does not improve after trying this first. 2) If the pt is convinced, as many are, that saba helps recover from activity faster then it's easy to tell if this is the case by re-challenging : ie stop, take the inhaler, then p 5 minutes try the exact same activity (intensity of workload) that just caused the symptoms and see if they are substantially diminished or not after saba 3) if there is an activity that reproducibly causes the symptoms, try the saba 15 min before the activity on alternate days   If in fact the saba really does help, then fine to continue to use it prn but advised may need to look closer at the maintenance regimen being used to achieve better control of airways disease with exertion.

## 2023-08-14 NOTE — Assessment & Plan Note (Addendum)
See admit 7/16/ 24 urine culture with E coli resistant to amp - recurrent symptoms 08/14/2023 > rec cipro 500 bid x 10 d and f/u renal u/s   Each maintenance medication was reviewed in detail including emphasizing most importantly the difference between maintenance and prns and under what circumstances the prns are to be triggered using an action plan format where appropriate.  Total time for H and P, chart review, counseling, reviewing dpi, hfa/ neb/02/pulse ox device(s) , directly observing portions of ambulatory 02 saturation study/ and generating customized AVS unique to this office visit / same day charting = 40 min post hosp f/u ov

## 2023-08-18 ENCOUNTER — Telehealth: Payer: Self-pay | Admitting: Internal Medicine

## 2023-08-18 NOTE — Telephone Encounter (Signed)
Pt calling back

## 2023-08-18 NOTE — Telephone Encounter (Signed)
Patient currently has a carbon level is 42 and the high is only supposed to be 29. Her oxygen continues to drop when she moves around. She wants to know how she can get her carbon dioxide levels down. Please call and advise [phone].

## 2023-08-22 ENCOUNTER — Ambulatory Visit (HOSPITAL_COMMUNITY)
Admission: RE | Admit: 2023-08-22 | Discharge: 2023-08-22 | Disposition: A | Discharge status: Home / Self Care | Payer: Medicare Other | Source: Ambulatory Visit | Attending: Internal Medicine | Admitting: Internal Medicine

## 2023-08-22 DIAGNOSIS — N12 Tubulo-interstitial nephritis, not specified as acute or chronic: Secondary | ICD-10-CM | POA: Diagnosis not present

## 2023-08-22 DIAGNOSIS — N10 Acute tubulo-interstitial nephritis: Secondary | ICD-10-CM | POA: Insufficient documentation

## 2023-08-22 NOTE — Telephone Encounter (Signed)
Patient called to report she has reviewed all of her recent labs (inpatient and Dr Margo Aye) and is concerned about her CO2 levels. Advised patient at this point it is not showing a critical level, and that her levels have been fairly consistent over the past month or so. Also advised if it was concerning enough the hospital would have addressed this while she was admitted. She would like you to review her labs and let her know if you have any suggestions. She states Dr. Margo Aye said he was sending you a message with his labs attached.  Patient also reports her O2 sats are dropping into the 70's with exertion/ambulating; she is typically on 4L cont at home, pulsed when out of house. She states her home concentrator only goes to 5L and the POC only goes to 4L.   Please advise, thank you!  ROV 09/26/23

## 2023-08-22 NOTE — Telephone Encounter (Signed)
Can't really address this over the phone - will need ov with all meds in hand and her portable 02 supply to regroup. Ok to add on Thursday pm

## 2023-08-22 NOTE — Telephone Encounter (Signed)
Patient calling [place] office. Routing to [employer].   Please call [phone]

## 2023-08-23 NOTE — Telephone Encounter (Signed)
Spoke with patient. She is no longer concerned about her CO2 levels, she talked to her PCP as well, and they informed her the levels are not concerning. She states none of her medications have changed. Advised patient to contact office if she has any change in her breathing; reviewed scheduled OV day/time. Nothing further needed at this time.

## 2023-08-23 NOTE — Telephone Encounter (Signed)
Spoke with patient at 08/23/23 8:01am. Patient states she is not able to come in Thursday 8/29 because she will be out of town. I informed patient word for word what Dr. Sherene Sires had said multiple times " Can't really address this over the phone - will need ov with all meds in hand and her portable 02 supply to regroup. Ok to add on Thursday pm  " And patient states that she wanted to see if Dr Sherene Sires would just call her over the phone despite the message and that "he knows my med list"  Patient also states she had an ultrasound done yesterday 08/22/23 and wanted to know the results of ultrasound and if Dr Sherene Sires could call her regarding that. Offered multiple times for Thursday/Friday and patient declined.  Please advise

## 2023-09-01 ENCOUNTER — Other Ambulatory Visit (HOSPITAL_COMMUNITY): Payer: Self-pay | Admitting: Family Medicine

## 2023-09-01 DIAGNOSIS — R911 Solitary pulmonary nodule: Secondary | ICD-10-CM

## 2023-09-07 ENCOUNTER — Telehealth: Payer: Self-pay | Admitting: Urology

## 2023-09-07 NOTE — Telephone Encounter (Signed)
Patient wants to know if she can switch tomorrows visit to a virtual , she just got out of the hospital

## 2023-09-08 ENCOUNTER — Ambulatory Visit: Payer: Medicare Other | Admitting: Urology

## 2023-09-08 NOTE — Telephone Encounter (Signed)
Patient called again she is too weak to come in office , she is on oxygen and would prefer telephone visit instead of rescheduling.

## 2023-09-12 NOTE — Telephone Encounter (Signed)
Patient is made aware and voiced understanding. Patient states provider has to call via telephone call.

## 2023-09-13 ENCOUNTER — Telehealth: Payer: Self-pay | Admitting: Urology

## 2023-09-13 ENCOUNTER — Ambulatory Visit (INDEPENDENT_AMBULATORY_CARE_PROVIDER_SITE_OTHER): Payer: Medicare Other

## 2023-09-13 DIAGNOSIS — R339 Retention of urine, unspecified: Secondary | ICD-10-CM | POA: Diagnosis not present

## 2023-09-13 NOTE — Progress Notes (Signed)
Patient present today with incomplete emptying of bladder. Bladder scan was 9 ml. Per Dr. Ronne Binning patient need to increase fluids and contact PCP about taking 2nd flid pill. Patient was unable to void so urine sample was not obtain.

## 2023-09-13 NOTE — Telephone Encounter (Signed)
Patient called unable to urinate , wants to take a 2nd fluid pill , she took one at 5am - 20 mg

## 2023-09-13 NOTE — Progress Notes (Deleted)
Name: [name] DOB: 11-19-1951 MRN: [id number]  History of Present Illness: Ms. [name] is a 72 y.o. female who presents today for follow up visit at [facility] . - GU history: 1. Incomplete bladder emptying. 2. Urinary urgency. 3. Vaginal atrophy.  At last visit with Dr. Ronne Binning on 08/08/2022: - Seen for hospital discharge follow up; admitted 06/29/2022 with UTI and sepsis.  - "She has a feeling of incomplete emptying. PVR 92cc. For the past 2 days she has noted worsening urinary urgency, frequency, and occasional dysuria. No other associated symptoms."  - The plan was to "continue flomax 0.4mg , decrease bethenacol to 10mg  BID".  Since last visit: > 09/13/2023 (***yesterday): Seen for urology nurse visit due to sensation of incomplete bladder emptying. PVR = 9 ml. "Per Dr. Ronne Binning patient need to increase fluids and contact PCP about taking 2nd flid pill. Patient was unable to void so urine sample was not obtain."  Today: She reports ***  She {Actions; denies-reports:120008} increased urinary urgency, frequency, nocturia, dysuria, gross hematuria, hesitancy, straining to void, or sensations of incomplete emptying.  She {Actions; denies-reports:120008} flank pain. She {Actions; denies-reports:120008} abdominal pain.   Fall Screening: Do you usually have a device to assist in your mobility? {yes/no:20286} ***cane / ***walker / ***wheelchair   Medications: Current Outpatient Medications  Medication Sig Dispense Refill   albuterol (PROVENTIL) (2.5 MG/3ML) 0.083% nebulizer solution Take 2.5 mg by nebulization every 4 (four) hours as needed for wheezing or shortness of breath.     albuterol (VENTOLIN HFA) 108 (90 Base) MCG/ACT inhaler Inhale 2 puffs into the lungs every 4 (four) hours as needed for wheezing or shortness of breath. For shortness of breath 18 g 6   ALPRAZolam (XANAX) 0.25 MG tablet Take 0.25 mg by mouth 2 (two) times daily as needed for anxiety.      aspirin EC 81 MG tablet Take 81 mg by mouth in the morning. Swallow whole.     atorvastatin (LIPITOR) 80 MG tablet Take 1 tablet (80 mg total) by mouth daily.     bethanechol (URECHOLINE) 10 MG tablet Take 1 tablet (10 mg total) by mouth in the morning and at bedtime. 180 tablet 3   ciprofloxacin (CIPRO) 500 MG tablet Take 1 tablet (500 mg total) by mouth 2 (two) times daily. 20 tablet 0   diltiazem (CARDIZEM CD) 180 MG 24 hr capsule Take 1 capsule (180 mg total) by mouth daily.     escitalopram (LEXAPRO) 5 MG tablet Take 5 mg by mouth daily.     furosemide (LASIX) 20 MG tablet Take 1 tablet (20 mg total) by mouth daily. (Patient taking differently: Take 20 mg by mouth daily as needed for fluid or edema.) 30 tablet    HYDROcodone-acetaminophen (NORCO) 10-325 MG tablet Take 1 tablet by mouth 4 (four) times daily as needed for moderate pain.     losartan (COZAAR) 50 MG tablet Take 50 mg by mouth daily.     mirtazapine (REMERON) 7.5 MG tablet Take 7.5 mg by mouth at bedtime as needed (Sleep).     Multiple Vitamin (MULTIVITAMIN WITH MINERALS) TABS tablet Take 1 tablet by mouth in the morning.     pantoprazole (PROTONIX) 20 MG tablet Take 1 tablet (20 mg total) by mouth daily.     predniSONE (DELTASONE) 10 MG tablet Take  4 each am x 2 days,   2 each am x 2 days,  1 each am x 2 days and stop 14 tablet 0  tamsulosin (FLOMAX) 0.4 MG CAPS capsule Take 1 capsule (0.4 mg total) by mouth daily after supper. 90 capsule 3   TRELEGY ELLIPTA 100-62.5-25 MCG/INH AEPB Take 1 puff by mouth in the morning.     No current facility-administered medications for this visit.    Allergies: Allergies  Allergen Reactions   Gabapentin Other (See Comments)    LIGHTHEADED/GROGGY    Past Medical History:  Diagnosis Date   Anxiety    Atrophic vaginitis 10/09/2015   Baker's cyst    left leg   Blood in urine 10/09/2015   CHF (congestive heart failure) (HCC)    COPD (chronic obstructive pulmonary disease) (HCC)     Heart murmur    Hematuria 10/09/2015   Hypertension    Nicotine addiction 04/07/2014   Oxygen dependent    4L/min [place] all the time   Pneumonia    Urinary retention 07/01/2020   Urinary urgency 01/25/2016   Vaginal bleeding 10/09/2015   Past Surgical History:  Procedure Laterality Date   ABDOMINAL HYSTERECTOMY     CATARACT EXTRACTION Left 03/2015   CESAREAN SECTION     COLONOSCOPY  10/19/2004   Dr. Jena Gauss- internal hemorrhoids, o/w normal rectum, normal colon   COLONOSCOPY N/A 07/30/2014   [id number] diverticulosis. Colonic polyp-removed TA. repeat TCS 07/2021   COLONOSCOPY WITH PROPOFOL N/A 03/29/2021   diverticulosis in sigmoid and descending colon, one 5 mm polyp ascending colon (adenoma), non-bleeding internal hemorrhoids. 5 year surveillance   ESOPHAGOGASTRODUODENOSCOPY N/A 07/30/2014   [id number] EGD   POLYPECTOMY  03/29/2021   Procedure: POLYPECTOMY;  Surgeon: Corbin Ade, MD;  Location: [facility];  Service: Endoscopy;;   RADIAL HEAD ARTHROPLASTY Left 06/23/2017   Procedure: RADIAL HEAD ARTHROPLASTY WITH LIGAMENT REPAIR;  Surgeon: Cammy Copa, MD;  Location: [facility] OR;  Service: Orthopedics;  Laterality: Left;   Family History  Adopted: Yes  Problem Relation Age of Onset   Cancer Sister        pancreatic   Migraines Daughter    Cancer Daughter        pre cancerous cells on cervix   Cancer Sister        breast cancer, colon   Blindness Maternal Grandfather    Other Brother        murdered   Other Sister        ruptured colon   Cancer Sister        breast, skin   Other Brother        was in MVA   Cancer Sister        pancreatic   Hypertension Sister    Other Sister        ruptured colon   Colon cancer Neg Hx    Social History   Socioeconomic History   Marital status: Widowed    Spouse name: Not on file   Number of children: Not on file   Years of education: Not on file   Highest education level: Not on file  Occupational History   Occupation:  retired  Tobacco Use   Smoking status: Former    Current packs/day: 0.00    Types: Cigarettes    Start date: 04/14/1973    Quit date: 04/14/2013    Years since quitting: 10.4    Passive exposure: Past   Smokeless tobacco: Never  Vaping Use   Vaping status: Never Used  Substance and Sexual Activity   Alcohol use: Yes    Comment: glass of wine maybe once a  year   Drug use: No   Sexual activity: Never    Birth control/protection: Surgical    Comment: hyst  Other Topics Concern   Not on file  Social History Narrative   Not on file   Social Determinants of Health   Financial Resource Strain: Not on file  Food Insecurity: No Food Insecurity (07/12/2023)   Hunger Vital Sign    Worried About Running Out of Food in the Last Year: Never true    Ran Out of Food in the Last Year: Never true  Transportation Needs: No Transportation Needs (07/12/2023)   PRAPARE - Administrator, Civil Service (Medical): No    Lack of Transportation (Non-Medical): No  Physical Activity: Not on file  Stress: Not on file  Social Connections: Not on file  Intimate Partner Violence: Not At Risk (07/12/2023)   Humiliation, Afraid, Rape, and Kick questionnaire    Fear of Current or Ex-Partner: No    Emotionally Abused: No    Physically Abused: No    Sexually Abused: No    Review of Systems Constitutional: Patient ***denies any unintentional weight loss or change in strength lntegumentary: Patient ***denies any rashes or pruritus Eyes: Patient denies ***dry eyes ENT: Patient ***denies dry mouth Cardiovascular: Patient ***denies chest pain or syncope Respiratory: Patient ***denies shortness of breath Gastrointestinal: Patient ***denies nausea, vomiting, constipation, or diarrhea Musculoskeletal: Patient ***denies muscle cramps or weakness Neurologic: Patient ***denies convulsions or seizures Psychiatric: Patient ***denies memory problems Allergic/Immunologic: Patient ***denies recent allergic  reaction(s) Hematologic/Lymphatic: Patient denies bleeding tendencies Endocrine: Patient ***denies heat/cold intolerance  GU: As per HPI.  OBJECTIVE There were no vitals filed for this visit. There is no height or weight on file to calculate BMI.  Physical Examination Constitutional: ***No obvious distress; patient is ***non-toxic appearing  Cardiovascular: ***No visible lower extremity edema.  Respiratory: The patient does ***not have audible wheezing/stridor; respirations do ***not appear labored  Gastrointestinal: Abdomen ***non-distended Musculoskeletal: ***Normal ROM of UEs  Skin: ***No obvious rashes/open sores  Neurologic: CN 2-12 grossly ***intact Psychiatric: Answered questions ***appropriately with ***normal affect  Hematologic/Lymphatic/Immunologic: ***No obvious bruises or sites of spontaneous bleeding  UA: ***negative / *** WBC/hpf, *** RBC/hpf, bacteria (***) PVR: *** ml  ASSESSMENT No diagnosis found. ***  Will plan for follow up in *** months / ***1 year or sooner if needed. Pt verbalized understanding and agreement. All questions were answered.  PLAN Advised the following: 1. *** 2. ***No follow-ups on file.  No orders of the defined types were placed in this encounter.   It has been explained that the patient is to follow regularly with their PCP in addition to all other providers involved in their care and to follow instructions provided by these respective offices. Patient advised to contact urology clinic if any urologic-pertaining questions, concerns, new symptoms or problems arise in the interim period.  There are no Patient Instructions on file for this visit.  Electronically signed by:  Donnita Falls, FNP   09/13/23    12:28 PM

## 2023-09-13 NOTE — Telephone Encounter (Signed)
Patient is unable to void and is advised to do a urine drop off/PVR. Patient is made aware that I can not advised her to take a 2nd fluid pill. Patient is also made aware to go to the ER if she is unable to come into the office today for UA/UC. Patient states she do not want to go to the ER and she will try to come in today for PVR and urine check. Patient voiced understanding.

## 2023-09-14 ENCOUNTER — Telehealth: Payer: Self-pay | Admitting: Urology

## 2023-09-14 ENCOUNTER — Ambulatory Visit: Payer: Medicare Other | Admitting: Urology

## 2023-09-14 DIAGNOSIS — N952 Postmenopausal atrophic vaginitis: Secondary | ICD-10-CM

## 2023-09-14 DIAGNOSIS — R339 Retention of urine, unspecified: Secondary | ICD-10-CM

## 2023-09-14 DIAGNOSIS — R3915 Urgency of urination: Secondary | ICD-10-CM

## 2023-09-14 NOTE — Telephone Encounter (Signed)
Return call patient. Patient states that she wants to do a urine drop off because she can not void. Patient was seen on 09/18 for PVR and urine drop off. Patient was unable to voided and PVR was 9. Patient was made aware to increase fluids. Patient called in today and states that she wants her urine check for infection. Patient states that she is having some itching. Patient was offer appointment for urine drop off, patient refused and states she will call back Monday to scheduled because she was on the toilet. Patient is made aware to call back at her earliest convinces. Patient voiced understanding.

## 2023-09-14 NOTE — Telephone Encounter (Signed)
Patient called she cancelled her appointment today with Maralyn Sago and she wants to just drop off urine sample, please call back and let her know if that is ok?

## 2023-09-18 ENCOUNTER — Ambulatory Visit: Payer: Medicare Other

## 2023-09-19 ENCOUNTER — Telehealth: Payer: Self-pay

## 2023-09-19 ENCOUNTER — Ambulatory Visit: Payer: Medicare Other

## 2023-09-19 NOTE — Telephone Encounter (Signed)
Patient's family member came to office today to drop off urine specimen for patient in a rubber maid container. Patient was made aware that the urine specimen have to be in a sterile cup and her sample was not in a sterile cup and we will not be able to accept specimen. Patient is made aware a sterile cup will be upfront with her name for her to do a urine specimen. Patient voiced understanding

## 2023-09-21 ENCOUNTER — Ambulatory Visit (INDEPENDENT_AMBULATORY_CARE_PROVIDER_SITE_OTHER): Payer: Medicare Other

## 2023-09-21 DIAGNOSIS — N390 Urinary tract infection, site not specified: Secondary | ICD-10-CM

## 2023-09-21 DIAGNOSIS — R339 Retention of urine, unspecified: Secondary | ICD-10-CM | POA: Diagnosis not present

## 2023-09-21 DIAGNOSIS — R399 Unspecified symptoms and signs involving the genitourinary system: Secondary | ICD-10-CM

## 2023-09-21 NOTE — Progress Notes (Addendum)
Patient presents today with complaints of  Urine checked for infection.  UA done today.  Dr. Deliah Boston reviewed results and No treatment needed. Patient aware of MD recommendations .      [id number], CMA

## 2023-09-22 LAB — URINALYSIS, ROUTINE W REFLEX MICROSCOPIC
Bilirubin, UA: NEGATIVE
Glucose, UA: NEGATIVE
Ketones, UA: NEGATIVE
Leukocytes,UA: NEGATIVE
Nitrite, UA: NEGATIVE
Protein,UA: NEGATIVE
RBC, UA: NEGATIVE
Specific Gravity, UA: 1.01 (ref 1.005–1.030)
Urobilinogen, Ur: 0.2 mg/dL (ref 0.2–1.0)
pH, UA: 6 (ref 5.0–7.5)

## 2023-09-26 ENCOUNTER — Ambulatory Visit: Payer: Medicare Other | Admitting: Internal Medicine

## 2023-10-06 ENCOUNTER — Encounter (HOSPITAL_COMMUNITY): Payer: Self-pay

## 2023-10-06 ENCOUNTER — Ambulatory Visit (HOSPITAL_COMMUNITY): Admission: RE | Admit: 2023-10-06 | Payer: Medicare Other | Source: Ambulatory Visit

## 2023-10-06 ENCOUNTER — Telehealth: Payer: Self-pay | Admitting: Internal Medicine

## 2023-10-06 MED ORDER — PREDNISONE 10 MG PO TABS: ORAL_TABLET | ORAL | 0 refills | Status: DC

## 2023-10-06 MED ORDER — CIPROFLOXACIN HCL 500 MG PO TABS: 500.0000 mg | ORAL_TABLET | Freq: Two times a day (BID) | ORAL | 0 refills | Status: DC

## 2023-10-06 NOTE — Telephone Encounter (Signed)
Called and spoke  with patient , advised pt that per Dr Sherene Sires she will need to go to er if bp was dropping, pt said that she wasn't going to ED. He could please please call her in prednisone. Informed Dr wert that pt refused to ED forward call to Dr Sherene Sires.

## 2023-10-06 NOTE — Telephone Encounter (Signed)
Needs to go to ER ASAP if bp dropping

## 2023-10-06 NOTE — Telephone Encounter (Signed)
Patient is calling back to check on the status of her message earlier this morning.  She states the her blood pressure is now dropping as well---Please call [phone]

## 2023-10-06 NOTE — Telephone Encounter (Signed)
Medication was sent to pharmacy.

## 2023-10-06 NOTE — Telephone Encounter (Signed)
Called and spoke with patient she said that she has been weak since this morning , not able to do any thing, she said that he oxygen level was dropped when she in non active when she is moving it goes up. She said that having SOB , at pt several type if she using inhaler or if she have any other symptoms pt kept say that she didn't want to hospital , pt wanted to know if she could have rx for prednisone she said she was given one in past. Please advise

## 2023-10-06 NOTE — Telephone Encounter (Signed)
Was feeling fine and yesterday woke up at 3 am 10/06/2023 weak and sob " just like before" and refused to go to ER.  No cp/ vomiting or dysuria/ abd pain/still taking po ok   "Just like we did before"  = cipri 500 mg bid x 10 d and pred x 6 days  Advised can try this one time but if getting worse over coming weekend should call 911 and / or go to ER immediately if she can get transportation

## 2023-10-06 NOTE — Telephone Encounter (Signed)
PT calling asking for some pred. States she can not get up off the couch.  No Fever Weak SOB   Pharm [facility] [place] [address]

## 2023-10-07 ENCOUNTER — Other Ambulatory Visit: Payer: Self-pay | Admitting: Pulmonary Disease

## 2023-10-07 ENCOUNTER — Inpatient Hospital Stay (HOSPITAL_COMMUNITY)
Admission: EM | Admit: 2023-10-07 | Discharge: 2023-10-17 | DRG: 871 | Disposition: A | Discharge status: Home Health | Payer: Medicare Other | Attending: Internal Medicine | Admitting: Internal Medicine

## 2023-10-07 ENCOUNTER — Encounter (HOSPITAL_COMMUNITY): Payer: Self-pay | Admitting: Emergency Medicine

## 2023-10-07 ENCOUNTER — Emergency Department (HOSPITAL_COMMUNITY): Payer: Medicare Other

## 2023-10-07 ENCOUNTER — Other Ambulatory Visit: Payer: Self-pay

## 2023-10-07 DIAGNOSIS — Z888 Allergy status to other drugs, medicaments and biological substances status: Secondary | ICD-10-CM

## 2023-10-07 DIAGNOSIS — E874 Mixed disorder of acid-base balance: Secondary | ICD-10-CM | POA: Diagnosis not present

## 2023-10-07 DIAGNOSIS — Z809 Family history of malignant neoplasm, unspecified: Secondary | ICD-10-CM

## 2023-10-07 DIAGNOSIS — G9341 Metabolic encephalopathy: Secondary | ICD-10-CM | POA: Diagnosis not present

## 2023-10-07 DIAGNOSIS — I4892 Unspecified atrial flutter: Secondary | ICD-10-CM | POA: Diagnosis not present

## 2023-10-07 DIAGNOSIS — J984 Other disorders of lung: Secondary | ICD-10-CM | POA: Diagnosis not present

## 2023-10-07 DIAGNOSIS — J440 Chronic obstructive pulmonary disease with acute lower respiratory infection: Secondary | ICD-10-CM | POA: Diagnosis present

## 2023-10-07 DIAGNOSIS — I517 Cardiomegaly: Secondary | ICD-10-CM | POA: Diagnosis not present

## 2023-10-07 DIAGNOSIS — Z6832 Body mass index (BMI) 32.0-32.9, adult: Secondary | ICD-10-CM

## 2023-10-07 DIAGNOSIS — J9622 Acute and chronic respiratory failure with hypercapnia: Secondary | ICD-10-CM | POA: Diagnosis not present

## 2023-10-07 DIAGNOSIS — R918 Other nonspecific abnormal finding of lung field: Secondary | ICD-10-CM | POA: Diagnosis not present

## 2023-10-07 DIAGNOSIS — J9612 Chronic respiratory failure with hypercapnia: Secondary | ICD-10-CM | POA: Diagnosis present

## 2023-10-07 DIAGNOSIS — Z8249 Family history of ischemic heart disease and other diseases of the circulatory system: Secondary | ICD-10-CM

## 2023-10-07 DIAGNOSIS — I10 Essential (primary) hypertension: Secondary | ICD-10-CM | POA: Diagnosis present

## 2023-10-07 DIAGNOSIS — J90 Pleural effusion, not elsewhere classified: Secondary | ICD-10-CM | POA: Diagnosis not present

## 2023-10-07 DIAGNOSIS — Z91199 Patient's noncompliance with other medical treatment and regimen due to unspecified reason: Secondary | ICD-10-CM

## 2023-10-07 DIAGNOSIS — I480 Paroxysmal atrial fibrillation: Secondary | ICD-10-CM | POA: Insufficient documentation

## 2023-10-07 DIAGNOSIS — J439 Emphysema, unspecified: Secondary | ICD-10-CM | POA: Diagnosis not present

## 2023-10-07 DIAGNOSIS — J9611 Chronic respiratory failure with hypoxia: Secondary | ICD-10-CM | POA: Diagnosis not present

## 2023-10-07 DIAGNOSIS — E662 Morbid (severe) obesity with alveolar hypoventilation: Secondary | ICD-10-CM | POA: Diagnosis present

## 2023-10-07 DIAGNOSIS — Z743 Need for continuous supervision: Secondary | ICD-10-CM | POA: Diagnosis not present

## 2023-10-07 DIAGNOSIS — F05 Delirium due to known physiological condition: Secondary | ICD-10-CM | POA: Diagnosis not present

## 2023-10-07 DIAGNOSIS — K219 Gastro-esophageal reflux disease without esophagitis: Secondary | ICD-10-CM | POA: Diagnosis present

## 2023-10-07 DIAGNOSIS — N179 Acute kidney failure, unspecified: Secondary | ICD-10-CM | POA: Diagnosis not present

## 2023-10-07 DIAGNOSIS — J9621 Acute and chronic respiratory failure with hypoxia: Secondary | ICD-10-CM | POA: Diagnosis present

## 2023-10-07 DIAGNOSIS — F32A Depression, unspecified: Secondary | ICD-10-CM | POA: Diagnosis present

## 2023-10-07 DIAGNOSIS — J9602 Acute respiratory failure with hypercapnia: Secondary | ICD-10-CM | POA: Diagnosis not present

## 2023-10-07 DIAGNOSIS — E871 Hypo-osmolality and hyponatremia: Secondary | ICD-10-CM | POA: Diagnosis present

## 2023-10-07 DIAGNOSIS — F419 Anxiety disorder, unspecified: Secondary | ICD-10-CM | POA: Diagnosis present

## 2023-10-07 DIAGNOSIS — E785 Hyperlipidemia, unspecified: Secondary | ICD-10-CM | POA: Diagnosis not present

## 2023-10-07 DIAGNOSIS — R0603 Acute respiratory distress: Secondary | ICD-10-CM | POA: Diagnosis not present

## 2023-10-07 DIAGNOSIS — I110 Hypertensive heart disease with heart failure: Secondary | ICD-10-CM | POA: Diagnosis present

## 2023-10-07 DIAGNOSIS — R0602 Shortness of breath: Secondary | ICD-10-CM | POA: Diagnosis not present

## 2023-10-07 DIAGNOSIS — J8410 Pulmonary fibrosis, unspecified: Secondary | ICD-10-CM | POA: Diagnosis not present

## 2023-10-07 DIAGNOSIS — G4710 Hypersomnia, unspecified: Secondary | ICD-10-CM | POA: Diagnosis present

## 2023-10-07 DIAGNOSIS — R197 Diarrhea, unspecified: Secondary | ICD-10-CM | POA: Diagnosis not present

## 2023-10-07 DIAGNOSIS — T402X5A Adverse effect of other opioids, initial encounter: Secondary | ICD-10-CM | POA: Diagnosis not present

## 2023-10-07 DIAGNOSIS — A419 Sepsis, unspecified organism: Secondary | ICD-10-CM | POA: Diagnosis not present

## 2023-10-07 DIAGNOSIS — Z79899 Other long term (current) drug therapy: Secondary | ICD-10-CM

## 2023-10-07 DIAGNOSIS — I5032 Chronic diastolic (congestive) heart failure: Secondary | ICD-10-CM | POA: Diagnosis present

## 2023-10-07 DIAGNOSIS — J441 Chronic obstructive pulmonary disease with (acute) exacerbation: Secondary | ICD-10-CM | POA: Diagnosis present

## 2023-10-07 DIAGNOSIS — Z9981 Dependence on supplemental oxygen: Secondary | ICD-10-CM

## 2023-10-07 DIAGNOSIS — R092 Respiratory arrest: Secondary | ICD-10-CM | POA: Diagnosis not present

## 2023-10-07 DIAGNOSIS — Z821 Family history of blindness and visual loss: Secondary | ICD-10-CM

## 2023-10-07 DIAGNOSIS — F17200 Nicotine dependence, unspecified, uncomplicated: Secondary | ICD-10-CM | POA: Diagnosis present

## 2023-10-07 DIAGNOSIS — Z1152 Encounter for screening for COVID-19: Secondary | ICD-10-CM

## 2023-10-07 DIAGNOSIS — J9601 Acute respiratory failure with hypoxia: Secondary | ICD-10-CM | POA: Diagnosis present

## 2023-10-07 DIAGNOSIS — I5033 Acute on chronic diastolic (congestive) heart failure: Secondary | ICD-10-CM | POA: Diagnosis present

## 2023-10-07 DIAGNOSIS — K5903 Drug induced constipation: Secondary | ICD-10-CM | POA: Diagnosis present

## 2023-10-07 DIAGNOSIS — Z820 Family history of epilepsy and other diseases of the nervous system: Secondary | ICD-10-CM

## 2023-10-07 DIAGNOSIS — E878 Other disorders of electrolyte and fluid balance, not elsewhere classified: Secondary | ICD-10-CM | POA: Diagnosis not present

## 2023-10-07 DIAGNOSIS — J9811 Atelectasis: Secondary | ICD-10-CM | POA: Diagnosis not present

## 2023-10-07 DIAGNOSIS — J189 Pneumonia, unspecified organism: Secondary | ICD-10-CM

## 2023-10-07 DIAGNOSIS — Z7951 Long term (current) use of inhaled steroids: Secondary | ICD-10-CM

## 2023-10-07 DIAGNOSIS — Z7982 Long term (current) use of aspirin: Secondary | ICD-10-CM

## 2023-10-07 DIAGNOSIS — R0689 Other abnormalities of breathing: Secondary | ICD-10-CM | POA: Diagnosis not present

## 2023-10-07 DIAGNOSIS — R0902 Hypoxemia: Secondary | ICD-10-CM | POA: Diagnosis not present

## 2023-10-07 LAB — CBC
HCT: 34.8 % — ABNORMAL LOW (ref 36.0–46.0)
Hemoglobin: 10.1 g/dL — ABNORMAL LOW (ref 12.0–15.0)
MCH: 28.8 pg (ref 26.0–34.0)
MCHC: 29 g/dL — ABNORMAL LOW (ref 30.0–36.0)
MCV: 99.1 fL (ref 80.0–100.0)
Platelets: 181 10*3/uL (ref 150–400)
RBC: 3.51 MIL/uL — ABNORMAL LOW (ref 3.87–5.11)
RDW: 13.1 % (ref 11.5–15.5)
WBC: 12.3 10*3/uL — ABNORMAL HIGH (ref 4.0–10.5)
nRBC: 0.2 % (ref 0.0–0.2)

## 2023-10-07 LAB — MRSA NEXT GEN BY PCR, NASAL: MRSA by PCR Next Gen: NOT DETECTED

## 2023-10-07 LAB — STREP PNEUMONIAE URINARY ANTIGEN: Strep Pneumo Urinary Antigen: NEGATIVE

## 2023-10-07 LAB — CBC WITH DIFFERENTIAL/PLATELET
Abs Immature Granulocytes: 1.23 10*3/uL — ABNORMAL HIGH (ref 0.00–0.07)
Basophils Absolute: 0.1 10*3/uL (ref 0.0–0.1)
Basophils Relative: 0 %
Eosinophils Absolute: 0 10*3/uL (ref 0.0–0.5)
Eosinophils Relative: 0 %
HCT: 38.8 % (ref 36.0–46.0)
Hemoglobin: 11.2 g/dL — ABNORMAL LOW (ref 12.0–15.0)
Immature Granulocytes: 5 %
Lymphocytes Relative: 7 %
Lymphs Abs: 1.7 10*3/uL (ref 0.7–4.0)
MCH: 28.9 pg (ref 26.0–34.0)
MCHC: 28.9 g/dL — ABNORMAL LOW (ref 30.0–36.0)
MCV: 100 fL (ref 80.0–100.0)
Monocytes Absolute: 1.7 10*3/uL — ABNORMAL HIGH (ref 0.1–1.0)
Monocytes Relative: 8 %
Neutro Abs: 18.5 10*3/uL — ABNORMAL HIGH (ref 1.7–7.7)
Neutrophils Relative %: 80 %
Platelets: 277 10*3/uL (ref 150–400)
RBC: 3.88 MIL/uL (ref 3.87–5.11)
RDW: 13.2 % (ref 11.5–15.5)
WBC: 23.2 10*3/uL — ABNORMAL HIGH (ref 4.0–10.5)
nRBC: 0.2 % (ref 0.0–0.2)

## 2023-10-07 LAB — COMPREHENSIVE METABOLIC PANEL
ALT: 20 U/L (ref 0–44)
AST: 24 U/L (ref 15–41)
Albumin: 4 g/dL (ref 3.5–5.0)
Alkaline Phosphatase: 88 U/L (ref 38–126)
Anion gap: 16 — ABNORMAL HIGH (ref 5–15)
BUN: 14 mg/dL (ref 8–23)
CO2: 40 mmol/L — ABNORMAL HIGH (ref 22–32)
Calcium: 8.9 mg/dL (ref 8.9–10.3)
Chloride: 76 mmol/L — ABNORMAL LOW (ref 98–111)
Creatinine, Ser: 0.61 mg/dL (ref 0.44–1.00)
GFR, Estimated: >60 mL/min (ref >60)
Glucose, Bld: 212 mg/dL — ABNORMAL HIGH (ref 70–99)
Potassium: 4 mmol/L (ref 3.5–5.1)
Sodium: 132 mmol/L — ABNORMAL LOW (ref 135–145)
Total Bilirubin: 0.6 mg/dL (ref 0.3–1.2)
Total Protein: 7.5 g/dL (ref 6.5–8.1)

## 2023-10-07 LAB — I-STAT VENOUS BLOOD GAS, ED
Acid-Base Excess: 16 mmol/L — ABNORMAL HIGH (ref 0.0–2.0)
Bicarbonate: 45.3 mmol/L — ABNORMAL HIGH (ref 20.0–28.0)
Calcium, Ion: 0.96 mmol/L — ABNORMAL LOW (ref 1.15–1.40)
HCT: 41 % (ref 36.0–46.0)
Hemoglobin: 13.9 g/dL (ref 12.0–15.0)
O2 Saturation: 94 %
Potassium: 3.8 mmol/L (ref 3.5–5.1)
Sodium: 126 mmol/L — ABNORMAL LOW (ref 135–145)
TCO2: 48 mmol/L — ABNORMAL HIGH (ref 22–32)
pCO2, Ven: 75.4 mm[Hg] (ref 44–60)
pH, Ven: 7.387 (ref 7.25–7.43)
pO2, Ven: 76 mm[Hg] — ABNORMAL HIGH (ref 32–45)

## 2023-10-07 LAB — BLOOD GAS, ARTERIAL
Acid-Base Excess: 22.4 mmol/L — ABNORMAL HIGH (ref 0.0–2.0)
Bicarbonate: 57.6 mmol/L — ABNORMAL HIGH (ref 20.0–28.0)
O2 Saturation: 99 %
Patient temperature: 36.4
pCO2 arterial: >123 mm[Hg] (ref 32–48)
pH, Arterial: 7.21 — ABNORMAL LOW (ref 7.35–7.45)
pO2, Arterial: 167 mm[Hg] — ABNORMAL HIGH (ref 83–108)

## 2023-10-07 LAB — RESP PANEL BY RT-PCR (RSV, FLU A&B, COVID)  RVPGX2
Influenza A by PCR: NEGATIVE
Influenza B by PCR: NEGATIVE
Resp Syncytial Virus by PCR: NEGATIVE
SARS Coronavirus 2 by RT PCR: NEGATIVE

## 2023-10-07 LAB — POCT I-STAT 7, (LYTES, BLD GAS, ICA,H+H)
Acid-Base Excess: 18 mmol/L — ABNORMAL HIGH (ref 0.0–2.0)
Bicarbonate: 50.3 mmol/L — ABNORMAL HIGH (ref 20.0–28.0)
Calcium, Ion: 1.08 mmol/L — ABNORMAL LOW (ref 1.15–1.40)
HCT: 36 % (ref 36.0–46.0)
Hemoglobin: 12.2 g/dL (ref 12.0–15.0)
O2 Saturation: 74 %
Potassium: 3.9 mmol/L (ref 3.5–5.1)
Sodium: 126 mmol/L — ABNORMAL LOW (ref 135–145)
TCO2: >50 mmol/L — ABNORMAL HIGH (ref 22–32)
pCO2 arterial: 109.2 mm[Hg] (ref 32–48)
pH, Arterial: 7.271 — ABNORMAL LOW (ref 7.35–7.45)
pO2, Arterial: 49 mm[Hg] — ABNORMAL LOW (ref 83–108)

## 2023-10-07 LAB — BLOOD GAS, VENOUS
Acid-Base Excess: 21.5 mmol/L — ABNORMAL HIGH (ref 0.0–2.0)
Bicarbonate: 55.7 mmol/L — ABNORMAL HIGH (ref 20.0–28.0)
O2 Saturation: 71 %
Patient temperature: 36.6
pCO2, Ven: >123 mm[Hg] (ref 44–60)
pH, Ven: 7.25 (ref 7.25–7.43)
pO2, Ven: 41 mm[Hg] (ref 32–45)

## 2023-10-07 LAB — CREATININE, SERUM
Creatinine, Ser: 0.62 mg/dL (ref 0.44–1.00)
GFR, Estimated: >60 mL/min (ref >60)

## 2023-10-07 LAB — BRAIN NATRIURETIC PEPTIDE: B Natriuretic Peptide: 895.2 pg/mL — ABNORMAL HIGH (ref 0.0–100.0)

## 2023-10-07 MED ORDER — ALBUTEROL SULFATE (2.5 MG/3ML) 0.083% IN NEBU
INHALATION_SOLUTION | RESPIRATORY_TRACT | Status: AC
  Administered 2023-10-07: 10 mg
  Filled 2023-10-07: qty 12

## 2023-10-07 MED ORDER — HYDROCODONE-ACETAMINOPHEN 10-325 MG PO TABS
1.0000 | ORAL_TABLET | ORAL | Status: DC | PRN
  Administered 2023-10-07 – 2023-10-13 (×11): 1 via ORAL
  Filled 2023-10-07 (×11): qty 1

## 2023-10-07 MED ORDER — ALBUTEROL SULFATE (2.5 MG/3ML) 0.083% IN NEBU
2.5000 mg | INHALATION_SOLUTION | Freq: Four times a day (QID) | RESPIRATORY_TRACT | Status: DC
  Administered 2023-10-07 – 2023-10-09 (×6): 2.5 mg via RESPIRATORY_TRACT
  Filled 2023-10-07 (×6): qty 3

## 2023-10-07 MED ORDER — DEXMEDETOMIDINE HCL IN NACL 400 MCG/100ML IV SOLN: 0.0000 ug/kg/h | INTRAVENOUS | Status: DC

## 2023-10-07 MED ORDER — METHYLPREDNISOLONE SODIUM SUCC 40 MG IJ SOLR
40.0000 mg | Freq: Three times a day (TID) | INTRAMUSCULAR | Status: DC
  Administered 2023-10-07 – 2023-10-08 (×3): 40 mg via INTRAVENOUS
  Filled 2023-10-07 (×3): qty 1

## 2023-10-07 MED ORDER — FUROSEMIDE 10 MG/ML IJ SOLN
40.0000 mg | Freq: Once | INTRAMUSCULAR | Status: AC
  Administered 2023-10-07: 40 mg via INTRAVENOUS
  Filled 2023-10-07: qty 4

## 2023-10-07 MED ORDER — BETHANECHOL CHLORIDE 10 MG PO TABS
20.0000 mg | ORAL_TABLET | Freq: Every day | ORAL | Status: DC
  Administered 2023-10-07 – 2023-10-17 (×9): 20 mg via ORAL
  Filled 2023-10-07 (×10): qty 2

## 2023-10-07 MED ORDER — FENTANYL CITRATE PF 50 MCG/ML IJ SOSY
50.0000 ug | PREFILLED_SYRINGE | INTRAMUSCULAR | Status: DC | PRN
  Administered 2023-10-08: 25 ug via INTRAVENOUS

## 2023-10-07 MED ORDER — ENOXAPARIN SODIUM 40 MG/0.4ML IJ SOSY
40.0000 mg | PREFILLED_SYRINGE | INTRAMUSCULAR | Status: DC
  Administered 2023-10-07 – 2023-10-11 (×5): 40 mg via SUBCUTANEOUS
  Filled 2023-10-07 (×5): qty 0.4

## 2023-10-07 MED ORDER — VANCOMYCIN HCL 1750 MG/350ML IV SOLN
1750.0000 mg | Freq: Once | INTRAVENOUS | Status: AC
  Administered 2023-10-07: 1750 mg via INTRAVENOUS
  Filled 2023-10-07: qty 350

## 2023-10-07 MED ORDER — LOSARTAN POTASSIUM 50 MG PO TABS
50.0000 mg | ORAL_TABLET | Freq: Every day | ORAL | Status: DC
  Administered 2023-10-07 – 2023-10-10 (×2): 50 mg via ORAL
  Filled 2023-10-07 (×2): qty 1

## 2023-10-07 MED ORDER — FENTANYL CITRATE PF 50 MCG/ML IJ SOSY
25.0000 ug | PREFILLED_SYRINGE | Freq: Once | INTRAMUSCULAR | Status: DC
  Filled 2023-10-07: qty 1

## 2023-10-07 MED ORDER — ARFORMOTEROL TARTRATE 15 MCG/2ML IN NEBU
15.0000 ug | INHALATION_SOLUTION | Freq: Two times a day (BID) | RESPIRATORY_TRACT | Status: DC
  Administered 2023-10-07 – 2023-10-17 (×19): 15 ug via RESPIRATORY_TRACT
  Filled 2023-10-07 (×20): qty 2

## 2023-10-07 MED ORDER — SODIUM CHLORIDE 0.9 % IV SOLN
2.0000 g | Freq: Once | INTRAVENOUS | Status: AC
  Administered 2023-10-07: 2 g via INTRAVENOUS
  Filled 2023-10-07: qty 12.5

## 2023-10-07 MED ORDER — VANCOMYCIN HCL IN DEXTROSE 1-5 GM/200ML-% IV SOLN: 1000.0000 mg | INTRAVENOUS | Status: DC

## 2023-10-07 MED ORDER — NITROGLYCERIN 2 % TD OINT
0.5000 [in_us] | TOPICAL_OINTMENT | Freq: Once | TRANSDERMAL | Status: AC
  Administered 2023-10-07: 0.5 [in_us] via TOPICAL

## 2023-10-07 MED ORDER — ESCITALOPRAM OXALATE 10 MG PO TABS
5.0000 mg | ORAL_TABLET | Freq: Every day | ORAL | Status: DC
  Administered 2023-10-07 – 2023-10-17 (×9): 5 mg via ORAL
  Filled 2023-10-07 (×9): qty 1

## 2023-10-07 MED ORDER — ALBUTEROL SULFATE (2.5 MG/3ML) 0.083% IN NEBU
15.0000 mg | INHALATION_SOLUTION | Freq: Once | RESPIRATORY_TRACT | Status: AC
  Administered 2023-10-07: 15 mg via RESPIRATORY_TRACT
  Filled 2023-10-07: qty 18

## 2023-10-07 MED ORDER — DEXMEDETOMIDINE HCL IN NACL 400 MCG/100ML IV SOLN
0.0000 ug/kg/h | INTRAVENOUS | Status: DC
  Administered 2023-10-07 (×2): 0.2 ug/kg/h via INTRAVENOUS
  Administered 2023-10-08: 1.2 ug/kg/h via INTRAVENOUS
  Administered 2023-10-08 (×2): 1.7 ug/kg/h via INTRAVENOUS
  Administered 2023-10-08: 1.2 ug/kg/h via INTRAVENOUS
  Administered 2023-10-08 – 2023-10-09 (×2): 1.7 ug/kg/h via INTRAVENOUS
  Administered 2023-10-09: 0.7 ug/kg/h via INTRAVENOUS
  Administered 2023-10-09: 1.2 ug/kg/h via INTRAVENOUS
  Administered 2023-10-09: 1.7 ug/kg/h via INTRAVENOUS
  Administered 2023-10-09: 1 ug/kg/h via INTRAVENOUS
  Administered 2023-10-09: 1.7 ug/kg/h via INTRAVENOUS
  Administered 2023-10-10 (×2): 1.2 ug/kg/h via INTRAVENOUS
  Filled 2023-10-07 (×15): qty 100

## 2023-10-07 MED ORDER — ATORVASTATIN CALCIUM 80 MG PO TABS
80.0000 mg | ORAL_TABLET | Freq: Every day | ORAL | Status: DC
  Administered 2023-10-07 – 2023-10-17 (×9): 80 mg via ORAL
  Filled 2023-10-07: qty 2
  Filled 2023-10-07 (×8): qty 1

## 2023-10-07 MED ORDER — MAGNESIUM SULFATE 2 GM/50ML IV SOLN
2.0000 g | Freq: Once | INTRAVENOUS | Status: AC
  Administered 2023-10-07: 2 g via INTRAVENOUS
  Filled 2023-10-07: qty 50

## 2023-10-07 MED ORDER — ALBUTEROL SULFATE (2.5 MG/3ML) 0.083% IN NEBU
2.5000 mg | INHALATION_SOLUTION | RESPIRATORY_TRACT | Status: DC | PRN
  Administered 2023-10-07 – 2023-10-15 (×3): 2.5 mg via RESPIRATORY_TRACT
  Filled 2023-10-07 (×4): qty 3

## 2023-10-07 MED ORDER — REVEFENACIN 175 MCG/3ML IN SOLN
175.0000 ug | Freq: Every day | RESPIRATORY_TRACT | Status: DC
  Administered 2023-10-08 – 2023-10-17 (×9): 175 ug via RESPIRATORY_TRACT
  Filled 2023-10-07 (×11): qty 3

## 2023-10-07 MED ORDER — FENTANYL CITRATE PF 50 MCG/ML IJ SOSY
25.0000 ug | PREFILLED_SYRINGE | Freq: Once | INTRAMUSCULAR | Status: AC
  Administered 2023-10-07: 25 ug via INTRAVENOUS
  Filled 2023-10-07: qty 1

## 2023-10-07 MED ORDER — SODIUM CHLORIDE 0.9 % IV SOLN
2.0000 g | Freq: Three times a day (TID) | INTRAVENOUS | Status: AC
  Administered 2023-10-07 – 2023-10-11 (×13): 2 g via INTRAVENOUS
  Filled 2023-10-07 (×13): qty 12.5

## 2023-10-07 MED ORDER — DOCUSATE SODIUM 100 MG PO CAPS: 100.0000 mg | ORAL_CAPSULE | Freq: Two times a day (BID) | ORAL | Status: DC | PRN

## 2023-10-07 MED ORDER — POLYETHYLENE GLYCOL 3350 17 G PO PACK: 17.0000 g | PACK | Freq: Every day | ORAL | Status: DC | PRN

## 2023-10-07 MED ORDER — CHLORHEXIDINE GLUCONATE CLOTH 2 % EX PADS
6.0000 | MEDICATED_PAD | Freq: Every day | CUTANEOUS | Status: DC
  Administered 2023-10-08 – 2023-10-09 (×2): 6 via TOPICAL

## 2023-10-07 MED ORDER — PANTOPRAZOLE SODIUM 20 MG PO TBEC
20.0000 mg | DELAYED_RELEASE_TABLET | Freq: Every day | ORAL | Status: DC
  Administered 2023-10-07 – 2023-10-17 (×8): 20 mg via ORAL
  Filled 2023-10-07 (×11): qty 1

## 2023-10-07 NOTE — Progress Notes (Addendum)
eLink Physician-Brief Progress Note Patient Name: MAYLIE ASHTON DOB: [birth year] MRN: [id number]   Date of Service  10/07/2023  HPI/Events of Note  72 year old female with a history of COPD in ICU with acute hypoxemic and hypercapnic respiratory failure.  She is encephalopathic.  ABG shows 7.2 71/109/49/50 with O2 sats of 74%  eICU Interventions  Patient's chart reviewed.  Pertinent labs and imaging studies reviewed.  Video assessment done.   Impression: Acute hypoxemic and hypercapnic respiratory failure from COPD exacerbation. Contraction metabolic alkalosis from furosemide use. Acute metabolic encephalopathy from CO2 narcosis Place patient back on BiPAP at 12/5 with rate of 16.  With that, she is generating tidal volumes over 700 cc.  Obtain a blood gas in 1 hour.      Intervention Category Major Interventions: Hypercarbia - evaluation and management;Hypoxemia - evaluation and management;Change in mental status - evaluation and management  Carilyn Goodpasture 10/07/2023, 9:03 PM  Addendum: Patient developed worsening bronchospasm with poor air movement.  Tidal volumes dropped to the low 200s.  ABG after 1 hour shows a worsening respiratory acidosis. Will give her 2 g of IV magnesium and an hour-long bronchodilator treatment.  She is already getting IV steroids.  If she does not improve after this intervention, she will be endotracheally intubated and placed on invasive mechanical ventilation. Discussed with respiratory therapist.  Carilyn Goodpasture 10/07/2023, 11:24 PM  Addendum: Precedex keeps dropping her blood pressure to MAP's in the 50s.  Will start a phenylephrine infusion to support blood pressure while keeping patient sedated and calm on dexmedetomidine. Bedside nurse informed.  Carilyn Goodpasture 10/07/2023, 12:51 AM

## 2023-10-07 NOTE — Progress Notes (Signed)
ED Pharmacy Antibiotic Sign Off An antibiotic consult was received from an ED provider for vancomycin per pharmacy dosing for pneumonia. A chart review was completed to assess appropriateness.  A single dose of cefepime placed by the ED provider.   The following one time order(s) were placed per pharmacy consult:  vancomycin 1750 mg x 1 dose MRSA PCR  Further antibiotic and/or antibiotic pharmacy consults should be ordered by the admitting provider if indicated.   Thank you for allowing pharmacy to be a part of this patient's care.   Delmar Landau, PharmD, BCPS 10/07/2023 12:45 PM ED Clinical Pharmacist -  [phone]

## 2023-10-07 NOTE — Plan of Care (Signed)
  Problem: Education: Goal: Knowledge of General Education information will improve Description: Including pain rating scale, medication(s)/side effects and non-pharmacologic comfort measures Outcome: Not Progressing   Problem: Health Behavior/Discharge Planning: Goal: Ability to manage health-related needs will improve Outcome: Not Progressing   Problem: Clinical Measurements: Goal: Ability to maintain clinical measurements within normal limits will improve Outcome: Not Progressing Goal: Will remain free from infection Outcome: Not Progressing Goal: Diagnostic test results will improve Outcome: Not Progressing Goal: Respiratory complications will improve Outcome: Not Progressing Goal: Cardiovascular complication will be avoided Outcome: Not Progressing   Problem: Activity: Goal: Risk for activity intolerance will decrease Outcome: Not Progressing   Problem: Nutrition: Goal: Adequate nutrition will be maintained Outcome: Not Progressing   Problem: Coping: Goal: Level of anxiety will decrease Outcome: Not Progressing   Problem: Elimination: Goal: Will not experience complications related to bowel motility Outcome: Not Progressing Goal: Will not experience complications related to urinary retention Outcome: Not Progressing   Problem: Pain Managment: Goal: General experience of comfort will improve Outcome: Not Progressing   Problem: Safety: Goal: Ability to remain free from injury will improve Outcome: Not Progressing   Problem: Skin Integrity: Goal: Risk for impaired skin integrity will decrease Outcome: Not Progressing   Problem: Activity: Goal: Ability to tolerate increased activity will improve Outcome: Not Progressing   Problem: Clinical Measurements: Goal: Ability to maintain a body temperature in the normal range will improve Outcome: Not Progressing   Problem: Respiratory: Goal: Ability to maintain adequate ventilation will improve Outcome: Not  Progressing Goal: Ability to maintain a clear airway will improve Outcome: Not Progressing   Problem: Education: Goal: Knowledge of disease or condition will improve Outcome: Not Progressing Goal: Knowledge of the prescribed therapeutic regimen will improve Outcome: Not Progressing Goal: Individualized Educational Video(s) Outcome: Not Progressing   Problem: Activity: Goal: Ability to tolerate increased activity will improve Outcome: Not Progressing Goal: Will verbalize the importance of balancing activity with adequate rest periods Outcome: Not Progressing   Problem: Respiratory: Goal: Ability to maintain a clear airway will improve Outcome: Not Progressing Goal: Levels of oxygenation will improve Outcome: Not Progressing Goal: Ability to maintain adequate ventilation will improve Outcome: Not Progressing

## 2023-10-07 NOTE — ED Notes (Signed)
Pt continues to remove O2 tubing stating that it is too hot.  RT and MD notified and at bedside.

## 2023-10-07 NOTE — ED Triage Notes (Signed)
Per [facility] pt arriving on C-pap from home. Initial oxygen saturation less than 50% on her baseline 2L [place]. Given 0.3 epi, 2 G magnesium, 0.5 atrovent  5 mg albuterol and 125 mg solumedrol.

## 2023-10-07 NOTE — ED Notes (Signed)
Attempted report

## 2023-10-07 NOTE — ED Provider Notes (Signed)
[name] EM[facility] AT [facility] Provider Note   CSN: [id number] Arrival date & time: 10/07/23  1133     History  Chief Complaint  Patient presents with   Respiratory Distress    [name] is a 72 y.o. female.  HPI Patient presents via EMS in respiratory distress level 5 caveat.  Per EMS another individual in the patient's house called for assistance due to increased work of breathing.  EMS reports that on my arrival the patient's oxygen saturation was less than 50% on her typical 2 L she reportedly wears 24/7. In transport the patient received Solu-Medrol, Atrovent x 2, magnesium, CPAP had improvement of saturation into the 80% range.    Home Medications Prior to Admission medications   Medication Sig Start Date End Date Taking? Authorizing Provider  albuterol (PROVENTIL) (2.5 MG/3ML) 0.083% nebulizer solution Take 2.5 mg by nebulization every 4 (four) hours as needed for wheezing or shortness of breath.    [provider]  albuterol (VENTOLIN HFA) 108 (90 Base) MCG/ACT inhaler Inhale 2 puffs into the lungs every 4 (four) hours as needed for wheezing or shortness of breath. For shortness of breath 08/14/23   Nyoka Cowden, MD  ALPRAZolam Prudy Feeler) 0.25 MG tablet Take 0.25 mg by mouth 2 (two) times daily as needed for anxiety.    [provider]  aspirin EC 81 MG tablet Take 81 mg by mouth in the morning. Swallow whole.    [provider]  atorvastatin (LIPITOR) 80 MG tablet Take 1 tablet (80 mg total) by mouth daily. 07/07/22   Almon Hercules, MD  bethanechol (URECHOLINE) 10 MG tablet Take 1 tablet (10 mg total) by mouth in the morning and at bedtime. 06/08/23   McKenzie, Mardene Celeste, MD  ciprofloxacin (CIPRO) 500 MG tablet Take 1 tablet (500 mg total) by mouth 2 (two) times daily. 10/06/23   Nyoka Cowden, MD  diltiazem (CARDIZEM CD) 180 MG 24 hr capsule Take 1 capsule (180 mg total) by mouth daily. 07/08/22   Almon Hercules, MD   escitalopram (LEXAPRO) 5 MG tablet Take 5 mg by mouth daily.    [provider]  furosemide (LASIX) 20 MG tablet Take 1 tablet (20 mg total) by mouth daily. Patient taking differently: Take 20 mg by mouth daily as needed for fluid or edema. 07/08/22   Almon Hercules, MD  HYDROcodone-acetaminophen (NORCO) 10-325 MG tablet Take 1 tablet by mouth 4 (four) times daily as needed for moderate pain. 07/14/22   [provider]  losartan (COZAAR) 50 MG tablet Take 50 mg by mouth daily. 07/22/22   [provider]  mirtazapine (REMERON) 7.5 MG tablet Take 7.5 mg by mouth at bedtime as needed (Sleep). 02/09/23   [provider]  Multiple Vitamin (MULTIVITAMIN WITH MINERALS) TABS tablet Take 1 tablet by mouth in the morning.    [provider]  pantoprazole (PROTONIX) 20 MG tablet Take 1 tablet (20 mg total) by mouth daily. 07/08/22   Almon Hercules, MD  predniSONE (DELTASONE) 10 MG tablet Take  4 each am x 2 days,   2 each am x 2 days,  1 each am x 2 days and stop 10/06/23   Nyoka Cowden, MD  tamsulosin (FLOMAX) 0.4 MG CAPS capsule Take 1 capsule (0.4 mg total) by mouth daily after supper. 01/17/23   McKenzie, Mardene Celeste, MD  TRELEGY ELLIPTA 100-62.5-25 MCG/INH AEPB Take 1 puff by mouth in the morning. 02/19/21  [provider]      Allergies    Gabapentin    Review of Systems   Review of Systems  Unable to perform ROS: Acuity of condition    Physical Exam Updated Vital Signs BP (!) 163/64   Pulse 76   Temp 97.6 F (36.4 C) (Axillary)   Resp (!) 23   Ht 5\' 3"  (1.6 m)   Wt 83.9 kg   SpO2 94%   BMI 32.77 kg/m  Physical Exam Vitals and nursing note reviewed.  Constitutional:      General: She is in acute distress.     Appearance: She is well-developed. She is ill-appearing, toxic-appearing and diaphoretic.  HENT:     Head: Normocephalic and atraumatic.  Eyes:     Conjunctiva/sclera: Conjunctivae normal.  Cardiovascular:     Rate and  Rhythm: Regular rhythm. Tachycardia present.  Pulmonary:     Effort: Respiratory distress present.     Breath sounds: Normal breath sounds. No stridor.  Abdominal:     General: There is no distension.  Skin:    General: Skin is warm.  Neurological:     Mental Status: She is alert and oriented to person, place, and time.     Cranial Nerves: No cranial nerve deficit.  Psychiatric:        Mood and Affect: Mood normal.     ED Results / Procedures / Treatments   Labs (all labs ordered are listed, but only abnormal results are displayed) Labs Reviewed  COMPREHENSIVE METABOLIC PANEL - Abnormal; Notable for the following components:      Result Value   Sodium 132 (*)    Chloride 76 (*)    CO2 40 (*)    Glucose, Bld 212 (*)    Anion gap 16 (*)    All other components within normal limits  CBC WITH DIFFERENTIAL/PLATELET - Abnormal; Notable for the following components:   WBC 23.2 (*)    Hemoglobin 11.2 (*)    MCHC 28.9 (*)    Neutro Abs 18.5 (*)    Monocytes Absolute 1.7 (*)    Abs Immature Granulocytes 1.23 (*)    All other components within normal limits  BRAIN NATRIURETIC PEPTIDE - Abnormal; Notable for the following components:   B Natriuretic Peptide 895.2 (*)    All other components within normal limits  I-STAT VENOUS BLOOD GAS, ED - Abnormal; Notable for the following components:   pCO2, Ven 75.4 (*)    pO2, Ven 76 (*)    Bicarbonate 45.3 (*)    TCO2 48 (*)    Acid-Base Excess 16.0 (*)    Sodium 126 (*)    Calcium, Ion 0.96 (*)    All other components within normal limits  RESP PANEL BY RT-PCR (RSV, FLU A&B, COVID)  RVPGX2  MRSA NEXT GEN BY PCR, NASAL  I-STAT ARTERIAL BLOOD GAS, ED    EKG EKG Interpretation Date/Time:  Saturday October 07 2023 11:42:38 EDT Ventricular Rate:  81 PR Interval:  132 QRS Duration:  87 QT Interval:  393 QTC Calculation: 457 R Axis:   55  Text Interpretation: Sinus rhythm Probable LVH with secondary repol abnrm ST-t wave  abnormality Artifact Confirmed by Gerhard Munch [phone]) on 10/07/2023 12:02:53 PM  Radiology DG Chest Portable 1 View  Result Date: 10/07/2023 CLINICAL DATA:  Respiratory distress EXAM: PORTABLE CHEST 1 VIEW COMPARISON:  Chest radiograph dated 07/19/2023. FINDINGS: The heart is enlarged. Vascular calcifications are seen in the aortic arch. There are small bilateral  pleural effusions with associated atelectasis/airspace disease. No pneumothorax. Degenerative changes are seen in the spine. IMPRESSION: Small bilateral pleural effusions with associated atelectasis/airspace disease. Electronically Signed   By: Romona Curls M.D.   On: 10/07/2023 12:23    Procedures Procedures    Medications Ordered in ED Medications  vancomycin (VANCOREADY) IVPB 1750 mg/350 mL (has no administration in time range)  albuterol (PROVENTIL) (2.5 MG/3ML) 0.083% nebulizer solution (10 mg  Given 10/07/23 1152)  albuterol (PROVENTIL) (2.5 MG/3ML) 0.083% nebulizer solution (10 mg  Given 10/07/23 1205)  nitroGLYCERIN (NITROGLYN) 2 % ointment 0.5 inch (0.5 inches Topical Given 10/07/23 1206)  ceFEPIme (MAXIPIME) 2 g in sodium chloride 0.9 % 100 mL IVPB (2 g Intravenous New Bag/Given 10/07/23 1308)  furosemide (LASIX) injection 40 mg (40 mg Intravenous Given 10/07/23 1302)    ED Course/ Medical Decision Making/ A&P                                 Medical Decision Making Elderly female with COPD presents with hypersomnolence, hypoxia, concern for COPD exacerbation versus pneumonia, bacteremia, sepsis or other acute phenomenon. Initial vitals notable for tachycardia, heart rate 105 sinus abnormal Patient was hypoxic without any supplemental oxygen requiring BiPAP for saturation of 95% abnormal   Amount and/or Complexity of Data Reviewed Independent Historian: EMS External Data Reviewed: notes.    Details: Pulm note from yesterday reviewed Labs: ordered. Decision-making details documented in ED Course. Radiology:  ordered and independent interpretation performed. Decision-making details documented in ED Course. ECG/medicine tests: ordered and independent interpretation performed. Decision-making details documented in ED Course.  Risk Prescription drug management. Diagnosis or treatment significantly limited by social determinants of health.  Update: Now on BiPAP, first reeval patient with saturation 100%, hypersomnolent, awakens with sternal rub.  Update:, Patient now with less stimuli required, but still hypersomnolent.  Update: Repeat blood gas substantially more abnormal, though the patient's mentation is substantially better, paradoxically. Patient now switching to high flow nasal cannula, now oriented appropriately, answering questions.     4:19 PM Patient on high flow nasal cannula, requesting to watch westerns on TV.  In the patient's blood gas results are paradoxically substantially worse than her clinical condition.  She is transition from BiPAP, now on high flow nasal cannula.  With abnormal x-ray, delirium, patient received broad-spectrum antibiotics on arrival.  Interventions in terms of COPD exacerbation with steroids, bronchodilators, magnesium more provided by EMS prior to arrival. Given critically abnormal respiratory distress, though she has improved somewhat patient will require admission to the ICU.  No immediate intubation performed, though was considered, due to concern for the patient's COPD, possible difficulty with extubation.          Final Clinical Impression(s) / ED Diagnoses Final diagnoses:  Acute respiratory failure with hypercapnia (HCC)  CRITICAL CARE Performed by: Gerhard Munch Total critical care time: 50 minutes Critical care time was exclusive of separately billable procedures and treating other patients. Critical care was necessary to treat or prevent imminent or life-threatening deterioration. Critical care was time spent personally by me on the  following activities: development of treatment plan with patient and/or surrogate as well as nursing, discussions with consultants, evaluation of patient's response to treatment, examination of patient, obtaining history from patient or surrogate, ordering and performing treatments and interventions, ordering and review of laboratory studies, ordering and review of radiographic studies, pulse oximetry and re-evaluation of patient's condition.    Gerhard Munch,  MD 10/07/23 1622

## 2023-10-07 NOTE — ED Notes (Signed)
Pt yelling out, saying she needs pain meds for her chronic pain meds.  States she takes vicodin 4 times per day, and had not taken them for 2 days because she's been sick.  Given fentanyl.

## 2023-10-07 NOTE — Progress Notes (Addendum)
Pt taken off BiPAP and placed on 3L [place] by RT per ED MD at bedside. Pt tolerating well at this time, SpO2 93%. RN aware, RT will monitor as needed.     10/07/23 1440  Therapy Vitals  Pulse Rate 90  Resp (!) 24  MEWS Score/Color  MEWS Score 1  MEWS Score Color Green  Respiratory Assessment  Assessment Type Assess only  Respiratory Pattern Regular;Unlabored  Chest Assessment Chest expansion symmetrical  Cough Non-productive  Bilateral Breath Sounds Diminished;Fine crackles  Oxygen Therapy/Pulse Ox  O2 Device (S)  Nasal Cannula  O2 Therapy Oxygen  O2 Flow Rate (L/min) 3 L/min  SpO2 93 %

## 2023-10-07 NOTE — H&P (Signed)
NAME:  [name], MRN:  [id number], DOB:  Mar 26, 1951, LOS: 0 ADMISSION DATE:  10/07/2023, CONSULTATION DATE:  10/07/2023 REFERRING MD:  Dr Jeraldine Loots, CHIEF COMPLAINT: Hypoxemic respiratory failure  History of Present Illness:  Activated EMS for not feeling well Has been started on antibiotics and steroids by Dr. Octavio Graves on 10/10 Activated EMS with increased work of breathing, saturations noted to be 50% on her usual 2 L of oxygen was given Breathing treatments and brought to the hospital Started on BiPAP Worsening ABG  Pertinent  Medical History   Past Medical History:  Diagnosis Date   Anxiety    Atrophic vaginitis 10/09/2015   Baker's cyst    left leg   Blood in urine 10/09/2015   CHF (congestive heart failure) (HCC)    COPD (chronic obstructive pulmonary disease) (HCC)    Heart murmur    Hematuria 10/09/2015   Hypertension    Nicotine addiction 04/07/2014   Oxygen dependent    4L/min [place] all the time   Pneumonia    Urinary retention 07/01/2020   Urinary urgency 01/25/2016   Vaginal bleeding 10/09/2015     Significant Hospital Events: Including procedures, antibiotic start and stop dates in addition to other pertinent events   CT scan chest 07/23/2023-pleural calcification, fibrosis, emphysema PFT 06/02/2016 severe obstructive lung disease ABG 10/07/2023 7.21/123/167   Interim History / Subjective:  She is awake alert interactive complaining about the heated humidification she is on at present  Objective   Blood pressure (!) 143/53, pulse 81, temperature 98.4 F (36.9 C), resp. rate (!) 29, height 5\' 3"  (1.6 m), weight 83.9 kg, SpO2 96%.    FiO2 (%):  [30 %-100 %] 30 %   Intake/Output Summary (Last 24 hours) at 10/07/2023 1641 Last data filed at 10/07/2023 1356 Gross per 24 hour  Intake 100.71 ml  Output --  Net 100.71 ml   Filed Weights   10/07/23 1138  Weight: 83.9 kg    Examination: General: Elderly, chronically ill-appearing HENT: Moist oral  mucosa Lungs: Decreased air movement bilaterally Cardiovascular: S1-S2 appreciated Abdomen: Soft, bowel sounds appreciated Extremities: No clubbing, no edema Neuro: Awake alert oriented to person place GU:   Resolved Hospital Problem list     Assessment & Plan:  Acute hypoxemic/hypercapnic respiratory failure acute on chronic respiratory failure -BiPAP -25/5, adjust the patient's comfort -Bronchodilators -Steroids-Solu-Medrol 40 Q8 -Antibiotics -Roxy Manns, albuterol every 6 -  Check venous blood gas in a couple of hours  Obtain urine Legionella and pneumococcal antigen  Possible pneumonia -Continue current antibiotic therapy -Continue cefepime and vancomycin -De-escalate once MRSA PCR resulted  Advanced chronic obstructive pulmonary disease -Continue bronchodilators  History of GERD -Continue PPI  Admit to ICU for close monitoring Reinitiate BiPAP -Consider Precedex for BiPAP tolerance  Best Practice (right click and "Reselect all SmartList Selections" daily)   Diet/type: NPO DVT prophylaxis: LMWH GI prophylaxis: PPI Lines: N/A Foley:  N/A Code Status:  full code Last date of multidisciplinary goals of care discussion [pending]  Labs   CBC: Recent Labs  Lab 10/07/23 1148 10/07/23 1155  WBC 23.2*  --   NEUTROABS 18.5*  --   HGB 11.2* 13.9  HCT 38.8 41.0  MCV 100.0  --   PLT 277  --     Basic Metabolic Panel: Recent Labs  Lab 10/07/23 1148 10/07/23 1155  NA 132* 126*  K 4.0 3.8  CL 76*  --   CO2 40*  --   GLUCOSE 212*  --  BUN 14  --   CREATININE 0.61  --   CALCIUM 8.9  --    GFR: Estimated Creatinine Clearance: 67.1 mL/min (by C-G formula based on SCr of 0.61 mg/dL). Recent Labs  Lab 10/07/23 1148  WBC 23.2*    Liver Function Tests: Recent Labs  Lab 10/07/23 1148  AST 24  ALT 20  ALKPHOS 88  BILITOT 0.6  PROT 7.5  ALBUMIN 4.0   No results for input(s): "LIPASE", "AMYLASE" in the last 168 hours. No results for  input(s): "AMMONIA" in the last 168 hours.  ABG    Component Value Date/Time   PHART 7.21 (L) 10/07/2023 1410   PCO2ART >123 (HH) 10/07/2023 1410   PO2ART 167 (H) 10/07/2023 1410   HCO3 57.6 (H) 10/07/2023 1410   TCO2 48 (H) 10/07/2023 1155   O2SAT 99 10/07/2023 1410     Coagulation Profile: No results for input(s): "INR", "PROTIME" in the last 168 hours.  Cardiac Enzymes: No results for input(s): "CKTOTAL", "CKMB", "CKMBINDEX", "TROPONINI" in the last 168 hours.  HbA1C: Hgb A1c MFr Bld  Date/Time Value Ref Range Status  07/01/2022 02:07 AM 5.4 4.8 - 5.6 % Final    Comment:    (NOTE) Pre diabetes:          5.7%-6.4%  Diabetes:              >6.4%  Glycemic control for   <7.0% adults with diabetes     CBG: No results for input(s): "GLUCAP" in the last 168 hours.  Review of Systems:   Awake alert interactive complain of some pain  Past Medical History:  She,  has a past medical history of Anxiety, Atrophic vaginitis (10/09/2015), Baker's cyst, Blood in urine (10/09/2015), CHF (congestive heart failure) (HCC), COPD (chronic obstructive pulmonary disease) (HCC), Heart murmur, Hematuria (10/09/2015), Hypertension, Nicotine addiction (04/07/2014), Oxygen dependent, Pneumonia, Urinary retention (07/01/2020), Urinary urgency (01/25/2016), and Vaginal bleeding (10/09/2015).   Surgical History:   Past Surgical History:  Procedure Laterality Date   ABDOMINAL HYSTERECTOMY     CATARACT EXTRACTION Left 03/2015   CESAREAN SECTION     COLONOSCOPY  10/19/2004   Dr. Jena Gauss- internal hemorrhoids, o/w normal rectum, normal colon   COLONOSCOPY N/A 07/30/2014   AOZ:HYQMVHQ diverticulosis. Colonic polyp-removed TA. repeat TCS 07/2021   COLONOSCOPY WITH PROPOFOL N/A 03/29/2021   diverticulosis in sigmoid and descending colon, one 5 mm polyp ascending colon (adenoma), non-bleeding internal hemorrhoids. 5 year surveillance   ESOPHAGOGASTRODUODENOSCOPY N/A 07/30/2014   ION:GEXBMW EGD    POLYPECTOMY  03/29/2021   Procedure: POLYPECTOMY;  Surgeon: Corbin Ade, MD;  [facility];  Service: Endoscopy;;   RADIAL HEAD ARTHROPLASTY Left 06/23/2017   Procedure: RADIAL HEAD ARTHROPLASTY WITH LIGAMENT REPAIR;  Surgeon: Cammy Copa, MD;  Location: [facility] OR;  Service: Orthopedics;  Laterality: Left;     Social History:   reports that she quit smoking about 10 years ago. Her smoking use included cigarettes. She started smoking about 50 years ago. She has been exposed to tobacco smoke. She has never used smokeless tobacco. She reports current alcohol use. She reports that she does not use drugs.   Family History:  Her family history includes Blindness in her maternal grandfather; Cancer in her daughter, sister, sister, sister, and sister; Hypertension in her sister; Migraines in her daughter; Other in her brother, brother, sister, and sister. There is no history of Colon cancer. She was adopted.   Allergies Allergies  Allergen Reactions   Neurontin [Gabapentin]  Other (See Comments)    Lightheadedness  Groggy      Home Medications  Prior to Admission medications   Medication Sig Start Date End Date Taking? Authorizing Provider  albuterol (VENTOLIN HFA) 108 (90 Base) MCG/ACT inhaler Inhale 2 puffs into the lungs every 4 (four) hours as needed for wheezing or shortness of breath. For shortness of breath 08/14/23  Yes Nyoka Cowden, MD  ALPRAZolam Prudy Feeler) 0.25 MG tablet Take 0.25 mg by mouth 2 (two) times daily.   Yes [provider]  aspirin EC 81 MG tablet Take 81 mg by mouth daily.   Yes [provider]  atorvastatin (LIPITOR) 80 MG tablet Take 1 tablet (80 mg total) by mouth daily. 07/07/22  Yes Almon Hercules, MD  bethanechol (URECHOLINE) 10 MG tablet Take 1 tablet (10 mg total) by mouth in the morning and at bedtime. Patient taking differently: Take 20 mg by mouth daily. 06/08/23  Yes McKenzie, Mardene Celeste, MD  ciprofloxacin (CIPRO) 500 MG tablet Take  1 tablet (500 mg total) by mouth 2 (two) times daily. 10/06/23  Yes Nyoka Cowden, MD  escitalopram (LEXAPRO) 5 MG tablet Take 5 mg by mouth daily.   Yes [provider]  furosemide (LASIX) 20 MG tablet Take 1 tablet (20 mg total) by mouth daily. Patient taking differently: Take 20 mg by mouth daily as needed for fluid or edema. 07/08/22  Yes Almon Hercules, MD  HYDROcodone-acetaminophen (NORCO) 10-325 MG tablet Take 1 tablet by mouth See admin instructions. 1 tablet every 3-4 hours as needed for pain. 07/14/22  Yes [provider]  losartan (COZAAR) 50 MG tablet Take 50 mg by mouth daily. 07/22/22  Yes [provider]  Multiple Vitamins-Minerals (CENTRUM ADULT PO) Take 1 tablet by mouth daily.   Yes [provider]  pantoprazole (PROTONIX) 20 MG tablet Take 1 tablet (20 mg total) by mouth daily. 07/08/22  Yes Almon Hercules, MD  predniSONE (DELTASONE) 10 MG tablet Take  4 each am x 2 days,   2 each am x 2 days,  1 each am x 2 days and stop 10/06/23  Yes Nyoka Cowden, MD  tamsulosin (FLOMAX) 0.4 MG CAPS capsule Take 1 capsule (0.4 mg total) by mouth daily after supper. Patient taking differently: Take 0.4 mg by mouth daily. 01/17/23  Yes McKenzie, Mardene Celeste, MD    The patient is critically ill with multiple organ systems failure and requires high complexity decision making for assessment and support, frequent evaluation and titration of therapies, application of advanced monitoring technologies and extensive interpretation of multiple databases. Critical Care Time devoted to patient care services described in this note independent of APP/resident time (if applicable)  is 35 minutes.   Virl Diamond MD [facility] Personal pager: See Amion If unanswered, please page CCM On-call: #[phone]

## 2023-10-07 NOTE — Progress Notes (Addendum)
[id number] assigned patient from ER looking up patient now 1543 patient report taken patient to be transferred up to 35m room 12 1814 Patient arrived to unit

## 2023-10-07 NOTE — Progress Notes (Signed)
Pt placed on BiPAP by RT. Pt tolerating well at this time, RN at bedside, ED MD at bedside, RT will monitor as needed.

## 2023-10-07 NOTE — ED Notes (Signed)
ED TO INPATIENT HANDOFF REPORT  ED Nurse Name and Phone #: Trinda Pascal [phone]  S Name/Age/Gender [name] 72 y.o. female Room/Bed: 008C/008C  Code Status   Code Status: Full Code  Home/SNF/Other Home Patient oriented to: self, place, time, and situation Is this baseline? Yes   Triage Complete: Triage complete  Chief Complaint Acute hypoxic on chronic hypercapnic respiratory failure (HCC) [J96.01, J96.12]  Triage Note Per [facility] pt arriving on C-pap from home. Initial oxygen saturation less than 50% on her baseline 2L [place]. Given 0.3 epi, 2 G magnesium, 0.5 atrovent  5 mg albuterol and 125 mg solumedrol.    Allergies Allergies  Allergen Reactions   Neurontin [Gabapentin] Other (See Comments)    Lightheadedness  Groggy     Level of Care/Admitting Diagnosis ED Disposition     ED Disposition  Admit   Condition  --   Comment  Hospital Area: [facility] [[id number]]  Level of Care: ICU [6]  May admit patient to [facility] or [facility] if equivalent level of care is available:: Yes  Covid Evaluation: Confirmed COVID Negative  Diagnosis: Acute hypoxic on chronic hypercapnic respiratory failure [facility]) [[id number]]  Admitting Physician: Tomma Lightning [[id number]]  Attending Physician: Tomma Lightning [[id number]]  Certification:: I certify this patient will need inpatient services for at least 2 midnights  Expected Medical Readiness: 10/09/2023          B Medical/Surgery History Past Medical History:  Diagnosis Date   Anxiety    Atrophic vaginitis 10/09/2015   Baker's cyst    left leg   Blood in urine 10/09/2015   CHF (congestive heart failure) (HCC)    COPD (chronic obstructive pulmonary disease) (HCC)    Heart murmur    Hematuria 10/09/2015   Hypertension    Nicotine addiction 04/07/2014   Oxygen dependent    4L/min [place] all the time   Pneumonia    Urinary retention 07/01/2020   Urinary urgency 01/25/2016   Vaginal bleeding  10/09/2015   Past Surgical History:  Procedure Laterality Date   ABDOMINAL HYSTERECTOMY     CATARACT EXTRACTION Left 03/2015   CESAREAN SECTION     COLONOSCOPY  10/19/2004   Dr. Jena Gauss- internal hemorrhoids, o/w normal rectum, normal colon   COLONOSCOPY N/A 07/30/2014   LKG:MWNUUVO diverticulosis. Colonic polyp-removed TA. repeat TCS 07/2021   COLONOSCOPY WITH PROPOFOL N/A 03/29/2021   diverticulosis in sigmoid and descending colon, one 5 mm polyp ascending colon (adenoma), non-bleeding internal hemorrhoids. 5 year surveillance   ESOPHAGOGASTRODUODENOSCOPY N/A 07/30/2014   ZDG:UYQIHK EGD   POLYPECTOMY  03/29/2021   Procedure: POLYPECTOMY;  Surgeon: Corbin Ade, MD;  [facility];  Service: Endoscopy;;   RADIAL HEAD ARTHROPLASTY Left 06/23/2017   Procedure: RADIAL HEAD ARTHROPLASTY WITH LIGAMENT REPAIR;  Surgeon: Cammy Copa, MD;  Location: [facility] OR;  Service: Orthopedics;  Laterality: Left;     A IV Location/Drains/Wounds Patient Lines/Drains/Airways Status     Active Line/Drains/Airways     Name Placement date Placement time Site Days   Peripheral IV 10/07/23 20 G Anterior;Left;Proximal Forearm 10/07/23  1138  Forearm  less than 1   Peripheral IV 10/07/23 22 G 1" Anterior;Left Forearm 10/07/23  1141  Forearm  less than 1            Intake/Output Last 24 hours  Intake/Output Summary (Last 24 hours) at 10/07/2023 1656 Last data filed at 10/07/2023 1356 Gross per 24 hour  Intake 100.71 ml  Output --  Net 100.71 ml    Labs/Imaging Results for orders placed or performed during the hospital encounter of 10/07/23 (from the past 48 hour(s))  Resp panel by RT-PCR (RSV, Flu A&B, Covid) Anterior Nasal Swab     Status: None   Collection Time: 10/07/23 11:39 AM   Specimen: Anterior Nasal Swab  Result Value Ref Range   SARS Coronavirus 2 by RT PCR NEGATIVE NEGATIVE   Influenza A by PCR NEGATIVE NEGATIVE   Influenza B by PCR NEGATIVE NEGATIVE    Comment: (NOTE) The  Xpert Xpress SARS-CoV-2/FLU/RSV plus assay is intended as an aid in the diagnosis of influenza from Nasopharyngeal swab specimens and should not be used as a sole basis for treatment. Nasal washings and aspirates are unacceptable for Xpert Xpress SARS-CoV-2/FLU/RSV testing.  Fact Sheet for Patients: BloggerCourse.com  Fact Sheet for Healthcare Providers: SeriousBroker.it  This test is not yet approved or cleared by the [place] FDA and has been authorized for detection and/or diagnosis of SARS-CoV-2 by FDA under an Emergency Use Authorization (EUA). This EUA will remain in effect (meaning this test can be used) for the duration of the COVID-19 declaration under Section 564(b)(1) of the Act, 21 U.S.C. section 360bbb-3(b)(1), unless the authorization is terminated or revoked.     Resp Syncytial Virus by PCR NEGATIVE NEGATIVE    Comment: (NOTE) Fact Sheet for Patients: BloggerCourse.com  Fact Sheet for Healthcare Providers: SeriousBroker.it  This test is not yet approved or cleared by the [place] FDA and has been authorized for detection and/or diagnosis of SARS-CoV-2 by FDA under an Emergency Use Authorization (EUA). This EUA will remain in effect (meaning this test can be used) for the duration of the COVID-19 declaration under Section 564(b)(1) of the Act, 21 U.S.C. section 360bbb-3(b)(1), unless the authorization is terminated or revoked.  Performed at [facility], [address] [place], [place] [address]   Comprehensive metabolic panel     Status: Abnormal   Collection Time: 10/07/23 11:48 AM  Result Value Ref Range   Sodium 132 (L) 135 - 145 mmol/L   Potassium 4.0 3.5 - 5.1 mmol/L   Chloride 76 (L) 98 - 111 mmol/L   CO2 40 (H) 22 - 32 mmol/L   Glucose, Bld 212 (H) 70 - 99 mg/dL    Comment: Glucose reference range applies only to samples taken after  fasting for at least 8 hours.   BUN 14 8 - 23 mg/dL   Creatinine, Ser 6.04 0.44 - 1.00 mg/dL   Calcium 8.9 8.9 - 54.0 mg/dL   Total Protein 7.5 6.5 - 8.1 g/dL   Albumin 4.0 3.5 - 5.0 g/dL   AST 24 15 - 41 U/L   ALT 20 0 - 44 U/L   Alkaline Phosphatase 88 38 - 126 U/L   Total Bilirubin 0.6 0.3 - 1.2 mg/dL   GFR, Estimated >98 >11 mL/min    Comment: (NOTE) Calculated using the CKD-EPI Creatinine Equation (2021)    Anion gap 16 (H) 5 - 15    Comment: Performed at [facility], [address], [place], [place] [address]  CBC with Differential/Platelet     Status: Abnormal   Collection Time: 10/07/23 11:48 AM  Result Value Ref Range   WBC 23.2 (H) 4.0 - 10.5 K/uL   RBC 3.88 3.87 - 5.11 MIL/uL   Hemoglobin 11.2 (L) 12.0 - 15.0 g/dL   HCT 29.5 62.1 - 30.8 %   MCV 100.0 80.0 - 100.0 fL  MCH 28.9 26.0 - 34.0 pg   MCHC 28.9 (L) 30.0 - 36.0 g/dL   RDW 16.1 09.6 - 04.5 %   Platelets 277 150 - 400 K/uL   nRBC 0.2 0.0 - 0.2 %   Neutrophils Relative % 80 %   Neutro Abs 18.5 (H) 1.7 - 7.7 K/uL   Lymphocytes Relative 7 %   Lymphs Abs 1.7 0.7 - 4.0 K/uL   Monocytes Relative 8 %   Monocytes Absolute 1.7 (H) 0.1 - 1.0 K/uL   Eosinophils Relative 0 %   Eosinophils Absolute 0.0 0.0 - 0.5 K/uL   Basophils Relative 0 %   Basophils Absolute 0.1 0.0 - 0.1 K/uL   Immature Granulocytes 5 %   Abs Immature Granulocytes 1.23 (H) 0.00 - 0.07 K/uL    Comment: Performed at [facility], [address], [place], [place] [address]  Brain natriuretic peptide     Status: Abnormal   Collection Time: 10/07/23 11:48 AM  Result Value Ref Range   B Natriuretic Peptide 895.2 (H) 0.0 - 100.0 pg/mL    Comment: Performed at [facility], [address], [place], [place] [address]  I-Stat venous blood gas, ED     Status: Abnormal   Collection Time: 10/07/23 11:55 AM  Result Value Ref Range   pH, Ven 7.387 7.25 - 7.43   pCO2, Ven 75.4 (HH) 44 - 60 mmHg   pO2, Ven 76 (H) 32 - 45 mmHg    Bicarbonate 45.3 (H) 20.0 - 28.0 mmol/L   TCO2 48 (H) 22 - 32 mmol/L   O2 Saturation 94 %   Acid-Base Excess 16.0 (H) 0.0 - 2.0 mmol/L   Sodium 126 (L) 135 - 145 mmol/L   Potassium 3.8 3.5 - 5.1 mmol/L   Calcium, Ion 0.96 (L) 1.15 - 1.40 mmol/L   HCT 41.0 36.0 - 46.0 %   Hemoglobin 13.9 12.0 - 15.0 g/dL   Sample type VENOUS    Comment NOTIFIED PHYSICIAN   Blood gas, arterial     Status: Abnormal   Collection Time: 10/07/23  2:10 PM  Result Value Ref Range   pH, Arterial 7.21 (L) 7.35 - 7.45   pCO2 arterial >123 (HH) 32 - 48 mmHg    Comment: TAYLOR HAYNES RRT BY HUGHESCH AT 1430 PM [id number]   pO2, Arterial 167 (H) 83 - 108 mmHg   Bicarbonate 57.6 (H) 20.0 - 28.0 mmol/L   Acid-Base Excess 22.4 (H) 0.0 - 2.0 mmol/L   O2 Saturation 99 %   Patient temperature 36.4    Collection site RIGHT RADIAL    Drawn by Georgia Lopes RRT    Allens test (pass/fail) PASS PASS    Comment: Performed at [facility], [address] [place], [place] [address]   DG Chest Portable 1 View  Result Date: 10/07/2023 CLINICAL DATA:  Respiratory distress EXAM: PORTABLE CHEST 1 VIEW COMPARISON:  Chest radiograph dated 07/19/2023. FINDINGS: The heart is enlarged. Vascular calcifications are seen in the aortic arch. There are small bilateral pleural effusions with associated atelectasis/airspace disease. No pneumothorax. Degenerative changes are seen in the spine. IMPRESSION: Small bilateral pleural effusions with associated atelectasis/airspace disease. Electronically Signed   By: Romona Curls M.D.   On: 10/07/2023 12:23    Pending Labs Unresulted Labs (From admission, onward)     Start     Ordered   10/14/23 0500  Creatinine, serum  (enoxaparin (LOVENOX)    CrCl >/= 30 ml/min)  Weekly,  R     Comments: while on enoxaparin therapy    10/07/23 1648   10/08/23 0500  CBC  Tomorrow morning,   R        10/07/23 1648   10/08/23 0500  Basic metabolic panel  Tomorrow morning,   R        10/07/23 1648    10/07/23 1647  CBC  (enoxaparin (LOVENOX)    CrCl >/= 30 ml/min)  Once,   R       Comments: Baseline for enoxaparin therapy IF NOT ALREADY DRAWN.  Notify MD if PLT < 100 K.    10/07/23 1648   10/07/23 1647  Creatinine, serum  (enoxaparin (LOVENOX)    CrCl >/= 30 ml/min)  Once,   R       Comments: Baseline for enoxaparin therapy IF NOT ALREADY DRAWN.    10/07/23 1648   10/07/23 1600  Blood gas, venous  Once,   R        10/07/23 1559   10/07/23 1246  MRSA Next Gen by PCR, Nasal  (MRSA Screening)  Once,   URGENT        10/07/23 1245            Vitals/Pain Today's Vitals   10/07/23 1550 10/07/23 1600 10/07/23 1615 10/07/23 1643  BP:  (!) 140/55 (!) 143/53   Pulse:  79 81   Resp:  (!) 23 (!) 29   Temp: 98.4 F (36.9 C)     TempSrc:      SpO2:  97% 96% (!) 79%  Weight:      Height:        Isolation Precautions No active isolations  Medications Medications  vancomycin (VANCOREADY) IVPB 1750 mg/350 mL (1,750 mg Intravenous New Bag/Given 10/07/23 1553)  fentaNYL (SUBLIMAZE) injection 50 mcg (has no administration in time range)  dexmedetomidine (PRECEDEX) 400 MCG/100ML (4 mcg/mL) infusion (has no administration in time range)  docusate sodium (COLACE) capsule 100 mg (has no administration in time range)  polyethylene glycol (MIRALAX / GLYCOLAX) packet 17 g (has no administration in time range)  enoxaparin (LOVENOX) injection 40 mg (has no administration in time range)  atorvastatin (LIPITOR) tablet 80 mg (has no administration in time range)  bethanechol (URECHOLINE) tablet 20 mg (has no administration in time range)  escitalopram (LEXAPRO) tablet 5 mg (has no administration in time range)  HYDROcodone-acetaminophen (NORCO) 10-325 MG per tablet 1 tablet (has no administration in time range)  losartan (COZAAR) tablet 50 mg (has no administration in time range)  pantoprazole (PROTONIX) EC tablet 20 mg (has no administration in time range)  albuterol (PROVENTIL) (2.5 MG/3ML)  0.083% nebulizer solution (10 mg  Given 10/07/23 1152)  albuterol (PROVENTIL) (2.5 MG/3ML) 0.083% nebulizer solution (10 mg  Given 10/07/23 1205)  nitroGLYCERIN (NITROGLYN) 2 % ointment 0.5 inch (0.5 inches Topical Given 10/07/23 1206)  ceFEPIme (MAXIPIME) 2 g in sodium chloride 0.9 % 100 mL IVPB (0 g Intravenous Stopped 10/07/23 1356)  furosemide (LASIX) injection 40 mg (40 mg Intravenous Given 10/07/23 1302)  fentaNYL (SUBLIMAZE) injection 25 mcg (25 mcg Intravenous Given 10/07/23 1546)    Mobility walks with device     Focused Assessments Pulmonary Assessment Handoff:  Lung sounds: Bilateral Breath Sounds: Diminished, Fine crackles L Breath Sounds: Diminished R Breath Sounds: Absent O2 Device: Heated High Flow Nasal Cannula O2 Flow Rate (L/min): 40 L/min    R Recommendations: See Admitting Provider Note  Report given to:   Additional Notes: Pt is much  more alert presently, than upon arrival.  Continues to request narcotics for chronic back pain.

## 2023-10-07 NOTE — Progress Notes (Addendum)
Pt placed on Heated HFNC by RT per MD at bedside, due to desat and increased WOB. Pt tolerating well at this time, vitals stable, Spo2 94%, RN aware, RT will monitor as needed.     10/07/23 1445  Therapy Vitals  Pulse Rate 89  Resp (!) 23  BP (!) 161/82  MEWS Score/Color  MEWS Score 1  MEWS Score Color Green  Respiratory Assessment  Assessment Type Assess only  Respiratory Pattern Regular;Unlabored  Chest Assessment Chest expansion symmetrical  Cough Non-productive  Bilateral Breath Sounds Diminished;Fine crackles  Oxygen Therapy/Pulse Ox  O2 Device (S)  HFNC  $ High Flow Nasal Cannula  Yes  O2 Therapy Oxygen humidified  O2 Flow Rate (L/min) 30 L/min  FiO2 (%) 30 %  SpO2 94 %

## 2023-10-07 NOTE — Progress Notes (Signed)
Pharmacy Antibiotic Note  [name] is a 72 y.o. female for which pharmacy has been consulted for cefepime and vancomycin dosing for pneumonia. Patient presenting with respiratory distress.  SCr 0.61 WBC 23.2; T 98.1; HR 82; RR 18 COVID neg / flu neg  Plan: Cefepime 2g q8hr  Vancomycin 1750 mg once then 1000 mg q24hr (eAUC 472) unless change in renal function Monitor WBC, fever, renal function, cultures De-escalate when able Levels at steady state F/u MRSA PCR  Height: 5\' 3"  (160 cm) Weight: 83.9 kg (185 lb) IBW/kg (Calculated) : 52.4  Temp (24hrs), Avg:98 F (36.7 C), Min:97.6 F (36.4 C), Max:98.4 F (36.9 C)  Recent Labs  Lab 10/07/23 1148  WBC 23.2*  CREATININE 0.61    Estimated Creatinine Clearance: 67.1 mL/min (by C-G formula based on SCr of 0.61 mg/dL).    Allergies  Allergen Reactions   Neurontin [Gabapentin] Other (See Comments)    Lightheadedness  Groggy    Microbiology results: Pending  Thank you for allowing pharmacy to be a part of this patient's care.  Delmar Landau, PharmD, BCPS 10/07/2023 5:07 PM ED Clinical Pharmacist -  [phone]

## 2023-10-07 NOTE — Progress Notes (Signed)
IV Team consult received to start IV for vasopressors. Pt currently has 2 IVs in the forearm, and has adequate access at this time per primary RN. IV Team will remain available as needed.

## 2023-10-07 NOTE — Progress Notes (Signed)
RT at bedside to collect ABG after pt being on BiPAP per MD order. RT collected ABG and resulted ABG using  ISTAT. ISTAT could not result ABG due to CO2 >110. ISTAT results listed below.  RT sent ABG order, ABG sample and ABG order req. to lab per protocol for ABG results. RT called and notified Lab of ABG and explained the reason for sending ED ABG is due to CO2 > 110 on ISTAT.  ED MD aware, ED MD at bedside, RT will  monitor as needed.   ABG ISTAT RESULTS  PH: 7.149 Co2: >110.0 pO2:199

## 2023-10-08 DIAGNOSIS — J9601 Acute respiratory failure with hypoxia: Secondary | ICD-10-CM | POA: Diagnosis not present

## 2023-10-08 DIAGNOSIS — J9612 Chronic respiratory failure with hypercapnia: Secondary | ICD-10-CM | POA: Diagnosis not present

## 2023-10-08 LAB — POCT I-STAT 7, (LYTES, BLD GAS, ICA,H+H)
Acid-Base Excess: 23 mmol/L — ABNORMAL HIGH (ref 0.0–2.0)
Acid-Base Excess: 21 mmol/L — ABNORMAL HIGH (ref 0.0–2.0)
Bicarbonate: 53.7 mmol/L — ABNORMAL HIGH (ref 20.0–28.0)
Bicarbonate: 51.3 mmol/L — ABNORMAL HIGH (ref 20.0–28.0)
Calcium, Ion: 1 mmol/L — ABNORMAL LOW (ref 1.15–1.40)
Calcium, Ion: 1.16 mmol/L (ref 1.15–1.40)
HCT: 35 % — ABNORMAL LOW (ref 36.0–46.0)
HCT: 33 % — ABNORMAL LOW (ref 36.0–46.0)
Hemoglobin: 11.9 g/dL — ABNORMAL LOW (ref 12.0–15.0)
Hemoglobin: 11.2 g/dL — ABNORMAL LOW (ref 12.0–15.0)
O2 Saturation: 91 %
O2 Saturation: 95 %
Patient temperature: 98.3
Potassium: 3.5 mmol/L (ref 3.5–5.1)
Potassium: 5.1 mmol/L (ref 3.5–5.1)
Sodium: 129 mmol/L — ABNORMAL LOW (ref 135–145)
Sodium: 131 mmol/L — ABNORMAL LOW (ref 135–145)
TCO2: >50 mmol/L — ABNORMAL HIGH (ref 22–32)
TCO2: >50 mmol/L — ABNORMAL HIGH (ref 22–32)
pCO2 arterial: 102.9 mm[Hg] (ref 32–48)
pCO2 arterial: 94.7 mm[Hg] (ref 32–48)
pH, Arterial: 7.325 — ABNORMAL LOW (ref 7.35–7.45)
pH, Arterial: 7.341 — ABNORMAL LOW (ref 7.35–7.45)
pO2, Arterial: 72 mm[Hg] — ABNORMAL LOW (ref 83–108)
pO2, Arterial: 84 mm[Hg] (ref 83–108)

## 2023-10-08 LAB — CBC
HCT: 36.1 % (ref 36.0–46.0)
Hemoglobin: 10.5 g/dL — ABNORMAL LOW (ref 12.0–15.0)
MCH: 28.7 pg (ref 26.0–34.0)
MCHC: 29.1 g/dL — ABNORMAL LOW (ref 30.0–36.0)
MCV: 98.6 fL (ref 80.0–100.0)
Platelets: 211 10*3/uL (ref 150–400)
RBC: 3.66 MIL/uL — ABNORMAL LOW (ref 3.87–5.11)
RDW: 12.9 % (ref 11.5–15.5)
WBC: 8.3 10*3/uL (ref 4.0–10.5)
nRBC: 0 % (ref 0.0–0.2)

## 2023-10-08 LAB — BASIC METABOLIC PANEL
Anion gap: 11 (ref 5–15)
BUN: 13 mg/dL (ref 8–23)
CO2: 45 mmol/L — ABNORMAL HIGH (ref 22–32)
Calcium: 8.7 mg/dL — ABNORMAL LOW (ref 8.9–10.3)
Chloride: 78 mmol/L — ABNORMAL LOW (ref 98–111)
Creatinine, Ser: 0.49 mg/dL (ref 0.44–1.00)
GFR, Estimated: >60 mL/min (ref >60)
Glucose, Bld: 166 mg/dL — ABNORMAL HIGH (ref 70–99)
Potassium: 3.5 mmol/L (ref 3.5–5.1)
Sodium: 134 mmol/L — ABNORMAL LOW (ref 135–145)

## 2023-10-08 MED ORDER — ALBUTEROL SULFATE (2.5 MG/3ML) 0.083% IN NEBU
10.0000 mg | INHALATION_SOLUTION | Freq: Once | RESPIRATORY_TRACT | Status: AC
  Administered 2023-10-08: 10 mg via RESPIRATORY_TRACT

## 2023-10-08 MED ORDER — METHYLPREDNISOLONE SODIUM SUCC 40 MG IJ SOLR
40.0000 mg | Freq: Two times a day (BID) | INTRAMUSCULAR | Status: DC
  Administered 2023-10-08 – 2023-10-09 (×2): 40 mg via INTRAVENOUS
  Filled 2023-10-08 (×2): qty 1

## 2023-10-08 MED ORDER — PHENYLEPHRINE HCL-NACL 20-0.9 MG/250ML-% IV SOLN
0.0000 ug/min | INTRAVENOUS | Status: DC
  Administered 2023-10-08: 25 ug/min via INTRAVENOUS
  Filled 2023-10-08: qty 250

## 2023-10-08 MED ORDER — LORAZEPAM 2 MG/ML IJ SOLN
2.0000 mg | Freq: Once | INTRAMUSCULAR | Status: AC
  Administered 2023-10-08: 2 mg via INTRAVENOUS
  Filled 2023-10-08: qty 1

## 2023-10-08 MED ORDER — CALCIUM GLUCONATE-NACL 2-0.675 GM/100ML-% IV SOLN
2.0000 g | Freq: Once | INTRAVENOUS | Status: AC
  Administered 2023-10-08: 2000 mg via INTRAVENOUS
  Filled 2023-10-08: qty 100

## 2023-10-08 MED ORDER — MIDAZOLAM HCL 2 MG/2ML IJ SOLN: 2.0000 mg | Freq: Once | INTRAMUSCULAR | Status: DC

## 2023-10-08 MED ORDER — HALOPERIDOL LACTATE 5 MG/ML IJ SOLN: 5.0000 mg | Freq: Once | INTRAMUSCULAR | Status: AC

## 2023-10-08 MED ORDER — ACETAZOLAMIDE SODIUM 500 MG IJ SOLR
500.0000 mg | Freq: Once | INTRAMUSCULAR | Status: AC
  Administered 2023-10-08: 500 mg via INTRAVENOUS
  Filled 2023-10-08: qty 500

## 2023-10-08 MED ORDER — FUROSEMIDE 10 MG/ML IJ SOLN
40.0000 mg | Freq: Once | INTRAMUSCULAR | Status: AC
  Administered 2023-10-08: 40 mg via INTRAVENOUS
  Filled 2023-10-08: qty 4

## 2023-10-08 MED ORDER — LORAZEPAM 2 MG/ML IJ SOLN
1.0000 mg | Freq: Four times a day (QID) | INTRAMUSCULAR | Status: DC | PRN
  Administered 2023-10-08: 1 mg via INTRAVENOUS
  Filled 2023-10-08 (×2): qty 1

## 2023-10-08 MED ORDER — HALOPERIDOL LACTATE 5 MG/ML IJ SOLN
INTRAMUSCULAR | Status: AC
  Administered 2023-10-08: 5 mg via INTRAVENOUS
  Filled 2023-10-08: qty 1

## 2023-10-08 MED ORDER — HALOPERIDOL LACTATE 5 MG/ML IJ SOLN
5.0000 mg | Freq: Once | INTRAMUSCULAR | Status: AC
  Administered 2023-10-08: 5 mg via INTRAVENOUS
  Filled 2023-10-08: qty 1

## 2023-10-08 MED ORDER — POTASSIUM CHLORIDE 10 MEQ/100ML IV SOLN
10.0000 meq | INTRAVENOUS | Status: AC
  Administered 2023-10-08 (×6): 10 meq via INTRAVENOUS
  Filled 2023-10-08 (×6): qty 100

## 2023-10-08 MED ORDER — LORAZEPAM 2 MG/ML IJ SOLN
2.0000 mg | INTRAMUSCULAR | Status: DC | PRN
  Administered 2023-10-08 – 2023-10-09 (×5): 2 mg via INTRAVENOUS
  Filled 2023-10-08 (×4): qty 1

## 2023-10-08 MED ORDER — FENTANYL CITRATE PF 50 MCG/ML IJ SOSY
100.0000 ug | PREFILLED_SYRINGE | Freq: Once | INTRAMUSCULAR | Status: DC
  Filled 2023-10-08: qty 2

## 2023-10-08 NOTE — Plan of Care (Signed)
  Problem: Safety: Goal: Ability to remain free from injury will improve Outcome: Progressing   Problem: Education: Goal: Knowledge of General Education information will improve Description: Including pain rating scale, medication(s)/side effects and non-pharmacologic comfort measures Outcome: Not Progressing   Problem: Health Behavior/Discharge Planning: Goal: Ability to manage health-related needs will improve Outcome: Not Progressing   Problem: Clinical Measurements: Goal: Ability to maintain clinical measurements within normal limits will improve Outcome: Not Progressing Goal: Respiratory complications will improve Outcome: Not Progressing   Problem: Activity: Goal: Risk for activity intolerance will decrease Outcome: Not Progressing   Problem: Coping: Goal: Level of anxiety will decrease Outcome: Not Progressing

## 2023-10-08 NOTE — Progress Notes (Addendum)
eLink Physician-Brief Progress Note Patient Name: [name] DOB: 1951-03-24 MRN: [id number]   Date of Service  10/08/2023  HPI/Events of Note  Patient on BiPAP and is agitated and trying to remove the BiPAP.  She is on 0.7 of dexmedetomidine.  She states that she is thirsty and wants a drink of water.  eICU Interventions  Video assessment done.  Took BiPAP off and gave patient a couple of sips of water.  She is in a much better mood now.  We will give her a 15-minute break from the BiPAP and put her back on but this time at an increased dose of Precedex of 1.2.  Hopefully, she will not be requiring BiPAP any more in a few hours. Bedside RN present for interaction.        Carilyn Goodpasture 10/08/2023, 5:31 AM  Addendum: Patient agitated and delirious despite being on Precedex.  Video assessment of patient done.  She is getting belligerent.  Haloperidol 5 mg IV x 2 doses given.  She is back on the BiPAP now.  If she does not tolerate this, she might need endotracheal intubation with invasive mechanical ventilation.  Hamsa Laurich 10/08/2023, 6:50 AM.

## 2023-10-08 NOTE — Progress Notes (Signed)
   10/08/23 2050  BiPAP/CPAP/SIPAP  $ Face Mask Small Yes  BiPAP/CPAP/SIPAP Pt Type Adult  BiPAP/CPAP/SIPAP V60  Mask Type Full face mask  Mask Size Small  Set Rate 18 breaths/min  IPAP 20 cmH20  EPAP 6 cmH2O  Pressure Support 14 cmH20  PEEP 6 cmH20  FiO2 (%) 40 %  Minute Ventilation 9.1  Leak 3  Peak Inspiratory Pressure (PIP) 22  Tidal Volume (Vt) 415  Patient Home Equipment No  Auto Titrate No  Press High Alarm 25 cmH2O  Press Low Alarm 5 cmH2O  Safety Check Completed by RT for Home Unit Yes, no issues noted   Will continue to monitor.

## 2023-10-08 NOTE — Progress Notes (Signed)
Repeat ABG did show improvement in pCO2, pH of 7.33, pCO2 of 95  Continue BiPAP  Use as needed Ativan with ongoing Precedex

## 2023-10-08 NOTE — Progress Notes (Signed)
NAME:  [name], MRN:  [id number], DOB:  October 12, 1951, LOS: 1 ADMISSION DATE:  10/07/2023, CONSULTATION DATE:  10/07/2023 REFERRING MD:  Dr Jeraldine Loots, CHIEF COMPLAINT: Hypoxemic respiratory failure  History of Present Illness:  Activated EMS for not feeling well Has been started on antibiotics and steroids by Dr. Octavio Graves on 10/10 Activated EMS with increased work of breathing, saturations noted to be 50% on her usual 2 L of oxygen was given Breathing treatments and brought to the hospital Started on BiPAP Worsening ABG  Pertinent  Medical History   Past Medical History:  Diagnosis Date   Anxiety    Atrophic vaginitis 10/09/2015   Baker's cyst    left leg   Blood in urine 10/09/2015   CHF (congestive heart failure) (HCC)    COPD (chronic obstructive pulmonary disease) (HCC)    Heart murmur    Hematuria 10/09/2015   Hypertension    Nicotine addiction 04/07/2014   Oxygen dependent    4L/min [place] all the time   Pneumonia    Urinary retention 07/01/2020   Urinary urgency 01/25/2016   Vaginal bleeding 10/09/2015     Significant Hospital Events: Including procedures, antibiotic start and stop dates in addition to other pertinent events   10/12 acute on chronic hypoxic respiratory failure with failed outpatient therapies including steroids  CT scan chest 07/23/2023-pleural calcification, fibrosis, emphysema PFT 06/02/2016 severe obstructive lung disease ABG 10/07/2023 7.21/123/167 10/13 persistent hypercapnia despite ongoing BiPAP use   Interim History / Subjective:  Altered and agitated this a.m. with BiPAP in place Precedex drip started overnight  Objective   Blood pressure (!) 141/64, pulse 80, temperature 98.8 F (37.1 C), temperature source Axillary, resp. rate 18, height 5\' 3"  (1.6 m), weight 85.9 kg, SpO2 93%.    FiO2 (%):  [30 %-100 %] 35 % PEEP:  [5 cmH20-6 cmH20] 6 cmH20 Pressure Support:  [7 cmH20-14 cmH20] 14 cmH20   Intake/Output Summary (Last 24 hours)  at 10/08/2023 0837 Last data filed at 10/08/2023 0600 Gross per 24 hour  Intake 788.25 ml  Output 1000 ml  Net -211.75 ml   Filed Weights   10/07/23 1138 10/07/23 1825  Weight: 83.9 kg 85.9 kg    Examination: General: Acute on chronic ill-appearing elderly female lying in bed slightly agitated on Precedex drip but in no acute distress HEENT: BiPAP mask in place, MM pink/moist, PERRL,  Neuro: Altered able to follow commands CV: s1s2 regular rate and rhythm, no murmur, rubs, or gallops,  PULM: Diminished breath sounds bilaterally, no increased work of breathing, BiPAP mask in place GI: soft, bowel sounds active in all 4 quadrants, non-tender, non-distended Extremities: warm/dry, no edema  Skin: no rashes or lesions]  Resolved Hospital Problem list     Assessment & Plan:  Acute hypoxemic/hypercapnic respiratory failure acute on chronic respiratory failure -Failed outpatient therapies Concern for CAP Advanced chronic obstructive pulmonary disease P: Continue BiPAP until mentation improves Scheduled Mikael Spray, Brovana, and albuterol Continue steroids  Precedex drip for BiPAP compliance As needed benzodiazepines as well Intermittently trend ABGs Check urine Legionella and pneumococcal antigen Continue IV Cefepime and vancomycin   History of GERD P: Continue PPI   Diastolic congestive heart failure -Echocardiogram June 2024 EF of 55 to 60% with no WMA and grade 2 diastolic dysfunction Essential hypertension Hyperlipidemia P: Continuous telemetry Continue statin Close monitoring of volume Diurese as able Strict intake and output  Hyponatremia Hypochloremia P: Ongoing correction of acid-base balance is  Best Practice (right click and "Reselect  all SmartList Selections" daily)   Diet/type: NPO DVT prophylaxis: LMWH GI prophylaxis: PPI Lines: N/A Foley:  N/A Code Status:  full code Last date of multidisciplinary goals of care discussion [pending]  CRITICAL  CARE Performed by: [name]   Total critical care time: 38 minutes  Critical care time was exclusive of separately billable procedures and treating other patients.  Critical care was necessary to treat or prevent imminent or life-threatening deterioration.  Critical care was time spent personally by me on the following activities: development of treatment plan with patient and/or surrogate as well as nursing, discussions with consultants, evaluation of patient's response to treatment, examination of patient, obtaining history from patient or surrogate, ordering and performing treatments and interventions, ordering and review of laboratory studies, ordering and review of radiographic studies, pulse oximetry and re-evaluation of patient's condition.  Jean Gellner D. Harris, NP-C [facility] Personal contact information can be found on Amion  If no contact or response made please call [phone] 10/08/2023, 10:13 AM

## 2023-10-09 DIAGNOSIS — J9612 Chronic respiratory failure with hypercapnia: Secondary | ICD-10-CM | POA: Diagnosis not present

## 2023-10-09 DIAGNOSIS — I5033 Acute on chronic diastolic (congestive) heart failure: Secondary | ICD-10-CM

## 2023-10-09 DIAGNOSIS — J9601 Acute respiratory failure with hypoxia: Secondary | ICD-10-CM | POA: Diagnosis not present

## 2023-10-09 LAB — POCT I-STAT 7, (LYTES, BLD GAS, ICA,H+H)
Acid-Base Excess: 17 mmol/L — ABNORMAL HIGH (ref 0.0–2.0)
Bicarbonate: 46.9 mmol/L — ABNORMAL HIGH (ref 20.0–28.0)
Calcium, Ion: 1.19 mmol/L (ref 1.15–1.40)
HCT: 36 % (ref 36.0–46.0)
Hemoglobin: 12.2 g/dL (ref 12.0–15.0)
O2 Saturation: 96 %
Potassium: 4.8 mmol/L (ref 3.5–5.1)
Sodium: 134 mmol/L — ABNORMAL LOW (ref 135–145)
TCO2: 49 mmol/L — ABNORMAL HIGH (ref 22–32)
pCO2 arterial: 87.4 mm[Hg] (ref 32–48)
pH, Arterial: 7.337 — ABNORMAL LOW (ref 7.35–7.45)
pO2, Arterial: 94 mm[Hg] (ref 83–108)

## 2023-10-09 LAB — CBC
HCT: 33.8 % — ABNORMAL LOW (ref 36.0–46.0)
Hemoglobin: 10.1 g/dL — ABNORMAL LOW (ref 12.0–15.0)
MCH: 28.9 pg (ref 26.0–34.0)
MCHC: 29.9 g/dL — ABNORMAL LOW (ref 30.0–36.0)
MCV: 96.8 fL (ref 80.0–100.0)
Platelets: 172 10*3/uL (ref 150–400)
RBC: 3.49 MIL/uL — ABNORMAL LOW (ref 3.87–5.11)
RDW: 12.8 % (ref 11.5–15.5)
WBC: 12.7 10*3/uL — ABNORMAL HIGH (ref 4.0–10.5)
nRBC: 0 % (ref 0.0–0.2)

## 2023-10-09 LAB — BASIC METABOLIC PANEL
Anion gap: 14 (ref 5–15)
BUN: 24 mg/dL — ABNORMAL HIGH (ref 8–23)
CO2: 42 mmol/L — ABNORMAL HIGH (ref 22–32)
Calcium: 9 mg/dL (ref 8.9–10.3)
Chloride: 81 mmol/L — ABNORMAL LOW (ref 98–111)
Creatinine, Ser: 0.71 mg/dL (ref 0.44–1.00)
GFR, Estimated: >60 mL/min (ref >60)
Glucose, Bld: 156 mg/dL — ABNORMAL HIGH (ref 70–99)
Potassium: 4.6 mmol/L (ref 3.5–5.1)
Sodium: 137 mmol/L (ref 135–145)

## 2023-10-09 LAB — GLUCOSE, CAPILLARY: Glucose-Capillary: 154 mg/dL — ABNORMAL HIGH (ref 70–99)

## 2023-10-09 LAB — LEGIONELLA PNEUMOPHILA SEROGP 1 UR AG: L. pneumophila Serogp 1 Ur Ag: NEGATIVE

## 2023-10-09 MED ORDER — POLYETHYLENE GLYCOL 3350 17 G PO PACK
17.0000 g | PACK | Freq: Every day | ORAL | Status: DC
  Administered 2023-10-10 – 2023-10-15 (×6): 17 g via ORAL
  Filled 2023-10-09 (×7): qty 1

## 2023-10-09 MED ORDER — LORAZEPAM 2 MG/ML IJ SOLN
1.0000 mg | INTRAMUSCULAR | Status: DC | PRN
  Administered 2023-10-09 – 2023-10-10 (×2): 1 mg via INTRAVENOUS
  Filled 2023-10-09 (×2): qty 1

## 2023-10-09 MED ORDER — BUDESONIDE 0.25 MG/2ML IN SUSP
0.2500 mg | Freq: Two times a day (BID) | RESPIRATORY_TRACT | Status: DC
  Administered 2023-10-09 – 2023-10-17 (×15): 0.25 mg via RESPIRATORY_TRACT
  Filled 2023-10-09 (×16): qty 2

## 2023-10-09 MED ORDER — METHYLPREDNISOLONE SODIUM SUCC 40 MG IJ SOLR
40.0000 mg | INTRAMUSCULAR | Status: AC
  Administered 2023-10-10 – 2023-10-11 (×2): 40 mg via INTRAVENOUS
  Filled 2023-10-09 (×2): qty 1

## 2023-10-09 MED ORDER — BISACODYL 10 MG RE SUPP
10.0000 mg | Freq: Once | RECTAL | Status: AC
  Administered 2023-10-09: 10 mg via RECTAL
  Filled 2023-10-09: qty 1

## 2023-10-09 MED ORDER — ACETAZOLAMIDE SODIUM 500 MG IJ SOLR
500.0000 mg | Freq: Two times a day (BID) | INTRAMUSCULAR | Status: AC
  Administered 2023-10-09 – 2023-10-10 (×2): 500 mg via INTRAVENOUS
  Filled 2023-10-09 (×2): qty 500

## 2023-10-09 MED ORDER — DOCUSATE SODIUM 100 MG PO CAPS
100.0000 mg | ORAL_CAPSULE | Freq: Two times a day (BID) | ORAL | Status: DC
  Administered 2023-10-10 – 2023-10-15 (×10): 100 mg via ORAL
  Filled 2023-10-09 (×13): qty 1

## 2023-10-09 MED ORDER — STERILE WATER FOR INJECTION IJ SOLN
INTRAMUSCULAR | Status: AC
  Filled 2023-10-09: qty 10

## 2023-10-09 MED ORDER — CARMEX CLASSIC LIP BALM EX OINT
TOPICAL_OINTMENT | CUTANEOUS | Status: DC | PRN
  Filled 2023-10-09: qty 10

## 2023-10-09 MED ORDER — CLONAZEPAM 0.25 MG PO TBDP
0.2500 mg | ORAL_TABLET | Freq: Two times a day (BID) | ORAL | Status: DC
  Filled 2023-10-09 (×2): qty 1

## 2023-10-09 MED ORDER — CHLORHEXIDINE GLUCONATE CLOTH 2 % EX PADS
6.0000 | MEDICATED_PAD | CUTANEOUS | Status: DC
  Administered 2023-10-09 – 2023-10-15 (×7): 6 via TOPICAL

## 2023-10-09 MED ORDER — FUROSEMIDE 10 MG/ML IJ SOLN
40.0000 mg | Freq: Once | INTRAMUSCULAR | Status: AC
  Administered 2023-10-09: 40 mg via INTRAVENOUS
  Filled 2023-10-09: qty 4

## 2023-10-09 NOTE — Progress Notes (Signed)
NAME:  [name], MRN:  [id number], DOB:  09-03-1951, LOS: 2 ADMISSION DATE:  10/07/2023, CONSULTATION DATE:  10/07/2023 REFERRING MD:  Dr Jeraldine Loots, CHIEF COMPLAINT: Hypoxemic respiratory failure  History of Present Illness:  Activated EMS for not feeling well Has been started on antibiotics and steroids by Dr. Sherene Sires on 10/10 Activated EMS with increased work of breathing, saturations noted to be 50% on her usual 2 L of oxygen was given Breathing treatments and brought to the hospital Started on BiPAP Worsening ABG  Pertinent  Medical History   Past Medical History:  Diagnosis Date   Anxiety    Atrophic vaginitis 10/09/2015   Baker's cyst    left leg   Blood in urine 10/09/2015   CHF (congestive heart failure) (HCC)    COPD (chronic obstructive pulmonary disease) (HCC)    Heart murmur    Hematuria 10/09/2015   Hypertension    Nicotine addiction 04/07/2014   Oxygen dependent    4L/min [place] all the time   Pneumonia    Urinary retention 07/01/2020   Urinary urgency 01/25/2016   Vaginal bleeding 10/09/2015     Significant Hospital Events: Including procedures, antibiotic start and stop dates in addition to other pertinent events   10/12 acute on chronic hypoxic respiratory failure with failed outpatient therapies including steroids  CT scan chest 07/23/2023-pleural calcification, fibrosis, emphysema PFT 06/02/2016 severe obstructive lung disease ABG 10/07/2023 7.21/123/167 10/13 persistent hypercapnia despite ongoing BiPAP use 10/14 was apparently agitated overnight and received prn Ativan and on precedex, sedated on bipap   Interim History / Subjective:  Agitated overnight and received prn ativan and precedex, somnolent this AM though ABG is improving   Objective   Blood pressure (!) 135/58, pulse (!) 56, temperature 98.2 F (36.8 C), temperature source Axillary, resp. rate 10, height 5\' 3"  (1.6 m), weight 86.3 kg, SpO2 99%.    FiO2 (%):  [40 %] 40 % PEEP:  [6  cmH20] 6 cmH20 Pressure Support:  [14 cmH20] 14 cmH20   Intake/Output Summary (Last 24 hours) at 10/09/2023 0811 Last data filed at 10/09/2023 0700 Gross per 24 hour  Intake 1769.59 ml  Output 1500 ml  Net 269.59 ml   Filed Weights   10/07/23 1138 10/07/23 1825 10/09/23 0500  Weight: 83.9 kg 85.9 kg 86.3 kg    General:  critically ill-appearing F, no distress on bipap HEENT: MM pink/moist, sclera anicteric Neuro: withdraws to pain, not following commands or waking up CV: s1s2 rrr, no m/r/g PULM:  diminished air entry bilaterally, no distress on bipap 20/5 GI: soft, non-distended  Extremities: warm/dry, trace edema  Skin: no rashes or lesions    Labs: 7.3/87/94/46 Glu 156 Creatinine 0.71 CBC 12.7, Hgb 10.1  Resolved Hospital Problem list     Assessment & Plan:    Acute hypoxemic/hypercapnic respiratory failure acute on chronic respiratory failure Concern for CAP Advanced chronic obstructive pulmonary disease -continue bipap, ABG improved today but is somnolent likely from ativan overnight -decrease prn ativan and start longer acting scheduled klonopin (takes Xanax at home), hold precedex this AM -continue Yupelri, Brovana, pulmicort -Continue solumedrol, no longer wheezing so reduce from 40mg  bid to qd  -Intermittently trend ABGs -Legionella and pneumococcal antigen pending -Continue IV Cefepime   History of GERD -Continue PPI   Diastolic congestive heart failure -Echocardiogram June 2024 EF of 55 to 60% with no WMA and grade 2 diastolic dysfunction Essential hypertension Hyperlipidemia -Lasix 40mg  today, appears volume up, net positive since admission -Continuous telemetry -  Continue statin -strict intake and output  Hyponatremia Hypochloremia Improved -Ongoing monitoring of electrolytes  Best Practice (right click and "Reselect all SmartList Selections" daily)   Diet/type: NPO DVT prophylaxis: LMWH GI prophylaxis: PPI Lines: N/A Foley:   N/A Code Status:  full code Last date of multidisciplinary goals of care discussion [pending], will attempt to reach family today  CRITICAL CARE Performed by: Darcella Gasman Ollie Esty   Total critical care time: 35 minutes  Critical care time was exclusive of separately billable procedures and treating other patients.  Critical care was necessary to treat or prevent imminent or life-threatening deterioration.  Critical care was time spent personally by me on the following activities: development of treatment plan with patient and/or surrogate as well as nursing, discussions with consultants, evaluation of patient's response to treatment, examination of patient, obtaining history from patient or surrogate, ordering and performing treatments and interventions, ordering and review of laboratory studies, ordering and review of radiographic studies, pulse oximetry and re-evaluation of patient's condition.    Darcella Gasman Adeleigh Barletta, PA-C [facility] See Amion for pager If no response to pager , please call [phone] until 7pm After 7:00 pm call Elink  [phone]

## 2023-10-10 DIAGNOSIS — J9612 Chronic respiratory failure with hypercapnia: Secondary | ICD-10-CM | POA: Diagnosis not present

## 2023-10-10 DIAGNOSIS — J9601 Acute respiratory failure with hypoxia: Secondary | ICD-10-CM | POA: Diagnosis not present

## 2023-10-10 LAB — CBC
HCT: 35 % — ABNORMAL LOW (ref 36.0–46.0)
Hemoglobin: 10.3 g/dL — ABNORMAL LOW (ref 12.0–15.0)
MCH: 29.3 pg (ref 26.0–34.0)
MCHC: 29.4 g/dL — ABNORMAL LOW (ref 30.0–36.0)
MCV: 99.7 fL (ref 80.0–100.0)
Platelets: 166 10*3/uL (ref 150–400)
RBC: 3.51 MIL/uL — ABNORMAL LOW (ref 3.87–5.11)
RDW: 13.1 % (ref 11.5–15.5)
WBC: 12.4 10*3/uL — ABNORMAL HIGH (ref 4.0–10.5)
nRBC: 0 % (ref 0.0–0.2)

## 2023-10-10 LAB — BASIC METABOLIC PANEL
BUN: 26 mg/dL — ABNORMAL HIGH (ref 8–23)
CO2: >45 mmol/L — ABNORMAL HIGH (ref 22–32)
Calcium: 8.9 mg/dL (ref 8.9–10.3)
Chloride: 84 mmol/L — ABNORMAL LOW (ref 98–111)
Creatinine, Ser: 0.57 mg/dL (ref 0.44–1.00)
GFR, Estimated: >60 mL/min (ref >60)
Glucose, Bld: 95 mg/dL (ref 70–99)
Potassium: 3.8 mmol/L (ref 3.5–5.1)
Sodium: 139 mmol/L (ref 135–145)

## 2023-10-10 LAB — MAGNESIUM: Magnesium: 2.5 mg/dL — ABNORMAL HIGH (ref 1.7–2.4)

## 2023-10-10 LAB — GLUCOSE, CAPILLARY
Glucose-Capillary: 89 mg/dL (ref 70–99)
Glucose-Capillary: 91 mg/dL (ref 70–99)
Glucose-Capillary: 72 mg/dL (ref 70–99)

## 2023-10-10 LAB — PHOSPHORUS: Phosphorus: 3.6 mg/dL (ref 2.5–4.6)

## 2023-10-10 MED ORDER — STERILE WATER FOR INJECTION IJ SOLN
INTRAMUSCULAR | Status: AC
  Administered 2023-10-10: 10 mL
  Filled 2023-10-10: qty 10

## 2023-10-10 MED ORDER — NOREPINEPHRINE 4 MG/250ML-% IV SOLN
2.0000 ug/min | INTRAVENOUS | Status: DC
  Administered 2023-10-10 (×2): 2 ug/min via INTRAVENOUS
  Administered 2023-10-11: 10 ug/min via INTRAVENOUS
  Filled 2023-10-10 (×2): qty 250

## 2023-10-10 MED ORDER — QUETIAPINE FUMARATE 50 MG PO TABS
50.0000 mg | ORAL_TABLET | Freq: Two times a day (BID) | ORAL | Status: DC
  Administered 2023-10-10 – 2023-10-12 (×6): 50 mg via ORAL
  Filled 2023-10-10 (×6): qty 1

## 2023-10-10 MED ORDER — STERILE WATER FOR INJECTION IJ SOLN
INTRAMUSCULAR | Status: AC
  Filled 2023-10-10: qty 10

## 2023-10-10 MED ORDER — CLONAZEPAM 0.5 MG PO TBDP
0.5000 mg | ORAL_TABLET | Freq: Two times a day (BID) | ORAL | Status: DC
  Administered 2023-10-10 – 2023-10-12 (×5): 0.5 mg via ORAL
  Filled 2023-10-10 (×5): qty 1

## 2023-10-10 MED ORDER — FUROSEMIDE 10 MG/ML IJ SOLN
40.0000 mg | Freq: Two times a day (BID) | INTRAMUSCULAR | Status: DC
  Administered 2023-10-10 (×2): 40 mg via INTRAVENOUS
  Filled 2023-10-10 (×2): qty 4

## 2023-10-10 MED ORDER — SODIUM CHLORIDE 0.9 % IV SOLN: 250.0000 mL | INTRAVENOUS | Status: DC

## 2023-10-10 MED ORDER — ACETAZOLAMIDE SODIUM 500 MG IJ SOLR
500.0000 mg | Freq: Two times a day (BID) | INTRAMUSCULAR | Status: AC
  Administered 2023-10-10 (×2): 500 mg via INTRAVENOUS
  Filled 2023-10-10 (×2): qty 500

## 2023-10-10 NOTE — Evaluation (Signed)
Clinical/Bedside Swallow Evaluation Patient Details  Name: RENITTA HEMRICK MRN: [id number] Date of Birth: 09/13/1951  Today's Date: 10/10/2023 Time: SLP Start Time (ACUTE ONLY): 1550 SLP Stop Time (ACUTE ONLY): 1605 SLP Time Calculation (min) (ACUTE ONLY): 15 min  Past Medical History:  Past Medical History:  Diagnosis Date   Anxiety    Atrophic vaginitis 10/09/2015   Baker's cyst    left leg   Blood in urine 10/09/2015   CHF (congestive heart failure) (HCC)    COPD (chronic obstructive pulmonary disease) (HCC)    Heart murmur    Hematuria 10/09/2015   Hypertension    Nicotine addiction 04/07/2014   Oxygen dependent    4L/min [place] all the time   Pneumonia    Urinary retention 07/01/2020   Urinary urgency 01/25/2016   Vaginal bleeding 10/09/2015   Past Surgical History:  Past Surgical History:  Procedure Laterality Date   ABDOMINAL HYSTERECTOMY     CATARACT EXTRACTION Left 03/2015   CESAREAN SECTION     COLONOSCOPY  10/19/2004   Dr. Jena Gauss- internal hemorrhoids, o/w normal rectum, normal colon   COLONOSCOPY N/A 07/30/2014   UYQ:[id number] diverticulosis. Colonic polyp-removed TA. repeat TCS 07/2021   COLONOSCOPY WITH PROPOFOL N/A 03/29/2021   diverticulosis in sigmoid and descending colon, one 5 mm polyp ascending colon (adenoma), non-bleeding internal hemorrhoids. 5 year surveillance   ESOPHAGOGASTRODUODENOSCOPY N/A 07/30/2014   ZDG:LOVFIE EGD   POLYPECTOMY  03/29/2021   Procedure: POLYPECTOMY;  Surgeon: Corbin Ade, MD;  Location: [facility];  Service: Endoscopy;;   RADIAL HEAD ARTHROPLASTY Left 06/23/2017   Procedure: RADIAL HEAD ARTHROPLASTY WITH LIGAMENT REPAIR;  Surgeon: Cammy Copa, MD;  Location: [facility] OR;  Service: Orthopedics;  Laterality: Left;   HPI:  Patient is a 72 y.o. female with PMH: anxiety, CHF, COPD, HTN, GERD, heart murmur who presented to the hospital on 10/07/2023 after calling EMS from home due to increased WOB, oxygen saturations 50% on her  baseline supplemental oxygen of 2L. She was given breathing treatments by EMS, brought to the hospital and started on BiPAP. She had worsening of her ABG; persistent hypercapnia despite ongoing BiPAP use. On 10/14 she was given Ativan and Precedex secondary to agitation overnight. SLP swallow evaluation ordered to determine patient's readiness to initiate PO diet.    Assessment / Plan / Recommendation  Clinical Impression  Patient is presenting with clinical s/s of what appears to be a cognitive-based dysphagia. Pharyngeal phase of swallow appears [facility] with timely swallow initiation and  no overt s/s aspiration. Patient was awake but continues to be drowsy and kept her eyes closed unless cued to open them. She accepted PO's of thin liquids (water) via straw sips and soft solids (diced, canned peaches). Prolonged mastication occured with peaches but with full oral clearance s/p initial swallow. No overt s/s aspiration observed with thin liquids (water) or with the peaches. SLP recommending initiate PO diet of dysphagia 3 (mechanical soft) solids and thin liquids. She will require full supervision, feeding assist and to only feed when she is fully awake and alert. SLP will follow briefly to ensure diet toleration. SLP Visit Diagnosis: Dysphagia, unspecified (R13.10)    Aspiration Risk  Mild aspiration risk    Diet Recommendation Dysphagia 3 (Mech soft);Thin liquid    Liquid Administration via: Cup;Straw Medication Administration: Whole meds with puree Supervision: Full supervision/cueing for compensatory strategies;Patient able to self feed;Staff to assist with self feeding Compensations: Slow rate;Small sips/bites;Minimize environmental distractions Postural Changes: Seated upright at 90  degrees    Other  Recommendations Oral Care Recommendations: Oral care BID;Oral care QID;Staff/trained caregiver to provide oral care    Recommendations for follow up therapy are one component of a  multi-disciplinary discharge planning process, led by the attending physician.  Recommendations may be updated based on patient status, additional functional criteria and insurance authorization.  Follow up Recommendations No SLP follow up      Assistance Recommended at Discharge    Functional Status Assessment Patient has had a recent decline in their functional status and demonstrates the ability to make significant improvements in function in a reasonable and predictable amount of time.  Frequency and Duration min 1 x/week  1 week       Prognosis Prognosis for improved oropharyngeal function: Good      Swallow Study   General Date of Onset: 10/09/23 HPI: Patient is a 72 y.o. female with PMH: anxiety, CHF, COPD, HTN, GERD, heart murmur who presented to the hospital on 10/07/2023 after calling EMS from home due to increased WOB, oxygen saturations 50% on her baseline supplemental oxygen of 2L. She was given breathing treatments by EMS, brought to the hospital and started on BiPAP. She had worsening of her ABG; persistent hypercapnia despite ongoing BiPAP use. On 10/14 she was given Ativan and Precedex secondary to agitation overnight. SLP swallow evaluation ordered to determine patient's readiness to initiate PO diet. Type of Study: Bedside Swallow Evaluation Previous Swallow Assessment: none found Diet Prior to this Study: NPO Temperature Spikes Noted: No Respiratory Status: Nasal cannula History of Recent Intubation: No Behavior/Cognition: Alert;Lethargic/Drowsy;Requires cueing;Confused;Cooperative Oral Cavity Assessment: Within Functional Limits Oral Care Completed by SLP: No Oral Cavity - Dentition: Adequate natural dentition Vision: Functional for self-feeding Self-Feeding Abilities: Needs assist;Needs set up Patient Positioning: Upright in bed Baseline Vocal Quality: Low vocal intensity Volitional Cough: Cognitively unable to elicit Volitional Swallow: Unable to elicit     Oral/Motor/Sensory Function Overall Oral Motor/Sensory Function: Within functional limits   Ice Chips     Thin Liquid Thin Liquid: Within functional limits Presentation: Straw    Nectar Thick     Honey Thick     Puree Puree: Not tested   Solid     Solid: Impaired Oral Phase Impairments: Impaired mastication Oral Phase Functional Implications: Prolonged oral transit;Impaired mastication      Angela Nevin, MA, CCC-SLP Speech Therapy

## 2023-10-10 NOTE — Progress Notes (Signed)
   10/10/23 2336  BiPAP/CPAP/SIPAP  $ Non-Invasive Ventilator  Non-Invasive Vent Subsequent  BiPAP/CPAP/SIPAP Pt Type Adult  BiPAP/CPAP/SIPAP V60  Mask Type Full face mask  Mask Size Small  Set Rate 18 breaths/min  Respiratory Rate 19 breaths/min  IPAP 20 cmH20  EPAP 5 cmH2O  FiO2 (%) 40 %  Flow Rate 0 lpm  Minute Ventilation 16.2  Leak 27  Peak Inspiratory Pressure (PIP) 17  Tidal Volume (Vt) 702  Auto Titrate No  Press High Alarm 25 cmH2O  Press Low Alarm 5 cmH2O  CPAP/SIPAP surface wiped down Yes  BiPAP/CPAP /SiPAP Vitals  SpO2 98 %

## 2023-10-10 NOTE — Progress Notes (Signed)
RT attempted ABG x2 with no success.

## 2023-10-10 NOTE — Progress Notes (Signed)
Precedex stopped at 0915 by MD. Pt ripped off BiPAP mask and refusing to wear it at this time. PO clonazepam and Seroquel given. Will give time to be effective and will attempt to place BiPAP back on once calm.

## 2023-10-10 NOTE — Progress Notes (Signed)
NAME:  [name], MRN:  [id number], DOB:  [birth year], LOS: 3 ADMISSION DATE:  10/07/2023, CONSULTATION DATE:  10/07/2023 REFERRING MD:  Dr Jeraldine Loots, CHIEF COMPLAINT: Hypoxemic respiratory failure  History of Present Illness:  Activated EMS for not feeling well Has been started on antibiotics and steroids by Dr. Sherene Sires on 10/10 Activated EMS with increased work of breathing, saturations noted to be 50% on her usual 2 L of oxygen was given Breathing treatments and brought to the hospital Started on BiPAP Worsening ABG  Pertinent  Medical History   Past Medical History:  Diagnosis Date   Anxiety    Atrophic vaginitis 10/09/2015   Baker's cyst    left leg   Blood in urine 10/09/2015   CHF (congestive heart failure) (HCC)    COPD (chronic obstructive pulmonary disease) (HCC)    Heart murmur    Hematuria 10/09/2015   Hypertension    Nicotine addiction 04/07/2014   Oxygen dependent    4L/min [place] all the time   Pneumonia    Urinary retention 07/01/2020   Urinary urgency 01/25/2016   Vaginal bleeding 10/09/2015     Significant Hospital Events: Including procedures, antibiotic start and stop dates in addition to other pertinent events   10/12 acute on chronic hypoxic respiratory failure with failed outpatient therapies including steroids  CT scan chest 07/23/2023-pleural calcification, fibrosis, emphysema PFT 06/02/2016 severe obstructive lung disease ABG 10/07/2023 7.21/123/167 10/13 persistent hypercapnia despite ongoing BiPAP use 10/14 was apparently agitated overnight and received prn Ativan and on precedex, sedated on bipap   Interim History / Subjective:  Patient again became more agitated overnight, requiring Precedex infusion Refused BiPAP, pCO2 went up over 100.  Placed on BiPAP this morning Precedex was stopped  Objective   Blood pressure (!) 125/54, pulse 68, temperature 98.5 F (36.9 C), temperature source Oral, resp. rate 19, height 5\' 3"  (1.6 m), weight 86.3  kg, SpO2 97%.    FiO2 (%):  [40 %] 40 %   Intake/Output Summary (Last 24 hours) at 10/10/2023 0859 Last data filed at 10/10/2023 0600 Gross per 24 hour  Intake 681.34 ml  Output 2650 ml  Net -1968.66 ml   Filed Weights   10/09/23 0500 10/09/23 2029 10/10/23 0158  Weight: 86.3 kg 86.3 kg 86.3 kg    Physical exam: General: Acute on chronically ill-appearing female, lying on the bed, on BiPAP HEENT: [place]/AT, eyes anicteric.  moist mucus membranes Neuro: Lethargic, opens eyes with vocal stimuli, following simple commands, moving all 4 extremities Chest: Coarse breath sounds, no wheezes or rhonchi Heart: Regular rate and rhythm, no murmurs or gallops Abdomen: Soft, nontender, nondistended, bowel sounds present Skin: No rash  Labs and images reviewed  Resolved Hospital Problem list     Assessment & Plan:  Acute hypoxemic/hypercapnic respiratory failure Community-acquired pneumonia Advanced chronic obstructive pulmonary disease with acute exacerbation Acute metabolic encephalopathy in the setting of hypercapnia Continue BiPAP overnight Trend ABGs pCO2 remains over 100 Will add acetazolamide to help dec Serum bicarbonate which in turn will decrease pCO2-continue bipap Continue Yupelri, Brovana, pulmicort Continue Solu-Medrol 40 mg daily to complete 5 days treatment Continue IV antibiotics  Acute on chronic diastolic congestive heart failure -Echocardiogram June 2024 EF of 55 to 60% with no WMA and grade 2 diastolic dysfunction Essential hypertension Hyperlipidemia Continue diuresis with Lasix 40 mg twice daily Monitor intake and output Started on Coreg GDMT as tolerated Continue statin  Hyponatremia, resolved  Obesity Diet and exercise counseling as appropriate  Best Practice (  right click and "Reselect all SmartList Selections" daily)   Diet/type: Resume regular diet DVT prophylaxis: LMWH GI prophylaxis: PPI Lines: N/A Foley:  N/A Code Status:  full code Last  date of multidisciplinary goals of care discussion [pending],  The patient is critically ill due to acute encephalopathy/acute respiratory failure with hypoxia and hypercapnia.  Critical care was necessary to treat or prevent imminent or life-threatening deterioration.  Critical care was time spent personally by me on the following activities: development of treatment plan with patient and/or surrogate as well as nursing, discussions with consultants, evaluation of patient's response to treatment, examination of patient, obtaining history from patient or surrogate, ordering and performing treatments and interventions, ordering and review of laboratory studies, ordering and review of radiographic studies, pulse oximetry, re-evaluation of patient's condition and participation in multidisciplinary rounds.   During this encounter critical care time was devoted to patient care services described in this note for 37 minutes.   Cheri Fowler, MD  Pulmonary Critical Care See Amion for pager If no response to pager, please call [phone] until 7pm After 7pm, Please call E-link [phone]

## 2023-10-10 NOTE — Progress Notes (Signed)
Elink notified of patients heart rate spiking up to 186 and back down to 65. Labs reordered for now. Phlebotomy called to inform them of lab change with no answer. Will keep trying to call them. Continuing to follow patients care plan.

## 2023-10-11 ENCOUNTER — Inpatient Hospital Stay (HOSPITAL_COMMUNITY): Payer: Medicare Other

## 2023-10-11 DIAGNOSIS — J441 Chronic obstructive pulmonary disease with (acute) exacerbation: Secondary | ICD-10-CM

## 2023-10-11 DIAGNOSIS — J9601 Acute respiratory failure with hypoxia: Secondary | ICD-10-CM | POA: Diagnosis not present

## 2023-10-11 DIAGNOSIS — J9612 Chronic respiratory failure with hypercapnia: Secondary | ICD-10-CM | POA: Diagnosis not present

## 2023-10-11 LAB — CBC WITH DIFFERENTIAL/PLATELET
Abs Immature Granulocytes: 0.12 10*3/uL — ABNORMAL HIGH (ref 0.00–0.07)
Basophils Absolute: 0 10*3/uL (ref 0.0–0.1)
Basophils Relative: 0 %
Eosinophils Absolute: 0 10*3/uL (ref 0.0–0.5)
Eosinophils Relative: 0 %
HCT: 38 % (ref 36.0–46.0)
Hemoglobin: 10.6 g/dL — ABNORMAL LOW (ref 12.0–15.0)
Immature Granulocytes: 1 %
Lymphocytes Relative: 10 %
Lymphs Abs: 1.6 10*3/uL (ref 0.7–4.0)
MCH: 28.6 pg (ref 26.0–34.0)
MCHC: 27.9 g/dL — ABNORMAL LOW (ref 30.0–36.0)
MCV: 102.7 fL — ABNORMAL HIGH (ref 80.0–100.0)
Monocytes Absolute: 2.7 10*3/uL — ABNORMAL HIGH (ref 0.1–1.0)
Monocytes Relative: 17 %
Neutro Abs: 11.7 10*3/uL — ABNORMAL HIGH (ref 1.7–7.7)
Neutrophils Relative %: 72 %
Platelets: 235 10*3/uL (ref 150–400)
RBC: 3.7 MIL/uL — ABNORMAL LOW (ref 3.87–5.11)
RDW: 13.6 % (ref 11.5–15.5)
WBC: 16.1 10*3/uL — ABNORMAL HIGH (ref 4.0–10.5)
nRBC: 0 % (ref 0.0–0.2)

## 2023-10-11 LAB — BASIC METABOLIC PANEL
Anion gap: 9 (ref 5–15)
Anion gap: 8 (ref 5–15)
BUN: 40 mg/dL — ABNORMAL HIGH (ref 8–23)
BUN: 32 mg/dL — ABNORMAL HIGH (ref 8–23)
CO2: 43 mmol/L — ABNORMAL HIGH (ref 22–32)
CO2: 44 mmol/L — ABNORMAL HIGH (ref 22–32)
Calcium: 8.5 mg/dL — ABNORMAL LOW (ref 8.9–10.3)
Calcium: 9.1 mg/dL (ref 8.9–10.3)
Chloride: 85 mmol/L — ABNORMAL LOW (ref 98–111)
Chloride: 86 mmol/L — ABNORMAL LOW (ref 98–111)
Creatinine, Ser: 1.04 mg/dL — ABNORMAL HIGH (ref 0.44–1.00)
Creatinine, Ser: 0.75 mg/dL (ref 0.44–1.00)
GFR, Estimated: 58 mL/min — ABNORMAL LOW (ref >60)
GFR, Estimated: >60 mL/min (ref >60)
Glucose, Bld: 87 mg/dL (ref 70–99)
Glucose, Bld: 134 mg/dL — ABNORMAL HIGH (ref 70–99)
Potassium: 3.8 mmol/L (ref 3.5–5.1)
Potassium: 3.7 mmol/L (ref 3.5–5.1)
Sodium: 137 mmol/L (ref 135–145)
Sodium: 138 mmol/L (ref 135–145)

## 2023-10-11 LAB — TROPONIN I (HIGH SENSITIVITY): Troponin I (High Sensitivity): 29 ng/L — ABNORMAL HIGH (ref <18)

## 2023-10-11 LAB — POCT I-STAT 7, (LYTES, BLD GAS, ICA,H+H)
Acid-Base Excess: 14 mmol/L — ABNORMAL HIGH (ref 0.0–2.0)
Bicarbonate: 44.9 mmol/L — ABNORMAL HIGH (ref 20.0–28.0)
Calcium, Ion: 1.17 mmol/L (ref 1.15–1.40)
HCT: 37 % (ref 36.0–46.0)
Hemoglobin: 12.6 g/dL (ref 12.0–15.0)
O2 Saturation: 91 %
Patient temperature: 99.2
Potassium: 3.7 mmol/L (ref 3.5–5.1)
Sodium: 134 mmol/L — ABNORMAL LOW (ref 135–145)
TCO2: 48 mmol/L — ABNORMAL HIGH (ref 22–32)
pCO2 arterial: 101.9 mm[Hg] (ref 32–48)
pH, Arterial: 7.254 — ABNORMAL LOW (ref 7.35–7.45)
pO2, Arterial: 77 mm[Hg] — ABNORMAL LOW (ref 83–108)

## 2023-10-11 LAB — MAGNESIUM: Magnesium: 2.3 mg/dL (ref 1.7–2.4)

## 2023-10-11 LAB — GLUCOSE, CAPILLARY
Glucose-Capillary: 83 mg/dL (ref 70–99)
Glucose-Capillary: 123 mg/dL — ABNORMAL HIGH (ref 70–99)

## 2023-10-11 LAB — PHOSPHORUS: Phosphorus: 2.4 mg/dL — ABNORMAL LOW (ref 2.5–4.6)

## 2023-10-11 LAB — D-DIMER, QUANTITATIVE: D-Dimer, Quant: 0.5 ug{FEU}/mL (ref 0.00–0.50)

## 2023-10-11 MED ORDER — STERILE WATER FOR INJECTION IJ SOLN
INTRAMUSCULAR | Status: AC
  Filled 2023-10-11: qty 10

## 2023-10-11 MED ORDER — DIGOXIN 0.25 MG/ML IJ SOLN
0.1250 mg | Freq: Once | INTRAMUSCULAR | Status: AC | PRN
  Administered 2023-10-11: 0.125 mg via INTRAVENOUS
  Filled 2023-10-11: qty 0.5

## 2023-10-11 MED ORDER — ORAL CARE MOUTH RINSE
15.0000 mL | OROMUCOSAL | Status: DC
  Administered 2023-10-11 – 2023-10-17 (×20): 15 mL via OROMUCOSAL

## 2023-10-11 MED ORDER — DILTIAZEM HCL-DEXTROSE 125-5 MG/125ML-% IV SOLN (PREMIX)
5.0000 mg/h | INTRAVENOUS | Status: DC
  Administered 2023-10-11: 5 mg/h via INTRAVENOUS
  Filled 2023-10-11 (×2): qty 125

## 2023-10-11 MED ORDER — ADENOSINE 6 MG/2ML IV SOLN
INTRAVENOUS | Status: AC
  Administered 2023-10-11: 6 mg via INTRAVENOUS
  Filled 2023-10-11: qty 2

## 2023-10-11 MED ORDER — ACETAZOLAMIDE SODIUM 500 MG IJ SOLR
500.0000 mg | Freq: Two times a day (BID) | INTRAMUSCULAR | Status: AC
  Administered 2023-10-11 (×2): 500 mg via INTRAVENOUS
  Filled 2023-10-11 (×2): qty 500

## 2023-10-11 MED ORDER — DILTIAZEM LOAD VIA INFUSION
10.0000 mg | Freq: Once | INTRAVENOUS | Status: AC
  Administered 2023-10-11: 10 mg via INTRAVENOUS

## 2023-10-11 MED ORDER — LORAZEPAM 2 MG/ML IJ SOLN
INTRAMUSCULAR | Status: AC
  Filled 2023-10-11: qty 1

## 2023-10-11 MED ORDER — HALOPERIDOL LACTATE 5 MG/ML IJ SOLN
2.0000 mg | Freq: Four times a day (QID) | INTRAMUSCULAR | Status: DC | PRN
  Administered 2023-10-11: 2 mg via INTRAVENOUS
  Filled 2023-10-11: qty 1

## 2023-10-11 MED ORDER — ADENOSINE 6 MG/2ML IV SOLN: 6.0000 mg | Freq: Once | INTRAVENOUS | Status: AC

## 2023-10-11 MED ORDER — FUROSEMIDE 10 MG/ML IJ SOLN: 40.0000 mg | Freq: Once | INTRAMUSCULAR | Status: DC

## 2023-10-11 MED ORDER — DILTIAZEM LOAD VIA INFUSION
10.0000 mg | Freq: Once | INTRAVENOUS | Status: AC
  Administered 2023-10-11: 10 mg via INTRAVENOUS
  Filled 2023-10-11: qty 10

## 2023-10-11 MED ORDER — DEXMEDETOMIDINE HCL IN NACL 400 MCG/100ML IV SOLN: 0.0000 ug/kg/h | INTRAVENOUS | Status: DC

## 2023-10-11 MED ORDER — LORAZEPAM 2 MG/ML IJ SOLN
1.0000 mg | INTRAMUSCULAR | Status: DC | PRN
  Administered 2023-10-11: 1 mg via INTRAVENOUS
  Filled 2023-10-11: qty 1

## 2023-10-11 MED ORDER — ORAL CARE MOUTH RINSE: 15.0000 mL | OROMUCOSAL | Status: DC | PRN

## 2023-10-11 MED ORDER — ENOXAPARIN SODIUM 100 MG/ML IJ SOSY
90.0000 mg | PREFILLED_SYRINGE | Freq: Two times a day (BID) | INTRAMUSCULAR | Status: DC
  Administered 2023-10-11: 90 mg via SUBCUTANEOUS
  Filled 2023-10-11 (×2): qty 0.9

## 2023-10-11 NOTE — Progress Notes (Signed)
NAME:  [name], MRN:  [id number], DOB:  January 23, 1951, LOS: 4 ADMISSION DATE:  10/07/2023, CONSULTATION DATE:  10/07/2023 REFERRING MD:  Dr Jeraldine Loots, CHIEF COMPLAINT: Hypoxemic respiratory failure  History of Present Illness:  Activated EMS for not feeling well Has been started on antibiotics and steroids by Dr. Sherene Sires on 10/10 Activated EMS with increased work of breathing, saturations noted to be 50% on her usual 2 L of oxygen was given Breathing treatments and brought to the hospital Started on BiPAP Worsening ABG  Pertinent  Medical History   Past Medical History:  Diagnosis Date   Anxiety    Atrophic vaginitis 10/09/2015   Baker's cyst    left leg   Blood in urine 10/09/2015   CHF (congestive heart failure) (HCC)    COPD (chronic obstructive pulmonary disease) (HCC)    Heart murmur    Hematuria 10/09/2015   Hypertension    Nicotine addiction 04/07/2014   Oxygen dependent    4L/min [place] all the time   Pneumonia    Urinary retention 07/01/2020   Urinary urgency 01/25/2016   Vaginal bleeding 10/09/2015     Significant Hospital Events: Including procedures, antibiotic start and stop dates in addition to other pertinent events   10/12 acute on chronic hypoxic respiratory failure with failed outpatient therapies including steroids  CT scan chest 07/23/2023-pleural calcification, fibrosis, emphysema PFT 06/02/2016 severe obstructive lung disease ABG 10/07/2023 7.21/123/167 10/13 persistent hypercapnia despite ongoing BiPAP use 10/14 was apparently agitated overnight and received prn Ativan and on precedex, sedated on bipap  10/16 early AM, BiPAP changed to AVAPS to target MV 12-15  Interim History / Subjective:  Recurrent hypercapnic and hypoxic respiratory failure. ABG 7/25/102/77. BiPAP was on 20/5 with rate 18. Her MV was around 5-6; therefore, changed to AVAPS to target MV 12-15.  Agitated this AM, pulling BiPAP off, refusing to keep on.  Objective   Blood  pressure (!) 145/53, pulse 91, temperature 99 F (37.2 C), temperature source Axillary, resp. rate 16, height 5\' 3"  (1.6 m), weight 86.3 kg, SpO2 98%.    FiO2 (%):  [40 %] 40 %   Intake/Output Summary (Last 24 hours) at 10/11/2023 0750 Last data filed at 10/11/2023 0600 Gross per 24 hour  Intake 585.62 ml  Output 1500 ml  Net -914.38 ml   Filed Weights   10/09/23 2029 10/10/23 0158 10/11/23 0500  Weight: 86.3 kg 86.3 kg 86.3 kg    Physical exam: General: Acute on chronically ill-appearing female, resting in bed, on BiPAP HEENT: [place]/AT, eyes anicteric.  BiPAP in place Neuro: Somnolent but arouses to noxious stimuli. Slow to follow commands. Agitated and pulling BiPAP off Chest: Normal effort on BiPAP. Clear bilaterally Heart: Regular rate and rhythm, no murmurs or gallops Abdomen: Soft, nontender, nondistended, bowel sounds present Skin: No rash   Assessment & Plan:   Acute on chronic hypoxemic/hypercapnic respiratory failure Community-acquired pneumonia Advanced chronic obstructive pulmonary disease with acute exacerbation Acute metabolic encephalopathy in the setting of hypercapnia Continue BiPAP on AVAPS at current settings Repeat Acetazolamide today Continue Yupelri, Brovana, pulmicort Hold additional steroids for now Continue Cefepime  Agitation and ICU delirium Try low dose Haldol and Ativan to try and encourage her to use BiPAP (currently removing) Avoid ongoing sedation as able Will try to avoid Precedex  Acute on chronic diastolic congestive heart failure -Echocardiogram June 2024 EF of 55 to 60% with no WMA and grade 2 diastolic dysfunction Essential hypertension - now hypotensive on low dose Levo  Hyperlipidemia Hold further lasix to avoid further contraction alkalosis  Monitor intake and output D/c Losartan as has hypotension requiring low dose Levophed GDMT on hold for now Continue statin  Obesity Diet and exercise counseling as  appropriate  AKI Hold further Lasix today Follow BMP  Best Practice (right click and "Reselect all SmartList Selections" daily)   Diet/type: Dysphagia 3 once off BiPAP DVT prophylaxis: LMWH GI prophylaxis: PPI Lines: N/A Foley:  N/A Code Status:  full code Last date of multidisciplinary goals of care discussion [pending],  CC time: 35 min.   Rutherford Guys, PA - [facility] For pager details, please see AMION or use Epic chat  After 1900, please call [facility] for cross coverage needs 10/11/2023, 8:15 AM

## 2023-10-11 NOTE — Progress Notes (Signed)
eLink Physician-Brief Progress Note Patient Name: [name] DOB: 1951-11-13 MRN: [id number]   Date of Service  10/11/2023  HPI/Events of Note  Retaining urine with purewick. bladder scan > 700  eICU Interventions  Ordered in and out cath     Intervention Category Intermediate Interventions: Other:  Darl Pikes 10/11/2023, 3:26 AM

## 2023-10-11 NOTE — Progress Notes (Addendum)
eLink Physician-Brief Progress Note Patient Name: [name] DOB: 10-17-1951 MRN: [id number]   Date of Service  10/11/2023  HPI/Events of Note  Bedside RN states all sedation DC'd om dayshift. Patient sundowns really bad, confused and yelling help and trying to get out of bed. Bedside RN asking for Posey vest and also possible PRN's to aide in Bipap compliance at bedtime.  Camera: On nasal o2, confused. VS stable.  Going to wear BiPAP soon per RN discussion.   eICU Interventions  - for now to give all scheduled anti anxiety and anti psychotics. To go on BiPAP. Call back if not better. Posey vest ordered, fall precautions.      Intervention Category Intermediate Interventions: Other: Minor Interventions: Agitation / anxiety - evaluation and management  Ranee Gosselin 10/11/2023, 7:30 PM  22:15 Camera alert: Discussed with RN. PAC's. MAP ok. HR > 100. Refusing BiPAP. On diamox for tyep 2 failure, alkalosis.   - get BMP, mag, ion calcium.  Replace if low.   22:36 Bedside RN did EKG which read Afib w/RVR. Patient converted back to NSR x2 but is back in Afib w/RVR on monitor with current HR = 175.  Camera: Stat Adenosine- showed a flutter, variable , new onset. -labs are not drawn yet. Keep K/Mag > 4/2 respectively. - to go on Cardizem 10 mg IV stat and on to drip for rate control - lovenox 1 mg/kg q12, no contra indication.  - d dimer, troponin series. - if d dimer elevated, consider CTPA r/o PE.   23:35  Camera; HR still over 175, MAP 70, no diaphoresis.  Discussed with RN - Cardizem 10 mg IV bolus - diogxin 125 mcg prn if HR still over 140 - d dimer normal, so no PE. - other labs are yet to result. - if still not better, if K/mag ok, would consider precedex  for BiPAP  00:16 Kphos 15 mmols ordered for low Po4, K at 3.7, cr normal. Mag > 2

## 2023-10-11 NOTE — Progress Notes (Signed)
eLink Physician-Brief Progress Note Patient Name: AREATHA KALATA DOB: February 12, 1951 MRN: [id number]   Date of Service  10/11/2023  HPI/Events of Note  ABG 7.25/101/77 On BiPap 20/5 rate of 18 TV 200s to 300s MV 5-6 Intermittently TV > 1000 MV 20s but does not happen too often  eICU Interventions  Discussed with bedside RN and RT. Will change NIV to AVAPS to target MV 12-15     Intervention Category Major Interventions: Respiratory failure - evaluation and management Intermediate Interventions: Hypotension - evaluation and management  Darl Pikes 10/11/2023, 5:59 AM

## 2023-10-11 NOTE — Progress Notes (Deleted)
[name], female    DOB: 1950/12/30    MRN: [id number]   Brief patient profile:  72  yowf  quit smoking 2014 and on 02 since 2009  referred to pulmonary clinic in [place]  07/26/2022 by Eboni for copd GOLD 4  / 02 dep  then admit:  Admit date: 06/29/2022 Discharge date: 07/07/2022 Admitted From: Home Disposition: SNF Recommendations for Outpatient Follow-up:  Please obtain BMP and CBC with differential in 1 week Outpatient follow-up with pulmonology about left lower lobe nodule Please follow up on the following pending results: None        Hospital course 72 year old F with PMH of COPD/chronic RF on 2 L, diastolic CHF, HTN, chronic back pain, urinary retention and anxiety presenting with progressive confusion and fever, and admitted for severe sepsis and acute metabolic encephalopathy due to acute pyelonephritis and E. coli bacteremia.  Patient was febrile to 101.7 with leukocytosis to 22 and hypotension to 90/41.  She was lethargic and had an AKI with creatinine of 1.3.  She also had elevated troponin to 902 that has trended up to 1114.  EKG without significant acute ischemic finding.  CT head negative.  CT chest abdomen and pelvis with contrast showed 7 mm LLL nodule, left pyelonephritis and minimal fullness of proximal ureter without obstruction.  Cultures obtained.  Foley catheter placed.  Patient was started on IV fluid and broad-spectrum antibiotics and admitted.  Cardiology consulted.    TTE with LVEF of 55 to 60%, G1 DD and moderate TVR.  Nuclear stress test with normal LV perfusion and no evidence of ischemia but poor quality due to patient's movement.    CTA chest PE protocol on 7/8 with increased clustered ill-defined GG nodularity in the superior segment of LLL and scattered tiny centrilobular nodules in both lungs consistent with infectious or inflammatory bronchiolitis and concern for aspiration pneumonia given the small amount of secretion in trachea.  MRI lumbar spine  without acute finding or osteomyelitis but multilevel spondylosis and moderate bilateral neuroforaminal stenosis at L5-S1.   Patient blood culture grew pansensitive E. coli.  Antibiotic de-escalated to IV Ancef.  She is discharged on p.o. cefadroxil for 6 more days to complete treatment course for E. coli bacteremia and pyelonephritis.    Therapy recommended SNF.   See individual problem list below for more.    Problems addressed during this hospitalization Principal Problem:   Severe sepsis (HCC) Active Problems:   Urinary retention   COPD exacerbation (HCC)   Anxiety   Acute on chronic diastolic CHF (congestive heart failure) (HCC)   Troponin level elevated   Acute pyelonephritis   E coli bacteremia   Acute metabolic encephalopathy   Acute on chronic respiratory failure with hypoxia and hypercapnia (HCC)   Urinary tract infection   Chronic lower back pain   Pulmonary nodule   Obesity (BMI 30-39.9)   Normocytic anemia   Severe sepsis due to acute left pyelonephritis and E. coli bacteremia in the setting of urinary retention, and possible aspiration pneumonia: POA.  Patient had fever, leukocytosis, hypotension, AKI and encephalopathy on presentation.  Blood culture with pansensitive E. coli.  Imaging concerning for left pyelonephritis and left ureteral fullness without obstructing stone.  Imaging also raises concern about left lung pneumonia and possible aspiration. -7/5 Vanco and cefepime>> 7/6 ceftriaxone> 7/10 Ancef> 7/13-7/19 cefadroxil -Doxycycline 7/11-7/15   Acute metabolic encephalopathy: Likely due to the above.  She is also at risk for polypharmacy due to pain medications other sedating  medications.  Resolved. -Reorientation and delirium precautions -Minimize sedating medications   Possible aspiration pneumonia: CTA chest negative for PE but raises concern for LLL pneumonia with concern for aspiration pneumonia given fluid in trachea. -Low risk for aspiration per SLP.    Acute on chronic diastolic CHF: Evidence of fluid overload per exam by previous attending.  Likely in the setting of fluid resuscitation for sepsis.  TTE with normal LVEF, G1 DD and RVSP of 44.  BNP elevated but improved with diuretics.  Diuresed with IV Lasix.  Net -8.5 L.  Appears euvolemic on exam -P.o. Lasix 20 mg daily -Reassess BMP and fluid status at follow-up.   COPD exacerbation: Resolved. -Continue LABA/LAMA/ICS -As needed albuterol -Minimum oxygen to keep saturation above 90%   Acute on chronic hypoxic and hypercarbic respiratory failure/OSA not on CPAP: On 2 L at baseline.  -Minimum oxygen to keep saturation above 90%.  She is high risk for CO2 retention   Acute kidney injury: Resolved. Recent Labs (within last 365 days)             Recent Labs    06/29/22 2057 06/30/22 0451 07/01/22 0207 07/02/22 0122 07/03/22 0221 07/04/22 0049 07/05/22 0231 07/06/22 0301 07/07/22 0035  BUN 26* 25* 30* 23 20 21 19 15 12   CREATININE 1.29* 1.12* 0.94 0.83 0.67 0.91 0.86 0.79 0.64    -Recheck BMP in 1 week   Atypical chest pain/elevated troponin: Troponin elevated to 900 and peaked at 1114.  Demand ischemia?  TTE as above.  Nuclear stress test negative but poor quality.  Chest pain could be due to pneumonia as well.  Symptoms resolved.  Cardiology signed off.  At this point, we can say non-STEMI ruled out. -Continue aspirin and statin.   Essential hypertension: Normotensive off home Hyzaar. -Continue home Cardizem CD -P.o. Lasix 20 mg daily   Urinary retention: History of this.  She had Foley placed in ED. -Continue Flomax and bethanechol -Passed voiding trial.   Chronic low back pain-lumbar MRI without acute finding but spondylolysis with moderate bilateral neuroforaminal stenosis at L5-S1. -Continue therapy at SNF -Continue home Norco but not a great choice with her respiratory issue.   Normocytic anemia: Stable. -Recheck CBC at follow-up   8 mm ground-glass nodule in  the left lower lobe.  PCP to arrange outpatient pulmonary follow-up within 1 to 2 weeks of discharge for follow-up and monitoring.   Obesity Body mass index is 32.26 kg/m.     History of Present Illness  07/26/2022  Pulmonary/ 1st office eval/ [name] / [place] Office home from NH x one week p above admit  Chief Complaint  Patient presents with   Consult    Consult for O2 dropping normally uses 2LO2 cont.   Dyspnea:  indoor walking room to room  Cough: no am flares/ sporadic daytime min mucoid production Sleep: on side bed is flat 2 pillows  SABA use: hfa 3-4 x daily  02:  2lpm hs and does not titrate daytime Rec Continue trelegy one click each am  - take two good drags then rinse gargle  Only use your albuterol as a rescue medication Ok to try albuterol 15 min before an activity (on alternating days)  that you know would usually make you short of breath Make sure you check your oxygen saturation  AT  your highest level of activity (not after you stop)   to be sure it stays over 90%    10/18/2022  f/u ov/[place] office/[name] re: GOLD  4 maint on 02 24/7 and trelegy   Chief Complaint  Patient presents with   Follow-up    Doing well. Has concerns about a lung nodule mentioned from CT scan in July   Dyspnea:  walking more and planning walking outside, does not understand saba pre challenge or titration of 02 with exertion  Cough: much better  Sleeping: flat bed / 2 pillows  SABA use: hfa rarely / never neb  02: 2lpm hs up to 3lpm with sats 90 on 3lpm  Covid status: vax max  Rec Also  Ok to try albuterol 15 min before an activity (on alternating days)  that you know would usually make you short of breath  Make sure you check your oxygen saturation  AT  your highest level of activity (not after you stop)   to be sure it stays over 90%    06/30/2023  f/u ov/[place] office/[name] re: GOLD 4/ 02 dep copd  maint on trelegy  downhill x months / some better p abx from Dr Margo Aye for what  she says was a virus but no ongoing fever, chills but new R flank pain "x months" no pleuritic quality  Chief Complaint  Patient presents with   Follow-up  Dyspnea:  room to room at home 3lpm continuous Cough:  min rattle > clear mucus only  Sleeping: flat bed  2 pillows s resp cc  SABA use: 3 x daily only increases  after 02: 3lpm 24/7 Rec Make sure you check your oxygen saturation  AT  your highest level of activity (not after you stop)   to be sure it stays over 90%   My office will be contacting you by phone for referral to [facility] for best fit for ambulatory 0xygen  Please schedule a follow up office visit in 6 weeks, call sooner if needed with all medications /inhalers/ solutions in hand     Admit date: 07/19/2023 Discharge date: 07/24/2023   Discharge Diagnoses:  Principal Problem:   Acute respiratory failure with hypoxia and hypercapnia (HCC)   COPD with acute exacerbation (HCC)   Chronic diastolic CHF (congestive heart failure) (HCC)   Leukocytosis   Elevated troponin   Essential hypertension   History of UTI   Anxiety        History of present illness:  From admission h and p th medical history significant of hypertension, hyperlipidemia, unspecified CHF, COPD on home oxygen 3-4L, prior history of E. coli bacteremia, anxiety, urinary retention, and tobacco abuse who presented with acutely worsening shortness of breath and weakness.   Recently admitted in the hospital from 7/17-7/19 after having a fall at home with trauma to her head.  Noted to have acute metabolic encephalopathy which was thought secondary to fall with head trauma and had also been found to have concern for urinary tract infection growing E. coli for which patient received 2 days of Rocephin and was discharged home on nitrofurantoin.  She reported completing course of nitrofurantoin as prescribed.  Last night patient reported having progressively worsening shortness of breath yesterday evening.  Reported  having associated symptoms of subjective fever, wheezing, substernal chest pain, and weakness for which she was unable to get up to go to the bathroom.      On EMS  arrival patient noted to have O2 saturations of 67% on 3 L placed on a nonrebreather at 15 L with improvement in O2 saturations up to mid 90s.  Patient had received 2 DuoNeb breathing treatments and Solu-Medrol  125 mg IV.      In the emergency department patient was noted to be afebrile, respirations 15-28, blood pressures elevated up to 179/93, and O2 saturations currently maintained on 3 L of nasal cannula oxygen.  Labs significant for WBC 13, hemoglobin 11, CO2 39, glucose 152, calcium 8.7, BNP 425.4, high-sensitivity troponin 36->32. Venous blood gas significant for pH 7.4, pCO2 83.8, and O2 95.  Chest x-ray noted similar appearance with increased interstitial prominence and diffuse peribronchial cuffing suggestive of background acute bronchitis.  Patient has been given breathing treatments, magnesium sulfate 2 g IV, Lasix 40 mg IV, and fentanyl 50 mcg IV.     Hospital Course:  Patient was treated with acute exacerbation of copd complicated by acute on chronic hypoxic hypercarbic respiratory failure. Treated with IV steroids and ceftriaxone/azithromycin. Symptomatically much improved. Weaned down to home 3 liters at rest. PT advised SNF but patient declines, will order home health PT/OT instead. Patient ambulated unassisted day of discharge and deems herself close to her baseline, requests discharge today.  Other chronic conditions stable. Will discharge home with oral steroid taper and close pcp/pulm f/u.       08/14/2023  f/u ov/[place] office/[name] re: GOLD 4 COPD/02 dep  maint on trelegy 100 but  did not  bring meds  Chief Complaint  Patient presents with   COPD    Gold 4  Dyspnea:  best ever felt on prednisone x5  days p discharge and slowly downhill since/ does not recall taking macrodantin Cough: none  Sleeping: flat bed 2  pillows no resp cc  SABA use: tid inhalation / neb  02: 3.5 conc at hs  and daily  on 4 lpm POC out  Rec Plan A = Automatic = Always=    Trelegy 100 one click each  tug hard to get it going and then full dep breath  Plan B = Backup (to supplement plan A, not to replace it) Only use your albuterol inhaler as a rescue medication Plan C = Crisis (instead of Plan B but only if Plan B stops working) - only use your albuterol nebulizer if you first try Plan B Also  Ok to try albuterol 15 min before an activity (on alternating days)  that you know would usually make you short of breath  Make sure you check your oxygen saturation  AT  your highest level of activity (not after you stop)   to be sure it stays over 90% Cipro 500 mg twice daily x 10 day   Prednisone 10 mg take  4 each am x 2 days,   2 each am x 2 days,  1 each am x 2 days and stop   referral to renal ultrasound  > nl  Please schedule a follow up office visit in 6 weeks, call sooner if needed with all medications /inhalers/ solutions in hand  Add:  My office will be contacting you by phone for referral to ambulatory 02 titration for BEST FIT for portable 02    10/12/2023  f/u ov/[place] office/[name] re: *** maint on *** did *** bring meds  No chief complaint on file.   Dyspnea:  *** Cough: *** Sleeping: ***   resp cc  SABA use: *** 02: ***  Lung cancer screening: ***   No obvious day to day or daytime variability or assoc excess/ purulent sputum or mucus plugs or hemoptysis or cp or chest tightness, subjective wheeze or overt sinus or hb symptoms.    Also  denies any obvious fluctuation of symptoms with weather or environmental changes or other aggravating or alleviating factors except as outlined above   No unusual exposure hx or h/o childhood pna/ asthma or knowledge of premature birth.  Current Allergies, Complete Past Medical History, Past Surgical History, Family History, and Social History were reviewed in [employer] record.  ROS  The following are not active complaints unless bolded Hoarseness, sore throat, dysphagia, dental problems, itching, sneezing,  nasal congestion or discharge of excess mucus or purulent secretions, ear ache,   fever, chills, sweats, unintended wt loss or wt gain, classically pleuritic or exertional cp,  orthopnea pnd or arm/hand swelling  or leg swelling, presyncope, palpitations, abdominal pain, anorexia, nausea, vomiting, diarrhea  or change in bowel habits or change in bladder habits, change in stools or change in urine, dysuria, hematuria,  rash, arthralgias, visual complaints, headache, numbness, weakness or ataxia or problems with walking or coordination,  change in mood or  memory.        No outpatient medications have been marked as taking for the 10/12/23 encounter (Appointment) with Nyoka Cowden, MD.               Past Medical History:  Diagnosis Date   Anxiety    Atrophic vaginitis 10/09/2015   Baker's cyst    left leg   Blood in urine 10/09/2015   CHF (congestive heart failure) (HCC)    COPD (chronic obstructive pulmonary disease) (HCC)    Heart murmur    Hematuria 10/09/2015   Hypertension    Nicotine addiction 04/07/2014   Pneumonia    Urinary urgency 01/25/2016   Vaginal bleeding 10/09/2015      Objective:    Wts  10/12/2023        ***  08/14/2023        185  06/30/2023          180  10/18/22 175 lb 12.8 oz (79.7 kg)  08/08/22 170 lb (77.1 kg)  07/26/22 170 lb 3.2 oz (77.2 kg)   Vital signs reviewed  10/12/2023  - Note at rest 02 sats  ***% on ***   General appearance:    ***   Mod bar***     I personally reviewed images and agree with radiology impression as follows:   Chest CTa   07/23/23    No definite evidence of pulmonary embolus.   Stable chronic findings most likely representing scarring or atelectasis seen in right lung, as well as calcified pleural plaques in right lung base consistent with asbestos  exposure.   Aortic Atherosclerosis (ICD10-I70.0) and Emphysema (ICD10-J43.9).       I personally reviewed images and agree with radiology impression as follows:  CXR:   portable 10/07/23 Small bilateral pleural effusions with associated atelectasis/airspace disease.   Assessment

## 2023-10-11 NOTE — Progress Notes (Signed)
Speech Language Pathology Treatment: Dysphagia  Patient Details Name: [name] MRN: [id number] DOB: 1951-02-01 Today's Date: 10/11/2023 Time: 0955-1010 SLP Time Calculation (min) (ACUTE ONLY): 15 min  Assessment / Plan / Recommendation Clinical Impression  Patient seen by SLP for skilled treatment focused on dysphagia goals. Patient was drowsy but alert. Per RN, she has been hollering out for help a lot and remains confused. Patient was agreeable to PO's from breakfast tray (mechanical soft solids), thin liquids. When [facility] elevated, she constantly requests "get it off" and when asked to clarify says "its right here" (pointing to lower back). She was able to eat PO's with some assistance for alertness and attention secondary to distraction from her c/o discomfort. Mildly prolonged mastication observed but no overt s/s aspiration with solid or thin liquid PO's. SLP then reclined HOB and adjusted pillow which did result in her ceasing her c/o pain/discomfort. When almost fully reclined she requested "I want my breakfast". SLP to s/o at this time as patient appears to be tolerating PO's and main hindrance is her level of alertness. She is safe to remain on Dys 3 (mechanical soft) solids, thin liquids with supervision and assistance while she is still not fully alert. She can be advanced to regular solids by MD without SLP order if desired and when patient is fully awake/alert.    HPI HPI: Patient is a 72 y.o. female with PMH: anxiety, CHF, COPD, HTN, GERD, heart murmur who presented to the hospital on 10/07/2023 after calling EMS from home due to increased WOB, oxygen saturations 50% on her baseline supplemental oxygen of 2L. She was given breathing treatments by EMS, brought to the hospital and started on BiPAP. She had worsening of her ABG; persistent hypercapnia despite ongoing BiPAP use. On 10/14 she was given Ativan and Precedex secondary to agitation overnight. SLP swallow evaluation ordered to  determine patient's readiness to initiate PO diet.      SLP Plan  All goals met      Recommendations for follow up therapy are one component of a multi-disciplinary discharge planning process, led by the attending physician.  Recommendations may be updated based on patient status, additional functional criteria and insurance authorization.    Recommendations  Diet recommendations: Dysphagia 3 (mechanical soft);Thin liquid Liquids provided via: Cup;Straw Medication Administration: Whole meds with puree Supervision: Patient able to self feed;Intermittent supervision to cue for compensatory strategies Compensations: Slow rate;Small sips/bites;Minimize environmental distractions Postural Changes and/or Swallow Maneuvers: Seated upright 90 degrees                  Oral care BID   None Dysphagia, unspecified (R13.10)     All goals met    Angela Nevin, MA, CCC-SLP Speech Therapy

## 2023-10-11 NOTE — Progress Notes (Addendum)
eLink Physician-Brief Progress Note Patient Name: CIARRAH RAE DOB: [birth year] MRN: [id number]   Date of Service  10/11/2023  HPI/Events of Note  Norepinephrine now at 10 mcg Seen on BiPap Fluid negative 3 liters the past 2 days  eICU Interventions  Repeat ABG Discussed with BSRN to hold diuresis for now pending CXR. May need to give fluids     Intervention Category Intermediate Interventions: Hypotension - evaluation and management  Darl Pikes 10/11/2023, 5:22 AM

## 2023-10-11 NOTE — Plan of Care (Signed)
  Problem: Education: Goal: Knowledge of General Education information will improve Description: Including pain rating scale, medication(s)/side effects and non-pharmacologic comfort measures Outcome: Progressing   Problem: Health Behavior/Discharge Planning: Goal: Ability to manage health-related needs will improve Outcome: Progressing   Problem: Clinical Measurements: Goal: Ability to maintain clinical measurements within normal limits will improve Outcome: Progressing Goal: Will remain free from infection Outcome: Progressing Goal: Diagnostic test results will improve Outcome: Progressing Goal: Respiratory complications will improve Outcome: Progressing Goal: Cardiovascular complication will be avoided Outcome: Progressing   Problem: Activity: Goal: Risk for activity intolerance will decrease Outcome: Progressing   Problem: Nutrition: Goal: Adequate nutrition will be maintained Outcome: Progressing   Problem: Coping: Goal: Level of anxiety will decrease Outcome: Progressing   Problem: Elimination: Goal: Will not experience complications related to bowel motility Outcome: Progressing Goal: Will not experience complications related to urinary retention Outcome: Progressing   Problem: Pain Managment: Goal: General experience of comfort will improve Outcome: Progressing   Problem: Safety: Goal: Ability to remain free from injury will improve Outcome: Progressing   Problem: Skin Integrity: Goal: Risk for impaired skin integrity will decrease Outcome: Progressing   Problem: Activity: Goal: Ability to tolerate increased activity will improve Outcome: Progressing   Problem: Clinical Measurements: Goal: Ability to maintain a body temperature in the normal range will improve Outcome: Progressing   Problem: Respiratory: Goal: Ability to maintain adequate ventilation will improve Outcome: Progressing Goal: Ability to maintain a clear airway will improve Outcome:  Progressing   Problem: Education: Goal: Knowledge of disease or condition will improve Outcome: Progressing Goal: Knowledge of the prescribed therapeutic regimen will improve Outcome: Progressing Goal: Individualized Educational Video(s) Outcome: Progressing   Problem: Activity: Goal: Ability to tolerate increased activity will improve Outcome: Progressing Goal: Will verbalize the importance of balancing activity with adequate rest periods Outcome: Progressing   Problem: Respiratory: Goal: Ability to maintain a clear airway will improve Outcome: Progressing Goal: Levels of oxygenation will improve Outcome: Progressing Goal: Ability to maintain adequate ventilation will improve Outcome: Progressing   Problem: Safety: Goal: Non-violent Restraint(s) Outcome: Progressing

## 2023-10-12 ENCOUNTER — Inpatient Hospital Stay (HOSPITAL_COMMUNITY): Payer: Medicare Other

## 2023-10-12 ENCOUNTER — Ambulatory Visit: Payer: Medicare Other | Admitting: Internal Medicine

## 2023-10-12 DIAGNOSIS — J9601 Acute respiratory failure with hypoxia: Secondary | ICD-10-CM | POA: Diagnosis not present

## 2023-10-12 DIAGNOSIS — J9612 Chronic respiratory failure with hypercapnia: Secondary | ICD-10-CM | POA: Diagnosis not present

## 2023-10-12 DIAGNOSIS — I4892 Unspecified atrial flutter: Secondary | ICD-10-CM

## 2023-10-12 DIAGNOSIS — J441 Chronic obstructive pulmonary disease with (acute) exacerbation: Secondary | ICD-10-CM | POA: Diagnosis not present

## 2023-10-12 LAB — BASIC METABOLIC PANEL
Anion gap: 7 (ref 5–15)
BUN: 30 mg/dL — ABNORMAL HIGH (ref 8–23)
CO2: 42 mmol/L — ABNORMAL HIGH (ref 22–32)
Calcium: 8.3 mg/dL — ABNORMAL LOW (ref 8.9–10.3)
Chloride: 89 mmol/L — ABNORMAL LOW (ref 98–111)
Creatinine, Ser: 0.59 mg/dL (ref 0.44–1.00)
GFR, Estimated: >60 mL/min (ref >60)
Glucose, Bld: 140 mg/dL — ABNORMAL HIGH (ref 70–99)
Potassium: 3.8 mmol/L (ref 3.5–5.1)
Sodium: 138 mmol/L (ref 135–145)

## 2023-10-12 LAB — TROPONIN I (HIGH SENSITIVITY): Troponin I (High Sensitivity): 36 ng/L — ABNORMAL HIGH (ref <18)

## 2023-10-12 LAB — ECHOCARDIOGRAM COMPLETE
AV Mean grad: 8 mm[Hg]
AV Peak grad: 16.5 mm[Hg]
Ao pk vel: 2.03 m/s
Area-P 1/2: 3.21 cm2
Height: 63 in
MV M vel: 1.6 m/s
MV Peak grad: 10.2 mm[Hg]
Weight: 2952.4 [oz_av]

## 2023-10-12 LAB — CBC
HCT: 34.8 % — ABNORMAL LOW (ref 36.0–46.0)
Hemoglobin: 10.2 g/dL — ABNORMAL LOW (ref 12.0–15.0)
MCH: 30 pg (ref 26.0–34.0)
MCHC: 29.3 g/dL — ABNORMAL LOW (ref 30.0–36.0)
MCV: 102.4 fL — ABNORMAL HIGH (ref 80.0–100.0)
Platelets: 161 10*3/uL (ref 150–400)
RBC: 3.4 MIL/uL — ABNORMAL LOW (ref 3.87–5.11)
RDW: 13.3 % (ref 11.5–15.5)
WBC: 10.1 10*3/uL (ref 4.0–10.5)
nRBC: 0 % (ref 0.0–0.2)

## 2023-10-12 LAB — MAGNESIUM: Magnesium: 2.2 mg/dL (ref 1.7–2.4)

## 2023-10-12 LAB — PHOSPHORUS: Phosphorus: 3.1 mg/dL (ref 2.5–4.6)

## 2023-10-12 MED ORDER — CLONAZEPAM 0.25 MG PO TBDP: 0.2500 mg | ORAL_TABLET | Freq: Two times a day (BID) | ORAL | Status: DC

## 2023-10-12 MED ORDER — ACETAZOLAMIDE SODIUM 500 MG IJ SOLR
500.0000 mg | Freq: Once | INTRAMUSCULAR | Status: AC
  Administered 2023-10-12: 500 mg via INTRAVENOUS
  Filled 2023-10-12: qty 500

## 2023-10-12 MED ORDER — ACETAZOLAMIDE SODIUM 500 MG IJ SOLR
500.0000 mg | Freq: Two times a day (BID) | INTRAMUSCULAR | Status: AC
  Administered 2023-10-12: 500 mg via INTRAVENOUS
  Filled 2023-10-12 (×2): qty 500

## 2023-10-12 MED ORDER — METOPROLOL TARTRATE 12.5 MG HALF TABLET
12.5000 mg | ORAL_TABLET | Freq: Two times a day (BID) | ORAL | Status: DC
  Administered 2023-10-12 – 2023-10-15 (×7): 12.5 mg via ORAL
  Filled 2023-10-12 (×7): qty 1

## 2023-10-12 MED ORDER — POTASSIUM PHOSPHATES 15 MMOLE/5ML IV SOLN
15.0000 mmol | Freq: Once | INTRAVENOUS | Status: AC
  Administered 2023-10-12: 15 mmol via INTRAVENOUS
  Filled 2023-10-12: qty 5

## 2023-10-12 MED ORDER — METOPROLOL TARTRATE 5 MG/5ML IV SOLN: 2.5000 mg | INTRAVENOUS | Status: DC | PRN

## 2023-10-12 MED ORDER — ENOXAPARIN SODIUM 40 MG/0.4ML IJ SOSY
40.0000 mg | PREFILLED_SYRINGE | INTRAMUSCULAR | Status: DC
  Administered 2023-10-13 – 2023-10-17 (×5): 40 mg via SUBCUTANEOUS
  Filled 2023-10-12 (×5): qty 0.4

## 2023-10-12 NOTE — Progress Notes (Signed)
   10/12/23 2037  BiPAP/CPAP/SIPAP  $ Non-Invasive Ventilator  Non-Invasive Vent Subsequent  BiPAP/CPAP/SIPAP Pt Type Adult  BiPAP/CPAP/SIPAP V60  Mask Type Full face mask  Mask Size Small  Set Rate 18 breaths/min  Respiratory Rate 22 breaths/min  EPAP 5 cmH2O  FiO2 (%) (S)  50 %  Minute Ventilation 14.6  Leak 31  Peak Inspiratory Pressure (PIP) 12  Tidal Volume (Vt) 459  Patient Home Equipment No  Auto Titrate No  Press High Alarm 25 cmH2O  Press Low Alarm 5 cmH2O  Nasal massage performed Yes  CPAP/SIPAP surface wiped down Yes  BiPAP/CPAP /SiPAP Vitals  Bilateral Breath Sounds Diminished

## 2023-10-12 NOTE — Plan of Care (Signed)
  Problem: Education: Goal: Knowledge of General Education information will improve Description: Including pain rating scale, medication(s)/side effects and non-pharmacologic comfort measures Outcome: Not Progressing   Problem: Health Behavior/Discharge Planning: Goal: Ability to manage health-related needs will improve Outcome: Not Progressing   Problem: Clinical Measurements: Goal: Ability to maintain clinical measurements within normal limits will improve Outcome: Not Progressing Goal: Will remain free from infection Outcome: Not Progressing Goal: Diagnostic test results will improve Outcome: Not Progressing Goal: Respiratory complications will improve Outcome: Not Progressing Goal: Cardiovascular complication will be avoided Outcome: Not Progressing   Problem: Activity: Goal: Risk for activity intolerance will decrease Outcome: Not Progressing   Problem: Nutrition: Goal: Adequate nutrition will be maintained Outcome: Not Progressing   Problem: Coping: Goal: Level of anxiety will decrease Outcome: Not Progressing   Problem: Elimination: Goal: Will not experience complications related to bowel motility Outcome: Not Progressing Goal: Will not experience complications related to urinary retention Outcome: Not Progressing   Problem: Pain Managment: Goal: General experience of comfort will improve Outcome: Not Progressing   Problem: Safety: Goal: Ability to remain free from injury will improve Outcome: Not Progressing   Problem: Skin Integrity: Goal: Risk for impaired skin integrity will decrease Outcome: Not Progressing   Problem: Activity: Goal: Ability to tolerate increased activity will improve Outcome: Not Progressing   Problem: Clinical Measurements: Goal: Ability to maintain a body temperature in the normal range will improve Outcome: Not Progressing   Problem: Respiratory: Goal: Ability to maintain adequate ventilation will improve Outcome: Not  Progressing Goal: Ability to maintain a clear airway will improve Outcome: Not Progressing   Problem: Education: Goal: Knowledge of disease or condition will improve Outcome: Not Progressing Goal: Knowledge of the prescribed therapeutic regimen will improve Outcome: Not Progressing Goal: Individualized Educational Video(s) Outcome: Not Progressing   Problem: Activity: Goal: Ability to tolerate increased activity will improve Outcome: Not Progressing Goal: Will verbalize the importance of balancing activity with adequate rest periods Outcome: Not Progressing   Problem: Respiratory: Goal: Ability to maintain a clear airway will improve Outcome: Not Progressing Goal: Levels of oxygenation will improve Outcome: Not Progressing Goal: Ability to maintain adequate ventilation will improve Outcome: Not Progressing   Problem: Safety: Goal: Non-violent Restraint(s) Outcome: Not Progressing   Problem: Education: Goal: Understanding of post-operative needs will improve Outcome: Not Progressing Goal: Individualized Educational Video(s) Outcome: Not Progressing   Problem: Clinical Measurements: Goal: Postoperative complications will be avoided or minimized Outcome: Not Progressing   Problem: Respiratory: Goal: Will regain and/or maintain adequate ventilation Outcome: Not Progressing

## 2023-10-12 NOTE — Progress Notes (Addendum)
eLink Physician-Brief Progress Note Patient Name: RAZIEL LIBERMAN DOB: September 23, 1951 MRN: [id number]   Date of Service  10/12/2023  HPI/Events of Note  72 year old female here with acute on chronic hypoxic/hypercapnic respiratory failure in the setting of acute COPD exacerbation and community-acquired pneumonia  Intermittently agitated and trying to get out of bed.  eICU Interventions  Renew Posey vest order   0541 -AM gas reviewed.  No changes indicated at this time.  Intervention Category Minor Interventions: Clinical assessment - ordering diagnostic tests  Alesia Oshields 10/12/2023, 9:11 PM

## 2023-10-12 NOTE — Progress Notes (Addendum)
NAME:  [name], MRN:  [id number], DOB:  July 13, 1951, LOS: 5 ADMISSION DATE:  10/07/2023, CONSULTATION DATE:  10/07/2023 REFERRING MD:  Dr Jeraldine Loots, CHIEF COMPLAINT: Hypoxemic respiratory failure  History of Present Illness:  Activated EMS for not feeling well Has been started on antibiotics and steroids by Dr. Sherene Sires on 10/10 Activated EMS with increased work of breathing, saturations noted to be 50% on her usual 2 L of oxygen was given Breathing treatments and brought to the hospital Started on BiPAP Worsening ABG  Pertinent  Medical History   Past Medical History:  Diagnosis Date   Anxiety    Atrophic vaginitis 10/09/2015   Baker's cyst    left leg   Blood in urine 10/09/2015   CHF (congestive heart failure) (HCC)    COPD (chronic obstructive pulmonary disease) (HCC)    Heart murmur    Hematuria 10/09/2015   Hypertension    Nicotine addiction 04/07/2014   Oxygen dependent    4L/min [place] all the time   Pneumonia    Urinary retention 07/01/2020   Urinary urgency 01/25/2016   Vaginal bleeding 10/09/2015     Significant Hospital Events: Including procedures, antibiotic start and stop dates in addition to other pertinent events   10/12 acute on chronic hypoxic respiratory failure with failed outpatient therapies including steroids  CT scan chest 07/23/2023-pleural calcification, fibrosis, emphysema PFT 06/02/2016 severe obstructive lung disease ABG 10/07/2023 7.21/123/167 10/13 persistent hypercapnia despite ongoing BiPAP use 10/14 was apparently agitated overnight and received prn Ativan and on precedex, sedated on bipap  10/16 early AM, BiPAP changed to AVAPS to target MV 12-15  Interim History / Subjective:  More calm this AM. But apparently refused BiPAP overnight. Had A.fib RVR overnight,  given adenosine and dig then transitioned to Diltiazem infusion.   Objective   Blood pressure (!) 130/49, pulse 83, temperature 98.1 F (36.7 C), temperature source  Axillary, resp. rate 13, height 5\' 3"  (1.6 m), weight 83.7 kg, SpO2 98%.    FiO2 (%):  [40 %] 40 %   Intake/Output Summary (Last 24 hours) at 10/12/2023 0814 Last data filed at 10/12/2023 5784 Gross per 24 hour  Intake 571.37 ml  Output 1680 ml  Net -1108.63 ml   Filed Weights   10/10/23 0158 10/11/23 0500 10/12/23 0500  Weight: 86.3 kg 86.3 kg 83.7 kg    Physical exam: General: Acute on chronically ill-appearing female, resting in bed eating breakfast. HEENT: [place]/AT, eyes anicteric.  EOMI. Neuro: Awake. Slow to follow commands. Chest: Normal effort. Clear bilaterally. Heart: Regular rate and rhythm, no murmurs or gallops. Abdomen: Soft, nontender, nondistended, bowel sounds present. Skin: No rash.   Assessment & Plan:   Acute on chronic hypoxemic/hypercapnic respiratory failure Community-acquired pneumonia Advanced chronic obstructive pulmonary disease with acute exacerbation Acute metabolic encephalopathy in the setting of hypercapnia Continue BiPAP at bedtime and PRN (refused overnight) Repeat Acetazolamide today Continue Yupelri, Brovana, pulmicort Hold additional steroids for now Continue Cefepime  Agitation and ICU delirium Continue Klonopin, Seroquel Avoid ongoing sedation as able  Acute on chronic diastolic congestive heart failure -Echocardiogram June 2024 EF of 55 to 60% with no WMA and grade 2 diastolic dysfunction Essential hypertension - now hypotensive on low dose Levo  Hyperlipidemia Hold further lasix to avoid further contraction alkalosis  Monitor intake and output D/c Losartan as has hypotension requiring low dose Levophed GDMT on hold for now Continue statin  Obesity Diet and exercise counseling as appropriate Dysphagia 3 diet  AKI Hold further Lasix  today Follow BMP  A.fib with RVR  - received Adenosine, Dig, Diltiazem - D/c Diltiazem - Add low dose Lopressor PO - Lopressor PRN for sustained HR > 120 - Ok to drop Lovenox back to  prophylactic dose vs treatment dose  Best Practice (right click and "Reselect all SmartList Selections" daily)   Diet/type: Dysphagia 3 DVT prophylaxis: LMWH GI prophylaxis: PPI Lines: N/A Foley:  N/A Code Status:  full code Last date of multidisciplinary goals of care discussion [pending],  CC time: 30 min.   Rutherford Guys, PA - [facility] For pager details, please see AMION or use Epic chat  After 1900, please call [facility] for cross coverage needs 10/12/2023, 8:14 AM

## 2023-10-12 NOTE — Plan of Care (Signed)
  Problem: Education: Goal: Knowledge of General Education information will improve Description: Including pain rating scale, medication(s)/side effects and non-pharmacologic comfort measures Outcome: Progressing   Problem: Health Behavior/Discharge Planning: Goal: Ability to manage health-related needs will improve Outcome: Progressing   Problem: Clinical Measurements: Goal: Ability to maintain clinical measurements within normal limits will improve Outcome: Progressing Goal: Will remain free from infection Outcome: Progressing Goal: Diagnostic test results will improve Outcome: Progressing Goal: Respiratory complications will improve Outcome: Progressing Goal: Cardiovascular complication will be avoided Outcome: Progressing   Problem: Activity: Goal: Risk for activity intolerance will decrease Outcome: Progressing   Problem: Nutrition: Goal: Adequate nutrition will be maintained Outcome: Progressing   Problem: Coping: Goal: Level of anxiety will decrease Outcome: Progressing   Problem: Elimination: Goal: Will not experience complications related to bowel motility Outcome: Progressing Goal: Will not experience complications related to urinary retention Outcome: Progressing   Problem: Pain Managment: Goal: General experience of comfort will improve Outcome: Progressing   Problem: Safety: Goal: Ability to remain free from injury will improve Outcome: Progressing   Problem: Skin Integrity: Goal: Risk for impaired skin integrity will decrease Outcome: Progressing   Problem: Activity: Goal: Ability to tolerate increased activity will improve Outcome: Progressing   Problem: Clinical Measurements: Goal: Ability to maintain a body temperature in the normal range will improve Outcome: Progressing   Problem: Respiratory: Goal: Ability to maintain adequate ventilation will improve Outcome: Progressing Goal: Ability to maintain a clear airway will improve Outcome:  Progressing   Problem: Education: Goal: Knowledge of disease or condition will improve Outcome: Progressing Goal: Knowledge of the prescribed therapeutic regimen will improve Outcome: Progressing Goal: Individualized Educational Video(s) Outcome: Progressing   Problem: Activity: Goal: Ability to tolerate increased activity will improve Outcome: Progressing Goal: Will verbalize the importance of balancing activity with adequate rest periods Outcome: Progressing   Problem: Respiratory: Goal: Ability to maintain a clear airway will improve Outcome: Progressing Goal: Levels of oxygenation will improve Outcome: Progressing Goal: Ability to maintain adequate ventilation will improve Outcome: Progressing   Problem: Safety: Goal: Non-violent Restraint(s) Outcome: Progressing   Problem: Education: Goal: Understanding of post-operative needs will improve Outcome: Progressing Goal: Individualized Educational Video(s) Outcome: Progressing   Problem: Clinical Measurements: Goal: Postoperative complications will be avoided or minimized Outcome: Progressing   Problem: Respiratory: Goal: Will regain and/or maintain adequate ventilation Outcome: Progressing

## 2023-10-12 NOTE — Progress Notes (Signed)
   10/12/23 0445  BiPAP/CPAP/SIPAP  $ Non-Invasive Ventilator  Non-Invasive Vent Subsequent  BiPAP/CPAP/SIPAP Pt Type Adult  BiPAP/CPAP/SIPAP V60  Mask Type Full face mask  Mask Size Small  Set Rate 20 breaths/min  Respiratory Rate 18 breaths/min  EPAP 5 cmH2O  FiO2 (%) 40 %  Flow Rate 0 lpm  Minute Ventilation 4.8  Leak 20  Peak Inspiratory Pressure (PIP) 13  Tidal Volume (Vt) 500  Patient Home Equipment No  Auto Titrate No  Press High Alarm 50 cmH2O  Press Low Alarm 5 cmH2O  CPAP/SIPAP surface wiped down Yes  BiPAP/CPAP /SiPAP Vitals  SpO2 97 %  Bilateral Breath Sounds Clear;Diminished

## 2023-10-12 NOTE — Progress Notes (Signed)
Echocardiogram 2D Echocardiogram has been performed.  Lucendia Herrlich 10/12/2023, 11:59 AM

## 2023-10-13 DIAGNOSIS — J9601 Acute respiratory failure with hypoxia: Secondary | ICD-10-CM | POA: Diagnosis not present

## 2023-10-13 DIAGNOSIS — J9612 Chronic respiratory failure with hypercapnia: Secondary | ICD-10-CM | POA: Diagnosis not present

## 2023-10-13 LAB — CBC
HCT: 36.2 % (ref 36.0–46.0)
Hemoglobin: 10.1 g/dL — ABNORMAL LOW (ref 12.0–15.0)
MCH: 28.3 pg (ref 26.0–34.0)
MCHC: 27.9 g/dL — ABNORMAL LOW (ref 30.0–36.0)
MCV: 101.4 fL — ABNORMAL HIGH (ref 80.0–100.0)
Platelets: 140 10*3/uL — ABNORMAL LOW (ref 150–400)
RBC: 3.57 MIL/uL — ABNORMAL LOW (ref 3.87–5.11)
RDW: 13.1 % (ref 11.5–15.5)
WBC: 7.4 10*3/uL (ref 4.0–10.5)
nRBC: 0 % (ref 0.0–0.2)

## 2023-10-13 LAB — CALCIUM, IONIZED: Calcium, Ionized, Serum: 4.4 mg/dL — ABNORMAL LOW (ref 4.5–5.6)

## 2023-10-13 LAB — MAGNESIUM: Magnesium: 2.3 mg/dL (ref 1.7–2.4)

## 2023-10-13 LAB — PHOSPHORUS: Phosphorus: 3.5 mg/dL (ref 2.5–4.6)

## 2023-10-13 LAB — BASIC METABOLIC PANEL
Anion gap: 7 (ref 5–15)
BUN: 24 mg/dL — ABNORMAL HIGH (ref 8–23)
CO2: 43 mmol/L — ABNORMAL HIGH (ref 22–32)
Calcium: 8.8 mg/dL — ABNORMAL LOW (ref 8.9–10.3)
Chloride: 90 mmol/L — ABNORMAL LOW (ref 98–111)
Creatinine, Ser: 0.58 mg/dL (ref 0.44–1.00)
GFR, Estimated: >60 mL/min (ref >60)
Glucose, Bld: 101 mg/dL — ABNORMAL HIGH (ref 70–99)
Potassium: 4.2 mmol/L (ref 3.5–5.1)
Sodium: 140 mmol/L (ref 135–145)

## 2023-10-13 MED ORDER — ACETAMINOPHEN 325 MG PO TABS
650.0000 mg | ORAL_TABLET | Freq: Four times a day (QID) | ORAL | Status: DC | PRN
  Administered 2023-10-13 – 2023-10-16 (×6): 650 mg via ORAL
  Filled 2023-10-13 (×6): qty 2

## 2023-10-13 NOTE — Evaluation (Signed)
Physical Therapy Evaluation Patient Details Name: [name] MRN: [id number] DOB: November 30, 1951 Today's Date: 10/13/2023  History of Present Illness  72 yo female admitted on 10/12 with acute on chronic hypercarbic and hypoxic resp failure, in setting of COPD and PNA. Hospital course complicated by ICU delirium.  PMHx: COPD, CHF, bronchiectasis, pulm nodules, nicotine addiction, heart murmur, pyelonephritis, e coli, bacteremia, HTN.  Clinical Impression  Patient presents with decreased mobility due to deficits listed in PT problem list.  Currently +2 for safety for short in room ambulation but poorly tolerated with SpO2 into 60's while on 2L O2.  Recovered on 4L to 90's while seated then transitioned to reliner.  Feel she will need follow up inpatient rehab (<3 hours/day) prior to d/c home.          If plan is discharge home, recommend the following: A lot of help with walking and/or transfers;A lot of help with bathing/dressing/bathroom;Help with stairs or ramp for entrance;Assist for transportation;Assistance with cooking/housework   Can travel by private vehicle   No    Equipment Recommendations None recommended by PT  Recommendations for Other Services       Functional Status Assessment Patient has had a recent decline in their functional status and demonstrates the ability to make significant improvements in function in a reasonable and predictable amount of time.     Precautions / Restrictions Precautions Precautions: Fall Precaution Comments: watch sats Restrictions Weight Bearing Restrictions: No      Mobility  Bed Mobility Overal bed mobility: Needs Assistance Bed Mobility: Supine to Sit     Supine to sit: HOB elevated, Supervision          Transfers Overall transfer level: Needs assistance Equipment used: Rolling walker (2 wheels) Transfers: Sit to/from Stand Sit to Stand: +2 safety/equipment, Min assist           General transfer comment: assist  to rise and steady from bed and chair, pulls up and does not release walker with sit<>stand, poor safety awareness requiring guidance of hips to avoid sitting on arm of chair    Ambulation/Gait Ambulation/Gait assistance: Mod assist, Min assist, +2 safety/equipment Gait Distance (Feet): 6 Feet Assistive device: Rolling walker (2 wheels) Gait Pattern/deviations: Step-to pattern, Decreased stride length, Wide base of support, Trunk flexed       General Gait Details: increasing proximity from walker trying to walk around bed to recliner,  pt requesting to sit so assisted to small chair and noted SpO2 on 2L 60's, back to 90's on 4L with <2 minutes rest.  Patient on 4L for stand step pivot to recliner; SpO2 89% back on 2L at rest  Stairs            Wheelchair Mobility     Tilt Bed    Modified Rankin (Stroke Patients Only)       Balance Overall balance assessment: Needs assistance   Sitting balance-Leahy Scale: Fair     Standing balance support: Bilateral upper extremity supported, Reliant on assistive device for balance Standing balance-Leahy Scale: Poor                               Pertinent Vitals/Pain Pain Assessment Faces Pain Scale: Hurts little more Pain Location: back Pain Descriptors / Indicators: Aching, Grimacing, Discomfort Pain Intervention(s): Monitored during session, Repositioned    Home Living Family/patient expects to be discharged to:: Private residence Living Arrangements: Children Available Help at Discharge: Family;Available  24 hours/day Type of Home: House Home Access: Stairs to enter Entrance Stairs-Rails: Right;Left;Can reach both Entrance Stairs-Number of Steps: 5   Home Layout: One level Home Equipment: Shower seat;Grab bars - toilet;Grab bars - tub/shower;Rolling Walker (2 wheels);Rollator (4 wheels);Cane - single point;Other (comment) Additional Comments: lives with daughter, [name] who has had a stroke and is preparing  for knee surgery    Prior Function Prior Level of Function : Needs assist             Mobility Comments: walked with no AD or with rollator ADLs Comments: mod I     Extremity/Trunk Assessment   Upper Extremity Assessment Upper Extremity Assessment: Defer to OT evaluation    Lower Extremity Assessment Lower Extremity Assessment: RLE deficits/detail;LLE deficits/detail RLE Deficits / Details: generalized weakness, not formally tested, noted bunions bilaterally with second toe crossing over third LLE Deficits / Details: generalized weakness, not formally tested, noted bunions bilaterally with second toe crossing over third    Cervical / Trunk Assessment Cervical / Trunk Assessment: Other exceptions Cervical / Trunk Exceptions: chronic back pain  Communication   Communication Communication: Difficulty following commands/understanding;Difficulty communicating thoughts/reduced clarity of speech  Cognition Arousal: Alert Behavior During Therapy: Impulsive Overall Cognitive Status: Impaired/Different from baseline Area of Impairment: Orientation, Attention, Memory, Following commands, Safety/judgement, Awareness, Problem solving                 Orientation Level: Disoriented to, Place, Time, Situation Current Attention Level: Focused Memory: Decreased short-term memory, Decreased recall of precautions Following Commands: Follows one step commands inconsistently, Follows one step commands with increased time Safety/Judgement: Decreased awareness of safety, Decreased awareness of deficits Awareness: Intellectual Problem Solving: Slow processing, Decreased initiation, Difficulty sequencing, Requires verbal cues, Requires tactile cues          General Comments General comments (skin integrity, edema, etc.): L arm with edema and ecchymosis throughout, scattered bruises on R arm, attempting to pull out IV that was wrapped on R arm and redirected    Exercises      Assessment/Plan    PT Assessment Patient needs continued PT services  PT Problem List Decreased strength;Decreased activity tolerance;Cardiopulmonary status limiting activity;Decreased balance;Decreased knowledge of use of DME       PT Treatment Interventions DME instruction;Gait training;Functional mobility training;Therapeutic exercise;Therapeutic activities;Balance training;Patient/family education    PT Goals (Current goals can be found in the Care Plan section)  Acute Rehab PT Goals Patient Stated Goal: agreeable to rehab PT Goal Formulation: With patient Time For Goal Achievement: 10/27/23 Potential to Achieve Goals: Fair    Frequency Min 1X/week     Co-evaluation PT/OT/SLP Co-Evaluation/Treatment: Yes Reason for Co-Treatment: For patient/therapist safety;Necessary to address cognition/behavior during functional activity PT goals addressed during session: Mobility/safety with mobility;Balance OT goals addressed during session: ADL's and self-care       AM-PAC PT "6 Clicks" Mobility  Outcome Measure Help needed turning from your back to your side while in a flat bed without using bedrails?: A Little Help needed moving from lying on your back to sitting on the side of a flat bed without using bedrails?: A Little Help needed moving to and from a bed to a chair (including a wheelchair)?: A Lot Help needed standing up from a chair using your arms (e.g., wheelchair or bedside chair)?: A Lot Help needed to walk in hospital room?: Total Help needed climbing 3-5 steps with a railing? : Total 6 Click Score: 12    End of Session Equipment Utilized During  Treatment: Gait belt;Oxygen Activity Tolerance: Treatment limited secondary to medical complications (Comment) Patient left: in chair;with call bell/phone within reach;with chair alarm set Nurse Communication: Mobility status;Other (comment) (drop to 60's on 2L with mobility) PT Visit Diagnosis: Other abnormalities of gait  and mobility (R26.89);Muscle weakness (generalized) (M62.81)    Time: 1610-9604 PT Time Calculation (min) (ACUTE ONLY): 28 min   Charges:   PT Evaluation $PT Eval Moderate Complexity: 1 Mod   PT General Charges $$ ACUTE PT VISIT: 1 Visit         Sheran Lawless, PT Acute Rehabilitation Services [phone] 10/13/2023   Elray Mcgregor 10/13/2023, 12:25 PM

## 2023-10-13 NOTE — Plan of Care (Signed)
CHL Tonsillectomy/Adenoidectomy, Postoperative PEDS care plan entered in error.

## 2023-10-13 NOTE — Progress Notes (Addendum)
NAME:  [name], MRN:  [id number], DOB:  [birth year], LOS: 6 ADMISSION DATE:  10/07/2023, CONSULTATION DATE:  10/07/2023 REFERRING MD:  Dr Jeraldine Loots, CHIEF COMPLAINT: Hypoxemic respiratory failure  History of Present Illness:  Activated EMS for not feeling well Has been started on antibiotics and steroids by Dr. Sherene Sires on 10/10 Activated EMS with increased work of breathing, saturations noted to be 50% on her usual 2 L of oxygen was given Breathing treatments and brought to the hospital Started on BiPAP Worsening ABG  Pertinent  Medical History   Past Medical History:  Diagnosis Date   Anxiety    Atrophic vaginitis 10/09/2015   Baker's cyst    left leg   Blood in urine 10/09/2015   CHF (congestive heart failure) (HCC)    COPD (chronic obstructive pulmonary disease) (HCC)    Heart murmur    Hematuria 10/09/2015   Hypertension    Nicotine addiction 04/07/2014   Oxygen dependent    4L/min [place] all the time   Pneumonia    Urinary retention 07/01/2020   Urinary urgency 01/25/2016   Vaginal bleeding 10/09/2015     Significant Hospital Events: Including procedures, antibiotic start and stop dates in addition to other pertinent events   10/12 acute on chronic hypoxic respiratory failure with failed outpatient therapies including steroids  CT scan chest 07/23/2023-pleural calcification, fibrosis, emphysema PFT 06/02/2016 severe obstructive lung disease ABG 10/07/2023 7.21/123/167 10/13 persistent hypercapnia despite ongoing BiPAP use 10/14 was apparently agitated overnight and received prn Ativan and on precedex, sedated on bipap  10/16 early AM, BiPAP changed to AVAPS to target MV 12-15  Interim History / Subjective:  Remains quite sedated. Refusing BiPAP overnight.  Objective   Blood pressure (!) 129/55, pulse 88, temperature 98.6 F (37 C), temperature source Axillary, resp. rate 18, height 5\' 3"  (1.6 m), weight 88.3 kg, SpO2 90%.    FiO2 (%):  [40 %-50 %] 50 %    Intake/Output Summary (Last 24 hours) at 10/13/2023 0814 Last data filed at 10/13/2023 0600 Gross per 24 hour  Intake 60 ml  Output 615 ml  Net -555 ml   Filed Weights   10/11/23 0500 10/12/23 0500 10/13/23 0500  Weight: 86.3 kg 83.7 kg 88.3 kg    Physical exam: General: Acute on chronically ill-appearing female, resting in bed, in NAD. HEENT: [place]/AT, eyes anicteric.  EOMI. Neuro: Somnolent, very sleepy but wakes up to voice then doses back off. Chest: Normal effort. Clear bilaterally. Heart: Regular rate and rhythm, no murmurs or gallops. Abdomen: Soft, nontender, nondistended, bowel sounds present. Skin: No rash.   Assessment & Plan:   Acute on chronic hypoxemic/hypercapnic respiratory failure Community-acquired pneumonia - s/p 5 days Cefepime Advanced chronic obstructive pulmonary disease with acute exacerbation Acute metabolic encephalopathy in the setting of hypercapnia Presumed OSA/OHS Continue BiPAP at bedtime and PRN (refused overnight). Tried to stress importance of this Continue supplemental O2 as needed to maintain SpO2 > 88% (would NOT push for high 90s given her chronic hypercarbia) Holding further Diamox today Continue Roxy Manns, pulmicort Hold additional steroids for now Will need outpatient sleep study (per chart review, there has been mention of intolerance to CPAP in the past, possibly mask fit related?)  Agitation and ICU delirium D/c Klonopin, Seroquel,  Hold home Xanax, Norco Avoid all sedation as able Use PRN Tylenol instead of Norco for pain  Acute on chronic diastolic congestive heart failure -Echocardiogram June 2024 EF of 55 to 60% with no WMA and grade 2  diastolic dysfunction Essential hypertension - now hypotensive on low dose Levo  Hyperlipidemia Hold further lasix to avoid further contraction alkalosis  Monitor intake and output Holding Losartan as had intermittent hypotension and was started on Lopressor for AFRVR  (resolved) GDMT on hold for now Continue statin  A.fib with RVR  - received Adenosine, Dig, Diltiazem (d/c 10/17) - Continue low dose Lopressor PO - Lopressor PRN for sustained HR > 120 - Ok to continue Lovenox prophylactic dose vs treatment dose  Obesity Diet and exercise counseling as appropriate Dysphagia 3 diet  AKI Hold further Lasix today Follow BMP   Stable for transfer out of ICU to med surg. Will ask TRH to pickup in AM 10/19. Will need to watch for withdrawal after holding/dc her home meds including Xanax and Norco   Best Practice (right click and "Reselect all SmartList Selections" daily)   Diet/type: Dysphagia 3 DVT prophylaxis: LMWH GI prophylaxis: PPI Lines: N/A Foley:  N/A Code Status:  full code Last date of multidisciplinary goals of care discussion [pending],   Rutherford Guys, PA - [facility] For pager details, please see AMION or use Epic chat  After 1900, please call [facility] for cross coverage needs 10/13/2023, 8:14 AM

## 2023-10-13 NOTE — Evaluation (Addendum)
Occupational Therapy Evaluation Patient Details Name: [name] MRN: [id number] DOB: 07/22/1951 Today's Date: 10/13/2023   History of Present Illness 72 yo female admitted on 10/12 with acute on chronic hypercarbic and hypoxic resp failure, in setting of COPD and PNA. Hospital course complicated by ICU delirium.  PMHx: COPD, CHF, bronchiectasis, pulm nodules, nicotine addiction, heart murmur, pyelonephritis, e coli, bacteremia, HTN.   Clinical Impression   Pt was living with her daughter and walking without AD in her home and rollator outside of her home. She reports independence in self care which is confirmed by previous hospitalization documentation. Home information also gathered from previous admission. Pt presents with significant impairment in cognition with focused attention, decreased activity tolerance, generalized weakness and poor balance. She need +2 min assist for short distance ambulation with RW and min to max assist for ADLs. SpO2 noted to drop to 60% on 2 L with ambulation, rebounded quickly to 92% on 4L. Patient will benefit from continued inpatient follow up therapy, <3 hours/day.        If plan is discharge home, recommend the following: Two people to help with walking and/or transfers;A lot of help with bathing/dressing/bathroom;Assistance with cooking/housework;Assistance with feeding;Direct supervision/assist for medications management;Direct supervision/assist for financial management;Assist for transportation;Help with stairs or ramp for entrance    Functional Status Assessment  Patient has had a recent decline in their functional status and demonstrates the ability to make significant improvements in function in a reasonable and predictable amount of time.  Equipment Recommendations  None recommended by OT    Recommendations for Other Services       Precautions / Restrictions Precautions Precautions: Fall Precaution Comments: watch  sats Restrictions Weight Bearing Restrictions: No      Mobility Bed Mobility Overal bed mobility: Needs Assistance Bed Mobility: Supine to Sit     Supine to sit: HOB elevated, Supervision          Transfers Overall transfer level: Needs assistance Equipment used: Rolling walker (2 wheels) Transfers: Sit to/from Stand Sit to Stand: +2 safety/equipment, Min assist           General transfer comment: assist to rise and steady from bed and chair, pulls up and does not release walker with sit<>stand, poor safety awareness requiring guidance of hips to avoid sitting on arm of chair      Balance Overall balance assessment: Needs assistance   Sitting balance-Leahy Scale: Fair       Standing balance-Leahy Scale: Poor                             ADL either performed or assessed with clinical judgement   ADL Overall ADL's : Needs assistance/impaired Eating/Feeding: Sitting;Minimal assistance   Grooming: Oral care;Sitting;Minimal assistance;Brushing hair Grooming Details (indicate cue type and reason): set up and multimodal cues to sequence Upper Body Bathing: Maximal assistance;Sitting   Lower Body Bathing: Maximal assistance;Sit to/from stand   Upper Body Dressing : Moderate assistance;Sitting   Lower Body Dressing: Maximal assistance;Sit to/from stand   Toilet Transfer: Minimal assistance;+2 for physical assistance;Ambulation;Rolling walker (2 wheels)   Toileting- Clothing Manipulation and Hygiene: Maximal assistance;Sit to/from stand       Functional mobility during ADLs: Minimal assistance;+2 for physical assistance;Caregiver able to provide necessary level of assistance;Rolling walker (2 wheels)       Vision Baseline Vision/History: 2 Legally blind Ability to See in Adequate Light: 1 Impaired Patient Visual Report: No change from baseline  Additional Comments: blind in R eye     Perception         Praxis         Pertinent Vitals/Pain  Pain Assessment Pain Assessment: Faces Faces Pain Scale: Hurts little more Pain Location: back Pain Descriptors / Indicators: Aching, Grimacing, Discomfort Pain Intervention(s): Monitored during session, Repositioned     Extremity/Trunk Assessment Upper Extremity Assessment Upper Extremity Assessment: Generalized weakness   Lower Extremity Assessment Lower Extremity Assessment: Defer to PT evaluation   Cervical / Trunk Assessment Cervical / Trunk Assessment: Other exceptions (hx of chronic back pain)   Communication Communication Communication: Difficulty following commands/understanding;Difficulty communicating thoughts/reduced clarity of speech   Cognition Arousal: Alert Behavior During Therapy: Impulsive Overall Cognitive Status: Impaired/Different from baseline Area of Impairment: Orientation, Attention, Memory, Following commands, Safety/judgement, Awareness, Problem solving                 Orientation Level: Disoriented to, Place, Time, Situation Current Attention Level: Focused Memory: Decreased short-term memory, Decreased recall of precautions Following Commands: Follows one step commands inconsistently, Follows one step commands with increased time Safety/Judgement: Decreased awareness of safety, Decreased awareness of deficits Awareness: Intellectual Problem Solving: Slow processing, Decreased initiation, Difficulty sequencing, Requires verbal cues, Requires tactile cues       General Comments       Exercises     Shoulder Instructions      Home Living Family/patient expects to be discharged to:: Private residence Living Arrangements: Children Available Help at Discharge: Family;Available 24 hours/day Type of Home: House Home Access: Stairs to enter [employer] of Steps: 5 Entrance Stairs-Rails: Right;Left;Can reach both Home Layout: One level     Bathroom Shower/Tub: Chief Strategy Officer: Handicapped height     Home  Equipment: Shower seat;Grab bars - toilet;Grab bars - tub/shower;Rolling Walker (2 wheels);Rollator (4 wheels);Cane - single point;Other (comment) (O2)   Additional Comments: lives with daughter, [name]      Prior Functioning/Environment Prior Level of Function : Needs assist             Mobility Comments: walked with no AD or with rollator ADLs Comments: mod I        OT Problem List: Decreased strength;Decreased activity tolerance;Impaired balance (sitting and/or standing);Impaired vision/perception;Decreased cognition;Decreased safety awareness;Decreased knowledge of use of DME or AE;Cardiopulmonary status limiting activity;Pain      OT Treatment/Interventions: Self-care/ADL training;DME and/or AE instruction;Therapeutic activities;Patient/family education;Balance training    OT Goals(Current goals can be found in the care plan section) Acute Rehab OT Goals OT Goal Formulation: With patient Time For Goal Achievement: 10/27/23 Potential to Achieve Goals: Good  OT Frequency: Min 1X/week    Co-evaluation PT/OT/SLP Co-Evaluation/Treatment: Yes Reason for Co-Treatment: For patient/therapist safety;Necessary to address cognition/behavior during functional activity   OT goals addressed during session: ADL's and self-care      AM-PAC OT "6 Clicks" Daily Activity     Outcome Measure Help from another person eating meals?: A Little Help from another person taking care of personal grooming?: A Little Help from another person toileting, which includes using toliet, bedpan, or urinal?: A Lot Help from another person bathing (including washing, rinsing, drying)?: A Lot Help from another person to put on and taking off regular upper body clothing?: A Lot Help from another person to put on and taking off regular lower body clothing?: A Lot 6 Click Score: 14   End of Session Equipment Utilized During Treatment: Rolling walker (2 wheels);Gait belt;Oxygen (2-4L) Nurse Communication:  Mobility  status;Other (comment) (aware of O2 sat with ambulation)  Activity Tolerance: Patient limited by fatigue;Patient limited by pain Patient left: in chair;with call bell/phone within reach;with chair alarm set  OT Visit Diagnosis: Unsteadiness on feet (R26.81);Other abnormalities of gait and mobility (R26.89);Other symptoms and signs involving cognitive function;Pain;Muscle weakness (generalized) (M62.81)                Time: 6440-3474 OT Time Calculation (min): 27 min Charges:  OT General Charges $OT Visit: 1 Visit OT Evaluation $OT Eval Moderate Complexity: 1 Mod  [name], OTR/L Acute Rehabilitation Services Office: [phone]   [name] 10/13/2023, 11:59 AM

## 2023-10-14 DIAGNOSIS — J9601 Acute respiratory failure with hypoxia: Secondary | ICD-10-CM | POA: Diagnosis not present

## 2023-10-14 DIAGNOSIS — J9612 Chronic respiratory failure with hypercapnia: Secondary | ICD-10-CM | POA: Diagnosis not present

## 2023-10-14 MED ORDER — ALPRAZOLAM 0.5 MG PO TABS
0.2500 mg | ORAL_TABLET | Freq: Two times a day (BID) | ORAL | Status: DC
  Administered 2023-10-14 – 2023-10-17 (×7): 0.25 mg via ORAL
  Filled 2023-10-14 (×7): qty 1

## 2023-10-14 MED ORDER — TAMSULOSIN HCL 0.4 MG PO CAPS
0.4000 mg | ORAL_CAPSULE | Freq: Every day | ORAL | Status: DC
  Administered 2023-10-14 – 2023-10-16 (×3): 0.4 mg via ORAL
  Filled 2023-10-14 (×3): qty 1

## 2023-10-14 NOTE — Progress Notes (Signed)
PROGRESS NOTE    IDELL DUQUAINE  BJY:[id number]  DOB: 29-Apr-1951  DOA: 10/07/2023 PCP: Benita Stabile, MD Outpatient Specialists:   Hospital course:  72 year old female with recurrent hypercapnic/hypoxic respiratory failure secondary to severe COPD/emphysema, pulmonary fibrosis and calcification was admitted 10/12 with acute on chronic hypoxic respiratory failure which she has failed outpatient therapy.  Patient was found to have CAP.  Patient was admitted by PCCM to ICU and treated with BiPAP, bronchodilators, oxygen and good pulmonary toilet with improvement of symptoms.  Her course was complicated by agitation and ICU delirium which was treated with as needed Haldol and Ativan.  Patient was transferred out of the ICU today, 10/14/2023   Subjective:  "You have to give me my medicines.  I have to have my alprazolam and my Norco."  Patient was not interested in understanding why these medications were held.  He is demanding to have both of these medications "at once".   Objective: Vitals:   10/13/23 2349 10/14/23 0500 10/14/23 0658 10/14/23 1551  BP: (!) 139/57  (!) 159/55 (!) 162/76  Pulse: 86  82 72  Resp:      Temp: 99.4 F (37.4 C)  98.4 F (36.9 C) 99 F (37.2 C)  TempSrc: Oral  Oral Oral  SpO2: 95%  95% 96%  Weight:  88 kg    Height:        Intake/Output Summary (Last 24 hours) at 10/14/2023 1600 Last data filed at 10/14/2023 1551 Gross per 24 hour  Intake 120 ml  Output 2300 ml  Net -2180 ml   Filed Weights   10/12/23 0500 10/13/23 0500 10/14/23 0500  Weight: 83.7 kg 88.3 kg 88 kg     Exam:  General: Patient is wide-awake, is not confused, is demanding her medications Eyes: sclera anicteric, conjuctiva mild injection bilaterally CVS: S1-S2, regular  Respiratory:  decreased air entry bilaterally secondary to decreased inspiratory effort, rales at bases  GI: NABS, soft, NT  LE: Warm and well-perfused Neuro: A/O x 3,  grossly nonfocal.  Psych: Is  irritable, demanding.  Data Reviewed:  Basic Metabolic Panel: Recent Labs  Lab 10/10/23 0334 10/11/23 0254 10/11/23 0538 10/11/23 2258 10/12/23 0234 10/13/23 0406  NA 139 137 134* 138 138 140  K 3.8 3.8 3.7 3.7 3.8 4.2  CL 84* 85*  --  86* 89* 90*  CO2 >45* 43*  --  44* 42* 43*  GLUCOSE 95 87  --  134* 140* 101*  BUN 26* 40*  --  32* 30* 24*  CREATININE 0.57 1.04*  --  0.75 0.59 0.58  CALCIUM 8.9 8.5*  --  9.1 8.3* 8.8*  MG 2.5*  --   --  2.3 2.2 2.3  PHOS 3.6  --   --  2.4* 3.1 3.5    CBC: Recent Labs  Lab 10/09/23 0323 10/09/23 0813 10/10/23 0334 10/11/23 0254 10/11/23 0538 10/12/23 0234 10/13/23 0406  WBC 12.7*  --  12.4* 16.1*  --  10.1 7.4  NEUTROABS  --   --   --  11.7*  --   --   --   HGB 10.1*   < > 10.3* 10.6* 12.6 10.2* 10.1*  HCT 33.8*   < > 35.0* 38.0 37.0 34.8* 36.2  MCV 96.8  --  99.7 102.7*  --  102.4* 101.4*  PLT 172  --  166 235  --  161 140*   < > = values in this interval not displayed.  Scheduled Meds:  ALPRAZolam  0.25 mg Oral BID   arformoterol  15 mcg Nebulization BID   atorvastatin  80 mg Oral Daily   bethanechol  20 mg Oral Daily   budesonide (PULMICORT) nebulizer solution  0.25 mg Nebulization BID   Chlorhexidine Gluconate Cloth  6 each Topical Daily   docusate sodium  100 mg Oral BID   enoxaparin (LOVENOX) injection  40 mg Subcutaneous Q24H   escitalopram  5 mg Oral Daily   metoprolol tartrate  12.5 mg Oral BID   mouth rinse  15 mL Mouth Rinse 4 times per day   pantoprazole  20 mg Oral Daily   polyethylene glycol  17 g Oral Daily   revefenacin  175 mcg Nebulization Daily   Continuous Infusions:   Assessment & Plan:   Acute on chronic hypoxic/hypercapnic respiratory failure Severe COPD/emphysema Pulmonary fibrosis CAP Patient has responded well to BiPAP, inhaled bronchodilators and cefepime Continue O2 to maintain SpO2 greater than 88, PCCM is recommending lower dose oxygen given known CO2 retention She has been  weaned off BiPAP--she had refused BiPAP in the past,  Continue Yupelri, Brovana and Pulmicort Is presently not on oral steroids although as noted above Pulmicort is continued Outpatient sleep study to be ordered after discharge She has completed a 7-day course of cefepime for CAP  ICU delirium Appears to be resolved Start home Xanax If patient tolerates well, will restart Norco tomorrow  HFpEF HTN A-fib with RVR Patient's losartan was discontinued as she was hypotensive on admission She was started on metoprolol 12.5 twice daily for A-fib with RVR, now resolved Was not started on anticoagulation if it was thought to be in the setting of hypoxia/infection Can restart patient's Lasix if warranted, it had been held by [facility] to avoid contraction alkalosis   DVT prophylaxis: Lovenox Code Status: Full Family Communication: None today     Studies: No results found.  Principal Problem:   Acute hypoxic on chronic hypercapnic respiratory failure (HCC)     Kahle Mcqueen Orma Flaming, [facility]  If 7PM-7AM, please contact night-coverage [email or web address]   LOS: 7 days

## 2023-10-14 NOTE — Plan of Care (Signed)

## 2023-10-15 DIAGNOSIS — J9601 Acute respiratory failure with hypoxia: Secondary | ICD-10-CM | POA: Diagnosis not present

## 2023-10-15 DIAGNOSIS — J9612 Chronic respiratory failure with hypercapnia: Secondary | ICD-10-CM | POA: Diagnosis not present

## 2023-10-15 MED ORDER — METOPROLOL TARTRATE 25 MG PO TABS
25.0000 mg | ORAL_TABLET | Freq: Two times a day (BID) | ORAL | Status: DC
  Administered 2023-10-15 – 2023-10-17 (×4): 25 mg via ORAL
  Filled 2023-10-15 (×4): qty 1

## 2023-10-15 NOTE — Progress Notes (Signed)
Physical Therapy Treatment Patient Details Name: [name] MRN: [id number] DOB: [birth year] Today's Date: 10/15/2023   History of Present Illness 72 yo female admitted on 10/12 with acute on chronic hypercarbic and hypoxic resp failure, in setting of COPD and PNA. Hospital course complicated by ICU delirium.  PMHx: COPD, CHF, bronchiectasis, pulm nodules, nicotine addiction, heart murmur, pyelonephritis, e coli, bacteremia, HTN.    PT Comments  Pt reports feeling weak, states she needs to get out of bed. Pt demonstrating mobility progression this date, ambulating short room distance with RW with PT guarding pt for safety. Pt requires cues for form/safety throughout, but does not appear as impulsive as on initial PT eval. Pt tolerated repeated transfers throughout session, and SpO2 maintained 91% and greater during mobility on 4LO2 which pt states is her home baseline when OOB. Pt states her daughter will be able to assist her as needed at d/c, recommend HHPT.     If plan is discharge home, recommend the following: Help with stairs or ramp for entrance;Assist for transportation;Assistance with cooking/housework;A little help with walking and/or transfers;A little help with bathing/dressing/bathroom   Can travel by private vehicle        Equipment Recommendations  None recommended by PT    Recommendations for Other Services       Precautions / Restrictions Precautions Precautions: Fall Precaution Comments: watch sats, on 3.5-4LO2 chronically at home Restrictions Weight Bearing Restrictions: No     Mobility  Bed Mobility Overal bed mobility: Needs Assistance Bed Mobility: Supine to Sit     Supine to sit: Supervision, HOB elevated, Used rails          Transfers Overall transfer level: Needs assistance Equipment used: Rolling walker (2 wheels) Transfers: Sit to/from Stand Sit to Stand: Contact guard assist           General transfer comment: for safety, stand x4  throughout session from multiple surfaces (BSC, bed, recliner x2)    Ambulation/Gait Ambulation/Gait assistance: Contact guard assist Gait Distance (Feet): 24 Feet Assistive device: Rolling walker (2 wheels) Gait Pattern/deviations: Decreased stride length, Wide base of support, Trunk flexed, Step-through pattern Gait velocity: decr     General Gait Details: close guard for safety, cues for placement in RW and upright posture. SPO2 91% and greater on 4LO2   Stairs             Wheelchair Mobility     Tilt Bed    Modified Rankin (Stroke Patients Only)       Balance Overall balance assessment: Needs assistance   Sitting balance-Leahy Scale: Fair     Standing balance support: Bilateral upper extremity supported, Reliant on assistive device for balance Standing balance-Leahy Scale: Poor                              Cognition Arousal: Alert Behavior During Therapy: WFL for tasks assessed/performed Overall Cognitive Status: Impaired/Different from baseline Area of Impairment: Attention, Memory, Following commands, Safety/judgement                   Current Attention Level: Selective Memory: Decreased recall of precautions Following Commands: Follows one step commands with increased time Safety/Judgement: Decreased awareness of safety, Decreased awareness of deficits              Exercises      General Comments General comments (skin integrity, edema, etc.): requires pericare assist after BM, pt states her daughter can  assist with this at d/c      Pertinent Vitals/Pain Pain Assessment Pain Assessment: Faces Faces Pain Scale: Hurts little more Pain Location: buttocks Pain Descriptors / Indicators: Aching, Grimacing, Discomfort Pain Intervention(s): Limited activity within patient's tolerance, Monitored during session, Repositioned    Home Living                          Prior Function            PT Goals (current  goals can now be found in the care plan section) Acute Rehab PT Goals Patient Stated Goal: d/c home PT Goal Formulation: With patient Time For Goal Achievement: 10/27/23 Potential to Achieve Goals: Fair Progress towards PT goals: Progressing toward goals    Frequency    Min 1X/week      PT Plan      Co-evaluation              AM-PAC PT "6 Clicks" Mobility   Outcome Measure  Help needed turning from your back to your side while in a flat bed without using bedrails?: A Little Help needed moving from lying on your back to sitting on the side of a flat bed without using bedrails?: A Little Help needed moving to and from a bed to a chair (including a wheelchair)?: A Little Help needed standing up from a chair using your arms (e.g., wheelchair or bedside chair)?: A Little Help needed to walk in hospital room?: A Little Help needed climbing 3-5 steps with a railing? : A Lot 6 Click Score: 17    End of Session Equipment Utilized During Treatment: Oxygen Activity Tolerance: Patient tolerated treatment well Patient left: in chair;with call bell/phone within reach;with chair alarm set Nurse Communication: Mobility status PT Visit Diagnosis: Other abnormalities of gait and mobility (R26.89);Muscle weakness (generalized) (M62.81)     Time: 9811-9147 PT Time Calculation (min) (ACUTE ONLY): 25 min  Charges:    $Gait Training: 8-22 mins $Therapeutic Activity: 8-22 mins PT General Charges $$ ACUTE PT VISIT: 1 Visit                     Marye Round, PT DPT Acute Rehabilitation Services Secure Chat Preferred  Office [phone]    [name] 10/15/2023, 4:23 PM

## 2023-10-15 NOTE — Plan of Care (Signed)
  Problem: Education: Goal: Knowledge of General Education information will improve Description: Including pain rating scale, medication(s)/side effects and non-pharmacologic comfort measures Outcome: Progressing   Problem: Health Behavior/Discharge Planning: Goal: Ability to manage health-related needs will improve Outcome: Progressing   Problem: Clinical Measurements: Goal: Ability to maintain clinical measurements within normal limits will improve Outcome: Progressing Goal: Will remain free from infection Outcome: Progressing Goal: Diagnostic test results will improve Outcome: Progressing Goal: Respiratory complications will improve Outcome: Progressing Goal: Cardiovascular complication will be avoided Outcome: Progressing   Problem: Activity: Goal: Risk for activity intolerance will decrease Outcome: Progressing   Problem: Nutrition: Goal: Adequate nutrition will be maintained Outcome: Progressing   Problem: Coping: Goal: Level of anxiety will decrease Outcome: Progressing   Problem: Elimination: Goal: Will not experience complications related to bowel motility Outcome: Progressing Goal: Will not experience complications related to urinary retention Outcome: Progressing   Problem: Pain Managment: Goal: General experience of comfort will improve Outcome: Progressing   Problem: Safety: Goal: Ability to remain free from injury will improve Outcome: Progressing   Problem: Skin Integrity: Goal: Risk for impaired skin integrity will decrease Outcome: Progressing   Problem: Activity: Goal: Ability to tolerate increased activity will improve Outcome: Progressing   Problem: Clinical Measurements: Goal: Ability to maintain a body temperature in the normal range will improve Outcome: Progressing   Problem: Respiratory: Goal: Ability to maintain adequate ventilation will improve Outcome: Progressing Goal: Ability to maintain a clear airway will improve Outcome:  Progressing   Problem: Education: Goal: Knowledge of disease or condition will improve Outcome: Progressing Goal: Knowledge of the prescribed therapeutic regimen will improve Outcome: Progressing Goal: Individualized Educational Video(s) Outcome: Progressing   Problem: Respiratory: Goal: Ability to maintain a clear airway will improve Outcome: Progressing Goal: Levels of oxygenation will improve Outcome: Progressing Goal: Ability to maintain adequate ventilation will improve Outcome: Progressing

## 2023-10-15 NOTE — TOC Initial Note (Signed)
Transition of Care [facility]) - Initial/Assessment Note    Patient Details  Name: [name] MRN: [id number] Date of Birth: 11/25/1951  [facility] CM/SW Contact:    Delilah Shan, LCSWA Phone Number: 10/15/2023, 12:48 PM  Clinical Narrative:                  CSW received consult for possible SNF placement at time of discharge. CSW spoke with patient regarding PT recommendation of SNF placement at time of discharge. Patient reports PTA she comes from home with daughter.Patient expressed understanding of PT recommendation and politely declined SNF placement at time of discharge. Patient would like to return home with PT and is interested in speaking with cm to discuss home needs. Patient confirmed she has support of her daughter at home. CSW informed CM. No further questions reported at this time. CSW to continue to follow and assist with discharge planning needs.   Expected Discharge Plan: Home w Home Health Services Barriers to Discharge: Continued Medical Work up   Patient Goals and CMS Choice Patient states their goals for this hospitalization and ongoing recovery are:: to return home with PT   Choice offered to / list presented to : Patient      Expected Discharge Plan and Services In-house Referral: Clinical Social Work     Living arrangements for the past 2 months: Single Family Home                                      Prior Living Arrangements/Services Living arrangements for the past 2 months: Single Family Home Lives with::  (daughter) Patient language and need for interpreter reviewed:: Yes Do you feel safe going back to the place where you live?: Yes      Need for Family Participation in Patient Care: Yes (Comment) Care giver support system in place?: Yes (comment)   Criminal Activity/Legal Involvement Pertinent to Current Situation/Hospitalization: No - Comment as needed  Activities of Daily Living   ADL Screening (condition at time  of admission) Independently performs ADLs?: Yes (appropriate for developmental age) Is the patient deaf or have difficulty hearing?: No Does the patient have difficulty seeing, even when wearing glasses/contacts?: No Does the patient have difficulty concentrating, remembering, or making decisions?: Yes  Permission Sought/Granted Permission sought to share information with : Case Manager, Magazine features editor, Family Supports                Emotional Assessment Appearance:: Appears stated age Attitude/Demeanor/Rapport: Gracious Affect (typically observed): Calm Orientation: : Oriented to Self, Oriented to Place, Oriented to  Time, Oriented to Situation Alcohol / Substance Use: Not Applicable Psych Involvement: No (comment)  Admission diagnosis:  Acute respiratory failure with hypercapnia (HCC) [J96.02] Acute hypoxic on chronic hypercapnic respiratory failure (HCC) [J96.01, J96.12] Patient Active Problem List   Diagnosis Date Noted   Acute hypoxic on chronic hypercapnic respiratory failure (HCC) 10/07/2023   Chronic diastolic CHF (congestive heart failure) (HCC) 07/19/2023   Acute respiratory failure with hypoxia and hypercapnia (HCC) 07/19/2023   Leukocytosis 07/19/2023   History of UTI 07/19/2023   COPD GOLD 4  07/27/2022   Chronic lower back pain 07/06/2022   Pulmonary nodule 07/06/2022   Obesity (BMI 30-39.9) 07/06/2022   Normocytic anemia 07/06/2022   Elevated troponin    Hypercholesterolemia 03/24/2022   COPD with acute exacerbation (HCC) 06/15/2021   Essential hypertension    Anxiety  Therapeutic opioid-induced constipation (OIC) 05/27/2021   Urinary retention 07/01/2020   Hematochezia 11/09/2018   Presence of left artificial elbow joint 06/29/2017   Urinary urgency 01/25/2016   Blood in urine 10/09/2015   Atrophic vaginitis 10/09/2015   GERD (gastroesophageal reflux disease) 09/04/2014   Macrocytic anemia 09/04/2014   Constipation 07/14/2014    Nicotine addiction 04/07/2014   PCP:  Benita Stabile, MD Pharmacy:   [facility] - [place], [place] - [address] [place] [address] Phone: [phone] Fax: [phone]  [facility] - [facility] [address] [place] [address] Phone: [phone] Fax: [phone]     Social Determinants of Health (SDOH) Social History: SDOH Screenings   Food Insecurity: No Food Insecurity (10/07/2023)  Housing: Low Risk  (10/07/2023)  Transportation Needs: No Transportation Needs (10/07/2023)  Utilities: Not At Risk (10/07/2023)  Tobacco Use: Medium Risk (10/07/2023)   SDOH Interventions:     Readmission Risk Interventions    07/12/2023   12:24 PM  Readmission Risk Prevention Plan  Transportation Screening Complete  PCP or Specialist Appt within 5-7 Days Complete  Home Care Screening Complete  Medication Review (RN CM) Complete

## 2023-10-15 NOTE — Progress Notes (Signed)
PROGRESS NOTE    [name]  ZOX:[id number]  DOB: [birth year]  DOA: 10/07/2023 PCP: Benita Stabile, MD Outpatient Specialists:   Hospital course:  72 year old female with recurrent hypercapnic/hypoxic respiratory failure secondary to severe COPD/emphysema, pulmonary fibrosis and calcification was admitted 10/12 with acute on chronic hypoxic respiratory failure which she has failed outpatient therapy.  Patient was found to have CAP.  Patient was admitted by PCCM to ICU and treated with BiPAP, bronchodilators, oxygen and good pulmonary toilet with improvement of symptoms.  Her course was complicated by agitation and ICU delirium which was treated with as needed Haldol and Ativan.  Patient was transferred out of the ICU today, 10/14/2023   Subjective:  Patient feels much better on resumption of Xanax.  However now feels like she wants to go home.  Reviewed that PT has recommended inpatient SNF.  Patient states she has been to rehab before and does not want to go back.  States that she feels much better now than when PT worked with her, she think she can go home with home health..  She states that she lives with her daughter and thinks they can help her.   Objective: Vitals:   10/15/23 0507 10/15/23 0835 10/15/23 1217 10/15/23 1620  BP: (!) 152/65 (!) 169/75  (!) 158/70  Pulse: 86 88  86  Resp: 17 16  16   Temp: 98.4 F (36.9 C) 98.2 F (36.8 C)  98.7 F (37.1 C)  TempSrc: Oral Oral  Oral  SpO2: 95% 98% 97% 98%  Weight:      Height:        Intake/Output Summary (Last 24 hours) at 10/15/2023 1710 Last data filed at 10/15/2023 8119 Gross per 24 hour  Intake 120 ml  Output 2100 ml  Net -1980 ml   Filed Weights   10/12/23 0500 10/13/23 0500 10/14/23 0500  Weight: 83.7 kg 88.3 kg 88 kg     Exam:  General: Patient is awake, alert, in much better spirits than yesterday. Eyes: sclera anicteric, conjuctiva mild injection bilaterally CVS: S1-S2, regular  Respiratory:   decreased air entry bilaterally secondary to decreased inspiratory effort, rales at bases  GI: NABS, soft, NT  LE: Warm and well-perfused Neuro: A/O x 3,  grossly nonfocal.  Psych: Is here and cooperative.  Data Reviewed:  Basic Metabolic Panel: Recent Labs  Lab 10/10/23 0334 10/11/23 0254 10/11/23 0538 10/11/23 2258 10/12/23 0234 10/13/23 0406  NA 139 137 134* 138 138 140  K 3.8 3.8 3.7 3.7 3.8 4.2  CL 84* 85*  --  86* 89* 90*  CO2 >45* 43*  --  44* 42* 43*  GLUCOSE 95 87  --  134* 140* 101*  BUN 26* 40*  --  32* 30* 24*  CREATININE 0.57 1.04*  --  0.75 0.59 0.58  CALCIUM 8.9 8.5*  --  9.1 8.3* 8.8*  MG 2.5*  --   --  2.3 2.2 2.3  PHOS 3.6  --   --  2.4* 3.1 3.5    CBC: Recent Labs  Lab 10/09/23 0323 10/09/23 0813 10/10/23 0334 10/11/23 0254 10/11/23 0538 10/12/23 0234 10/13/23 0406  WBC 12.7*  --  12.4* 16.1*  --  10.1 7.4  NEUTROABS  --   --   --  11.7*  --   --   --   HGB 10.1*   < > 10.3* 10.6* 12.6 10.2* 10.1*  HCT 33.8*   < > 35.0* 38.0 37.0 34.8* 36.2  MCV  96.8  --  99.7 102.7*  --  102.4* 101.4*  PLT 172  --  166 235  --  161 140*   < > = values in this interval not displayed.     Scheduled Meds:  ALPRAZolam  0.25 mg Oral BID   arformoterol  15 mcg Nebulization BID   atorvastatin  80 mg Oral Daily   bethanechol  20 mg Oral Daily   budesonide (PULMICORT) nebulizer solution  0.25 mg Nebulization BID   Chlorhexidine Gluconate Cloth  6 each Topical Daily   docusate sodium  100 mg Oral BID   enoxaparin (LOVENOX) injection  40 mg Subcutaneous Q24H   escitalopram  5 mg Oral Daily   metoprolol tartrate  12.5 mg Oral BID   mouth rinse  15 mL Mouth Rinse 4 times per day   pantoprazole  20 mg Oral Daily   polyethylene glycol  17 g Oral Daily   revefenacin  175 mcg Nebulization Daily   tamsulosin  0.4 mg Oral QPC supper   Continuous Infusions:   Assessment & Plan:   Disposition PT are recommending discharge to SNF rehab Patient however wants to go  home, states she lives with her daughter However does admit she has history of falls in the past after discharge from hospital Patient feels that she is doing much better than when she was evaluated by PT previously Attempted to call patient's daughter Jaquasha Gotts to ensure that she would be there to support her mother--no answer Plan is for PT to reevaluate patient tomorrow and see if any change in recommendations can be made, if not patient will go home   Acute on chronic hypoxic/hypercapnic respiratory failure Severe COPD/emphysema Pulmonary fibrosis CAP Patient has responded well to BiPAP, inhaled bronchodilators and cefepime Continue O2 to maintain SpO2 greater than 88, PCCM is recommending lower dose oxygen given known CO2 retention She has been weaned off BiPAP--she had refused BiPAP in the past,  Continue Yupelri, Brovana and Pulmicort Is presently not on oral steroids although as noted above Pulmicort is continued Outpatient sleep study to be ordered after discharge She has completed a 7-day course of cefepime for CAP  ICU delirium--resolved  Anxiety and depression Mood much better on home doses of Xanax initiated yesterday  HFpEF HTN A-fib with RVR Patient's losartan was discontinued as she was hypotensive on admission She was started on metoprolol 12.5 twice daily for A-fib with RVR, Afib is now resolved Blood pressure still elevated, will increase metoprolol to 25 twice daily No anticoagulation for A-fib as it was thought to be in the setting of hypoxia/infection Can restart patient's Lasix if warranted, it had been held by [facility] to avoid contraction alkalosis   DVT prophylaxis: Lovenox Code Status: Full Family Communication: 2 attempts to call patient's daughter Kalana Stegenga were unsuccessful     Studies: No results found.  Principal Problem:   Acute hypoxic on chronic hypercapnic respiratory failure (HCC)     Tyianna Menefee Orma Flaming, [facility]  If 7PM-7AM, please contact night-coverage [email or web address]   LOS: 8 days

## 2023-10-16 ENCOUNTER — Encounter (HOSPITAL_COMMUNITY): Payer: Self-pay | Admitting: Pulmonary Disease

## 2023-10-16 ENCOUNTER — Other Ambulatory Visit (HOSPITAL_COMMUNITY): Payer: Self-pay

## 2023-10-16 DIAGNOSIS — I480 Paroxysmal atrial fibrillation: Secondary | ICD-10-CM | POA: Diagnosis not present

## 2023-10-16 DIAGNOSIS — T402X5A Adverse effect of other opioids, initial encounter: Secondary | ICD-10-CM

## 2023-10-16 DIAGNOSIS — I5033 Acute on chronic diastolic (congestive) heart failure: Secondary | ICD-10-CM | POA: Diagnosis not present

## 2023-10-16 DIAGNOSIS — A419 Sepsis, unspecified organism: Secondary | ICD-10-CM | POA: Insufficient documentation

## 2023-10-16 DIAGNOSIS — J9601 Acute respiratory failure with hypoxia: Secondary | ICD-10-CM | POA: Diagnosis not present

## 2023-10-16 DIAGNOSIS — I5032 Chronic diastolic (congestive) heart failure: Secondary | ICD-10-CM

## 2023-10-16 DIAGNOSIS — J9611 Chronic respiratory failure with hypoxia: Secondary | ICD-10-CM

## 2023-10-16 DIAGNOSIS — K5903 Drug induced constipation: Secondary | ICD-10-CM

## 2023-10-16 DIAGNOSIS — J189 Pneumonia, unspecified organism: Secondary | ICD-10-CM

## 2023-10-16 DIAGNOSIS — G9341 Metabolic encephalopathy: Secondary | ICD-10-CM

## 2023-10-16 DIAGNOSIS — I10 Essential (primary) hypertension: Secondary | ICD-10-CM

## 2023-10-16 MED ORDER — LOPERAMIDE HCL 2 MG PO CAPS
4.0000 mg | ORAL_CAPSULE | Freq: Once | ORAL | Status: AC
  Administered 2023-10-16: 4 mg via ORAL
  Filled 2023-10-16: qty 2

## 2023-10-16 MED ORDER — METOPROLOL TARTRATE 25 MG PO TABS
25.0000 mg | ORAL_TABLET | Freq: Two times a day (BID) | ORAL | 0 refills | Status: DC
  Filled 2023-10-16: qty 180, 90d supply, fill #0

## 2023-10-16 MED ORDER — IPRATROPIUM-ALBUTEROL 0.5-2.5 (3) MG/3ML IN SOLN
3.0000 mL | Freq: Four times a day (QID) | RESPIRATORY_TRACT | 0 refills | Status: AC
  Filled 2023-10-16: qty 360, 30d supply, fill #0

## 2023-10-16 MED ORDER — BUDESONIDE 0.25 MG/2ML IN SUSP
0.2500 mg | Freq: Two times a day (BID) | RESPIRATORY_TRACT | 0 refills | Status: AC
  Filled 2023-10-16: qty 120, 30d supply, fill #0

## 2023-10-16 NOTE — Assessment & Plan Note (Signed)
Thought to be due to infection/respiratory distress. Back in NSR. Decision made not to pursue systemic anticoagulation.

## 2023-10-16 NOTE — Subjective & Objective (Addendum)
Pt seen and examined. RN verified that pt's dtr will be here at hospital tomorrow to pick up patient. Pt had some diarrhea today from miralax and colace and refuses to be discharged today.  Pt refuses SNF and wants to go home and live with her dtr.

## 2023-10-16 NOTE — Assessment & Plan Note (Signed)
Pt treated with colace and miralax. Now with diarrhea 10-16-2023. Laxatives held. Given 1 dose of immodium

## 2023-10-16 NOTE — Assessment & Plan Note (Signed)
Advised pt to stop smoking. 

## 2023-10-16 NOTE — Care Management Important Message (Signed)
Important Message  Patient Details  Name: [name] MRN: [id number] Date of Birth: June 19, 1951   Important Message Given:  Yes - Medicare IM     Dorena Bodo 10/16/2023, 3:06 PM

## 2023-10-16 NOTE — Hospital Course (Signed)
HPI: Activated EMS for not feeling well Has been started on antibiotics and steroids by Dr. Octavio Graves on 10/10 Activated EMS with increased work of breathing, saturations noted to be 50% on her usual 2 L of oxygen was given Breathing treatments and brought to the hospital Started on BiPAP Worsening ABG  Admitted to [facility] service on 10-07-2023.  Significant Events: Admitted 10/07/2023 to PCCM for acute on chronic respiratory failure 10/12 acute on chronic hypoxic respiratory failure with failed outpatient therapies including steroids  CT scan chest 07/23/2023-pleural calcification, fibrosis, emphysema PFT 06/02/2016 severe obstructive lung disease ABG 10/07/2023 7.21/123/167 10/13 persistent hypercapnia despite ongoing BiPAP use 10/14 was apparently agitated overnight and received prn Ativan and on precedex, sedated on bipap  10/16 early AM, BiPAP changed to AVAPS to target MV 12-15 10-14-2023 transferred to [facility] service  Significant Labs:   Significant Imaging Studies:   Antibiotic Therapy: Anti-infectives (From admission, onward)    Start     Dose/Rate Route Frequency Ordered Stop   10/08/23 1600  vancomycin (VANCOCIN) IVPB 1000 mg/200 mL premix  Status:  Discontinued        1,000 mg 200 mL/hr over 60 Minutes Intravenous Every 24 hours 10/07/23 1900 10/08/23 1002   10/07/23 2100  ceFEPIme (MAXIPIME) 2 g in sodium chloride 0.9 % 100 mL IVPB        2 g 200 mL/hr over 30 Minutes Intravenous Every 8 hours 10/07/23 1708 10/11/23 2231   10/07/23 1300  vancomycin (VANCOREADY) IVPB 1750 mg/350 mL        1,750 mg 175 mL/hr over 120 Minutes Intravenous  Once 10/07/23 1245 10/07/23 1834   10/07/23 1245  ceFEPIme (MAXIPIME) 2 g in sodium chloride 0.9 % 100 mL IVPB        2 g 200 mL/hr over 30 Minutes Intravenous  Once 10/07/23 1231 10/07/23 1356       Procedures:   Consultants: PCCM

## 2023-10-16 NOTE — Assessment & Plan Note (Signed)
10-07-2023. Admitted to PCCM on Bipap. Improved with ventilation of elevated PCO2. Transferred to [facility] on 10-14-2023 10-16-2023 pt normally on 3.5 L/min at rest. 10-17-2023 stable on 3.5 L/min at rest

## 2023-10-16 NOTE — Assessment & Plan Note (Signed)
Chronic. On continuous O2 @ 3.5 L/min at rest, 4.5 L/min with activity. Chronically elevated PCO2 @ 80-85 mm Hg

## 2023-10-16 NOTE — Plan of Care (Signed)
  Problem: Education: Goal: Knowledge of General Education information will improve Description: Including pain rating scale, medication(s)/side effects and non-pharmacologic comfort measures Outcome: Progressing   Problem: Health Behavior/Discharge Planning: Goal: Ability to manage health-related needs will improve Outcome: Progressing   Problem: Clinical Measurements: Goal: Ability to maintain clinical measurements within normal limits will improve Outcome: Progressing Goal: Will remain free from infection Outcome: Progressing Goal: Diagnostic test results will improve Outcome: Progressing Goal: Respiratory complications will improve Outcome: Progressing Goal: Cardiovascular complication will be avoided Outcome: Progressing   Problem: Activity: Goal: Risk for activity intolerance will decrease Outcome: Progressing   Problem: Nutrition: Goal: Adequate nutrition will be maintained Outcome: Progressing   Problem: Coping: Goal: Level of anxiety will decrease Outcome: Progressing   Problem: Elimination: Goal: Will not experience complications related to bowel motility Outcome: Progressing Goal: Will not experience complications related to urinary retention Outcome: Progressing   Problem: Pain Managment: Goal: General experience of comfort will improve Outcome: Progressing   Problem: Safety: Goal: Ability to remain free from injury will improve Outcome: Progressing   Problem: Skin Integrity: Goal: Risk for impaired skin integrity will decrease Outcome: Progressing   Problem: Activity: Goal: Ability to tolerate increased activity will improve Outcome: Progressing   Problem: Clinical Measurements: Goal: Ability to maintain a body temperature in the normal range will improve Outcome: Progressing   Problem: Respiratory: Goal: Ability to maintain adequate ventilation will improve Outcome: Progressing Goal: Ability to maintain a clear airway will improve Outcome:  Progressing   Problem: Education: Goal: Knowledge of disease or condition will improve Outcome: Progressing Goal: Knowledge of the prescribed therapeutic regimen will improve Outcome: Progressing Goal: Individualized Educational Video(s) Outcome: Progressing   Problem: Activity: Goal: Ability to tolerate increased activity will improve Outcome: Progressing Goal: Will verbalize the importance of balancing activity with adequate rest periods Outcome: Progressing   Problem: Respiratory: Goal: Ability to maintain a clear airway will improve Outcome: Progressing Goal: Levels of oxygenation will improve Outcome: Progressing Goal: Ability to maintain adequate ventilation will improve Outcome: Progressing   Problem: Safety: Goal: Non-violent Restraint(s) Outcome: Progressing

## 2023-10-16 NOTE — Progress Notes (Signed)
   10/16/23 2122  BiPAP/CPAP/SIPAP  Reason BIPAP/CPAP not in use Non-compliant   Pt states she has not being using BiPAP since transferred from ICU, maintaining SpO2 on nasal cannula at this time.

## 2023-10-16 NOTE — Assessment & Plan Note (Signed)
Treated with IV steroids and nebs. Resolved acute exacerbation.  10-17-2023 will discharge on duonebs qid and pulmicort nebs bid. Home health nebulizer machine ordered for patient. Pt refused SNF placement and therefore pt was discharge to home.

## 2023-10-16 NOTE — Assessment & Plan Note (Signed)
Due to elevated PCO2. Resolved now.

## 2023-10-16 NOTE — Assessment & Plan Note (Addendum)
Stable. On lopressor 25 mg bid. 10-17-2023 pt with chronic bicarb elevation due to chronic CO2 retention. In effort not to cause increase in serum bicarb from contraction alkalosis from diuretics, pt's HTN meds changed to lopressor. Also give pt's recent loose stools from laxatives, have stopped ARB to prevent AKI.

## 2023-10-16 NOTE — Assessment & Plan Note (Signed)
Treated with IV lasix for CHF exacerbation. Currently not on diuretics due to concern about contraction alkalosis and causing increase in her already elevated serum bicarb(from chronic CO2 retention).

## 2023-10-16 NOTE — Assessment & Plan Note (Signed)
On chronic xanax.

## 2023-10-16 NOTE — Assessment & Plan Note (Addendum)
Present on admission. Due to COPD Exacerbation, acute on chronic respiratory failure, CAP. Now resolved.

## 2023-10-16 NOTE — Assessment & Plan Note (Signed)
Stable. On protonix.  

## 2023-10-16 NOTE — Assessment & Plan Note (Signed)
Pt treated by PCCM team with 5 days of IV cefepime. Treatment completed.

## 2023-10-16 NOTE — Progress Notes (Addendum)
PROGRESS NOTE    [name]  MVH:[id number] DOB: [birth year] DOA: 10/07/2023 PCP: Benita Stabile, MD  Subjective: Pt seen and examined. RN verified that pt's dtr will be here at hospital tomorrow to pick up patient. Pt had some diarrhea today from miralax and colace and refuses to be discharged today.  Pt refuses SNF and wants to go home and live with her dtr.    Hospital Course: HPI: Activated EMS for not feeling well Has been started on antibiotics and steroids by Dr. Octavio Graves on 10/10 Activated EMS with increased work of breathing, saturations noted to be 50% on her usual 2 L of oxygen was given Breathing treatments and brought to the hospital Started on BiPAP Worsening ABG  Admitted to [facility] service on 10-07-2023.  Significant Events: Admitted 10/07/2023 to PCCM for acute on chronic respiratory failure 10/12 acute on chronic hypoxic respiratory failure with failed outpatient therapies including steroids  CT scan chest 07/23/2023-pleural calcification, fibrosis, emphysema PFT 06/02/2016 severe obstructive lung disease ABG 10/07/2023 7.21/123/167 10/13 persistent hypercapnia despite ongoing BiPAP use 10/14 was apparently agitated overnight and received prn Ativan and on precedex, sedated on bipap  10/16 early AM, BiPAP changed to AVAPS to target MV 12-15 10-14-2023 transferred to [facility] service  Significant Labs:   Significant Imaging Studies:   Antibiotic Therapy: Anti-infectives (From admission, onward)    Start     Dose/Rate Route Frequency Ordered Stop   10/08/23 1600  vancomycin (VANCOCIN) IVPB 1000 mg/200 mL premix  Status:  Discontinued        1,000 mg 200 mL/hr over 60 Minutes Intravenous Every 24 hours 10/07/23 1900 10/08/23 1002   10/07/23 2100  ceFEPIme (MAXIPIME) 2 g in sodium chloride 0.9 % 100 mL IVPB        2 g 200 mL/hr over 30 Minutes Intravenous Every 8 hours 10/07/23 1708 10/11/23 2231   10/07/23 1300  vancomycin (VANCOREADY) IVPB 1750 mg/350 mL         1,750 mg 175 mL/hr over 120 Minutes Intravenous  Once 10/07/23 1245 10/07/23 1834   10/07/23 1245  ceFEPIme (MAXIPIME) 2 g in sodium chloride 0.9 % 100 mL IVPB        2 g 200 mL/hr over 30 Minutes Intravenous  Once 10/07/23 1231 10/07/23 1356       Procedures:   Consultants: PCCM    Assessment and Plan: * Acute hypoxic on chronic hypercapnic respiratory failure (HCC) - 3.5 L/min at rest, 4.5 L/min with activity 10-07-2023. Admitted to PCCM on Bipap. Improved with ventilation of elevated PCO2. Transferred to [facility] on 10-14-2023 10-16-2023 pt normally on 3.5 L/min at rest.  PAF (paroxysmal atrial fibrillation) (HCC) Thought to be due to infection/respiratory distress. Back in NSR. Decision made not to pursue systemic anticoagulation.  CAP (community acquired pneumonia) Pt treated by PCCM team with 5 days of IV cefepime. Treatment completed.  Chronic diastolic CHF (congestive heart failure) (HCC) Treated with IV lasix for CHF exacerbation. Currently not on diuretics due to concern about contraction alkalosis and causing increase in her already elevated serum bicarb(from chronic CO2 retention).  Acute metabolic encephalopathy Due to elevated PCO2. Resolved now.  Acute on chronic diastolic CHF (congestive heart failure) (HCC) Treated with IV lasix. Not on scheduled lasix due to concern about contraction alkalosis and baseline elevated serum bicarb from chronic CO2 retention.  Anxiety On chronic xanax.  Essential hypertension Stable. On lopressor 25 mg bid.  COPD with acute exacerbation (HCC) Treated with IV steroids and nebs. Resolved  acute exacerbation.  Therapeutic opioid-induced constipation (OIC) Pt treated with colace and miralax. Now with diarrhea 10-16-2023. Laxatives held. Given 1 dose of immodium  GERD (gastroesophageal reflux disease) Stable. On protonix.  Nicotine addiction Advised pt to stop smoking.  Sepsis (HCC) Present on admission. Due to COPD  Exacerbation, acute on chronic respiratory failure, CAP. Now resolved.  Chronic respiratory failure with hypoxia and hypercapnia (HCC) - on 3.5 L/min @ rest, 4.5 L/min with activity. baseline PCO2 80-85 mmHg Chronic. On continuous O2 @ 3.5 L/min at rest, 4.5 L/min with activity. Chronically elevated PCO2 @ 80-85 mm Hg   DVT prophylaxis: enoxaparin (LOVENOX) injection 40 mg Start: 10/13/23 1000 SCDs Start: 10/07/23 1647    Code Status: Full Code Family Communication: no family at bedside. Pt is decisional Disposition Plan: return home Reason for continuing need for hospitalization: medically stable for DC today.  Objective: Vitals:   10/16/23 0510 10/16/23 0518 10/16/23 0758 10/16/23 0803  BP:  (!) 132/58 (!) 143/76   Pulse:  78 84   Resp:  18 16   Temp:  98.6 F (37 C) 98.3 F (36.8 C)   TempSrc:  Oral Oral   SpO2:  97% 99% 96%  Weight: 79.2 kg     Height:        Intake/Output Summary (Last 24 hours) at 10/16/2023 1428 Last data filed at 10/16/2023 0518 Gross per 24 hour  Intake --  Output 1700 ml  Net -1700 ml   Filed Weights   10/13/23 0500 10/14/23 0500 10/16/23 0510  Weight: 88.3 kg 88 kg 79.2 kg    Examination:  Physical Exam Vitals and nursing note reviewed.  Constitutional:      General: She is not in acute distress.    Appearance: She is not toxic-appearing or diaphoretic.     Comments: Chronically ill appearing  HENT:     Head: Normocephalic and atraumatic.     Nose: Nose normal.  Eyes:     General: No scleral icterus. Cardiovascular:     Rate and Rhythm: Normal rate and regular rhythm.  Pulmonary:     Effort: Pulmonary effort is normal. No respiratory distress.     Breath sounds: No wheezing.  Abdominal:     General: Bowel sounds are normal. There is no distension.     Palpations: Abdomen is soft.  Musculoskeletal:     Right lower leg: No edema.     Left lower leg: No edema.  Skin:    General: Skin is warm and dry.     Capillary Refill:  Capillary refill takes less than 2 seconds.  Neurological:     General: No focal deficit present.     Mental Status: She is alert and oriented to person, place, and time.     Data Reviewed: I have personally reviewed following labs and imaging studies  CBC: Recent Labs  Lab 10/10/23 0334 10/11/23 0254 10/11/23 0538 10/12/23 0234 10/13/23 0406  WBC 12.4* 16.1*  --  10.1 7.4  NEUTROABS  --  11.7*  --   --   --   HGB 10.3* 10.6* 12.6 10.2* 10.1*  HCT 35.0* 38.0 37.0 34.8* 36.2  MCV 99.7 102.7*  --  102.4* 101.4*  PLT 166 235  --  161 140*   Basic Metabolic Panel: Recent Labs  Lab 10/10/23 0334 10/11/23 0254 10/11/23 0538 10/11/23 2258 10/12/23 0234 10/13/23 0406  NA 139 137 134* 138 138 140  K 3.8 3.8 3.7 3.7 3.8 4.2  CL 84* 85*  --  86* 89* 90*  CO2 >45* 43*  --  44* 42* 43*  GLUCOSE 95 87  --  134* 140* 101*  BUN 26* 40*  --  32* 30* 24*  CREATININE 0.57 1.04*  --  0.75 0.59 0.58  CALCIUM 8.9 8.5*  --  9.1 8.3* 8.8*  MG 2.5*  --   --  2.3 2.2 2.3  PHOS 3.6  --   --  2.4* 3.1 3.5   GFR: Estimated Creatinine Clearance: 65.2 mL/min (by C-G formula based on SCr of 0.58 mg/dL).    BNP (last 3 results) Recent Labs    07/19/23 0358 10/07/23 1148  BNP 425.4* 895.2*   CBG: Recent Labs  Lab 10/10/23 0334 10/10/23 1958 10/10/23 2312 10/11/23 0331 10/11/23 2240  GLUCAP 89 91 72 83 123*    Recent Results (from the past 240 hour(s))  Resp panel by RT-PCR (RSV, Flu A&B, Covid) Anterior Nasal Swab     Status: None   Collection Time: 10/07/23 11:39 AM   Specimen: Anterior Nasal Swab  Result Value Ref Range Status   SARS Coronavirus 2 by RT PCR NEGATIVE NEGATIVE Final   Influenza A by PCR NEGATIVE NEGATIVE Final   Influenza B by PCR NEGATIVE NEGATIVE Final    Comment: (NOTE) The Xpert Xpress SARS-CoV-2/FLU/RSV plus assay is intended as an aid in the diagnosis of influenza from Nasopharyngeal swab specimens and should not be used as a sole basis for  treatment. Nasal washings and aspirates are unacceptable for Xpert Xpress SARS-CoV-2/FLU/RSV testing.  Fact Sheet for Patients: BloggerCourse.com  Fact Sheet for Healthcare Providers: SeriousBroker.it  This test is not yet approved or cleared by the [place] FDA and has been authorized for detection and/or diagnosis of SARS-CoV-2 by FDA under an Emergency Use Authorization (EUA). This EUA will remain in effect (meaning this test can be used) for the duration of the COVID-19 declaration under Section 564(b)(1) of the Act, 21 U.S.C. section 360bbb-3(b)(1), unless the authorization is terminated or revoked.     Resp Syncytial Virus by PCR NEGATIVE NEGATIVE Final    Comment: (NOTE) Fact Sheet for Patients: BloggerCourse.com  Fact Sheet for Healthcare Providers: SeriousBroker.it  This test is not yet approved or cleared by the [place] FDA and has been authorized for detection and/or diagnosis of SARS-CoV-2 by FDA under an Emergency Use Authorization (EUA). This EUA will remain in effect (meaning this test can be used) for the duration of the COVID-19 declaration under Section 564(b)(1) of the Act, 21 U.S.C. section 360bbb-3(b)(1), unless the authorization is terminated or revoked.  Performed at [facility], [address], [place], [place] [address]   MRSA Next Gen by PCR, Nasal     Status: None   Collection Time: 10/07/23  6:30 PM   Specimen: Nasal Mucosa; Nasal Swab  Result Value Ref Range Status   MRSA by PCR Next Gen NOT DETECTED NOT DETECTED Final    Comment: (NOTE) The GeneXpert MRSA Assay (FDA approved for NASAL specimens only), is one component of a comprehensive MRSA colonization surveillance program. It is not intended to diagnose MRSA infection nor to guide or monitor treatment for MRSA infections. Test performance is not FDA approved in patients  less than 30 years old. Performed at [facility], [address] [place], [place] [address]      Radiology Studies: No results found.  Scheduled Meds:  ALPRAZolam  0.25 mg Oral BID   arformoterol  15 mcg Nebulization BID  atorvastatin  80 mg Oral Daily   bethanechol  20 mg Oral Daily   budesonide (PULMICORT) nebulizer solution  0.25 mg Nebulization BID   Chlorhexidine Gluconate Cloth  6 each Topical Daily   enoxaparin (LOVENOX) injection  40 mg Subcutaneous Q24H   escitalopram  5 mg Oral Daily   metoprolol tartrate  25 mg Oral BID   mouth rinse  15 mL Mouth Rinse 4 times per day   pantoprazole  20 mg Oral Daily   revefenacin  175 mcg Nebulization Daily   tamsulosin  0.4 mg Oral QPC supper   Continuous Infusions:   LOS: 9 days   Time spent: 40 minutes  Carollee Herter, DO  [facility]  10/16/2023, 2:28 PM

## 2023-10-16 NOTE — Assessment & Plan Note (Signed)
Treated with IV lasix. Not on scheduled lasix due to concern about contraction alkalosis and baseline elevated serum bicarb from chronic CO2 retention.

## 2023-10-17 DIAGNOSIS — G9341 Metabolic encephalopathy: Secondary | ICD-10-CM | POA: Diagnosis not present

## 2023-10-17 DIAGNOSIS — I5033 Acute on chronic diastolic (congestive) heart failure: Secondary | ICD-10-CM | POA: Diagnosis not present

## 2023-10-17 DIAGNOSIS — J9601 Acute respiratory failure with hypoxia: Secondary | ICD-10-CM | POA: Diagnosis not present

## 2023-10-17 DIAGNOSIS — J189 Pneumonia, unspecified organism: Secondary | ICD-10-CM | POA: Diagnosis not present

## 2023-10-17 LAB — CBC WITH DIFFERENTIAL/PLATELET
Abs Immature Granulocytes: 0.05 10*3/uL (ref 0.00–0.07)
Basophils Absolute: 0 10*3/uL (ref 0.0–0.1)
Basophils Relative: 0 %
Eosinophils Absolute: 0.3 10*3/uL (ref 0.0–0.5)
Eosinophils Relative: 3 %
HCT: 36.1 % (ref 36.0–46.0)
Hemoglobin: 10.5 g/dL — ABNORMAL LOW (ref 12.0–15.0)
Immature Granulocytes: 1 %
Lymphocytes Relative: 12 %
Lymphs Abs: 1.3 10*3/uL (ref 0.7–4.0)
MCH: 28.6 pg (ref 26.0–34.0)
MCHC: 29.1 g/dL — ABNORMAL LOW (ref 30.0–36.0)
MCV: 98.4 fL (ref 80.0–100.0)
Monocytes Absolute: 1.4 10*3/uL — ABNORMAL HIGH (ref 0.1–1.0)
Monocytes Relative: 14 %
Neutro Abs: 7.4 10*3/uL (ref 1.7–7.7)
Neutrophils Relative %: 70 %
Platelets: 165 10*3/uL (ref 150–400)
RBC: 3.67 MIL/uL — ABNORMAL LOW (ref 3.87–5.11)
RDW: 13.1 % (ref 11.5–15.5)
WBC: 10.5 10*3/uL (ref 4.0–10.5)
nRBC: 0 % (ref 0.0–0.2)

## 2023-10-17 LAB — COMPREHENSIVE METABOLIC PANEL
ALT: 23 U/L (ref 0–44)
AST: 20 U/L (ref 15–41)
Albumin: 3 g/dL — ABNORMAL LOW (ref 3.5–5.0)
Alkaline Phosphatase: 49 U/L (ref 38–126)
Anion gap: 14 (ref 5–15)
BUN: 11 mg/dL (ref 8–23)
CO2: 37 mmol/L — ABNORMAL HIGH (ref 22–32)
Calcium: 8.8 mg/dL — ABNORMAL LOW (ref 8.9–10.3)
Chloride: 92 mmol/L — ABNORMAL LOW (ref 98–111)
Creatinine, Ser: 0.49 mg/dL (ref 0.44–1.00)
GFR, Estimated: >60 mL/min (ref >60)
Glucose, Bld: 141 mg/dL — ABNORMAL HIGH (ref 70–99)
Potassium: 4.3 mmol/L (ref 3.5–5.1)
Sodium: 143 mmol/L (ref 135–145)
Total Bilirubin: 0.6 mg/dL (ref 0.3–1.2)
Total Protein: 5.2 g/dL — ABNORMAL LOW (ref 6.5–8.1)

## 2023-10-17 LAB — BLOOD GAS, VENOUS
Acid-Base Excess: 19 mmol/L — ABNORMAL HIGH (ref 0.0–2.0)
Bicarbonate: 48.3 mmol/L — ABNORMAL HIGH (ref 20.0–28.0)
O2 Saturation: 97.1 %
Patient temperature: 37.1
pCO2, Ven: 78 mm[Hg] (ref 44–60)
pH, Ven: 7.4 (ref 7.25–7.43)
pO2, Ven: 70 mm[Hg] — ABNORMAL HIGH (ref 32–45)

## 2023-10-17 NOTE — Plan of Care (Signed)
  Problem: Education: Goal: Knowledge of General Education information will improve Description: Including pain rating scale, medication(s)/side effects and non-pharmacologic comfort measures 10/17/2023 1019 by Doyce Para, RN Outcome: Adequate for Discharge 10/17/2023 1019 by Doyce Para, RN Outcome: Progressing

## 2023-10-17 NOTE — Progress Notes (Signed)
Transition of Care [facility]) - Inpatient Brief Assessment   Patient Details  Name: [name] MRN: [id number] Date of Birth: 17-May-1951  Transition of Care [facility]) CM/SW Contact:    Janae Bridgeman, RN Phone Number: 10/17/2023, 10:16 AM   Clinical Narrative: Cm met with the patient at the bedside to discuss TOC needs.  The patient plans to discharge home with the daughter today by car.  The patient has history of home health through [facility] but is no longer active with them.  I called [facility] at the request of patient to resume services.  I spoke with Haywood Lasso, CM with [facility] and she accepted for the patient.  HH orders are in place.  DME at the home includes home oxygen, RW, 3:1 and oxygen through [employer].  The patient plans to have her daughter bring her portable oxygen tank from home.  The patient has a nebulizer at home.  Patient states she quit smoking 10 years ago.  Patient declines use of ETOH and drugs.  No other TOC needs.  Bedside nursing will discharge the patient home with her daughter by car.   Transition of Care Asessment: Insurance and Status: (P) Insurance coverage has been reviewed Patient has primary care physician: (P) Yes Home environment has been reviewed: (P) From home with daughter Prior level of function:: (P) Independent with care assistance from daughter Prior/Current Home Services: (P) No current home services Social Determinants of Health Reivew: (P) SDOH reviewed interventions complete Readmission risk has been reviewed: (P) Yes Transition of care needs: (P) transition of care needs identified, TOC will continue to follow

## 2023-10-17 NOTE — Progress Notes (Signed)
Physical Therapy Treatment Patient Details Name: [name] MRN: [id number] DOB: [birth year] Today's Date: 10/17/2023   History of Present Illness 72 yo female admitted on 10/12 with acute on chronic hypercarbic and hypoxic resp failure, in setting of COPD and PNA. Hospital course complicated by ICU delirium.  PMHx: COPD, CHF, bronchiectasis, pulm nodules, nicotine addiction, heart murmur, pyelonephritis, e coli, bacteremia, HTN.    PT Comments  Pt progressing well with all activity able to walk in hall with RW and perform stairs. PT on 4L with activity and 3L at rest with SPO2 87-95%. PT educated for energy conservation and pursed lip breathing with good awareness and carryover. HHPT appropriate. Will continue to follow.      If plan is discharge home, recommend the following: Help with stairs or ramp for entrance;Assist for transportation;Assistance with cooking/housework;A little help with walking and/or transfers;A little help with bathing/dressing/bathroom   Can travel by private vehicle     Yes  Equipment Recommendations  None recommended by PT    Recommendations for Other Services       Precautions / Restrictions Precautions Precautions: Fall;Other (comment) Precaution Comments: watch sats, on 3.5-4LO2 chronically at home     Mobility  Bed Mobility Overal bed mobility: Modified Independent             General bed mobility comments: HOB 35 degrees with pt able to pivot to EOB without assist    Transfers Overall transfer level: Needs assistance   Transfers: Sit to/from Stand Sit to Stand: Contact guard assist           General transfer comment: cues for hand placement and safety. PT stood from bed then from recliner x 6 trials with SPO2 94% during repeated trials on 4L    Ambulation/Gait Ambulation/Gait assistance: Contact guard assist Gait Distance (Feet): 110 Feet Assistive device: Rolling walker (2 wheels) Gait Pattern/deviations: Step-through  pattern, Decreased stride length   Gait velocity interpretation: <1.8 ft/sec, indicate of risk for recurrent falls   General Gait Details: pt with slow controlled gait for energy conservation with cues for pursed lip breathing on 4L with SPO2 89-94%   Stairs Stairs: Yes Stairs assistance: Contact guard assist Stair Management: Alternating pattern, Forwards, Two rails Number of Stairs: 3 General stair comments: reliant on bil rails with good stability and standing rest after stairs due to desaturation to 87% on 4L, recovered within 1 min of standing   Wheelchair Mobility     Tilt Bed    Modified Rankin (Stroke Patients Only)       Balance Overall balance assessment: Needs assistance   Sitting balance-Leahy Scale: Fair     Standing balance support: Bilateral upper extremity supported, Reliant on assistive device for balance, During functional activity Standing balance-Leahy Scale: Poor Standing balance comment: RW in standing                            Cognition Arousal: Alert Behavior During Therapy: WFL for tasks assessed/performed Overall Cognitive Status: Within Functional Limits for tasks assessed                                          Exercises      General Comments        Pertinent Vitals/Pain Pain Assessment Pain Assessment: No/denies pain    Home Living Family/patient expects to be  discharged to:: Private residence Living Arrangements: Children                      Prior Function            PT Goals (current goals can now be found in the care plan section) Progress towards PT goals: Progressing toward goals    Frequency    Min 1X/week      PT Plan      Co-evaluation              AM-PAC PT "6 Clicks" Mobility   Outcome Measure  Help needed turning from your back to your side while in a flat bed without using bedrails?: None Help needed moving from lying on your back to sitting on the  side of a flat bed without using bedrails?: None Help needed moving to and from a bed to a chair (including a wheelchair)?: A Little Help needed standing up from a chair using your arms (e.g., wheelchair or bedside chair)?: A Little Help needed to walk in hospital room?: A Little Help needed climbing 3-5 steps with a railing? : A Little 6 Click Score: 20    End of Session Equipment Utilized During Treatment: Oxygen Activity Tolerance: Patient tolerated treatment well Patient left: in chair;with call bell/phone within reach;with chair alarm set Nurse Communication: Mobility status PT Visit Diagnosis: Other abnormalities of gait and mobility (R26.89);Muscle weakness (generalized) (M62.81)     Time: 2536-6440 PT Time Calculation (min) (ACUTE ONLY): 30 min  Charges:    $Gait Training: 8-22 mins $Therapeutic Activity: 8-22 mins PT General Charges $$ ACUTE PT VISIT: 1 Visit                     Merryl Hacker, PT Acute Rehabilitation Services Office: [phone]    Rivaldo Hineman B Avalene Sealy 10/17/2023, 10:20 AM

## 2023-10-17 NOTE — Discharge Summary (Signed)
[facility] Physician Discharge Summary   Patient name: [name]  Admit date:     10/07/2023  Discharge date: 10/17/2023  Attending Physician: Tomma Lightning [[id number]]  Discharge Physician: Carollee Herter   PCP: Benita Stabile, MD  Admitted From: Home  Disposition:  Home. Pt declined SNF placement.  Recommendations for Outpatient Follow-up:  Follow up with PCP in 1-2 weeks following hospital discharge. Follow up with pulmonology in 1-2 weeks following hospital discharge  Home Health:Yes. Home PT Equipment/Devices: Home nebulizer machine Oxygen 3.5 L/min at rest and 4.5 L/min with activity(pt already had home O2 prior to admission)  Discharge Condition:Stable CODE STATUS:FULL Diet recommendation: Heart Healthy Fluid Restriction: None  Hospital Summary: HPI: Activated EMS for not feeling well Has been started on antibiotics and steroids by Dr. Octavio Graves on 10/10 Activated EMS with increased work of breathing, saturations noted to be 50% on her usual 2 L of oxygen was given Breathing treatments and brought to the hospital Started on BiPAP Worsening ABG  Admitted to [facility] service on 10-07-2023.  Significant Events: Admitted 10/07/2023 to PCCM for acute on chronic respiratory failure 10/12 acute on chronic hypoxic respiratory failure with failed outpatient therapies including steroids  CT scan chest 07/23/2023-pleural calcification, fibrosis, emphysema PFT 06/02/2016 severe obstructive lung disease ABG 10/07/2023 7.21/123/167 10/13 persistent hypercapnia despite ongoing BiPAP use 10/14 was apparently agitated overnight and received prn Ativan and on precedex, sedated on bipap  10/16 early AM, BiPAP changed to AVAPS to target MV 12-15 10-14-2023 transferred to [facility] service  Significant Labs:   Significant Imaging Studies:   Antibiotic Therapy: Anti-infectives (From admission, onward)    Start     Dose/Rate Route Frequency Ordered Stop   10/08/23 1600   vancomycin (VANCOCIN) IVPB 1000 mg/200 mL premix  Status:  Discontinued        1,000 mg 200 mL/hr over 60 Minutes Intravenous Every 24 hours 10/07/23 1900 10/08/23 1002   10/07/23 2100  ceFEPIme (MAXIPIME) 2 g in sodium chloride 0.9 % 100 mL IVPB        2 g 200 mL/hr over 30 Minutes Intravenous Every 8 hours 10/07/23 1708 10/11/23 2231   10/07/23 1300  vancomycin (VANCOREADY) IVPB 1750 mg/350 mL        1,750 mg 175 mL/hr over 120 Minutes Intravenous  Once 10/07/23 1245 10/07/23 1834   10/07/23 1245  ceFEPIme (MAXIPIME) 2 g in sodium chloride 0.9 % 100 mL IVPB        2 g 200 mL/hr over 30 Minutes Intravenous  Once 10/07/23 1231 10/07/23 1356       Procedures:   Consultants: [facility] Course by Problem: * Acute hypoxic on chronic hypercapnic respiratory failure (HCC) - 3.5 L/min at rest, 4.5 L/min with activity 10-07-2023. Admitted to PCCM on Bipap. Improved with ventilation of elevated PCO2. Transferred to [facility] on 10-14-2023 10-16-2023 pt normally on 3.5 L/min at rest. 10-17-2023 stable on 3.5 L/min at rest  PAF (paroxysmal atrial fibrillation) (HCC) Thought to be due to infection/respiratory distress. Back in NSR. Decision made not to pursue systemic anticoagulation.  CAP (community acquired pneumonia) Pt treated by PCCM team with 5 days of IV cefepime. Treatment completed.  Chronic diastolic CHF (congestive heart failure) (HCC) Treated with IV lasix for CHF exacerbation. Currently not on diuretics due to concern about contraction alkalosis and causing increase in her already elevated serum bicarb(from chronic CO2 retention).  Acute metabolic encephalopathy Due to elevated PCO2. Resolved now.  Acute on chronic diastolic  CHF (congestive heart failure) (HCC) Treated with IV lasix. Not on scheduled lasix due to concern about contraction alkalosis and baseline elevated serum bicarb from chronic CO2 retention.  Anxiety On chronic xanax.  Essential hypertension Stable.  On lopressor 25 mg bid. 10-17-2023 pt with chronic bicarb elevation due to chronic CO2 retention. In effort not to cause increase in serum bicarb from contraction alkalosis from diuretics, pt's HTN meds changed to lopressor. Also give pt's recent loose stools from laxatives, have stopped ARB to prevent AKI.  COPD with acute exacerbation (HCC) Treated with IV steroids and nebs. Resolved acute exacerbation.  10-17-2023 will discharge on duonebs qid and pulmicort nebs bid. Home health nebulizer machine ordered for patient. Pt refused SNF placement and therefore pt was discharge to home.  Therapeutic opioid-induced constipation (OIC) Pt treated with colace and miralax. Now with diarrhea 10-16-2023. Laxatives held. Given 1 dose of immodium  GERD (gastroesophageal reflux disease) Stable. On protonix.  Nicotine addiction Advised pt to stop smoking.  Sepsis (HCC) Present on admission. Due to COPD Exacerbation, acute on chronic respiratory failure, CAP. Now resolved.  Chronic respiratory failure with hypoxia and hypercapnia (HCC) - on 3.5 L/min @ rest, 4.5 L/min with activity. baseline PCO2 75-80 mmHg Chronic. On continuous O2 @ 3.5 L/min at rest, 4.5 L/min with activity. Chronically elevated PCO2 @ 75-80 mm Hg  Discharge VBG pH 7.4, PCO2 79 mmHg    Discharge Diagnoses:  Principal Problem:   Acute hypoxic on chronic hypercapnic respiratory failure (HCC) - 3.5 L/min at rest, 4.5 L/min with activity Active Problems:   Nicotine addiction   GERD (gastroesophageal reflux disease)   Therapeutic opioid-induced constipation (OIC)   COPD with acute exacerbation (HCC)   Essential hypertension   Anxiety   Acute on chronic diastolic CHF (congestive heart failure) (HCC)   Acute metabolic encephalopathy   Chronic diastolic CHF (congestive heart failure) (HCC)   CAP (community acquired pneumonia)   PAF (paroxysmal atrial fibrillation) (HCC)   Chronic respiratory failure with hypoxia and  hypercapnia (HCC) - on 3.5 L/min @ rest, 4.5 L/min with activity. baseline PCO2 75-80 mmHg   Sepsis [facility])   Discharge Instructions  Discharge Instructions     Call MD for:  difficulty breathing, headache or visual disturbances   Complete by: As directed    Call MD for:  extreme fatigue   Complete by: As directed    Call MD for:  hives   Complete by: As directed    Call MD for:  persistant dizziness or light-headedness   Complete by: As directed    Call MD for:  persistant nausea and vomiting   Complete by: As directed    Call MD for:  redness, tenderness, or signs of infection (pain, swelling, redness, odor or green/yellow discharge around incision site)   Complete by: As directed    Call MD for:  temperature >100.4   Complete by: As directed    Diet - low sodium heart healthy   Complete by: As directed    Discharge instructions   Complete by: As directed    1. Followup with your PCP in 1-2 weeks following hospital discharge 2. Followup with your pulmonologist in 1-2  weeks following hospital discharge.   Increase activity slowly   Complete by: As directed       Allergies as of 10/17/2023       Reactions   Neurontin [gabapentin] Other (See Comments)   Lightheadedness  Groggy         Medication  List     STOP taking these medications    ciprofloxacin 500 MG tablet Commonly known as: CIPRO   losartan 50 MG tablet Commonly known as: COZAAR   predniSONE 10 MG tablet Commonly known as: DELTASONE       TAKE these medications    albuterol 108 (90 Base) MCG/ACT inhaler Commonly known as: VENTOLIN HFA Inhale 2 puffs into the lungs every 4 (four) hours as needed for wheezing or shortness of breath. For shortness of breath   ALPRAZolam 0.25 MG tablet Commonly known as: XANAX Take 0.25 mg by mouth 2 (two) times daily.   aspirin EC 81 MG tablet Take 81 mg by mouth daily.   atorvastatin 80 MG tablet Commonly known as: LIPITOR Take 1 tablet (80 mg total) by  mouth daily.   bethanechol 10 MG tablet Commonly known as: URECHOLINE Take 1 tablet (10 mg total) by mouth in the morning and at bedtime. What changed:  how much to take when to take this   budesonide 0.25 MG/2ML nebulizer solution Commonly known as: PULMICORT Take 2 mLs (0.25 mg total) by nebulization 2 (two) times daily.   CENTRUM ADULT PO Take 1 tablet by mouth daily.   escitalopram 5 MG tablet Commonly known as: LEXAPRO Take 5 mg by mouth daily.   furosemide 20 MG tablet Commonly known as: LASIX Take 1 tablet (20 mg total) by mouth daily. What changed:  when to take this reasons to take this   HYDROcodone-acetaminophen 10-325 MG tablet Commonly known as: NORCO Take 1 tablet by mouth See admin instructions. 1 tablet every 3-4 hours as needed for pain.   ipratropium-albuterol 0.5-2.5 (3) MG/3ML Soln Commonly known as: DUONEB Take 3 mLs by nebulization in the morning, at noon, in the evening, and at bedtime.   metoprolol tartrate 25 MG tablet Commonly known as: LOPRESSOR Take 1 tablet (25 mg total) by mouth 2 (two) times daily.   pantoprazole 20 MG tablet Commonly known as: PROTONIX Take 1 tablet (20 mg total) by mouth daily.   tamsulosin 0.4 MG Caps capsule Commonly known as: FLOMAX Take 1 capsule (0.4 mg total) by mouth daily after supper. What changed: when to take this        Allergies  Allergen Reactions   Neurontin [Gabapentin] Other (See Comments)    Lightheadedness  Groggy     Discharge Exam: Vitals:   10/17/23 0405 10/17/23 0753  BP: (!) 150/55 (!) 139/48  Pulse: 72 89  Resp: 17 16  Temp: 98.5 F (36.9 C) 98.5 F (36.9 C)  SpO2: 98% 98%    Physical Exam Vitals and nursing note reviewed.  Constitutional:      General: She is not in acute distress.    Appearance: She is not toxic-appearing or diaphoretic.     Comments: Chronically ill appearing  HENT:     Head: Normocephalic and atraumatic.     Nose: Nose normal.  Eyes:      General: No scleral icterus. Cardiovascular:     Rate and Rhythm: Normal rate and regular rhythm.  Pulmonary:     Effort: Pulmonary effort is normal. No respiratory distress.     Breath sounds: No wheezing.  Abdominal:     General: Bowel sounds are normal. There is no distension.  Musculoskeletal:     Right lower leg: No edema.     Left lower leg: No edema.  Skin:    General: Skin is warm and dry.     Capillary Refill: Capillary refill  takes less than 2 seconds.  Neurological:     General: No focal deficit present.     Mental Status: She is alert and oriented to person, place, and time.     The results of significant diagnostics from this hospitalization (including imaging, microbiology, ancillary and laboratory) are listed below for reference.    Microbiology: Recent Results (from the past 240 hour(s))  Resp panel by RT-PCR (RSV, Flu A&B, Covid) Anterior Nasal Swab     Status: None   Collection Time: 10/07/23 11:39 AM   Specimen: Anterior Nasal Swab  Result Value Ref Range Status   SARS Coronavirus 2 by RT PCR NEGATIVE NEGATIVE Final   Influenza A by PCR NEGATIVE NEGATIVE Final   Influenza B by PCR NEGATIVE NEGATIVE Final    Comment: (NOTE) The Xpert Xpress SARS-CoV-2/FLU/RSV plus assay is intended as an aid in the diagnosis of influenza from Nasopharyngeal swab specimens and should not be used as a sole basis for treatment. Nasal washings and aspirates are unacceptable for Xpert Xpress SARS-CoV-2/FLU/RSV testing.  Fact Sheet for Patients: BloggerCourse.com  Fact Sheet for Healthcare Providers: SeriousBroker.it  This test is not yet approved or cleared by the [place] FDA and has been authorized for detection and/or diagnosis of SARS-CoV-2 by FDA under an Emergency Use Authorization (EUA). This EUA will remain in effect (meaning this test can be used) for the duration of the COVID-19 declaration under Section  564(b)(1) of the Act, 21 U.S.C. section 360bbb-3(b)(1), unless the authorization is terminated or revoked.     Resp Syncytial Virus by PCR NEGATIVE NEGATIVE Final    Comment: (NOTE) Fact Sheet for Patients: BloggerCourse.com  Fact Sheet for Healthcare Providers: SeriousBroker.it  This test is not yet approved or cleared by the [place] FDA and has been authorized for detection and/or diagnosis of SARS-CoV-2 by FDA under an Emergency Use Authorization (EUA). This EUA will remain in effect (meaning this test can be used) for the duration of the COVID-19 declaration under Section 564(b)(1) of the Act, 21 U.S.C. section 360bbb-3(b)(1), unless the authorization is terminated or revoked.  Performed at [facility], [address], [place], [place] [address]   MRSA Next Gen by PCR, Nasal     Status: None   Collection Time: 10/07/23  6:30 PM   Specimen: Nasal Mucosa; Nasal Swab  Result Value Ref Range Status   MRSA by PCR Next Gen NOT DETECTED NOT DETECTED Final    Comment: (NOTE) The GeneXpert MRSA Assay (FDA approved for NASAL specimens only), is one component of a comprehensive MRSA colonization surveillance program. It is not intended to diagnose MRSA infection nor to guide or monitor treatment for MRSA infections. Test performance is not FDA approved in patients less than 51 years old. Performed at [facility], [address] [place], [place] [address]     Labs: BNP (last 3 results) Recent Labs    07/19/23 0358 10/07/23 1148  BNP 425.4* 895.2*   Basic Metabolic Panel: Recent Labs  Lab 10/11/23 0254 10/11/23 0538 10/11/23 2258 10/12/23 0234 10/13/23 0406 10/17/23 0546  NA 137 134* 138 138 140 143  K 3.8 3.7 3.7 3.8 4.2 4.3  CL 85*  --  86* 89* 90* 92*  CO2 43*  --  44* 42* 43* 37*  GLUCOSE 87  --  134* 140* 101* 141*  BUN 40*  --  32* 30* 24* 11  CREATININE 1.04*  --  0.75 0.59 0.58 0.49   CALCIUM 8.5*  --  9.1 8.3* 8.8* 8.8*  MG  --   --  2.3 2.2 2.3  --   PHOS  --   --  2.4* 3.1 3.5  --    Liver Function Tests: Recent Labs  Lab 10/17/23 0546  AST 20  ALT 23  ALKPHOS 49  BILITOT 0.6  PROT 5.2*  ALBUMIN 3.0*   CBC: Recent Labs  Lab 10/11/23 0254 10/11/23 0538 10/12/23 0234 10/13/23 0406  WBC 16.1*  --  10.1 7.4  NEUTROABS 11.7*  --   --   --   HGB 10.6* 12.6 10.2* 10.1*  HCT 38.0 37.0 34.8* 36.2  MCV 102.7*  --  102.4* 101.4*  PLT 235  --  161 140*   CBG: Recent Labs  Lab 10/10/23 1958 10/10/23 2312 10/11/23 0331 10/11/23 2240  GLUCAP 91 72 83 123*   Sepsis Labs Recent Labs  Lab 10/11/23 0254 10/12/23 0234 10/13/23 0406  WBC 16.1* 10.1 7.4   Microbiology Recent Results (from the past 240 hour(s))  Resp panel by RT-PCR (RSV, Flu A&B, Covid) Anterior Nasal Swab     Status: None   Collection Time: 10/07/23 11:39 AM   Specimen: Anterior Nasal Swab  Result Value Ref Range Status   SARS Coronavirus 2 by RT PCR NEGATIVE NEGATIVE Final   Influenza A by PCR NEGATIVE NEGATIVE Final   Influenza B by PCR NEGATIVE NEGATIVE Final    Comment: (NOTE) The Xpert Xpress SARS-CoV-2/FLU/RSV plus assay is intended as an aid in the diagnosis of influenza from Nasopharyngeal swab specimens and should not be used as a sole basis for treatment. Nasal washings and aspirates are unacceptable for Xpert Xpress SARS-CoV-2/FLU/RSV testing.  Fact Sheet for Patients: BloggerCourse.com  Fact Sheet for Healthcare Providers: SeriousBroker.it  This test is not yet approved or cleared by the [place] FDA and has been authorized for detection and/or diagnosis of SARS-CoV-2 by FDA under an Emergency Use Authorization (EUA). This EUA will remain in effect (meaning this test can be used) for the duration of the COVID-19 declaration under Section 564(b)(1) of the Act, 21 U.S.C. section 360bbb-3(b)(1), unless the  authorization is terminated or revoked.     Resp Syncytial Virus by PCR NEGATIVE NEGATIVE Final    Comment: (NOTE) Fact Sheet for Patients: BloggerCourse.com  Fact Sheet for Healthcare Providers: SeriousBroker.it  This test is not yet approved or cleared by the [place] FDA and has been authorized for detection and/or diagnosis of SARS-CoV-2 by FDA under an Emergency Use Authorization (EUA). This EUA will remain in effect (meaning this test can be used) for the duration of the COVID-19 declaration under Section 564(b)(1) of the Act, 21 U.S.C. section 360bbb-3(b)(1), unless the authorization is terminated or revoked.  Performed at [facility], [address], [place], [place] [address]   MRSA Next Gen by PCR, Nasal     Status: None   Collection Time: 10/07/23  6:30 PM   Specimen: Nasal Mucosa; Nasal Swab  Result Value Ref Range Status   MRSA by PCR Next Gen NOT DETECTED NOT DETECTED Final    Comment: (NOTE) The GeneXpert MRSA Assay (FDA approved for NASAL specimens only), is one component of a comprehensive MRSA colonization surveillance program. It is not intended to diagnose MRSA infection nor to guide or monitor treatment for MRSA infections. Test performance is not FDA approved in patients less than 75 years old. Performed at [facility], [address], [place], [place] [address]     Procedures/Studies:  ECHOCARDIOGRAM COMPLETE  Result Date: 10/12/2023    ECHOCARDIOGRAM REPORT   Patient Name:   [name] Date of Exam: 10/12/2023 Medical Rec #:  [id number]        Height:       63.0 in Accession #:    [id number]       Weight:       184.5 lb Date of Birth:  26-Oct-1951        BSA:          1.869 m Patient Age:    72 years         BP:           104/50 mmHg Patient Gender: F                HR:           72 bpm. Exam Location:  Inpatient Procedure: 2D Echo, Cardiac Doppler and Color Doppler Indications:     Atrial Flutter I48.92  History:        Patient has prior history of Echocardiogram examinations, most                 recent 07/19/2023. CHF, COPD; Risk Factors:Hypertension.  Sonographer:    Lucendia Herrlich RCS Referring Phys: [id number] [facility]  Sonographer Comments: No parasternal window and suboptimal apical window. IMPRESSIONS  1. Left ventricular ejection fraction, by estimation, is 60 to 65%. The left ventricle has normal function. The left ventricle has no regional wall motion abnormalities. There is mild concentric left ventricular hypertrophy. Left ventricular diastolic parameters are consistent with Grade I diastolic dysfunction (impaired relaxation).  2. Right ventricular systolic function is normal. The right ventricular size is normal. There is moderately elevated pulmonary artery systolic pressure. The estimated right ventricular systolic pressure is 46.3 mmHg.  3. Left atrial size was moderately dilated.  4. Right atrial size was mildly dilated.  5. The mitral valve is normal in structure. Trivial mitral valve regurgitation. No evidence of mitral stenosis.  6. The aortic valve is tricuspid. Aortic valve regurgitation is not visualized. Aortic valve sclerosis/calcification is present, without any evidence of aortic stenosis.  7. The inferior vena cava is normal in size with greater than 50% respiratory variability, suggesting right atrial pressure of 3 mmHg. FINDINGS  Left Ventricle: Left ventricular ejection fraction, by estimation, is 60 to 65%. The left ventricle has normal function. The left ventricle has no regional wall motion abnormalities. The left ventricular internal cavity size was normal in size. There is  mild concentric left ventricular hypertrophy. Left ventricular diastolic parameters are consistent with Grade I diastolic dysfunction (impaired relaxation). Right Ventricle: The right ventricular size is normal. No increase in right ventricular wall thickness. Right ventricular  systolic function is normal. There is moderately elevated pulmonary artery systolic pressure. The tricuspid regurgitant velocity is 3.29 m/s, and with an assumed right atrial pressure of 3 mmHg, the estimated right ventricular systolic pressure is 46.3 mmHg. Left Atrium: Left atrial size was moderately dilated. Right Atrium: Right atrial size was mildly dilated. Pericardium: There is no evidence of pericardial effusion. Mitral Valve: The mitral valve is normal in structure. Mild mitral annular calcification. Trivial mitral valve regurgitation. No evidence of mitral valve stenosis. Tricuspid Valve: The tricuspid valve is normal in structure. Tricuspid valve regurgitation is mild . No evidence of tricuspid stenosis. Aortic Valve: The aortic valve is tricuspid. Aortic valve regurgitation is not visualized. Aortic valve sclerosis/calcification is present, without any evidence of aortic stenosis. Aortic  valve mean gradient measures 8.0 mmHg. Aortic valve peak gradient measures 16.5 mmHg. Pulmonic Valve: The pulmonic valve was normal in structure. Pulmonic valve regurgitation is not visualized. No evidence of pulmonic stenosis. Aorta: The aortic root is normal in size and structure. Venous: The inferior vena cava is normal in size with greater than 50% respiratory variability, suggesting right atrial pressure of 3 mmHg. IAS/Shunts: No atrial level shunt detected by color flow Doppler.   Diastology LV e' medial:    5.43 cm/s LV E/e' medial:  18.3 LV e' lateral:   6.57 cm/s LV E/e' lateral: 15.1  IVC IVC diam: 1.00 cm LEFT ATRIUM           Index        RIGHT ATRIUM           Index LA Vol (A4C): 67.4 ml 36.07 ml/m  RA Area:     21.50 cm                                    RA Volume:   66.00 ml  35.32 ml/m  AORTIC VALVE AV Vmax:           203.00 cm/s AV Vmean:          133.000 cm/s AV VTI:            0.385 m AV Peak Grad:      16.5 mmHg AV Mean Grad:      8.0 mmHg LVOT Vmax:         127.00 cm/s LVOT Vmean:        82.800  cm/s LVOT VTI:          0.239 m LVOT/AV VTI ratio: 0.62 MITRAL VALVE                TRICUSPID VALVE MV Area (PHT): 3.21 cm     TR Peak grad:   43.3 mmHg MV Decel Time: 236 msec     TR Vmax:        329.00 cm/s MR Peak grad: 10.2 mmHg MR Vmax:      160.00 cm/s   SHUNTS MV E velocity: 99.40 cm/s   Systemic VTI: 0.24 m MV A velocity: 106.00 cm/s MV E/A ratio:  0.94 Arvilla Meres MD Electronically signed by Arvilla Meres MD Signature Date/Time: 10/12/2023/1:13:57 PM    Final    DG CHEST PORT 1 VIEW  Result Date: 10/11/2023 CLINICAL DATA:  Respiratory distress, hypercapnia. EXAM: PORTABLE CHEST 1 VIEW COMPARISON:  October 07, 2023. FINDINGS: Stable cardiomegaly. Stable bibasilar opacities are noted concerning for atelectasis or edema. Small pleural effusions are noted. Bony thorax is unremarkable. IMPRESSION: Stable bibasilar opacities as noted above. Electronically Signed   By: Lupita Raider M.D.   On: 10/11/2023 10:55   DG Chest Portable 1 View  Result Date: 10/07/2023 CLINICAL DATA:  Respiratory distress EXAM: PORTABLE CHEST 1 VIEW COMPARISON:  Chest radiograph dated 07/19/2023. FINDINGS: The heart is enlarged. Vascular calcifications are seen in the aortic arch. There are small bilateral pleural effusions with associated atelectasis/airspace disease. No pneumothorax. Degenerative changes are seen in the spine. IMPRESSION: Small bilateral pleural effusions with associated atelectasis/airspace disease. Electronically Signed   By: Romona Curls M.D.   On: 10/07/2023 12:23    Time coordinating discharge: 40 mins  SIGNED:  Carollee Herter, DO [facility] 10/17/23, 8:05 AM

## 2023-10-17 NOTE — Plan of Care (Signed)
  Problem: Education: Goal: Knowledge of General Education information will improve Description: Including pain rating scale, medication(s)/side effects and non-pharmacologic comfort measures Outcome: Progressing   Problem: Health Behavior/Discharge Planning: Goal: Ability to manage health-related needs will improve Outcome: Progressing   Problem: Clinical Measurements: Goal: Ability to maintain clinical measurements within normal limits will improve Outcome: Progressing Goal: Will remain free from infection Outcome: Progressing Goal: Diagnostic test results will improve Outcome: Progressing Goal: Respiratory complications will improve Outcome: Progressing Goal: Cardiovascular complication will be avoided Outcome: Progressing   Problem: Activity: Goal: Risk for activity intolerance will decrease Outcome: Progressing   Problem: Nutrition: Goal: Adequate nutrition will be maintained Outcome: Progressing   Problem: Coping: Goal: Level of anxiety will decrease Outcome: Progressing   Problem: Elimination: Goal: Will not experience complications related to bowel motility Outcome: Progressing Goal: Will not experience complications related to urinary retention Outcome: Progressing   Problem: Pain Managment: Goal: General experience of comfort will improve Outcome: Progressing   Problem: Safety: Goal: Ability to remain free from injury will improve Outcome: Progressing   Problem: Skin Integrity: Goal: Risk for impaired skin integrity will decrease Outcome: Progressing   Problem: Activity: Goal: Ability to tolerate increased activity will improve Outcome: Progressing   Problem: Clinical Measurements: Goal: Ability to maintain a body temperature in the normal range will improve Outcome: Progressing   Problem: Respiratory: Goal: Ability to maintain adequate ventilation will improve Outcome: Progressing Goal: Ability to maintain a clear airway will improve Outcome:  Progressing   Problem: Education: Goal: Knowledge of disease or condition will improve Outcome: Progressing Goal: Knowledge of the prescribed therapeutic regimen will improve Outcome: Progressing Goal: Individualized Educational Video(s) Outcome: Progressing   Problem: Activity: Goal: Ability to tolerate increased activity will improve Outcome: Progressing Goal: Will verbalize the importance of balancing activity with adequate rest periods Outcome: Progressing   Problem: Respiratory: Goal: Ability to maintain a clear airway will improve Outcome: Progressing Goal: Levels of oxygenation will improve Outcome: Progressing Goal: Ability to maintain adequate ventilation will improve Outcome: Progressing   Problem: Safety: Goal: Non-violent Restraint(s) Outcome: Progressing

## 2023-10-19 DIAGNOSIS — G8929 Other chronic pain: Secondary | ICD-10-CM | POA: Diagnosis not present

## 2023-10-19 DIAGNOSIS — D539 Nutritional anemia, unspecified: Secondary | ICD-10-CM | POA: Diagnosis not present

## 2023-10-19 DIAGNOSIS — J449 Chronic obstructive pulmonary disease, unspecified: Secondary | ICD-10-CM | POA: Diagnosis not present

## 2023-10-19 DIAGNOSIS — I5032 Chronic diastolic (congestive) heart failure: Secondary | ICD-10-CM | POA: Diagnosis not present

## 2023-10-19 DIAGNOSIS — Z9181 History of falling: Secondary | ICD-10-CM | POA: Diagnosis not present

## 2023-10-19 DIAGNOSIS — I480 Paroxysmal atrial fibrillation: Secondary | ICD-10-CM | POA: Diagnosis not present

## 2023-10-19 DIAGNOSIS — Z87440 Personal history of urinary (tract) infections: Secondary | ICD-10-CM | POA: Diagnosis not present

## 2023-10-19 DIAGNOSIS — Z79891 Long term (current) use of opiate analgesic: Secondary | ICD-10-CM | POA: Diagnosis not present

## 2023-10-19 DIAGNOSIS — K219 Gastro-esophageal reflux disease without esophagitis: Secondary | ICD-10-CM | POA: Diagnosis not present

## 2023-10-19 DIAGNOSIS — Z87891 Personal history of nicotine dependence: Secondary | ICD-10-CM | POA: Diagnosis not present

## 2023-10-19 DIAGNOSIS — S51812D Laceration without foreign body of left forearm, subsequent encounter: Secondary | ICD-10-CM | POA: Diagnosis not present

## 2023-10-19 DIAGNOSIS — Z96622 Presence of left artificial elbow joint: Secondary | ICD-10-CM | POA: Diagnosis not present

## 2023-10-19 DIAGNOSIS — Z9842 Cataract extraction status, left eye: Secondary | ICD-10-CM | POA: Diagnosis not present

## 2023-10-19 DIAGNOSIS — J9621 Acute and chronic respiratory failure with hypoxia: Secondary | ICD-10-CM | POA: Diagnosis not present

## 2023-10-19 DIAGNOSIS — J9622 Acute and chronic respiratory failure with hypercapnia: Secondary | ICD-10-CM | POA: Diagnosis not present

## 2023-10-19 DIAGNOSIS — E7800 Pure hypercholesterolemia, unspecified: Secondary | ICD-10-CM | POA: Diagnosis not present

## 2023-10-19 DIAGNOSIS — I110 Hypertensive heart disease with heart failure: Secondary | ICD-10-CM | POA: Diagnosis not present

## 2023-10-24 DIAGNOSIS — K219 Gastro-esophageal reflux disease without esophagitis: Secondary | ICD-10-CM | POA: Diagnosis not present

## 2023-10-24 DIAGNOSIS — I480 Paroxysmal atrial fibrillation: Secondary | ICD-10-CM | POA: Diagnosis not present

## 2023-10-24 DIAGNOSIS — D539 Nutritional anemia, unspecified: Secondary | ICD-10-CM | POA: Diagnosis not present

## 2023-10-24 DIAGNOSIS — I110 Hypertensive heart disease with heart failure: Secondary | ICD-10-CM | POA: Diagnosis not present

## 2023-10-24 DIAGNOSIS — J9621 Acute and chronic respiratory failure with hypoxia: Secondary | ICD-10-CM | POA: Diagnosis not present

## 2023-10-24 DIAGNOSIS — Z79891 Long term (current) use of opiate analgesic: Secondary | ICD-10-CM | POA: Diagnosis not present

## 2023-10-24 DIAGNOSIS — J9622 Acute and chronic respiratory failure with hypercapnia: Secondary | ICD-10-CM | POA: Diagnosis not present

## 2023-10-24 DIAGNOSIS — S51812D Laceration without foreign body of left forearm, subsequent encounter: Secondary | ICD-10-CM | POA: Diagnosis not present

## 2023-10-24 DIAGNOSIS — Z9181 History of falling: Secondary | ICD-10-CM | POA: Diagnosis not present

## 2023-10-24 DIAGNOSIS — E7800 Pure hypercholesterolemia, unspecified: Secondary | ICD-10-CM | POA: Diagnosis not present

## 2023-10-24 DIAGNOSIS — I5032 Chronic diastolic (congestive) heart failure: Secondary | ICD-10-CM | POA: Diagnosis not present

## 2023-10-24 DIAGNOSIS — Z87891 Personal history of nicotine dependence: Secondary | ICD-10-CM | POA: Diagnosis not present

## 2023-10-24 DIAGNOSIS — G8929 Other chronic pain: Secondary | ICD-10-CM | POA: Diagnosis not present

## 2023-10-24 DIAGNOSIS — Z87440 Personal history of urinary (tract) infections: Secondary | ICD-10-CM | POA: Diagnosis not present

## 2023-10-24 DIAGNOSIS — Z96622 Presence of left artificial elbow joint: Secondary | ICD-10-CM | POA: Diagnosis not present

## 2023-10-24 DIAGNOSIS — J449 Chronic obstructive pulmonary disease, unspecified: Secondary | ICD-10-CM | POA: Diagnosis not present

## 2023-10-24 DIAGNOSIS — Z9842 Cataract extraction status, left eye: Secondary | ICD-10-CM | POA: Diagnosis not present

## 2023-10-30 DIAGNOSIS — I480 Paroxysmal atrial fibrillation: Secondary | ICD-10-CM | POA: Diagnosis not present

## 2023-10-30 DIAGNOSIS — K219 Gastro-esophageal reflux disease without esophagitis: Secondary | ICD-10-CM | POA: Diagnosis not present

## 2023-10-30 DIAGNOSIS — E7800 Pure hypercholesterolemia, unspecified: Secondary | ICD-10-CM | POA: Diagnosis not present

## 2023-10-30 DIAGNOSIS — Z9842 Cataract extraction status, left eye: Secondary | ICD-10-CM | POA: Diagnosis not present

## 2023-10-30 DIAGNOSIS — Z87440 Personal history of urinary (tract) infections: Secondary | ICD-10-CM | POA: Diagnosis not present

## 2023-10-30 DIAGNOSIS — Z9181 History of falling: Secondary | ICD-10-CM | POA: Diagnosis not present

## 2023-10-30 DIAGNOSIS — J9621 Acute and chronic respiratory failure with hypoxia: Secondary | ICD-10-CM | POA: Diagnosis not present

## 2023-10-30 DIAGNOSIS — S51812D Laceration without foreign body of left forearm, subsequent encounter: Secondary | ICD-10-CM | POA: Diagnosis not present

## 2023-10-30 DIAGNOSIS — J9622 Acute and chronic respiratory failure with hypercapnia: Secondary | ICD-10-CM | POA: Diagnosis not present

## 2023-10-30 DIAGNOSIS — I5032 Chronic diastolic (congestive) heart failure: Secondary | ICD-10-CM | POA: Diagnosis not present

## 2023-10-30 DIAGNOSIS — I110 Hypertensive heart disease with heart failure: Secondary | ICD-10-CM | POA: Diagnosis not present

## 2023-10-30 DIAGNOSIS — J449 Chronic obstructive pulmonary disease, unspecified: Secondary | ICD-10-CM | POA: Diagnosis not present

## 2023-10-30 DIAGNOSIS — Z96622 Presence of left artificial elbow joint: Secondary | ICD-10-CM | POA: Diagnosis not present

## 2023-10-30 DIAGNOSIS — D539 Nutritional anemia, unspecified: Secondary | ICD-10-CM | POA: Diagnosis not present

## 2023-10-30 DIAGNOSIS — G8929 Other chronic pain: Secondary | ICD-10-CM | POA: Diagnosis not present

## 2023-10-30 DIAGNOSIS — Z79891 Long term (current) use of opiate analgesic: Secondary | ICD-10-CM | POA: Diagnosis not present

## 2023-10-30 DIAGNOSIS — Z87891 Personal history of nicotine dependence: Secondary | ICD-10-CM | POA: Diagnosis not present

## 2023-11-01 DIAGNOSIS — D539 Nutritional anemia, unspecified: Secondary | ICD-10-CM | POA: Diagnosis not present

## 2023-11-01 DIAGNOSIS — Z96622 Presence of left artificial elbow joint: Secondary | ICD-10-CM | POA: Diagnosis not present

## 2023-11-01 DIAGNOSIS — Z79891 Long term (current) use of opiate analgesic: Secondary | ICD-10-CM | POA: Diagnosis not present

## 2023-11-01 DIAGNOSIS — K219 Gastro-esophageal reflux disease without esophagitis: Secondary | ICD-10-CM | POA: Diagnosis not present

## 2023-11-01 DIAGNOSIS — J9621 Acute and chronic respiratory failure with hypoxia: Secondary | ICD-10-CM | POA: Diagnosis not present

## 2023-11-01 DIAGNOSIS — Z87891 Personal history of nicotine dependence: Secondary | ICD-10-CM | POA: Diagnosis not present

## 2023-11-01 DIAGNOSIS — I5032 Chronic diastolic (congestive) heart failure: Secondary | ICD-10-CM | POA: Diagnosis not present

## 2023-11-01 DIAGNOSIS — Z9842 Cataract extraction status, left eye: Secondary | ICD-10-CM | POA: Diagnosis not present

## 2023-11-01 DIAGNOSIS — G8929 Other chronic pain: Secondary | ICD-10-CM | POA: Diagnosis not present

## 2023-11-01 DIAGNOSIS — I480 Paroxysmal atrial fibrillation: Secondary | ICD-10-CM | POA: Diagnosis not present

## 2023-11-01 DIAGNOSIS — J449 Chronic obstructive pulmonary disease, unspecified: Secondary | ICD-10-CM | POA: Diagnosis not present

## 2023-11-01 DIAGNOSIS — I110 Hypertensive heart disease with heart failure: Secondary | ICD-10-CM | POA: Diagnosis not present

## 2023-11-01 DIAGNOSIS — J9622 Acute and chronic respiratory failure with hypercapnia: Secondary | ICD-10-CM | POA: Diagnosis not present

## 2023-11-01 DIAGNOSIS — Z87440 Personal history of urinary (tract) infections: Secondary | ICD-10-CM | POA: Diagnosis not present

## 2023-11-01 DIAGNOSIS — E7800 Pure hypercholesterolemia, unspecified: Secondary | ICD-10-CM | POA: Diagnosis not present

## 2023-11-01 DIAGNOSIS — Z9181 History of falling: Secondary | ICD-10-CM | POA: Diagnosis not present

## 2023-11-01 DIAGNOSIS — S51812D Laceration without foreign body of left forearm, subsequent encounter: Secondary | ICD-10-CM | POA: Diagnosis not present

## 2023-11-06 DIAGNOSIS — J9621 Acute and chronic respiratory failure with hypoxia: Secondary | ICD-10-CM | POA: Diagnosis not present

## 2023-11-06 DIAGNOSIS — Z9842 Cataract extraction status, left eye: Secondary | ICD-10-CM | POA: Diagnosis not present

## 2023-11-06 DIAGNOSIS — J9622 Acute and chronic respiratory failure with hypercapnia: Secondary | ICD-10-CM | POA: Diagnosis not present

## 2023-11-06 DIAGNOSIS — I5032 Chronic diastolic (congestive) heart failure: Secondary | ICD-10-CM | POA: Diagnosis not present

## 2023-11-06 DIAGNOSIS — Z87891 Personal history of nicotine dependence: Secondary | ICD-10-CM | POA: Diagnosis not present

## 2023-11-06 DIAGNOSIS — Z9181 History of falling: Secondary | ICD-10-CM | POA: Diagnosis not present

## 2023-11-06 DIAGNOSIS — Z96622 Presence of left artificial elbow joint: Secondary | ICD-10-CM | POA: Diagnosis not present

## 2023-11-06 DIAGNOSIS — G8929 Other chronic pain: Secondary | ICD-10-CM | POA: Diagnosis not present

## 2023-11-06 DIAGNOSIS — S51812D Laceration without foreign body of left forearm, subsequent encounter: Secondary | ICD-10-CM | POA: Diagnosis not present

## 2023-11-06 DIAGNOSIS — Z87440 Personal history of urinary (tract) infections: Secondary | ICD-10-CM | POA: Diagnosis not present

## 2023-11-06 DIAGNOSIS — K219 Gastro-esophageal reflux disease without esophagitis: Secondary | ICD-10-CM | POA: Diagnosis not present

## 2023-11-06 DIAGNOSIS — Z79891 Long term (current) use of opiate analgesic: Secondary | ICD-10-CM | POA: Diagnosis not present

## 2023-11-06 DIAGNOSIS — J449 Chronic obstructive pulmonary disease, unspecified: Secondary | ICD-10-CM | POA: Diagnosis not present

## 2023-11-06 DIAGNOSIS — D539 Nutritional anemia, unspecified: Secondary | ICD-10-CM | POA: Diagnosis not present

## 2023-11-06 DIAGNOSIS — I110 Hypertensive heart disease with heart failure: Secondary | ICD-10-CM | POA: Diagnosis not present

## 2023-11-06 DIAGNOSIS — I480 Paroxysmal atrial fibrillation: Secondary | ICD-10-CM | POA: Diagnosis not present

## 2023-11-06 DIAGNOSIS — E7800 Pure hypercholesterolemia, unspecified: Secondary | ICD-10-CM | POA: Diagnosis not present

## 2023-11-07 ENCOUNTER — Telehealth: Payer: Self-pay | Admitting: Internal Medicine

## 2023-11-07 NOTE — Telephone Encounter (Signed)
Pt having shortness of breath and wants to know if we can send her in something to her pharmacy  [facility] - [place], [place] - [address]

## 2023-11-07 NOTE — Telephone Encounter (Signed)
Called and spoke with patient, advised of recommendations per Dr. Sherene Sires.  There are no openings and she said she feels too bad to come into the office.  I recommended that she go the the ER, she stated the last time she did that, she waited 17 hours.  She said she would continue to do her breathing treatments.  Advised to go if she gets any worse.  She verbalized understanding.  Nothing further needed.

## 2023-11-07 NOTE — Telephone Encounter (Signed)
Sats are 80% on RA after warming her hand up.  I asked her to increase her liter flow and she said someone had to be there for her to do that.  She usually sees Dr. Sherene Sires in the [place] office.  She is requesting something be sent in to her pharmacy for her breathing.  Dr. Sherene Sires, please advise.  Thank you.

## 2023-11-07 NOTE — Telephone Encounter (Signed)
Nothing to offer over the phone - will need ov today worked in or go to ER / UC asap if no openings

## 2023-11-07 NOTE — Telephone Encounter (Signed)
Sob 2-3 days ago.  No coughing, negative covid test.  Sob with minimal exertion.  Her oxygen is on 3.5L and her sats are dropping below 88%.  During our call, her sat was 78%, hands were cold.  She sob right now.

## 2023-11-08 DIAGNOSIS — Z79891 Long term (current) use of opiate analgesic: Secondary | ICD-10-CM | POA: Diagnosis not present

## 2023-11-08 DIAGNOSIS — Z9181 History of falling: Secondary | ICD-10-CM | POA: Diagnosis not present

## 2023-11-08 DIAGNOSIS — Z96622 Presence of left artificial elbow joint: Secondary | ICD-10-CM | POA: Diagnosis not present

## 2023-11-08 DIAGNOSIS — E7800 Pure hypercholesterolemia, unspecified: Secondary | ICD-10-CM | POA: Diagnosis not present

## 2023-11-08 DIAGNOSIS — G8929 Other chronic pain: Secondary | ICD-10-CM | POA: Diagnosis not present

## 2023-11-08 DIAGNOSIS — Z87440 Personal history of urinary (tract) infections: Secondary | ICD-10-CM | POA: Diagnosis not present

## 2023-11-08 DIAGNOSIS — Z9842 Cataract extraction status, left eye: Secondary | ICD-10-CM | POA: Diagnosis not present

## 2023-11-08 DIAGNOSIS — K219 Gastro-esophageal reflux disease without esophagitis: Secondary | ICD-10-CM | POA: Diagnosis not present

## 2023-11-08 DIAGNOSIS — Z87891 Personal history of nicotine dependence: Secondary | ICD-10-CM | POA: Diagnosis not present

## 2023-11-08 DIAGNOSIS — S51812D Laceration without foreign body of left forearm, subsequent encounter: Secondary | ICD-10-CM | POA: Diagnosis not present

## 2023-11-08 DIAGNOSIS — I480 Paroxysmal atrial fibrillation: Secondary | ICD-10-CM | POA: Diagnosis not present

## 2023-11-08 DIAGNOSIS — J449 Chronic obstructive pulmonary disease, unspecified: Secondary | ICD-10-CM | POA: Diagnosis not present

## 2023-11-08 DIAGNOSIS — I110 Hypertensive heart disease with heart failure: Secondary | ICD-10-CM | POA: Diagnosis not present

## 2023-11-08 DIAGNOSIS — I5032 Chronic diastolic (congestive) heart failure: Secondary | ICD-10-CM | POA: Diagnosis not present

## 2023-11-08 DIAGNOSIS — J9622 Acute and chronic respiratory failure with hypercapnia: Secondary | ICD-10-CM | POA: Diagnosis not present

## 2023-11-08 DIAGNOSIS — D539 Nutritional anemia, unspecified: Secondary | ICD-10-CM | POA: Diagnosis not present

## 2023-11-08 DIAGNOSIS — J9621 Acute and chronic respiratory failure with hypoxia: Secondary | ICD-10-CM | POA: Diagnosis not present

## 2023-11-10 ENCOUNTER — Ambulatory Visit: Payer: Medicare Other | Admitting: Urology

## 2023-11-10 DIAGNOSIS — Z87440 Personal history of urinary (tract) infections: Secondary | ICD-10-CM | POA: Diagnosis not present

## 2023-11-10 DIAGNOSIS — R339 Retention of urine, unspecified: Secondary | ICD-10-CM

## 2023-11-10 MED ORDER — BETHANECHOL CHLORIDE 10 MG PO TABS: 20.0000 mg | ORAL_TABLET | Freq: Every day | ORAL | 3 refills | Status: DC

## 2023-11-10 MED ORDER — TAMSULOSIN HCL 0.4 MG PO CAPS: 0.4000 mg | ORAL_CAPSULE | Freq: Every day | ORAL | 3 refills | Status: DC

## 2023-11-10 NOTE — Progress Notes (Unsigned)
11/10/2023 1:24 PM   Chales Abrahams 08-22-1951 [id number]  Referring provider: Benita Stabile, MD [address] [place],  [place] [address]  Patient location: home Physician location: office I connected with  DARALYN CHASON on 11/10/23 by a video enabled telemedicine application and verified that I am speaking with the correct person using two identifiers.   I discussed the limitations of evaluation and management by telemedicine. The patient expressed understanding and agreed to proceed.     HPI: Ms Brozek is a 72yo here for followup for incomplete emptying and recurrent UTI. She has had 1 UTI in the past 15 months. She feels she is emptying well on flomax 0.4mg  daily and bethenacol 10mg  BID. No straining to urinate. Her urine stream remains strong. No dysuria or gross hematuria   PMH: Past Medical History:  Diagnosis Date   Acute respiratory failure with hypoxia and hypercapnia (HCC) 07/19/2023   Anxiety    Atrophic vaginitis 10/09/2015   Baker's cyst    left leg   Blood in urine 10/09/2015   CHF (congestive heart failure) (HCC)    COPD (chronic obstructive pulmonary disease) (HCC)    Heart murmur    Hematuria 10/09/2015   Hypertension    Nicotine addiction 04/07/2014   Oxygen dependent    4L/min [place] all the time   Pneumonia    Urinary retention 07/01/2020   Urinary urgency 01/25/2016   Vaginal bleeding 10/09/2015    Surgical History: Past Surgical History:  Procedure Laterality Date   ABDOMINAL HYSTERECTOMY     CATARACT EXTRACTION Left 03/2015   CESAREAN SECTION     COLONOSCOPY  10/19/2004   Dr. Jena Gauss- internal hemorrhoids, o/w normal rectum, normal colon   COLONOSCOPY N/A 07/30/2014   [id number] diverticulosis. Colonic polyp-removed TA. repeat TCS 07/2021   COLONOSCOPY WITH PROPOFOL N/A 03/29/2021   diverticulosis in sigmoid and descending colon, one 5 mm polyp ascending colon (adenoma), non-bleeding internal hemorrhoids. 5 year surveillance    ESOPHAGOGASTRODUODENOSCOPY N/A 07/30/2014   [id number] EGD   POLYPECTOMY  03/29/2021   Procedure: POLYPECTOMY;  Surgeon: Corbin Ade, MD;  Location: [facility];  Service: Endoscopy;;   RADIAL HEAD ARTHROPLASTY Left 06/23/2017   Procedure: RADIAL HEAD ARTHROPLASTY WITH LIGAMENT REPAIR;  Surgeon: Cammy Copa, MD;  Location: [facility];  Service: Orthopedics;  Laterality: Left;    Home Medications:  Allergies as of 11/10/2023       Reactions   Neurontin [gabapentin] Other (See Comments)   Lightheadedness  Groggy         Medication List        Accurate as of November 10, 2023  1:24 PM. If you have any questions, ask your nurse or doctor.          albuterol 108 (90 Base) MCG/ACT inhaler Commonly known as: VENTOLIN HFA Inhale 2 puffs into the lungs every 4 (four) hours as needed for wheezing or shortness of breath. For shortness of breath   ALPRAZolam 0.25 MG tablet Commonly known as: XANAX Take 0.25 mg by mouth 2 (two) times daily.   aspirin EC 81 MG tablet Take 81 mg by mouth daily.   atorvastatin 80 MG tablet Commonly known as: LIPITOR Take 1 tablet (80 mg total) by mouth daily.   bethanechol 10 MG tablet Commonly known as: URECHOLINE Take 1 tablet (10 mg total) by mouth in the morning and at bedtime. What changed:  how much to take when to take this   budesonide 0.25  MG/2ML nebulizer solution Commonly known as: PULMICORT Take 2 mLs (0.25 mg total) by nebulization 2 (two) times daily.   CENTRUM ADULT PO Take 1 tablet by mouth daily.   escitalopram 5 MG tablet Commonly known as: LEXAPRO Take 5 mg by mouth daily.   furosemide 20 MG tablet Commonly known as: LASIX Take 1 tablet (20 mg total) by mouth daily. What changed:  when to take this reasons to take this   HYDROcodone-acetaminophen 10-325 MG tablet Commonly known as: NORCO Take 1 tablet by mouth See admin instructions. 1 tablet every 3-4 hours as needed for pain.   ipratropium-albuterol  0.5-2.5 (3) MG/3ML Soln Commonly known as: DUONEB Take 3 mLs by nebulization in the morning, at noon, in the evening, and at bedtime.   metoprolol tartrate 25 MG tablet Commonly known as: LOPRESSOR Take 1 tablet (25 mg total) by mouth 2 (two) times daily.   pantoprazole 20 MG tablet Commonly known as: PROTONIX Take 1 tablet (20 mg total) by mouth daily.   tamsulosin 0.4 MG Caps capsule Commonly known as: FLOMAX Take 1 capsule (0.4 mg total) by mouth daily after supper. What changed: when to take this        Allergies:  Allergies  Allergen Reactions   Neurontin [Gabapentin] Other (See Comments)    Lightheadedness  Groggy     Family History: Family History  Adopted: Yes  Problem Relation Age of Onset   Cancer Sister        pancreatic   Migraines Daughter    Cancer Daughter        pre cancerous cells on cervix   Cancer Sister        breast cancer, colon   Blindness Maternal Grandfather    Other Brother        murdered   Other Sister        ruptured colon   Cancer Sister        breast, skin   Other Brother        was in MVA   Cancer Sister        pancreatic   Hypertension Sister    Other Sister        ruptured colon   Colon cancer Neg Hx     Social History:  reports that she quit smoking about 10 years ago. Her smoking use included cigarettes. She started smoking about 50 years ago. She has been exposed to tobacco smoke. She has never used smokeless tobacco. She reports current alcohol use. She reports that she does not use drugs.  ROS: All other review of systems were reviewed and are negative except what is noted above in HPI   Laboratory Data: Lab Results  Component Value Date   WBC 10.5 10/17/2023   HGB 10.5 (L) 10/17/2023   HCT 36.1 10/17/2023   MCV 98.4 10/17/2023   PLT 165 10/17/2023    Lab Results  Component Value Date   CREATININE 0.49 10/17/2023    No results found for: "PSA"  No results found for: "TESTOSTERONE"  Lab Results   Component Value Date   HGBA1C 5.4 07/01/2022    Urinalysis    Component Value Date/Time   COLORURINE YELLOW 07/19/2023 1221   APPEARANCEUR Clear 09/21/2023 1507   LABSPEC 1.009 07/19/2023 1221   PHURINE 7.0 07/19/2023 1221   GLUCOSEU Negative 09/21/2023 1507   HGBUR NEGATIVE 07/19/2023 1221   BILIRUBINUR Negative 09/21/2023 1507   KETONESUR NEGATIVE 07/19/2023 1221   PROTEINUR Negative 09/21/2023 1507  PROTEINUR NEGATIVE 07/19/2023 1221   UROBILINOGEN 0.2 08/17/2011 1302   NITRITE Negative 09/21/2023 1507   NITRITE NEGATIVE 07/19/2023 1221   LEUKOCYTESUR Negative 09/21/2023 1507   LEUKOCYTESUR NEGATIVE 07/19/2023 1221    Lab Results  Component Value Date   LABMICR Comment 09/21/2023   WBCUA 0-5 08/08/2022   RBCUA 0-2 01/25/2016   LABEPIT 0-10 08/08/2022   MUCUS Present 01/25/2016   BACTERIA MANY (A) 07/11/2023    Pertinent Imaging:  Results for orders placed during the hospital encounter of 12/09/22  DG Abd 1 View  Narrative CLINICAL DATA:  Abdominal pain, history kidney stones  EXAM: ABDOMEN - 1 VIEW  COMPARISON:  07/03/2022  FINDINGS: Normal bowel gas pattern.  No bowel dilatation or bowel wall thickening.  No urinary tract calcifications.  Osseous structures unremarkable.  Chronic scarring and pleural calcifications at RIGHT lung base.  Clothing artifacts over upper mid abdomen.  IMPRESSION: No urinary tract calcifications or acute abnormalities.   Electronically Signed By: Ulyses Southward M.D. On: 12/09/2022 17:42  No results found for this or any previous visit.  No results found for this or any previous visit.  No results found for this or any previous visit.  Results for orders placed during the hospital encounter of 08/22/23  US RENAL  Narrative CLINICAL DATA:  Recent pyelonephritis, still symptomatic. Assess for obstruction.  EXAM: RENAL / URINARY TRACT ULTRASOUND COMPLETE  COMPARISON:  CT abdomen pelvis June 30, 2023  FINDINGS: Right Kidney:  Renal measurements: 9 x 4.7 x 5.3 cm = volume: 127.2 mL. Echogenicity within normal limits. No mass or hydronephrosis visualized.  Left Kidney:  Renal measurements: 9.7 x 5.3 x 4.3 cm = volume: 116.7 mL. Echogenicity within normal limits. No mass or hydronephrosis visualized.  Bladder:  Appears normal for degree of bladder distention. Right ureteral jet noted.  Other:  None.  IMPRESSION: Normal renal ultrasound.  No hydronephrosis identified bilaterally.   Electronically Signed By: Sherian Rein M.D. On: 08/22/2023 15:12  No valid procedures specified. No results found for this or any previous visit.  Results for orders placed during the hospital encounter of 06/24/20  CT RENAL STONE STUDY  Narrative CLINICAL DATA:  Urinary retention and bilateral flank pain.  EXAM: CT ABDOMEN AND PELVIS WITHOUT CONTRAST  TECHNIQUE: Multidetector CT imaging of the abdomen and pelvis was performed following the standard protocol without IV contrast.  COMPARISON:  CT scan and 03/20/2013  FINDINGS: Lower chest: Stable bibasilar scarring changes and evidence of prior right lower lobe lung surgery. The heart is normal in size. No pericardial effusion. Stable aortic calcifications.  Hepatobiliary: No hepatic lesions are identified without contrast. The gallbladder is grossly normal by CT. No common bile duct dilatation.  Pancreas: No mass, inflammation or ductal dilatation.  Spleen: Normal size. No focal lesions.  Adrenals/Urinary Tract: Stable left adrenal gland nodule measuring -3 Hounsfield units and consistent with benign adenoma. The right adrenal gland is normal.  No renal, ureteral or bladder calculi are identified. No worrisome renal lesions without contrast. The bladder is mildly distended but no bladder wall thickening, mass or calculi.  Stomach/Bowel: The stomach, duodenum, small bowel and colon are grossly normal without  oral contrast. No inflammatory changes, mass lesions or obstructive findings. The appendix is normal.  Vascular/Lymphatic: Moderate to advanced atherosclerotic calcifications involving the aorta and branch vessels but no aneurysm. No mesenteric or retroperitoneal mass or adenopathy.  Reproductive: Surgically absent.  Other: No pelvic mass or adenopathy. No free pelvic fluid collections. No  inguinal mass or adenopathy. No abdominal wall hernia or subcutaneous lesions.  Musculoskeletal: No significant bony findings. Moderate degenerative changes involving the lumbar spine.  IMPRESSION: 1. No acute abdominal/pelvic findings, mass lesions or adenopathy. 2. No renal, ureteral or bladder calculi or mass. 3. Stable left adrenal gland adenoma. 4. Aortic atherosclerosis.  Aortic Atherosclerosis (ICD10-I70.0).   Electronically Signed By: Rudie Meyer M.D. On: 06/25/2020 06:19   Assessment & Plan:    1. Incomplete bladder emptying -continue bethanecol 10mg  BID  2. Urinary tract infection without hematuria, site unspecified Continue flomax 0.4mg  daily   No follow-ups on file.  Wilkie Aye, MD  [facility] [place]

## 2023-11-12 ENCOUNTER — Inpatient Hospital Stay (HOSPITAL_COMMUNITY): Payer: Medicare Other

## 2023-11-12 ENCOUNTER — Emergency Department (HOSPITAL_COMMUNITY): Payer: Medicare Other

## 2023-11-12 ENCOUNTER — Inpatient Hospital Stay (HOSPITAL_COMMUNITY)
Admission: EM | Admit: 2023-11-12 | Discharge: 2023-11-17 | DRG: 208 | Disposition: A | Discharge status: Home / Self Care | Payer: Medicare Other | Attending: Internal Medicine | Admitting: Internal Medicine

## 2023-11-12 DIAGNOSIS — Z9981 Dependence on supplemental oxygen: Secondary | ICD-10-CM

## 2023-11-12 DIAGNOSIS — F419 Anxiety disorder, unspecified: Secondary | ICD-10-CM | POA: Diagnosis present

## 2023-11-12 DIAGNOSIS — R410 Disorientation, unspecified: Secondary | ICD-10-CM | POA: Diagnosis not present

## 2023-11-12 DIAGNOSIS — Z821 Family history of blindness and visual loss: Secondary | ICD-10-CM | POA: Diagnosis not present

## 2023-11-12 DIAGNOSIS — Z7982 Long term (current) use of aspirin: Secondary | ICD-10-CM

## 2023-11-12 DIAGNOSIS — I110 Hypertensive heart disease with heart failure: Secondary | ICD-10-CM | POA: Diagnosis present

## 2023-11-12 DIAGNOSIS — J9621 Acute and chronic respiratory failure with hypoxia: Secondary | ICD-10-CM | POA: Diagnosis not present

## 2023-11-12 DIAGNOSIS — J9622 Acute and chronic respiratory failure with hypercapnia: Secondary | ICD-10-CM | POA: Diagnosis present

## 2023-11-12 DIAGNOSIS — Z4682 Encounter for fitting and adjustment of non-vascular catheter: Secondary | ICD-10-CM | POA: Diagnosis not present

## 2023-11-12 DIAGNOSIS — M549 Dorsalgia, unspecified: Secondary | ICD-10-CM | POA: Diagnosis present

## 2023-11-12 DIAGNOSIS — R0989 Other specified symptoms and signs involving the circulatory and respiratory systems: Secondary | ICD-10-CM | POA: Diagnosis not present

## 2023-11-12 DIAGNOSIS — Z7951 Long term (current) use of inhaled steroids: Secondary | ICD-10-CM

## 2023-11-12 DIAGNOSIS — R6889 Other general symptoms and signs: Secondary | ICD-10-CM | POA: Diagnosis not present

## 2023-11-12 DIAGNOSIS — R404 Transient alteration of awareness: Secondary | ICD-10-CM | POA: Diagnosis not present

## 2023-11-12 DIAGNOSIS — J984 Other disorders of lung: Secondary | ICD-10-CM | POA: Diagnosis not present

## 2023-11-12 DIAGNOSIS — G8929 Other chronic pain: Secondary | ICD-10-CM | POA: Diagnosis not present

## 2023-11-12 DIAGNOSIS — E669 Obesity, unspecified: Secondary | ICD-10-CM | POA: Diagnosis present

## 2023-11-12 DIAGNOSIS — Z90710 Acquired absence of both cervix and uterus: Secondary | ICD-10-CM

## 2023-11-12 DIAGNOSIS — Z860100 Personal history of colon polyps, unspecified: Secondary | ICD-10-CM

## 2023-11-12 DIAGNOSIS — J9620 Acute and chronic respiratory failure, unspecified whether with hypoxia or hypercapnia: Secondary | ICD-10-CM | POA: Diagnosis not present

## 2023-11-12 DIAGNOSIS — Z6833 Body mass index (BMI) 33.0-33.9, adult: Secondary | ICD-10-CM | POA: Diagnosis not present

## 2023-11-12 DIAGNOSIS — Z87440 Personal history of urinary (tract) infections: Secondary | ICD-10-CM

## 2023-11-12 DIAGNOSIS — E876 Hypokalemia: Secondary | ICD-10-CM | POA: Diagnosis not present

## 2023-11-12 DIAGNOSIS — F32A Depression, unspecified: Secondary | ICD-10-CM | POA: Diagnosis present

## 2023-11-12 DIAGNOSIS — J189 Pneumonia, unspecified organism: Secondary | ICD-10-CM | POA: Diagnosis not present

## 2023-11-12 DIAGNOSIS — R918 Other nonspecific abnormal finding of lung field: Secondary | ICD-10-CM | POA: Diagnosis not present

## 2023-11-12 DIAGNOSIS — J441 Chronic obstructive pulmonary disease with (acute) exacerbation: Secondary | ICD-10-CM | POA: Diagnosis not present

## 2023-11-12 DIAGNOSIS — Z87891 Personal history of nicotine dependence: Secondary | ICD-10-CM

## 2023-11-12 DIAGNOSIS — Z79899 Other long term (current) drug therapy: Secondary | ICD-10-CM

## 2023-11-12 DIAGNOSIS — Z8249 Family history of ischemic heart disease and other diseases of the circulatory system: Secondary | ICD-10-CM | POA: Diagnosis not present

## 2023-11-12 DIAGNOSIS — Z888 Allergy status to other drugs, medicaments and biological substances status: Secondary | ICD-10-CM

## 2023-11-12 DIAGNOSIS — R4182 Altered mental status, unspecified: Secondary | ICD-10-CM | POA: Diagnosis present

## 2023-11-12 DIAGNOSIS — R0603 Acute respiratory distress: Secondary | ICD-10-CM | POA: Diagnosis not present

## 2023-11-12 DIAGNOSIS — E873 Alkalosis: Secondary | ICD-10-CM | POA: Diagnosis not present

## 2023-11-12 DIAGNOSIS — Z8701 Personal history of pneumonia (recurrent): Secondary | ICD-10-CM

## 2023-11-12 DIAGNOSIS — J90 Pleural effusion, not elsewhere classified: Secondary | ICD-10-CM | POA: Diagnosis not present

## 2023-11-12 DIAGNOSIS — R0602 Shortness of breath: Secondary | ICD-10-CM | POA: Diagnosis not present

## 2023-11-12 DIAGNOSIS — J929 Pleural plaque without asbestos: Secondary | ICD-10-CM | POA: Diagnosis not present

## 2023-11-12 DIAGNOSIS — I2510 Atherosclerotic heart disease of native coronary artery without angina pectoris: Secondary | ICD-10-CM | POA: Diagnosis not present

## 2023-11-12 DIAGNOSIS — E878 Other disorders of electrolyte and fluid balance, not elsewhere classified: Secondary | ICD-10-CM | POA: Diagnosis not present

## 2023-11-12 DIAGNOSIS — G9341 Metabolic encephalopathy: Secondary | ICD-10-CM | POA: Diagnosis present

## 2023-11-12 DIAGNOSIS — I5032 Chronic diastolic (congestive) heart failure: Secondary | ICD-10-CM | POA: Diagnosis not present

## 2023-11-12 LAB — GLUCOSE, CAPILLARY
Glucose-Capillary: 182 mg/dL — ABNORMAL HIGH (ref 70–99)
Glucose-Capillary: 149 mg/dL — ABNORMAL HIGH (ref 70–99)

## 2023-11-12 LAB — CBC
HCT: 34.2 % — ABNORMAL LOW (ref 36.0–46.0)
Hemoglobin: 9.6 g/dL — ABNORMAL LOW (ref 12.0–15.0)
MCH: 28.8 pg (ref 26.0–34.0)
MCHC: 28.1 g/dL — ABNORMAL LOW (ref 30.0–36.0)
MCV: 102.7 fL — ABNORMAL HIGH (ref 80.0–100.0)
Platelets: 149 10*3/uL — ABNORMAL LOW (ref 150–400)
RBC: 3.33 MIL/uL — ABNORMAL LOW (ref 3.87–5.11)
RDW: 12.7 % (ref 11.5–15.5)
WBC: 9.9 10*3/uL (ref 4.0–10.5)
nRBC: 0.3 % — ABNORMAL HIGH (ref 0.0–0.2)

## 2023-11-12 LAB — CBC WITH DIFFERENTIAL/PLATELET
Abs Immature Granulocytes: 0.44 10*3/uL — ABNORMAL HIGH (ref 0.00–0.07)
Basophils Absolute: 0 10*3/uL (ref 0.0–0.1)
Basophils Relative: 0 %
Eosinophils Absolute: 0 10*3/uL (ref 0.0–0.5)
Eosinophils Relative: 0 %
HCT: 37.1 % (ref 36.0–46.0)
Hemoglobin: 10 g/dL — ABNORMAL LOW (ref 12.0–15.0)
Immature Granulocytes: 4 %
Lymphocytes Relative: 7 %
Lymphs Abs: 0.8 10*3/uL (ref 0.7–4.0)
MCH: 28.8 pg (ref 26.0–34.0)
MCHC: 27 g/dL — ABNORMAL LOW (ref 30.0–36.0)
MCV: 106.9 fL — ABNORMAL HIGH (ref 80.0–100.0)
Monocytes Absolute: 1 10*3/uL (ref 0.1–1.0)
Monocytes Relative: 10 %
Neutro Abs: 8.3 10*3/uL — ABNORMAL HIGH (ref 1.7–7.7)
Neutrophils Relative %: 79 %
Platelets: 156 10*3/uL (ref 150–400)
RBC: 3.47 MIL/uL — ABNORMAL LOW (ref 3.87–5.11)
RDW: 13 % (ref 11.5–15.5)
WBC: 10.7 10*3/uL — ABNORMAL HIGH (ref 4.0–10.5)
nRBC: 0.2 % (ref 0.0–0.2)

## 2023-11-12 LAB — RESPIRATORY PANEL BY PCR

## 2023-11-12 LAB — COMPREHENSIVE METABOLIC PANEL
ALT: 14 U/L (ref 0–44)
AST: 15 U/L (ref 15–41)
Albumin: 3.6 g/dL (ref 3.5–5.0)
Alkaline Phosphatase: 67 U/L (ref 38–126)
BUN: 15 mg/dL (ref 8–23)
CO2: >45 mmol/L — ABNORMAL HIGH (ref 22–32)
Calcium: 8.7 mg/dL — ABNORMAL LOW (ref 8.9–10.3)
Chloride: 90 mmol/L — ABNORMAL LOW (ref 98–111)
Creatinine, Ser: 0.52 mg/dL (ref 0.44–1.00)
GFR, Estimated: >60 mL/min (ref >60)
Glucose, Bld: 134 mg/dL — ABNORMAL HIGH (ref 70–99)
Potassium: 4.8 mmol/L (ref 3.5–5.1)
Sodium: 141 mmol/L (ref 135–145)
Total Bilirubin: 0.5 mg/dL (ref <1.2)
Total Protein: 6.3 g/dL — ABNORMAL LOW (ref 6.5–8.1)

## 2023-11-12 LAB — POCT I-STAT 7, (LYTES, BLD GAS, ICA,H+H)
Acid-Base Excess: 20 mmol/L — ABNORMAL HIGH (ref 0.0–2.0)
Bicarbonate: 48.1 mmol/L — ABNORMAL HIGH (ref 20.0–28.0)
Calcium, Ion: 1.13 mmol/L — ABNORMAL LOW (ref 1.15–1.40)
HCT: 38 % (ref 36.0–46.0)
Hemoglobin: 12.9 g/dL (ref 12.0–15.0)
O2 Saturation: 99 %
Potassium: 4.1 mmol/L (ref 3.5–5.1)
Sodium: 136 mmol/L (ref 135–145)
TCO2: >50 mmol/L — ABNORMAL HIGH (ref 22–32)
pCO2 arterial: 69.8 mm[Hg] (ref 32–48)
pH, Arterial: 7.446 (ref 7.35–7.45)
pO2, Arterial: 150 mm[Hg] — ABNORMAL HIGH (ref 83–108)

## 2023-11-12 LAB — MRSA NEXT GEN BY PCR, NASAL: MRSA by PCR Next Gen: NOT DETECTED

## 2023-11-12 LAB — RESP PANEL BY RT-PCR (RSV, FLU A&B, COVID)  RVPGX2
Influenza A by PCR: NEGATIVE
Influenza B by PCR: NEGATIVE
Resp Syncytial Virus by PCR: NEGATIVE
SARS Coronavirus 2 by RT PCR: NEGATIVE

## 2023-11-12 LAB — CREATININE, SERUM
Creatinine, Ser: 0.71 mg/dL (ref 0.44–1.00)
GFR, Estimated: >60 mL/min (ref >60)

## 2023-11-12 LAB — PROCALCITONIN: Procalcitonin: <0.1 ng/mL

## 2023-11-12 LAB — BRAIN NATRIURETIC PEPTIDE: B Natriuretic Peptide: 487.6 pg/mL — ABNORMAL HIGH (ref 0.0–100.0)

## 2023-11-12 LAB — STREP PNEUMONIAE URINARY ANTIGEN: Strep Pneumo Urinary Antigen: NEGATIVE

## 2023-11-12 LAB — HEMOGLOBIN A1C
Hgb A1c MFr Bld: 5 % (ref 4.8–5.6)
Mean Plasma Glucose: 96.8 mg/dL

## 2023-11-12 MED ORDER — ATORVASTATIN CALCIUM 40 MG PO TABS
80.0000 mg | ORAL_TABLET | Freq: Every day | ORAL | Status: DC
  Administered 2023-11-12 – 2023-11-13 (×2): 80 mg via ORAL
  Filled 2023-11-12 (×2): qty 2

## 2023-11-12 MED ORDER — FUROSEMIDE 10 MG/ML IJ SOLN
40.0000 mg | Freq: Once | INTRAMUSCULAR | Status: AC
  Administered 2023-11-12: 40 mg via INTRAVENOUS
  Filled 2023-11-12: qty 4

## 2023-11-12 MED ORDER — AMLODIPINE BESYLATE 5 MG PO TABS
2.5000 mg | ORAL_TABLET | Freq: Once | ORAL | Status: AC | PRN
  Administered 2023-11-12: 2.5 mg via NASOGASTRIC
  Filled 2023-11-12: qty 1

## 2023-11-12 MED ORDER — BETHANECHOL CHLORIDE 10 MG PO TABS
20.0000 mg | ORAL_TABLET | Freq: Every day | ORAL | Status: DC
  Administered 2023-11-13 – 2023-11-14 (×2): 20 mg
  Filled 2023-11-12 (×2): qty 2

## 2023-11-12 MED ORDER — SODIUM CHLORIDE 0.9% FLUSH: 3.0000 mL | INTRAVENOUS | Status: DC | PRN

## 2023-11-12 MED ORDER — FENTANYL CITRATE PF 50 MCG/ML IJ SOSY: 25.0000 ug | PREFILLED_SYRINGE | INTRAMUSCULAR | Status: DC | PRN

## 2023-11-12 MED ORDER — FENTANYL BOLUS VIA INFUSION
25.0000 ug | INTRAVENOUS | Status: DC | PRN
  Administered 2023-11-12 (×2): 75 ug via INTRAVENOUS
  Administered 2023-11-12: 50 ug via INTRAVENOUS
  Administered 2023-11-12: 100 ug via INTRAVENOUS
  Administered 2023-11-12: 25 ug via INTRAVENOUS
  Administered 2023-11-12 – 2023-11-13 (×6): 100 ug via INTRAVENOUS
  Administered 2023-11-13: 50 ug via INTRAVENOUS
  Administered 2023-11-14: 100 ug via INTRAVENOUS

## 2023-11-12 MED ORDER — FAMOTIDINE 20 MG PO TABS: 20.0000 mg | ORAL_TABLET | Freq: Two times a day (BID) | ORAL | Status: DC

## 2023-11-12 MED ORDER — FENTANYL 2500MCG IN NS 250ML (10MCG/ML) PREMIX INFUSION
25.0000 ug/h | INTRAVENOUS | Status: DC
  Administered 2023-11-12: 25 ug/h via INTRAVENOUS
  Administered 2023-11-13 – 2023-11-14 (×2): 125 ug/h via INTRAVENOUS
  Filled 2023-11-12 (×3): qty 250

## 2023-11-12 MED ORDER — ROCURONIUM BROMIDE 10 MG/ML (PF) SYRINGE
PREFILLED_SYRINGE | INTRAVENOUS | Status: AC | PRN
  Administered 2023-11-12: 90 mg via INTRAVENOUS

## 2023-11-12 MED ORDER — METHYLPREDNISOLONE SODIUM SUCC 40 MG IJ SOLR
40.0000 mg | Freq: Two times a day (BID) | INTRAMUSCULAR | Status: DC
  Administered 2023-11-12 – 2023-11-13 (×2): 40 mg via INTRAVENOUS
  Filled 2023-11-12 (×2): qty 1

## 2023-11-12 MED ORDER — AMLODIPINE BESYLATE 5 MG PO TABS
2.5000 mg | ORAL_TABLET | Freq: Once | ORAL | Status: DC | PRN
Start: 2023-11-12 — End: 2023-11-12

## 2023-11-12 MED ORDER — IPRATROPIUM-ALBUTEROL 0.5-2.5 (3) MG/3ML IN SOLN: 3.0000 mL | Freq: Four times a day (QID) | RESPIRATORY_TRACT | Status: DC

## 2023-11-12 MED ORDER — ETOMIDATE 2 MG/ML IV SOLN: 20.0000 mg | Freq: Once | INTRAVENOUS | Status: DC

## 2023-11-12 MED ORDER — PROPOFOL 1000 MG/100ML IV EMUL
0.0000 ug/kg/min | INTRAVENOUS | Status: DC
  Administered 2023-11-12: 5 ug/kg/min via INTRAVENOUS
  Administered 2023-11-12: 30 ug/kg/min via INTRAVENOUS
  Administered 2023-11-13: 45 ug/kg/min via INTRAVENOUS
  Administered 2023-11-13 – 2023-11-14 (×7): 50 ug/kg/min via INTRAVENOUS
  Administered 2023-11-14: 40 ug/kg/min via INTRAVENOUS
  Filled 2023-11-12: qty 200
  Filled 2023-11-12 (×6): qty 100
  Filled 2023-11-12: qty 200

## 2023-11-12 MED ORDER — DEXTROSE 5 % IV SOLN
500.0000 mg | INTRAVENOUS | Status: DC
  Administered 2023-11-13: 500 mg via INTRAVENOUS
  Filled 2023-11-12 (×2): qty 5

## 2023-11-12 MED ORDER — TAMSULOSIN HCL 0.4 MG PO CAPS: 0.4000 mg | ORAL_CAPSULE | Freq: Every day | ORAL | Status: DC

## 2023-11-12 MED ORDER — METHYLPREDNISOLONE SODIUM SUCC 125 MG IJ SOLR
125.0000 mg | Freq: Once | INTRAMUSCULAR | Status: AC
Start: 2023-11-12 — End: 2023-11-12
  Administered 2023-11-12: 125 mg via INTRAVENOUS
  Filled 2023-11-12: qty 2

## 2023-11-12 MED ORDER — HEPARIN SODIUM (PORCINE) 5000 UNIT/ML IJ SOLN
5000.0000 [IU] | Freq: Three times a day (TID) | INTRAMUSCULAR | Status: DC
  Administered 2023-11-12 – 2023-11-17 (×16): 5000 [IU] via SUBCUTANEOUS
  Filled 2023-11-12 (×16): qty 1

## 2023-11-12 MED ORDER — HYDRALAZINE HCL 20 MG/ML IJ SOLN
5.0000 mg | Freq: Three times a day (TID) | INTRAMUSCULAR | Status: AC | PRN
  Administered 2023-11-14 – 2023-11-15 (×3): 5 mg via INTRAVENOUS
  Filled 2023-11-12 (×3): qty 1

## 2023-11-12 MED ORDER — ALBUTEROL SULFATE (2.5 MG/3ML) 0.083% IN NEBU
10.0000 mg/h | INHALATION_SOLUTION | Freq: Once | RESPIRATORY_TRACT | Status: AC
  Administered 2023-11-12: 10 mg/h via RESPIRATORY_TRACT
  Filled 2023-11-12: qty 12

## 2023-11-12 MED ORDER — CHLORHEXIDINE GLUCONATE CLOTH 2 % EX PADS: 6.0000 | MEDICATED_PAD | Freq: Every day | CUTANEOUS | Status: DC

## 2023-11-12 MED ORDER — ETOMIDATE 2 MG/ML IV SOLN
INTRAVENOUS | Status: AC | PRN
  Administered 2023-11-12: 20 mg via INTRAVENOUS

## 2023-11-12 MED ORDER — IPRATROPIUM-ALBUTEROL 0.5-2.5 (3) MG/3ML IN SOLN
3.0000 mL | RESPIRATORY_TRACT | Status: DC
Start: 2023-11-12 — End: 2023-11-13
  Administered 2023-11-12 – 2023-11-13 (×8): 3 mL via RESPIRATORY_TRACT
  Filled 2023-11-12 (×8): qty 3

## 2023-11-12 MED ORDER — SODIUM CHLORIDE 0.9 % IV SOLN
2.0000 g | INTRAVENOUS | Status: DC
  Administered 2023-11-12 – 2023-11-13 (×2): 2 g via INTRAVENOUS
  Filled 2023-11-12 (×2): qty 20

## 2023-11-12 MED ORDER — DOCUSATE SODIUM 50 MG/5ML PO LIQD
100.0000 mg | Freq: Two times a day (BID) | ORAL | Status: DC
  Administered 2023-11-12 – 2023-11-13 (×3): 100 mg
  Filled 2023-11-12 (×3): qty 10

## 2023-11-12 MED ORDER — ORAL CARE MOUTH RINSE
15.0000 mL | OROMUCOSAL | Status: DC
  Administered 2023-11-12 – 2023-11-14 (×23): 15 mL via OROMUCOSAL

## 2023-11-12 MED ORDER — POLYETHYLENE GLYCOL 3350 17 G PO PACK
17.0000 g | PACK | Freq: Every day | ORAL | Status: DC
  Filled 2023-11-12: qty 1

## 2023-11-12 MED ORDER — ORAL CARE MOUTH RINSE: 15.0000 mL | OROMUCOSAL | Status: DC | PRN

## 2023-11-12 MED ORDER — ESCITALOPRAM OXALATE 10 MG PO TABS: 5.0000 mg | ORAL_TABLET | Freq: Every day | ORAL | Status: DC

## 2023-11-12 MED ORDER — LORAZEPAM 2 MG/ML IJ SOLN
0.5000 mg | Freq: Once | INTRAMUSCULAR | Status: DC
  Filled 2023-11-12: qty 1

## 2023-11-12 MED ORDER — DEXTROSE 5 % IV SOLN
500.0000 mg | Freq: Once | INTRAVENOUS | Status: AC
  Administered 2023-11-12: 500 mg via INTRAVENOUS
  Filled 2023-11-12: qty 5

## 2023-11-12 MED ORDER — DOCUSATE SODIUM 100 MG PO CAPS
100.0000 mg | ORAL_CAPSULE | Freq: Two times a day (BID) | ORAL | Status: DC | PRN
Start: 2023-11-12 — End: 2023-11-14

## 2023-11-12 MED ORDER — PANTOPRAZOLE SODIUM 40 MG IV SOLR
40.0000 mg | Freq: Two times a day (BID) | INTRAVENOUS | Status: DC
  Administered 2023-11-12 – 2023-11-15 (×6): 40 mg via INTRAVENOUS
  Filled 2023-11-12 (×6): qty 10

## 2023-11-12 MED ORDER — BETHANECHOL CHLORIDE 10 MG PO TABS: 20.0000 mg | ORAL_TABLET | Freq: Every day | ORAL | Status: DC

## 2023-11-12 MED ORDER — BUDESONIDE 0.25 MG/2ML IN SUSP
0.2500 mg | Freq: Two times a day (BID) | RESPIRATORY_TRACT | Status: DC
  Administered 2023-11-12 (×2): 0.25 mg via RESPIRATORY_TRACT
  Filled 2023-11-12 (×2): qty 2

## 2023-11-12 MED ORDER — POLYETHYLENE GLYCOL 3350 17 G PO PACK
17.0000 g | PACK | Freq: Every day | ORAL | Status: DC | PRN
  Administered 2023-11-12: 17 g via ORAL

## 2023-11-12 MED ORDER — ROCURONIUM BROMIDE 10 MG/ML (PF) SYRINGE: 100.0000 mg | PREFILLED_SYRINGE | Freq: Once | INTRAVENOUS | Status: DC

## 2023-11-12 MED ORDER — PANTOPRAZOLE SODIUM 20 MG PO TBEC
20.0000 mg | DELAYED_RELEASE_TABLET | Freq: Every day | ORAL | Status: DC
  Filled 2023-11-12: qty 1

## 2023-11-12 MED ORDER — INSULIN ASPART 100 UNIT/ML IJ SOLN
0.0000 [IU] | INTRAMUSCULAR | Status: DC
  Administered 2023-11-12: 2 [IU] via SUBCUTANEOUS
  Administered 2023-11-12 – 2023-11-13 (×3): 3 [IU] via SUBCUTANEOUS
  Administered 2023-11-13 (×3): 2 [IU] via SUBCUTANEOUS
  Administered 2023-11-13: 3 [IU] via SUBCUTANEOUS
  Administered 2023-11-14 – 2023-11-16 (×7): 2 [IU] via SUBCUTANEOUS
  Administered 2023-11-16: 3 [IU] via SUBCUTANEOUS
  Administered 2023-11-16: 2 [IU] via SUBCUTANEOUS
  Administered 2023-11-17: 0 [IU] via SUBCUTANEOUS
  Administered 2023-11-17: 2 [IU] via SUBCUTANEOUS

## 2023-11-12 MED ORDER — BUDESONIDE 0.25 MG/2ML IN SUSP
0.2500 mg | Freq: Two times a day (BID) | RESPIRATORY_TRACT | Status: DC
Start: 2023-11-13 — End: 2023-11-17
  Administered 2023-11-13 – 2023-11-17 (×9): 0.25 mg via RESPIRATORY_TRACT
  Filled 2023-11-12 (×9): qty 2

## 2023-11-12 MED ORDER — ESCITALOPRAM OXALATE 10 MG PO TABS
5.0000 mg | ORAL_TABLET | Freq: Every day | ORAL | Status: DC
  Administered 2023-11-13 – 2023-11-14 (×2): 5 mg
  Filled 2023-11-12 (×2): qty 1

## 2023-11-12 MED ORDER — SODIUM CHLORIDE 0.9 % IV SOLN
250.0000 mL | INTRAVENOUS | Status: AC | PRN
Start: 2023-11-12 — End: 2023-11-13

## 2023-11-12 MED ORDER — SODIUM CHLORIDE 0.9% FLUSH
3.0000 mL | Freq: Two times a day (BID) | INTRAVENOUS | Status: DC
  Administered 2023-11-12 – 2023-11-15 (×7): 3 mL via INTRAVENOUS

## 2023-11-12 NOTE — ED Notes (Signed)
Pt belongings placed in bag with label. Pt necklace in sample cup labeled and place in bag.

## 2023-11-12 NOTE — Progress Notes (Addendum)
MD made aware of ABG results  7.11 CO2 >110 PO2 253

## 2023-11-12 NOTE — ED Notes (Signed)
RT and RN attempting to redirect pt from pulling of her mask. Pt says she can't breathe and pulling at cords. RN notified EDP

## 2023-11-12 NOTE — Plan of Care (Signed)
  Problem: Education: Goal: Ability to describe self-care measures that may prevent or decrease complications (Diabetes Survival Skills Education) will improve Outcome: Not Progressing Goal: Individualized Educational Video(s) Outcome: Not Applicable   Problem: Coping: Goal: Ability to adjust to condition or change in health will improve Outcome: Progressing   Problem: Fluid Volume: Goal: Ability to maintain a balanced intake and output will improve Outcome: Progressing   Problem: Health Behavior/Discharge Planning: Goal: Ability to identify and utilize available resources and services will improve Outcome: Progressing Goal: Ability to manage health-related needs will improve Outcome: Progressing   Problem: Metabolic: Goal: Ability to maintain appropriate glucose levels will improve Outcome: Progressing   Problem: Nutritional: Goal: Maintenance of adequate nutrition will improve Outcome: Progressing Goal: Progress toward achieving an optimal weight will improve Outcome: Progressing   Problem: Skin Integrity: Goal: Risk for impaired skin integrity will decrease Outcome: Progressing   Problem: Tissue Perfusion: Goal: Adequacy of tissue perfusion will improve Outcome: Progressing   Problem: Education: Goal: Knowledge of General Education information will improve Description: Including pain rating scale, medication(s)/side effects and non-pharmacologic comfort measures Outcome: Not Progressing   Problem: Health Behavior/Discharge Planning: Goal: Ability to manage health-related needs will improve Outcome: Progressing   Problem: Clinical Measurements: Goal: Ability to maintain clinical measurements within normal limits will improve Outcome: Progressing Goal: Will remain free from infection Outcome: Progressing Goal: Diagnostic test results will improve Outcome: Progressing Goal: Respiratory complications will improve Outcome: Progressing Goal: Cardiovascular  complication will be avoided Outcome: Progressing

## 2023-11-12 NOTE — ED Notes (Signed)
RN entered room and pt was smiling. Pt says she is at the hospital and her skin look more pink.

## 2023-11-12 NOTE — ED Notes (Signed)
RT at bedside placing pt on CPAP

## 2023-11-12 NOTE — ED Notes (Signed)
Rn attempted to call nephew no answer

## 2023-11-12 NOTE — ED Triage Notes (Signed)
Pt bib ems from home c/o AMS. Family noted the past day she hasn't been acting normal and she had a bluish tint to skin. Pt was on her home O2 3L 70% Pt responded to sternum rub. Pt placed on non-rebreather 96%. Pin point eyes and pt given 1 mg Narcan d/t Vicodin. Hx COPD CHF    BP 152/64 HR 60 CBG 156

## 2023-11-12 NOTE — H&P (Addendum)
NAME:  [name], MRN:  [id number], DOB:  1951/12/16, LOS: 0 ADMISSION DATE:  11/12/2023, CONSULTATION DATE: 11/17 REFERRING MD: Jeraldine Loots, CHIEF COMPLAINT: Acute on chronic hypoxic and hypercarbic respiratory failure  History of Present Illness:  72 year old female patient followed by Dr. Sherene Sires for chronic respiratory failure in the setting of Gold 4 COPD typically maintained on 2 L nasal cannula With baseline exertional dyspnea noted last seen as a hospital follow-up on August 14, 2023 following hospitalization for sepsis from urinary tract infection, complicated further by exacerbation of her underlying lung disease, just discharged once again on 10/22 after being hospitalized for acute hypercarbic respiratory failure, encephalopathy, due to decompensated diastolic heart failure +/- pneumonia treated with antibiotics, nebulized therapy, and systemic steroids and improved. On 11/12 patient called our office reporting 2 to 3-day history of worsening shortness of breath denied cough.  Did endorse shortness of breath with minimal exertion.  Pulse oximetry at rest 78% while on telephone and oxygen titrated to 3.5 L.  It was recommended that she be seen in the emergency room, the patient declined as per last ER visit resulted in several hours in the waiting room.  On 11/17 family called EMS as the patient was found to be less responsive, would only respond to sternal rub pulse oximetry 70% on 3 L.  Minimally arousable on presentation to the ER.  Placed on BiPAP.  Started on IV antibiotics and systemic steroids.  Initial blood gas notable for pH 7.18 pCO2 greater than 110.  Following placement of BiPAP patient did become a little more interactive and confused, pulling at BiPAP mask for this she also received small dose of Ativan.  Because of her acute on chronic respiratory failure pulmonary asked to admit.  Pertinent  Medical History  Chronic respiratory failure on home oxygen 2 L, Gold 4 COPD diastolic  heart failure, hypertension , Chronic back pain, urinary retention, prior pyelonephritis and sepsis, normocytic anemia, left lower lobe pulmonary nodule, obesity  Significant Hospital Events: Including procedures, antibiotic start and stop dates in addition to other pertinent events   11/17 admitted with acute on chronic hypoxic and hypercarbic respiratory failure and associated  hypercarbic encephalopathy started on broad-spectrum antibiotics, systemic steroids, and scheduled bronchodilators in addition to noninvasive positive pressure ventilation  Interim History / Subjective:  She is currently minimally arousable with minimal air movement  Objective   Blood pressure (!) 158/77, pulse 72, temperature (!) 96.5 F (35.8 C), temperature source Temporal, resp. rate (!) 23, SpO2 100%.    FiO2 (%):  [40 %] 40 %  No intake or output data in the 24 hours ending 11/12/23 1350 There were no vitals filed for this visit.  Examination: General: Chronically ill-appearing 72 year old female currently on NIPPV HENT: Normocephalic atraumatic BiPAP mask in place there is a little bit of an air leak Lungs: Scattered rhonchi low tidal volumes marked accessory use Portable chest x-ray personally reviewed shows slightly rotated film, bibasilar opacities,  Cardiovascular: RRR no MRG Abdomen: soft  Extremities: trace LE edema Neuro: Mumbles incomprehensible words, will localize, very somnolent GU: Due to void  Resolved Hospital Problem list     Assessment & Plan:  Acute on chronic hypoxic and hypercarbic respiratory failure secondary to acute exacerbation of chronic obstructive pulmonary disease, +/- CAP -Has a history of chronic respiratory failure, recently hospitalized for pneumonia and decompensated diastolic dysfunction.  Was intolerant of NIPPV during hospitalization Plan Continuing NIPPV for now, follow-up arterial blood gas.  Given mental status I am  concerned she will require intubation Send  urinary strep and Legionella antigen as well as RVP Scheduled systemic steroids Scheduled bronchodilators and inhaled corticosteroids IV ceftriaxone and azithromycin Continuous pulse ox Will reach out to family, seems like a poor candidate for intubation however little else to offer if she does not wake up Consider echo  Acute metabolic/hypercarbic encephalopathy  Plan Holding sedating medications Support respiratory failure Avoiding fever Keep euglycemic  History of diastolic heart failure and hypertension Plan Received 1 dose of Lasix in the emergency room, reasonable to consider volume overload Continue telemetry monitoring And continue aspirin, atorvastatin, and Lopressor  History of urinary retention and recurrent UTIs Plan Continue Urecholine and Flomax  History of anxiety and depression Plan Holding alprazolam for now given altered state, continue Lexapro   chronic back pain Plan Treat as needed  Best Practice (right click and "Reselect all SmartList Selections" daily)   Diet/type: NPO w/ oral meds DVT prophylaxis: prophylactic heparin  GI prophylaxis: PPI Lines: N/A Foley:  N/A Code Status:  full code Last date of multidisciplinary goals of care discussion [ pending]  Labs   CBC: Recent Labs  Lab 11/12/23 1215  WBC 10.7*  NEUTROABS 8.3*  HGB 10.0*  HCT 37.1  MCV 106.9*  PLT 156    Basic Metabolic Panel: Recent Labs  Lab 11/12/23 1215  NA 141  K 4.8  CL 90*  CO2 >45*  GLUCOSE 134*  BUN 15  CREATININE 0.52  CALCIUM 8.7*   GFR: CrCl cannot be calculated (Unknown ideal weight.). Recent Labs  Lab 11/12/23 1215  WBC 10.7*    Liver Function Tests: Recent Labs  Lab 11/12/23 1215  AST 15  ALT 14  ALKPHOS 67  BILITOT 0.5  PROT 6.3*  ALBUMIN 3.6   No results for input(s): "LIPASE", "AMYLASE" in the last 168 hours. No results for input(s): "AMMONIA" in the last 168 hours.  ABG    Component Value Date/Time   PHART 7.254 (L)  10/11/2023 0538   PCO2ART 101.9 (HH) 10/11/2023 0538   PO2ART 77 (L) 10/11/2023 0538   HCO3 48.3 (H) 10/17/2023 0544   TCO2 48 (H) 10/11/2023 0538   O2SAT 97.1 10/17/2023 0544     Coagulation Profile: No results for input(s): "INR", "PROTIME" in the last 168 hours.  Cardiac Enzymes: No results for input(s): "CKTOTAL", "CKMB", "CKMBINDEX", "TROPONINI" in the last 168 hours.  HbA1C: Hgb A1c MFr Bld  Date/Time Value Ref Range Status  07/01/2022 02:07 AM 5.4 4.8 - 5.6 % Final    Comment:    (NOTE) Pre diabetes:          5.7%-6.4%  Diabetes:              >6.4%  Glycemic control for   <7.0% adults with diabetes     CBG: No results for input(s): "GLUCAP" in the last 168 hours.  Review of Systems:   Not able   Past Medical History:  She,  has a past medical history of Acute respiratory failure with hypoxia and hypercapnia (HCC) (07/19/2023), Anxiety, Atrophic vaginitis (10/09/2015), Baker's cyst, Blood in urine (10/09/2015), CHF (congestive heart failure) (HCC), COPD (chronic obstructive pulmonary disease) (HCC), Heart murmur, Hematuria (10/09/2015), Hypertension, Nicotine addiction (04/07/2014), Oxygen dependent, Pneumonia, Urinary retention (07/01/2020), Urinary urgency (01/25/2016), and Vaginal bleeding (10/09/2015).   Surgical History:   Past Surgical History:  Procedure Laterality Date   ABDOMINAL HYSTERECTOMY     CATARACT EXTRACTION Left 03/2015   CESAREAN SECTION     COLONOSCOPY  10/19/2004  Dr. Jena Gauss- internal hemorrhoids, o/w normal rectum, normal colon   COLONOSCOPY N/A 07/30/2014   [id number] diverticulosis. Colonic polyp-removed TA. repeat TCS 07/2021   COLONOSCOPY WITH PROPOFOL N/A 03/29/2021   diverticulosis in sigmoid and descending colon, one 5 mm polyp ascending colon (adenoma), non-bleeding internal hemorrhoids. 5 year surveillance   ESOPHAGOGASTRODUODENOSCOPY N/A 07/30/2014   [id number] EGD   POLYPECTOMY  03/29/2021   Procedure: POLYPECTOMY;  Surgeon: Corbin Ade, MD;  Location: [facility];  Service: Endoscopy;;   RADIAL HEAD ARTHROPLASTY Left 06/23/2017   Procedure: RADIAL HEAD ARTHROPLASTY WITH LIGAMENT REPAIR;  Surgeon: Cammy Copa, MD;  Location: [facility] OR;  Service: Orthopedics;  Laterality: Left;     Social History:   reports that she quit smoking about 10 years ago. Her smoking use included cigarettes. She started smoking about 50 years ago. She has been exposed to tobacco smoke. She has never used smokeless tobacco. She reports current alcohol use. She reports that she does not use drugs.   Family History:  Her family history includes Blindness in her maternal grandfather; Cancer in her daughter, sister, sister, sister, and sister; Hypertension in her sister; Migraines in her daughter; Other in her brother, brother, sister, and sister. There is no history of Colon cancer. She was adopted.   Allergies Allergies  Allergen Reactions   Neurontin [Gabapentin] Other (See Comments)    Lightheadedness  Groggy      Home Medications  Prior to Admission medications   Medication Sig Start Date End Date Taking? Authorizing Provider  albuterol (VENTOLIN HFA) 108 (90 Base) MCG/ACT inhaler Inhale 2 puffs into the lungs every 4 (four) hours as needed for wheezing or shortness of breath. For shortness of breath 08/14/23   Nyoka Cowden, MD  ALPRAZolam Prudy Feeler) 0.25 MG tablet Take 0.25 mg by mouth 2 (two) times daily.    [provider]  aspirin EC 81 MG tablet Take 81 mg by mouth daily.    [provider]  atorvastatin (LIPITOR) 80 MG tablet Take 1 tablet (80 mg total) by mouth daily. 07/07/22   Almon Hercules, MD  bethanechol (URECHOLINE) 10 MG tablet Take 2 tablets (20 mg total) by mouth daily. 11/10/23   McKenzie, Mardene Celeste, MD  budesonide (PULMICORT) 0.25 MG/2ML nebulizer solution Take 2 mLs (0.25 mg total) by nebulization 2 (two) times daily. 10/16/23 01/14/24  Carollee Herter, DO  escitalopram (LEXAPRO) 5 MG tablet Take 5 mg  by mouth daily.    [provider]  furosemide (LASIX) 20 MG tablet Take 1 tablet (20 mg total) by mouth daily. Patient taking differently: Take 20 mg by mouth daily as needed for fluid or edema. 07/08/22   Almon Hercules, MD  HYDROcodone-acetaminophen (NORCO) 10-325 MG tablet Take 1 tablet by mouth See admin instructions. 1 tablet every 3-4 hours as needed for pain. 07/14/22   [provider]  ipratropium-albuterol (DUONEB) 0.5-2.5 (3) MG/3ML SOLN Take 3 mLs by nebulization in the morning, at noon, in the evening, and at bedtime. 10/16/23 01/14/24  Carollee Herter, DO  metoprolol tartrate (LOPRESSOR) 25 MG tablet Take 1 tablet (25 mg total) by mouth 2 (two) times daily. 10/16/23 01/15/24  Carollee Herter, DO  Multiple Vitamins-Minerals (CENTRUM ADULT PO) Take 1 tablet by mouth daily.    [provider]  pantoprazole (PROTONIX) 20 MG tablet Take 1 tablet (20 mg total) by mouth daily. 07/08/22   Almon Hercules, MD  tamsulosin (FLOMAX) 0.4 MG CAPS capsule Take 1 capsule (0.4  mg total) by mouth daily. 11/10/23   Malen Gauze, MD     Critical care time: 34 min     Simonne Martinet ACNP-BC [facility] Pager # [phone] OR # [phone] if no answer

## 2023-11-12 NOTE — Code Documentation (Signed)
Pt intubated

## 2023-11-12 NOTE — Progress Notes (Addendum)
eLink Physician-Brief Progress Note Patient Name: [name] DOB: 12-02-1951 MRN: [id number]   Date of Service  11/12/2023  HPI/Events of Note  Pt current BP 163/60 (90) BSRN asking for desired parameters. If SBP < 160 is wanted, need PRN meds  Acute on chronic hypoxic, hypercarbic respiratory failure secondary to COPD exacerbation May have component of pneumonia, diastolic heart failure  On Ventilator. Not in pain. Has fenta for pain. No fever.  Cr normal.   eICU Interventions  Notes, labs reviewed Amlodipine low dose prn for SBP > 160 once for now via NG tube  Discussed with RN       Intervention Category Intermediate Interventions: Hypertension - evaluation and management  Ranee Gosselin 11/12/2023, 8:44 PM  23:46 patient's BP continues to sustain SBP >160, despite 2.5 norvasc given per tube over an hour ago" No PRNs  - ordered hydralazine prn.   01:16 Pt being difficult to sedate on Prop 50 (max) titrating Fent up @ 100 after total Fent bolus' 500. BP 185/124 (144). Takes Xanax 0.25 PTA  Versed prn IV ordered for now  05:19 AM ABG: worsening metabolic alkalosis, with hco3 > 50. BMI 33.pco2 improving ( type 2 failure, acute on chronic).  -diamox once ordered - avoid dehydration. Sherilyn Banker K/Mag > 4/2 respectively.

## 2023-11-12 NOTE — ED Notes (Signed)
Pt is  resting calmly in bed.

## 2023-11-12 NOTE — Progress Notes (Incomplete)
Attending note: I have seen and examined the patient. History, labs and imaging reviewed.  72 year old with history of severe COPD, chronic respiratory failure on home oxygen presenting with acute on chronic hypoxic, hypercarbic respiratory failure secondary to COPD exacerbation.   has a past medical history of Acute respiratory failure with hypoxia and hypercapnia (HCC) (07/19/2023), Anxiety, Atrophic vaginitis (10/09/2015), Baker's cyst, Blood in urine (10/09/2015), CHF (congestive heart failure) (HCC), COPD (chronic obstructive pulmonary disease) (HCC), Heart murmur, Hematuria (10/09/2015), Hypertension, Nicotine addiction (04/07/2014), Oxygen dependent, Pneumonia, Urinary retention (07/01/2020), Urinary urgency (01/25/2016), and Vaginal bleeding (10/09/2015).   Blood pressure (!) 158/77, pulse 72, temperature (!) 96.5 F (35.8 C), temperature source Temporal, resp. rate (!) 23, SpO2 100%. Gen:      No acute distress HEENT:  EOMI, sclera anicteric Neck:     No masses; no thyromegaly Lungs:    Clear to auscultation bilaterally; normal respiratory effort*** CV:         Regular rate and rhythm; no murmurs Abd:      + bowel sounds; soft, non-tender; no palpable masses, no distension Ext:    No edema; adequate peripheral perfusion Skin:      Warm and dry; no rash Neuro: alert and oriented x 3 Psych: normal mood and affect   Labs/Imaging personally reviewed, significant for BUN/creatinine 15/0.52 WBC 10.7, hemoglobin 10.0, platelets 156 Chest x-ray reviewed with mild vascular congestion, bibasilar opacities  Assessment/plan:  The patient is critically ill with multiple organ systems failure and requires high complexity decision making for assessment and support, frequent evaluation and titration of therapies, application of advanced monitoring technologies and extensive interpretation of multiple databases.  Critical care time - 35 mins. This represents my time independent of the NPs time  taking care of the pt.  Chilton Greathouse MD [facility] 11/12/2023, 1:40 PM

## 2023-11-12 NOTE — ED Notes (Signed)
ED TO INPATIENT HANDOFF REPORT  ED Nurse Name and Phone #: [phone]  S Name/Age/Gender [name] 72 y.o. female Room/Bed: OTFC/OTF  Code Status   Code Status: Full Code  Home/SNF/Other Home Patient oriented to: self, place, time, and situation Is this baseline? Yes   Triage Complete: Triage complete  Chief Complaint Acute on chronic respiratory failure (HCC) [J96.20]  Triage Note Pt bib ems from home c/o AMS. Family noted the past day she hasn't been acting normal and she had a bluish tint to skin. Pt was on her home O2 3L 70% Pt responded to sternum rub. Pt placed on non-rebreather 96%. Pin point eyes and pt given 1 mg Narcan d/t Vicodin. Hx COPD CHF    BP 152/64 HR 60 CBG 156   Allergies Allergies  Allergen Reactions   Neurontin [Gabapentin] Other (See Comments)    Lightheadedness  Groggy     Level of Care/Admitting Diagnosis ED Disposition     ED Disposition  Admit   Condition  --   Comment  Hospital Area: [facility] [[id number]]  Level of Care: ICU [6]  May admit patient to [facility] or [facility] if equivalent level of care is available:: Yes  Covid Evaluation: Confirmed COVID Negative  Diagnosis: Acute on chronic respiratory failure [facility]) [[id number]]  Admitting Physician: Chilton Greathouse [[id number]]  Attending Physician: Chilton Greathouse [[id number]]  Certification:: I certify this patient will need inpatient services for at least 2 midnights  Expected Medical Readiness: 11/23/2023          B Medical/Surgery History Past Medical History:  Diagnosis Date   Acute respiratory failure with hypoxia and hypercapnia (HCC) 07/19/2023   Anxiety    Atrophic vaginitis 10/09/2015   Baker's cyst    left leg   Blood in urine 10/09/2015   CHF (congestive heart failure) (HCC)    COPD (chronic obstructive pulmonary disease) (HCC)    Heart murmur    Hematuria 10/09/2015   Hypertension    Nicotine addiction 04/07/2014   Oxygen dependent     4L/min [place] all the time   Pneumonia    Urinary retention 07/01/2020   Urinary urgency 01/25/2016   Vaginal bleeding 10/09/2015   Past Surgical History:  Procedure Laterality Date   ABDOMINAL HYSTERECTOMY     CATARACT EXTRACTION Left 03/2015   CESAREAN SECTION     COLONOSCOPY  10/19/2004   Dr. Jena Gauss- internal hemorrhoids, o/w normal rectum, normal colon   COLONOSCOPY N/A 07/30/2014   [id number] diverticulosis. Colonic polyp-removed TA. repeat TCS 07/2021   COLONOSCOPY WITH PROPOFOL N/A 03/29/2021   diverticulosis in sigmoid and descending colon, one 5 mm polyp ascending colon (adenoma), non-bleeding internal hemorrhoids. 5 year surveillance   ESOPHAGOGASTRODUODENOSCOPY N/A 07/30/2014   OVF:IEPPIR EGD   POLYPECTOMY  03/29/2021   Procedure: POLYPECTOMY;  Surgeon: Corbin Ade, MD;  Location: [facility];  Service: Endoscopy;;   RADIAL HEAD ARTHROPLASTY Left 06/23/2017   Procedure: RADIAL HEAD ARTHROPLASTY WITH LIGAMENT REPAIR;  Surgeon: Cammy Copa, MD;  Location: [facility] OR;  Service: Orthopedics;  Laterality: Left;     A IV Location/Drains/Wounds Patient Lines/Drains/Airways Status     Active Line/Drains/Airways     Name Placement date Placement time Site Days   Peripheral IV 11/12/23 18 G Anterior;Left Forearm 11/12/23  1211  Forearm  less than 1   Peripheral IV 11/12/23 20 G Anterior;Proximal;Right Forearm 11/12/23  1434  Forearm  less than 1   Urethral Catheter Olga Coaster RN Double-lumen 14  Fr. 11/12/23  1521  Double-lumen  less than 1            Intake/Output Last 24 hours  Intake/Output Summary (Last 24 hours) at 11/12/2023 1525 Last data filed at 11/12/2023 1500 Gross per 24 hour  Intake 251.23 ml  Output --  Net 251.23 ml    Labs/Imaging Results for orders placed or performed during the hospital encounter of 11/12/23 (from the past 48 hour(s))  Resp panel by RT-PCR (RSV, Flu A&B, Covid) Anterior Nasal Swab     Status: None   Collection Time: 11/12/23  12:13 PM   Specimen: Anterior Nasal Swab  Result Value Ref Range   SARS Coronavirus 2 by RT PCR NEGATIVE NEGATIVE   Influenza A by PCR NEGATIVE NEGATIVE   Influenza B by PCR NEGATIVE NEGATIVE    Comment: (NOTE) The Xpert Xpress SARS-CoV-2/FLU/RSV plus assay is intended as an aid in the diagnosis of influenza from Nasopharyngeal swab specimens and should not be used as a sole basis for treatment. Nasal washings and aspirates are unacceptable for Xpert Xpress SARS-CoV-2/FLU/RSV testing.  Fact Sheet for Patients: BloggerCourse.com  Fact Sheet for Healthcare Providers: SeriousBroker.it  This test is not yet approved or cleared by the [place] FDA and has been authorized for detection and/or diagnosis of SARS-CoV-2 by FDA under an Emergency Use Authorization (EUA). This EUA will remain in effect (meaning this test can be used) for the duration of the COVID-19 declaration under Section 564(b)(1) of the Act, 21 U.S.C. section 360bbb-3(b)(1), unless the authorization is terminated or revoked.     Resp Syncytial Virus by PCR NEGATIVE NEGATIVE    Comment: (NOTE) Fact Sheet for Patients: BloggerCourse.com  Fact Sheet for Healthcare Providers: SeriousBroker.it  This test is not yet approved or cleared by the [place] FDA and has been authorized for detection and/or diagnosis of SARS-CoV-2 by FDA under an Emergency Use Authorization (EUA). This EUA will remain in effect (meaning this test can be used) for the duration of the COVID-19 declaration under Section 564(b)(1) of the Act, 21 U.S.C. section 360bbb-3(b)(1), unless the authorization is terminated or revoked.  Performed at [facility], [address], [place], [place] [address]   Comprehensive metabolic panel     Status: Abnormal   Collection Time: 11/12/23 12:15 PM  Result Value Ref Range   Sodium 141 135 - 145  mmol/L   Potassium 4.8 3.5 - 5.1 mmol/L   Chloride 90 (L) 98 - 111 mmol/L   CO2 >45 (H) 22 - 32 mmol/L   Glucose, Bld 134 (H) 70 - 99 mg/dL    Comment: Glucose reference range applies only to samples taken after fasting for at least 8 hours.   BUN 15 8 - 23 mg/dL   Creatinine, Ser 9.62 0.44 - 1.00 mg/dL   Calcium 8.7 (L) 8.9 - 10.3 mg/dL   Total Protein 6.3 (L) 6.5 - 8.1 g/dL   Albumin 3.6 3.5 - 5.0 g/dL   AST 15 15 - 41 U/L   ALT 14 0 - 44 U/L   Alkaline Phosphatase 67 38 - 126 U/L   Total Bilirubin 0.5 <1.2 mg/dL   GFR, Estimated >95 >28 mL/min    Comment: (NOTE) Calculated using the CKD-EPI Creatinine Equation (2021)    Anion gap NOT CALCULATED 5 - 15    Comment: ELECTROLYTES REPEATED TO VERIFY Performed at [facility], [address] [place], [place] [address]   CBC with Differential/Platelet     Status:  Abnormal   Collection Time: 11/12/23 12:15 PM  Result Value Ref Range   WBC 10.7 (H) 4.0 - 10.5 K/uL   RBC 3.47 (L) 3.87 - 5.11 MIL/uL   Hemoglobin 10.0 (L) 12.0 - 15.0 g/dL   HCT 62.1 30.8 - 65.7 %   MCV 106.9 (H) 80.0 - 100.0 fL   MCH 28.8 26.0 - 34.0 pg   MCHC 27.0 (L) 30.0 - 36.0 g/dL   RDW 84.6 96.2 - 95.2 %   Platelets 156 150 - 400 K/uL   nRBC 0.2 0.0 - 0.2 %   Neutrophils Relative % 79 %   Neutro Abs 8.3 (H) 1.7 - 7.7 K/uL   Lymphocytes Relative 7 %   Lymphs Abs 0.8 0.7 - 4.0 K/uL   Monocytes Relative 10 %   Monocytes Absolute 1.0 0.1 - 1.0 K/uL   Eosinophils Relative 0 %   Eosinophils Absolute 0.0 0.0 - 0.5 K/uL   Basophils Relative 0 %   Basophils Absolute 0.0 0.0 - 0.1 K/uL   Immature Granulocytes 4 %   Abs Immature Granulocytes 0.44 (H) 0.00 - 0.07 K/uL    Comment: Performed at [facility], [address] [place], [place] [address]  Brain natriuretic peptide     Status: Abnormal   Collection Time: 11/12/23 12:15 PM  Result Value Ref Range   B Natriuretic Peptide 487.6 (H) 0.0 - 100.0 pg/mL    Comment: Performed at [facility], [address] [place], [place] [address]   DG Chest Port 1 View  Result Date: 11/12/2023 CLINICAL DATA:  Shortness of breath. EXAM: PORTABLE CHEST 1 VIEW COMPARISON:  10/11/2023. FINDINGS: Redemonstration of nonspecific opacities throughout bilateral lungs. There is homogeneous opacification along the right lower lateral hemithorax, which may represent loculated pleural effusion versus pleural thickening. There are adjacent heterogeneous opacities in the right lower lung zone which may represent combination of atelectasis and/or scarring with or without superimposed pneumonitis. Correlate clinically. There is stable right apical pleural cap. There also left basilar opacities which may represent left lung atelectasis and/or scarring. Left lateral costophrenic angle is not included in the film. However, no large left pleural effusion seen. There is mild pulmonary vascular congestion. No pneumothorax. Bilateral lung fields are clear. Bilateral costophrenic angles are clear. Stable mildly enlarged cardio-mediastinal silhouette. No acute osseous abnormalities. The soft tissues are within normal limits. IMPRESSION: *Mild pulmonary vascular congestion, new since the prior study. *Persistent bibasilar opacities, as described in detail above. Left lateral costophrenic angle is not included however, no large left pleural effusion seen. Electronically Signed   By: Jules Schick M.D.   On: 11/12/2023 12:49    Pending Labs Unresulted Labs (From admission, onward)     Start     Ordered   11/13/23 0500  Triglycerides  (propofol (DIPRIVAN))  Every 72 hours,   R     Comments: While on propofol (DIPRIVAN)    11/12/23 1433   11/13/23 0500  Brain natriuretic peptide  Daily,   R      11/12/23 1435   11/13/23 0500  Basic metabolic panel  Tomorrow morning,   R        11/12/23 1435   11/13/23 0500  CBC  Tomorrow morning,   R        11/12/23 1435   11/13/23 0500  Blood gas, arterial  Tomorrow morning,   R         11/12/23 1435   11/12/23 1600  Blood gas,  arterial  Once,   R        11/12/23 1509   11/12/23 1438  Respiratory (~20 pathogens) panel by PCR  (Respiratory panel by PCR (~20 pathogens, ~24 hr TAT)  w precautions)  Once,   R        11/12/23 1437   11/12/23 1436  Procalcitonin  Daily,   R     References:    Procalcitonin Lower Respiratory Tract Infection AND Sepsis Procalcitonin Algorithm   11/12/23 1435   11/12/23 1435  Strep pneumoniae urinary antigen (not at [facility])  Once,   R        11/12/23 1435   11/12/23 1435  Legionella Pneumophila Serogp 1 Ur Ag  Once,   R        11/12/23 1435   11/12/23 1433  CBC  (heparin)  Once,   R       Comments: Baseline for heparin therapy IF NOT ALREADY DRAWN.  Notify MD if PLT < 100 K.    11/12/23 1435   11/12/23 1433  Creatinine, serum  (heparin)  Once,   R       Comments: Baseline for heparin therapy IF NOT ALREADY DRAWN.    11/12/23 1435   11/12/23 1432  Hemoglobin A1c  Once,   R       Comments: To assess prior glycemic control    11/12/23 1435            Vitals/Pain Today's Vitals   11/12/23 1415 11/12/23 1442 11/12/23 1446 11/12/23 1500  BP: (!) 152/105   (!) 167/75  Pulse: 65   65  Resp: (!) 29   (!) 22  Temp:      TempSrc:      SpO2: 100%  100% 100%  Weight:  80 kg    Height:   5\' 3"  (1.6 m)     Isolation Precautions Droplet precaution  Medications Medications  furosemide (LASIX) injection 40 mg (has no administration in time range)  etomidate (AMIDATE) injection 20 mg (has no administration in time range)  rocuronium (ZEMURON) injection 100 mg (has no administration in time range)  propofol (DIPRIVAN) 1000 MG/100ML infusion (has no administration in time range)  fentaNYL (SUBLIMAZE) injection 25 mcg (has no administration in time range)  fentaNYL (SUBLIMAZE) injection 25-100 mcg (has no administration in time range)  atorvastatin (LIPITOR) tablet 80 mg (has no administration in time range)  escitalopram (LEXAPRO)  tablet 5 mg (has no administration in time range)  pantoprazole (PROTONIX) EC tablet 20 mg (has no administration in time range)  bethanechol (URECHOLINE) tablet 20 mg (has no administration in time range)  tamsulosin (FLOMAX) capsule 0.4 mg (has no administration in time range)  budesonide (PULMICORT) nebulizer solution 0.25 mg (0.25 mg Nebulization Not Given 11/12/23 1518)  azithromycin (ZITHROMAX) 500 mg in dextrose 5 % 250 mL IVPB (has no administration in time range)  methylPREDNISolone sodium succinate (SOLU-MEDROL) 40 mg/mL injection 40 mg (has no administration in time range)  docusate sodium (COLACE) capsule 100 mg (has no administration in time range)  polyethylene glycol (MIRALAX / GLYCOLAX) packet 17 g (has no administration in time range)  insulin aspart (novoLOG) injection 0-15 Units (has no administration in time range)  heparin injection 5,000 Units (has no administration in time range)  sodium chloride flush (NS) 0.9 % injection 3 mL (has no administration in time range)  sodium chloride flush (NS) 0.9 % injection 3 mL (has no administration in time range)  0.9 %  sodium chloride infusion (has no administration in time range)  ipratropium-albuterol (DUONEB) 0.5-2.5 (3) MG/3ML nebulizer solution 3 mL (has no administration in time range)  fentaNYL in NS (64mcg/ml) infusion-PREMIX (25 mcg/hr Intravenous New Bag/Given 11/12/23 1502)  fentaNYL (SUBLIMAZE) bolus via infusion 25-100 mcg (25 mcg Intravenous Not Given 11/12/23 1451)  cefTRIAXone (ROCEPHIN) 2 g in sodium chloride 0.9 % 100 mL IVPB (has no administration in time range)  methylPREDNISolone sodium succinate (SOLU-MEDROL) 125 mg/2 mL injection 125 mg (125 mg Intravenous Given 11/12/23 1249)  albuterol (PROVENTIL) (2.5 MG/3ML) 0.083% nebulizer solution (10 mg/hr Nebulization Given 11/12/23 1226)  azithromycin (ZITHROMAX) 500 mg in dextrose 5 % 250 mL IVPB (0 mg Intravenous Stopped 11/12/23 1459)  etomidate  (AMIDATE) injection (20 mg Intravenous Given 11/12/23 1439)  rocuronium (ZEMURON) injection (90 mg Intravenous Given 11/12/23 1439)    Mobility      Focused Assessments    R Recommendations: See Admitting Provider Note  Report given to:   Additional Notes:

## 2023-11-12 NOTE — Progress Notes (Signed)
Abg reviewed.  Gas exchange acceptable.  Plan Cont full vent support pAD protocol RASS goal -1 Abx steroids nebs and time

## 2023-11-12 NOTE — ED Notes (Signed)
Pt pulled of BiPap mask. RT placed back on pt face

## 2023-11-12 NOTE — ED Provider Notes (Addendum)
[place] EMERGENCY DEPARTMENT AT [facility] Provider Note   CSN: [id number] Arrival date & time: 11/12/23  1207     History  Chief Complaint  Patient presents with   Altered Mental Status    [name] is a 72 y.o. female.  HPI Patient presents in respiratory distress.  EMS provides history.  Level caveat.  EMS reports family called due to altered mental status for the patient.  She was found at home with a bluish tent to her skin, home oxygen 3 L nasal cannula with O2 saturation 70%.  Patient will respond to sternal rub.  Patient was placed on nonrebreather mask on the way to the hospital, received Narcan, given the patient's history of Vicodin.  Patient has history of both COPD and CHF.     Home Medications Prior to Admission medications   Medication Sig Start Date End Date Taking? Authorizing Provider  albuterol (VENTOLIN HFA) 108 (90 Base) MCG/ACT inhaler Inhale 2 puffs into the lungs every 4 (four) hours as needed for wheezing or shortness of breath. For shortness of breath 08/14/23   Nyoka Cowden, MD  ALPRAZolam Prudy Feeler) 0.25 MG tablet Take 0.25 mg by mouth 2 (two) times daily.    [provider]  aspirin EC 81 MG tablet Take 81 mg by mouth daily.    [provider]  atorvastatin (LIPITOR) 80 MG tablet Take 1 tablet (80 mg total) by mouth daily. 07/07/22   Almon Hercules, MD  bethanechol (URECHOLINE) 10 MG tablet Take 2 tablets (20 mg total) by mouth daily. 11/10/23   McKenzie, Mardene Celeste, MD  budesonide (PULMICORT) 0.25 MG/2ML nebulizer solution Take 2 mLs (0.25 mg total) by nebulization 2 (two) times daily. 10/16/23 01/14/24  Carollee Herter, DO  escitalopram (LEXAPRO) 5 MG tablet Take 5 mg by mouth daily.    [provider]  furosemide (LASIX) 20 MG tablet Take 1 tablet (20 mg total) by mouth daily. Patient taking differently: Take 20 mg by mouth daily as needed for fluid or edema. 07/08/22   Almon Hercules, MD  HYDROcodone-acetaminophen  (NORCO) 10-325 MG tablet Take 1 tablet by mouth See admin instructions. 1 tablet every 3-4 hours as needed for pain. 07/14/22   [provider]  ipratropium-albuterol (DUONEB) 0.5-2.5 (3) MG/3ML SOLN Take 3 mLs by nebulization in the morning, at noon, in the evening, and at bedtime. 10/16/23 01/14/24  Carollee Herter, DO  metoprolol tartrate (LOPRESSOR) 25 MG tablet Take 1 tablet (25 mg total) by mouth 2 (two) times daily. 10/16/23 01/15/24  Carollee Herter, DO  Multiple Vitamins-Minerals (CENTRUM ADULT PO) Take 1 tablet by mouth daily.    [provider]  pantoprazole (PROTONIX) 20 MG tablet Take 1 tablet (20 mg total) by mouth daily. 07/08/22   Almon Hercules, MD  tamsulosin (FLOMAX) 0.4 MG CAPS capsule Take 1 capsule (0.4 mg total) by mouth daily. 11/10/23   McKenzie, Mardene Celeste, MD      Allergies    Neurontin [gabapentin]    Review of Systems   Review of Systems  Physical Exam Updated Vital Signs BP (!) 158/77 (BP Location: Right Arm)   Pulse 72   Temp (!) 96.5 F (35.8 C) (Temporal)   Resp (!) 23   SpO2 100%  Physical Exam Vitals and nursing note reviewed.  Constitutional:      General: She is in acute distress.     Appearance: She is well-developed. She is ill-appearing and diaphoretic.  HENT:  Head: Normocephalic and atraumatic.  Eyes:     Conjunctiva/sclera: Conjunctivae normal.  Cardiovascular:     Rate and Rhythm: Normal rate and regular rhythm.  Pulmonary:     Effort: Respiratory distress present.     Breath sounds: Decreased air movement present. No stridor. Decreased breath sounds present.  Abdominal:     General: There is no distension.  Skin:    General: Skin is warm.  Neurological:     Cranial Nerves: No cranial nerve deficit.     Comments: Minimal verbal responses to direct stimuli. Patient does move her extremities to painful stimuli.  Psychiatric:        Cognition and Memory: Cognition is impaired. Memory is impaired.     ED Results /  Procedures / Treatments   Labs (all labs ordered are listed, but only abnormal results are displayed) Labs Reviewed  COMPREHENSIVE METABOLIC PANEL - Abnormal; Notable for the following components:      Result Value   Chloride 90 (*)    CO2 >45 (*)    Glucose, Bld 134 (*)    Calcium 8.7 (*)    Total Protein 6.3 (*)    All other components within normal limits  CBC WITH DIFFERENTIAL/PLATELET - Abnormal; Notable for the following components:   WBC 10.7 (*)    RBC 3.47 (*)    Hemoglobin 10.0 (*)    MCV 106.9 (*)    MCHC 27.0 (*)    Neutro Abs 8.3 (*)    Abs Immature Granulocytes 0.44 (*)    All other components within normal limits  BRAIN NATRIURETIC PEPTIDE - Abnormal; Notable for the following components:   B Natriuretic Peptide 487.6 (*)    All other components within normal limits  RESP PANEL BY RT-PCR (RSV, FLU A&B, COVID)  RVPGX2  BLOOD GAS, ARTERIAL  I-STAT ARTERIAL BLOOD GAS, ED    EKG EKG Interpretation Date/Time:  Sunday November 12 2023 12:51:14 EST Ventricular Rate:  74 PR Interval:  122 QRS Duration:  92 QT Interval:  400 QTC Calculation: 444 R Axis:   61  Text Interpretation: Sinus rhythm Confirmed by Gerhard Munch [phone]) on 11/12/2023 12:54:17 PM  Radiology DG Chest Port 1 View  Result Date: 11/12/2023 CLINICAL DATA:  Shortness of breath. EXAM: PORTABLE CHEST 1 VIEW COMPARISON:  10/11/2023. FINDINGS: Redemonstration of nonspecific opacities throughout bilateral lungs. There is homogeneous opacification along the right lower lateral hemithorax, which may represent loculated pleural effusion versus pleural thickening. There are adjacent heterogeneous opacities in the right lower lung zone which may represent combination of atelectasis and/or scarring with or without superimposed pneumonitis. Correlate clinically. There is stable right apical pleural cap. There also left basilar opacities which may represent left lung atelectasis and/or scarring. Left lateral  costophrenic angle is not included in the film. However, no large left pleural effusion seen. There is mild pulmonary vascular congestion. No pneumothorax. Bilateral lung fields are clear. Bilateral costophrenic angles are clear. Stable mildly enlarged cardio-mediastinal silhouette. No acute osseous abnormalities. The soft tissues are within normal limits. IMPRESSION: *Mild pulmonary vascular congestion, new since the prior study. *Persistent bibasilar opacities, as described in detail above. Left lateral costophrenic angle is not included however, no large left pleural effusion seen. Electronically Signed   By: Jules Schick M.D.   On: 11/12/2023 12:49    Procedures Procedures    Medications Ordered in ED Medications  azithromycin (ZITHROMAX) 500 mg in dextrose 5 % 250 mL IVPB (500 mg Intravenous New Bag/Given 11/12/23 1350)  LORazepam (ATIVAN) injection 0.5 mg (0 mg Intravenous Hold 11/12/23 1345)  methylPREDNISolone sodium succinate (SOLU-MEDROL) 125 mg/2 mL injection 125 mg (125 mg Intravenous Given 11/12/23 1249)  albuterol (PROVENTIL) (2.5 MG/3ML) 0.083% nebulizer solution (10 mg/hr Nebulization Given 11/12/23 1226)    ED Course/ Medical Decision Making/ A&P                                 Medical Decision Making Patient presents in respiratory distress, borderline obtunded.  Patient does offer her name with stimuli, initially has some evidence for protecting her airway, but with concern for ventilation/oxygenation, suspicion for carbon oxide retention patient was placed on BiPAP, continuous monitoring. Cardiac 75 sinus normal line pulse ox 100% via BiPAP abnormal X-ray ordered, labs ordered.  Amount and/or Complexity of Data Reviewed Independent Historian: EMS External Data Reviewed: notes. Labs: ordered. Decision-making details documented in ED Course. Radiology: ordered and independent interpretation performed. Decision-making details documented in ED Course. ECG/medicine tests:  ordered and independent interpretation performed. Decision-making details documented in ED Course.  Risk Prescription drug management. Decision regarding hospitalization. Diagnosis or treatment significantly limited by social determinants of health.  12:38 PM Patient now on BiPAP, opening her eyes spontaneously slightly.  Initial blood gas notable for pH 7.18, pCO2 greater than 110.  2:00 PM With agitation patient did require small dose of Ativan, this has made her much more tolerant of her BiPAP.  Repeat ABG pending. X-ray with vascular congestion, there was elevated BNP and history of diastolic heart failure patient has received Lasix in addition to the previously provided steroids, bronchodilators.  Patient much more calm, but with required need for BiPAP I discussed her case with her critical care colleagues.  Suspicion for multifactorial respiratory distress requiring admission to the ICU.  2:56 PM Patient with declining condition and spite of BiPAP, with efforts to clear CO2.  Second ABG essentially the same, and with decline in mental status airway protection required, patient intubated.  Intubation note below, first pass, no complications.  Final Clinical Impression(s) / ED Diagnoses Final diagnoses:  Respiratory distress   CRITICAL CARE Performed by: Gerhard Munch Total critical care time: 35 minutes Critical care time was exclusive of separately billable procedures and treating other patients. Critical care was necessary to treat or prevent imminent or life-threatening deterioration. Critical care was time spent personally by me on the following activities: development of treatment plan with patient and/or surrogate as well as nursing, discussions with consultants, evaluation of patient's response to treatment, examination of patient, obtaining history from patient or surrogate, ordering and performing treatments and interventions, ordering and review of laboratory studies,  ordering and review of radiographic studies, pulse oximetry and re-evaluation of patient's condition.   INTUBATION Performed by: Gerhard Munch  Required items: required blood products, implants, devices, and special equipment available Patient identity confirmed: provided demographic data and hospital-assigned identification number Time out: Immediately prior to procedure a "time out" was called to verify the correct patient, procedure, equipment, support staff and site/side marked as required.  Indications: airway protection  Intubation method: Glidescope Laryngoscopy   Preoxygenation: bipap  Sedatives: 20Etomidate Paralytic: 90 rocuronium  Tube Size: 7.5 cuffed  Post-procedure assessment: chest rise and ETCO2 monitor Breath sounds: equal and absent over the epigastrium Tube secured with: ETT holder  Patient went to ICU before I could review repeat x-ray, but with clear breath sounds bilaterally, appropriate end-tidal CO2, and no resistance on mechanical ventilation, tube  is most likely n the correct place.  Patient tolerated the procedure well with no immediate complications.   Gerhard Munch, MD 11/12/23 1402    Gerhard Munch, MD 11/12/23 [phone]

## 2023-11-13 ENCOUNTER — Inpatient Hospital Stay (HOSPITAL_COMMUNITY): Payer: Medicare Other

## 2023-11-13 DIAGNOSIS — J9621 Acute and chronic respiratory failure with hypoxia: Secondary | ICD-10-CM

## 2023-11-13 DIAGNOSIS — J9622 Acute and chronic respiratory failure with hypercapnia: Secondary | ICD-10-CM

## 2023-11-13 LAB — CBC
HCT: 33.3 % — ABNORMAL LOW (ref 36.0–46.0)
Hemoglobin: 10 g/dL — ABNORMAL LOW (ref 12.0–15.0)
MCH: 29.5 pg (ref 26.0–34.0)
MCHC: 30 g/dL (ref 30.0–36.0)
MCV: 98.2 fL (ref 80.0–100.0)
Platelets: 168 10*3/uL (ref 150–400)
RBC: 3.39 MIL/uL — ABNORMAL LOW (ref 3.87–5.11)
RDW: 12.9 % (ref 11.5–15.5)
WBC: 7.9 10*3/uL (ref 4.0–10.5)
nRBC: 0 % (ref 0.0–0.2)

## 2023-11-13 LAB — POCT I-STAT 7, (LYTES, BLD GAS, ICA,H+H)
Acid-Base Excess: 25 mmol/L — ABNORMAL HIGH (ref 0.0–2.0)
Acid-Base Excess: 21 mmol/L — ABNORMAL HIGH (ref 0.0–2.0)
Bicarbonate: 50.5 mmol/L — ABNORMAL HIGH (ref 20.0–28.0)
Bicarbonate: 45.6 mmol/L — ABNORMAL HIGH (ref 20.0–28.0)
Calcium, Ion: 1.07 mmol/L — ABNORMAL LOW (ref 1.15–1.40)
Calcium, Ion: 1.09 mmol/L — ABNORMAL LOW (ref 1.15–1.40)
HCT: 31 % — ABNORMAL LOW (ref 36.0–46.0)
HCT: 33 % — ABNORMAL LOW (ref 36.0–46.0)
Hemoglobin: 10.5 g/dL — ABNORMAL LOW (ref 12.0–15.0)
Hemoglobin: 11.2 g/dL — ABNORMAL LOW (ref 12.0–15.0)
O2 Saturation: 97 %
O2 Saturation: 98 %
Patient temperature: 37.7
Potassium: 2.9 mmol/L — ABNORMAL LOW (ref 3.5–5.1)
Potassium: 2.6 mmol/L — CL (ref 3.5–5.1)
Sodium: 135 mmol/L (ref 135–145)
Sodium: 135 mmol/L (ref 135–145)
TCO2: >50 mmol/L — ABNORMAL HIGH (ref 22–32)
TCO2: 47 mmol/L — ABNORMAL HIGH (ref 22–32)
pCO2 arterial: 57.9 mm[Hg] — ABNORMAL HIGH (ref 32–48)
pCO2 arterial: 51.8 mm[Hg] — ABNORMAL HIGH (ref 32–48)
pH, Arterial: 7.552 — ABNORMAL HIGH (ref 7.35–7.45)
pH, Arterial: 7.552 — ABNORMAL HIGH (ref 7.35–7.45)
pO2, Arterial: 82 mm[Hg] — ABNORMAL LOW (ref 83–108)
pO2, Arterial: 88 mm[Hg] (ref 83–108)

## 2023-11-13 LAB — GLUCOSE, CAPILLARY
Glucose-Capillary: 118 mg/dL — ABNORMAL HIGH (ref 70–99)
Glucose-Capillary: 126 mg/dL — ABNORMAL HIGH (ref 70–99)
Glucose-Capillary: 146 mg/dL — ABNORMAL HIGH (ref 70–99)
Glucose-Capillary: 148 mg/dL — ABNORMAL HIGH (ref 70–99)
Glucose-Capillary: 155 mg/dL — ABNORMAL HIGH (ref 70–99)
Glucose-Capillary: 168 mg/dL — ABNORMAL HIGH (ref 70–99)
Glucose-Capillary: 174 mg/dL — ABNORMAL HIGH (ref 70–99)

## 2023-11-13 LAB — PROCALCITONIN: Procalcitonin: 0.1 ng/mL

## 2023-11-13 LAB — TRIGLYCERIDES: Triglycerides: 151 mg/dL — ABNORMAL HIGH (ref <150)

## 2023-11-13 LAB — BASIC METABOLIC PANEL
Anion gap: 10 (ref 5–15)
BUN: 15 mg/dL (ref 8–23)
CO2: 43 mmol/L — ABNORMAL HIGH (ref 22–32)
Calcium: 9 mg/dL (ref 8.9–10.3)
Chloride: 85 mmol/L — ABNORMAL LOW (ref 98–111)
Creatinine, Ser: 0.74 mg/dL (ref 0.44–1.00)
GFR, Estimated: >60 mL/min (ref >60)
Glucose, Bld: 164 mg/dL — ABNORMAL HIGH (ref 70–99)
Potassium: 2.8 mmol/L — ABNORMAL LOW (ref 3.5–5.1)
Sodium: 138 mmol/L (ref 135–145)

## 2023-11-13 LAB — MAGNESIUM: Magnesium: 1.8 mg/dL (ref 1.7–2.4)

## 2023-11-13 LAB — BRAIN NATRIURETIC PEPTIDE: B Natriuretic Peptide: 269.3 pg/mL — ABNORMAL HIGH (ref 0.0–100.0)

## 2023-11-13 MED ORDER — POTASSIUM CHLORIDE CRYS ER 20 MEQ PO TBCR: 40.0000 meq | EXTENDED_RELEASE_TABLET | Freq: Once | ORAL | Status: DC

## 2023-11-13 MED ORDER — ATORVASTATIN CALCIUM 40 MG PO TABS
80.0000 mg | ORAL_TABLET | Freq: Every day | ORAL | Status: DC
  Administered 2023-11-14: 80 mg
  Filled 2023-11-13: qty 2

## 2023-11-13 MED ORDER — METOPROLOL TARTRATE 25 MG PO TABS
25.0000 mg | ORAL_TABLET | Freq: Two times a day (BID) | ORAL | Status: DC
  Administered 2023-11-13 – 2023-11-14 (×3): 25 mg
  Filled 2023-11-13 (×3): qty 1

## 2023-11-13 MED ORDER — MIDAZOLAM HCL 2 MG/2ML IJ SOLN
1.0000 mg | INTRAMUSCULAR | Status: DC | PRN
  Administered 2023-11-13: 1 mg via INTRAVENOUS
  Filled 2023-11-13: qty 2

## 2023-11-13 MED ORDER — MAGNESIUM SULFATE 2 GM/50ML IV SOLN
2.0000 g | Freq: Once | INTRAVENOUS | Status: AC
  Administered 2023-11-13: 2 g via INTRAVENOUS
  Filled 2023-11-13: qty 50

## 2023-11-13 MED ORDER — IPRATROPIUM-ALBUTEROL 0.5-2.5 (3) MG/3ML IN SOLN
3.0000 mL | Freq: Four times a day (QID) | RESPIRATORY_TRACT | Status: DC
  Administered 2023-11-14 – 2023-11-15 (×4): 3 mL via RESPIRATORY_TRACT
  Filled 2023-11-13 (×4): qty 3

## 2023-11-13 MED ORDER — STERILE WATER FOR INJECTION IJ SOLN
INTRAMUSCULAR | Status: AC
  Administered 2023-11-13: 5 mL
  Filled 2023-11-13: qty 10

## 2023-11-13 MED ORDER — CHLORHEXIDINE GLUCONATE CLOTH 2 % EX PADS
6.0000 | MEDICATED_PAD | Freq: Every day | CUTANEOUS | Status: DC
  Administered 2023-11-14 – 2023-11-16 (×3): 6 via TOPICAL

## 2023-11-13 MED ORDER — FUROSEMIDE 10 MG/ML IJ SOLN
40.0000 mg | Freq: Once | INTRAMUSCULAR | Status: AC
  Administered 2023-11-13: 40 mg via INTRAVENOUS
  Filled 2023-11-13: qty 4

## 2023-11-13 MED ORDER — POTASSIUM CHLORIDE 20 MEQ PO PACK
40.0000 meq | PACK | Freq: Once | ORAL | Status: AC
  Administered 2023-11-13: 40 meq
  Filled 2023-11-13: qty 2

## 2023-11-13 MED ORDER — ACETAZOLAMIDE SODIUM 500 MG IJ SOLR
250.0000 mg | Freq: Once | INTRAMUSCULAR | Status: AC
  Administered 2023-11-13: 250 mg via INTRAVENOUS
  Filled 2023-11-13: qty 250

## 2023-11-13 MED ORDER — METHYLPREDNISOLONE SODIUM SUCC 40 MG IJ SOLR
40.0000 mg | INTRAMUSCULAR | Status: DC
  Administered 2023-11-14 – 2023-11-15 (×2): 40 mg via INTRAVENOUS
  Filled 2023-11-13 (×2): qty 1

## 2023-11-13 NOTE — Progress Notes (Addendum)
NAME:  [name], MRN:  [id number], DOB:  Apr 19, 1951, LOS: 1 ADMISSION DATE:  11/12/2023, CONSULTATION DATE: 11/17 REFERRING MD: Jeraldine Loots, CHIEF COMPLAINT: Acute on chronic hypoxic and hypercarbic respiratory failure  History of Present Illness:  72 year old female patient followed by Dr. Sherene Sires for chronic respiratory failure in the setting of Gold 4 COPD typically maintained on 2 L nasal cannula With baseline exertional dyspnea noted last seen as a hospital follow-up on August 14, 2023 following hospitalization for sepsis from urinary tract infection, complicated further by exacerbation of her underlying lung disease, just discharged once again on 10/22 after being hospitalized for acute hypercarbic respiratory failure, encephalopathy, due to decompensated diastolic heart failure +/- pneumonia treated with antibiotics, nebulized therapy, and systemic steroids and improved. On 11/12 patient called our office reporting 2 to 3-day history of worsening shortness of breath denied cough.  Did endorse shortness of breath with minimal exertion.  Pulse oximetry at rest 78% while on telephone and oxygen titrated to 3.5 L.  It was recommended that she be seen in the emergency room, the patient declined as per last ER visit resulted in several hours in the waiting room.  On 11/17 family called EMS as the patient was found to be less responsive, would only respond to sternal rub pulse oximetry 70% on 3 L.  Minimally arousable on presentation to the ER.  Placed on BiPAP.  Started on IV antibiotics and systemic steroids.  Initial blood gas notable for pH 7.18 pCO2 greater than 110.  Following placement of BiPAP patient did become a little more interactive and confused, pulling at BiPAP mask for this she also received small dose of Ativan.  Because of her acute on chronic respiratory failure pulmonary asked to admit.  Pertinent  Medical History  Chronic respiratory failure on home oxygen 2 L, Gold 4 COPD diastolic  heart failure, hypertension , Chronic back pain, urinary retention, prior pyelonephritis and sepsis, normocytic anemia, left lower lobe pulmonary nodule, obesity  Significant Hospital Events: Including procedures, antibiotic start and stop dates in addition to other pertinent events   11/17 admitted with acute on chronic hypoxic and hypercarbic respiratory failure and associated  hypercarbic encephalopathy started on broad-spectrum antibiotics, systemic steroids, and scheduled bronchodilators in addition to noninvasive positive pressure ventilation  Interim History / Subjective:   Failed trial of NIPPV and required intubation 11/17 Resting comfortably Objective   Blood pressure (!) 141/56, pulse 76, temperature 98.6 F (37 C), resp. rate 12, height 5\' 3"  (1.6 m), weight 84.6 kg, SpO2 95%.    Vent Mode: PRVC FiO2 (%):  [40 %-100 %] 40 % Set Rate:  [12 bmp-20 bmp] 12 bmp Vt Set:  [410 mL] 410 mL PEEP:  [5 cmH20] 5 cmH20 Plateau Pressure:  [17 cmH20-27 cmH20] 17 cmH20   Intake/Output Summary (Last 24 hours) at 11/13/2023 1042 Last data filed at 11/13/2023 0600 Gross per 24 hour  Intake 844.63 ml  Output 2475 ml  Net -1630.37 ml   Filed Weights   11/12/23 1442 11/12/23 1612 11/13/23 0500  Weight: 80 kg 82.3 kg 84.6 kg    Examination: Gen:      Intubated, sedated, acutely ill appearing HEENT:  ETT to vent Lungs:    sounds of mechanical ventilation auscultated, no wheeze, symmetric breath sounds CV:         tachycardic, regular on my exam Abd:      + bowel sounds; soft, non-tender; no palpable masses, no distension Ext:    No edema Skin:  Warm and dry; no rashes Neuro:   sedated, RASS -3   Imaging/Labs reviewed Chest xray reviewed today shows: atelectasis right loser lobe, left sided opacity, basilar ETT in place.  ABT post intubation with respiratory alkalosis  Resolved Hospital Problem list     Assessment & Plan:  Acute on chronic hypoxic and hypercarbic  respiratory failure secondary to acute exacerbation of chronic obstructive pulmonary disease, +/- CAP -Has a history of chronic respiratory failure, recently hospitalized for pneumonia and decompensated diastolic dysfunction.   Follow urinary strep and Legionella antigen as well as RVP Scheduled systemic steroids, will transition to once daily Scheduled bronchodilators and inhaled corticosteroids IV ceftriaxone and azithromycin Continuous pulse ox I did decrease her RR accordingly with respiratory alkalosis Will try to SBT/SAT and extubate starting 11/19. Should try to get her home bipap at discharge  Acute metabolic/hypercarbic encephalopathy  Plan Holding sedating medications Support respiratory failure Avoiding fever Keep euglycemic, increase SSI and add long acting as needed  Acutely decompensated HFpEF HTN Plan BNP elevated, redose lasix BID today Continue telemetry monitoring And continue aspirin, atorvastatin, and Lopressor  History of urinary retention and recurrent UTIs Plan Continue Urecholine and Flomax  Hypokalemia - replace and repeat BMET  History of anxiety and depression Plan Holding alprazolam for now given altered state, continue Lexapro  Prn versed, may consider adding back enteral  benzos depending on duration of mechanical ventilation chronic back pain Plan Treat as needed  Best Practice (right click and "Reselect all SmartList Selections" daily)   Diet/type: NPO w/ oral meds DVT prophylaxis: prophylactic heparin  GI prophylaxis: PPI Lines: N/A Foley:  N/A Code Status:  full code Last date of multidisciplinary goals of care discussion [updated sister dianne barley. she will update her daughter. [name] daughter had a stroke and is not able to communicate.  Full code.]  Critical care time: min     The patient is critically ill due to respiratory failure.  Critical care was necessary to treat or prevent imminent or life-threatening deterioration.   Critical care was time spent personally by me on the following activities: development of treatment plan with patient and/or surrogate as well as nursing, discussions with consultants, evaluation of patient's response to treatment, examination of patient, obtaining history from patient or surrogate, ordering and performing treatments and interventions, ordering and review of laboratory studies, ordering and review of radiographic studies, pulse oximetry, re-evaluation of patient's condition and participation in multidisciplinary rounds.   Critical Care Time devoted to patient care services described in this note is 36 minutes. This time reflects time of care of this signee Charlott Holler . This critical care time does not reflect separately billable procedures or procedure time, teaching time or supervisory time of PA/NP/Med student/Med Resident etc but could involve care discussion time.       Charlott Holler [facility] 11/13/2023 10:53 AM  Pager: see AMION  If no response to pager , please call critical care on call (see AMION) until 7pm After 7:00 pm call Elink

## 2023-11-13 NOTE — TOC CM/SW Note (Addendum)
Transition of Care [facility]) - Inpatient Brief Assessment   Patient Details  Name: [name] MRN: [id number] Date of Birth: [birth year]  Transition of Care [facility]) CM/SW Contact:    Tom-Johnson, [name], RN Phone Number: 11/13/2023, 2:31 PM   Clinical Narrative:  Patient presented to the ED with Altered Mental Status and Respiratory Distress. Patient has hx of COPD, Chronic Respiratory Failure, 3L O2 at home. Intubated and Sedated. On IV abx, IV Steroids and Bronchodilators.   No TOC needs or recommendations noted at this time.  Patient not Medically ready for discharge.  CM will continue to follow as patient progresses with care towards discharge.        Transition of Care Asessment: Insurance and Status: Insurance coverage has been reviewed Patient has primary care physician: Yes Home environment has been reviewed: Yes Prior level of function:: Modified Independent Prior/Current Home Services: No current home services Social Determinants of Health Reivew: SDOH reviewed no interventions necessary Readmission risk has been reviewed: Yes Transition of care needs: transition of care needs identified, TOC will continue to follow

## 2023-11-14 ENCOUNTER — Encounter: Payer: Self-pay | Admitting: Urology

## 2023-11-14 DIAGNOSIS — J441 Chronic obstructive pulmonary disease with (acute) exacerbation: Secondary | ICD-10-CM | POA: Diagnosis not present

## 2023-11-14 DIAGNOSIS — J189 Pneumonia, unspecified organism: Secondary | ICD-10-CM | POA: Diagnosis not present

## 2023-11-14 DIAGNOSIS — J9621 Acute and chronic respiratory failure with hypoxia: Secondary | ICD-10-CM | POA: Diagnosis not present

## 2023-11-14 DIAGNOSIS — J9622 Acute and chronic respiratory failure with hypercapnia: Secondary | ICD-10-CM | POA: Diagnosis not present

## 2023-11-14 LAB — GLUCOSE, CAPILLARY
Glucose-Capillary: 124 mg/dL — ABNORMAL HIGH (ref 70–99)
Glucose-Capillary: 133 mg/dL — ABNORMAL HIGH (ref 70–99)
Glucose-Capillary: 139 mg/dL — ABNORMAL HIGH (ref 70–99)
Glucose-Capillary: 112 mg/dL — ABNORMAL HIGH (ref 70–99)
Glucose-Capillary: 74 mg/dL (ref 70–99)
Glucose-Capillary: 89 mg/dL (ref 70–99)

## 2023-11-14 LAB — BASIC METABOLIC PANEL
Anion gap: 11 (ref 5–15)
BUN: 18 mg/dL (ref 8–23)
CO2: 37 mmol/L — ABNORMAL HIGH (ref 22–32)
Calcium: 8.6 mg/dL — ABNORMAL LOW (ref 8.9–10.3)
Chloride: 89 mmol/L — ABNORMAL LOW (ref 98–111)
Creatinine, Ser: 0.59 mg/dL (ref 0.44–1.00)
GFR, Estimated: >60 mL/min (ref >60)
Glucose, Bld: 99 mg/dL (ref 70–99)
Potassium: 4.2 mmol/L (ref 3.5–5.1)
Sodium: 137 mmol/L (ref 135–145)

## 2023-11-14 LAB — PROCALCITONIN: Procalcitonin: <0.1 ng/mL

## 2023-11-14 LAB — BRAIN NATRIURETIC PEPTIDE: B Natriuretic Peptide: 164.3 pg/mL — ABNORMAL HIGH (ref 0.0–100.0)

## 2023-11-14 MED ORDER — BETHANECHOL CHLORIDE 10 MG PO TABS
20.0000 mg | ORAL_TABLET | Freq: Every day | ORAL | Status: DC
Start: 2023-11-15 — End: 2023-11-17
  Administered 2023-11-15 – 2023-11-17 (×3): 20 mg via ORAL
  Filled 2023-11-14 (×3): qty 2

## 2023-11-14 MED ORDER — ATORVASTATIN CALCIUM 40 MG PO TABS
80.0000 mg | ORAL_TABLET | Freq: Every day | ORAL | Status: DC
  Administered 2023-11-15 – 2023-11-17 (×3): 80 mg via ORAL
  Filled 2023-11-14 (×3): qty 2

## 2023-11-14 MED ORDER — ACETAMINOPHEN 325 MG PO TABS
650.0000 mg | ORAL_TABLET | Freq: Four times a day (QID) | ORAL | Status: DC | PRN
  Administered 2023-11-15 (×2): 650 mg
  Filled 2023-11-14 (×3): qty 2

## 2023-11-14 MED ORDER — FUROSEMIDE 10 MG/ML IJ SOLN
40.0000 mg | Freq: Once | INTRAMUSCULAR | Status: AC
  Administered 2023-11-14: 40 mg via INTRAVENOUS
  Filled 2023-11-14: qty 4

## 2023-11-14 MED ORDER — ESCITALOPRAM OXALATE 10 MG PO TABS
5.0000 mg | ORAL_TABLET | Freq: Every day | ORAL | Status: DC
  Administered 2023-11-15 – 2023-11-17 (×3): 5 mg via ORAL
  Filled 2023-11-14 (×3): qty 1

## 2023-11-14 MED ORDER — METOPROLOL TARTRATE 25 MG PO TABS
25.0000 mg | ORAL_TABLET | Freq: Two times a day (BID) | ORAL | Status: DC
  Administered 2023-11-14 – 2023-11-17 (×6): 25 mg via ORAL
  Filled 2023-11-14 (×6): qty 1

## 2023-11-14 MED ORDER — ORAL CARE MOUTH RINSE: 15.0000 mL | OROMUCOSAL | Status: DC | PRN

## 2023-11-14 NOTE — Patient Instructions (Signed)
Urinary Tract Infection, Adult  A urinary tract infection (UTI) is an infection of any part of the urinary tract. The urinary tract includes the kidneys, ureters, bladder, and urethra. These organs make, store, and get rid of urine in the body. An upper UTI affects the ureters and kidneys. A lower UTI affects the bladder and urethra. What are the causes? Most urinary tract infections are caused by bacteria in your genital area around your urethra, where urine leaves your body. These bacteria grow and cause inflammation of your urinary tract. What increases the risk? You are more likely to develop this condition if: You have a urinary catheter that stays in place. You are not able to control when you urinate or have a bowel movement (incontinence). You are female and you: Use a spermicide or diaphragm for birth control. Have low estrogen levels. Are pregnant. You have certain genes that increase your risk. You are sexually active. You take antibiotic medicines. You have a condition that causes your flow of urine to slow down, such as: An enlarged prostate, if you are female. Blockage in your urethra. A kidney stone. A nerve condition that affects your bladder control (neurogenic bladder). Not getting enough to drink, or not urinating often. You have certain medical conditions, such as: Diabetes. A weak disease-fighting system (immunesystem). Sickle cell disease. Gout. Spinal cord injury. What are the signs or symptoms? Symptoms of this condition include: Needing to urinate right away (urgency). Frequent urination. This may include small amounts of urine each time you urinate. Pain or burning with urination. Blood in the urine. Urine that smells bad or unusual. Trouble urinating. Cloudy urine. Vaginal discharge, if you are female. Pain in the abdomen or the lower back. You may also have: Vomiting or a decreased appetite. Confusion. Irritability or tiredness. A fever or  chills. Diarrhea. The first symptom in older adults may be confusion. In some cases, they may not have any symptoms until the infection has worsened. How is this diagnosed? This condition is diagnosed based on your medical history and a physical exam. You may also have other tests, including: Urine tests. Blood tests. Tests for STIs (sexually transmitted infections). If you have had more than one UTI, a cystoscopy or imaging studies may be done to determine the cause of the infections. How is this treated? Treatment for this condition includes: Antibiotic medicine. Over-the-counter medicines to treat discomfort. Drinking enough water to stay hydrated. If you have frequent infections or have other conditions such as a kidney stone, you may need to see a health care provider who specializes in the urinary tract (urologist). In rare cases, urinary tract infections can cause sepsis. Sepsis is a life-threatening condition that occurs when the body responds to an infection. Sepsis is treated in the hospital with IV antibiotics, fluids, and other medicines. Follow these instructions at home:  Medicines Take over-the-counter and prescription medicines only as told by your health care provider. If you were prescribed an antibiotic medicine, take it as told by your health care provider. Do not stop using the antibiotic even if you start to feel better. General instructions Make sure you: Empty your bladder often and completely. Do not hold urine for long periods of time. Empty your bladder after sex. Wipe from front to back after urinating or having a bowel movement if you are female. Use each tissue only one time when you wipe. Drink enough fluid to keep your urine pale yellow. Keep all follow-up visits. This is important. Contact a health   care provider if: Your symptoms do not get better after 1-2 days. Your symptoms go away and then return. Get help right away if: You have severe pain in  your back or your lower abdomen. You have a fever or chills. You have nausea or vomiting. Summary A urinary tract infection (UTI) is an infection of any part of the urinary tract, which includes the kidneys, ureters, bladder, and urethra. Most urinary tract infections are caused by bacteria in your genital area. Treatment for this condition often includes antibiotic medicines. If you were prescribed an antibiotic medicine, take it as told by your health care provider. Do not stop using the antibiotic even if you start to feel better. Keep all follow-up visits. This is important. This information is not intended to replace advice given to you by your health care provider. Make sure you discuss any questions you have with your health care provider. Document Revised: 07/19/2020 Document Reviewed: 07/24/2020 Elsevier Patient Education  2024 [employer]  

## 2023-11-14 NOTE — Progress Notes (Signed)
Patient re-examined this afternoon. With sedation off was awake and alert and passing PS trial. Planned extubation to bipap with orders placed for nocturnal bipap given her chronic hypercapnia.   Additional cc time  Durel Salts, MD Pulmonary and Critical Care Medicine [facility] 11/14/2023 4:57 PM Pager: see AMION  If no response to pager, please call critical care on call (see AMION) until 7pm After 7:00 pm call Elink

## 2023-11-14 NOTE — Procedures (Signed)
Extubation Procedure Note  Patient Details:   Name: [name] DOB: [birth year] MRN: [id number]   Airway Documentation:    Vent end date: 11/14/23 Vent end time: 1642   Evaluation  O2 sats: stable throughout Complications: No apparent complications Patient did tolerate procedure well. Bilateral Breath Sounds: Clear, Diminished   Yes, pt could speak post extubation.  Pt extubated successfully to bipap.  Audrie Lia 11/14/2023, 4:42 PM

## 2023-11-14 NOTE — Plan of Care (Signed)
  Problem: Education: Goal: Ability to describe self-care measures that may prevent or decrease complications (Diabetes Survival Skills Education) will improve Outcome: Progressing   Problem: Coping: Goal: Ability to adjust to condition or change in health will improve Outcome: Progressing   Problem: Fluid Volume: Goal: Ability to maintain a balanced intake and output will improve Outcome: Progressing   Problem: Health Behavior/Discharge Planning: Goal: Ability to identify and utilize available resources and services will improve Outcome: Progressing Goal: Ability to manage health-related needs will improve Outcome: Progressing   Problem: Metabolic: Goal: Ability to maintain appropriate glucose levels will improve Outcome: Progressing   Problem: Nutritional: Goal: Maintenance of adequate nutrition will improve Outcome: Progressing Goal: Progress toward achieving an optimal weight will improve Outcome: Progressing   Problem: Skin Integrity: Goal: Risk for impaired skin integrity will decrease Outcome: Progressing   Problem: Tissue Perfusion: Goal: Adequacy of tissue perfusion will improve Outcome: Progressing   Problem: Education: Goal: Knowledge of General Education information will improve Description: Including pain rating scale, medication(s)/side effects and non-pharmacologic comfort measures Outcome: Progressing   Problem: Health Behavior/Discharge Planning: Goal: Ability to manage health-related needs will improve Outcome: Progressing   Problem: Clinical Measurements: Goal: Ability to maintain clinical measurements within normal limits will improve Outcome: Progressing Goal: Will remain free from infection Outcome: Progressing Goal: Diagnostic test results will improve Outcome: Progressing Goal: Respiratory complications will improve Outcome: Progressing Goal: Cardiovascular complication will be avoided Outcome: Progressing   Problem: Activity: Goal:  Risk for activity intolerance will decrease Outcome: Progressing   Problem: Nutrition: Goal: Adequate nutrition will be maintained Outcome: Progressing   Problem: Coping: Goal: Level of anxiety will decrease Outcome: Progressing   Problem: Elimination: Goal: Will not experience complications related to bowel motility Outcome: Progressing Goal: Will not experience complications related to urinary retention Outcome: Progressing   Problem: Pain Management: Goal: General experience of comfort will improve Outcome: Progressing   Problem: Safety: Goal: Ability to remain free from injury will improve Outcome: Progressing   Problem: Skin Integrity: Goal: Risk for impaired skin integrity will decrease Outcome: Progressing   Problem: Activity: Goal: Ability to tolerate increased activity will improve Outcome: Progressing   Problem: Respiratory: Goal: Ability to maintain a clear airway and adequate ventilation will improve Outcome: Progressing   Problem: Role Relationship: Goal: Method of communication will improve Outcome: Progressing

## 2023-11-14 NOTE — Progress Notes (Signed)
eLink Physician-Brief Progress Note Patient Name: [name] DOB: 06/24/1951 MRN: [id number]   Date of Service  11/14/2023  HPI/Events of Note  72 year old female initially presented with acute on chronic respiratory failure with hypercapnia respiratory failure and was previously intubated.  Now extubated with use of intermittent noninvasive positive pressure ventilation.  Has chronic back pain, requesting Tylenol.  eICU Interventions  P.o. Tylenol ordered.  Passed bedside swallow study appropriately.     Intervention Category Intermediate Interventions: Pain - evaluation and management  Isham Smitherman 11/14/2023, 8:44 PM

## 2023-11-14 NOTE — Progress Notes (Signed)
NAME:  [name], MRN:  [id number], DOB:  06/20/1951, LOS: 2 ADMISSION DATE:  11/12/2023, CONSULTATION DATE: 11/17 REFERRING MD: Jeraldine Loots, CHIEF COMPLAINT: Acute on chronic hypoxic and hypercarbic respiratory failure  History of Present Illness:  72 year old female patient followed by Dr. Sherene Sires for chronic respiratory failure in the setting of Gold 4 COPD typically maintained on 2 L nasal cannula With baseline exertional dyspnea noted last seen as a hospital follow-up on August 14, 2023 following hospitalization for sepsis from urinary tract infection, complicated further by exacerbation of her underlying lung disease, just discharged once again on 10/22 after being hospitalized for acute hypercarbic respiratory failure, encephalopathy, due to decompensated diastolic heart failure +/- pneumonia treated with antibiotics, nebulized therapy, and systemic steroids and improved. On 11/12 patient called our office reporting 2 to 3-day history of worsening shortness of breath denied cough.  Did endorse shortness of breath with minimal exertion.  Pulse oximetry at rest 78% while on telephone and oxygen titrated to 3.5 L.  It was recommended that she be seen in the emergency room, the patient declined as per last ER visit resulted in several hours in the waiting room.  On 11/17 family called EMS as the patient was found to be less responsive, would only respond to sternal rub pulse oximetry 70% on 3 L.  Minimally arousable on presentation to the ER.  Placed on BiPAP.  Started on IV antibiotics and systemic steroids.  Initial blood gas notable for pH 7.18 pCO2 greater than 110.  Following placement of BiPAP patient did become a little more interactive and confused, pulling at BiPAP mask for this she also received small dose of Ativan.  Because of her acute on chronic respiratory failure pulmonary asked to admit.  Pertinent  Medical History  Chronic respiratory failure on home oxygen 2 L, Gold 4 COPD diastolic  heart failure, hypertension , Chronic back pain, urinary retention, prior pyelonephritis and sepsis, normocytic anemia, left lower lobe pulmonary nodule, obesity  Significant Hospital Events: Including procedures, antibiotic start and stop dates in addition to other pertinent events   11/17 admitted with acute on chronic hypoxic and hypercarbic respiratory failure and associated  hypercarbic encephalopathy started on broad-spectrum antibiotics, systemic steroids, and scheduled bronchodilators in addition to noninvasive positive pressure ventilation  Interim History / Subjective:   No overnight issues. High sedation needs.  Objective   Blood pressure (!) 142/61, pulse 60, temperature (!) 97.5 F (36.4 C), resp. rate 12, height 5\' 3"  (1.6 m), weight 79.3 kg, SpO2 96%.    Vent Mode: PRVC FiO2 (%):  [40 %] 40 % Set Rate:  [12 bmp] 12 bmp Vt Set:  [410 mL] 410 mL PEEP:  [5 cmH20] 5 cmH20 Plateau Pressure:  [18 cmH20-20 cmH20] 19 cmH20   Intake/Output Summary (Last 24 hours) at 11/14/2023 1133 Last data filed at 11/14/2023 1000 Gross per 24 hour  Intake 1367.19 ml  Output 2460 ml  Net -1092.81 ml   Filed Weights   11/12/23 1612 11/13/23 0500 11/14/23 0500  Weight: 82.3 kg 84.6 kg 79.3 kg    Examination: Gen:      Intubated, sedated, acutely ill appearing HEENT:  ETT to vent Lungs:    sounds of mechanical ventilation auscultated no wheeze CV:         RRR Abd:      + bowel sounds; soft, non-tender; no palpable masses, no distension Ext:    No edema Skin:      Warm and dry; raised blanched skin  surrounding are of heparin injection, no pain or erythema Neuro:   sedated, RASS -3, moves all 4 extremities    Imaging/Labs reviewed Na 137 K 4.2 Cr 0.59 BNP 164 PCT<.10  Resolved Hospital Problem list     Assessment & Plan:  Acute on chronic hypoxic and hypercarbic respiratory failure secondary to acute exacerbation of chronic obstructive pulmonary disease, +/- CAP - suspect  largely the copd exacerbation is secondary to heart failure - pct low, will stop abx - 5 days steroids - SAT/SBT today, lung protective ventilation with vap bundle until she can be safely extubated to bipap Should try to get her home bipap at discharge  Acute metabolic/hypercarbic encephalopathy  Plan Holding sedating medications Support respiratory failure Avoiding fever Keep euglycemic, increase SSI and add long acting as needed  Acutely decompensated HFpEF HTN Plan BNP elevated but downtrending, lasix x1 today Continue telemetry monitoring And continue aspirin, atorvastatin, and Lopressor  History of urinary retention and recurrent UTIs Plan Continue Urecholine and Flomax  Hypokalemia - replace and repeat BMET  History of anxiety and depression Plan Holding alprazolam for now given altered state, continue Lexapro  Prn versed, may consider adding back enteral  benzos depending on duration of mechanical ventilation  chronic back pain Plan Treat as needed  Best Practice (right click and "Reselect all SmartList Selections" daily)   Diet/type: NPO w/ oral meds DVT prophylaxis: prophylactic heparin  GI prophylaxis: PPI Lines: N/A Foley:  N/A Code Status:  full code Last date of multidisciplinary goals of care discussion [updated sister dianne barley on 11/18 she will update her daughter. [name] daughter had a stroke and is not able to communicate.  Full code.]  Critical care time:      The patient is critically ill due to respiratory failure.  Critical care was necessary to treat or prevent imminent or life-threatening deterioration.  Critical care was time spent personally by me on the following activities: development of treatment plan with patient and/or surrogate as well as nursing, discussions with consultants, evaluation of patient's response to treatment, examination of patient, obtaining history from patient or surrogate, ordering and performing treatments and  interventions, ordering and review of laboratory studies, ordering and review of radiographic studies, pulse oximetry, re-evaluation of patient's condition and participation in multidisciplinary rounds.   Critical Care Time devoted to patient care services described in this note is 35 minutes. This time reflects time of care of this signee Charlott Holler . This critical care time does not reflect separately billable procedures or procedure time, teaching time or supervisory time of PA/NP/Med student/Med Resident etc but could involve care discussion time.       Charlott Holler [place] [facility] 11/14/2023 11:33 AM  Pager: see AMION  If no response to pager , please call critical care on call (see AMION) until 7pm After 7:00 pm call Elink

## 2023-11-15 DIAGNOSIS — J9621 Acute and chronic respiratory failure with hypoxia: Secondary | ICD-10-CM | POA: Diagnosis not present

## 2023-11-15 DIAGNOSIS — J9622 Acute and chronic respiratory failure with hypercapnia: Secondary | ICD-10-CM | POA: Diagnosis not present

## 2023-11-15 LAB — CBC
HCT: 35.7 % — ABNORMAL LOW (ref 36.0–46.0)
Hemoglobin: 10.6 g/dL — ABNORMAL LOW (ref 12.0–15.0)
MCH: 28.7 pg (ref 26.0–34.0)
MCHC: 29.7 g/dL — ABNORMAL LOW (ref 30.0–36.0)
MCV: 96.7 fL (ref 80.0–100.0)
Platelets: 171 10*3/uL (ref 150–400)
RBC: 3.69 MIL/uL — ABNORMAL LOW (ref 3.87–5.11)
RDW: 13.4 % (ref 11.5–15.5)
WBC: 16.3 10*3/uL — ABNORMAL HIGH (ref 4.0–10.5)
nRBC: 0 % (ref 0.0–0.2)

## 2023-11-15 LAB — BASIC METABOLIC PANEL
Anion gap: 10 (ref 5–15)
BUN: 19 mg/dL (ref 8–23)
CO2: 38 mmol/L — ABNORMAL HIGH (ref 22–32)
Calcium: 8.6 mg/dL — ABNORMAL LOW (ref 8.9–10.3)
Chloride: 88 mmol/L — ABNORMAL LOW (ref 98–111)
Creatinine, Ser: 0.57 mg/dL (ref 0.44–1.00)
GFR, Estimated: >60 mL/min (ref >60)
Glucose, Bld: 89 mg/dL (ref 70–99)
Potassium: 4 mmol/L (ref 3.5–5.1)
Sodium: 136 mmol/L (ref 135–145)

## 2023-11-15 LAB — CULTURE, RESPIRATORY W GRAM STAIN
Culture: NORMAL
Gram Stain: NONE SEEN

## 2023-11-15 LAB — GLUCOSE, CAPILLARY
Glucose-Capillary: 88 mg/dL (ref 70–99)
Glucose-Capillary: 135 mg/dL — ABNORMAL HIGH (ref 70–99)
Glucose-Capillary: 137 mg/dL — ABNORMAL HIGH (ref 70–99)
Glucose-Capillary: 101 mg/dL — ABNORMAL HIGH (ref 70–99)
Glucose-Capillary: 116 mg/dL — ABNORMAL HIGH (ref 70–99)

## 2023-11-15 LAB — PROCALCITONIN: Procalcitonin: <0.1 ng/mL

## 2023-11-15 LAB — BRAIN NATRIURETIC PEPTIDE: B Natriuretic Peptide: 836.2 pg/mL — ABNORMAL HIGH (ref 0.0–100.0)

## 2023-11-15 MED ORDER — ACETAZOLAMIDE SODIUM 500 MG IJ SOLR
500.0000 mg | Freq: Four times a day (QID) | INTRAMUSCULAR | Status: AC
  Administered 2023-11-15 (×2): 500 mg via INTRAVENOUS
  Filled 2023-11-15 (×2): qty 500

## 2023-11-15 MED ORDER — PANTOPRAZOLE SODIUM 40 MG PO TBEC
40.0000 mg | DELAYED_RELEASE_TABLET | Freq: Every day | ORAL | Status: DC
  Administered 2023-11-15 – 2023-11-16 (×2): 40 mg via ORAL
  Filled 2023-11-15 (×2): qty 1

## 2023-11-15 MED ORDER — IPRATROPIUM-ALBUTEROL 0.5-2.5 (3) MG/3ML IN SOLN
3.0000 mL | Freq: Three times a day (TID) | RESPIRATORY_TRACT | Status: DC
Start: 2023-11-15 — End: 2023-11-17
  Administered 2023-11-15 – 2023-11-17 (×7): 3 mL via RESPIRATORY_TRACT
  Filled 2023-11-15 (×8): qty 3

## 2023-11-15 MED ORDER — PREDNISONE 20 MG PO TABS
40.0000 mg | ORAL_TABLET | Freq: Every day | ORAL | Status: AC
  Administered 2023-11-16: 40 mg via ORAL
  Filled 2023-11-15: qty 2

## 2023-11-15 MED ORDER — FUROSEMIDE 10 MG/ML IJ SOLN
40.0000 mg | Freq: Two times a day (BID) | INTRAMUSCULAR | Status: AC
  Administered 2023-11-15 (×2): 40 mg via INTRAVENOUS
  Filled 2023-11-15 (×2): qty 4

## 2023-11-15 MED ORDER — STERILE WATER FOR INJECTION IJ SOLN
INTRAMUSCULAR | Status: AC
  Filled 2023-11-15: qty 10

## 2023-11-15 MED ORDER — HYDROCODONE-ACETAMINOPHEN 5-325 MG PO TABS
1.0000 | ORAL_TABLET | Freq: Four times a day (QID) | ORAL | Status: DC | PRN
  Administered 2023-11-15 – 2023-11-17 (×7): 1 via ORAL
  Filled 2023-11-15 (×7): qty 1

## 2023-11-15 MED ORDER — OXYCODONE HCL 5 MG PO TABS: 5.0000 mg | ORAL_TABLET | Freq: Four times a day (QID) | ORAL | Status: DC | PRN

## 2023-11-15 MED ORDER — HYDROCHLOROTHIAZIDE 12.5 MG PO TABS
12.5000 mg | ORAL_TABLET | Freq: Every day | ORAL | Status: DC
  Administered 2023-11-15 – 2023-11-17 (×3): 12.5 mg via ORAL
  Filled 2023-11-15 (×3): qty 1

## 2023-11-15 NOTE — Progress Notes (Signed)
NAME:  [name], MRN:  [id number], DOB:  07/20/1951, LOS: 3 ADMISSION DATE:  11/12/2023, CONSULTATION DATE: 11/17 REFERRING MD: Jeraldine Loots, CHIEF COMPLAINT: Acute on chronic hypoxic and hypercarbic respiratory failure  History of Present Illness:  72 year old female patient followed by Dr. Sherene Sires for chronic respiratory failure in the setting of Gold 4 COPD typically maintained on 2 L nasal cannula With baseline exertional dyspnea noted last seen as a hospital follow-up on August 14, 2023 following hospitalization for sepsis from urinary tract infection, complicated further by exacerbation of her underlying lung disease, just discharged once again on 10/22 after being hospitalized for acute hypercarbic respiratory failure, encephalopathy, due to decompensated diastolic heart failure +/- pneumonia treated with antibiotics, nebulized therapy, and systemic steroids and improved. On 11/12 patient called our office reporting 2 to 3-day history of worsening shortness of breath denied cough.  Did endorse shortness of breath with minimal exertion.  Pulse oximetry at rest 78% while on telephone and oxygen titrated to 3.5 L.  It was recommended that she be seen in the emergency room, the patient declined as per last ER visit resulted in several hours in the waiting room.  On 11/17 family called EMS as the patient was found to be less responsive, would only respond to sternal rub pulse oximetry 70% on 3 L.  Minimally arousable on presentation to the ER.  Placed on BiPAP.  Started on IV antibiotics and systemic steroids.  Initial blood gas notable for pH 7.18 pCO2 greater than 110.  Following placement of BiPAP patient did become a little more interactive and confused, pulling at BiPAP mask for this she also received small dose of Ativan.  Because of her acute on chronic respiratory failure pulmonary asked to admit.  Pertinent  Medical History  Chronic respiratory failure on home oxygen 2 L, Gold 4 COPD diastolic  heart failure, hypertension , Chronic back pain, urinary retention, prior pyelonephritis and sepsis, normocytic anemia, left lower lobe pulmonary nodule, obesity  Significant Hospital Events: Including procedures, antibiotic start and stop dates in addition to other pertinent events   11/17 admitted with acute on chronic hypoxic and hypercarbic respiratory failure and associated  hypercarbic encephalopathy started on broad-spectrum antibiotics, systemic steroids, and scheduled bronchodilators in addition to noninvasive positive pressure ventilation  Interim History / Subjective:  Patient was successfully extubated yesterday, remained on 2 L nasal cannula oxygen which is chronic She wore BiPAP for few hours overnight No overnight issues  Objective   Blood pressure (!) 164/71, pulse 100, temperature 98.5 F (36.9 C), temperature source Oral, resp. rate 15, height 5\' 3"  (1.6 m), weight 79.2 kg, SpO2 93%.    Vent Mode: PSV;BIPAP FiO2 (%):  [30 %-40 %] 32 % Set Rate:  [12 bmp] 12 bmp Vt Set:  [410 mL] 410 mL PEEP:  [5 cmH20] 5 cmH20 Pressure Support:  [8 cmH20-13 cmH20] 8 cmH20 Plateau Pressure:  [19 cmH20] 19 cmH20   Intake/Output Summary (Last 24 hours) at 11/15/2023 0940 Last data filed at 11/15/2023 0530 Gross per 24 hour  Intake 652.37 ml  Output 2575 ml  Net -1922.63 ml   Filed Weights   11/13/23 0500 11/14/23 0500 11/15/23 0700  Weight: 84.6 kg 79.3 kg 79.2 kg    Examination: General: Chronically ill-appearing elderly female, sitting on recliner HEENT: Bray/AT, eyes anicteric.  moist mucus membranes Neuro: Alert, awake following commands Chest: Coarse breath sounds, no wheezes or rhonchi Heart: Regular rate and rhythm, no murmurs or gallops Abdomen: Soft, nontender, nondistended, bowel  sounds present Skin: No rash  Labs and images reviewed  Resolved Hospital Problem list     Assessment & Plan:  Acute on chronic hypoxic and hypercarbic respiratory failure secondary to  acute exacerbation of chronic obstructive pulmonary disease Community-acquired pneumonia was ruled out Patient was successfully extubated yesterday Continue nasal cannula oxygen with O2 sat goal 88-92% She should wear BiPAP at night every night Continue steroid p.o. to complete 5 days therapy Should try to get her home bipap at discharge  Acute metabolic/hypercarbic encephalopathy  Resolved, patient is alert, awake and following commands  Acute on chronic HFpEF HTN Continue diuretics with Lasix and acetazolamide twice daily Continue telemetry monitoring Continue aspirin, atorvastatin, and Lopressor  Recurrent UTIs Continue Urecholine and Flomax  Hypokalemia, resolved  Anxiety and depression Chronic pain issues Hold alprazolam Continue Lexapro Restarted back on Norco 5 mg every 6 hours, will try to avoid increasing dose as she retains pCO2  Best Practice (right click and "Reselect all SmartList Selections" daily)   Diet/type: Regular consistency DVT prophylaxis: prophylactic heparin  GI prophylaxis: PPI Lines: N/A Foley:  N/A Code Status:  full code Last date of multidisciplinary goals of care discussion [updated sister dianne barley on 11/18 she will update her daughter. [name] daughter had a stroke and is not able to communicate.  Full code.]    Cheri Fowler, MD [place] [facility] See Amion for pager If no response to pager, please call [phone] until 7pm After 7pm, Please call E-link [phone]

## 2023-11-15 NOTE — Progress Notes (Signed)
   11/15/23 1948  Vent Select  Invasive or Noninvasive Noninvasive  Adult Vent Y  Adult Ventilator Settings  Vent Type Servo i  Humidity HME  Vent Mode BIPAP;PCV  Set Rate 15 bmp  FiO2 (%) 40 %  Pressure Control 6 cmH20  PEEP 5 cmH20  Adult Ventilator Measurements  Peak Airway Pressure 156 L/min  Mean Airway Pressure 11 cmH20  Resp Rate Spontaneous 7 br/min  Resp Rate Total 22 br/min  Exhaled Vt 896 mL  Measured Ve 10.6 L  I:E Ratio Measured 1:3.4  Total PEEP 5 cmH20  Adult Ventilator Alarms  Alarms On Y  Ve High Alarm 21 L/min  Ve Low Alarm 3 L/min  Resp Rate High Alarm 35 br/min  Resp Rate Low Alarm 5  PEEP Low Alarm 2 cmH2O  Press High Alarm 22 cmH2O

## 2023-11-15 NOTE — TOC Progression Note (Addendum)
Transition of Care [facility]) - Progression Note    Patient Details  Name: [name] MRN: [id number] Date of Birth: 1951/08/27  Transition of Care [facility] CM/SW Contact  Tom-Johnson, [name], RN Phone Number: 11/15/2023, 2:01 PM  Clinical Narrative:     Patient extubated yesterday 11/14/23 on BIPAP, currently on 3L O2 chronic. Uses 3L O2 at home from [employer]. Completed oral abx, on Oral Steroid, IV Diuretic and IV Heparin.  CM spoke with patient at bedside about post hospital transition. From home with daughter, has a supportive sister. Patient does not drive, daughter transports to and from appointments. Has a cane, rollator, shower seat and grab bars at home.  PCP is Benita Stabile, MD and use [facility].  Awaiting PT eval for disposition. Was active with [facility] and would like to use their services again if  recommended.  Patient not Medically ready for discharge.  CM will continue to follow as patient progresses with care towards discharge.            Expected Discharge Plan and Services                                               Social Determinants of Health (SDOH) Interventions SDOH Screenings   Food Insecurity: No Food Insecurity (11/15/2023)  Housing: Low Risk  (11/15/2023)  Transportation Needs: No Transportation Needs (11/15/2023)  Utilities: Not At Risk (11/15/2023)  Tobacco Use: Medium Risk (11/14/2023)    Readmission Risk Interventions    07/12/2023   12:24 PM  Readmission Risk Prevention Plan  Transportation Screening Complete  PCP or Specialist Appt within 5-7 Days Complete  Home Care Screening Complete  Medication Review (RN CM) Complete

## 2023-11-15 NOTE — Plan of Care (Signed)
  Problem: Education: Goal: Ability to describe self-care measures that may prevent or decrease complications (Diabetes Survival Skills Education) will improve Outcome: Progressing   Problem: Coping: Goal: Ability to adjust to condition or change in health will improve Outcome: Progressing   Problem: Fluid Volume: Goal: Ability to maintain a balanced intake and output will improve Outcome: Progressing   Problem: Health Behavior/Discharge Planning: Goal: Ability to identify and utilize available resources and services will improve Outcome: Progressing Goal: Ability to manage health-related needs will improve Outcome: Progressing   Problem: Metabolic: Goal: Ability to maintain appropriate glucose levels will improve Outcome: Progressing   Problem: Nutritional: Goal: Maintenance of adequate nutrition will improve Outcome: Progressing Goal: Progress toward achieving an optimal weight will improve Outcome: Progressing   Problem: Skin Integrity: Goal: Risk for impaired skin integrity will decrease Outcome: Progressing   Problem: Tissue Perfusion: Goal: Adequacy of tissue perfusion will improve Outcome: Progressing   Problem: Education: Goal: Knowledge of General Education information will improve Description: Including pain rating scale, medication(s)/side effects and non-pharmacologic comfort measures Outcome: Progressing   Problem: Health Behavior/Discharge Planning: Goal: Ability to manage health-related needs will improve Outcome: Progressing   Problem: Clinical Measurements: Goal: Ability to maintain clinical measurements within normal limits will improve Outcome: Progressing Goal: Will remain free from infection Outcome: Progressing Goal: Diagnostic test results will improve Outcome: Progressing Goal: Respiratory complications will improve Outcome: Progressing Goal: Cardiovascular complication will be avoided Outcome: Progressing   Problem: Activity: Goal:  Risk for activity intolerance will decrease Outcome: Progressing   Problem: Nutrition: Goal: Adequate nutrition will be maintained Outcome: Progressing   Problem: Coping: Goal: Level of anxiety will decrease Outcome: Progressing   Problem: Elimination: Goal: Will not experience complications related to bowel motility Outcome: Progressing Goal: Will not experience complications related to urinary retention Outcome: Progressing   Problem: Pain Management: Goal: General experience of comfort will improve Outcome: Progressing   Problem: Safety: Goal: Ability to remain free from injury will improve Outcome: Progressing   Problem: Skin Integrity: Goal: Risk for impaired skin integrity will decrease Outcome: Progressing   Problem: Activity: Goal: Ability to tolerate increased activity will improve Outcome: Progressing   Problem: Respiratory: Goal: Ability to maintain a clear airway and adequate ventilation will improve Outcome: Progressing   Problem: Role Relationship: Goal: Method of communication will improve Outcome: Progressing

## 2023-11-15 NOTE — Evaluation (Signed)
Physical Therapy Evaluation Patient Details Name: [name] MRN: [id number] DOB: 15-Aug-1951 Today's Date: 11/15/2023  History of Present Illness  72 y.o. female presents to [facility] hospital on 11/12/2023 with hypoxemia and AMS. Pt required intubation on 11/17, extubated 11/19. PMHx: COPD, CHF, bronchiectasis, pulm nodules, nicotine addiction, heart murmur, pyelonephritis, e coli, bacteremia, HTN.  Clinical Impression  Pt received in chair with 4L [place] upon PT session and agreeable to therapy. The pt reports to PT with deficits in strength, gait, and activity tolerance. The pt was able to ambulate a short distance in the room with the use of a RW and CGA. PT observed pt work of breathing increase with mobility. The pt oxygen desaturated to the mid 80s during ambulation; however, the pt recovered quickly with rest. Pt required verbal cues for proper hand placement on the RW.  The pt will continue to benefit from skilled PT to address remaining functional deficits. PT recommends HHPT following hospitalization to continue to work on problem list below.       If plan is discharge home, recommend the following: A little help with walking and/or transfers;A little help with bathing/dressing/bathroom;Assistance with cooking/housework;Assist for transportation   Can travel by private vehicle        Equipment Recommendations None recommended by PT  Recommendations for Other Services       Functional Status Assessment Patient has had a recent decline in their functional status and demonstrates the ability to make significant improvements in function in a reasonable and predictable amount of time.     Precautions / Restrictions Precautions Precautions: Fall Restrictions Weight Bearing Restrictions: No      Mobility  Bed Mobility               General bed mobility comments: Pt in recliner upon PT arrival    Transfers Overall transfer level: Needs assistance Equipment used: Rolling  walker (2 wheels) Transfers: Sit to/from Stand Sit to Stand: Contact guard assist                Ambulation/Gait Ambulation/Gait assistance: Contact guard assist Gait Distance (Feet): 15 Feet (total of 3 trials inside room.) Assistive device: Rolling walker (2 wheels) Gait Pattern/deviations: Step-to pattern, Decreased stride length, Trunk flexed Gait velocity: slow Gait velocity interpretation: <1.31 ft/sec, indicative of household Public librarian Bed    Modified Rankin (Stroke Patients Only)       Balance Overall balance assessment: Needs assistance Sitting-balance support: Feet supported Sitting balance-Leahy Scale: Good     Standing balance support: Bilateral upper extremity supported, During functional activity, Reliant on assistive device for balance Standing balance-Leahy Scale: Poor                               Pertinent Vitals/Pain Pain Assessment Pain Assessment: No/denies pain    Home Living Family/patient expects to be discharged to:: Private residence Living Arrangements: Children Available Help at Discharge: Family;Available 24 hours/day Type of Home: House Home Access: Stairs to enter Entrance Stairs-Rails: Right;Left;Can reach both Entrance Stairs-Number of Steps: 2   Home Layout: One level Home Equipment: Shower seat;Grab bars - tub/shower;Rolling Walker (2 wheels);Rollator (4 wheels);Gilmer Mor - single point Additional Comments: Lives with daughter    Prior Function Prior Level of Function : Needs assist  Mobility Comments: walk with no AD prior to hospitalization. ADLs Comments: Assist with transportation     Extremity/Trunk Assessment   Upper Extremity Assessment Upper Extremity Assessment: Generalized weakness    Lower Extremity Assessment Lower Extremity Assessment: Generalized weakness    Cervical / Trunk Assessment Cervical / Trunk  Assessment: Normal  Communication   Communication Communication: No apparent difficulties Following commands: Follows one step commands consistently Cueing Techniques: Verbal cues;Tactile cues;Visual cues  Cognition Arousal: Alert Behavior During Therapy: Anxious Overall Cognitive Status: Within Functional Limits for tasks assessed                                          General Comments General comments (skin integrity, edema, etc.): Pt desaturates to mid 80s during mobility, but pt recovers quickly with rest.    Exercises     Assessment/Plan    PT Assessment Patient needs continued PT services  PT Problem List Decreased strength;Decreased activity tolerance;Decreased balance;Decreased mobility;Decreased coordination       PT Treatment Interventions Gait training;Stair training;Functional mobility training;Therapeutic activities;Therapeutic exercise;DME instruction;Balance training;Neuromuscular re-education    PT Goals (Current goals can be found in the Care Plan section)  Acute Rehab PT Goals Patient Stated Goal: to return home PT Goal Formulation: With patient Time For Goal Achievement: 11/29/23 Potential to Achieve Goals: Good    Frequency Min 1X/week     Co-evaluation               AM-PAC PT "6 Clicks" Mobility  Outcome Measure Help needed turning from your back to your side while in a flat bed without using bedrails?: None Help needed moving from lying on your back to sitting on the side of a flat bed without using bedrails?: A Little Help needed moving to and from a bed to a chair (including a wheelchair)?: A Little Help needed standing up from a chair using your arms (e.g., wheelchair or bedside chair)?: A Little Help needed to walk in hospital room?: A Little Help needed climbing 3-5 steps with a railing? : A Little 6 Click Score: 19    End of Session Equipment Utilized During Treatment: Gait belt;Oxygen Activity Tolerance: Patient  limited by fatigue Patient left: in chair;with call bell/phone within reach;with chair alarm set Nurse Communication: Mobility status PT Visit Diagnosis: Unsteadiness on feet (R26.81);History of falling (Z91.81);Muscle weakness (generalized) (M62.81)    Time: 1914-7829 PT Time Calculation (min) (ACUTE ONLY): 36 min   Charges:   PT Evaluation $PT Eval Moderate Complexity: 1 Mod PT Treatments $Gait Training: 8-22 mins PT General Charges $$ ACUTE PT VISIT: 1 Visit        Caryl Comes, SPT Acute Rehabilitation Office Phone [phone]   Caryl Comes 11/15/2023, 3:20 PM

## 2023-11-16 DIAGNOSIS — J9620 Acute and chronic respiratory failure, unspecified whether with hypoxia or hypercapnia: Secondary | ICD-10-CM | POA: Diagnosis not present

## 2023-11-16 LAB — BASIC METABOLIC PANEL
Anion gap: 9 (ref 5–15)
BUN: 22 mg/dL (ref 8–23)
CO2: 41 mmol/L — ABNORMAL HIGH (ref 22–32)
Calcium: 9.1 mg/dL (ref 8.9–10.3)
Chloride: 86 mmol/L — ABNORMAL LOW (ref 98–111)
Creatinine, Ser: 0.84 mg/dL (ref 0.44–1.00)
GFR, Estimated: >60 mL/min (ref >60)
Glucose, Bld: 112 mg/dL — ABNORMAL HIGH (ref 70–99)
Potassium: 3.5 mmol/L (ref 3.5–5.1)
Sodium: 136 mmol/L (ref 135–145)

## 2023-11-16 LAB — GLUCOSE, CAPILLARY
Glucose-Capillary: 107 mg/dL — ABNORMAL HIGH (ref 70–99)
Glucose-Capillary: 114 mg/dL — ABNORMAL HIGH (ref 70–99)
Glucose-Capillary: 126 mg/dL — ABNORMAL HIGH (ref 70–99)
Glucose-Capillary: 129 mg/dL — ABNORMAL HIGH (ref 70–99)
Glucose-Capillary: 137 mg/dL — ABNORMAL HIGH (ref 70–99)
Glucose-Capillary: 163 mg/dL — ABNORMAL HIGH (ref 70–99)
Glucose-Capillary: 87 mg/dL (ref 70–99)

## 2023-11-16 LAB — BRAIN NATRIURETIC PEPTIDE: B Natriuretic Peptide: 548.6 pg/mL — ABNORMAL HIGH (ref 0.0–100.0)

## 2023-11-16 MED ORDER — FUROSEMIDE 20 MG PO TABS
20.0000 mg | ORAL_TABLET | Freq: Every day | ORAL | Status: DC
  Administered 2023-11-16 – 2023-11-17 (×2): 20 mg via ORAL
  Filled 2023-11-16 (×2): qty 1

## 2023-11-16 MED ORDER — ACETAMINOPHEN 325 MG PO TABS
650.0000 mg | ORAL_TABLET | Freq: Four times a day (QID) | ORAL | Status: DC | PRN
  Administered 2023-11-17: 650 mg via ORAL
  Filled 2023-11-16: qty 2

## 2023-11-16 NOTE — Progress Notes (Signed)
PROGRESS NOTE    [name]  GHW:[id number] DOB: 09-03-1951 DOA: 11/12/2023 PCP: Benita Stabile, MD   Brief Narrative:  72 year old female patient followed by Dr. Sherene Sires for chronic respiratory failure in the setting of Gold 4 COPD typically maintained on 2 L nasal cannula With baseline exertional dyspnea noted last seen as a hospital follow-up on August 14, 2023 following hospitalization for sepsis from urinary tract infection, complicated further by exacerbation of her underlying lung disease, just discharged once again on 10/22 after being hospitalized for acute hypercarbic respiratory failure/pneumonia/heart failure.  11/12 patient reported worsening respiratory symptoms in the office with notable hypoxia on baseline oxygen at 3.5 L.  Family called EMS on 11/17 patient was found to be poorly responsive and confused -admitted to ICU initially requiring intubation.   Assessment & Plan:   Principal Problem:   Acute on chronic respiratory failure (HCC)  Acute on chronic hypoxic and hypercarbic respiratory failure secondary to acute exacerbation of chronic obstructive pulmonary disease Community-acquired pneumonia ruled out Successfully extubated 11/19 Continue nasal cannula oxygen with O2 sat goal 88-92% Currently on home oxygen at 3.5 L, ambulatory walk screen pending Pulmonology team recommending BiPAP at night, TOC working on providing this equipment Steroid course completed Concurrent metabolic alkalosis/hypochloremia appear to be at baseline(consistent with labs over the past year)   Acute metabolic/hypercarbic encephalopathy  Resolved, patient is alert, awake and following commands   Chronic HFpEF without acute exacerbation HTN CAD Continue home medications including furosemide, aspirin, atorvastatin, and Lopressor   Recurrent UTIs Continue Urecholine and Flomax   Hypokalemia, resolved   Anxiety and depression Chronic pain issues Hold alprazolam Continue  Lexapro Restarted back on Norco 5 mg every 6 hours, will try to avoid increasing dose as she retains pCO2  DVT prophylaxis: heparin injection 5,000 Units Start: 11/12/23 1445   Code Status:   Code Status: Full Code  Family Communication: None present  Status is: Inpatient  Dispo: The patient is from: Home              Anticipated d/c is to: Home              Anticipated d/c date is: 24 to 48 hours              Patient currently not medically stable for discharge  Consultants:  PCCM  Procedures:  None  Antimicrobials:  None  Subjective: No acute issues or events overnight denies nausea vomiting diarrhea constipation headache fevers chills or chest pain  Objective: Vitals:   11/16/23 0400 11/16/23 0500 11/16/23 0600 11/16/23 0722  BP: 138/65 (!) 120/57 132/63   Pulse: 61 60 62   Resp: 15 12 16    Temp:    99 F (37.2 C)  TempSrc:    Oral  SpO2: 98% 97% 97%   Weight:      Height:        Intake/Output Summary (Last 24 hours) at 11/16/2023 0739 Last data filed at 11/16/2023 0600 Gross per 24 hour  Intake 960 ml  Output 4000 ml  Net -3040 ml   Filed Weights   11/13/23 0500 11/14/23 0500 11/15/23 0700  Weight: 84.6 kg 79.3 kg 79.2 kg    Examination:  General:  Pleasantly resting in bed, No acute distress. HEENT:  Normocephalic atraumatic.  Sclerae nonicteric, noninjected.  Extraocular movements intact bilaterally. Neck:  Without mass or deformity.  Trachea is midline. Lungs:  Clear to auscultate bilaterally without rhonchi, wheeze, or rales. Heart:  Regular rate and  rhythm.  Without murmurs, rubs, or gallops. Abdomen:  Soft, nontender, nondistended.  Without guarding or rebound. Extremities: Without cyanosis, clubbing, edema, or obvious deformity. Skin:  Warm and dry, no erythema.  Data Reviewed: I have personally reviewed following labs and imaging studies  CBC: Recent Labs  Lab 11/12/23 1215 11/12/23 1601 11/12/23 1748 11/13/23 0430 11/13/23 0818  11/13/23 1007 11/15/23 0315  WBC 10.7*  --  9.9  --   --  7.9 16.3*  NEUTROABS 8.3*  --   --   --   --   --   --   HGB 10.0*   < > 9.6* 10.5* 11.2* 10.0* 10.6*  HCT 37.1   < > 34.2* 31.0* 33.0* 33.3* 35.7*  MCV 106.9*  --  102.7*  --   --  98.2 96.7  PLT 156  --  149*  --   --  168 171   < > = values in this interval not displayed.   Basic Metabolic Panel: Recent Labs  Lab 11/12/23 1215 11/12/23 1601 11/12/23 1636 11/13/23 0430 11/13/23 0818 11/13/23 1007 11/14/23 0349 11/15/23 0315 11/16/23 0249  NA 141   < >  --    < > 135 138 137 136 136  K 4.8   < >  --    < > 2.6* 2.8* 4.2 4.0 3.5  CL 90*  --   --   --   --  85* 89* 88* 86*  CO2 >45*  --   --   --   --  43* 37* 38* 41*  GLUCOSE 134*  --   --   --   --  164* 99 89 112*  BUN 15  --   --   --   --  15 18 19 22   CREATININE 0.52  --  0.71  --   --  0.74 0.59 0.57 0.84  CALCIUM 8.7*  --   --   --   --  9.0 8.6* 8.6* 9.1  MG  --   --   --   --   --  1.8  --   --   --    < > = values in this interval not displayed.   GFR: Estimated Creatinine Clearance: 62.1 mL/min (by C-G formula based on SCr of 0.84 mg/dL). Liver Function Tests: Recent Labs  Lab 11/12/23 1215  AST 15  ALT 14  ALKPHOS 67  BILITOT 0.5  PROT 6.3*  ALBUMIN 3.6   No results for input(s): "LIPASE", "AMYLASE" in the last 168 hours. No results for input(s): "AMMONIA" in the last 168 hours. Coagulation Profile: No results for input(s): "INR", "PROTIME" in the last 168 hours. Cardiac Enzymes: No results for input(s): "CKTOTAL", "CKMB", "CKMBINDEX", "TROPONINI" in the last 168 hours. BNP (last 3 results) No results for input(s): "PROBNP" in the last 8760 hours. HbA1C: No results for input(s): "HGBA1C" in the last 72 hours. CBG: Recent Labs  Lab 11/15/23 1557 11/15/23 1956 11/16/23 0051 11/16/23 0339 11/16/23 0720  GLUCAP 101* 116* 126* 114* 107*   Lipid Profile: Recent Labs    11/13/23 1007  TRIG 151*   Thyroid Function Tests: No results  for input(s): "TSH", "T4TOTAL", "FREET4", "T3FREE", "THYROIDAB" in the last 72 hours. Anemia Panel: No results for input(s): "VITAMINB12", "FOLATE", "FERRITIN", "TIBC", "IRON", "RETICCTPCT" in the last 72 hours. Sepsis Labs: Recent Labs  Lab 11/12/23 1636 11/13/23 1007 11/14/23 0349 11/15/23 0315  PROCALCITON <0.10 0.10 <0.10 <0.10    Recent Results (from  the past 240 hour(s))  Resp panel by RT-PCR (RSV, Flu A&B, Covid) Anterior Nasal Swab     Status: None   Collection Time: 11/12/23 12:13 PM   Specimen: Anterior Nasal Swab  Result Value Ref Range Status   SARS Coronavirus 2 by RT PCR NEGATIVE NEGATIVE Final   Influenza A by PCR NEGATIVE NEGATIVE Final   Influenza B by PCR NEGATIVE NEGATIVE Final    Comment: (NOTE) The Xpert Xpress SARS-CoV-2/FLU/RSV plus assay is intended as an aid in the diagnosis of influenza from Nasopharyngeal swab specimens and should not be used as a sole basis for treatment. Nasal washings and aspirates are unacceptable for Xpert Xpress SARS-CoV-2/FLU/RSV testing.  Fact Sheet for Patients: BloggerCourse.com  Fact Sheet for Healthcare Providers: SeriousBroker.it  This test is not yet approved or cleared by the [place] FDA and has been authorized for detection and/or diagnosis of SARS-CoV-2 by FDA under an Emergency Use Authorization (EUA). This EUA will remain in effect (meaning this test can be used) for the duration of the COVID-19 declaration under Section 564(b)(1) of the Act, 21 U.S.C. section 360bbb-3(b)(1), unless the authorization is terminated or revoked.     Resp Syncytial Virus by PCR NEGATIVE NEGATIVE Final    Comment: (NOTE) Fact Sheet for Patients: BloggerCourse.com  Fact Sheet for Healthcare Providers: SeriousBroker.it  This test is not yet approved or cleared by the [place] FDA and has been authorized for detection  and/or diagnosis of SARS-CoV-2 by FDA under an Emergency Use Authorization (EUA). This EUA will remain in effect (meaning this test can be used) for the duration of the COVID-19 declaration under Section 564(b)(1) of the Act, 21 U.S.C. section 360bbb-3(b)(1), unless the authorization is terminated or revoked.  Performed at [facility], [address], [place], [place] [address]   MRSA Next Gen by PCR, Nasal     Status: None   Collection Time: 11/12/23  3:36 PM   Specimen: Nasal Mucosa; Nasal Swab  Result Value Ref Range Status   MRSA by PCR Next Gen NOT DETECTED NOT DETECTED Final    Comment: (NOTE) The GeneXpert MRSA Assay (FDA approved for NASAL specimens only), is one component of a comprehensive MRSA colonization surveillance program. It is not intended to diagnose MRSA infection nor to guide or monitor treatment for MRSA infections. Test performance is not FDA approved in patients less than 4 years old. Performed at [facility], [address], [place], [place] [address]   Respiratory (~20 pathogens) panel by PCR     Status: None   Collection Time: 11/12/23  4:04 PM   Specimen: Nasopharyngeal Swab; Respiratory  Result Value Ref Range Status   Adenovirus NOT DETECTED NOT DETECTED Final   Coronavirus 229E NOT DETECTED NOT DETECTED Final    Comment: (NOTE) The Coronavirus on the Respiratory Panel, DOES NOT test for the novel  Coronavirus (2019 nCoV)    Coronavirus HKU1 NOT DETECTED NOT DETECTED Final   Coronavirus NL63 NOT DETECTED NOT DETECTED Final   Coronavirus OC43 NOT DETECTED NOT DETECTED Final   Metapneumovirus NOT DETECTED NOT DETECTED Final   Rhinovirus / Enterovirus NOT DETECTED NOT DETECTED Final   Influenza A NOT DETECTED NOT DETECTED Final   Influenza B NOT DETECTED NOT DETECTED Final   Parainfluenza Virus 1 NOT DETECTED NOT DETECTED Final   Parainfluenza Virus 2 NOT DETECTED NOT DETECTED Final   Parainfluenza Virus 3 NOT DETECTED NOT DETECTED  Final   Parainfluenza Virus 4 NOT DETECTED NOT  DETECTED Final   Respiratory Syncytial Virus NOT DETECTED NOT DETECTED Final   Bordetella pertussis NOT DETECTED NOT DETECTED Final   Bordetella Parapertussis NOT DETECTED NOT DETECTED Final   Chlamydophila pneumoniae NOT DETECTED NOT DETECTED Final   Mycoplasma pneumoniae NOT DETECTED NOT DETECTED Final    Comment: Performed at [facility], [address] [place], [place] [address]  Culture, Respiratory w Gram Stain     Status: None   Collection Time: 11/12/23  4:39 PM   Specimen: Tracheal Aspirate; Respiratory  Result Value Ref Range Status   Specimen Description TRACHEAL ASPIRATE  Final   Special Requests NONE  Final   Gram Stain NO WBC SEEN NO ORGANISMS SEEN   Final   Culture   Final    Normal respiratory flora-no Staph aureus or Pseudomonas seen Performed at [facility], [address], [place], [place] [address]    Report Status 11/15/2023 FINAL  Final         Radiology Studies: No results found.      Scheduled Meds:  atorvastatin  80 mg Oral Daily   bethanechol  20 mg Oral Daily   budesonide  0.25 mg Nebulization BID   Chlorhexidine Gluconate Cloth  6 each Topical Q0600   escitalopram  5 mg Oral Daily   heparin  5,000 Units Subcutaneous Q8H   hydrochlorothiazide  12.5 mg Oral Daily   insulin aspart  0-15 Units Subcutaneous Q4H   ipratropium-albuterol  3 mL Nebulization TID   metoprolol tartrate  25 mg Oral BID   pantoprazole  40 mg Oral QHS   predniSONE  40 mg Oral Q breakfast   Continuous Infusions:   LOS: 4 days   Time spent:  Azucena Fallen, DO [facility]  If 7PM-7AM, please contact night-coverage [email or web address]  11/16/2023, 7:39 AM

## 2023-11-16 NOTE — TOC Progression Note (Addendum)
Transition of Care [facility]) - Progression Note    Patient Details  Name: [name] MRN: [id number] Date of Birth: 04/11/1951  [facility] CM/SW Contact  Tom-Johnson, [name], RN Phone Number: 11/16/2023, 12:27 PM  Clinical Narrative:     Home health recommended, patient active with [facility]. Orders placed and info on AVS.  CM consulted for Home BIPAP. CM called in order to [employer], patient already receives home O2 from [employer]. Marthann Schiller states there is no Sleep Study recorded and last ABG result too high for BIPAP. Patient qualifies for a NIV and will process for insurance approval. MD notified.    CM will continue to follow as patient progresses with care towards discharge.        Expected Discharge Plan and Services                                               Social Determinants of Health (SDOH) Interventions SDOH Screenings   Food Insecurity: No Food Insecurity (11/15/2023)  Housing: Low Risk  (11/15/2023)  Transportation Needs: No Transportation Needs (11/15/2023)  Utilities: Not At Risk (11/15/2023)  Tobacco Use: Medium Risk (11/14/2023)    Readmission Risk Interventions    07/12/2023   12:24 PM  Readmission Risk Prevention Plan  Transportation Screening Complete  PCP or Specialist Appt within 5-7 Days Complete  Home Care Screening Complete  Medication Review (RN CM) Complete

## 2023-11-16 NOTE — Plan of Care (Signed)
  Problem: Fluid Volume: Goal: Ability to maintain a balanced intake and output will improve Outcome: Progressing   Problem: Metabolic: Goal: Ability to maintain appropriate glucose levels will improve Outcome: Progressing   Problem: Nutritional: Goal: Maintenance of adequate nutrition will improve Outcome: Progressing   Problem: Skin Integrity: Goal: Risk for impaired skin integrity will decrease Outcome: Progressing   Problem: Clinical Measurements: Goal: Respiratory complications will improve Outcome: Progressing Goal: Cardiovascular complication will be avoided Outcome: Progressing

## 2023-11-16 NOTE — Progress Notes (Signed)
   11/16/23 2320  BiPAP/CPAP/SIPAP  BiPAP/CPAP/SIPAP Pt Type Adult  BiPAP/CPAP/SIPAP SERVO  Mask Type Full face mask  Mask Size Small  Set Rate 15 breaths/min  Respiratory Rate 21 breaths/min  Pressure Support 6 cmH20 (PC)  PEEP 5 cmH20  FiO2 (%) 40 %  Minute Ventilation 9.7  Leak 5  Peak Inspiratory Pressure (PIP) 11  Tidal Volume (Vt) 755  Patient Home Equipment No  Press High Alarm 22 cmH2O

## 2023-11-17 DIAGNOSIS — J9621 Acute and chronic respiratory failure with hypoxia: Secondary | ICD-10-CM | POA: Diagnosis not present

## 2023-11-17 LAB — BASIC METABOLIC PANEL
Anion gap: 11 (ref 5–15)
BUN: 21 mg/dL (ref 8–23)
CO2: 36 mmol/L — ABNORMAL HIGH (ref 22–32)
Calcium: 9 mg/dL (ref 8.9–10.3)
Chloride: 85 mmol/L — ABNORMAL LOW (ref 98–111)
Creatinine, Ser: 0.72 mg/dL (ref 0.44–1.00)
GFR, Estimated: >60 mL/min (ref >60)
Glucose, Bld: 119 mg/dL — ABNORMAL HIGH (ref 70–99)
Potassium: 3.4 mmol/L — ABNORMAL LOW (ref 3.5–5.1)
Sodium: 132 mmol/L — ABNORMAL LOW (ref 135–145)

## 2023-11-17 LAB — GLUCOSE, CAPILLARY
Glucose-Capillary: 95 mg/dL (ref 70–99)
Glucose-Capillary: 145 mg/dL — ABNORMAL HIGH (ref 70–99)
Glucose-Capillary: 98 mg/dL (ref 70–99)

## 2023-11-17 MED ORDER — IPRATROPIUM-ALBUTEROL 0.5-2.5 (3) MG/3ML IN SOLN: 3.0000 mL | Freq: Two times a day (BID) | RESPIRATORY_TRACT | Status: DC | PRN

## 2023-11-17 MED ORDER — FUROSEMIDE 20 MG PO TABS: 20.0000 mg | ORAL_TABLET | Freq: Every day | ORAL | Status: DC

## 2023-11-17 MED ORDER — HYDROCHLOROTHIAZIDE 12.5 MG PO TABS: 12.5000 mg | ORAL_TABLET | Freq: Every day | ORAL | 0 refills | Status: DC

## 2023-11-17 NOTE — Progress Notes (Signed)
Physical Therapy Treatment Patient Details Name: [name] MRN: [id number] DOB: 1951-04-22 Today's Date: 11/17/2023   History of Present Illness 72 y.o. female presents to [facility] hospital on 11/12/2023 with hypoxemia and AMS. Pt required intubation on 11/17, extubated 11/19. PMHx: COPD, CHF, bronchiectasis, pulm nodules, nicotine addiction, heart murmur, pyelonephritis, e coli, bacteremia, HTN.    PT Comments  Pt received in chair with 4L [place] and agreeable to PT treatment. The pt was able to ambulate household distances with no AD and supervision from PT. The pt required a rest break during ambulation due to fatigue. PT recommends the pt use an AD when walking in the community due to unsteadiness on feet during ambulation. PT recommends the pt receive HHPT to address problem list below. The pt will continue to benefit from skilled PT to address remaining functional deficits.   If plan is discharge home, recommend the following: A little help with walking and/or transfers;A little help with bathing/dressing/bathroom;Assistance with cooking/housework;Assist for transportation   Can travel by private vehicle        Equipment Recommendations  None recommended by PT    Recommendations for Other Services       Precautions / Restrictions Precautions Precautions: Fall Restrictions Weight Bearing Restrictions: No     Mobility  Bed Mobility               General bed mobility comments: Pt received in chair upon session    Transfers Overall transfer level: Needs assistance Equipment used: None Transfers: Sit to/from Stand Sit to Stand: Contact guard assist                Ambulation/Gait Ambulation/Gait assistance: Supervision Gait Distance (Feet): 130 Feet (+80 after sitting rest break) Assistive device: None Gait Pattern/deviations: Step-through pattern, Decreased stride length Gait velocity: slow Gait velocity interpretation: <1.8 ft/sec, indicate of risk for  recurrent falls       Stairs             Wheelchair Mobility     Tilt Bed    Modified Rankin (Stroke Patients Only)       Balance Overall balance assessment: Needs assistance Sitting-balance support: Feet supported Sitting balance-Leahy Scale: Good     Standing balance support: No upper extremity supported, During functional activity Standing balance-Leahy Scale: Good                              Cognition Arousal: Alert Behavior During Therapy: Anxious Overall Cognitive Status: Within Functional Limits for tasks assessed                                          Exercises      General Comments General comments (skin integrity, edema, etc.): VSS on 4L [place]      Pertinent Vitals/Pain Pain Assessment Pain Assessment: No/denies pain    Home Living                          Prior Function            PT Goals (current goals can now be found in the care plan section) Acute Rehab PT Goals Patient Stated Goal: to return home PT Goal Formulation: With patient Time For Goal Achievement: 11/29/23 Potential to Achieve Goals: Good Progress towards PT goals: Progressing toward  goals    Frequency    Min 1X/week      PT Plan      Co-evaluation              AM-PAC PT "6 Clicks" Mobility   Outcome Measure  Help needed turning from your back to your side while in a flat bed without using bedrails?: None Help needed moving from lying on your back to sitting on the side of a flat bed without using bedrails?: A Little Help needed moving to and from a bed to a chair (including a wheelchair)?: A Little Help needed standing up from a chair using your arms (e.g., wheelchair or bedside chair)?: A Little Help needed to walk in hospital room?: A Little Help needed climbing 3-5 steps with a railing? : A Little 6 Click Score: 19    End of Session Equipment Utilized During Treatment: Gait belt;Oxygen Activity  Tolerance: Patient tolerated treatment well Patient left: in chair;with call bell/phone within reach;with chair alarm set Nurse Communication: Mobility status PT Visit Diagnosis: Unsteadiness on feet (R26.81);History of falling (Z91.81);Muscle weakness (generalized) (M62.81)     Time: 0102-7253 PT Time Calculation (min) (ACUTE ONLY): 16 min  Charges:    $Gait Training: 8-22 mins PT General Charges $$ ACUTE PT VISIT: 1 Visit                     Caryl Comes, SPT Acute Rehabilitation Office Phone [phone]    Caryl Comes 11/17/2023, 12:37 PM

## 2023-11-17 NOTE — Discharge Summary (Signed)
Physician Discharge Summary  [name] KGM:[id number] DOB: 12-Jun-1951 DOA: 11/12/2023  PCP: Benita Stabile, MD  Admit date: 11/12/2023 Discharge date: 11/17/2023  Admitted From: Home Disposition: Home  Recommendations for Outpatient Follow-up:  Follow up with PCP in 1-2 weeks Follow-up with pulmonology as scheduled  Home Health: None Equipment/Devices: BiPAP  Discharge Condition: Stable CODE STATUS: Full Diet recommendation: Low-salt low-fat low-carb diet  Brief/Interim Summary: 72 year old female patient followed by Dr. Sherene Sires for chronic respiratory failure in the setting of Gold 4 COPD typically maintained on 2 L nasal cannula With baseline exertional dyspnea noted last seen as a hospital follow-up on August 14, 2023 following hospitalization for sepsis from urinary tract infection, complicated further by exacerbation of her underlying lung disease, just discharged once again on 10/22 after being hospitalized for acute hypercarbic respiratory failure/pneumonia/heart failure.  11/12 patient reported worsening respiratory symptoms in the office with notable hypoxia on baseline oxygen at 3.5 L.  Family called EMS on 11/17 patient was found to be poorly responsive and confused -admitted to ICU initially requiring intubation.  Patient admitted as above with acute respiratory failure requiring intubation due to COPD exacerbation.  Pneumonia has been ruled out at this time.  Patient improved drastically with supportive care, weaned off ventilator on the 19th and has been back to baseline oxygen needs for the past 24 hours.  She is otherwise stable and agreeable for discharge home, close follow-up with PCP and pulmonology as scheduled over the next 1 to 2 weeks.  Discharge Diagnoses:  Principal Problem:   Acute on chronic respiratory failure (HCC)  Acute on chronic hypoxic and hypercarbic respiratory failure secondary to acute exacerbation of chronic obstructive pulmonary  disease Community-acquired pneumonia ruled out Successfully extubated 11/19 Currently on home oxygen at 3.5 L, ambulatory walk screen successful without any hypoxia Pulmonology team recommending BiPAP at night, TOC able to obtain device for discharge Steroid course completed Concurrent metabolic alkalosis/hypochloremia appear to be at baseline(consistent with labs over the past year)   Acute metabolic/hypercarbic encephalopathy  Resolved, patient is alert, awake and following commands   Chronic HFpEF without acute exacerbation HTN CAD Continue home medications including furosemide, aspirin, atorvastatin, and Lopressor   Recurrent UTIs Continue Urecholine and Flomax   Hypokalemia, resolved   Anxiety and depression Chronic pain issues Discontinue alprazolam at discharge given respiratory status -Would recommend weaning off narcotics as well Continue Lexapro  Discharge Instructions   Allergies as of 11/17/2023       Reactions   Neurontin [gabapentin] Other (See Comments)   Lightheadedness  Groggy         Medication List     STOP taking these medications    ALPRAZolam 0.25 MG tablet Commonly known as: XANAX   diltiazem 180 MG 24 hr capsule Commonly known as: CARDIZEM CD       TAKE these medications    albuterol 108 (90 Base) MCG/ACT inhaler Commonly known as: VENTOLIN HFA Inhale 2 puffs into the lungs every 4 (four) hours as needed for wheezing or shortness of breath. For shortness of breath   aspirin EC 81 MG tablet Take 81 mg by mouth daily.   atorvastatin 80 MG tablet Commonly known as: LIPITOR Take 1 tablet (80 mg total) by mouth daily.   bethanechol 10 MG tablet Commonly known as: URECHOLINE Take 2 tablets (20 mg total) by mouth daily.   budesonide 0.25 MG/2ML nebulizer solution Commonly known as: PULMICORT Take 2 mLs (0.25 mg total) by nebulization 2 (two) times daily. What  changed:  when to take this reasons to take this   CENTRUM ADULT  PO Take 1 tablet by mouth daily.   escitalopram 5 MG tablet Commonly known as: LEXAPRO Take 5 mg by mouth daily.   furosemide 20 MG tablet Commonly known as: LASIX Take 1 tablet (20 mg total) by mouth daily. What changed:  when to take this reasons to take this   hydrochlorothiazide 12.5 MG tablet Commonly known as: HYDRODIURIL Take 1 tablet (12.5 mg total) by mouth daily.   HYDROcodone-acetaminophen 10-325 MG tablet Commonly known as: NORCO Take 1 tablet by mouth every 6 (six) hours as needed for moderate pain (pain score 4-6). 1 tablet every 3-4 hours as needed for pain.   ipratropium-albuterol 0.5-2.5 (3) MG/3ML Soln Commonly known as: DUONEB Take 3 mLs by nebulization in the morning, at noon, in the evening, and at bedtime. What changed:  when to take this reasons to take this   metoprolol tartrate 25 MG tablet Commonly known as: LOPRESSOR Take 1 tablet (25 mg total) by mouth 2 (two) times daily.   pantoprazole 20 MG tablet Commonly known as: PROTONIX Take 1 tablet (20 mg total) by mouth daily.   tamsulosin 0.4 MG Caps capsule Commonly known as: FLOMAX Take 1 capsule (0.4 mg total) by mouth daily.               Durable Medical Equipment  (From admission, onward)           Start     Ordered   11/16/23 1243  For home use only DME Bipap  Once       Question:  Length of Need  Answer:  Lifetime   11/16/23 1242            Follow-up Information     [facility] Follow up.   Specialty: Home Health Services Why: Someone will call you to schedule resumption of care order. Contact information: [address] [place] [address] [phone]                Allergies  Allergen Reactions   Neurontin [Gabapentin] Other (See Comments)    Lightheadedness  Groggy     Consultations: PCCM  Procedures/Studies: DG Chest Port 1 View  Result Date: 11/13/2023 CLINICAL DATA:  Pneumonia.  Altered mental  status EXAM: PORTABLE CHEST 1 VIEW COMPARISON:  Yesterday FINDINGS: Endotracheal tube with tip just below the clavicular heads. An enteric tube reaches the stomach. Indistinct density in the lower lungs, new/progressed on the left with obscured diaphragm. Volume loss and pleural thickening at the right base from scarring by prior CT. Stable generous heart size distorted by rotation. No pneumothorax. IMPRESSION: 1. Stable hardware positioning. 2. Worsening infiltrate at the left base. 3. Chronic volume loss and scarring at the right base. Electronically Signed   By: Tiburcio Pea M.D.   On: 11/13/2023 04:12   DG Chest Port 1 View  Result Date: 11/12/2023 CLINICAL DATA:  Intubation EXAM: PORTABLE CHEST 1 VIEW COMPARISON:  Same-day x-ray FINDINGS: Interval placement of endotracheal tube terminating 2.6 cm above the carina. Interval placement of enteric tube extending into the stomach, distal tip beyond the inferior margin of the film. Stable heart size. Small right-sided pleural effusion with right greater than left bibasilar opacities. No pneumothorax. IMPRESSION: 1. Interval placement of endotracheal and enteric tubes, as above. 2. Small right-sided pleural effusion with right greater than left bibasilar opacities. Electronically Signed   By: Janyth Pupa  Plundo D.O.   On: 11/12/2023 16:15   DG Abd Portable 1 View  Result Date: 11/12/2023 CLINICAL DATA:  Enteric tube placement EXAM: PORTABLE ABDOMEN - 1 VIEW COMPARISON:  Abdominal radiograph dated 12/09/2022 FINDINGS: Gastric/enteric tube tip projects over the stomach with side hole below the gastroesophageal junction projecting over the gastric cardia. Partially imaged nonobstructive bowel gas pattern. No supine finding of free air or pneumatosis. No abnormal mass effect. No acute or substantial osseous abnormality. The sacrum and coccyx are partially obscured by overlying bowel contents. Unchanged blunting of the right costophrenic angle with radiodensity  along the right pleura. IMPRESSION: Gastric/enteric tube tip projects over the stomach with side hole below the gastroesophageal junction projecting over the gastric cardia. Electronically Signed   By: Agustin Cree M.D.   On: 11/12/2023 15:37   DG Chest Port 1 View  Result Date: 11/12/2023 CLINICAL DATA:  Shortness of breath. EXAM: PORTABLE CHEST 1 VIEW COMPARISON:  10/11/2023. FINDINGS: Redemonstration of nonspecific opacities throughout bilateral lungs. There is homogeneous opacification along the right lower lateral hemithorax, which may represent loculated pleural effusion versus pleural thickening. There are adjacent heterogeneous opacities in the right lower lung zone which may represent combination of atelectasis and/or scarring with or without superimposed pneumonitis. Correlate clinically. There is stable right apical pleural cap. There also left basilar opacities which may represent left lung atelectasis and/or scarring. Left lateral costophrenic angle is not included in the film. However, no large left pleural effusion seen. There is mild pulmonary vascular congestion. No pneumothorax. Bilateral lung fields are clear. Bilateral costophrenic angles are clear. Stable mildly enlarged cardio-mediastinal silhouette. No acute osseous abnormalities. The soft tissues are within normal limits. IMPRESSION: *Mild pulmonary vascular congestion, new since the prior study. *Persistent bibasilar opacities, as described in detail above. Left lateral costophrenic angle is not included however, no large left pleural effusion seen. Electronically Signed   By: Jules Schick M.D.   On: 11/12/2023 12:49     Subjective: No acute issues or events overnight   Discharge Exam: Vitals:   11/17/23 1200 11/17/23 1300  BP:    Pulse: 91 83  Resp: 15 16  Temp:    SpO2: 95% 95%   Vitals:   11/17/23 1100 11/17/23 1150 11/17/23 1200 11/17/23 1300  BP:      Pulse: 79  91 83  Resp:   15 16  Temp:  99.1 F (37.3 C)     TempSrc:  Oral    SpO2: 91%  95% 95%  Weight:      Height:        General: Pt is alert, awake, not in acute distress Cardiovascular: RRR, S1/S2 +, no rubs, no gallops Respiratory: CTA bilaterally, no wheezing, no rhonchi Abdominal: Soft, NT, ND, bowel sounds + Extremities: no edema, no cyanosis    The results of significant diagnostics from this hospitalization (including imaging, microbiology, ancillary and laboratory) are listed below for reference.     Microbiology: Recent Results (from the past 240 hour(s))  Resp panel by RT-PCR (RSV, Flu A&B, Covid) Anterior Nasal Swab     Status: None   Collection Time: 11/12/23 12:13 PM   Specimen: Anterior Nasal Swab  Result Value Ref Range Status   SARS Coronavirus 2 by RT PCR NEGATIVE NEGATIVE Final   Influenza A by PCR NEGATIVE NEGATIVE Final   Influenza B by PCR NEGATIVE NEGATIVE Final    Comment: (NOTE) The Xpert Xpress SARS-CoV-2/FLU/RSV plus assay is intended as an aid in the diagnosis of  influenza from Nasopharyngeal swab specimens and should not be used as a sole basis for treatment. Nasal washings and aspirates are unacceptable for Xpert Xpress SARS-CoV-2/FLU/RSV testing.  Fact Sheet for Patients: BloggerCourse.com  Fact Sheet for Healthcare Providers: SeriousBroker.it  This test is not yet approved or cleared by the [place] FDA and has been authorized for detection and/or diagnosis of SARS-CoV-2 by FDA under an Emergency Use Authorization (EUA). This EUA will remain in effect (meaning this test can be used) for the duration of the COVID-19 declaration under Section 564(b)(1) of the Act, 21 U.S.C. section 360bbb-3(b)(1), unless the authorization is terminated or revoked.     Resp Syncytial Virus by PCR NEGATIVE NEGATIVE Final    Comment: (NOTE) Fact Sheet for Patients: BloggerCourse.com  Fact Sheet for Healthcare  Providers: SeriousBroker.it  This test is not yet approved or cleared by the [place] FDA and has been authorized for detection and/or diagnosis of SARS-CoV-2 by FDA under an Emergency Use Authorization (EUA). This EUA will remain in effect (meaning this test can be used) for the duration of the COVID-19 declaration under Section 564(b)(1) of the Act, 21 U.S.C. section 360bbb-3(b)(1), unless the authorization is terminated or revoked.  Performed at [facility], [address] [place], [place] [address]   MRSA Next Gen by PCR, Nasal     Status: None   Collection Time: 11/12/23  3:36 PM   Specimen: Nasal Mucosa; Nasal Swab  Result Value Ref Range Status   MRSA by PCR Next Gen NOT DETECTED NOT DETECTED Final    Comment: (NOTE) The GeneXpert MRSA Assay (FDA approved for NASAL specimens only), is one component of a comprehensive MRSA colonization surveillance program. It is not intended to diagnose MRSA infection nor to guide or monitor treatment for MRSA infections. Test performance is not FDA approved in patients less than 39 years old. Performed at [facility], [address], [place], [place] [address]   Respiratory (~20 pathogens) panel by PCR     Status: None   Collection Time: 11/12/23  4:04 PM   Specimen: Nasopharyngeal Swab; Respiratory  Result Value Ref Range Status   Adenovirus NOT DETECTED NOT DETECTED Final   Coronavirus 229E NOT DETECTED NOT DETECTED Final    Comment: (NOTE) The Coronavirus on the Respiratory Panel, DOES NOT test for the novel  Coronavirus (2019 nCoV)    Coronavirus HKU1 NOT DETECTED NOT DETECTED Final   Coronavirus NL63 NOT DETECTED NOT DETECTED Final   Coronavirus OC43 NOT DETECTED NOT DETECTED Final   Metapneumovirus NOT DETECTED NOT DETECTED Final   Rhinovirus / Enterovirus NOT DETECTED NOT DETECTED Final   Influenza A NOT DETECTED NOT DETECTED Final   Influenza B NOT DETECTED NOT DETECTED Final    Parainfluenza Virus 1 NOT DETECTED NOT DETECTED Final   Parainfluenza Virus 2 NOT DETECTED NOT DETECTED Final   Parainfluenza Virus 3 NOT DETECTED NOT DETECTED Final   Parainfluenza Virus 4 NOT DETECTED NOT DETECTED Final   Respiratory Syncytial Virus NOT DETECTED NOT DETECTED Final   Bordetella pertussis NOT DETECTED NOT DETECTED Final   Bordetella Parapertussis NOT DETECTED NOT DETECTED Final   Chlamydophila pneumoniae NOT DETECTED NOT DETECTED Final   Mycoplasma pneumoniae NOT DETECTED NOT DETECTED Final    Comment: Performed at [facility], [address], [place], [place] [address]  Culture, Respiratory w Gram Stain     Status: None   Collection Time: 11/12/23  4:39 PM   Specimen: Tracheal Aspirate;  Respiratory  Result Value Ref Range Status   Specimen Description TRACHEAL ASPIRATE  Final   Special Requests NONE  Final   Gram Stain NO WBC SEEN NO ORGANISMS SEEN   Final   Culture   Final    Normal respiratory flora-no Staph aureus or Pseudomonas seen Performed at [facility], [address] [place], [place] [address]    Report Status 11/15/2023 FINAL  Final     Labs: BNP (last 3 results) Recent Labs    11/14/23 0349 11/15/23 0315 11/16/23 0249  BNP 164.3* 836.2* 548.6*   Basic Metabolic Panel: Recent Labs  Lab 11/13/23 1007 11/14/23 0349 11/15/23 0315 11/16/23 0249 11/17/23 1026  NA 138 137 136 136 132*  K 2.8* 4.2 4.0 3.5 3.4*  CL 85* 89* 88* 86* 85*  CO2 43* 37* 38* 41* 36*  GLUCOSE 164* 99 89 112* 119*  BUN 15 18 19 22 21   CREATININE 0.74 0.59 0.57 0.84 0.72  CALCIUM 9.0 8.6* 8.6* 9.1 9.0  MG 1.8  --   --   --   --    Liver Function Tests: Recent Labs  Lab 11/12/23 1215  AST 15  ALT 14  ALKPHOS 67  BILITOT 0.5  PROT 6.3*  ALBUMIN 3.6   No results for input(s): "LIPASE", "AMYLASE" in the last 168 hours. No results for input(s): "AMMONIA" in the last 168 hours. CBC: Recent Labs  Lab 11/12/23 1215 11/12/23 1601 11/12/23 1748  11/13/23 0430 11/13/23 0818 11/13/23 1007 11/15/23 0315  WBC 10.7*  --  9.9  --   --  7.9 16.3*  NEUTROABS 8.3*  --   --   --   --   --   --   HGB 10.0*   < > 9.6* 10.5* 11.2* 10.0* 10.6*  HCT 37.1   < > 34.2* 31.0* 33.0* 33.3* 35.7*  MCV 106.9*  --  102.7*  --   --  98.2 96.7  PLT 156  --  149*  --   --  168 171   < > = values in this interval not displayed.   Cardiac Enzymes: No results for input(s): "CKTOTAL", "CKMB", "CKMBINDEX", "TROPONINI" in the last 168 hours. BNP: Invalid input(s): "POCBNP" CBG: Recent Labs  Lab 11/16/23 1936 11/16/23 2324 11/17/23 0323 11/17/23 0759 11/17/23 1152  GLUCAP 163* 87 95 145* 98   D-Dimer No results for input(s): "DDIMER" in the last 72 hours. Hgb A1c No results for input(s): "HGBA1C" in the last 72 hours. Lipid Profile No results for input(s): "CHOL", "HDL", "LDLCALC", "TRIG", "CHOLHDL", "LDLDIRECT" in the last 72 hours. Thyroid function studies No results for input(s): "TSH", "T4TOTAL", "T3FREE", "THYROIDAB" in the last 72 hours.  Invalid input(s): "FREET3" Anemia work up No results for input(s): "VITAMINB12", "FOLATE", "FERRITIN", "TIBC", "IRON", "RETICCTPCT" in the last 72 hours. Urinalysis    Component Value Date/Time   COLORURINE YELLOW 07/19/2023 1221   APPEARANCEUR Clear 09/21/2023 1507   LABSPEC 1.009 07/19/2023 1221   PHURINE 7.0 07/19/2023 1221   GLUCOSEU Negative 09/21/2023 1507   HGBUR NEGATIVE 07/19/2023 1221   BILIRUBINUR Negative 09/21/2023 1507   KETONESUR NEGATIVE 07/19/2023 1221   PROTEINUR Negative 09/21/2023 1507   PROTEINUR NEGATIVE 07/19/2023 1221   UROBILINOGEN 0.2 08/17/2011 1302   NITRITE Negative 09/21/2023 1507   NITRITE NEGATIVE 07/19/2023 1221   LEUKOCYTESUR Negative 09/21/2023 1507   LEUKOCYTESUR NEGATIVE 07/19/2023 1221   Sepsis Labs Recent Labs  Lab 11/12/23 1215 11/12/23 1748 11/13/23 1007 11/15/23 0315  WBC  10.7* 9.9 7.9 16.3*   Microbiology Recent Results (from the past 240  hour(s))  Resp panel by RT-PCR (RSV, Flu A&B, Covid) Anterior Nasal Swab     Status: None   Collection Time: 11/12/23 12:13 PM   Specimen: Anterior Nasal Swab  Result Value Ref Range Status   SARS Coronavirus 2 by RT PCR NEGATIVE NEGATIVE Final   Influenza A by PCR NEGATIVE NEGATIVE Final   Influenza B by PCR NEGATIVE NEGATIVE Final    Comment: (NOTE) The Xpert Xpress SARS-CoV-2/FLU/RSV plus assay is intended as an aid in the diagnosis of influenza from Nasopharyngeal swab specimens and should not be used as a sole basis for treatment. Nasal washings and aspirates are unacceptable for Xpert Xpress SARS-CoV-2/FLU/RSV testing.  Fact Sheet for Patients: BloggerCourse.com  Fact Sheet for Healthcare Providers: SeriousBroker.it  This test is not yet approved or cleared by the [place] FDA and has been authorized for detection and/or diagnosis of SARS-CoV-2 by FDA under an Emergency Use Authorization (EUA). This EUA will remain in effect (meaning this test can be used) for the duration of the COVID-19 declaration under Section 564(b)(1) of the Act, 21 U.S.C. section 360bbb-3(b)(1), unless the authorization is terminated or revoked.     Resp Syncytial Virus by PCR NEGATIVE NEGATIVE Final    Comment: (NOTE) Fact Sheet for Patients: BloggerCourse.com  Fact Sheet for Healthcare Providers: SeriousBroker.it  This test is not yet approved or cleared by the [place] FDA and has been authorized for detection and/or diagnosis of SARS-CoV-2 by FDA under an Emergency Use Authorization (EUA). This EUA will remain in effect (meaning this test can be used) for the duration of the COVID-19 declaration under Section 564(b)(1) of the Act, 21 U.S.C. section 360bbb-3(b)(1), unless the authorization is terminated or revoked.  Performed at [facility], [address] [place],  [place] [address]   MRSA Next Gen by PCR, Nasal     Status: None   Collection Time: 11/12/23  3:36 PM   Specimen: Nasal Mucosa; Nasal Swab  Result Value Ref Range Status   MRSA by PCR Next Gen NOT DETECTED NOT DETECTED Final    Comment: (NOTE) The GeneXpert MRSA Assay (FDA approved for NASAL specimens only), is one component of a comprehensive MRSA colonization surveillance program. It is not intended to diagnose MRSA infection nor to guide or monitor treatment for MRSA infections. Test performance is not FDA approved in patients less than 44 years old. Performed at [facility], [address] [place], [place] [address]   Respiratory (~20 pathogens) panel by PCR     Status: None   Collection Time: 11/12/23  4:04 PM   Specimen: Nasopharyngeal Swab; Respiratory  Result Value Ref Range Status   Adenovirus NOT DETECTED NOT DETECTED Final   Coronavirus 229E NOT DETECTED NOT DETECTED Final    Comment: (NOTE) The Coronavirus on the Respiratory Panel, DOES NOT test for the novel  Coronavirus (2019 nCoV)    Coronavirus HKU1 NOT DETECTED NOT DETECTED Final   Coronavirus NL63 NOT DETECTED NOT DETECTED Final   Coronavirus OC43 NOT DETECTED NOT DETECTED Final   Metapneumovirus NOT DETECTED NOT DETECTED Final   Rhinovirus / Enterovirus NOT DETECTED NOT DETECTED Final   Influenza A NOT DETECTED NOT DETECTED Final   Influenza B NOT DETECTED NOT DETECTED Final   Parainfluenza Virus 1 NOT DETECTED NOT DETECTED Final   Parainfluenza Virus 2 NOT DETECTED NOT DETECTED Final   Parainfluenza Virus 3 NOT DETECTED NOT  DETECTED Final   Parainfluenza Virus 4 NOT DETECTED NOT DETECTED Final   Respiratory Syncytial Virus NOT DETECTED NOT DETECTED Final   Bordetella pertussis NOT DETECTED NOT DETECTED Final   Bordetella Parapertussis NOT DETECTED NOT DETECTED Final   Chlamydophila pneumoniae NOT DETECTED NOT DETECTED Final   Mycoplasma pneumoniae NOT DETECTED NOT DETECTED Final    Comment: Performed at  [facility], [address] [place], [place] [address]  Culture, Respiratory w Gram Stain     Status: None   Collection Time: 11/12/23  4:39 PM   Specimen: Tracheal Aspirate; Respiratory  Result Value Ref Range Status   Specimen Description TRACHEAL ASPIRATE  Final   Special Requests NONE  Final   Gram Stain NO WBC SEEN NO ORGANISMS SEEN   Final   Culture   Final    Normal respiratory flora-no Staph aureus or Pseudomonas seen Performed at [facility], [address], [place], [place] [address]    Report Status 11/15/2023 FINAL  Final     Time coordinating discharge: Over 30 minutes  SIGNED:   Azucena Fallen, DO [facility] 11/17/2023, 1:56 PM Pager   If 7PM-7AM, please contact night-coverage [email or web address]

## 2023-11-17 NOTE — TOC Transition Note (Signed)
Transition of Care [facility]) - CM/SW Discharge Note   Patient Details  Name: [name] MRN: [id number] Date of Birth: 06-Jul-1951  Transition of Care [facility]) CM/SW Contact:  Tom-Johnson, [name], RN Phone Number: 11/17/2023, 2:06 PM   Clinical Narrative:     Patient is scheduled for discharge today.  Readmission Risk Assessment done. Home health info, hospital f/u and discharge instructions on AVS. BIPAP/NIV ordered from [employer], Mitch to deliver to patient's home. Daughter, [name] to transport at discharge.  No further TOC needs noted.         Final next level of care: Home w Home Health Services Barriers to Discharge: Barriers Resolved   Patient Goals and CMS Choice CMS Medicare.gov Compare Post Acute Care list provided to:: Patient Choice offered to / list presented to : Patient  Discharge Placement                  Patient to be transferred to facility by: Daughter Name of family member notified: [facility]    Discharge Plan and Services Additional resources added to the After Visit Summary for                  DME Arranged: [employer] DME Agency: [employer] Date DME Agency Contacted: 11/16/23 Time DME Agency Contacted: 1240 Representative spoke with at DME Agency: [facility] HH Arranged: PT, OT, RN, Nurse's Aide HH Agency: [facility] Date [facility] Agency Contacted: 11/16/23 Time HH Agency Contacted: 1230 Representative spoke with at [facility] Agency: Tresa Endo  Social Determinants of Health (SDOH) Interventions SDOH Screenings   Food Insecurity: No Food Insecurity (11/15/2023)  Housing: Low Risk  (11/15/2023)  Transportation Needs: No Transportation Needs (11/15/2023)  Utilities: Not At Risk (11/15/2023)  Tobacco Use: Medium Risk (11/14/2023)     Readmission Risk Interventions    11/17/2023    2:03 PM 07/12/2023   12:24 PM  Readmission Risk Prevention Plan  Transportation Screening Complete Complete  PCP or Specialist Appt within 5-7 Days   Complete  PCP or Specialist Appt within 3-5 Days Complete   Home Care Screening  Complete  Medication Review (RN CM)  Complete  HRI or Home Care Consult Complete   Social Work Consult for Recovery Care Planning/Counseling Complete   Palliative Care Screening Not Applicable   Medication Review [facility]) Referral to Pharmacy

## 2023-11-17 NOTE — Plan of Care (Signed)
  Problem: Coping: Goal: Ability to adjust to condition or change in health will improve Outcome: Progressing   

## 2023-11-17 NOTE — Progress Notes (Signed)
Patients condition quickly deteriorates without ventilator. Removal of the ventilator may cause serious harm to the patient, exacerbation of condition and hospital readmission.   Bilevel/RAD has been tried and failed to maintain or stabilize the patient. Bilevel cannot meet current volume requirements. Patient requires frequent durations of ventilatory support. Intermittent usage is insufficient.

## 2023-11-20 ENCOUNTER — Telehealth: Payer: Self-pay | Admitting: Internal Medicine

## 2023-11-20 MED ORDER — METOPROLOL TARTRATE 25 MG PO TABS: 25.0000 mg | ORAL_TABLET | Freq: Two times a day (BID) | ORAL | 0 refills | Status: AC

## 2023-11-20 MED ORDER — HYDROCHLOROTHIAZIDE 12.5 MG PO TABS: 12.5000 mg | ORAL_TABLET | Freq: Every day | ORAL | 0 refills | Status: DC

## 2023-11-20 NOTE — Telephone Encounter (Signed)
Patient was discharged from the hospital on Friday 11/17/23--she refills on the following medications:  Metoprolol Tartrate 25mg  2 times daily and Hydrochlorothiazide 12.5 mg 1 daily--Please cal [facility]--[phone]---Patient call back [phone]

## 2023-11-20 NOTE — Telephone Encounter (Signed)
Refills sent.  Patient should schedule hospital follow up.

## 2023-11-22 DIAGNOSIS — K219 Gastro-esophageal reflux disease without esophagitis: Secondary | ICD-10-CM | POA: Diagnosis not present

## 2023-11-22 DIAGNOSIS — Z96622 Presence of left artificial elbow joint: Secondary | ICD-10-CM | POA: Diagnosis not present

## 2023-11-22 DIAGNOSIS — G8929 Other chronic pain: Secondary | ICD-10-CM | POA: Diagnosis not present

## 2023-11-22 DIAGNOSIS — Z87891 Personal history of nicotine dependence: Secondary | ICD-10-CM | POA: Diagnosis not present

## 2023-11-22 DIAGNOSIS — S51812D Laceration without foreign body of left forearm, subsequent encounter: Secondary | ICD-10-CM | POA: Diagnosis not present

## 2023-11-22 DIAGNOSIS — I110 Hypertensive heart disease with heart failure: Secondary | ICD-10-CM | POA: Diagnosis not present

## 2023-11-22 DIAGNOSIS — Z79891 Long term (current) use of opiate analgesic: Secondary | ICD-10-CM | POA: Diagnosis not present

## 2023-11-22 DIAGNOSIS — J9622 Acute and chronic respiratory failure with hypercapnia: Secondary | ICD-10-CM | POA: Diagnosis not present

## 2023-11-22 DIAGNOSIS — Z9181 History of falling: Secondary | ICD-10-CM | POA: Diagnosis not present

## 2023-11-22 DIAGNOSIS — Z87440 Personal history of urinary (tract) infections: Secondary | ICD-10-CM | POA: Diagnosis not present

## 2023-11-22 DIAGNOSIS — E7800 Pure hypercholesterolemia, unspecified: Secondary | ICD-10-CM | POA: Diagnosis not present

## 2023-11-22 DIAGNOSIS — I480 Paroxysmal atrial fibrillation: Secondary | ICD-10-CM | POA: Diagnosis not present

## 2023-11-22 DIAGNOSIS — J449 Chronic obstructive pulmonary disease, unspecified: Secondary | ICD-10-CM | POA: Diagnosis not present

## 2023-11-22 DIAGNOSIS — D539 Nutritional anemia, unspecified: Secondary | ICD-10-CM | POA: Diagnosis not present

## 2023-11-22 DIAGNOSIS — I5032 Chronic diastolic (congestive) heart failure: Secondary | ICD-10-CM | POA: Diagnosis not present

## 2023-11-22 DIAGNOSIS — Z9842 Cataract extraction status, left eye: Secondary | ICD-10-CM | POA: Diagnosis not present

## 2023-11-22 DIAGNOSIS — J9621 Acute and chronic respiratory failure with hypoxia: Secondary | ICD-10-CM | POA: Diagnosis not present

## 2023-11-27 DIAGNOSIS — Z79891 Long term (current) use of opiate analgesic: Secondary | ICD-10-CM | POA: Diagnosis not present

## 2023-11-27 DIAGNOSIS — Z9842 Cataract extraction status, left eye: Secondary | ICD-10-CM | POA: Diagnosis not present

## 2023-11-27 DIAGNOSIS — E7800 Pure hypercholesterolemia, unspecified: Secondary | ICD-10-CM | POA: Diagnosis not present

## 2023-11-27 DIAGNOSIS — K219 Gastro-esophageal reflux disease without esophagitis: Secondary | ICD-10-CM | POA: Diagnosis not present

## 2023-11-27 DIAGNOSIS — I110 Hypertensive heart disease with heart failure: Secondary | ICD-10-CM | POA: Diagnosis not present

## 2023-11-27 DIAGNOSIS — I480 Paroxysmal atrial fibrillation: Secondary | ICD-10-CM | POA: Diagnosis not present

## 2023-11-27 DIAGNOSIS — Z87440 Personal history of urinary (tract) infections: Secondary | ICD-10-CM | POA: Diagnosis not present

## 2023-11-27 DIAGNOSIS — J449 Chronic obstructive pulmonary disease, unspecified: Secondary | ICD-10-CM | POA: Diagnosis not present

## 2023-11-27 DIAGNOSIS — I5032 Chronic diastolic (congestive) heart failure: Secondary | ICD-10-CM | POA: Diagnosis not present

## 2023-11-27 DIAGNOSIS — J9621 Acute and chronic respiratory failure with hypoxia: Secondary | ICD-10-CM | POA: Diagnosis not present

## 2023-11-27 DIAGNOSIS — Z9181 History of falling: Secondary | ICD-10-CM | POA: Diagnosis not present

## 2023-11-27 DIAGNOSIS — S51812D Laceration without foreign body of left forearm, subsequent encounter: Secondary | ICD-10-CM | POA: Diagnosis not present

## 2023-11-27 DIAGNOSIS — G8929 Other chronic pain: Secondary | ICD-10-CM | POA: Diagnosis not present

## 2023-11-27 DIAGNOSIS — Z87891 Personal history of nicotine dependence: Secondary | ICD-10-CM | POA: Diagnosis not present

## 2023-11-27 DIAGNOSIS — Z96622 Presence of left artificial elbow joint: Secondary | ICD-10-CM | POA: Diagnosis not present

## 2023-11-27 DIAGNOSIS — D539 Nutritional anemia, unspecified: Secondary | ICD-10-CM | POA: Diagnosis not present

## 2023-11-27 DIAGNOSIS — J9622 Acute and chronic respiratory failure with hypercapnia: Secondary | ICD-10-CM | POA: Diagnosis not present

## 2023-11-29 DIAGNOSIS — Z96622 Presence of left artificial elbow joint: Secondary | ICD-10-CM | POA: Diagnosis not present

## 2023-11-29 DIAGNOSIS — Z9842 Cataract extraction status, left eye: Secondary | ICD-10-CM | POA: Diagnosis not present

## 2023-11-29 DIAGNOSIS — K219 Gastro-esophageal reflux disease without esophagitis: Secondary | ICD-10-CM | POA: Diagnosis not present

## 2023-11-29 DIAGNOSIS — Z87440 Personal history of urinary (tract) infections: Secondary | ICD-10-CM | POA: Diagnosis not present

## 2023-11-29 DIAGNOSIS — Z79891 Long term (current) use of opiate analgesic: Secondary | ICD-10-CM | POA: Diagnosis not present

## 2023-11-29 DIAGNOSIS — J9621 Acute and chronic respiratory failure with hypoxia: Secondary | ICD-10-CM | POA: Diagnosis not present

## 2023-11-29 DIAGNOSIS — J449 Chronic obstructive pulmonary disease, unspecified: Secondary | ICD-10-CM | POA: Diagnosis not present

## 2023-11-29 DIAGNOSIS — Z87891 Personal history of nicotine dependence: Secondary | ICD-10-CM | POA: Diagnosis not present

## 2023-11-29 DIAGNOSIS — J9622 Acute and chronic respiratory failure with hypercapnia: Secondary | ICD-10-CM | POA: Diagnosis not present

## 2023-11-29 DIAGNOSIS — I480 Paroxysmal atrial fibrillation: Secondary | ICD-10-CM | POA: Diagnosis not present

## 2023-11-29 DIAGNOSIS — G8929 Other chronic pain: Secondary | ICD-10-CM | POA: Diagnosis not present

## 2023-11-29 DIAGNOSIS — S51812D Laceration without foreign body of left forearm, subsequent encounter: Secondary | ICD-10-CM | POA: Diagnosis not present

## 2023-11-29 DIAGNOSIS — E7800 Pure hypercholesterolemia, unspecified: Secondary | ICD-10-CM | POA: Diagnosis not present

## 2023-11-29 DIAGNOSIS — I110 Hypertensive heart disease with heart failure: Secondary | ICD-10-CM | POA: Diagnosis not present

## 2023-11-29 DIAGNOSIS — Z9181 History of falling: Secondary | ICD-10-CM | POA: Diagnosis not present

## 2023-11-29 DIAGNOSIS — I5032 Chronic diastolic (congestive) heart failure: Secondary | ICD-10-CM | POA: Diagnosis not present

## 2023-11-29 DIAGNOSIS — D539 Nutritional anemia, unspecified: Secondary | ICD-10-CM | POA: Diagnosis not present

## 2023-11-30 ENCOUNTER — Telehealth: Payer: Self-pay | Admitting: Internal Medicine

## 2023-11-30 NOTE — Telephone Encounter (Signed)
Spoke with [employer] and [employer] and they verified they do not need anything further and the vent has been approved by [employer] [facility] [employer].   Spoke with patient and advised. Nothing further needed at this time.

## 2023-11-30 NOTE — Telephone Encounter (Signed)
Patient was recently hospitalized for carbon monoxide build up and was sent home with a ventilator-to help prevent build up --patient would like for Dr. Sherene Sires to write a letter to her insurance company [facility]) stating this is medically necessary.  Please call patient at [phone]

## 2023-12-01 DIAGNOSIS — G8929 Other chronic pain: Secondary | ICD-10-CM | POA: Diagnosis not present

## 2023-12-01 DIAGNOSIS — D539 Nutritional anemia, unspecified: Secondary | ICD-10-CM | POA: Diagnosis not present

## 2023-12-01 DIAGNOSIS — Z9842 Cataract extraction status, left eye: Secondary | ICD-10-CM | POA: Diagnosis not present

## 2023-12-01 DIAGNOSIS — I480 Paroxysmal atrial fibrillation: Secondary | ICD-10-CM | POA: Diagnosis not present

## 2023-12-01 DIAGNOSIS — I5032 Chronic diastolic (congestive) heart failure: Secondary | ICD-10-CM | POA: Diagnosis not present

## 2023-12-01 DIAGNOSIS — K219 Gastro-esophageal reflux disease without esophagitis: Secondary | ICD-10-CM | POA: Diagnosis not present

## 2023-12-01 DIAGNOSIS — Z96622 Presence of left artificial elbow joint: Secondary | ICD-10-CM | POA: Diagnosis not present

## 2023-12-01 DIAGNOSIS — J449 Chronic obstructive pulmonary disease, unspecified: Secondary | ICD-10-CM | POA: Diagnosis not present

## 2023-12-01 DIAGNOSIS — Z79891 Long term (current) use of opiate analgesic: Secondary | ICD-10-CM | POA: Diagnosis not present

## 2023-12-01 DIAGNOSIS — S51812D Laceration without foreign body of left forearm, subsequent encounter: Secondary | ICD-10-CM | POA: Diagnosis not present

## 2023-12-01 DIAGNOSIS — E7800 Pure hypercholesterolemia, unspecified: Secondary | ICD-10-CM | POA: Diagnosis not present

## 2023-12-01 DIAGNOSIS — Z87891 Personal history of nicotine dependence: Secondary | ICD-10-CM | POA: Diagnosis not present

## 2023-12-01 DIAGNOSIS — J9621 Acute and chronic respiratory failure with hypoxia: Secondary | ICD-10-CM | POA: Diagnosis not present

## 2023-12-01 DIAGNOSIS — Z87440 Personal history of urinary (tract) infections: Secondary | ICD-10-CM | POA: Diagnosis not present

## 2023-12-01 DIAGNOSIS — I110 Hypertensive heart disease with heart failure: Secondary | ICD-10-CM | POA: Diagnosis not present

## 2023-12-01 DIAGNOSIS — J9622 Acute and chronic respiratory failure with hypercapnia: Secondary | ICD-10-CM | POA: Diagnosis not present

## 2023-12-01 DIAGNOSIS — Z9181 History of falling: Secondary | ICD-10-CM | POA: Diagnosis not present

## 2023-12-04 DIAGNOSIS — Z96622 Presence of left artificial elbow joint: Secondary | ICD-10-CM | POA: Diagnosis not present

## 2023-12-04 DIAGNOSIS — Z87891 Personal history of nicotine dependence: Secondary | ICD-10-CM | POA: Diagnosis not present

## 2023-12-04 DIAGNOSIS — J9621 Acute and chronic respiratory failure with hypoxia: Secondary | ICD-10-CM | POA: Diagnosis not present

## 2023-12-04 DIAGNOSIS — I110 Hypertensive heart disease with heart failure: Secondary | ICD-10-CM | POA: Diagnosis not present

## 2023-12-04 DIAGNOSIS — I5032 Chronic diastolic (congestive) heart failure: Secondary | ICD-10-CM | POA: Diagnosis not present

## 2023-12-04 DIAGNOSIS — Z9181 History of falling: Secondary | ICD-10-CM | POA: Diagnosis not present

## 2023-12-04 DIAGNOSIS — K219 Gastro-esophageal reflux disease without esophagitis: Secondary | ICD-10-CM | POA: Diagnosis not present

## 2023-12-04 DIAGNOSIS — Z9842 Cataract extraction status, left eye: Secondary | ICD-10-CM | POA: Diagnosis not present

## 2023-12-04 DIAGNOSIS — S51812D Laceration without foreign body of left forearm, subsequent encounter: Secondary | ICD-10-CM | POA: Diagnosis not present

## 2023-12-04 DIAGNOSIS — Z79891 Long term (current) use of opiate analgesic: Secondary | ICD-10-CM | POA: Diagnosis not present

## 2023-12-04 DIAGNOSIS — I480 Paroxysmal atrial fibrillation: Secondary | ICD-10-CM | POA: Diagnosis not present

## 2023-12-04 DIAGNOSIS — J9622 Acute and chronic respiratory failure with hypercapnia: Secondary | ICD-10-CM | POA: Diagnosis not present

## 2023-12-04 DIAGNOSIS — J449 Chronic obstructive pulmonary disease, unspecified: Secondary | ICD-10-CM | POA: Diagnosis not present

## 2023-12-04 DIAGNOSIS — Z87440 Personal history of urinary (tract) infections: Secondary | ICD-10-CM | POA: Diagnosis not present

## 2023-12-04 DIAGNOSIS — G8929 Other chronic pain: Secondary | ICD-10-CM | POA: Diagnosis not present

## 2023-12-04 DIAGNOSIS — E7800 Pure hypercholesterolemia, unspecified: Secondary | ICD-10-CM | POA: Diagnosis not present

## 2023-12-04 DIAGNOSIS — D539 Nutritional anemia, unspecified: Secondary | ICD-10-CM | POA: Diagnosis not present

## 2023-12-06 DIAGNOSIS — E7800 Pure hypercholesterolemia, unspecified: Secondary | ICD-10-CM | POA: Diagnosis not present

## 2023-12-06 DIAGNOSIS — I480 Paroxysmal atrial fibrillation: Secondary | ICD-10-CM | POA: Diagnosis not present

## 2023-12-06 DIAGNOSIS — G8929 Other chronic pain: Secondary | ICD-10-CM | POA: Diagnosis not present

## 2023-12-06 DIAGNOSIS — Z96622 Presence of left artificial elbow joint: Secondary | ICD-10-CM | POA: Diagnosis not present

## 2023-12-06 DIAGNOSIS — S51812D Laceration without foreign body of left forearm, subsequent encounter: Secondary | ICD-10-CM | POA: Diagnosis not present

## 2023-12-06 DIAGNOSIS — Z87440 Personal history of urinary (tract) infections: Secondary | ICD-10-CM | POA: Diagnosis not present

## 2023-12-06 DIAGNOSIS — I110 Hypertensive heart disease with heart failure: Secondary | ICD-10-CM | POA: Diagnosis not present

## 2023-12-06 DIAGNOSIS — Z9842 Cataract extraction status, left eye: Secondary | ICD-10-CM | POA: Diagnosis not present

## 2023-12-06 DIAGNOSIS — Z79891 Long term (current) use of opiate analgesic: Secondary | ICD-10-CM | POA: Diagnosis not present

## 2023-12-06 DIAGNOSIS — Z87891 Personal history of nicotine dependence: Secondary | ICD-10-CM | POA: Diagnosis not present

## 2023-12-06 DIAGNOSIS — J449 Chronic obstructive pulmonary disease, unspecified: Secondary | ICD-10-CM | POA: Diagnosis not present

## 2023-12-06 DIAGNOSIS — J9621 Acute and chronic respiratory failure with hypoxia: Secondary | ICD-10-CM | POA: Diagnosis not present

## 2023-12-06 DIAGNOSIS — Z9181 History of falling: Secondary | ICD-10-CM | POA: Diagnosis not present

## 2023-12-06 DIAGNOSIS — D539 Nutritional anemia, unspecified: Secondary | ICD-10-CM | POA: Diagnosis not present

## 2023-12-06 DIAGNOSIS — J9622 Acute and chronic respiratory failure with hypercapnia: Secondary | ICD-10-CM | POA: Diagnosis not present

## 2023-12-06 DIAGNOSIS — K219 Gastro-esophageal reflux disease without esophagitis: Secondary | ICD-10-CM | POA: Diagnosis not present

## 2023-12-06 DIAGNOSIS — I5032 Chronic diastolic (congestive) heart failure: Secondary | ICD-10-CM | POA: Diagnosis not present

## 2023-12-08 DIAGNOSIS — Z79891 Long term (current) use of opiate analgesic: Secondary | ICD-10-CM | POA: Diagnosis not present

## 2023-12-08 DIAGNOSIS — Z87440 Personal history of urinary (tract) infections: Secondary | ICD-10-CM | POA: Diagnosis not present

## 2023-12-08 DIAGNOSIS — Z87891 Personal history of nicotine dependence: Secondary | ICD-10-CM | POA: Diagnosis not present

## 2023-12-08 DIAGNOSIS — E7800 Pure hypercholesterolemia, unspecified: Secondary | ICD-10-CM | POA: Diagnosis not present

## 2023-12-08 DIAGNOSIS — J9622 Acute and chronic respiratory failure with hypercapnia: Secondary | ICD-10-CM | POA: Diagnosis not present

## 2023-12-08 DIAGNOSIS — G8929 Other chronic pain: Secondary | ICD-10-CM | POA: Diagnosis not present

## 2023-12-08 DIAGNOSIS — K219 Gastro-esophageal reflux disease without esophagitis: Secondary | ICD-10-CM | POA: Diagnosis not present

## 2023-12-08 DIAGNOSIS — Z9842 Cataract extraction status, left eye: Secondary | ICD-10-CM | POA: Diagnosis not present

## 2023-12-08 DIAGNOSIS — Z9181 History of falling: Secondary | ICD-10-CM | POA: Diagnosis not present

## 2023-12-08 DIAGNOSIS — J449 Chronic obstructive pulmonary disease, unspecified: Secondary | ICD-10-CM | POA: Diagnosis not present

## 2023-12-08 DIAGNOSIS — I110 Hypertensive heart disease with heart failure: Secondary | ICD-10-CM | POA: Diagnosis not present

## 2023-12-08 DIAGNOSIS — I5032 Chronic diastolic (congestive) heart failure: Secondary | ICD-10-CM | POA: Diagnosis not present

## 2023-12-08 DIAGNOSIS — S51812D Laceration without foreign body of left forearm, subsequent encounter: Secondary | ICD-10-CM | POA: Diagnosis not present

## 2023-12-08 DIAGNOSIS — J9621 Acute and chronic respiratory failure with hypoxia: Secondary | ICD-10-CM | POA: Diagnosis not present

## 2023-12-08 DIAGNOSIS — D539 Nutritional anemia, unspecified: Secondary | ICD-10-CM | POA: Diagnosis not present

## 2023-12-08 DIAGNOSIS — I480 Paroxysmal atrial fibrillation: Secondary | ICD-10-CM | POA: Diagnosis not present

## 2023-12-08 DIAGNOSIS — Z96622 Presence of left artificial elbow joint: Secondary | ICD-10-CM | POA: Diagnosis not present

## 2023-12-13 DIAGNOSIS — I110 Hypertensive heart disease with heart failure: Secondary | ICD-10-CM | POA: Diagnosis not present

## 2023-12-13 DIAGNOSIS — Z9842 Cataract extraction status, left eye: Secondary | ICD-10-CM | POA: Diagnosis not present

## 2023-12-13 DIAGNOSIS — J9621 Acute and chronic respiratory failure with hypoxia: Secondary | ICD-10-CM | POA: Diagnosis not present

## 2023-12-13 DIAGNOSIS — G8929 Other chronic pain: Secondary | ICD-10-CM | POA: Diagnosis not present

## 2023-12-13 DIAGNOSIS — E7800 Pure hypercholesterolemia, unspecified: Secondary | ICD-10-CM | POA: Diagnosis not present

## 2023-12-13 DIAGNOSIS — D539 Nutritional anemia, unspecified: Secondary | ICD-10-CM | POA: Diagnosis not present

## 2023-12-13 DIAGNOSIS — Z87891 Personal history of nicotine dependence: Secondary | ICD-10-CM | POA: Diagnosis not present

## 2023-12-13 DIAGNOSIS — J9622 Acute and chronic respiratory failure with hypercapnia: Secondary | ICD-10-CM | POA: Diagnosis not present

## 2023-12-13 DIAGNOSIS — J449 Chronic obstructive pulmonary disease, unspecified: Secondary | ICD-10-CM | POA: Diagnosis not present

## 2023-12-13 DIAGNOSIS — K219 Gastro-esophageal reflux disease without esophagitis: Secondary | ICD-10-CM | POA: Diagnosis not present

## 2023-12-13 DIAGNOSIS — I480 Paroxysmal atrial fibrillation: Secondary | ICD-10-CM | POA: Diagnosis not present

## 2023-12-13 DIAGNOSIS — Z9181 History of falling: Secondary | ICD-10-CM | POA: Diagnosis not present

## 2023-12-13 DIAGNOSIS — Z79891 Long term (current) use of opiate analgesic: Secondary | ICD-10-CM | POA: Diagnosis not present

## 2023-12-13 DIAGNOSIS — I5032 Chronic diastolic (congestive) heart failure: Secondary | ICD-10-CM | POA: Diagnosis not present

## 2023-12-13 DIAGNOSIS — S51812D Laceration without foreign body of left forearm, subsequent encounter: Secondary | ICD-10-CM | POA: Diagnosis not present

## 2023-12-13 DIAGNOSIS — Z96622 Presence of left artificial elbow joint: Secondary | ICD-10-CM | POA: Diagnosis not present

## 2023-12-13 DIAGNOSIS — Z87440 Personal history of urinary (tract) infections: Secondary | ICD-10-CM | POA: Diagnosis not present

## 2023-12-14 DIAGNOSIS — I480 Paroxysmal atrial fibrillation: Secondary | ICD-10-CM | POA: Diagnosis not present

## 2023-12-14 DIAGNOSIS — E7800 Pure hypercholesterolemia, unspecified: Secondary | ICD-10-CM | POA: Diagnosis not present

## 2023-12-14 DIAGNOSIS — K219 Gastro-esophageal reflux disease without esophagitis: Secondary | ICD-10-CM | POA: Diagnosis not present

## 2023-12-14 DIAGNOSIS — S51812D Laceration without foreign body of left forearm, subsequent encounter: Secondary | ICD-10-CM | POA: Diagnosis not present

## 2023-12-14 DIAGNOSIS — Z79891 Long term (current) use of opiate analgesic: Secondary | ICD-10-CM | POA: Diagnosis not present

## 2023-12-14 DIAGNOSIS — Z9842 Cataract extraction status, left eye: Secondary | ICD-10-CM | POA: Diagnosis not present

## 2023-12-14 DIAGNOSIS — G8929 Other chronic pain: Secondary | ICD-10-CM | POA: Diagnosis not present

## 2023-12-14 DIAGNOSIS — Z96622 Presence of left artificial elbow joint: Secondary | ICD-10-CM | POA: Diagnosis not present

## 2023-12-14 DIAGNOSIS — J9621 Acute and chronic respiratory failure with hypoxia: Secondary | ICD-10-CM | POA: Diagnosis not present

## 2023-12-14 DIAGNOSIS — Z87891 Personal history of nicotine dependence: Secondary | ICD-10-CM | POA: Diagnosis not present

## 2023-12-14 DIAGNOSIS — Z87440 Personal history of urinary (tract) infections: Secondary | ICD-10-CM | POA: Diagnosis not present

## 2023-12-14 DIAGNOSIS — I5032 Chronic diastolic (congestive) heart failure: Secondary | ICD-10-CM | POA: Diagnosis not present

## 2023-12-14 DIAGNOSIS — J9622 Acute and chronic respiratory failure with hypercapnia: Secondary | ICD-10-CM | POA: Diagnosis not present

## 2023-12-14 DIAGNOSIS — Z9181 History of falling: Secondary | ICD-10-CM | POA: Diagnosis not present

## 2023-12-14 DIAGNOSIS — J449 Chronic obstructive pulmonary disease, unspecified: Secondary | ICD-10-CM | POA: Diagnosis not present

## 2023-12-14 DIAGNOSIS — D539 Nutritional anemia, unspecified: Secondary | ICD-10-CM | POA: Diagnosis not present

## 2023-12-14 DIAGNOSIS — I110 Hypertensive heart disease with heart failure: Secondary | ICD-10-CM | POA: Diagnosis not present

## 2023-12-15 DIAGNOSIS — E785 Hyperlipidemia, unspecified: Secondary | ICD-10-CM | POA: Diagnosis not present

## 2023-12-15 DIAGNOSIS — E559 Vitamin D deficiency, unspecified: Secondary | ICD-10-CM | POA: Diagnosis not present

## 2023-12-15 DIAGNOSIS — R7301 Impaired fasting glucose: Secondary | ICD-10-CM | POA: Diagnosis not present

## 2023-12-16 LAB — LAB REPORT - SCANNED: EGFR: 88

## 2023-12-27 DIAGNOSIS — J441 Chronic obstructive pulmonary disease with (acute) exacerbation: Secondary | ICD-10-CM | POA: Diagnosis not present

## 2023-12-27 DIAGNOSIS — J9622 Acute and chronic respiratory failure with hypercapnia: Secondary | ICD-10-CM | POA: Diagnosis not present

## 2023-12-27 DIAGNOSIS — E7800 Pure hypercholesterolemia, unspecified: Secondary | ICD-10-CM | POA: Diagnosis not present

## 2023-12-27 DIAGNOSIS — I480 Paroxysmal atrial fibrillation: Secondary | ICD-10-CM | POA: Diagnosis not present

## 2023-12-27 DIAGNOSIS — K219 Gastro-esophageal reflux disease without esophagitis: Secondary | ICD-10-CM | POA: Diagnosis not present

## 2023-12-27 DIAGNOSIS — J9621 Acute and chronic respiratory failure with hypoxia: Secondary | ICD-10-CM | POA: Diagnosis not present

## 2023-12-27 DIAGNOSIS — Z87440 Personal history of urinary (tract) infections: Secondary | ICD-10-CM | POA: Diagnosis not present

## 2023-12-27 DIAGNOSIS — D539 Nutritional anemia, unspecified: Secondary | ICD-10-CM | POA: Diagnosis not present

## 2023-12-27 DIAGNOSIS — Z7951 Long term (current) use of inhaled steroids: Secondary | ICD-10-CM | POA: Diagnosis not present

## 2023-12-27 DIAGNOSIS — Z9842 Cataract extraction status, left eye: Secondary | ICD-10-CM | POA: Diagnosis not present

## 2023-12-27 DIAGNOSIS — G9341 Metabolic encephalopathy: Secondary | ICD-10-CM | POA: Diagnosis not present

## 2023-12-27 DIAGNOSIS — I2510 Atherosclerotic heart disease of native coronary artery without angina pectoris: Secondary | ICD-10-CM | POA: Diagnosis not present

## 2023-12-27 DIAGNOSIS — I5032 Chronic diastolic (congestive) heart failure: Secondary | ICD-10-CM | POA: Diagnosis not present

## 2023-12-27 DIAGNOSIS — Z96622 Presence of left artificial elbow joint: Secondary | ICD-10-CM | POA: Diagnosis not present

## 2023-12-27 DIAGNOSIS — Z8673 Personal history of transient ischemic attack (TIA), and cerebral infarction without residual deficits: Secondary | ICD-10-CM | POA: Diagnosis not present

## 2023-12-27 DIAGNOSIS — I110 Hypertensive heart disease with heart failure: Secondary | ICD-10-CM | POA: Diagnosis not present

## 2023-12-27 DIAGNOSIS — G8929 Other chronic pain: Secondary | ICD-10-CM | POA: Diagnosis not present

## 2023-12-27 DIAGNOSIS — Z9981 Dependence on supplemental oxygen: Secondary | ICD-10-CM | POA: Diagnosis not present

## 2023-12-28 DIAGNOSIS — E559 Vitamin D deficiency, unspecified: Secondary | ICD-10-CM | POA: Diagnosis not present

## 2023-12-28 DIAGNOSIS — I480 Paroxysmal atrial fibrillation: Secondary | ICD-10-CM | POA: Diagnosis not present

## 2023-12-28 DIAGNOSIS — J9622 Acute and chronic respiratory failure with hypercapnia: Secondary | ICD-10-CM | POA: Diagnosis not present

## 2023-12-28 DIAGNOSIS — D649 Anemia, unspecified: Secondary | ICD-10-CM | POA: Diagnosis not present

## 2023-12-28 DIAGNOSIS — I110 Hypertensive heart disease with heart failure: Secondary | ICD-10-CM | POA: Diagnosis not present

## 2023-12-28 DIAGNOSIS — R911 Solitary pulmonary nodule: Secondary | ICD-10-CM | POA: Diagnosis not present

## 2023-12-28 DIAGNOSIS — J449 Chronic obstructive pulmonary disease, unspecified: Secondary | ICD-10-CM | POA: Diagnosis not present

## 2023-12-28 DIAGNOSIS — I5032 Chronic diastolic (congestive) heart failure: Secondary | ICD-10-CM | POA: Diagnosis not present

## 2023-12-28 DIAGNOSIS — R109 Unspecified abdominal pain: Secondary | ICD-10-CM | POA: Diagnosis not present

## 2023-12-28 DIAGNOSIS — R7301 Impaired fasting glucose: Secondary | ICD-10-CM | POA: Diagnosis not present

## 2023-12-28 DIAGNOSIS — I700 Atherosclerosis of aorta: Secondary | ICD-10-CM | POA: Diagnosis not present

## 2023-12-28 DIAGNOSIS — E785 Hyperlipidemia, unspecified: Secondary | ICD-10-CM | POA: Diagnosis not present

## 2024-01-01 DIAGNOSIS — K219 Gastro-esophageal reflux disease without esophagitis: Secondary | ICD-10-CM | POA: Diagnosis not present

## 2024-01-01 DIAGNOSIS — Z9842 Cataract extraction status, left eye: Secondary | ICD-10-CM | POA: Diagnosis not present

## 2024-01-01 DIAGNOSIS — Z87440 Personal history of urinary (tract) infections: Secondary | ICD-10-CM | POA: Diagnosis not present

## 2024-01-01 DIAGNOSIS — J9622 Acute and chronic respiratory failure with hypercapnia: Secondary | ICD-10-CM | POA: Diagnosis not present

## 2024-01-01 DIAGNOSIS — E7800 Pure hypercholesterolemia, unspecified: Secondary | ICD-10-CM | POA: Diagnosis not present

## 2024-01-01 DIAGNOSIS — J9621 Acute and chronic respiratory failure with hypoxia: Secondary | ICD-10-CM | POA: Diagnosis not present

## 2024-01-01 DIAGNOSIS — I2510 Atherosclerotic heart disease of native coronary artery without angina pectoris: Secondary | ICD-10-CM | POA: Diagnosis not present

## 2024-01-01 DIAGNOSIS — J441 Chronic obstructive pulmonary disease with (acute) exacerbation: Secondary | ICD-10-CM | POA: Diagnosis not present

## 2024-01-01 DIAGNOSIS — Z7951 Long term (current) use of inhaled steroids: Secondary | ICD-10-CM | POA: Diagnosis not present

## 2024-01-01 DIAGNOSIS — G9341 Metabolic encephalopathy: Secondary | ICD-10-CM | POA: Diagnosis not present

## 2024-01-01 DIAGNOSIS — Z9981 Dependence on supplemental oxygen: Secondary | ICD-10-CM | POA: Diagnosis not present

## 2024-01-01 DIAGNOSIS — G8929 Other chronic pain: Secondary | ICD-10-CM | POA: Diagnosis not present

## 2024-01-01 DIAGNOSIS — Z8673 Personal history of transient ischemic attack (TIA), and cerebral infarction without residual deficits: Secondary | ICD-10-CM | POA: Diagnosis not present

## 2024-01-01 DIAGNOSIS — I480 Paroxysmal atrial fibrillation: Secondary | ICD-10-CM | POA: Diagnosis not present

## 2024-01-01 DIAGNOSIS — Z96622 Presence of left artificial elbow joint: Secondary | ICD-10-CM | POA: Diagnosis not present

## 2024-01-01 DIAGNOSIS — I5032 Chronic diastolic (congestive) heart failure: Secondary | ICD-10-CM | POA: Diagnosis not present

## 2024-01-01 DIAGNOSIS — D539 Nutritional anemia, unspecified: Secondary | ICD-10-CM | POA: Diagnosis not present

## 2024-01-01 DIAGNOSIS — I110 Hypertensive heart disease with heart failure: Secondary | ICD-10-CM | POA: Diagnosis not present

## 2024-01-03 DIAGNOSIS — G8929 Other chronic pain: Secondary | ICD-10-CM | POA: Diagnosis not present

## 2024-01-03 DIAGNOSIS — Z9842 Cataract extraction status, left eye: Secondary | ICD-10-CM | POA: Diagnosis not present

## 2024-01-03 DIAGNOSIS — I5032 Chronic diastolic (congestive) heart failure: Secondary | ICD-10-CM | POA: Diagnosis not present

## 2024-01-03 DIAGNOSIS — I2510 Atherosclerotic heart disease of native coronary artery without angina pectoris: Secondary | ICD-10-CM | POA: Diagnosis not present

## 2024-01-03 DIAGNOSIS — I110 Hypertensive heart disease with heart failure: Secondary | ICD-10-CM | POA: Diagnosis not present

## 2024-01-03 DIAGNOSIS — Z8673 Personal history of transient ischemic attack (TIA), and cerebral infarction without residual deficits: Secondary | ICD-10-CM | POA: Diagnosis not present

## 2024-01-03 DIAGNOSIS — G9341 Metabolic encephalopathy: Secondary | ICD-10-CM | POA: Diagnosis not present

## 2024-01-03 DIAGNOSIS — J9621 Acute and chronic respiratory failure with hypoxia: Secondary | ICD-10-CM | POA: Diagnosis not present

## 2024-01-03 DIAGNOSIS — J441 Chronic obstructive pulmonary disease with (acute) exacerbation: Secondary | ICD-10-CM | POA: Diagnosis not present

## 2024-01-03 DIAGNOSIS — Z96622 Presence of left artificial elbow joint: Secondary | ICD-10-CM | POA: Diagnosis not present

## 2024-01-03 DIAGNOSIS — D539 Nutritional anemia, unspecified: Secondary | ICD-10-CM | POA: Diagnosis not present

## 2024-01-03 DIAGNOSIS — Z9981 Dependence on supplemental oxygen: Secondary | ICD-10-CM | POA: Diagnosis not present

## 2024-01-03 DIAGNOSIS — E7800 Pure hypercholesterolemia, unspecified: Secondary | ICD-10-CM | POA: Diagnosis not present

## 2024-01-03 DIAGNOSIS — J9622 Acute and chronic respiratory failure with hypercapnia: Secondary | ICD-10-CM | POA: Diagnosis not present

## 2024-01-03 DIAGNOSIS — Z7951 Long term (current) use of inhaled steroids: Secondary | ICD-10-CM | POA: Diagnosis not present

## 2024-01-03 DIAGNOSIS — K219 Gastro-esophageal reflux disease without esophagitis: Secondary | ICD-10-CM | POA: Diagnosis not present

## 2024-01-03 DIAGNOSIS — Z87440 Personal history of urinary (tract) infections: Secondary | ICD-10-CM | POA: Diagnosis not present

## 2024-01-03 DIAGNOSIS — I480 Paroxysmal atrial fibrillation: Secondary | ICD-10-CM | POA: Diagnosis not present

## 2024-01-07 NOTE — Progress Notes (Signed)
 [name], female    DOB: June 09, 1951    MRN: [id number]   Brief patient profile:  31 yowf  quit smoking 2014 and on 02 since 2009  referred to pulmonary clinic in [place]  07/26/2022 by Eboni for copd GOLD 4  / 02 dep  then admit:  Admit date: 06/29/2022 Discharge date: 07/07/2022 Admitted From: Home Disposition: SNF Recommendations for Outpatient Follow-up:  Please obtain BMP and CBC with differential in 1 week Outpatient follow-up with pulmonology about left lower lobe nodule Please follow up on the following pending results: None        Hospital course 73 year old F with PMH of COPD/chronic RF on 2 L, diastolic CHF, HTN, chronic back pain, urinary retention and anxiety presenting with progressive confusion and fever, and admitted for severe sepsis and acute metabolic encephalopathy due to acute pyelonephritis and E. coli bacteremia.  Patient was febrile to 101.7 with leukocytosis to 22 and hypotension to 90/41.  She was lethargic and had an AKI with creatinine of 1.3.  She also had elevated troponin to 902 that has trended up to 1114.  EKG without significant acute ischemic finding.  CT head negative.  CT chest abdomen and pelvis with contrast showed 7 mm LLL nodule, left pyelonephritis and minimal fullness of proximal ureter without obstruction.  Cultures obtained.  Foley catheter placed.  Patient was started on IV fluid and broad-spectrum antibiotics and admitted.  Cardiology consulted.    TTE with LVEF of 55 to 60%, G1 DD and moderate TVR.  Nuclear stress test with normal LV perfusion and no evidence of ischemia but poor quality due to patient's movement.    CTA chest PE protocol on 7/8 with increased clustered ill-defined GG nodularity in the superior segment of LLL and scattered tiny centrilobular nodules in both lungs consistent with infectious or inflammatory bronchiolitis and concern for aspiration pneumonia given the small amount of secretion in trachea.  MRI lumbar spine  without acute finding or osteomyelitis but multilevel spondylosis and moderate bilateral neuroforaminal stenosis at L5-S1.   Patient blood culture grew pansensitive E. coli.  Antibiotic de-escalated to IV Ancef .  She is discharged on p.o. cefadroxil  for 6 more days to complete treatment course for E. coli bacteremia and pyelonephritis.    Therapy recommended SNF.   See individual problem list below for more.    Problems addressed during this hospitalization Principal Problem:   Severe sepsis (HCC) Active Problems:   Urinary retention   COPD exacerbation (HCC)   Anxiety   Acute on chronic diastolic CHF (congestive heart failure) (HCC)   Troponin level elevated   Acute pyelonephritis   E coli bacteremia   Acute metabolic encephalopathy   Acute on chronic respiratory failure with hypoxia and hypercapnia (HCC)   Urinary tract infection   Chronic lower back pain   Pulmonary nodule   Obesity (BMI 30-39.9)   Normocytic anemia   Severe sepsis due to acute left pyelonephritis and E. coli bacteremia in the setting of urinary retention, and possible aspiration pneumonia: POA.  Patient had fever, leukocytosis, hypotension, AKI and encephalopathy on presentation.  Blood culture with pansensitive E. coli.  Imaging concerning for left pyelonephritis and left ureteral fullness without obstructing stone.  Imaging also raises concern about left lung pneumonia and possible aspiration. -7/5 Vanco and cefepime >> 7/6 ceftriaxone > 7/10 Ancef > 7/13-7/19 cefadroxil  -Doxycycline  7/11-7/15   Acute metabolic encephalopathy: Likely due to the above.  She is also at risk for polypharmacy due to pain medications other sedating medications.  Resolved. -Reorientation and delirium precautions -Minimize sedating medications   Possible aspiration pneumonia: CTA chest negative for PE but raises concern for LLL pneumonia with concern for aspiration pneumonia given fluid in trachea. -Low risk for aspiration per SLP.    Acute on chronic diastolic CHF: Evidence of fluid overload per exam by previous attending.  Likely in the setting of fluid resuscitation for sepsis.  TTE with normal LVEF, G1 DD and RVSP of 44.  BNP elevated but improved with diuretics.  Diuresed with IV Lasix .  Net -8.5 L.  Appears euvolemic on exam -P.o. Lasix  20 mg daily -Reassess BMP and fluid status at follow-up.   COPD exacerbation: Resolved. -Continue LABA/LAMA/ICS -As needed albuterol  -Minimum oxygen  to keep saturation above 90%   Acute on chronic hypoxic and hypercarbic respiratory failure/OSA not on CPAP: On 2 L at baseline.  -Minimum oxygen  to keep saturation above 90%.  She is high risk for CO2 retention   Acute kidney injury: Resolved. Recent Labs (within last 365 days)             Recent Labs    06/29/22 2057 06/30/22 0451 07/01/22 0207 07/02/22 0122 07/03/22 0221 07/04/22 0049 07/05/22 0231 07/06/22 0301 07/07/22 0035  BUN 26* 25* 30* 23 20 21 19 15 12   CREATININE 1.29* 1.12* 0.94 0.83 0.67 0.91 0.86 0.79 0.64    -Recheck BMP in 1 week   Atypical chest pain/elevated troponin: Troponin elevated to 900 and peaked at 1114.  Demand ischemia?  TTE as above.  Nuclear stress test negative but poor quality.  Chest pain could be due to pneumonia as well.  Symptoms resolved.  Cardiology signed off.  At this point, we can say non-STEMI ruled out. -Continue aspirin  and statin.   Essential hypertension: Normotensive off home Hyzaar. -Continue home Cardizem  CD -P.o. Lasix  20 mg daily   Urinary retention: History of this.  She had Foley placed in ED. -Continue Flomax  and bethanechol  -Passed voiding trial.   Chronic low back pain-lumbar MRI without acute finding but spondylolysis with moderate bilateral neuroforaminal stenosis at L5-S1. -Continue therapy at SNF -Continue home Norco but not a great choice with her respiratory issue.   Normocytic anemia: Stable. -Recheck CBC at follow-up   8 mm ground-glass nodule in  the left lower lobe.  PCP to arrange outpatient pulmonary follow-up within 1 to 2 weeks of discharge for follow-up and monitoring.   Obesity Body mass index is 32.26 kg/m.     History of Present Illness  07/26/2022  Pulmonary/ 1st office eval/ [name] / [place] [facility] home from NH x one week p above admit  Chief Complaint  Patient presents with   Consult    Consult for O2 dropping normally uses 2LO2 cont.   Dyspnea:  indoor walking room to room  Cough: no am flares/ sporadic daytime min mucoid production Sleep: on side bed is flat 2 pillows  SABA use: hfa 3-4 x daily  02:  2lpm hs and does not titrate daytime Rec Continue trelegy one click each am  - take two good drags then rinse gargle  Only use your albuterol  as a rescue medication Ok to try albuterol  15 min before an activity (on alternating days)  that you know would usually make you short of breath Make sure you check your oxygen  saturation  AT  your highest level of activity (not after you stop)   to be sure it stays over 90%    10/18/2022  f/u ov/[place] office/[name] re: GOLD 4 maint  on 02 24/7 and trelegy   Chief Complaint  Patient presents with   Follow-up    Doing well. Has concerns about a lung nodule mentioned from CT scan in July   Dyspnea:  walking more and planning walking outside, does not understand saba pre challenge or titration of 02 with exertion  Cough: much better  Sleeping: flat bed / 2 pillows  SABA use: hfa rarely / never neb  02: 2lpm hs up to 3lpm with sats 90 on 3lpm  Covid status: vax max  Rec Also  Ok to try albuterol  15 min before an activity (on alternating days)  that you know would usually make you short of breath  Make sure you check your oxygen  saturation  AT  your highest level of activity (not after you stop)   to be sure it stays over 90%    06/30/2023  f/u ov/[place] office/[name] re: GOLD 4/ 02 dep copd  maint on trelegy  downhill x months / some better p abx from Dr Shona for what  she says was a virus but no ongoing fever, chills but new R flank pain x months no pleuritic quality  Chief Complaint  Patient presents with   Follow-up  Dyspnea:  room to room at home 3lpm continuous Cough:  min rattle > clear mucus only  Sleeping: flat bed  2 pillows s resp cc  SABA use: 3 x daily only increases  after 02: 3lpm 24/7 Rec Make sure you check your oxygen  saturation  AT  your highest level of activity (not after you stop)   to be sure it stays over 90%   My office will be contacting you by phone for referral to [facility] for best fit for ambulatory 0xygen  Please schedule a follow up office visit in 6 weeks, call sooner if needed with all medications /inhalers/ solutions in hand     Admit date: 07/19/2023 Discharge date: 07/24/2023   Discharge Diagnoses:  Principal Problem:   Acute respiratory failure with hypoxia and hypercapnia (HCC)   COPD with acute exacerbation (HCC)   Chronic diastolic CHF (congestive heart failure) (HCC)   Leukocytosis   Elevated troponin   Essential hypertension   History of UTI   Anxiety        History of present illness:  From admission h and p th medical history significant of hypertension, hyperlipidemia, unspecified CHF, COPD on home oxygen  3-4L, prior history of E. coli bacteremia, anxiety, urinary retention, and tobacco abuse who presented with acutely worsening shortness of breath and weakness.   Recently admitted in the hospital from 7/17-7/19 after having a fall at home with trauma to her head.  Noted to have acute metabolic encephalopathy which was thought secondary to fall with head trauma and had also been found to have concern for urinary tract infection growing E. coli for which patient received 2 days of Rocephin  and was discharged home on nitrofurantoin .  She reported completing course of nitrofurantoin  as prescribed.  Last night patient reported having progressively worsening shortness of breath yesterday evening.  Reported  having associated symptoms of subjective fever, wheezing, substernal chest pain, and weakness for which she was unable to get up to go to the bathroom.      On EMS  arrival patient noted to have O2 saturations of 67% on 3 L placed on a nonrebreather at 15 L with improvement in O2 saturations up to mid 90s.  Patient had received 2 DuoNeb breathing treatments and Solu-Medrol  125 mg  IV.      In the emergency department patient was noted to be afebrile, respirations 15-28, blood pressures elevated up to 179/93, and O2 saturations currently maintained on 3 L of nasal cannula oxygen .  Labs significant for WBC 13, hemoglobin 11, CO2 39, glucose 152, calcium  8.7, BNP 425.4, high-sensitivity troponin 36->32. Venous blood gas significant for pH 7.4, pCO2 83.8, and O2 95.  Chest x-ray noted similar appearance with increased interstitial prominence and diffuse peribronchial cuffing suggestive of background acute bronchitis.  Patient has been given breathing treatments, magnesium  sulfate 2 g IV, Lasix  40 mg IV, and fentanyl  50 mcg IV.     Hospital Course:  Patient was treated with acute exacerbation of copd complicated by acute on chronic hypoxic hypercarbic respiratory failure. Treated with IV steroids and ceftriaxone /azithromycin . Symptomatically much improved. Weaned down to home 3 liters at rest. PT advised SNF but patient declines, will order home health PT/OT instead. Patient ambulated unassisted day of discharge and deems herself close to her baseline, requests discharge today.  Other chronic conditions stable. Will discharge home with oral steroid taper and close pcp/pulm f/u.       08/14/2023  f/u ov/[place] office/[name] re: GOLD 4 COPD/02 dep  maint on trelegy 100 but  did not  bring meds  Chief Complaint  Patient presents with   COPD    Gold 4  Dyspnea:  best ever felt on prednisone  x  5  days p discharge and slowly downhill since/ does not recall taking macrodantin  Cough: none  Sleeping: flat bed  2 pillows no resp cc  SABA use: tid inhalation / neb  02: 3.5 conc at hs  and daily  on 4 lpm POC out  Rec Plan A = Automatic = Always=    Trelegy 100 one click each  tug hard to get it going and then full dep breath  Plan B = Backup (to supplement plan A, not to replace it) Only use your albuterol  inhaler as a rescue medication Plan C = Crisis (instead of Plan B but only if Plan B stops working) - only use your albuterol  nebulizer if you first try Plan B Also  Ok to try albuterol  15 min before an activity (on alternating days)  that you know would usually make you short of breath  Make sure you check your oxygen  saturation  AT  your highest level of activity (not after you stop)   to be sure it stays over 90% and adjust  02 flow upward to maintain this level if needed but remember to turn it back to previous settings when you stop (to conserve your supply).  Cipro  500 mg twice daily x 10 day   Prednisone  10 mg take  4 each am x 2 days,   2 each am x 2 days,  1 each am x 2 days and stop   Please schedule a follow up office visit in 6 weeks, call sooner if needed with all medications /inhalers/ solutions in hand  Add:  My office will be contacting you by phone for referral to ambulatory 02 titration for BEST FIT for portable 02      Admit date: 11/12/2023 Discharge date: 11/17/2023 Home Health: None Equipment/Devices: BiPAP     Brief/Interim Summary: 73 year old female patient followed by Dr. Darlean for chronic respiratory failure in the setting of Gold 4 COPD typically maintained on 2 L nasal cannula With baseline exertional dyspnea noted last seen as a hospital follow-up on August 14, 2023 following  hospitalization for sepsis from urinary tract infection, complicated further by exacerbation of her underlying lung disease, just discharged once again on 10/22 after being hospitalized for acute hypercarbic respiratory failure/pneumonia/heart failure.  11/12 patient reported worsening respiratory  symptoms in the office with notable hypoxia on baseline oxygen  at 3.5 L.  Family called EMS on 11/17 patient was found to be poorly responsive and confused -admitted to ICU initially requiring intubation.   Patient admitted as above with acute respiratory failure requiring intubation due to COPD exacerbation.  Pneumonia has been ruled out at this time.  Patient improved drastically with supportive care, weaned off ventilator on the 19th and has been back to baseline oxygen  needs for the past 24 hours.  She is otherwise stable and agreeable for discharge home, close follow-up with PCP and pulmonology as scheduled over the next 1 to 2 weeks.   Discharge Diagnoses:  Principal Problem:   Acute on chronic respiratory failure (HCC)   Acute on chronic hypoxic and hypercarbic respiratory failure secondary to acute exacerbation of chronic obstructive pulmonary disease Community-acquired pneumonia ruled out Successfully extubated 11/19 Currently on home oxygen  at 3.5 L, ambulatory walk screen successful without any hypoxia Pulmonology team recommending BiPAP at night, TOC able to obtain device for discharge Steroid course completed Concurrent metabolic alkalosis/hypochloremia appear to be at baseline(consistent with labs over the past year)   Acute metabolic/hypercarbic encephalopathy  Resolved, patient is alert, awake and following commands   Chronic HFpEF without acute exacerbation HTN CAD Continue home medications including furosemide , aspirin , atorvastatin , and Lopressor    Recurrent UTIs Continue Urecholine  and Flomax    Hypokalemia, resolved   Anxiety and depression Chronic pain issues Discontinue alprazolam  at discharge given respiratory status -Would recommend weaning off narcotics as well Continue Lexapro    01/09/2024  Post hosp f/u  ov/[place] office/[name] re: COPD gold 4  maint on no rx   Chief Complaint  Patient presents with   Hospitalization Follow-up    Sent home on NIV    Shortness of Breath  Dyspnea:  OFF prednisone  no change room to room / only leaves home to go to doctor Cough: flutter valve white mucus thick  Sleeping: flat bed / 2 pillows and  NIV usually > 6 hours sleeping much better  SABA use: 3 x day hfa/ on duoneb once in am  02: 3.5   hs and  4lpm POC    No obvious day to day or daytime variability or assoc mucus plugs or hemoptysis or cp or chest tightness, subjective wheeze or overt sinus or hb symptoms.    Also denies any obvious fluctuation of symptoms with weather or environmental changes or other aggravating or alleviating factors except as outlined above   No unusual exposure hx or h/o childhood pna/ asthma or knowledge of premature birth.  Current Allergies, Complete Past Medical History, Past Surgical History, Family History, and Social History were reviewed in [employer] record.  ROS  The following are not active complaints unless bolded Hoarseness, sore throat, dysphagia, dental problems, itching, sneezing,  nasal congestion or discharge of excess mucus or purulent secretions, ear ache,   fever, chills, sweats, unintended wt loss or wt gain, classically pleuritic or exertional cp,  orthopnea pnd or arm/hand swelling  or leg swelling, presyncope, palpitations, abdominal pain, anorexia, nausea, vomiting, diarrhea  or change in bowel habits or change in bladder habits, change in stools or change in urine, dysuria, hematuria,  rash, arthralgias, visual complaints, headache, numbness, weakness or ataxia or problems  with walking/w/c to go out  or coordination,  change in mood or  memory.        Current Meds  Medication Sig   albuterol  (VENTOLIN  HFA) 108 (90 Base) MCG/ACT inhaler Inhale 2 puffs into the lungs every 4 (four) hours as needed for wheezing or shortness of breath. For shortness of breath   ALPRAZolam  (XANAX ) 0.25 MG tablet Take 0.25 mg by mouth 2 (two) times daily as needed.   aspirin  EC 81 MG tablet Take 81  mg by mouth daily.   atorvastatin  (LIPITOR ) 80 MG tablet Take 1 tablet (80 mg total) by mouth daily.   bethanechol  (URECHOLINE ) 10 MG tablet Take 2 tablets (20 mg total) by mouth daily.   budesonide  (PULMICORT ) 0.25 MG/2ML nebulizer solution Take 2 mLs (0.25 mg total) by nebulization 2 (two) times daily. (Patient taking differently: Take 0.25 mg by nebulization 2 (two) times daily as needed (for shortness of breath).)   furosemide  (LASIX ) 20 MG tablet Take 1 tablet (20 mg total) by mouth daily.   HYDROcodone -acetaminophen  (NORCO) 10-325 MG tablet Take 1 tablet by mouth every 6 (six) hours as needed for moderate pain (pain score 4-6). 1 tablet every 3-4 hours as needed for pain.   ipratropium-albuterol  (DUONEB) 0.5-2.5 (3) MG/3ML SOLN Take 3 mLs by nebulization in the morning, at noon, in the evening, and at bedtime. (Patient taking differently: Take 3 mLs by nebulization every 6 (six) hours as needed (For shortness of breath).)   metoprolol  tartrate (LOPRESSOR ) 25 MG tablet Take 1 tablet (25 mg total) by mouth 2 (two) times daily.   mirtazapine  (REMERON ) 7.5 MG tablet Take 7.5 mg by mouth at bedtime.   Multiple Vitamins-Minerals (CENTRUM ADULT PO) Take 1 tablet by mouth daily.   pantoprazole  (PROTONIX ) 20 MG tablet Take 1 tablet (20 mg total) by mouth daily.   tamsulosin  (FLOMAX ) 0.4 MG CAPS capsule Take 1 capsule (0.4 mg total) by mouth daily.           Past Medical History:  Diagnosis Date   Anxiety    Atrophic vaginitis 10/09/2015   Baker's cyst    left leg   Blood in urine 10/09/2015   CHF (congestive heart failure) (HCC)    COPD (chronic obstructive pulmonary disease) (HCC)    Heart murmur    Hematuria 10/09/2015   Hypertension    Nicotine  addiction 04/07/2014   Pneumonia    Urinary urgency 01/25/2016   Vaginal bleeding 10/09/2015      Objective:    Wts  01/09/2024        172  08/14/2023        185  06/30/2023          180  10/18/22 175 lb 12.8 oz (79.7 kg)  08/08/22 170 lb  (77.1 kg)  07/26/22 170 lb 3.2 oz (77.2 kg)   Vital signs reviewed  01/09/2024  - Note at rest 02 sats  94% on 4lpm POC  General appearance:    w/c bound  eldelry wf, nad    HEENT :  Oropharynx  clear      NECK :  without JVD/Nodes/TM/ nl carotid upstrokes bilaterally   LUNGS: no acc muscle use,  Mod barrel  contour chest wall with bilateral  Distant exp wheeze and  without cough on insp or exp maneuvers and mod  Hyperresonant  to  percussion bilaterally     CV:  RRR  no s3 or murmur or increase in P2, and no edema   ABD:  obese soft and nontender   MS:   Ext warm without deformities or   obvious joint restrictions , calf tenderness, cyanosis or clubbing  SKIN: warm and dry without lesions    NEURO:  alert, approp, nl sensorium with  no motor or cerebellar deficits apparent.                       Assessment

## 2024-01-08 ENCOUNTER — Inpatient Hospital Stay: Payer: Medicare Other | Admitting: Internal Medicine

## 2024-01-08 ENCOUNTER — Telehealth: Payer: Self-pay | Admitting: Internal Medicine

## 2024-01-08 NOTE — Telephone Encounter (Signed)
 [name] from [facility] states that they received a Denial letter from [facility] for Ventilator. There are a few options:   1.Alternative Therapy 2.Peer to Peer review with [employer] 3. Can start an appeal process with patient for the denial

## 2024-01-08 NOTE — Telephone Encounter (Addendum)
 Spoke with Shabana/[employer]; she states [employer] has returned a denial for the vent. She shows no documentation of my conversation with rep on 11/30/23 where we were advised it had been approved. She states patient has already received the vent and reports using it daily. She has requested download from [employer], as they are managing the machine per contract with [employer]. She recommends P2P review once patient has been seen in clinic.    Patient scheduled for OV tomorrow, 12/08/24. Will discuss then.

## 2024-01-09 ENCOUNTER — Encounter: Payer: Self-pay | Admitting: Internal Medicine

## 2024-01-09 ENCOUNTER — Ambulatory Visit: Payer: Medicare Other | Admitting: Internal Medicine

## 2024-01-09 VITALS — BP 153/76 | HR 82 | Ht 63.0 in | Wt 172.0 lb

## 2024-01-09 DIAGNOSIS — J449 Chronic obstructive pulmonary disease, unspecified: Secondary | ICD-10-CM

## 2024-01-09 DIAGNOSIS — J9612 Chronic respiratory failure with hypercapnia: Secondary | ICD-10-CM | POA: Diagnosis not present

## 2024-01-09 DIAGNOSIS — J9611 Chronic respiratory failure with hypoxia: Secondary | ICD-10-CM | POA: Diagnosis not present

## 2024-01-09 NOTE — Telephone Encounter (Signed)
 Lequita Halt from [employer] is returning phone call. Lequita Halt phone number is [phone].

## 2024-01-09 NOTE — Telephone Encounter (Signed)
 Spoke with Lequita Halt, she is the RT assigned to patient. She is going to send usage/data report. She is also going to send message to billing office to see if someone can assist [place] with the insurance issue.

## 2024-01-09 NOTE — Patient Instructions (Addendum)
 Restart your Trelegy 100  one click each am   I will see if we can get you approved for the ventilator and information from [employer] re settings   Please schedule a follow up office visit in 6 weeks, call sooner if needed with your  oxygen  meter and your inhalation medications/ solutions/ inhalers

## 2024-01-09 NOTE — Assessment & Plan Note (Signed)
 Quit smoking 2014  - PFT's  08/19/16  FEV1 0.54 (22 % ) ratio 0.43  p 1 % improvement from saba p Incruse prior to study with DLCO  10.53  (49%) and  FV curve classically concave    - HC03  12/16/23  =  43 - Placed on NIV at d/c  (see resp failure)  with HC03  36 11/17/23    Group D (now reclassified as E) in terms of symptom/risk and laba/lama/ICS  therefore appropriate rx at this point >>>  restart trelegy plus approp saba:  Re SABA :  I spent extra time with pt today reviewing appropriate use of albuterol  for prn use on exertion with the following points: 1) saba is for relief of sob that does not improve by walking a slower pace or resting but rather if the pt does not improve after trying this first. 2) If the pt is convinced, as many are, that saba helps recover from activity faster then it's easy to tell if this is the case by re-challenging : ie stop, take the inhaler, then p 5 minutes try the exact same activity (intensity of workload) that just caused the symptoms and see if they are substantially diminished or not after saba 3) if there is an activity that reproducibly causes the symptoms, try the saba 15 min before the activity on alternate days   If in fact the saba really does help, then fine to continue to use it prn but advised may need to look closer at the maintenance regimen being used to achieve better control of airways disease with exertion.

## 2024-01-10 DIAGNOSIS — I110 Hypertensive heart disease with heart failure: Secondary | ICD-10-CM | POA: Diagnosis not present

## 2024-01-10 DIAGNOSIS — Z9981 Dependence on supplemental oxygen: Secondary | ICD-10-CM | POA: Diagnosis not present

## 2024-01-10 DIAGNOSIS — G9341 Metabolic encephalopathy: Secondary | ICD-10-CM | POA: Diagnosis not present

## 2024-01-10 DIAGNOSIS — J441 Chronic obstructive pulmonary disease with (acute) exacerbation: Secondary | ICD-10-CM | POA: Diagnosis not present

## 2024-01-10 DIAGNOSIS — I5032 Chronic diastolic (congestive) heart failure: Secondary | ICD-10-CM | POA: Diagnosis not present

## 2024-01-10 DIAGNOSIS — Z96622 Presence of left artificial elbow joint: Secondary | ICD-10-CM | POA: Diagnosis not present

## 2024-01-10 DIAGNOSIS — I480 Paroxysmal atrial fibrillation: Secondary | ICD-10-CM | POA: Diagnosis not present

## 2024-01-10 DIAGNOSIS — G8929 Other chronic pain: Secondary | ICD-10-CM | POA: Diagnosis not present

## 2024-01-10 DIAGNOSIS — E7800 Pure hypercholesterolemia, unspecified: Secondary | ICD-10-CM | POA: Diagnosis not present

## 2024-01-10 DIAGNOSIS — J9622 Acute and chronic respiratory failure with hypercapnia: Secondary | ICD-10-CM | POA: Diagnosis not present

## 2024-01-10 DIAGNOSIS — I2510 Atherosclerotic heart disease of native coronary artery without angina pectoris: Secondary | ICD-10-CM | POA: Diagnosis not present

## 2024-01-10 DIAGNOSIS — D539 Nutritional anemia, unspecified: Secondary | ICD-10-CM | POA: Diagnosis not present

## 2024-01-10 DIAGNOSIS — Z7951 Long term (current) use of inhaled steroids: Secondary | ICD-10-CM | POA: Diagnosis not present

## 2024-01-10 DIAGNOSIS — Z87440 Personal history of urinary (tract) infections: Secondary | ICD-10-CM | POA: Diagnosis not present

## 2024-01-10 DIAGNOSIS — K219 Gastro-esophageal reflux disease without esophagitis: Secondary | ICD-10-CM | POA: Diagnosis not present

## 2024-01-10 DIAGNOSIS — J9621 Acute and chronic respiratory failure with hypoxia: Secondary | ICD-10-CM | POA: Diagnosis not present

## 2024-01-10 DIAGNOSIS — Z8673 Personal history of transient ischemic attack (TIA), and cerebral infarction without residual deficits: Secondary | ICD-10-CM | POA: Diagnosis not present

## 2024-01-10 DIAGNOSIS — Z9842 Cataract extraction status, left eye: Secondary | ICD-10-CM | POA: Diagnosis not present

## 2024-01-10 NOTE — Assessment & Plan Note (Addendum)
Placed on NIV at d/c with HC03  32 on  11/17/23 c/w pc02 around 53 by the [facility] equation and assuming the pH in stable state is around 7.40   using H + = 24 x pC0/HC03   She is using NIV and benefiting from it   Download pending   Advised to tritrate 02 daytime to sats around 90%, no higher needed in this setting        Each maintenance medication was reviewed in detail including emphasizing most importantly the difference between maintenance and prns and under what circumstances the prns are to be triggered using an action plan format where appropriate.  Total time for H and P, chart review, counseling, reviewing NIV/hfa/dpi device(s) and generating customized AVS unique to this office visit / same day charting  > 30 min post hosp

## 2024-01-11 DIAGNOSIS — J441 Chronic obstructive pulmonary disease with (acute) exacerbation: Secondary | ICD-10-CM | POA: Diagnosis not present

## 2024-01-11 DIAGNOSIS — I5032 Chronic diastolic (congestive) heart failure: Secondary | ICD-10-CM | POA: Diagnosis not present

## 2024-01-11 DIAGNOSIS — Z8673 Personal history of transient ischemic attack (TIA), and cerebral infarction without residual deficits: Secondary | ICD-10-CM | POA: Diagnosis not present

## 2024-01-11 DIAGNOSIS — I110 Hypertensive heart disease with heart failure: Secondary | ICD-10-CM | POA: Diagnosis not present

## 2024-01-11 DIAGNOSIS — Z9981 Dependence on supplemental oxygen: Secondary | ICD-10-CM | POA: Diagnosis not present

## 2024-01-11 DIAGNOSIS — K219 Gastro-esophageal reflux disease without esophagitis: Secondary | ICD-10-CM | POA: Diagnosis not present

## 2024-01-11 DIAGNOSIS — Z96622 Presence of left artificial elbow joint: Secondary | ICD-10-CM | POA: Diagnosis not present

## 2024-01-11 DIAGNOSIS — I2510 Atherosclerotic heart disease of native coronary artery without angina pectoris: Secondary | ICD-10-CM | POA: Diagnosis not present

## 2024-01-11 DIAGNOSIS — Z9842 Cataract extraction status, left eye: Secondary | ICD-10-CM | POA: Diagnosis not present

## 2024-01-11 DIAGNOSIS — G9341 Metabolic encephalopathy: Secondary | ICD-10-CM | POA: Diagnosis not present

## 2024-01-11 DIAGNOSIS — Z7951 Long term (current) use of inhaled steroids: Secondary | ICD-10-CM | POA: Diagnosis not present

## 2024-01-11 DIAGNOSIS — D539 Nutritional anemia, unspecified: Secondary | ICD-10-CM | POA: Diagnosis not present

## 2024-01-11 DIAGNOSIS — G8929 Other chronic pain: Secondary | ICD-10-CM | POA: Diagnosis not present

## 2024-01-11 DIAGNOSIS — J9622 Acute and chronic respiratory failure with hypercapnia: Secondary | ICD-10-CM | POA: Diagnosis not present

## 2024-01-11 DIAGNOSIS — E7800 Pure hypercholesterolemia, unspecified: Secondary | ICD-10-CM | POA: Diagnosis not present

## 2024-01-11 DIAGNOSIS — I480 Paroxysmal atrial fibrillation: Secondary | ICD-10-CM | POA: Diagnosis not present

## 2024-01-11 DIAGNOSIS — J9621 Acute and chronic respiratory failure with hypoxia: Secondary | ICD-10-CM | POA: Diagnosis not present

## 2024-01-11 DIAGNOSIS — Z87440 Personal history of urinary (tract) infections: Secondary | ICD-10-CM | POA: Diagnosis not present

## 2024-01-15 DIAGNOSIS — K219 Gastro-esophageal reflux disease without esophagitis: Secondary | ICD-10-CM | POA: Diagnosis not present

## 2024-01-15 DIAGNOSIS — J441 Chronic obstructive pulmonary disease with (acute) exacerbation: Secondary | ICD-10-CM | POA: Diagnosis not present

## 2024-01-15 DIAGNOSIS — I480 Paroxysmal atrial fibrillation: Secondary | ICD-10-CM | POA: Diagnosis not present

## 2024-01-15 DIAGNOSIS — Z9981 Dependence on supplemental oxygen: Secondary | ICD-10-CM | POA: Diagnosis not present

## 2024-01-15 DIAGNOSIS — Z7951 Long term (current) use of inhaled steroids: Secondary | ICD-10-CM | POA: Diagnosis not present

## 2024-01-15 DIAGNOSIS — J9621 Acute and chronic respiratory failure with hypoxia: Secondary | ICD-10-CM | POA: Diagnosis not present

## 2024-01-15 DIAGNOSIS — G8929 Other chronic pain: Secondary | ICD-10-CM | POA: Diagnosis not present

## 2024-01-15 DIAGNOSIS — I2510 Atherosclerotic heart disease of native coronary artery without angina pectoris: Secondary | ICD-10-CM | POA: Diagnosis not present

## 2024-01-15 DIAGNOSIS — E7800 Pure hypercholesterolemia, unspecified: Secondary | ICD-10-CM | POA: Diagnosis not present

## 2024-01-15 DIAGNOSIS — Z96622 Presence of left artificial elbow joint: Secondary | ICD-10-CM | POA: Diagnosis not present

## 2024-01-15 DIAGNOSIS — Z8673 Personal history of transient ischemic attack (TIA), and cerebral infarction without residual deficits: Secondary | ICD-10-CM | POA: Diagnosis not present

## 2024-01-15 DIAGNOSIS — J9622 Acute and chronic respiratory failure with hypercapnia: Secondary | ICD-10-CM | POA: Diagnosis not present

## 2024-01-15 DIAGNOSIS — I110 Hypertensive heart disease with heart failure: Secondary | ICD-10-CM | POA: Diagnosis not present

## 2024-01-15 DIAGNOSIS — D539 Nutritional anemia, unspecified: Secondary | ICD-10-CM | POA: Diagnosis not present

## 2024-01-15 DIAGNOSIS — Z9842 Cataract extraction status, left eye: Secondary | ICD-10-CM | POA: Diagnosis not present

## 2024-01-15 DIAGNOSIS — G9341 Metabolic encephalopathy: Secondary | ICD-10-CM | POA: Diagnosis not present

## 2024-01-15 DIAGNOSIS — Z87440 Personal history of urinary (tract) infections: Secondary | ICD-10-CM | POA: Diagnosis not present

## 2024-01-15 DIAGNOSIS — I5032 Chronic diastolic (congestive) heart failure: Secondary | ICD-10-CM | POA: Diagnosis not present

## 2024-01-17 DIAGNOSIS — Z96622 Presence of left artificial elbow joint: Secondary | ICD-10-CM | POA: Diagnosis not present

## 2024-01-17 DIAGNOSIS — G8929 Other chronic pain: Secondary | ICD-10-CM | POA: Diagnosis not present

## 2024-01-17 DIAGNOSIS — G9341 Metabolic encephalopathy: Secondary | ICD-10-CM | POA: Diagnosis not present

## 2024-01-17 DIAGNOSIS — I2510 Atherosclerotic heart disease of native coronary artery without angina pectoris: Secondary | ICD-10-CM | POA: Diagnosis not present

## 2024-01-17 DIAGNOSIS — J441 Chronic obstructive pulmonary disease with (acute) exacerbation: Secondary | ICD-10-CM | POA: Diagnosis not present

## 2024-01-17 DIAGNOSIS — Z7951 Long term (current) use of inhaled steroids: Secondary | ICD-10-CM | POA: Diagnosis not present

## 2024-01-17 DIAGNOSIS — Z9842 Cataract extraction status, left eye: Secondary | ICD-10-CM | POA: Diagnosis not present

## 2024-01-17 DIAGNOSIS — I110 Hypertensive heart disease with heart failure: Secondary | ICD-10-CM | POA: Diagnosis not present

## 2024-01-17 DIAGNOSIS — I5032 Chronic diastolic (congestive) heart failure: Secondary | ICD-10-CM | POA: Diagnosis not present

## 2024-01-17 DIAGNOSIS — D539 Nutritional anemia, unspecified: Secondary | ICD-10-CM | POA: Diagnosis not present

## 2024-01-17 DIAGNOSIS — J9622 Acute and chronic respiratory failure with hypercapnia: Secondary | ICD-10-CM | POA: Diagnosis not present

## 2024-01-17 DIAGNOSIS — Z87440 Personal history of urinary (tract) infections: Secondary | ICD-10-CM | POA: Diagnosis not present

## 2024-01-17 DIAGNOSIS — K219 Gastro-esophageal reflux disease without esophagitis: Secondary | ICD-10-CM | POA: Diagnosis not present

## 2024-01-17 DIAGNOSIS — Z9981 Dependence on supplemental oxygen: Secondary | ICD-10-CM | POA: Diagnosis not present

## 2024-01-17 DIAGNOSIS — J9621 Acute and chronic respiratory failure with hypoxia: Secondary | ICD-10-CM | POA: Diagnosis not present

## 2024-01-17 DIAGNOSIS — Z8673 Personal history of transient ischemic attack (TIA), and cerebral infarction without residual deficits: Secondary | ICD-10-CM | POA: Diagnosis not present

## 2024-01-17 DIAGNOSIS — I480 Paroxysmal atrial fibrillation: Secondary | ICD-10-CM | POA: Diagnosis not present

## 2024-01-17 DIAGNOSIS — E7800 Pure hypercholesterolemia, unspecified: Secondary | ICD-10-CM | POA: Diagnosis not present

## 2024-01-22 DIAGNOSIS — E7800 Pure hypercholesterolemia, unspecified: Secondary | ICD-10-CM | POA: Diagnosis not present

## 2024-01-22 DIAGNOSIS — Z9981 Dependence on supplemental oxygen: Secondary | ICD-10-CM | POA: Diagnosis not present

## 2024-01-22 DIAGNOSIS — J441 Chronic obstructive pulmonary disease with (acute) exacerbation: Secondary | ICD-10-CM | POA: Diagnosis not present

## 2024-01-22 DIAGNOSIS — K219 Gastro-esophageal reflux disease without esophagitis: Secondary | ICD-10-CM | POA: Diagnosis not present

## 2024-01-22 DIAGNOSIS — G9341 Metabolic encephalopathy: Secondary | ICD-10-CM | POA: Diagnosis not present

## 2024-01-22 DIAGNOSIS — Z7951 Long term (current) use of inhaled steroids: Secondary | ICD-10-CM | POA: Diagnosis not present

## 2024-01-22 DIAGNOSIS — Z96622 Presence of left artificial elbow joint: Secondary | ICD-10-CM | POA: Diagnosis not present

## 2024-01-22 DIAGNOSIS — I2510 Atherosclerotic heart disease of native coronary artery without angina pectoris: Secondary | ICD-10-CM | POA: Diagnosis not present

## 2024-01-22 DIAGNOSIS — I5032 Chronic diastolic (congestive) heart failure: Secondary | ICD-10-CM | POA: Diagnosis not present

## 2024-01-22 DIAGNOSIS — Z87440 Personal history of urinary (tract) infections: Secondary | ICD-10-CM | POA: Diagnosis not present

## 2024-01-22 DIAGNOSIS — J9621 Acute and chronic respiratory failure with hypoxia: Secondary | ICD-10-CM | POA: Diagnosis not present

## 2024-01-22 DIAGNOSIS — Z9842 Cataract extraction status, left eye: Secondary | ICD-10-CM | POA: Diagnosis not present

## 2024-01-22 DIAGNOSIS — D539 Nutritional anemia, unspecified: Secondary | ICD-10-CM | POA: Diagnosis not present

## 2024-01-22 DIAGNOSIS — J9622 Acute and chronic respiratory failure with hypercapnia: Secondary | ICD-10-CM | POA: Diagnosis not present

## 2024-01-22 DIAGNOSIS — Z8673 Personal history of transient ischemic attack (TIA), and cerebral infarction without residual deficits: Secondary | ICD-10-CM | POA: Diagnosis not present

## 2024-01-22 DIAGNOSIS — G8929 Other chronic pain: Secondary | ICD-10-CM | POA: Diagnosis not present

## 2024-01-22 DIAGNOSIS — I480 Paroxysmal atrial fibrillation: Secondary | ICD-10-CM | POA: Diagnosis not present

## 2024-01-22 DIAGNOSIS — I110 Hypertensive heart disease with heart failure: Secondary | ICD-10-CM | POA: Diagnosis not present

## 2024-01-22 NOTE — Telephone Encounter (Signed)
French Ana from [place] is calling in regards to this patient. [phone]

## 2024-01-24 DIAGNOSIS — G8929 Other chronic pain: Secondary | ICD-10-CM | POA: Diagnosis not present

## 2024-01-24 DIAGNOSIS — I110 Hypertensive heart disease with heart failure: Secondary | ICD-10-CM | POA: Diagnosis not present

## 2024-01-24 DIAGNOSIS — Z9981 Dependence on supplemental oxygen: Secondary | ICD-10-CM | POA: Diagnosis not present

## 2024-01-24 DIAGNOSIS — I480 Paroxysmal atrial fibrillation: Secondary | ICD-10-CM | POA: Diagnosis not present

## 2024-01-24 DIAGNOSIS — Z7951 Long term (current) use of inhaled steroids: Secondary | ICD-10-CM | POA: Diagnosis not present

## 2024-01-24 DIAGNOSIS — E7800 Pure hypercholesterolemia, unspecified: Secondary | ICD-10-CM | POA: Diagnosis not present

## 2024-01-24 DIAGNOSIS — I2510 Atherosclerotic heart disease of native coronary artery without angina pectoris: Secondary | ICD-10-CM | POA: Diagnosis not present

## 2024-01-24 DIAGNOSIS — I5032 Chronic diastolic (congestive) heart failure: Secondary | ICD-10-CM | POA: Diagnosis not present

## 2024-01-24 DIAGNOSIS — Z9842 Cataract extraction status, left eye: Secondary | ICD-10-CM | POA: Diagnosis not present

## 2024-01-24 DIAGNOSIS — Z96622 Presence of left artificial elbow joint: Secondary | ICD-10-CM | POA: Diagnosis not present

## 2024-01-24 DIAGNOSIS — Z87440 Personal history of urinary (tract) infections: Secondary | ICD-10-CM | POA: Diagnosis not present

## 2024-01-24 DIAGNOSIS — J9622 Acute and chronic respiratory failure with hypercapnia: Secondary | ICD-10-CM | POA: Diagnosis not present

## 2024-01-24 DIAGNOSIS — K219 Gastro-esophageal reflux disease without esophagitis: Secondary | ICD-10-CM | POA: Diagnosis not present

## 2024-01-24 DIAGNOSIS — D539 Nutritional anemia, unspecified: Secondary | ICD-10-CM | POA: Diagnosis not present

## 2024-01-24 DIAGNOSIS — J441 Chronic obstructive pulmonary disease with (acute) exacerbation: Secondary | ICD-10-CM | POA: Diagnosis not present

## 2024-01-24 DIAGNOSIS — Z8673 Personal history of transient ischemic attack (TIA), and cerebral infarction without residual deficits: Secondary | ICD-10-CM | POA: Diagnosis not present

## 2024-01-24 DIAGNOSIS — J9621 Acute and chronic respiratory failure with hypoxia: Secondary | ICD-10-CM | POA: Diagnosis not present

## 2024-01-24 DIAGNOSIS — G9341 Metabolic encephalopathy: Secondary | ICD-10-CM | POA: Diagnosis not present

## 2024-01-30 DIAGNOSIS — Z9981 Dependence on supplemental oxygen: Secondary | ICD-10-CM | POA: Diagnosis not present

## 2024-01-30 DIAGNOSIS — I2510 Atherosclerotic heart disease of native coronary artery without angina pectoris: Secondary | ICD-10-CM | POA: Diagnosis not present

## 2024-01-30 DIAGNOSIS — I5032 Chronic diastolic (congestive) heart failure: Secondary | ICD-10-CM | POA: Diagnosis not present

## 2024-01-30 DIAGNOSIS — J9622 Acute and chronic respiratory failure with hypercapnia: Secondary | ICD-10-CM | POA: Diagnosis not present

## 2024-01-30 DIAGNOSIS — G9341 Metabolic encephalopathy: Secondary | ICD-10-CM | POA: Diagnosis not present

## 2024-01-30 DIAGNOSIS — K219 Gastro-esophageal reflux disease without esophagitis: Secondary | ICD-10-CM | POA: Diagnosis not present

## 2024-01-30 DIAGNOSIS — Z8673 Personal history of transient ischemic attack (TIA), and cerebral infarction without residual deficits: Secondary | ICD-10-CM | POA: Diagnosis not present

## 2024-01-30 DIAGNOSIS — D539 Nutritional anemia, unspecified: Secondary | ICD-10-CM | POA: Diagnosis not present

## 2024-01-30 DIAGNOSIS — I480 Paroxysmal atrial fibrillation: Secondary | ICD-10-CM | POA: Diagnosis not present

## 2024-01-30 DIAGNOSIS — Z7951 Long term (current) use of inhaled steroids: Secondary | ICD-10-CM | POA: Diagnosis not present

## 2024-01-30 DIAGNOSIS — Z96622 Presence of left artificial elbow joint: Secondary | ICD-10-CM | POA: Diagnosis not present

## 2024-01-30 DIAGNOSIS — E7800 Pure hypercholesterolemia, unspecified: Secondary | ICD-10-CM | POA: Diagnosis not present

## 2024-01-30 DIAGNOSIS — J441 Chronic obstructive pulmonary disease with (acute) exacerbation: Secondary | ICD-10-CM | POA: Diagnosis not present

## 2024-01-30 DIAGNOSIS — J9621 Acute and chronic respiratory failure with hypoxia: Secondary | ICD-10-CM | POA: Diagnosis not present

## 2024-01-30 DIAGNOSIS — Z87440 Personal history of urinary (tract) infections: Secondary | ICD-10-CM | POA: Diagnosis not present

## 2024-01-30 DIAGNOSIS — Z9842 Cataract extraction status, left eye: Secondary | ICD-10-CM | POA: Diagnosis not present

## 2024-01-30 DIAGNOSIS — G8929 Other chronic pain: Secondary | ICD-10-CM | POA: Diagnosis not present

## 2024-01-30 DIAGNOSIS — I110 Hypertensive heart disease with heart failure: Secondary | ICD-10-CM | POA: Diagnosis not present

## 2024-02-05 DIAGNOSIS — K219 Gastro-esophageal reflux disease without esophagitis: Secondary | ICD-10-CM | POA: Diagnosis not present

## 2024-02-05 DIAGNOSIS — E7800 Pure hypercholesterolemia, unspecified: Secondary | ICD-10-CM | POA: Diagnosis not present

## 2024-02-05 DIAGNOSIS — Z7951 Long term (current) use of inhaled steroids: Secondary | ICD-10-CM | POA: Diagnosis not present

## 2024-02-05 DIAGNOSIS — D539 Nutritional anemia, unspecified: Secondary | ICD-10-CM | POA: Diagnosis not present

## 2024-02-05 DIAGNOSIS — I480 Paroxysmal atrial fibrillation: Secondary | ICD-10-CM | POA: Diagnosis not present

## 2024-02-05 DIAGNOSIS — G9341 Metabolic encephalopathy: Secondary | ICD-10-CM | POA: Diagnosis not present

## 2024-02-05 DIAGNOSIS — I2510 Atherosclerotic heart disease of native coronary artery without angina pectoris: Secondary | ICD-10-CM | POA: Diagnosis not present

## 2024-02-05 DIAGNOSIS — I5032 Chronic diastolic (congestive) heart failure: Secondary | ICD-10-CM | POA: Diagnosis not present

## 2024-02-05 DIAGNOSIS — Z9981 Dependence on supplemental oxygen: Secondary | ICD-10-CM | POA: Diagnosis not present

## 2024-02-05 DIAGNOSIS — J441 Chronic obstructive pulmonary disease with (acute) exacerbation: Secondary | ICD-10-CM | POA: Diagnosis not present

## 2024-02-05 DIAGNOSIS — Z87440 Personal history of urinary (tract) infections: Secondary | ICD-10-CM | POA: Diagnosis not present

## 2024-02-05 DIAGNOSIS — G8929 Other chronic pain: Secondary | ICD-10-CM | POA: Diagnosis not present

## 2024-02-05 DIAGNOSIS — I110 Hypertensive heart disease with heart failure: Secondary | ICD-10-CM | POA: Diagnosis not present

## 2024-02-05 DIAGNOSIS — Z9842 Cataract extraction status, left eye: Secondary | ICD-10-CM | POA: Diagnosis not present

## 2024-02-05 DIAGNOSIS — Z96622 Presence of left artificial elbow joint: Secondary | ICD-10-CM | POA: Diagnosis not present

## 2024-02-05 DIAGNOSIS — J9621 Acute and chronic respiratory failure with hypoxia: Secondary | ICD-10-CM | POA: Diagnosis not present

## 2024-02-05 DIAGNOSIS — J9622 Acute and chronic respiratory failure with hypercapnia: Secondary | ICD-10-CM | POA: Diagnosis not present

## 2024-02-05 DIAGNOSIS — Z8673 Personal history of transient ischemic attack (TIA), and cerebral infarction without residual deficits: Secondary | ICD-10-CM | POA: Diagnosis not present

## 2024-02-07 DIAGNOSIS — I2510 Atherosclerotic heart disease of native coronary artery without angina pectoris: Secondary | ICD-10-CM | POA: Diagnosis not present

## 2024-02-07 DIAGNOSIS — D539 Nutritional anemia, unspecified: Secondary | ICD-10-CM | POA: Diagnosis not present

## 2024-02-07 DIAGNOSIS — E7800 Pure hypercholesterolemia, unspecified: Secondary | ICD-10-CM | POA: Diagnosis not present

## 2024-02-07 DIAGNOSIS — Z9842 Cataract extraction status, left eye: Secondary | ICD-10-CM | POA: Diagnosis not present

## 2024-02-07 DIAGNOSIS — I110 Hypertensive heart disease with heart failure: Secondary | ICD-10-CM | POA: Diagnosis not present

## 2024-02-07 DIAGNOSIS — I5032 Chronic diastolic (congestive) heart failure: Secondary | ICD-10-CM | POA: Diagnosis not present

## 2024-02-07 DIAGNOSIS — I480 Paroxysmal atrial fibrillation: Secondary | ICD-10-CM | POA: Diagnosis not present

## 2024-02-07 DIAGNOSIS — Z96622 Presence of left artificial elbow joint: Secondary | ICD-10-CM | POA: Diagnosis not present

## 2024-02-07 DIAGNOSIS — J9621 Acute and chronic respiratory failure with hypoxia: Secondary | ICD-10-CM | POA: Diagnosis not present

## 2024-02-07 DIAGNOSIS — K219 Gastro-esophageal reflux disease without esophagitis: Secondary | ICD-10-CM | POA: Diagnosis not present

## 2024-02-07 DIAGNOSIS — Z9981 Dependence on supplemental oxygen: Secondary | ICD-10-CM | POA: Diagnosis not present

## 2024-02-07 DIAGNOSIS — Z7951 Long term (current) use of inhaled steroids: Secondary | ICD-10-CM | POA: Diagnosis not present

## 2024-02-07 DIAGNOSIS — G9341 Metabolic encephalopathy: Secondary | ICD-10-CM | POA: Diagnosis not present

## 2024-02-07 DIAGNOSIS — J441 Chronic obstructive pulmonary disease with (acute) exacerbation: Secondary | ICD-10-CM | POA: Diagnosis not present

## 2024-02-07 DIAGNOSIS — J9622 Acute and chronic respiratory failure with hypercapnia: Secondary | ICD-10-CM | POA: Diagnosis not present

## 2024-02-07 DIAGNOSIS — G8929 Other chronic pain: Secondary | ICD-10-CM | POA: Diagnosis not present

## 2024-02-07 DIAGNOSIS — Z87440 Personal history of urinary (tract) infections: Secondary | ICD-10-CM | POA: Diagnosis not present

## 2024-02-07 DIAGNOSIS — Z8673 Personal history of transient ischemic attack (TIA), and cerebral infarction without residual deficits: Secondary | ICD-10-CM | POA: Diagnosis not present

## 2024-02-18 NOTE — Progress Notes (Deleted)
 [name], female    DOB: Oct 11, 1951    MRN: [id number]   Brief patient profile:  34 yowf  quit smoking 2014 and on 02 since 2009  referred to pulmonary clinic in [place]  07/26/2022 by Eboni for copd GOLD 4  / 02 dep  then admit:  Admit date: 06/29/2022 Discharge date: 07/07/2022 Admitted From: Home Disposition: SNF Recommendations for Outpatient Follow-up:  Please obtain BMP and CBC with differential in 1 week Outpatient follow-up with pulmonology about left lower lobe nodule Please follow up on the following pending results: None        Hospital course 73 year old F with PMH of COPD/chronic RF on 2 L, diastolic CHF, HTN, chronic back pain, urinary retention and anxiety presenting with progressive confusion and fever, and admitted for severe sepsis and acute metabolic encephalopathy due to acute pyelonephritis and E. coli bacteremia.  Patient was febrile to 101.7 with leukocytosis to 22 and hypotension to 90/41.  She was lethargic and had an AKI with creatinine of 1.3.  She also had elevated troponin to 902 that has trended up to 1114.  EKG without significant acute ischemic finding.  CT head negative.  CT chest abdomen and pelvis with contrast showed 7 mm LLL nodule, left pyelonephritis and minimal fullness of proximal ureter without obstruction.  Cultures obtained.  Foley catheter placed.  Patient was started on IV fluid and broad-spectrum antibiotics and admitted.  Cardiology consulted.    TTE with LVEF of 55 to 60%, G1 DD and moderate TVR.  Nuclear stress test with normal LV perfusion and no evidence of ischemia but poor quality due to patient's movement.    CTA chest PE protocol on 7/8 with increased clustered ill-defined GG nodularity in the superior segment of LLL and scattered tiny centrilobular nodules in both lungs consistent with infectious or inflammatory bronchiolitis and concern for aspiration pneumonia given the small amount of secretion in trachea.  MRI lumbar spine  without acute finding or osteomyelitis but multilevel spondylosis and moderate bilateral neuroforaminal stenosis at L5-S1.   Patient blood culture grew pansensitive E. coli.  Antibiotic de-escalated to IV Ancef.  She is discharged on p.o. cefadroxil for 6 more days to complete treatment course for E. coli bacteremia and pyelonephritis.    Therapy recommended SNF.   See individual problem list below for more.    Problems addressed during this hospitalization Principal Problem:   Severe sepsis (HCC) Active Problems:   Urinary retention   COPD exacerbation (HCC)   Anxiety   Acute on chronic diastolic CHF (congestive heart failure) (HCC)   Troponin level elevated   Acute pyelonephritis   E coli bacteremia   Acute metabolic encephalopathy   Acute on chronic respiratory failure with hypoxia and hypercapnia (HCC)   Urinary tract infection   Chronic lower back pain   Pulmonary nodule   Obesity (BMI 30-39.9)   Normocytic anemia   Severe sepsis due to acute left pyelonephritis and E. coli bacteremia in the setting of urinary retention, and possible aspiration pneumonia: POA.  Patient had fever, leukocytosis, hypotension, AKI and encephalopathy on presentation.  Blood culture with pansensitive E. coli.  Imaging concerning for left pyelonephritis and left ureteral fullness without obstructing stone.  Imaging also raises concern about left lung pneumonia and possible aspiration. -7/5 Vanco and cefepime>> 7/6 ceftriaxone> 7/10 Ancef> 7/13-7/19 cefadroxil -Doxycycline 7/11-7/15   Acute metabolic encephalopathy: Likely due to the above.  She is also at risk for polypharmacy due to pain medications other sedating medications.  Resolved. -Reorientation and delirium precautions -Minimize sedating medications   Possible aspiration pneumonia: CTA chest negative for PE but raises concern for LLL pneumonia with concern for aspiration pneumonia given fluid in trachea. -Low risk for aspiration per SLP.    Acute on chronic diastolic CHF: Evidence of fluid overload per exam by previous attending.  Likely in the setting of fluid resuscitation for sepsis.  TTE with normal LVEF, G1 DD and RVSP of 44.  BNP elevated but improved with diuretics.  Diuresed with IV Lasix.  Net -8.5 L.  Appears euvolemic on exam -P.o. Lasix 20 mg daily -Reassess BMP and fluid status at follow-up.   COPD exacerbation: Resolved. -Continue LABA/LAMA/ICS -As needed albuterol -Minimum oxygen to keep saturation above 90%   Acute on chronic hypoxic and hypercarbic respiratory failure/OSA not on CPAP: On 2 L at baseline.  -Minimum oxygen to keep saturation above 90%.  She is high risk for CO2 retention   Acute kidney injury: Resolved. Recent Labs (within last 365 days)             Recent Labs    06/29/22 2057 06/30/22 0451 07/01/22 0207 07/02/22 0122 07/03/22 0221 07/04/22 0049 07/05/22 0231 07/06/22 0301 07/07/22 0035  BUN 26* 25* 30* 23 20 21 19 15 12   CREATININE 1.29* 1.12* 0.94 0.83 0.67 0.91 0.86 0.79 0.64    -Recheck BMP in 1 week   Atypical chest pain/elevated troponin: Troponin elevated to 900 and peaked at 1114.  Demand ischemia?  TTE as above.  Nuclear stress test negative but poor quality.  Chest pain could be due to pneumonia as well.  Symptoms resolved.  Cardiology signed off.  At this point, we can say non-STEMI ruled out. -Continue aspirin and statin.   Essential hypertension: Normotensive off home Hyzaar. -Continue home Cardizem CD -P.o. Lasix 20 mg daily   Urinary retention: History of this.  She had Foley placed in ED. -Continue Flomax and bethanechol -Passed voiding trial.   Chronic low back pain-lumbar MRI without acute finding but spondylolysis with moderate bilateral neuroforaminal stenosis at L5-S1. -Continue therapy at SNF -Continue home Norco but not a great choice with her respiratory issue.   Normocytic anemia: Stable. -Recheck CBC at follow-up   8 mm ground-glass nodule in  the left lower lobe.  PCP to arrange outpatient pulmonary follow-up within 1 to 2 weeks of discharge for follow-up and monitoring.   Obesity Body mass index is 32.26 kg/m.     History of Present Illness  07/26/2022  Pulmonary/ 1st office eval/ Al Bracewell / [place] Office home from NH x one week p above admit  Chief Complaint  Patient presents with   Consult    Consult for O2 dropping normally uses 2LO2 cont.   Dyspnea:  indoor walking room to room  Cough: no am flares/ sporadic daytime min mucoid production Sleep: on side bed is flat 2 pillows  SABA use: hfa 3-4 x daily  02:  2lpm hs and does not titrate daytime Rec Continue trelegy one click each am  - take two good drags then rinse gargle  Only use your albuterol as a rescue medication Ok to try albuterol 15 min before an activity (on alternating days)  that you know would usually make you short of breath Make sure you check your oxygen saturation  AT  your highest level of activity (not after you stop)   to be sure it stays over 90%    10/18/2022  f/u ov/[place] office/Keslie Gritz re: GOLD 4 maint  on 02 24/7 and trelegy   Chief Complaint  Patient presents with   Follow-up    Doing well. Has concerns about a lung nodule mentioned from CT scan in July   Dyspnea:  walking more and planning walking outside, does not understand saba pre challenge or titration of 02 with exertion  Cough: much better  Sleeping: flat bed / 2 pillows  SABA use: hfa rarely / never neb  02: 2lpm hs up to 3lpm with sats 90 on 3lpm  Covid status: vax max  Rec Also  Ok to try albuterol 15 min before an activity (on alternating days)  that you know would usually make you short of breath  Make sure you check your oxygen saturation  AT  your highest level of activity (not after you stop)   to be sure it stays over 90%    06/30/2023  f/u ov/[place] office/Qais Jowers re: GOLD 4/ 02 dep copd  maint on trelegy  downhill x months / some better p abx from Dr Margo Aye for what  she says was a virus but no ongoing fever, chills but new R flank pain "x months" no pleuritic quality  Chief Complaint  Patient presents with   Follow-up  Dyspnea:  room to room at home 3lpm continuous Cough:  min rattle > clear mucus only  Sleeping: flat bed  2 pillows s resp cc  SABA use: 3 x daily only increases  after 02: 3lpm 24/7 Rec Make sure you check your oxygen saturation  AT  your highest level of activity (not after you stop)   to be sure it stays over 90%   My office will be contacting you by phone for referral to [employer] for best fit for ambulatory 0xygen  Please schedule a follow up office visit in 6 weeks, call sooner if needed with all medications /inhalers/ solutions in hand     Admit date: 07/19/2023 Discharge date: 07/24/2023   Discharge Diagnoses:  Principal Problem:   Acute respiratory failure with hypoxia and hypercapnia (HCC)   COPD with acute exacerbation (HCC)   Chronic diastolic CHF (congestive heart failure) (HCC)   Leukocytosis   Elevated troponin   Essential hypertension   History of UTI   Anxiety        History of present illness:  From admission h and p th medical history significant of hypertension, hyperlipidemia, unspecified CHF, COPD on home oxygen 3-4L, prior history of E. coli bacteremia, anxiety, urinary retention, and tobacco abuse who presented with acutely worsening shortness of breath and weakness.   Recently admitted in the hospital from 7/17-7/19 after having a fall at home with trauma to her head.  Noted to have acute metabolic encephalopathy which was thought secondary to fall with head trauma and had also been found to have concern for urinary tract infection growing E. coli for which patient received 2 days of Rocephin and was discharged home on nitrofurantoin.  She reported completing course of nitrofurantoin as prescribed.  Last night patient reported having progressively worsening shortness of breath yesterday evening.  Reported  having associated symptoms of subjective fever, wheezing, substernal chest pain, and weakness for which she was unable to get up to go to the bathroom.      On EMS  arrival patient noted to have O2 saturations of 67% on 3 L placed on a nonrebreather at 15 L with improvement in O2 saturations up to mid 90s.  Patient had received 2 DuoNeb breathing treatments and Solu-Medrol 125 mg  IV.      In the emergency department patient was noted to be afebrile, respirations 15-28, blood pressures elevated up to 179/93, and O2 saturations currently maintained on 3 L of nasal cannula oxygen.  Labs significant for WBC 13, hemoglobin 11, CO2 39, glucose 152, calcium 8.7, BNP 425.4, high-sensitivity troponin 36->32. Venous blood gas significant for pH 7.4, pCO2 83.8, and O2 95.  Chest x-ray noted similar appearance with increased interstitial prominence and diffuse peribronchial cuffing suggestive of background acute bronchitis.  Patient has been given breathing treatments, magnesium sulfate 2 g IV, Lasix 40 mg IV, and fentanyl 50 mcg IV.     Hospital Course:  Patient was treated with acute exacerbation of copd complicated by acute on chronic hypoxic hypercarbic respiratory failure. Treated with IV steroids and ceftriaxone/azithromycin. Symptomatically much improved. Weaned down to home 3 liters at rest. PT advised SNF but patient declines, will order home health PT/OT instead. Patient ambulated unassisted day of discharge and deems herself close to her baseline, requests discharge today.  Other chronic conditions stable. Will discharge home with oral steroid taper and close pcp/pulm f/u.       08/14/2023  f/u ov/[place] office/Wasif Simonich re: GOLD 4 COPD/02 dep  maint on trelegy 100 but  did not  bring meds  Chief Complaint  Patient presents with   COPD    Gold 4  Dyspnea:  best ever felt on prednisone x  5  days p discharge and slowly downhill since/ does not recall taking macrodantin Cough: none  Sleeping: flat bed  2 pillows no resp cc  SABA use: tid inhalation / neb  02: 3.5 conc at hs  and daily  on 4 lpm POC out  Rec Plan A = Automatic = Always=    Trelegy 100 one click each  tug hard to get it going and then full dep breath  Plan B = Backup (to supplement plan A, not to replace it) Only use your albuterol inhaler as a rescue medication Plan C = Crisis (instead of Plan B but only if Plan B stops working) - only use your albuterol nebulizer if you first try Plan B Also  Ok to try albuterol 15 min before an activity (on alternating days)  that you know would usually make you short of breath  Make sure you check your oxygen saturation  AT  your highest level of activity (not after you stop)   to be sure it stays over 90% and adjust  02 flow upward to maintain this level if needed but remember to turn it back to previous settings when you stop (to conserve your supply).  Cipro 500 mg twice daily x 10 day   Prednisone 10 mg take  4 each am x 2 days,   2 each am x 2 days,  1 each am x 2 days and stop   Please schedule a follow up office visit in 6 weeks, call sooner if needed with all medications /inhalers/ solutions in hand  Add:  My office will be contacting you by phone for referral to ambulatory 02 titration for BEST FIT for portable 02      Admit date: 11/12/2023 Discharge date: 11/17/2023 Home Health: None Equipment/Devices: BiPAP     Brief/Interim Summary: 73 year old female patient followed by Dr. Sherene Sires for chronic respiratory failure in the setting of Gold 4 COPD typically maintained on 2 L nasal cannula With baseline exertional dyspnea noted last seen as a hospital follow-up on August 14, 2023 following  hospitalization for sepsis from urinary tract infection, complicated further by exacerbation of her underlying lung disease, just discharged once again on 10/22 after being hospitalized for acute hypercarbic respiratory failure/pneumonia/heart failure.  11/12 patient reported worsening respiratory  symptoms in the office with notable hypoxia on baseline oxygen at 3.5 L.  Family called EMS on 11/17 patient was found to be poorly responsive and confused -admitted to ICU initially requiring intubation.   Patient admitted as above with acute respiratory failure requiring intubation due to COPD exacerbation.  Pneumonia has been ruled out at this time.  Patient improved drastically with supportive care, weaned off ventilator on the 19th and has been back to baseline oxygen needs for the past 24 hours.  She is otherwise stable and agreeable for discharge home, close follow-up with PCP and pulmonology as scheduled over the next 1 to 2 weeks.   Discharge Diagnoses:  Principal Problem:   Acute on chronic respiratory failure (HCC)   Acute on chronic hypoxic and hypercarbic respiratory failure secondary to acute exacerbation of chronic obstructive pulmonary disease Community-acquired pneumonia ruled out Successfully extubated 11/19 Currently on home oxygen at 3.5 L, ambulatory walk screen successful without any hypoxia Pulmonology team recommending BiPAP at night, TOC able to obtain device for discharge Steroid course completed Concurrent metabolic alkalosis/hypochloremia appear to be at baseline(consistent with labs over the past year)   Acute metabolic/hypercarbic encephalopathy  Resolved, patient is alert, awake and following commands   Chronic HFpEF without acute exacerbation HTN CAD Continue home medications including furosemide, aspirin, atorvastatin, and Lopressor   Recurrent UTIs Continue Urecholine and Flomax   Hypokalemia, resolved   Anxiety and depression Chronic pain issues Discontinue alprazolam at discharge given respiratory status -Would recommend weaning off narcotics as well Continue Lexapro   01/09/2024  Post hosp f/u  ov/[place] office/Codey Burling re: COPD gold 4  maint on no rx   Chief Complaint  Patient presents with   Hospitalization Follow-up    Sent home on NIV    Shortness of Breath  Dyspnea:  OFF prednisone no change room to room / only leaves home to go to doctor Cough: flutter valve white mucus thick  Sleeping: flat bed / 2 pillows and  NIV usually > 6 hours sleeping much better  SABA use: 3 x day hfa/ on duoneb once in am  02: 3.5   hs and  4lpm POC  Rec     02/20/2024  f/u ov/[place] office/Jane Broughton re: *** maint on ***  No chief complaint on file.   Dyspnea:  *** Cough: *** Sleeping: ***   resp cc  SABA use: *** 02: ***  Lung cancer screening: ***   No obvious day to day or daytime variability or assoc excess/ purulent sputum or mucus plugs or hemoptysis or cp or chest tightness, subjective wheeze or overt sinus or hb symptoms.    Also denies any obvious fluctuation of symptoms with weather or environmental changes or other aggravating or alleviating factors except as outlined above   No unusual exposure hx or h/o childhood pna/ asthma or knowledge of premature birth.  Current Allergies, Complete Past Medical History, Past Surgical History, Family History, and Social History were reviewed in [employer] record.  ROS  The following are not active complaints unless bolded Hoarseness, sore throat, dysphagia, dental problems, itching, sneezing,  nasal congestion or discharge of excess mucus or purulent secretions, ear ache,   fever, chills, sweats, unintended wt loss or wt gain, classically pleuritic or exertional cp,  orthopnea pnd or arm/hand swelling  or leg swelling, presyncope, palpitations, abdominal pain, anorexia, nausea, vomiting, diarrhea  or change in bowel habits or change in bladder habits, change in stools or change in urine, dysuria, hematuria,  rash, arthralgias, visual complaints, headache, numbness, weakness or ataxia or problems with walking or coordination,  change in mood or  memory.        No outpatient medications have been marked as taking for the 02/20/24 encounter (Appointment) with Nyoka Cowden, MD.           Past Medical History:  Diagnosis Date   Anxiety    Atrophic vaginitis 10/09/2015   Baker's cyst    left leg   Blood in urine 10/09/2015   CHF (congestive heart failure) (HCC)    COPD (chronic obstructive pulmonary disease) (HCC)    Heart murmur    Hematuria 10/09/2015   Hypertension    Nicotine addiction 04/07/2014   Pneumonia    Urinary urgency 01/25/2016   Vaginal bleeding 10/09/2015      Objective:    Wts  02/20/2024         ***  01/09/2024        172  08/14/2023        185  06/30/2023          180  10/18/22 175 lb 12.8 oz (79.7 kg)  08/08/22 170 lb (77.1 kg)  07/26/22 170 lb 3.2 oz (77.2 kg)   Vital signs reviewed  02/20/2024  - Note at rest 02 sats  ***% on ***   General appearance:    ***    Mod barr***                      Assessment

## 2024-02-20 ENCOUNTER — Ambulatory Visit: Payer: Medicare Other | Admitting: Internal Medicine

## 2024-02-27 ENCOUNTER — Telehealth: Payer: Self-pay | Admitting: Internal Medicine

## 2024-02-27 ENCOUNTER — Other Ambulatory Visit: Payer: Self-pay

## 2024-02-27 DIAGNOSIS — J449 Chronic obstructive pulmonary disease, unspecified: Secondary | ICD-10-CM

## 2024-02-27 DIAGNOSIS — J9611 Chronic respiratory failure with hypoxia: Secondary | ICD-10-CM

## 2024-02-27 MED ORDER — TRELEGY ELLIPTA 100-62.5-25 MCG/ACT IN AEPB: 1.0000 | INHALATION_SPRAY | Freq: Every day | RESPIRATORY_TRACT | Status: DC

## 2024-02-27 MED ORDER — TRELEGY ELLIPTA 100-62.5-25 MCG/ACT IN AEPB: 1.0000 | INHALATION_SPRAY | Freq: Every morning | RESPIRATORY_TRACT | 3 refills | Status: DC

## 2024-02-27 MED ORDER — TRELEGY ELLIPTA 100-62.5-25 MCG/ACT IN AEPB: 1.0000 | INHALATION_SPRAY | Freq: Every morning | RESPIRATORY_TRACT | 3 refills | Status: AC

## 2024-02-27 NOTE — Telephone Encounter (Signed)
 Patient states needs refill for Trelegy.Pharmacy is [facility] Also would like samples of Trelegy. Patient phone number is [phone].

## 2024-02-27 NOTE — Telephone Encounter (Signed)
 Please call Pharm to verify qty of Trelegy. Something seems wrong.

## 2024-02-27 NOTE — Telephone Encounter (Signed)
 I called and spoke with pt. Pt states she needs a refill and samples of Trelegy. I informed pt that I would leave her 2 samples.   I also called the pt's pharmacy and verified the Trelegy rx. The rx should be 1 puff into the lungs daily, we can do 60 each with 3 refills.   I spoke with Clerance Lav (pharmacist) and she stated she had it down as a quantity of 28 which does come this way but they usually order 60 each. Clerance Lav states it may be better insurance wise to have the rx sent is as 60 each. Sending order now. NFN

## 2024-02-27 NOTE — Telephone Encounter (Signed)
 Sent refill in to pharmacy for patient

## 2024-03-16 NOTE — Progress Notes (Deleted)
 [name], female    DOB: [birth year]    MRN: [id number]   Brief patient profile:  73 yowf  quit smoking 2014 and on 02 since 2009  referred to pulmonary clinic in [place]  07/26/2022 by [name] for copd GOLD 4  / 02 dep  then admit:  Admit date: 06/29/2022 Discharge date: 07/07/2022 Admitted From: Home Disposition: SNF Recommendations for Outpatient Follow-up:  Please obtain BMP and CBC with differential in 1 week Outpatient follow-up with pulmonology about left lower lobe nodule Please follow up on the following pending results: None        Hospital course 73 year old F with PMH of COPD/chronic RF on 2 L, diastolic CHF, HTN, chronic back pain, urinary retention and anxiety presenting with progressive confusion and fever, and admitted for severe sepsis and acute metabolic encephalopathy due to acute pyelonephritis and E. coli bacteremia.  Patient was febrile to 101.7 with leukocytosis to 22 and hypotension to 90/41.  She was lethargic and had an AKI with creatinine of 1.3.  She also had elevated troponin to 902 that has trended up to 1114.  EKG without significant acute ischemic finding.  CT head negative.  CT chest abdomen and pelvis with contrast showed 7 mm LLL nodule, left pyelonephritis and minimal fullness of proximal ureter without obstruction.  Cultures obtained.  Foley catheter placed.  Patient was started on IV fluid and broad-spectrum antibiotics and admitted.  Cardiology consulted.    TTE with LVEF of 55 to 60%, G1 DD and moderate TVR.  Nuclear stress test with normal LV perfusion and no evidence of ischemia but poor quality due to patient's movement.    CTA chest PE protocol on 7/8 with increased clustered ill-defined GG nodularity in the superior segment of LLL and scattered tiny centrilobular nodules in both lungs consistent with infectious or inflammatory bronchiolitis and concern for aspiration pneumonia given the small amount of secretion in trachea.  MRI lumbar spine  without acute finding or osteomyelitis but multilevel spondylosis and moderate bilateral neuroforaminal stenosis at L5-S1.   Patient blood culture grew pansensitive E. coli.  Antibiotic de-escalated to IV Ancef.  She is discharged on p.o. cefadroxil for 6 more days to complete treatment course for E. coli bacteremia and pyelonephritis.    Therapy recommended SNF.   See individual problem list below for more.    Problems addressed during this hospitalization Principal Problem:   Severe sepsis (HCC) Active Problems:   Urinary retention   COPD exacerbation (HCC)   Anxiety   Acute on chronic diastolic CHF (congestive heart failure) (HCC)   Troponin level elevated   Acute pyelonephritis   E coli bacteremia   Acute metabolic encephalopathy   Acute on chronic respiratory failure with hypoxia and hypercapnia (HCC)   Urinary tract infection   Chronic lower back pain   Pulmonary nodule   Obesity (BMI 30-39.9)   Normocytic anemia   Severe sepsis due to acute left pyelonephritis and E. coli bacteremia in the setting of urinary retention, and possible aspiration pneumonia: POA.  Patient had fever, leukocytosis, hypotension, AKI and encephalopathy on presentation.  Blood culture with pansensitive E. coli.  Imaging concerning for left pyelonephritis and left ureteral fullness without obstructing stone.  Imaging also raises concern about left lung pneumonia and possible aspiration. -7/5 Vanco and cefepime>> 7/6 ceftriaxone> 7/10 Ancef> 7/13-7/19 cefadroxil -Doxycycline 7/11-7/15   Acute metabolic encephalopathy: Likely due to the above.  She is also at risk for polypharmacy due to pain medications other sedating medications.  Resolved. -Reorientation and delirium precautions -Minimize sedating medications   Possible aspiration pneumonia: CTA chest negative for PE but raises concern for LLL pneumonia with concern for aspiration pneumonia given fluid in trachea. -Low risk for aspiration per SLP.    Acute on chronic diastolic CHF: Evidence of fluid overload per exam by previous attending.  Likely in the setting of fluid resuscitation for sepsis.  TTE with normal LVEF, G1 DD and RVSP of 44.  BNP elevated but improved with diuretics.  Diuresed with IV Lasix.  Net -8.5 L.  Appears euvolemic on exam -P.o. Lasix 20 mg daily -Reassess BMP and fluid status at follow-up.   COPD exacerbation: Resolved. -Continue LABA/LAMA/ICS -As needed albuterol -Minimum oxygen to keep saturation above 90%   Acute on chronic hypoxic and hypercarbic respiratory failure/OSA not on CPAP: On 2 L at baseline.  -Minimum oxygen to keep saturation above 90%.  She is high risk for CO2 retention   Acute kidney injury: Resolved. Recent Labs (within last 365 days)             Recent Labs    06/29/22 2057 06/30/22 0451 07/01/22 0207 07/02/22 0122 07/03/22 0221 07/04/22 0049 07/05/22 0231 07/06/22 0301 07/07/22 0035  BUN 26* 25* 30* 23 20 21 19 15 12   CREATININE 1.29* 1.12* 0.94 0.83 0.67 0.91 0.86 0.79 0.64    -Recheck BMP in 1 week   Atypical chest pain/elevated troponin: Troponin elevated to 900 and peaked at 1114.  Demand ischemia?  TTE as above.  Nuclear stress test negative but poor quality.  Chest pain could be due to pneumonia as well.  Symptoms resolved.  Cardiology signed off.  At this point, we can say non-STEMI ruled out. -Continue aspirin and statin.   Essential hypertension: Normotensive off home Hyzaar. -Continue home Cardizem CD -P.o. Lasix 20 mg daily   Urinary retention: History of this.  She had Foley placed in ED. -Continue Flomax and bethanechol -Passed voiding trial.   Chronic low back pain-lumbar MRI without acute finding but spondylolysis with moderate bilateral neuroforaminal stenosis at L5-S1. -Continue therapy at SNF -Continue home Norco but not a great choice with her respiratory issue.   Normocytic anemia: Stable. -Recheck CBC at follow-up   8 mm ground-glass nodule in  the left lower lobe.  PCP to arrange outpatient pulmonary follow-up within 1 to 2 weeks of discharge for follow-up and monitoring.   Obesity Body mass index is 32.26 kg/m.     History of Present Illness  07/26/2022  Pulmonary/ 1st Hanson eval/ [name] / [name] home from NH x one week p above admit  Chief Complaint  Patient presents with   Consult    Consult for O2 dropping normally uses 2LO2 cont.   Dyspnea:  indoor walking room to room  Cough: no am flares/ sporadic daytime min mucoid production Sleep: on side bed is flat 2 pillows  SABA use: hfa 3-4 x daily  02:  2lpm hs and does not titrate daytime Rec Continue trelegy one click each am  - take two good drags then rinse gargle  Only use your albuterol as a rescue medication Ok to try albuterol 15 min before an activity (on alternating days)  that you know would usually make you short of breath Make sure you check your oxygen saturation  AT  your highest level of activity (not after you stop)   to be sure it stays over 90%    10/18/2022  f/u ov/[name]/[name] re: GOLD 4 maint  on 02 24/7 and trelegy   Chief Complaint  Patient presents with   Follow-up    Doing well. Has concerns about a lung nodule mentioned from CT scan in July   Dyspnea:  walking more and planning walking outside, does not understand saba pre challenge or titration of 02 with exertion  Cough: much better  Sleeping: flat bed / 2 pillows  SABA use: hfa rarely / never neb  02: 2lpm hs up to 3lpm with sats 90 on 3lpm  Covid status: vax max  Rec Also  Ok to try albuterol 15 min before an activity (on alternating days)  that you know would usually make you short of breath  Make sure you check your oxygen saturation  AT  your highest level of activity (not after you stop)   to be sure it stays over 90%    06/30/2023  f/u ov/[place] Hanson/[name] re: GOLD 4/ 02 dep copd  maint on trelegy  downhill x months / some better p abx from Dr Margo Aye for what  she says was a virus but no ongoing fever, chills but new R flank pain "x months" no pleuritic quality  Chief Complaint  Patient presents with   Follow-up  Dyspnea:  room to room at home 3lpm continuous Cough:  min rattle > clear mucus only  Sleeping: flat bed  2 pillows s resp cc  SABA use: 3 x daily only increases  after 02: 3lpm 24/7 Rec Make sure you check your oxygen saturation  AT  your highest level of activity (not after you stop)   to be sure it stays over 90%   My Hanson will be contacting you by phone for referral to [facility] for best fit for ambulatory 0xygen  Please schedule a follow up Hanson visit in 6 weeks, call sooner if needed with all medications /inhalers/ solutions in hand     Admit date: 07/19/2023 Discharge date: 07/24/2023   Discharge Diagnoses:  Principal Problem:   Acute respiratory failure with hypoxia and hypercapnia (HCC)   COPD with acute exacerbation (HCC)   Chronic diastolic CHF (congestive heart failure) (HCC)   Leukocytosis   Elevated troponin   Essential hypertension   History of UTI   Anxiety        History of present illness:  From admission h and p th medical history significant of hypertension, hyperlipidemia, unspecified CHF, COPD on home oxygen 3-4L, prior history of E. coli bacteremia, anxiety, urinary retention, and tobacco abuse who presented with acutely worsening shortness of breath and weakness.   Recently admitted in the hospital from 7/17-7/19 after having a fall at home with trauma to her head.  Noted to have acute metabolic encephalopathy which was thought secondary to fall with head trauma and had also been found to have concern for urinary tract infection growing E. coli for which patient received 2 days of Rocephin and was discharged home on nitrofurantoin.  She reported completing course of nitrofurantoin as prescribed.  Last night patient reported having progressively worsening shortness of breath yesterday evening.  Reported  having associated symptoms of subjective fever, wheezing, substernal chest pain, and weakness for which she was unable to get up to go to the bathroom.      On EMS  arrival patient noted to have O2 saturations of 67% on 3 L placed on a nonrebreather at 15 L with improvement in O2 saturations up to mid 90s.  Patient had received 2 DuoNeb breathing treatments and Solu-Medrol 125 mg  IV.      In the emergency department patient was noted to be afebrile, respirations 15-28, blood pressures elevated up to 179/93, and O2 saturations currently maintained on 3 L of nasal cannula oxygen.  Labs significant for WBC 13, hemoglobin 11, CO2 39, glucose 152, calcium 8.7, BNP 425.4, high-sensitivity troponin 36->32. Venous blood gas significant for pH 7.4, pCO2 83.8, and O2 95.  Chest x-ray noted similar appearance with increased interstitial prominence and diffuse peribronchial cuffing suggestive of background acute bronchitis.  Patient has been given breathing treatments, magnesium sulfate 2 g IV, Lasix 40 mg IV, and fentanyl 50 mcg IV.     Hospital Course:  Patient was treated with acute exacerbation of copd complicated by acute on chronic hypoxic hypercarbic respiratory failure. Treated with IV steroids and ceftriaxone/azithromycin. Symptomatically much improved. Weaned down to home 3 liters at rest. PT advised SNF but patient declines, will order home health PT/OT instead. Patient ambulated unassisted day of discharge and deems herself close to her baseline, requests discharge today.  Other chronic conditions stable. Will discharge home with oral steroid taper and close pcp/pulm f/u.       08/14/2023  f/u ov/[place] Hanson/[name] re: GOLD 4 COPD/02 dep  maint on trelegy 100 but  did not  bring meds  Chief Complaint  Patient presents with   COPD    Gold 4  Dyspnea:  best ever felt on prednisone x  5  days p discharge and slowly downhill since/ does not recall taking macrodantin Cough: none  Sleeping: flat bed  2 pillows no resp cc  SABA use: tid inhalation / neb  02: 3.5 conc at hs  and daily  on 4 lpm POC out  Rec Plan A = Automatic = Always=    Trelegy 100 one click each  tug hard to get it going and then full dep breath  Plan B = Backup (to supplement plan A, not to replace it) Only use your albuterol inhaler as a rescue medication Plan C = Crisis (instead of Plan B but only if Plan B stops working) - only use your albuterol nebulizer if you first try Plan B Also  Ok to try albuterol 15 min before an activity (on alternating days)  that you know would usually make you short of breath  Make sure you check your oxygen saturation  AT  your highest level of activity (not after you stop)   to be sure it stays over 90% and adjust  02 flow upward to maintain this level if needed but remember to turn it back to previous settings when you stop (to conserve your supply).  Cipro 500 mg twice daily x 10 day   Prednisone 10 mg take  4 each am x 2 days,   2 each am x 2 days,  1 each am x 2 days and stop   Please schedule a follow up Hanson visit in 6 weeks, call sooner if needed with all medications /inhalers/ solutions in hand  Add:  My Hanson will be contacting you by phone for referral to ambulatory 02 titration for BEST FIT for portable 02      Admit date: 11/12/2023 Discharge date: 11/17/2023 Home Health: None Equipment/Devices: BiPAP     Brief/Interim Summary: 73 year old female patient followed by [name] for chronic respiratory failure in the setting of Gold 4 COPD typically maintained on 2 L nasal cannula With baseline exertional dyspnea noted last seen as a hospital follow-up on August 14, 2023 following  hospitalization for sepsis from urinary tract infection, complicated further by exacerbation of her underlying lung disease, just discharged once again on 10/22 after being hospitalized for acute hypercarbic respiratory failure/pneumonia/heart failure.  11/12 patient reported worsening respiratory  symptoms in the Hanson with notable hypoxia on baseline oxygen at 3.5 L.  Family called EMS on 11/17 patient was found to be poorly responsive and confused -admitted to ICU initially requiring intubation.   Patient admitted as above with acute respiratory failure requiring intubation due to COPD exacerbation.  Pneumonia has been ruled out at this time.  Patient improved drastically with supportive care, weaned off ventilator on the 19th and has been back to baseline oxygen needs for the past 24 hours.  She is otherwise stable and agreeable for discharge home, close follow-up with PCP and pulmonology as scheduled over the next 1 to 2 weeks.   Discharge Diagnoses:  Principal Problem:   Acute on chronic respiratory failure (HCC)   Acute on chronic hypoxic and hypercarbic respiratory failure secondary to acute exacerbation of chronic obstructive pulmonary disease Community-acquired pneumonia ruled out Successfully extubated 11/19 Currently on home oxygen at 3.5 L, ambulatory walk screen successful without any hypoxia Pulmonology team recommending BiPAP at night, TOC able to obtain device for discharge Steroid course completed Concurrent metabolic alkalosis/hypochloremia appear to be at baseline(consistent with labs over the past year)   Acute metabolic/hypercarbic encephalopathy  Resolved, patient is alert, awake and following commands   Chronic HFpEF without acute exacerbation HTN CAD Continue home medications including furosemide, aspirin, atorvastatin, and Lopressor   Recurrent UTIs Continue Urecholine and Flomax   Hypokalemia, resolved   Anxiety and depression Chronic pain issues Discontinue alprazolam at discharge given respiratory status -Would recommend weaning off narcotics as well Continue Lexapro   01/09/2024  Post hosp f/u  ov/[place] Hanson/[name] re: COPD gold 4  maint on no rx   Chief Complaint  Patient presents with   Hospitalization Follow-up    Sent home on NIV    Shortness of Breath  Dyspnea:  OFF prednisone no change room to room / only leaves home to go to doctor Cough: flutter valve white mucus thick  Sleeping: flat bed / 2 pillows and  NIV usually > 6 hours sleeping much better  SABA use: 3 x day hfa/ on duoneb once in am  02: 3.5   hs and  4lpm POC  Rec Restart your Trelegy 100  one click each am  I will see if we can get you approved for the ventilator and information from [employer] re settings  Please schedule a follow up Hanson visit in 6 weeks, call sooner if needed with your  oxygen meter and your inhalation medications/ solutions/ inhalers    03/18/2024  f/u ov/[place] Hanson/[name] re:  GOLD 4 copd /hypoxemic and hypercarbic RF  maint on ***  No chief complaint on file.   Dyspnea:  *** Cough: *** Sleeping: ***   resp cc  SABA use: *** 02: ***  Lung cancer screening: ***   No obvious day to day or daytime variability or assoc excess/ purulent sputum or mucus plugs or hemoptysis or cp or chest tightness, subjective wheeze or overt sinus or hb symptoms.    Also denies any obvious fluctuation of symptoms with weather or environmental changes or other aggravating or alleviating factors except as outlined above   No unusual exposure hx or h/o childhood pna/ asthma or knowledge of premature birth.  Current Allergies, Complete Past Medical History, Past Surgical History, Family  History, and Social History were reviewed in [employer] record.  ROS  The following are not active complaints unless bolded Hoarseness, sore throat, dysphagia, dental problems, itching, sneezing,  nasal congestion or discharge of excess mucus or purulent secretions, ear ache,   fever, chills, sweats, unintended wt loss or wt gain, classically pleuritic or exertional cp,  orthopnea pnd or arm/hand swelling  or leg swelling, presyncope, palpitations, abdominal pain, anorexia, nausea, vomiting, diarrhea  or change in bowel habits or change in  bladder habits, change in stools or change in urine, dysuria, hematuria,  rash, arthralgias, visual complaints, headache, numbness, weakness or ataxia or problems with walking or coordination,  change in mood or  memory.        No outpatient medications have been marked as taking for the 03/18/24 encounter (Appointment) with Nyoka Cowden, MD.           Past Medical History:  Diagnosis Date   Anxiety    Atrophic vaginitis 10/09/2015   Baker's cyst    left leg   Blood in urine 10/09/2015   CHF (congestive heart failure) (HCC)    COPD (chronic obstructive pulmonary disease) (HCC)    Heart murmur    Hematuria 10/09/2015   Hypertension    Nicotine addiction 04/07/2014   Pneumonia    Urinary urgency 01/25/2016   Vaginal bleeding 10/09/2015      Objective:    Wts  03/18/2024         ***  01/09/2024        172  08/14/2023        185  06/30/2023          180  10/18/22 175 lb 12.8 oz (79.7 kg)  08/08/22 170 lb (77.1 kg)  07/26/22 170 lb 3.2 oz (77.2 kg)   Vital signs reviewed  03/18/2024  - Note at rest 02 sats  ***% on ***   General appearance:    ***    Mod barr***                      Assessment

## 2024-03-18 ENCOUNTER — Telehealth: Payer: Self-pay | Admitting: Internal Medicine

## 2024-03-18 ENCOUNTER — Ambulatory Visit: Payer: Medicare Other | Admitting: Internal Medicine

## 2024-03-18 NOTE — Telephone Encounter (Signed)
 Patient called to reschedule her appointment for today  03/18/24 and wants to know if we still have the 2 samples of Trelegy behind the counter for her---she is out of medication  (they are not here at the front desk)  patient call back [phone]

## 2024-03-19 NOTE — Telephone Encounter (Signed)
YES [place]

## 2024-03-19 NOTE — Telephone Encounter (Signed)
 Called and spoke with pt informed patient sample are left up front for  her to pick up

## 2024-03-19 NOTE — Telephone Encounter (Signed)
 Is it okay for patient to have samples of this medication until her appt 04/04/24

## 2024-03-29 ENCOUNTER — Telehealth: Payer: Self-pay | Admitting: Urology

## 2024-03-29 ENCOUNTER — Other Ambulatory Visit: Payer: Self-pay

## 2024-03-29 DIAGNOSIS — R339 Retention of urine, unspecified: Secondary | ICD-10-CM

## 2024-03-29 MED ORDER — TAMSULOSIN HCL 0.4 MG PO CAPS: 0.4000 mg | ORAL_CAPSULE | Freq: Two times a day (BID) | ORAL | 3 refills | Status: DC

## 2024-03-29 NOTE — Telephone Encounter (Signed)
 Needs RX changed to twice a day. She is out and pharmacy will not fill because it was once a day. [facility]

## 2024-03-31 NOTE — Progress Notes (Deleted)
 [name], female    DOB: [birth year]    MRN: [id number]   Brief patient profile:  73 yowf  quit smoking 2014 and on 02 since 2009  referred to pulmonary clinic in [place]  07/26/2022 by E[place]i for copd GOLD 4  / 02 dep  then admit:  Admit date: 06/29/2022 Discharge date: 07/07/2022 Admitted From: Home Disposition: SNF Recommendations for Outpatient Follow-up:  Please obtain BMP and CBC with differential in 1 week Outpatient follow-up with pulmonology about left lower lobe nodule Please follow up on the following pending results: None        Hospital course 73 year old F with PMH of COPD/chronic RF on 2 L, diastolic CHF, HTN, chronic back pain, urinary retention and anxiety presenting with progressive confusion and fever, and admitted for severe sepsis and acute metabolic encephalopathy due to acute pyelonephritis and E. coli bacteremia.  Patient was febrile to 101.7 with leukocytosis to 22 and hypotension to 90/41.  She was lethargic and had an AKI with creatinine of 1.3.  She also had elevated troponin to 902 that has trended up to 1114.  EKG without significant acute ischemic finding.  CT head negative.  CT chest abdomen and pelvis with contrast showed 7 mm LLL nodule, left pyelonephritis and minimal fullness of proximal ureter without obstruction.  Cultures obtained.  Foley catheter placed.  Patient was started on IV fluid and broad-spectrum antibiotics and admitted.  Cardiology consulted.    TTE with LVEF of 55 to 60%, G1 DD and moderate TVR.  Nuclear stress test with normal LV perfusion and no evidence of ischemia but poor quality due to patient's movement.    CTA chest PE protocol on 7/8 with increased clustered ill-defined GG nodularity in the superior segment of LLL and scattered tiny centrilobular nodules in both lungs consistent with infectious or inflammatory bronchiolitis and concern for aspiration pneumonia given the small amount of secretion in trachea.  MRI lumbar spine  without acute finding or osteomyelitis but multilevel spondylosis and moderate bilateral neuroforaminal stenosis at L5-S1.   Patient blood culture grew pansensitive E. coli.  Antibiotic de-escalated to IV Ancef.  She is discharged on p.o. cefadroxil for 6 more days to complete treatment course for E. coli bacteremia and pyelonephritis.    Therapy recommended SNF.   See individual problem list below for more.    Problems addressed during this hospitalization Principal Problem:   Severe sepsis (HCC) Active Problems:   Urinary retention   COPD exacerbation (HCC)   Anxiety   Acute on chronic diastolic CHF (congestive heart failure) (HCC)   Troponin level elevated   Acute pyelonephritis   E coli bacteremia   Acute metabolic encephalopathy   Acute on chronic respiratory failure with hypoxia and hypercapnia (HCC)   Urinary tract infection   Chronic lower back pain   Pulmonary nodule   Obesity (BMI 30-39.9)   Normocytic anemia   Severe sepsis due to acute left pyelonephritis and E. coli bacteremia in the setting of urinary retention, and possible aspiration pneumonia: POA.  Patient had fever, leukocytosis, hypotension, AKI and encephalopathy on presentation.  Blood culture with pansensitive E. coli.  Imaging concerning for left pyelonephritis and left ureteral fullness without obstructing stone.  Imaging also raises concern about left lung pneumonia and possible aspiration. -7/5 Vanco and cefepime>> 7/6 ceftriaxone> 7/10 Ancef> 7/13-7/19 cefadroxil -Doxycycline 7/11-7/15   Acute metabolic encephalopathy: Likely due to the above.  She is also at risk for polypharmacy due to pain medications other sedating medications.  Resolved. -Reorientation and delirium precautions -Minimize sedating medications   Possible aspiration pneumonia: CTA chest negative for PE but raises concern for LLL pneumonia with concern for aspiration pneumonia given fluid in trachea. -Low risk for aspiration per SLP.    Acute on chronic diastolic CHF: Evidence of fluid overload per exam by previous attending.  Likely in the setting of fluid resuscitation for sepsis.  TTE with normal LVEF, G1 DD and RVSP of 44.  BNP elevated but improved with diuretics.  Diuresed with IV Lasix.  Net -8.5 L.  Appears euvolemic on exam -P.o. Lasix 20 mg daily -Reassess BMP and fluid status at follow-up.   COPD exacerbation: Resolved. -Continue LABA/LAMA/ICS -As needed albuterol -Minimum oxygen to keep saturation above 90%   Acute on chronic hypoxic and hypercarbic respiratory failure/OSA not on CPAP: On 2 L at baseline.  -Minimum oxygen to keep saturation above 90%.  She is high risk for CO2 retention   Acute kidney injury: Resolved. Recent Labs (within last 365 days)             Recent Labs    06/29/22 2057 06/30/22 0451 07/01/22 0207 07/02/22 0122 07/03/22 0221 07/04/22 0049 07/05/22 0231 07/06/22 0301 07/07/22 0035  BUN 26* 25* 30* 23 20 21 19 15 12   CREATININE 1.29* 1.12* 0.94 0.83 0.67 0.91 0.86 0.79 0.64    -Recheck BMP in 1 week   Atypical chest pain/elevated troponin: Troponin elevated to 900 and peaked at 1114.  Demand ischemia?  TTE as above.  Nuclear stress test negative but poor quality.  Chest pain could be due to pneumonia as well.  Symptoms resolved.  Cardiology signed off.  At this point, we can say non-STEMI ruled out. -Continue aspirin and statin.   Essential hypertension: Normotensive off home Hyzaar. -Continue home Cardizem CD -P.o. Lasix 20 mg daily   Urinary retention: History of this.  She had Foley placed in ED. -Continue Flomax and bethanechol -Passed voiding trial.   Chronic low back pain-lumbar MRI without acute finding but spondylolysis with moderate bilateral neuroforaminal stenosis at L5-S1. -Continue therapy at SNF -Continue home Norco but not a great choice with her respiratory issue.   Normocytic anemia: Stable. -Recheck CBC at follow-up   8 mm ground-glass nodule in  the left lower lobe.  PCP to arrange outpatient pulmonary follow-up within 1 to 2 weeks of discharge for follow-up and monitoring.   Obesity Body mass index is 32.26 kg/m.     History of Present Illness  07/26/2022  Pulmonary/ 1st Hanson eval/ [name] / [name] home from NH x one week p above admit  Chief Complaint  Patient presents with   Consult    Consult for O2 dropping normally uses 2LO2 cont.   Dyspnea:  indoor walking room to room  Cough: no am flares/ sporadic daytime min mucoid production Sleep: on side bed is flat 2 pillows  SABA use: hfa 3-4 x daily  02:  2lpm hs and does not titrate daytime Rec Continue trelegy one click each am  - take two good drags then rinse gargle  Only use your albuterol as a rescue medication Ok to try albuterol 15 min before an activity (on alternating days)  that you know would usually make you short of breath Make sure you check your oxygen saturation  AT  your highest level of activity (not after you stop)   to be sure it stays over 90%    10/18/2022  f/u ov/[place] Hanson/[name] re: GOLD 4 maint  on 02 24/7 and trelegy   Chief Complaint  Patient presents with   Follow-up    Doing well. Has concerns about a lung nodule mentioned from CT scan in July   Dyspnea:  walking more and planning walking outside, does not understand saba pre challenge or titration of 02 with exertion  Cough: much better  Sleeping: flat bed / 2 pillows  SABA use: hfa rarely / never neb  02: 2lpm hs up to 3lpm with sats 90 on 3lpm  Covid status: vax max  Rec Also  Ok to try albuterol 15 min before an activity (on alternating days)  that you know would usually make you short of breath  Make sure you check your oxygen saturation  AT  your highest level of activity (not after you stop)   to be sure it stays over 90%    06/30/2023  f/u ov/[place] Hanson/[name] re: GOLD 4/ 02 dep copd  maint on trelegy  downhill x months / some better p abx from Dr Margo Aye for what  she says was a virus but no ongoing fever, chills but new R flank pain "x months" no pleuritic quality  Chief Complaint  Patient presents with   Follow-up  Dyspnea:  room to room at home 3lpm continuous Cough:  min rattle > clear mucus only  Sleeping: flat bed  2 pillows s resp cc  SABA use: 3 x daily only increases  after 02: 3lpm 24/7 Rec Make sure you check your oxygen saturation  AT  your highest level of activity (not after you stop)   to be sure it stays over 90%   My Hanson will be contacting you by phone for referral to [facility] for best fit for ambulatory 0xygen  Please schedule a follow up Hanson visit in 6 weeks, call sooner if needed with all medications /inhalers/ solutions in hand     Admit date: 07/19/2023 Discharge date: 07/24/2023   Discharge Diagnoses:  Principal Problem:   Acute respiratory failure with hypoxia and hypercapnia (HCC)   COPD with acute exacerbation (HCC)   Chronic diastolic CHF (congestive heart failure) (HCC)   Leukocytosis   Elevated troponin   Essential hypertension   History of UTI   Anxiety        History of present illness:  From admission h and p th medical history significant of hypertension, hyperlipidemia, unspecified CHF, COPD on home oxygen 3-4L, prior history of E. coli bacteremia, anxiety, urinary retention, and tobacco abuse who presented with acutely worsening shortness of breath and weakness.   Recently admitted in the hospital from 7/17-7/19 after having a fall at home with trauma to her head.  Noted to have acute metabolic encephalopathy which was thought secondary to fall with head trauma and had also been found to have concern for urinary tract infection growing E. coli for which patient received 2 days of Rocephin and was discharged home on nitrofurantoin.  She reported completing course of nitrofurantoin as prescribed.  Last night patient reported having progressively worsening shortness of breath yesterday evening.  Reported  having associated symptoms of subjective fever, wheezing, substernal chest pain, and weakness for which she was unable to get up to go to the bathroom.      On EMS  arrival patient noted to have O2 saturations of 67% on 3 L placed on a nonrebreather at 15 L with improvement in O2 saturations up to mid 90s.  Patient had received 2 DuoNeb breathing treatments and Solu-Medrol 125 mg  IV.      In the emergency department patient was noted to be afebrile, respirations 15-28, blood pressures elevated up to 179/93, and O2 saturations currently maintained on 3 L of nasal cannula oxygen.  Labs significant for WBC 13, hemoglobin 11, CO2 39, glucose 152, calcium 8.7, BNP 425.4, high-sensitivity troponin 36->32. Venous blood gas significant for pH 7.4, pCO2 83.8, and O2 95.  Chest x-ray noted similar appearance with increased interstitial prominence and diffuse peribronchial cuffing suggestive of background acute bronchitis.  Patient has been given breathing treatments, magnesium sulfate 2 g IV, Lasix 40 mg IV, and fentanyl 50 mcg IV.     Hospital Course:  Patient was treated with acute exacerbation of copd complicated by acute on chronic hypoxic hypercarbic respiratory failure. Treated with IV steroids and ceftriaxone/azithromycin. Symptomatically much improved. Weaned down to home 3 liters at rest. PT advised SNF but patient declines, will order home health PT/OT instead. Patient ambulated unassisted day of discharge and deems herself close to her baseline, requests discharge today.  Other chronic conditions stable. Will discharge home with oral steroid taper and close pcp/pulm f/u.       08/14/2023  f/u ov/[place] Hanson/[name] re: GOLD 4 COPD/02 dep  maint on trelegy 100 but  did not  bring meds  Chief Complaint  Patient presents with   COPD    Gold 4  Dyspnea:  best ever felt on prednisone x  5  days p discharge and slowly downhill since/ does not recall taking macrodantin Cough: none  Sleeping: flat bed  2 pillows no resp cc  SABA use: tid inhalation / neb  02: 3.5 conc at hs  and daily  on 4 lpm POC out  Rec Plan A = Automatic = Always=    Trelegy 100 one click each  tug hard to get it going and then full dep breath  Plan B = Backup (to supplement plan A, not to replace it) Only use your albuterol inhaler as a rescue medication Plan C = Crisis (instead of Plan B but only if Plan B stops working) - only use your albuterol nebulizer if you first try Plan B Also  Ok to try albuterol 15 min before an activity (on alternating days)  that you know would usually make you short of breath  Make sure you check your oxygen saturation  AT  your highest level of activity (not after you stop)   to be sure it stays over 90% and adjust  02 flow upward to maintain this level if needed but remember to turn it back to previous settings when you stop (to conserve your supply).  Cipro 500 mg twice daily x 10 day   Prednisone 10 mg take  4 each am x 2 days,   2 each am x 2 days,  1 each am x 2 days and stop   Please schedule a follow up Hanson visit in 6 weeks, call sooner if needed with all medications /inhalers/ solutions in hand  Add:  My Hanson will be contacting you by phone for referral to ambulatory 02 titration for BEST FIT for portable 02      Admit date: 11/12/2023 Discharge date: 11/17/2023 Home Health: None Equipment/Devices: BiPAP     Brief/Interim Summary: 73 year old female patient followed by [name] for chronic respiratory failure in the setting of Gold 4 COPD typically maintained on 2 L nasal cannula With baseline exertional dyspnea noted last seen as a hospital follow-up on August 14, 2023 following  hospitalization for sepsis from urinary tract infection, complicated further by exacerbation of her underlying lung disease, just discharged once again on 10/22 after being hospitalized for acute hypercarbic respiratory failure/pneumonia/heart failure.  11/12 patient reported worsening respiratory  symptoms in the Hanson with notable hypoxia on baseline oxygen at 3.5 L.  Family called EMS on 11/17 patient was found to be poorly responsive and confused -admitted to ICU initially requiring intubation.   Patient admitted as above with acute respiratory failure requiring intubation due to COPD exacerbation.  Pneumonia has been ruled out at this time.  Patient improved drastically with supportive care, weaned off ventilator on the 19th and has been back to baseline oxygen needs for the past 24 hours.  She is otherwise stable and agreeable for discharge home, close follow-up with PCP and pulmonology as scheduled over the next 1 to 2 weeks.   Discharge Diagnoses:  Principal Problem:   Acute on chronic respiratory failure (HCC)   Acute on chronic hypoxic and hypercarbic respiratory failure secondary to acute exacerbation of chronic obstructive pulmonary disease Community-acquired pneumonia ruled out Successfully extubated 11/19 Currently on home oxygen at 3.5 L, ambulatory walk screen successful without any hypoxia Pulmonology team recommending BiPAP at night, TOC able to obtain device for discharge Steroid course completed Concurrent metabolic alkalosis/hypochloremia appear to be at baseline(consistent with labs over the past year)   Acute metabolic/hypercarbic encephalopathy  Resolved, patient is alert, awake and following commands   Chronic HFpEF without acute exacerbation HTN CAD Continue home medications including furosemide, aspirin, atorvastatin, and Lopressor   Recurrent UTIs Continue Urecholine and Flomax   Hypokalemia, resolved   Anxiety and depression Chronic pain issues Discontinue alprazolam at discharge given respiratory status -Would recommend weaning off narcotics as well Continue Lexapro   01/09/2024  Post hosp f/u  ov/[place] Hanson/[name] re: COPD gold 4  maint on no rx   Chief Complaint  Patient presents with   Hospitalization Follow-up    Sent home on NIV    Shortness of Breath  Dyspnea:  OFF prednisone no change room to room / only leaves home to go to doctor Cough: flutter valve white mucus thick  Sleeping: flat bed / 2 pillows and  NIV usually > 6 hours sleeping much better  SABA use: 3 x day hfa/ on duoneb once in am  02: 3.5   hs and  4lpm POC  Rec Restart your Trelegy 100  one click each am  I will see if we can get you approved for the ventilator and information from [employer] re settings  Please schedule a follow up Hanson visit in 6 weeks, call sooner if needed with your  oxygen meter and your inhalation medications/ solutions/ inhalers    03/18/2024  f/u ov/[place] Hanson/[name] re:  GOLD 4 copd /hypoxemic and hypercarbic RF  maint on ***  No chief complaint on file.   Dyspnea:  *** Cough: *** Sleeping: ***   resp cc  SABA use: *** 02: ***  Lung cancer screening: ***   No obvious day to day or daytime variability or assoc excess/ purulent sputum or mucus plugs or hemoptysis or cp or chest tightness, subjective wheeze or overt sinus or hb symptoms.    Also denies any obvious fluctuation of symptoms with weather or environmental changes or other aggravating or alleviating factors except as outlined above   No unusual exposure hx or h/o childhood pna/ asthma or knowledge of premature birth.  Current Allergies, Complete Past Medical History, Past Surgical History, Family  History, and Social History were reviewed in [employer] record.  ROS  The following are not active complaints unless bolded Hoarseness, sore throat, dysphagia, dental problems, itching, sneezing,  nasal congestion or discharge of excess mucus or purulent secretions, ear ache,   fever, chills, sweats, unintended wt loss or wt gain, classically pleuritic or exertional cp,  orthopnea pnd or arm/hand swelling  or leg swelling, presyncope, palpitations, abdominal pain, anorexia, nausea, vomiting, diarrhea  or change in bowel habits or change in  bladder habits, change in stools or change in urine, dysuria, hematuria,  rash, arthralgias, visual complaints, headache, numbness, weakness or ataxia or problems with walking or coordination,  change in mood or  memory.        No outpatient medications have been marked as taking for the 03/18/24 encounter (Appointment) with Nyoka Cowden, MD.           Past Medical History:  Diagnosis Date   Anxiety    Atrophic vaginitis 10/09/2015   Baker's cyst    left leg   Blood in urine 10/09/2015   CHF (congestive heart failure) (HCC)    COPD (chronic obstructive pulmonary disease) (HCC)    Heart murmur    Hematuria 10/09/2015   Hypertension    Nicotine addiction 04/07/2014   Pneumonia    Urinary urgency 01/25/2016   Vaginal bleeding 10/09/2015      Objective:    Wts  03/18/2024         ***  01/09/2024        172  08/14/2023        185  06/30/2023          180  10/18/22 175 lb 12.8 oz (79.7 kg)  08/08/22 170 lb (77.1 kg)  07/26/22 170 lb 3.2 oz (77.2 kg)   Vital signs reviewed  03/18/2024  - Note at rest 02 sats  ***% on ***   General appearance:    ***    Mod barr***                      Assessment

## 2024-04-04 ENCOUNTER — Ambulatory Visit: Admitting: Internal Medicine

## 2024-04-15 DIAGNOSIS — E782 Mixed hyperlipidemia: Secondary | ICD-10-CM | POA: Diagnosis not present

## 2024-04-15 DIAGNOSIS — I5032 Chronic diastolic (congestive) heart failure: Secondary | ICD-10-CM | POA: Diagnosis not present

## 2024-04-15 DIAGNOSIS — I10 Essential (primary) hypertension: Secondary | ICD-10-CM | POA: Diagnosis not present

## 2024-04-15 DIAGNOSIS — J449 Chronic obstructive pulmonary disease, unspecified: Secondary | ICD-10-CM | POA: Diagnosis not present

## 2024-05-02 DIAGNOSIS — M24849 Other specific joint derangements of unspecified hand, not elsewhere classified: Secondary | ICD-10-CM | POA: Diagnosis not present

## 2024-05-02 DIAGNOSIS — K5909 Other constipation: Secondary | ICD-10-CM | POA: Diagnosis not present

## 2024-05-02 DIAGNOSIS — Z860100 Personal history of colon polyps, unspecified: Secondary | ICD-10-CM | POA: Diagnosis not present

## 2024-05-02 DIAGNOSIS — I700 Atherosclerosis of aorta: Secondary | ICD-10-CM | POA: Diagnosis not present

## 2024-05-02 DIAGNOSIS — E782 Mixed hyperlipidemia: Secondary | ICD-10-CM | POA: Diagnosis not present

## 2024-05-02 DIAGNOSIS — J449 Chronic obstructive pulmonary disease, unspecified: Secondary | ICD-10-CM | POA: Diagnosis not present

## 2024-05-02 DIAGNOSIS — D649 Anemia, unspecified: Secondary | ICD-10-CM | POA: Diagnosis not present

## 2024-05-02 DIAGNOSIS — R339 Retention of urine, unspecified: Secondary | ICD-10-CM | POA: Diagnosis not present

## 2024-05-02 DIAGNOSIS — I5032 Chronic diastolic (congestive) heart failure: Secondary | ICD-10-CM | POA: Diagnosis not present

## 2024-05-02 DIAGNOSIS — N390 Urinary tract infection, site not specified: Secondary | ICD-10-CM | POA: Diagnosis not present

## 2024-05-02 DIAGNOSIS — I10 Essential (primary) hypertension: Secondary | ICD-10-CM | POA: Diagnosis not present

## 2024-05-05 ENCOUNTER — Inpatient Hospital Stay (HOSPITAL_COMMUNITY)
Admission: EM | Admit: 2024-05-05 | Discharge: 2024-05-26 | DRG: 208 | Disposition: E | Attending: Pulmonary Disease | Admitting: Pulmonary Disease

## 2024-05-05 DIAGNOSIS — Z800 Family history of malignant neoplasm of digestive organs: Secondary | ICD-10-CM

## 2024-05-05 DIAGNOSIS — G931 Anoxic brain damage, not elsewhere classified: Secondary | ICD-10-CM | POA: Diagnosis not present

## 2024-05-05 DIAGNOSIS — E8729 Other acidosis: Secondary | ICD-10-CM | POA: Diagnosis not present

## 2024-05-05 DIAGNOSIS — J449 Chronic obstructive pulmonary disease, unspecified: Secondary | ICD-10-CM | POA: Diagnosis not present

## 2024-05-05 DIAGNOSIS — E7800 Pure hypercholesterolemia, unspecified: Secondary | ICD-10-CM | POA: Diagnosis present

## 2024-05-05 DIAGNOSIS — J984 Other disorders of lung: Secondary | ICD-10-CM | POA: Diagnosis not present

## 2024-05-05 DIAGNOSIS — Z9981 Dependence on supplemental oxygen: Secondary | ICD-10-CM

## 2024-05-05 DIAGNOSIS — G935 Compression of brain: Secondary | ICD-10-CM | POA: Diagnosis present

## 2024-05-05 DIAGNOSIS — Z803 Family history of malignant neoplasm of breast: Secondary | ICD-10-CM

## 2024-05-05 DIAGNOSIS — I4430 Unspecified atrioventricular block: Secondary | ICD-10-CM | POA: Diagnosis not present

## 2024-05-05 DIAGNOSIS — Z4682 Encounter for fitting and adjustment of non-vascular catheter: Secondary | ICD-10-CM | POA: Diagnosis not present

## 2024-05-05 DIAGNOSIS — G253 Myoclonus: Secondary | ICD-10-CM | POA: Diagnosis not present

## 2024-05-05 DIAGNOSIS — Z8249 Family history of ischemic heart disease and other diseases of the circulatory system: Secondary | ICD-10-CM

## 2024-05-05 DIAGNOSIS — Z888 Allergy status to other drugs, medicaments and biological substances status: Secondary | ICD-10-CM

## 2024-05-05 DIAGNOSIS — R579 Shock, unspecified: Secondary | ICD-10-CM | POA: Diagnosis not present

## 2024-05-05 DIAGNOSIS — Z515 Encounter for palliative care: Secondary | ICD-10-CM | POA: Diagnosis not present

## 2024-05-05 DIAGNOSIS — R404 Transient alteration of awareness: Secondary | ICD-10-CM | POA: Diagnosis not present

## 2024-05-05 DIAGNOSIS — I480 Paroxysmal atrial fibrillation: Secondary | ICD-10-CM | POA: Diagnosis present

## 2024-05-05 DIAGNOSIS — I6782 Cerebral ischemia: Secondary | ICD-10-CM | POA: Diagnosis not present

## 2024-05-05 DIAGNOSIS — J9621 Acute and chronic respiratory failure with hypoxia: Principal | ICD-10-CM | POA: Diagnosis present

## 2024-05-05 DIAGNOSIS — I469 Cardiac arrest, cause unspecified: Secondary | ICD-10-CM | POA: Diagnosis not present

## 2024-05-05 DIAGNOSIS — G919 Hydrocephalus, unspecified: Secondary | ICD-10-CM | POA: Diagnosis present

## 2024-05-05 DIAGNOSIS — R569 Unspecified convulsions: Secondary | ICD-10-CM | POA: Diagnosis not present

## 2024-05-05 DIAGNOSIS — J90 Pleural effusion, not elsewhere classified: Secondary | ICD-10-CM | POA: Diagnosis not present

## 2024-05-05 DIAGNOSIS — Z8049 Family history of malignant neoplasm of other genital organs: Secondary | ICD-10-CM

## 2024-05-05 DIAGNOSIS — R570 Cardiogenic shock: Secondary | ICD-10-CM | POA: Diagnosis not present

## 2024-05-05 DIAGNOSIS — Z7982 Long term (current) use of aspirin: Secondary | ICD-10-CM

## 2024-05-05 DIAGNOSIS — I499 Cardiac arrhythmia, unspecified: Secondary | ICD-10-CM | POA: Diagnosis not present

## 2024-05-05 DIAGNOSIS — N179 Acute kidney failure, unspecified: Secondary | ICD-10-CM | POA: Diagnosis not present

## 2024-05-05 DIAGNOSIS — J9622 Acute and chronic respiratory failure with hypercapnia: Secondary | ICD-10-CM | POA: Diagnosis present

## 2024-05-05 DIAGNOSIS — Z79899 Other long term (current) drug therapy: Secondary | ICD-10-CM

## 2024-05-05 DIAGNOSIS — Z7951 Long term (current) use of inhaled steroids: Secondary | ICD-10-CM

## 2024-05-05 DIAGNOSIS — Z87891 Personal history of nicotine dependence: Secondary | ICD-10-CM

## 2024-05-05 DIAGNOSIS — I110 Hypertensive heart disease with heart failure: Secondary | ICD-10-CM | POA: Diagnosis not present

## 2024-05-05 DIAGNOSIS — R739 Hyperglycemia, unspecified: Secondary | ICD-10-CM | POA: Diagnosis not present

## 2024-05-05 DIAGNOSIS — R0602 Shortness of breath: Secondary | ICD-10-CM | POA: Diagnosis not present

## 2024-05-05 DIAGNOSIS — I5032 Chronic diastolic (congestive) heart failure: Secondary | ICD-10-CM | POA: Diagnosis present

## 2024-05-05 DIAGNOSIS — G936 Cerebral edema: Secondary | ICD-10-CM | POA: Diagnosis not present

## 2024-05-05 MED ORDER — FENTANYL CITRATE PF 50 MCG/ML IJ SOSY
25.0000 ug | PREFILLED_SYRINGE | Freq: Once | INTRAMUSCULAR | Status: AC
  Administered 2024-05-06: 25 ug via INTRAVENOUS

## 2024-05-05 MED ORDER — DOCUSATE SODIUM 50 MG/5ML PO LIQD
100.0000 mg | Freq: Two times a day (BID) | ORAL | Status: DC
  Administered 2024-05-06 (×2): 100 mg
  Filled 2024-05-05 (×4): qty 10

## 2024-05-05 MED ORDER — MIDAZOLAM HCL 2 MG/2ML IJ SOLN
1.0000 mg | INTRAMUSCULAR | Status: DC | PRN
  Administered 2024-05-06 (×2): 2 mg via INTRAVENOUS
  Filled 2024-05-05 (×3): qty 2

## 2024-05-05 MED ORDER — FENTANYL 2500MCG IN NS 250ML (10MCG/ML) PREMIX INFUSION
25.0000 ug/h | INTRAVENOUS | Status: DC
  Administered 2024-05-05: 50 ug/h via INTRAVENOUS
  Administered 2024-05-06: 100 ug/h via INTRAVENOUS
  Administered 2024-05-08: 50 ug/h via INTRAVENOUS
  Filled 2024-05-05 (×3): qty 250

## 2024-05-05 MED ORDER — FENTANYL BOLUS VIA INFUSION
25.0000 ug | INTRAVENOUS | Status: DC | PRN
  Administered 2024-05-07: 50 ug via INTRAVENOUS
  Administered 2024-05-07: 100 ug via INTRAVENOUS
  Administered 2024-05-08: 50 ug via INTRAVENOUS

## 2024-05-05 MED ORDER — POLYETHYLENE GLYCOL 3350 17 G PO PACK
17.0000 g | PACK | Freq: Every day | ORAL | Status: DC
  Administered 2024-05-06: 17 g
  Filled 2024-05-05: qty 1

## 2024-05-05 NOTE — ED Triage Notes (Signed)
 Pt bib EMS from home, daughter found unresponsive on her CPAP in her bed. CPR started by fire with initial rhythm asystole. 3 rounds of CPR and epi given, pulses back at 2321 after of CPR. IGEL in place but pt started to vomit and EMS ended up intubating en route. EPI gtt started with EMS but stopped on arrival to ED

## 2024-05-05 NOTE — ED Triage Notes (Incomplete)
 Pt bib EMS from home, daughter found unresponsive on her CPAP in her bed. CPR started by fire with initial rhythm asystole. 3 rounds of CPR and epi given, pulse

## 2024-05-06 ENCOUNTER — Inpatient Hospital Stay (HOSPITAL_COMMUNITY)

## 2024-05-06 ENCOUNTER — Encounter (HOSPITAL_COMMUNITY)

## 2024-05-06 ENCOUNTER — Emergency Department (HOSPITAL_COMMUNITY)

## 2024-05-06 DIAGNOSIS — R930 Abnormal findings on diagnostic imaging of skull and head, not elsewhere classified: Secondary | ICD-10-CM | POA: Diagnosis not present

## 2024-05-06 DIAGNOSIS — G931 Anoxic brain damage, not elsewhere classified: Secondary | ICD-10-CM | POA: Diagnosis not present

## 2024-05-06 DIAGNOSIS — I6782 Cerebral ischemia: Secondary | ICD-10-CM | POA: Diagnosis not present

## 2024-05-06 DIAGNOSIS — G935 Compression of brain: Secondary | ICD-10-CM | POA: Diagnosis not present

## 2024-05-06 DIAGNOSIS — I110 Hypertensive heart disease with heart failure: Secondary | ICD-10-CM | POA: Diagnosis present

## 2024-05-06 DIAGNOSIS — E7800 Pure hypercholesterolemia, unspecified: Secondary | ICD-10-CM | POA: Diagnosis present

## 2024-05-06 DIAGNOSIS — R0602 Shortness of breath: Secondary | ICD-10-CM | POA: Diagnosis not present

## 2024-05-06 DIAGNOSIS — J90 Pleural effusion, not elsewhere classified: Secondary | ICD-10-CM | POA: Diagnosis not present

## 2024-05-06 DIAGNOSIS — K5730 Diverticulosis of large intestine without perforation or abscess without bleeding: Secondary | ICD-10-CM | POA: Diagnosis not present

## 2024-05-06 DIAGNOSIS — R570 Cardiogenic shock: Secondary | ICD-10-CM | POA: Diagnosis not present

## 2024-05-06 DIAGNOSIS — G253 Myoclonus: Secondary | ICD-10-CM

## 2024-05-06 DIAGNOSIS — I469 Cardiac arrest, cause unspecified: Principal | ICD-10-CM

## 2024-05-06 DIAGNOSIS — I480 Paroxysmal atrial fibrillation: Secondary | ICD-10-CM | POA: Diagnosis present

## 2024-05-06 DIAGNOSIS — Z515 Encounter for palliative care: Secondary | ICD-10-CM | POA: Diagnosis not present

## 2024-05-06 DIAGNOSIS — Z800 Family history of malignant neoplasm of digestive organs: Secondary | ICD-10-CM | POA: Diagnosis not present

## 2024-05-06 DIAGNOSIS — R918 Other nonspecific abnormal finding of lung field: Secondary | ICD-10-CM | POA: Diagnosis not present

## 2024-05-06 DIAGNOSIS — J439 Emphysema, unspecified: Secondary | ICD-10-CM | POA: Diagnosis not present

## 2024-05-06 DIAGNOSIS — I5032 Chronic diastolic (congestive) heart failure: Secondary | ICD-10-CM | POA: Diagnosis present

## 2024-05-06 DIAGNOSIS — Z7951 Long term (current) use of inhaled steroids: Secondary | ICD-10-CM | POA: Diagnosis not present

## 2024-05-06 DIAGNOSIS — K3189 Other diseases of stomach and duodenum: Secondary | ICD-10-CM | POA: Diagnosis not present

## 2024-05-06 DIAGNOSIS — J984 Other disorders of lung: Secondary | ICD-10-CM | POA: Diagnosis not present

## 2024-05-06 DIAGNOSIS — R579 Shock, unspecified: Secondary | ICD-10-CM

## 2024-05-06 DIAGNOSIS — Z8249 Family history of ischemic heart disease and other diseases of the circulatory system: Secondary | ICD-10-CM | POA: Diagnosis not present

## 2024-05-06 DIAGNOSIS — N179 Acute kidney failure, unspecified: Secondary | ICD-10-CM

## 2024-05-06 DIAGNOSIS — Z452 Encounter for adjustment and management of vascular access device: Secondary | ICD-10-CM | POA: Diagnosis not present

## 2024-05-06 DIAGNOSIS — J9621 Acute and chronic respiratory failure with hypoxia: Secondary | ICD-10-CM | POA: Diagnosis not present

## 2024-05-06 DIAGNOSIS — J449 Chronic obstructive pulmonary disease, unspecified: Secondary | ICD-10-CM | POA: Diagnosis not present

## 2024-05-06 DIAGNOSIS — Z9981 Dependence on supplemental oxygen: Secondary | ICD-10-CM | POA: Diagnosis not present

## 2024-05-06 DIAGNOSIS — R569 Unspecified convulsions: Secondary | ICD-10-CM | POA: Diagnosis not present

## 2024-05-06 DIAGNOSIS — J9622 Acute and chronic respiratory failure with hypercapnia: Secondary | ICD-10-CM | POA: Diagnosis present

## 2024-05-06 DIAGNOSIS — G936 Cerebral edema: Secondary | ICD-10-CM | POA: Diagnosis not present

## 2024-05-06 DIAGNOSIS — Z7982 Long term (current) use of aspirin: Secondary | ICD-10-CM | POA: Diagnosis not present

## 2024-05-06 DIAGNOSIS — Z8049 Family history of malignant neoplasm of other genital organs: Secondary | ICD-10-CM | POA: Diagnosis not present

## 2024-05-06 DIAGNOSIS — Z803 Family history of malignant neoplasm of breast: Secondary | ICD-10-CM | POA: Diagnosis not present

## 2024-05-06 DIAGNOSIS — Z4682 Encounter for fitting and adjustment of non-vascular catheter: Secondary | ICD-10-CM | POA: Diagnosis not present

## 2024-05-06 DIAGNOSIS — E8729 Other acidosis: Secondary | ICD-10-CM | POA: Diagnosis present

## 2024-05-06 DIAGNOSIS — Z87891 Personal history of nicotine dependence: Secondary | ICD-10-CM | POA: Diagnosis not present

## 2024-05-06 DIAGNOSIS — G919 Hydrocephalus, unspecified: Secondary | ICD-10-CM | POA: Diagnosis not present

## 2024-05-06 LAB — BASIC METABOLIC PANEL WITH GFR
Anion gap: 16 — ABNORMAL HIGH (ref 5–15)
BUN: 21 mg/dL (ref 8–23)
CO2: 38 mmol/L — ABNORMAL HIGH (ref 22–32)
Calcium: 7.8 mg/dL — ABNORMAL LOW (ref 8.9–10.3)
Chloride: 86 mmol/L — ABNORMAL LOW (ref 98–111)
Creatinine, Ser: 1.02 mg/dL — ABNORMAL HIGH (ref 0.44–1.00)
GFR, Estimated: 59 mL/min — ABNORMAL LOW (ref >60)
Glucose, Bld: 225 mg/dL — ABNORMAL HIGH (ref 70–99)
Potassium: 3.8 mmol/L (ref 3.5–5.1)
Sodium: 140 mmol/L (ref 135–145)

## 2024-05-06 LAB — CBC WITH DIFFERENTIAL/PLATELET
Abs Immature Granulocytes: 0.7 10*3/uL — ABNORMAL HIGH (ref 0.00–0.07)
Basophils Absolute: 0 10*3/uL (ref 0.0–0.1)
Basophils Relative: 0 %
Eosinophils Absolute: 0.1 10*3/uL (ref 0.0–0.5)
Eosinophils Relative: 1 %
HCT: 39.1 % (ref 36.0–46.0)
Hemoglobin: 10.6 g/dL — ABNORMAL LOW (ref 12.0–15.0)
Immature Granulocytes: 8 %
Lymphocytes Relative: 21 %
Lymphs Abs: 2 10*3/uL (ref 0.7–4.0)
MCH: 29.5 pg (ref 26.0–34.0)
MCHC: 27.1 g/dL — ABNORMAL LOW (ref 30.0–36.0)
MCV: 108.9 fL — ABNORMAL HIGH (ref 80.0–100.0)
Monocytes Absolute: 0.8 10*3/uL (ref 0.1–1.0)
Monocytes Relative: 9 %
Neutro Abs: 5.6 10*3/uL (ref 1.7–7.7)
Neutrophils Relative %: 61 %
Platelets: 154 10*3/uL (ref 150–400)
RBC: 3.59 MIL/uL — ABNORMAL LOW (ref 3.87–5.11)
RDW: 13.2 % (ref 11.5–15.5)
Smear Review: NORMAL
WBC: 9.3 10*3/uL (ref 4.0–10.5)
nRBC: 0.4 % — ABNORMAL HIGH (ref 0.0–0.2)

## 2024-05-06 LAB — URINALYSIS, ROUTINE W REFLEX MICROSCOPIC
Bilirubin Urine: NEGATIVE
Glucose, UA: 50 mg/dL — AB
Ketones, ur: NEGATIVE mg/dL
Nitrite: NEGATIVE
Protein, ur: >=300 mg/dL — AB
Specific Gravity, Urine: 1.019 (ref 1.005–1.030)
pH: 5 (ref 5.0–8.0)

## 2024-05-06 LAB — I-STAT CHEM 8, ED
BUN: 23 mg/dL (ref 8–23)
Calcium, Ion: 0.92 mmol/L — ABNORMAL LOW (ref 1.15–1.40)
Chloride: 87 mmol/L — ABNORMAL LOW (ref 98–111)
Creatinine, Ser: 1.1 mg/dL — ABNORMAL HIGH (ref 0.44–1.00)
Glucose, Bld: 208 mg/dL — ABNORMAL HIGH (ref 70–99)
HCT: 38 % (ref 36.0–46.0)
Hemoglobin: 12.9 g/dL (ref 12.0–15.0)
Potassium: 4 mmol/L (ref 3.5–5.1)
Sodium: 137 mmol/L (ref 135–145)
TCO2: 34 mmol/L — ABNORMAL HIGH (ref 22–32)

## 2024-05-06 LAB — MAGNESIUM
Magnesium: 2.5 mg/dL — ABNORMAL HIGH (ref 1.7–2.4)
Magnesium: 2 mg/dL (ref 1.7–2.4)

## 2024-05-06 LAB — ECHOCARDIOGRAM COMPLETE
AV Mean grad: 3 mmHg
AV Peak grad: 7.3 mmHg
Ao pk vel: 1.35 m/s
Area-P 1/2: 2.56 cm2
Est EF: 40
Height: 63 in
Single Plane A4C EF: 39.8 %
Weight: 2920.65 [oz_av]

## 2024-05-06 LAB — I-STAT VENOUS BLOOD GAS, ED
Acid-Base Excess: 2 mmol/L (ref 0.0–2.0)
Bicarbonate: 33.7 mmol/L — ABNORMAL HIGH (ref 20.0–28.0)
Calcium, Ion: 0.93 mmol/L — ABNORMAL LOW (ref 1.15–1.40)
HCT: 36 % (ref 36.0–46.0)
Hemoglobin: 12.2 g/dL (ref 12.0–15.0)
O2 Saturation: 79 %
Potassium: 4 mmol/L (ref 3.5–5.1)
Sodium: 136 mmol/L (ref 135–145)
TCO2: 37 mmol/L — ABNORMAL HIGH (ref 22–32)
pCO2, Ven: 98.1 mmHg (ref 44–60)
pH, Ven: 7.144 — CL (ref 7.25–7.43)
pO2, Ven: 59 mmHg — ABNORMAL HIGH (ref 32–45)

## 2024-05-06 LAB — I-STAT CG4 LACTIC ACID, ED: Lactic Acid, Venous: 13.8 mmol/L (ref 0.5–1.9)

## 2024-05-06 LAB — GLUCOSE, CAPILLARY
Glucose-Capillary: 116 mg/dL — ABNORMAL HIGH (ref 70–99)
Glucose-Capillary: 123 mg/dL — ABNORMAL HIGH (ref 70–99)
Glucose-Capillary: 125 mg/dL — ABNORMAL HIGH (ref 70–99)
Glucose-Capillary: 133 mg/dL — ABNORMAL HIGH (ref 70–99)
Glucose-Capillary: 151 mg/dL — ABNORMAL HIGH (ref 70–99)
Glucose-Capillary: 212 mg/dL — ABNORMAL HIGH (ref 70–99)

## 2024-05-06 LAB — COMPREHENSIVE METABOLIC PANEL WITH GFR
ALT: 94 U/L — ABNORMAL HIGH (ref 0–44)
AST: 132 U/L — ABNORMAL HIGH (ref 15–41)
Albumin: 3.2 g/dL — ABNORMAL LOW (ref 3.5–5.0)
Alkaline Phosphatase: 82 U/L (ref 38–126)
Anion gap: 27 — ABNORMAL HIGH (ref 5–15)
BUN: 17 mg/dL (ref 8–23)
CO2: 25 mmol/L (ref 22–32)
Calcium: 8 mg/dL — ABNORMAL LOW (ref 8.9–10.3)
Chloride: 88 mmol/L — ABNORMAL LOW (ref 98–111)
Creatinine, Ser: 1.26 mg/dL — ABNORMAL HIGH (ref 0.44–1.00)
GFR, Estimated: 46 mL/min — ABNORMAL LOW (ref >60)
Glucose, Bld: 216 mg/dL — ABNORMAL HIGH (ref 70–99)
Potassium: 4.1 mmol/L (ref 3.5–5.1)
Sodium: 140 mmol/L (ref 135–145)
Total Bilirubin: 0.7 mg/dL (ref 0.0–1.2)
Total Protein: 6.1 g/dL — ABNORMAL LOW (ref 6.5–8.1)

## 2024-05-06 LAB — PHOSPHORUS: Phosphorus: 5.5 mg/dL — ABNORMAL HIGH (ref 2.5–4.6)

## 2024-05-06 LAB — RAPID URINE DRUG SCREEN, HOSP PERFORMED
Amphetamines: NOT DETECTED
Barbiturates: NOT DETECTED
Benzodiazepines: POSITIVE — AB
Cocaine: NOT DETECTED
Opiates: POSITIVE — AB
Tetrahydrocannabinol: NOT DETECTED

## 2024-05-06 LAB — POCT I-STAT 7, (LYTES, BLD GAS, ICA,H+H)
Acid-Base Excess: 16 mmol/L — ABNORMAL HIGH (ref 0.0–2.0)
Bicarbonate: 44.7 mmol/L — ABNORMAL HIGH (ref 20.0–28.0)
Calcium, Ion: 0.98 mmol/L — ABNORMAL LOW (ref 1.15–1.40)
HCT: 33 % — ABNORMAL LOW (ref 36.0–46.0)
Hemoglobin: 11.2 g/dL — ABNORMAL LOW (ref 12.0–15.0)
O2 Saturation: 98 %
Patient temperature: 36.3
Potassium: 3.6 mmol/L (ref 3.5–5.1)
Sodium: 138 mmol/L (ref 135–145)
TCO2: 47 mmol/L — ABNORMAL HIGH (ref 22–32)
pCO2 arterial: 74 mmHg (ref 32–48)
pH, Arterial: 7.386 (ref 7.35–7.45)
pO2, Arterial: 105 mmHg (ref 83–108)

## 2024-05-06 LAB — LIPASE, BLOOD: Lipase: 28 U/L (ref 11–51)

## 2024-05-06 LAB — MRSA NEXT GEN BY PCR, NASAL: MRSA by PCR Next Gen: NOT DETECTED

## 2024-05-06 LAB — TROPONIN I (HIGH SENSITIVITY)
Troponin I (High Sensitivity): 313 ng/L (ref <18)
Troponin I (High Sensitivity): 708 ng/L (ref <18)

## 2024-05-06 MED ORDER — INSULIN ASPART 100 UNIT/ML IJ SOLN
0.0000 [IU] | INTRAMUSCULAR | Status: DC
  Administered 2024-05-06: 3 [IU] via SUBCUTANEOUS
  Administered 2024-05-06 – 2024-05-07 (×4): 1 [IU] via SUBCUTANEOUS
  Administered 2024-05-07: 2 [IU] via SUBCUTANEOUS

## 2024-05-06 MED ORDER — DOCUSATE SODIUM 100 MG PO CAPS: 100.0000 mg | ORAL_CAPSULE | Freq: Two times a day (BID) | ORAL | Status: DC | PRN

## 2024-05-06 MED ORDER — LEVETIRACETAM (KEPPRA) 500 MG/5 ML ADULT IV PUSH
1000.0000 mg | Freq: Two times a day (BID) | INTRAVENOUS | Status: DC
  Administered 2024-05-06 – 2024-05-07 (×2): 1000 mg via INTRAVENOUS
  Filled 2024-05-06 (×2): qty 10

## 2024-05-06 MED ORDER — ORAL CARE MOUTH RINSE
15.0000 mL | OROMUCOSAL | Status: DC
  Administered 2024-05-06 – 2024-05-09 (×42): 15 mL via OROMUCOSAL

## 2024-05-06 MED ORDER — VASOPRESSIN 20 UNITS/100 ML INFUSION FOR SHOCK: 0.0000 [IU]/min | INTRAVENOUS | Status: DC

## 2024-05-06 MED ORDER — SODIUM CHLORIDE 0.9 % IV BOLUS
1000.0000 mL | Freq: Once | INTRAVENOUS | Status: AC
  Administered 2024-05-06: 1000 mL via INTRAVENOUS

## 2024-05-06 MED ORDER — FAMOTIDINE IN NACL 20-0.9 MG/50ML-% IV SOLN: 20.0000 mg | Freq: Two times a day (BID) | INTRAVENOUS | Status: DC

## 2024-05-06 MED ORDER — MIDAZOLAM HCL 2 MG/2ML IJ SOLN
INTRAMUSCULAR | Status: AC
  Filled 2024-05-06: qty 2

## 2024-05-06 MED ORDER — SODIUM CHLORIDE 0.9 % IV SOLN
2.0000 g | Freq: Two times a day (BID) | INTRAVENOUS | Status: DC
  Administered 2024-05-06 – 2024-05-09 (×8): 2 g via INTRAVENOUS
  Filled 2024-05-06 (×8): qty 12.5

## 2024-05-06 MED ORDER — PROPOFOL 1000 MG/100ML IV EMUL
5.0000 ug/kg/min | INTRAVENOUS | Status: DC
  Administered 2024-05-06: 5 ug/kg/min via INTRAVENOUS
  Administered 2024-05-06 (×5): 80 ug/kg/min via INTRAVENOUS
  Administered 2024-05-06 (×2): 50 ug/kg/min via INTRAVENOUS
  Administered 2024-05-07: 50.099 ug/kg/min via INTRAVENOUS
  Administered 2024-05-07 (×4): 50 ug/kg/min via INTRAVENOUS
  Filled 2024-05-06 (×4): qty 100
  Filled 2024-05-06: qty 200
  Filled 2024-05-06 (×7): qty 100

## 2024-05-06 MED ORDER — SODIUM CHLORIDE 0.9 % IV SOLN
2.0000 g | Freq: Once | INTRAVENOUS | Status: AC
  Administered 2024-05-06: 2 g via INTRAVENOUS
  Filled 2024-05-06: qty 20

## 2024-05-06 MED ORDER — FAMOTIDINE 20 MG PO TABS
20.0000 mg | ORAL_TABLET | Freq: Two times a day (BID) | ORAL | Status: DC
  Administered 2024-05-06 (×2): 20 mg
  Filled 2024-05-06 (×4): qty 1

## 2024-05-06 MED ORDER — VANCOMYCIN HCL 1500 MG/300ML IV SOLN
1500.0000 mg | Freq: Once | INTRAVENOUS | Status: AC
  Administered 2024-05-06: 1500 mg via INTRAVENOUS
  Filled 2024-05-06: qty 300

## 2024-05-06 MED ORDER — LACTATED RINGERS IV SOLN: INTRAVENOUS | Status: AC

## 2024-05-06 MED ORDER — LEVETIRACETAM (KEPPRA) 500 MG/5 ML ADULT IV PUSH
4500.0000 mg | INTRAVENOUS | Status: AC
Start: 2024-05-06 — End: 2024-05-06
  Administered 2024-05-06: 4500 mg via INTRAVENOUS

## 2024-05-06 MED ORDER — POLYETHYLENE GLYCOL 3350 17 G PO PACK: 17.0000 g | PACK | Freq: Every day | ORAL | Status: DC | PRN

## 2024-05-06 MED ORDER — EPINEPHRINE HCL 5 MG/250ML IV SOLN IN NS
0.5000 ug/min | INTRAVENOUS | Status: DC
  Administered 2024-05-06: 5 ug/min via INTRAVENOUS

## 2024-05-06 MED ORDER — HEPARIN SODIUM (PORCINE) 5000 UNIT/ML IJ SOLN
5000.0000 [IU] | Freq: Three times a day (TID) | INTRAMUSCULAR | Status: DC
  Administered 2024-05-06 – 2024-05-07 (×4): 5000 [IU] via SUBCUTANEOUS
  Filled 2024-05-06 (×4): qty 1

## 2024-05-06 MED ORDER — METRONIDAZOLE 500 MG/100ML IV SOLN
500.0000 mg | Freq: Once | INTRAVENOUS | Status: AC
  Administered 2024-05-06: 500 mg via INTRAVENOUS
  Filled 2024-05-06: qty 100

## 2024-05-06 MED ORDER — VALPROATE SODIUM 100 MG/ML IV SOLN
500.0000 mg | Freq: Three times a day (TID) | INTRAVENOUS | Status: DC
  Administered 2024-05-06 – 2024-05-07 (×3): 500 mg via INTRAVENOUS
  Filled 2024-05-06 (×6): qty 5

## 2024-05-06 MED ORDER — CALCIUM GLUCONATE-NACL 1-0.675 GM/50ML-% IV SOLN
1.0000 g | Freq: Once | INTRAVENOUS | Status: AC
  Administered 2024-05-06: 1000 mg via INTRAVENOUS
  Filled 2024-05-06: qty 50

## 2024-05-06 MED ORDER — CHLORHEXIDINE GLUCONATE CLOTH 2 % EX PADS
6.0000 | MEDICATED_PAD | Freq: Every day | CUTANEOUS | Status: DC
Start: 2024-05-06 — End: 2024-05-10
  Administered 2024-05-06 – 2024-05-09 (×2): 6 via TOPICAL

## 2024-05-06 MED ORDER — IOHEXOL 350 MG/ML SOLN
75.0000 mL | Freq: Once | INTRAVENOUS | Status: AC | PRN
  Administered 2024-05-06: 75 mL via INTRAVENOUS

## 2024-05-06 MED ORDER — SODIUM BICARBONATE 8.4 % IV SOLN
50.0000 meq | Freq: Once | INTRAVENOUS | Status: AC
  Administered 2024-05-06: 50 meq via INTRAVENOUS

## 2024-05-06 MED ORDER — PERFLUTREN LIPID MICROSPHERE
1.0000 mL | INTRAVENOUS | Status: AC | PRN
  Administered 2024-05-06: 3 mL via INTRAVENOUS

## 2024-05-06 MED ORDER — NOREPINEPHRINE 4 MG/250ML-% IV SOLN
0.0000 ug/min | INTRAVENOUS | Status: DC
  Administered 2024-05-06: 18 ug/min via INTRAVENOUS
  Administered 2024-05-06: 2 ug/min via INTRAVENOUS
  Administered 2024-05-06: 6 ug/min via INTRAVENOUS
  Filled 2024-05-06 (×2): qty 250

## 2024-05-06 MED ORDER — VANCOMYCIN HCL 750 MG/150ML IV SOLN
750.0000 mg | INTRAVENOUS | Status: DC
  Administered 2024-05-07: 750 mg via INTRAVENOUS
  Filled 2024-05-06: qty 150

## 2024-05-06 MED ORDER — MIDAZOLAM-SODIUM CHLORIDE 100-0.9 MG/100ML-% IV SOLN
0.5000 mg/h | INTRAVENOUS | Status: DC
  Administered 2024-05-06: 0.5 mg/h via INTRAVENOUS
  Filled 2024-05-06: qty 100

## 2024-05-06 MED ORDER — ORAL CARE MOUTH RINSE: 15.0000 mL | OROMUCOSAL | Status: DC | PRN

## 2024-05-06 MED ORDER — VALPROATE SODIUM 100 MG/ML IV SOLN
3000.0000 mg | INTRAVENOUS | Status: AC
  Administered 2024-05-06: 3000 mg via INTRAVENOUS
  Filled 2024-05-06: qty 30

## 2024-05-06 MED ORDER — SODIUM CHLORIDE 0.9 % IV BOLUS
500.0000 mL | Freq: Once | INTRAVENOUS | Status: DC
Start: 2024-05-06 — End: 2024-05-10

## 2024-05-06 NOTE — Procedures (Signed)
 Central Venous Catheter Insertion Procedure Note  ARANTXA CARDIN  [id number]  1951/10/15  Date:05/06/24  Time:5:28 AM   Provider Performing:Talah Cookston Charon Copper   Procedure: Insertion of Non-tunneled Central Venous Catheter(36556) with US  guidance (78295)   Indication(s) Medication administration  Consent Risks of the procedure as well as the alternatives and risks of each were explained to the patient and/or caregiver.  Consent for the procedure was obtained and is signed in the bedside chart  Anesthesia Topical only with 1% lidocaine    Timeout Verified patient identification, verified procedure, site/side was marked, verified correct patient position, special equipment/implants available, medications/allergies/relevant history reviewed, required imaging and test results available.  Sterile Technique Maximal sterile technique including full sterile barrier drape, hand hygiene, sterile gown, sterile gloves, mask, hair covering, sterile ultrasound probe cover (if used).  Procedure Description Area of catheter insertion was cleaned with chlorhexidine  and draped in sterile fashion.  With real-time ultrasound guidance a central venous catheter was placed into the left internal jugular vein. Nonpulsatile blood flow and easy flushing noted in all ports.  The catheter was sutured in place and sterile dressing applied.  Complications/Tolerance None; patient tolerated the procedure well. Chest X-ray is ordered to verify placement for internal jugular or subclavian cannulation.   Chest x-ray is not ordered for femoral cannulation.  EBL Minimal  Specimen(s) None

## 2024-05-06 NOTE — Progress Notes (Signed)
LTM EEG hooked up and running with CT compatible leads. [facility] monitoring

## 2024-05-06 NOTE — Consult Note (Addendum)
 NEUROLOGY CONSULT NOTE   Date of service: May 06, 2024 Patient Name: [name] MRN:  [id number] DOB:  1951-04-10 Chief Complaint: "Abnormal movements" Requesting Provider: Roberts Ching, MD  History of Present Illness  [name] is a 73 y.o. female who was found down and unresponsive and received bystander CPR.  Downtime is therefore unclear but received at least 15 minutes of CPR.  Following intubation, she began having abnormal movements and therefore neurology has been consulted.  Past History   Past Medical History:  Diagnosis Date   Acute respiratory failure with hypoxia and hypercapnia (HCC) 07/19/2023   Anxiety    Atrophic vaginitis 10/09/2015   Baker's cyst    left leg   Blood in urine 10/09/2015   CHF (congestive heart failure) (HCC)    COPD (chronic obstructive pulmonary disease) (HCC)    Heart murmur    Hematuria 10/09/2015   Hypertension    Nicotine  addiction 04/07/2014   Oxygen  dependent    4L/min [place] all the time   Pneumonia    Urinary retention 07/01/2020   Urinary urgency 01/25/2016   Vaginal bleeding 10/09/2015    Past Surgical History:  Procedure Laterality Date   ABDOMINAL HYSTERECTOMY     CATARACT EXTRACTION Left 03/2015   CESAREAN SECTION     COLONOSCOPY  10/19/2004   Dr. Riley Cheadle- internal hemorrhoids, o/w normal rectum, normal colon   COLONOSCOPY N/A 07/30/2014   [id number] diverticulosis. Colonic polyp-removed TA. repeat TCS 07/2021   COLONOSCOPY WITH PROPOFOL  N/A 03/29/2021   diverticulosis in sigmoid and descending colon, one 5 mm polyp ascending colon (adenoma), non-bleeding internal hemorrhoids. 5 year surveillance   ESOPHAGOGASTRODUODENOSCOPY N/A 07/30/2014   NUU:[id number] EGD   POLYPECTOMY  03/29/2021   Procedure: POLYPECTOMY;  Surgeon: Suzette Espy, MD;  Location: [facility];  Service: Endoscopy;;   RADIAL HEAD ARTHROPLASTY Left 06/23/2017   Procedure: RADIAL HEAD ARTHROPLASTY WITH LIGAMENT REPAIR;  Surgeon: Jasmine Mesi, MD;   Location: [facility];  Service: Orthopedics;  Laterality: Left;    Family History: Family History  Adopted: Yes  Problem Relation Age of Onset   Cancer Sister        pancreatic   Migraines Daughter    Cancer Daughter        pre cancerous cells on cervix   Cancer Sister        breast cancer, colon   Blindness Maternal Grandfather    Other Brother        murdered   Other Sister        ruptured colon   Cancer Sister        breast, skin   Other Brother        was in MVA   Cancer Sister        pancreatic   Hypertension Sister    Other Sister        ruptured colon   Colon cancer Neg Hx     Social History  reports that she quit smoking about 11 years ago. Her smoking use included cigarettes. She started smoking about 51 years ago. She has been exposed to tobacco smoke. She has never used smokeless tobacco. She reports current alcohol use. She reports that she does not use drugs.  Allergies  Allergen Reactions   Neurontin  [Gabapentin ] Other (See Comments)    Lightheadedness  Groggy     Medications   Current Facility-Administered Medications:    calcium  gluconate 1 g/ 50 mL sodium chloride  IVPB, 1 g,  Intravenous, Once, Long, Joshua G, MD   docusate (COLACE) 50 MG/5ML liquid 100 mg, 100 mg, Per Tube, BID, Long, Joshua G, MD   EPINEPHrine  (ADRENALIN ) 5 mg in NS 250 mL (0.02 mg/mL) premix infusion, 0.5-20 mcg/min, Intravenous, Titrated, Long, Shereen Dike, MD, Last Rate: 15 mL/hr at 05/06/24 0114, 5 mcg/min at 05/06/24 0114   fentaNYL  (SUBLIMAZE ) bolus via infusion 25-100 mcg, 25-100 mcg, Intravenous, Q15 min PRN, Long, Joshua G, MD   fentaNYL  in NS (71mcg/ml) infusion-PREMIX, 25-200 mcg/hr, Intravenous, Continuous, Long, Shereen Dike, MD, Last Rate: 7.5 mL/hr at 05/06/24 0114, 75 mcg/hr at 05/06/24 0114   lactated ringers  infusion, , Intravenous, Continuous, Long, Joshua G, MD   metroNIDAZOLE (FLAGYL) IVPB 500 mg, 500 mg, Intravenous, Once, Long, Shereen Dike, MD, Last Rate: 100  mL/hr at 05/06/24 0125, 500 mg at 05/06/24 0125   midazolam  (VERSED ) injection 1-2 mg, 1-2 mg, Intravenous, Q1H PRN, Long, Joshua G, MD, 2 mg at 05/06/24 0056   norepinephrine  (LEVOPHED ) 4mg  in (0.016 mg/mL) premix infusion, 0-40 mcg/min, Intravenous, Titrated, Long, Shereen Dike, MD, Last Rate: 56.3 mL/hr at 05/06/24 0114, 15 mcg/min at 05/06/24 0114   polyethylene glycol (MIRALAX  / GLYCOLAX ) packet 17 g, 17 g, Per Tube, Daily, Long, Joshua G, MD   propofol  (DIPRIVAN ) 1000 MG/100ML infusion, 5-80 mcg/kg/min, Intravenous, Continuous, Long, Shereen Dike, MD   sodium chloride  0.9 % bolus 500 mL, 500 mL, Intravenous, Once, Long, Joshua G, MD  Current Outpatient Medications:    albuterol  (VENTOLIN  HFA) 108 (90 Base) MCG/ACT inhaler, Inhale 2 puffs into the lungs every 4 (four) hours as needed for wheezing or shortness of breath. For shortness of breath, Disp: 18 g, Rfl: 6   ALPRAZolam  (XANAX ) 0.25 MG tablet, Take 0.25 mg by mouth 2 (two) times daily as needed., Disp: , Rfl:    aspirin  EC 81 MG tablet, Take 81 mg by mouth daily., Disp: , Rfl:    atorvastatin  (LIPITOR ) 80 MG tablet, Take 1 tablet (80 mg total) by mouth daily., Disp: , Rfl:    bethanechol  (URECHOLINE ) 10 MG tablet, Take 2 tablets (20 mg total) by mouth daily., Disp: 180 tablet, Rfl: 3   budesonide  (PULMICORT ) 0.25 MG/2ML nebulizer solution, Take 2 mLs (0.25 mg total) by nebulization 2 (two) times daily. (Patient taking differently: Take 0.25 mg by nebulization 2 (two) times daily as needed (for shortness of breath).), Disp: 360 mL, Rfl: 0   Fluticasone -Umeclidin-Vilant (TRELEGY ELLIPTA ) 100-62.5-25 MCG/ACT AEPB, Inhale 1 puff into the lungs daily., Disp: , Rfl:    furosemide  (LASIX ) 20 MG tablet, Take 1 tablet (20 mg total) by mouth daily., Disp: , Rfl:    HYDROcodone -acetaminophen  (NORCO) 10-325 MG tablet, Take 1 tablet by mouth every 6 (six) hours as needed for moderate pain (pain score 4-6). 1 tablet every 3-4 hours as needed for pain.,  Disp: , Rfl:    ipratropium-albuterol  (DUONEB) 0.5-2.5 (3) MG/3ML SOLN, Take 3 mLs by nebulization in the morning, at noon, in the evening, and at bedtime. (Patient taking differently: Take 3 mLs by nebulization every 6 (six) hours as needed (For shortness of breath).), Disp: 1080 mL, Rfl: 0   metoprolol  tartrate (LOPRESSOR ) 25 MG tablet, Take 1 tablet (25 mg total) by mouth 2 (two) times daily., Disp: 180 tablet, Rfl: 0   mirtazapine  (REMERON ) 7.5 MG tablet, Take 7.5 mg by mouth at bedtime., Disp: , Rfl:    Multiple Vitamins-Minerals (CENTRUM ADULT PO), Take 1 tablet by mouth daily., Disp: , Rfl:  pantoprazole  (PROTONIX ) 20 MG tablet, Take 1 tablet (20 mg total) by mouth daily., Disp: , Rfl:    tamsulosin  (FLOMAX ) 0.4 MG CAPS capsule, Take 1 capsule (0.4 mg total) by mouth in the morning and at bedtime., Disp: 180 capsule, Rfl: 3  Vitals   Vitals:    0110 May 09, 2024 0115  0120 09-May-2024 0125  BP: (!) 130/98   (!) 116/49  Pulse: 85 82 81 81  Resp: 20 19 (!) 32 18  SpO2: 94% 97% 98% 97%  Weight:    84.5 kg  Height:        Body mass index is 33 kg/m.  Physical Exam   Intubated  Neurologic Examination    Neuro: Mental Status: She does not open eyes or follow commands Cranial Nerves: II: Pupils are very small, but they are reactive bilaterally III,IV, VI: Doll's maneuver is intact V, VII: Corneals are intact X: Checking cough elicits myoclonus  Motor: No response to noxious stimulation Sensory: As above  Cerebellar: Does not perform  She has repeated episodes of generalized myoclonus with a few seconds of quiescence followed by repeated generalized myoclonus       Labs/Imaging/Neurodiagnostic studies   CBC:  Recent Labs  Lab  0004  0006  WBC  --  9.3  NEUTROABS  --  PENDING  HGB 12.9  12.2 10.6*  HCT 38.0  36.0 39.1  MCV  --  108.9*  PLT  --  154   Basic Metabolic Panel:  Lab Results  Component Value Date   NA 140     K 4.1 09-May-2024   CO2 25 May 09, 2024   GLUCOSE 216 (H)    BUN 17    CREATININE 1.26 (H)    CALCIUM  8.0 (L)    GFRNONAA 46 (L)    GFRAA >60 06/24/2020   Lipid Panel:  Lab Results  Component Value Date   LDLCALC 84 06/30/2022   HgbA1c:  Lab Results  Component Value Date   HGBA1C 5.0 11/12/2023   INR  Lab Results  Component Value Date   INR 1.2 06/29/2022   APTT  Lab Results  Component Value Date   APTT 29 06/29/2022    CT Head without contrast(Personally reviewed): Suspect there are some early signs of basal ganglia insult  ASSESSMENT   [name] is a 74 y.o. female found down in cardiac arrest with early myoclonic movements.  If the CT changes truly do represent hypoxic injury, as I suspect they do, I think that she very likely has a dismal prognosis.  This would be supported by the early myoclonic changes.  Her rapid EEG does not reveal any ongoing seizure activity in between her myoclonic jerks, but rather it appears to be clinical.  She is on Keppra and Depacon, and I would titrate propofol  to movement cessation, though I doubt this will significantly change her prognosis.  RECOMMENDATIONS  Keppra 1 g twice daily Depacon 500 mg twice daily Repeat head CT in the morning ______________________________________________________________________   This patient is critically ill and at significant risk of neurological worsening, death and care requires constant monitoring of vital signs, hemodynamics,respiratory and cardiac monitoring, neurological assessment, discussion with family, other specialists and medical decision making of high complexity. I spent 65 minutes of neurocritical care time  in the care of  this patient. This was time spent independent of any time provided by nurse practitioner or PA.  Ann Keto, MD [facility]   If 7pm- 7am, please page neurology on  call as  listed in AMION. 05/06/2024  4:33 AM

## 2024-05-06 NOTE — Progress Notes (Signed)
*  PRELIMINARY RESULTS* Echocardiogram 2D Echocardiogram has been performed.  [name] 05/06/2024, 2:24 PM

## 2024-05-06 NOTE — Procedures (Addendum)
 Patient Name: [name]  MRN: [id number]  Epilepsy Attending: Arleene Lack  Referring Physician/Provider: Dr Ann Keto Duration: 05/06/2024 3329 to 5188  Patient history:  73 y.o. female found down in cardiac arrest with early myoclonic movements. EEG to evaluate for seizure  Level of alertness: comatose  AEDs during EEG study: LEV, VPA, Propofol , Versed   Technical aspects: This EEG was obtained using a 10 lead EEG system positioned circumferentially without any parasagittal coverage (rapid EEG). Computer selected EEG is reviewed as  well as background features and all clinically significant events.  Description: At the beginning of the study, EEG showed burst suppression pattern with bursts of high amplitude polymorphic 3-5 hz theta-delta slowing lasting 8 to 10 seconds alternating with 10 to 15 seconds of generalized EEG suppression.  Of note, no video is available to see if patient had any jerking during bursts.  After 4am, sedation was adjusted, EEG then showed near continuous generalized background suppression, not reactive to stimulation.  Hyperventilation and photic stimulation were not performed.     ABNORMALITY - Burst suppression, generalized  IMPRESSION: This limited ceribell EEG was suggestive of severe to profound diffuse encephalopathy.  No video is available to see if patient had any jerking.  Therefore clinical correlation is recommended.  As sedation was adjusted, after around 4 AM EEG was suggestive of profound diffuse encephalopathy.  Yanai Hobson O Sarabi Sockwell

## 2024-05-06 NOTE — Progress Notes (Signed)
 Pharmacy Antibiotic Note  [name] is a 73 y.o. female admitted on 05/05/2024 with cardiac arrest, seizure activity, and shock.  Pharmacy has been consulted for vancomycin  and cefepime  dosing.  Plan: Vancomycin  1500mg  x1 then 750mg  IV Q24H. Goal AUC 400-550.  Expected AUC 450. Cefepime  2g IV Q12H.  Height: 5\' 3"  (160 cm) Weight: 82.8 kg (182 lb 8.7 oz) IBW/kg (Calculated) : 52.4  Temp (24hrs), Avg:97.3 F (36.3 C), Min:97.2 F (36.2 C), Max:97.5 F (36.4 C)  Recent Labs  Lab 05/06/24 0004 05/06/24 0005 05/06/24 0006 05/06/24 0401  WBC  --   --  9.3  --   CREATININE 1.10*  --  1.26* 1.02*  LATICACIDVEN  --  13.8*  --   --     Estimated Creatinine Clearance: 52.3 mL/min (A) (by C-G formula based on SCr of 1.02 mg/dL (H)).    Allergies  Allergen Reactions   Neurontin  [Gabapentin ] Other (See Comments)    Lightheadedness  Groggy     Thank you for allowing pharmacy to be a part of this patient's care.  Lonnie Roberts, PharmD, BCPS  05/06/2024 5:23 AM

## 2024-05-06 NOTE — Progress Notes (Signed)
 NEUROLOGY CONSULT FOLLOW UP NOTE   Date of service: May 06, 2024 Patient Name: [name] MRN:  [id number] DOB:  October 07, 1968  Interval Hx/subjective   Seen in room, EEG being connected.  No family at the bedside.  She is sedated with propofol .  Currently requiring vasopressor support as well. Lactic acid on arrival 13.8, troponin 708. Blood and respiratory cultures pending. CT head later this morning.   Current AEDs:  Depacon 500 mg q8 Keppra 1000 mg q12  Current Sedation: Versed - 1mg /hr Propofol  - 80mcg/kg/min  Current Abx: Cefepime  Vancomycin   Vitals   Vitals:   05/06/24 0628 05/06/24 0630 05/06/24 0645 05/06/24 0700  BP:      Pulse: 62 62 63 61  Resp: 19 19 19 20   Temp: 97.7 F (36.5 C) 97.7 F (36.5 C) 97.7 F (36.5 C) 97.7 F (36.5 C)  SpO2: 99% 99% 99% 99%  Weight:      Height:         Body mass index is 32.34 kg/m.  Physical Exam   Constitutional: Appears well-developed and well-nourished.  Psych: Unresponsive Eyes: No scleral injection.  HENT: ET tube in place Head: Normocephalic.  Cardiovascular: Normal rate and regular rhythm.  Requiring vasopressor support Respiratory: Mechanically ventilated GI: Soft.  No distension.  OG in place Skin: Intact on clothed exam  Neurologic Examination   Neuro: Mental Status: Patient is sedated, intubated.  Not following commands Cranial Nerves: II: Pupils are pinpoint, sluggish III,IV, VI: Eyes are midline VII: obstructed by ETT VIII: No response to verbal stimuli X: No cough or gag, sedation is infusing XI: Head is midline XII: Does not follow commands Motor/sensory: Flaccid, no movement to noxious stimuli  Medications  Current Facility-Administered Medications:    ceFEPIme  (MAXIPIME ) 2 g in sodium chloride  0.9 % 100 mL IVPB, 2 g, Intravenous, Q12H, Bryk, Veronda P, RPH, Last Rate: 200 mL/hr at 05/06/24 0700, Infusion Verify at 05/06/24 0700   Chlorhexidine  Gluconate Cloth 2 % PADS 6 each, 6  each, Topical, Daily, Lezlie Reddish, DO, 6 each at 05/06/24 0300   docusate (COLACE) 50 MG/5ML liquid 100 mg, 100 mg, Per Tube, BID, Long, Joshua G, MD   docusate sodium  (COLACE) capsule 100 mg, 100 mg, Oral, BID PRN, Gleason, Patt Boozer, PA-C   EPINEPHrine  (ADRENALIN ) 5 mg in NS 250 mL (0.02 mg/mL) premix infusion, 0.5-20 mcg/min, Intravenous, Titrated, Long, Joshua G, MD, Stopped at 05/06/24 4098   famotidine  (PEPCID ) tablet 20 mg, 20 mg, Per Tube, BID, Gleason, Patt Boozer, PA-C   fentaNYL  (SUBLIMAZE ) bolus via infusion 25-100 mcg, 25-100 mcg, Intravenous, Q15 min PRN, Long, Joshua G, MD   fentaNYL  in NS 250mL (14mcg/ml) infusion-PREMIX, 25-200 mcg/hr, Intravenous, Continuous, Long, Shereen Dike, MD, Last Rate: 10 mL/hr at 05/06/24 0700, 100 mcg/hr at 05/06/24 0700   heparin  injection 5,000 Units, 5,000 Units, Subcutaneous, Q8H, Gleason, Patt Boozer, PA-C, 5,000 Units at 05/06/24 0529   insulin  aspart (novoLOG ) injection 0-9 Units, 0-9 Units, Subcutaneous, Q4H, Gleason, Patt Boozer, PA-C, 3 Units at 05/06/24 0454   lactated ringers  infusion, , Intravenous, Continuous, Long, Shereen Dike, MD, Stopped at 05/06/24 0640   levETIRAcetam (KEPPRA) undiluted injection 1,000 mg, 1,000 mg, Intravenous, Q12H, Augustin Leber, MD   midazolam  (VERSED ) 100 mg/100 mL (1 mg/mL) premix infusion, 0.5-10 mg/hr, Intravenous, Continuous, Gleason, Patt Boozer, PA-C, Last Rate: 1 mL/hr at 05/06/24 0700, 1 mg/hr at 05/06/24 0700   midazolam  (VERSED ) injection 1-2 mg, 1-2 mg, Intravenous, Q1H PRN, Long, Joshua G, MD, 2  mg at 05/06/24 0056   norepinephrine  (LEVOPHED ) 4mg  in (0.016 mg/mL) premix infusion, 0-40 mcg/min, Intravenous, Titrated, Long, Shereen Dike, MD, Last Rate: 18.75 mL/hr at 05/06/24 0700, 5 mcg/min at 05/06/24 0700   Oral care mouth rinse, 15 mL, Mouth Rinse, Q2H, Lezlie Reddish, DO, 15 mL at 05/06/24 9147   Oral care mouth rinse, 15 mL, Mouth Rinse, PRN, Lezlie Reddish, DO   polyethylene glycol (MIRALAX  /  GLYCOLAX ) packet 17 g, 17 g, Per Tube, Daily, Long, Shereen Dike, MD   polyethylene glycol (MIRALAX  / GLYCOLAX ) packet 17 g, 17 g, Oral, Daily PRN, Gleason, Patt Boozer, PA-C   propofol  (DIPRIVAN ) 1000 MG/100ML infusion, 5-80 mcg/kg/min, Intravenous, Continuous, Long, Shereen Dike, MD, Last Rate: 40.6 mL/hr at 05/06/24 0752, 80 mcg/kg/min at 05/06/24 0752   sodium chloride  0.9 % bolus 500 mL, 500 mL, Intravenous, Once, Long, Shereen Dike, MD   valproate (DEPACON) 500 mg in dextrose  5 % 50 mL IVPB, 500 mg, Intravenous, Q8H, Augustin Leber, MD   [START ON 05/24/2024] vancomycin  (VANCOREADY) IVPB 750 mg/150 mL, 750 mg, Intravenous, Q24H, Bryk, Veronda P, RPH   vasopressin (PITRESSIN) 20 Units in 100 mL (0.2 unit/mL) infusion-*FOR SHOCK*, 0-0.03 Units/min, Intravenous, Continuous, Gleason, Patt Boozer, PA-C  Labs and Diagnostic Imaging   CBC:  Recent Labs  Lab 05/06/24 0006 05/06/24 0422  WBC 9.3  --   NEUTROABS 5.6  --   HGB 10.6* 11.2*  HCT 39.1 33.0*  MCV 108.9*  --   PLT 154  --     Basic Metabolic Panel:  Lab Results  Component Value Date   NA 138 05/06/2024   K 3.6 05/06/2024   CO2 38 (H) 05/06/2024   GLUCOSE 225 (H) 05/06/2024   BUN 21 05/06/2024   CREATININE 1.02 (H) 05/06/2024   CALCIUM  7.8 (L) 05/06/2024   GFRNONAA 59 (L) 05/06/2024   GFRAA >60 06/24/2020   Lipid Panel:  Lab Results  Component Value Date   LDLCALC 84 06/30/2022   HgbA1c:  Lab Results  Component Value Date   HGBA1C 5.0 11/12/2023   Urine Drug Screen:     Component Value Date/Time   LABOPIA POSITIVE (A) 05/06/2024 0211   COCAINSCRNUR NONE DETECTED 05/06/2024 0211   LABBENZ POSITIVE (A) 05/06/2024 0211   AMPHETMU NONE DETECTED 05/06/2024 0211   THCU NONE DETECTED 05/06/2024 0211   LABBARB NONE DETECTED 05/06/2024 0211   Lactic Acid, Venous    Component Value Date/Time   LATICACIDVEN 13.8 (HH) 05/06/2024 0005   INR  Lab Results  Component Value Date   INR 1.2 06/29/2022   APTT  Lab Results   Component Value Date   APTT 29 06/29/2022   CT Head without contrast(Personally reviewed): Suspect there are some early signs of basal ganglia insult   Repeat CT Head 1. Marked brain edema and global mass effect has progressed. 2. Downward herniation of the cerebellum into the foramen magnum. 3. Developing hydrocephalus of the lateral and third ventricles with moderate prominence of the temporal tips. 4. Increased prominence of the tentorium and cortex gives the appearance of hemorrhage, so-called pseudo subarachnoid hemorrhage. 5. Hypoxic ischemic injury of the caudate head bilaterally is again noted.  Ceribell: 05/06/2024 0317 to 0841  This limited ceribell EEG was suggestive of severe to profound diffuse encephalopathy.  No video is available to see if patient had any jerking.  Therefore clinical correlation is recommended.  As sedation was adjusted, after around 4 AM EEG was suggestive of profound diffuse encephalopathy.  rEEG:  05/06/2024  This study was suggestive of severe to profound diffuse encephalopathy.  No definite seizures were noted.   cEEG:  pending  Assessment   [name] is a 73 y.o. female with a PMH of COPD on 2L home O2, CHF, HTN, chronic pain who was found unresponsive in bed with her CPAP on by a family member.  Downtime is therefore unclear but received at least 15 minutes of CPR by EMS. Vomited in route to the hospital and intubated by EMS. After intubation she had early myoclonic movements.  CT head on arrival already showing concern for anoxic injury.  She received 4.5 g of Keppra in the emergency department and is now on 1000 mg of Keppra every 12 hours.  She received 3 g of Depakote in the ED and is now on 500 g every 8 hours.   Current AEDs:  Depacon 500 mg q8 Keppra 1000 mg q12  Current Sedation: Versed  infusion Propofol  infusion  Current Abx: Cefepime  Vancomycin   Recommendations  - Repeat CT around 1000 - Continue keppra 1000mg  q12 -  Continue depacon 500mg  q8 - MRI 72 hours post arrest - Consider switching cefepime  due to neuro toxicity ______________________________________________________________________   Arletha Bend, NP [facility]   Attending addendum Patient seen and examined History and imaging personally reviewed Extremely poor exam with very minimal reactivity of bilateral pupils. Repeat CT head reveals marked brain edema and global mass effect that has progressed from the earlier CT head.  Now there is also evidence of downward herniation of the cerebellum into the foramen magnum with developing hydrocephalus of the lateral and third ventricles with moderate prominence of the temporal tips.  There is increased prominence of the tentorium and cortex giving the appearance of hemorrhage the so-called pseudo subarachnoid appearance-this is strongly suspicious for anoxic brain injury. Her clinical picture and imaging are both very concerning for anoxic brain injury and I do not think that she might be able to survive this anoxic brain insult. I am recommending having discussions with family for withdrawal of care and comfort measures only at this time. Until those calls are made, continue current scope of treatment, but it is my opinion that treatment at this time is likely going to be futile. Plan was discussed with Dr. Gaynell Keeler  -- Tona Francis, MD Neurologist [facility]  CRITICAL CARE ATTESTATION Performed by: Tona Francis, MD Critical care time: additional 45 minutes Critical care time was exclusive of separately billable procedures and treating other patients and/or supervising APPs/Residents/Students Critical care was necessary to treat or prevent imminent or life-threatening deterioration. This patient is critically ill and at significant risk for neurological worsening and/or death and care requires constant monitoring. Critical care was time spent personally by me on  the following activities: development of treatment plan with patient and/or surrogate as well as nursing, discussions with consultants, evaluation of patient's response to treatment, examination of patient, obtaining history from patient or surrogate, ordering and performing treatments and interventions, ordering and review of laboratory studies, ordering and review of radiographic studies, pulse oximetry, re-evaluation of patient's condition, participation in multidisciplinary rounds and medical decision making of high complexity in the care of this patient.

## 2024-05-06 NOTE — Plan of Care (Signed)
 I spoke with patient's sister Charlotta Cook and updated her with patient's current status and the likelihood that this will not be a survivable event.  She expressed understanding and relayed that pt's daughter lives with her and is her healthcare POA, however has suffered a stroke herself and has difficulty with speech. Dianne asked that we continue full support for now and the family will try meet and visit later today   Patt Boozer Ozzie Remmers, PA-C

## 2024-05-06 NOTE — Progress Notes (Signed)
Routine EEG complete. Results pending.

## 2024-05-06 NOTE — Progress Notes (Signed)
 Called family  Spoke with patient's sister  Patient's daughter was not able to come to the phone  Discussed CT scan finding, worsening evidence of brain injury  I did inform them that there is a lot of brain injury that makes a prognosis very grim and likely nonsurvivable  Patient's sister-[name] says she will talk with the daughter and other family members and call us  back  I did reiterate that the prognosis currently is grim because there is evidence of herniation

## 2024-05-06 NOTE — Procedures (Signed)
 Patient Name: KHALEIGH GOTTFRIED  MRN: [id number]  Epilepsy Attending: Arleene Lack  Referring Physician/Provider: Gleason, Patt Boozer, PA-C  Date: 05/06/2024 Duration: 24.20 mins  Patient history:  73 y.o. female found down in cardiac arrest with early myoclonic movements. EEG to evaluate for seizure   Level of alertness: comatose  AEDs during EEG study:  LEV, VPA, Propofol , Versed    Technical aspects: This EEG study was done with scalp electrodes positioned according to the 10-20 International system of electrode placement. Electrical activity was reviewed with band pass filter of 1-70Hz , sensitivity of 7 uV/mm, display speed of 51mm/sec with a 60Hz  notched filter applied as appropriate. EEG data were recorded continuously and digitally stored.  Video monitoring was available and reviewed as appropriate.  Description: EEG showed burst suppression pattern with bursts of 3-5 hz theta-delta slowing lasting 1-2 seconds alternating with  6-60 seconds of generalized EEG suppression. Hyperventilation and photic stimulation were not performed.     ABNORMALITY - Burst suppression, generalized   IMPRESSION: This study was suggestive of severe to profound diffuse encephalopathy.  No definite seizures were noted.   Ariellah Faust O Jalynn Betzold

## 2024-05-06 NOTE — Progress Notes (Signed)
PT was transported to CT scan and back without complications.

## 2024-05-06 NOTE — Procedures (Signed)
 Arterial Catheter Insertion Procedure Note  [name]  [id number]  Jan 18, 1951  Date:05/06/24  Time:5:27 AM    Provider Performing: Lezlie Reddish    Procedure: Insertion of Arterial Line (94854) with US  guidance (62703)   Indication(s) Blood pressure monitoring and/or need for frequent ABGs  Consent Unable to obtain consent due to emergent nature of procedure.  Anesthesia None   Time Out Verified patient identification, verified procedure, site/side was marked, verified correct patient position, special equipment/implants available, medications/allergies/relevant history reviewed, required imaging and test results available.   Sterile Technique Maximal sterile technique including full sterile barrier drape, hand hygiene, sterile gown, sterile gloves, mask, hair covering, sterile ultrasound probe cover (if used).   Procedure Description Area of catheter insertion was cleaned with chlorhexidine  and draped in sterile fashion. With real-time ultrasound guidance an arterial catheter was placed into the left radial artery.  Appropriate arterial tracings confirmed on monitor.     Complications/Tolerance None; patient tolerated the procedure well.   EBL Minimal   Specimen(s) None

## 2024-05-06 NOTE — H&P (Signed)
 NAME:  [name], MRN:  [id number], DOB:  [birth year], LOS: 0 ADMISSION DATE:  05/05/2024, CONSULTATION DATE:  05/06/24 REFERRING MD:  EDP, CHIEF COMPLAINT:  cardiac arrest   History of Present Illness:  73 y.o. F with PMH significant for COPD on 2L home O2, CHF, HTN, chronic pain who was found unresponsive in bed with her CPAP on by a family member per report. Unknown down time, but CPR started by EMS with ROSC after three rounds of ACLS.  Pt was unresponsive post-rosc and had severe seizure-like/myoclonic jerking in the ED.  CTH with  symmetric hypodensities involving the caudate nuclei bilaterally and CXR with R basilar disease. Labs were significant for creainine 1.26, lactic acid 13.8  Pertinent  Medical History   has a past medical history of Acute respiratory failure with hypoxia and hypercapnia (HCC) (07/19/2023), Anxiety, Atrophic vaginitis (10/09/2015), Baker's cyst, Blood in urine (10/09/2015), CHF (congestive heart failure) (HCC), COPD (chronic obstructive pulmonary disease) (HCC), Heart murmur, Hematuria (10/09/2015), Hypertension, Nicotine  addiction (04/07/2014), Oxygen  dependent, Pneumonia, Urinary retention (07/01/2020), Urinary urgency (01/25/2016), and Vaginal bleeding (10/09/2015).   Significant Hospital Events: Including procedures, antibiotic start and stop dates in addition to other pertinent events   5/12 admit to ICU post-arrest  Interim History / Subjective:  As above   Objective    Blood pressure (!) 119/46, pulse 82, resp. rate 20, height 5\' 3"  (1.6 m), weight 84.5 kg, SpO2 94%.    Vent Mode: PRVC FiO2 (%):  [60 %] 60 % Set Rate:  [18 bmp] 18 bmp Vt Set:  [420 mL] 420 mL PEEP:  [5 cmH20] 5 cmH20   Intake/Output Summary (Last 24 hours) at 05/06/2024 0154 Last data filed at 05/06/2024 0114 Gross per 24 hour  Intake 64.22 ml  Output --  Net 64.22 ml   Filed Weights   05/06/24 0125  Weight: 84.5 kg    General:  critically ill-appearing F intubated  and sedated HEENT: MM pink/moist, ETT in place Neuro: episodic whole body myoclonic jerking, overbreathing vent, not responsive to pain or stimuli, PERRLA  CV: s1s2 rrr, no m/r/g PULM:  scattered rhonchi bilaterally  GI: soft, bsx4 active  Extremities: warm/dry, no edema     Resolved Hospital Problem list     Assessment & Plan:   Unwitnessed out of hospital cardiac arrest  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 -HOB elevated 30 degrees. -Plateau pressures less than 30 cm H20.  -Follow chest x-ray, ABG prn.   -Bronchial hygiene and RT/bronchodilator protocol. -norepi to maintain MAP >65 -normothermia, euglycemia   AKI, mildly elevated LFT's -likely secondary to shock -IVF, supportive care and trend labs -I & O and avoid nephrotoxins     Best Practice (right click and "Reselect all SmartList Selections" daily)   Diet/type: NPO DVT prophylaxis LMWH Pressure ulcer(s): N/A GI prophylaxis: H2B Lines: N/A Foley:  Yes, and it is still needed Code Status:  full code Last date of multidisciplinary goals of care discussion [Pt's sister updated 5/12]  Labs   CBC: Recent Labs  Lab 05/06/24 0004 05/06/24 0006  WBC  --  9.3  NEUTROABS  --  PENDING  HGB 12.9  12.2 10.6*  HCT 38.0  36.0 39.1  MCV  --  108.9*  PLT  --  154    Basic Metabolic Panel: Recent Labs  Lab 05/06/24 0004 05/06/24 0006  NA 137  136 140  K 4.0  4.0 4.1  CL 87* 88*  CO2  --  25  GLUCOSE 208* 216*  BUN 23 17  CREATININE 1.10* 1.26*  CALCIUM   --  8.0*   MG  --  2.5*   GFR: Estimated Creatinine Clearance: 42.8 mL/min (A) (by C-G formula based on SCr of 1.26 mg/dL (H)). Recent Labs  Lab 05/06/24 0005 05/06/24 0006  WBC  --  9.3  LATICACIDVEN 13.8*  --     Liver Function Tests: Recent Labs  Lab 05/06/24 0006  AST 132*  ALT 94*  ALKPHOS 82  BILITOT 0.7  PROT 6.1*  ALBUMIN 3.2*   Recent Labs  Lab 05/06/24 0006  LIPASE 28   No results for input(s): "AMMONIA" in the last 168 hours.  ABG    Component Value Date/Time   PHART 7.552 (H) 11/13/2023 0818   PCO2ART 51.8 (H) 11/13/2023 0818   PO2ART 88 11/13/2023 0818   HCO3 33.7 (H) 05/06/2024 0004   TCO2 37 (H) 05/06/2024 0004   TCO2 34 (H) 05/06/2024 0004   O2SAT 79 05/06/2024 0004     Coagulation Profile: No results for input(s): "INR", "PROTIME" in the last 168 hours.  Cardiac Enzymes: No results for input(s): "CKTOTAL", "CKMB", "CKMBINDEX", "TROPONINI" in the last 168 hours.  HbA1C: Hgb A1c MFr Bld  Date/Time Value Ref Range Status  11/12/2023 04:36 PM 5.0 4.8 - 5.6 % Final    Comment:    (NOTE) Pre diabetes:          5.7%-6.4%  Diabetes:              >6.4%  Glycemic control for   <7.0% adults with diabetes   07/01/2022 02:07 AM 5.4 4.8 - 5.6 % Final    Comment:    (NOTE) Pre diabetes:          5.7%-6.4%  Diabetes:              >6.4%  Glycemic control for   <7.0% adults with diabetes     CBG: No results for input(s): "GLUCAP" in the last 168 hours.  Review of Systems:   Unable to obtain   Past Medical History:  She,  has a past medical history of Acute respiratory failure with hypoxia and hypercapnia (HCC) (07/19/2023), Anxiety, Atrophic vaginitis (10/09/2015), Baker's cyst, Blood in urine (10/09/2015), CHF (congestive heart failure) (HCC), COPD (chronic obstructive pulmonary disease) (HCC), Heart murmur, Hematuria (10/09/2015), Hypertension, Nicotine  addiction (04/07/2014), Oxygen  dependent, Pneumonia, Urinary retention (07/01/2020), Urinary  urgency (01/25/2016), and Vaginal bleeding (10/09/2015).   Surgical History:   Past Surgical History:  Procedure Laterality Date   ABDOMINAL HYSTERECTOMY     CATARACT EXTRACTION Left 03/2015   CESAREAN SECTION     COLONOSCOPY  10/19/2004   Dr. Riley Cheadle- internal hemorrhoids, o/w normal rectum, normal colon   COLONOSCOPY N/A 07/30/2014   YQM:VHQIONG diverticulosis. Colonic polyp-removed TA. repeat TCS 07/2021   COLONOSCOPY WITH PROPOFOL  N/A 03/29/2021   diverticulosis in sigmoid and descending colon, one 5 mm polyp ascending colon (adenoma), non-bleeding internal hemorrhoids. 5 year surveillance   ESOPHAGOGASTRODUODENOSCOPY N/A 07/30/2014   EXB:MWUXLK EGD   POLYPECTOMY  03/29/2021   Procedure: POLYPECTOMY;  Surgeon: Suzette Espy, MD;  Location: [facility]ITE;  Service: Endoscopy;;   RADIAL HEAD ARTHROPLASTY Left 06/23/2017   Procedure: RADIAL HEAD ARTHROPLASTY WITH LIGAMENT REPAIR;  Surgeon: Jasmine Mesi, MD;  Location: [facility] OR;  Service: Orthopedics;  Laterality: Left;     Social History:   reports that she quit smoking about 11 years ago. Her smoking use included cigarettes. She started smoking about 51 years ago. She has been exposed to tobacco smoke. She has never used smokeless tobacco. She reports current alcohol use. She reports that she does not use drugs.   Family History:  Her family history includes Blindness in her maternal grandfather; Cancer in her daughter, sister, sister, sister, and sister; Hypertension in her sister; Migraines in her daughter; Other in her brother, brother, sister, and sister. There is no history of Colon cancer. She was adopted.   Allergies Allergies  Allergen Reactions   Neurontin  [Gabapentin ] Other (See Comments)    Lightheadedness  Groggy      Home Medications  Prior to Admission medications   Medication Sig Start Date End Date Taking? Authorizing Provider  albuterol  (VENTOLIN  HFA) 108 (90 Base) MCG/ACT inhaler Inhale 2 puffs into the lungs  every 4 (four) hours as needed for wheezing or shortness of breath. For shortness of breath 08/14/23   Diamond Formica, MD  ALPRAZolam  (XANAX ) 0.25 MG tablet Take 0.25 mg by mouth 2 (two) times daily as needed. 12/14/23   [provider]  aspirin  EC 81 MG tablet Take 81 mg by mouth daily.    [provider]  atorvastatin  (LIPITOR ) 80 MG tablet Take 1 tablet (80 mg total) by mouth daily. 07/07/22   Gonfa, Taye T, MD  bethanechol  (URECHOLINE ) 10 MG tablet Take 2 tablets (20 mg total) by mouth daily. 11/10/23   McKenzie, Arden Beck, MD  budesonide  (PULMICORT ) 0.25 MG/2ML nebulizer solution Take 2 mLs (0.25 mg total) by nebulization 2 (two) times daily. Patient taking differently: Take 0.25 mg by nebulization 2 (two) times daily as needed (for shortness of breath). 10/16/23 01/14/24  Unk Garb, DO  Fluticasone -Umeclidin-Vilant (TRELEGY ELLIPTA ) 100-62.5-25 MCG/ACT AEPB Inhale 1 puff into the lungs daily. 02/27/24   Diamond Formica, MD  furosemide  (LASIX ) 20 MG tablet Take 1 tablet (20 mg total) by mouth daily. 11/17/23   Haydee Lipa, MD  HYDROcodone -acetaminophen  (NORCO) 10-325 MG tablet Take 1 tablet by mouth every 6 (six) hours as needed for moderate pain (pain score 4-6). 1 tablet every 3-4 hours as needed for pain. 07/14/22   [provider]  ipratropium-albuterol  (DUONEB) 0.5-2.5 (3) MG/3ML SOLN Take 3 mLs by nebulization in the morning, at noon, in the evening, and at bedtime. Patient taking differently: Take 3 mLs by nebulization every 6 (six) hours as needed (For shortness of breath). 10/16/23 01/14/24  Unk Garb, DO  metoprolol  tartrate (LOPRESSOR ) 25 MG tablet Take 1 tablet (25 mg total) by mouth 2 (two) times daily. 11/20/23 02/18/24  Diamond Formica, MD  mirtazapine  (REMERON ) 7.5 MG tablet Take 7.5 mg by mouth at bedtime.    [provider]  Multiple Vitamins-Minerals (CENTRUM ADULT PO) Take 1 tablet by mouth daily.    [provider]   pantoprazole  (PROTONIX ) 20 MG tablet Take 1 tablet (20 mg total) by mouth daily. 07/08/22   Gonfa, Taye T, MD  tamsulosin  (FLOMAX ) 0.4 MG CAPS capsule Take 1 capsule (0.4 mg total) by mouth in the morning and at bedtime. 03/29/24   McKenzie, Arden Beck, MD     Critical  care time: 60 minutes     CRITICAL CARE Performed by: Patt Boozer Char Feltman   Total critical care time: 60 minutes  Critical care time was exclusive of separately billable procedures and treating other patients.  Critical care was necessary to treat or prevent imminent or life-threatening deterioration.  Critical care was time spent personally by me on the following activities: development of treatment plan with patient and/or surrogate as well as nursing, discussions with consultants, evaluation of patient's response to treatment, examination of patient, obtaining history from patient or surrogate, ordering and performing treatments and interventions, ordering and review of laboratory studies, ordering and review of radiographic studies, pulse oximetry and re-evaluation of patient's condition.    Patt Boozer Cal Gindlesperger, PA-C [facility] See Amion for pager If no response to pager , please call [phone] until 7pm After 7:00 pm call Elink  [phone]

## 2024-05-06 NOTE — ED Provider Notes (Signed)
 Emergency Department Provider Note   I have reviewed the triage vital signs and the nursing notes.   HISTORY  Chief Complaint No chief complaint on file.   HPI [name] is a 73 y.o. female with past history of COPD, CHF, hypertension, prior UTI with severe sepsis presents with unwitnessed cardiac arrest at home.  EMS report they were called to the house by the patient's daughter who went to check on her while sleeping and found her unresponsive.  Unknown downtime.  EMS arrived to find her in asystole with high end-tidal.  She received 3 rounds of epi and CPR with ROSC on scene.  Soft BP on arrival causing EMS to start an epi drip.  She was intubated prior to hospital arrival as well with a 7-0 ET tube.  Level 5 caveat: Unresponsive  Past Medical History:  Diagnosis Date   Acute respiratory failure with hypoxia and hypercapnia (HCC) 07/19/2023   Anxiety    Atrophic vaginitis 10/09/2015   Baker's cyst    left leg   Blood in urine 10/09/2015   CHF (congestive heart failure) (HCC)    COPD (chronic obstructive pulmonary disease) (HCC)    Heart murmur    Hematuria 10/09/2015   Hypertension    Nicotine  addiction 04/07/2014   Oxygen  dependent    4L/min [place] all the time   Pneumonia    Urinary retention 07/01/2020   Urinary urgency 01/25/2016   Vaginal bleeding 10/09/2015    Review of Systems  Level 5 caveat: Unresponsive.   ____________________________________________   PHYSICAL EXAM:  VITAL SIGNS: Vitals:   05/06/24 0130 05/06/24 0145  BP: (!) 122/51 (!) 119/46  Pulse: 83 82  Resp: (!) 21 20  SpO2: 96% 94%   Constitutional: Unresponsive with occasional, spontaneous respirations.  Eyes: Conjunctivae are normal.  Head: Atraumatic. Nose: No congestion/rhinnorhea. Mouth/Throat: Mucous membranes are moist. Neck: No stridor.  Cardiovascular: Bradycardia. Good peripheral circulation. Grossly normal heart sounds.   Respiratory: Occasional respiratory effort.   No retractions. Lungs tight and diminished at the bases.  Gastrointestinal: Soft and nontender. Positive distention.  Musculoskeletal: No gross deformities of extremities. Neurologic: Unresponsive. Pupils are 3 mm and fixed. No spontaneous extremity movement.  Skin:  Skin is warm, dry and intact. No rash noted.  ____________________________________________   LABS (all labs ordered are listed, but only abnormal results are displayed)  Labs Reviewed  COMPREHENSIVE METABOLIC PANEL WITH GFR - Abnormal; Notable for the following components:      Result Value   Chloride 88 (*)    Glucose, Bld 216 (*)    Creatinine, Ser 1.26 (*)    Calcium  8.0 (*)    Total Protein 6.1 (*)    Albumin 3.2 (*)    AST 132 (*)    ALT 94 (*)    GFR, Estimated 46 (*)    Anion gap 27 (*)    All other components within normal limits  CBC WITH DIFFERENTIAL/PLATELET - Abnormal; Notable for the following components:   RBC 3.59 (*)    Hemoglobin 10.6 (*)    MCV 108.9 (*)    MCHC 27.1 (*)    nRBC 0.4 (*)    All other components within normal limits  MAGNESIUM  - Abnormal; Notable for the following components:   Magnesium  2.5 (*)    All other components within normal limits  I-STAT VENOUS BLOOD GAS, ED - Abnormal; Notable for the following components:   pH, Ven 7.144 (*)    pCO2, Ven 98.1 (*)  pO2, Ven 59 (*)    Bicarbonate 33.7 (*)    TCO2 37 (*)    Calcium , Ion 0.93 (*)    All other components within normal limits  I-STAT CG4 LACTIC ACID, ED - Abnormal; Notable for the following components:   Lactic Acid, Venous 13.8 (*)    All other components within normal limits  I-STAT CHEM 8, ED - Abnormal; Notable for the following components:   Chloride 87 (*)    Creatinine, Ser 1.10 (*)    Glucose, Bld 208 (*)    Calcium , Ion 0.92 (*)    TCO2 34 (*)    All other components within normal limits  TROPONIN I (HIGH SENSITIVITY) - Abnormal; Notable for the following components:   Troponin I (High Sensitivity)  313 (*)    All other components within normal limits  CULTURE, BLOOD (ROUTINE X 2)  CULTURE, BLOOD (ROUTINE X 2)  MRSA NEXT GEN BY PCR, NASAL  LIPASE, BLOOD  RAPID URINE DRUG SCREEN, HOSP PERFORMED  URINALYSIS, ROUTINE W REFLEX MICROSCOPIC  CBC  CREATININE, SERUM  CBC  BASIC METABOLIC PANEL WITH GFR  BLOOD GAS, ARTERIAL  MAGNESIUM   PHOSPHORUS  I-STAT CG4 LACTIC ACID, ED  TROPONIN I (HIGH SENSITIVITY)   ____________________________________________  EKG   EKG Interpretation Date/Time:  Sunday May 05 2024 23:56:59 EDT Ventricular Rate:  53 PR Interval:    QRS Duration:  137 QT Interval:  533 QTC Calculation: 501 R Axis:   89  Text Interpretation: AV block, complete (third degree) IVCD, consider atypical RBBB Anteroseptal infarct, old Confirmed by Abby Hocking [phone]) on 05/06/2024 12:21:35 AM        ____________________________________________  RADIOLOGY  DG Chest Portable 1 View Result Date: 05/06/2024 CLINICAL DATA:  Shortness of breath. EXAM: PORTABLE CHEST 1 VIEW COMPARISON:  November 13, 2023 FINDINGS: An endotracheal tube is seen with its distal tip approximately 8.4 cm from the carina. And enteric tube is in place. Its distal tip is not visualized. The distal side hole sits just above the expected level of the gastroesophageal junction. The cardiac silhouette is mildly enlarged and unchanged in size. Stable moderate severity airspace disease is seen within the right lung base. There is a small right pleural effusion. No pneumothorax is identified. The visualized skeletal structures are unremarkable. IMPRESSION: 1. Endotracheal tube and enteric tube positioning, as described above. Advancement of the enteric tube by approximately 5 cm is recommended to decrease the risk of aspiration. 2. Stable moderate severity right basilar airspace disease. Correlation with nonemergent chest CT is recommended to further exclude the presence of an underlying neoplastic process. 3. Small  right pleural effusion. Electronically Signed   By: Virgle Grime M.D.   On: 05/06/2024 01:45    ____________________________________________   PROCEDURES  Procedure(s) performed:   Procedures  CRITICAL CARE Performed by: Roberts Ching Total critical care time: 90 minutes Critical care time was exclusive of separately billable procedures and treating other patients. Critical care was necessary to treat or prevent imminent or life-threatening deterioration. Critical care was time spent personally by me on the following activities: development of treatment plan with patient and/or surrogate as well as nursing, discussions with consultants, evaluation of patient's response to treatment, examination of patient, obtaining history from patient or surrogate, ordering and performing treatments and interventions, ordering and review of laboratory studies, ordering and review of radiographic studies, pulse oximetry and re-evaluation of patient's condition.  Abby Hocking, MD Emergency Medicine  ____________________________________________   INITIAL IMPRESSION / ASSESSMENT AND  PLAN / ED COURSE  Pertinent labs & imaging results that were available during my care of the patient were reviewed by me and considered in my medical decision making (see chart for details).   This patient is Presenting for Evaluation of post arrest, which does require a range of treatment options, and is a complaint that involves a high risk of morbidity and mortality.  The Differential Diagnoses include respiratory arrest, sepsis, AKI, ICH, etc.  Critical Interventions-    Medications  docusate (COLACE) 50 MG/5ML liquid 100 mg (has no administration in time range)  polyethylene glycol (MIRALAX  / GLYCOLAX ) packet 17 g (has no administration in time range)  fentaNYL  in NS 250mL (10mcg/ml) infusion-PREMIX (75 mcg/hr Intravenous Infusion Verify 05/06/24 0114)  fentaNYL  (SUBLIMAZE ) bolus via infusion 25-100  mcg (has no administration in time range)  midazolam  (VERSED ) injection 1-2 mg (2 mg Intravenous Given 05/06/24 0056)  sodium chloride  0.9 % bolus 500 mL (has no administration in time range)  lactated ringers  infusion (has no administration in time range)  norepinephrine  (LEVOPHED ) 4mg  in (0.016 mg/mL) premix infusion (15 mcg/min Intravenous Infusion Verify 05/06/24 0114)  metroNIDAZOLE (FLAGYL) IVPB 500 mg (500 mg Intravenous New Bag/Given 05/06/24 0125)  EPINEPHrine  (ADRENALIN ) 5 mg in NS 250 mL (0.02 mg/mL) premix infusion (5 mcg/min Intravenous Infusion Verify 05/06/24 0157)  calcium  gluconate 1 g/ 50 mL sodium chloride  IVPB (has no administration in time range)  propofol  (DIPRIVAN ) 1000 MG/100ML infusion (25 mcg/kg/min  84.5 kg Intravenous Infusion Verify 05/06/24 0158)  docusate sodium  (COLACE) capsule 100 mg (has no administration in time range)  polyethylene glycol (MIRALAX  / GLYCOLAX ) packet 17 g (has no administration in time range)  heparin  injection 5,000 Units (has no administration in time range)  famotidine  (PEPCID ) tablet 20 mg (has no administration in time range)  insulin  aspart (novoLOG ) injection 0-9 Units (has no administration in time range)  Chlorhexidine  Gluconate Cloth 2 % PADS 6 each (has no administration in time range)  Oral care mouth rinse (has no administration in time range)  Oral care mouth rinse (has no administration in time range)  fentaNYL  (SUBLIMAZE ) injection 25 mcg (25 mcg Intravenous Given 05/06/24 0011)  sodium bicarbonate injection 50 mEq (50 mEq Intravenous Given 05/06/24 0042)  sodium chloride  0.9 % bolus 1,000 mL (1,000 mLs Intravenous New Bag/Given 05/06/24 0053)  cefTRIAXone  (ROCEPHIN ) 2 g in sodium chloride  0.9 % 100 mL IVPB (0 g Intravenous Stopped 05/06/24 0123)  levETIRAcetam (KEPPRA) undiluted injection 4,500 mg (4,500 mg Intravenous Given 05/06/24 0137)    Reassessment after intervention: Starting NE peripherally with slightly low BP.     I did obtain Additional Historical Information from EMS.   I decided to review pertinent External Data, and in summary followed.   Clinical Laboratory Tests Ordered, included patient with significant lactic acidosis in the setting of CPR.  Doubt sepsis clinically.  No leukocytosis.  VBG shows significant respiratory acidosis with elevated CO2 and pH of 7.1.  Electrolytes normal.  No acute kidney injury.  Troponin elevated to 313 in the setting of CPR and active chest compressions.  Radiologic Tests Ordered, included CXR. I independently interpreted the images and agree with radiology interpretation.   Cardiac Monitor Tracing which shows bradycardia.    Social Determinants of Health Risk patient is not an active smoker.   Consult complete with Cardiology. Suspect metabolic etiology to explain high grade block. Will correct acidosis and they can consult as needed.   Neurology, Dr. Alecia Ames. Patient with myoclonic status.  Keppra ordered and they will follow.   ICU, Dr. Charon Copper. Plan for ICU admit.   Medical Decision Making: Summary:  Patient arrives to the ED post-arrest with unknown down-time. Sounds like respiratory arrest clinically. Respiratory acidosis on arrival. Bedside ECHO without severe reduced EF or pericardial effusion.   Reevaluation with patient. Improved HR and BP with Epi drip. Transitioning off NE infusion. Covering for possible infection with Rocephin  and Flagyl. Patient better sedated with Propofol  now.   Patient's presentation is most consistent with acute presentation with potential threat to life or bodily function.   Disposition: admit  ____________________________________________  FINAL CLINICAL IMPRESSION(S) / ED DIAGNOSES  Final diagnoses:  Cardiac arrest [facility])    Note:  This document was prepared using Dragon voice recognition software and may include unintentional dictation errors.  Abby Hocking, MD, [facility] Emergency Medicine    Shirla Hodgkiss, Shereen Dike, MD 05/06/24 [phone]

## 2024-05-06 NOTE — Progress Notes (Signed)
 NAME:  [name], MRN:  [id number], DOB:  10-14-1951, LOS: 0 ADMISSION DATE:  05/05/2024, CONSULTATION DATE:  05/06/24 REFERRING MD:  EDP, CHIEF COMPLAINT:  cardiac arrest   History of Present Illness:  73 y.o. F with PMH significant for COPD on 2L home O2, CHF, HTN, chronic pain who was found unresponsive in bed with her CPAP on by a family member per report. Unknown down time, but CPR started by EMS with ROSC after three rounds of ACLS.  Pt was unresponsive post-rosc and had severe seizure-like/myoclonic jerking in the ED.  CTH with  symmetric hypodensities involving the caudate nuclei bilaterally and CXR with R basilar disease. Labs were significant for creainine 1.26, lactic acid 13.8  Pertinent  Medical History   has a past medical history of Acute respiratory failure with hypoxia and hypercapnia (HCC) (07/19/2023), Anxiety, Atrophic vaginitis (10/09/2015), Baker's cyst, Blood in urine (10/09/2015), CHF (congestive heart failure) (HCC), COPD (chronic obstructive pulmonary disease) (HCC), Heart murmur, Hematuria (10/09/2015), Hypertension, Nicotine  addiction (04/07/2014), Oxygen  dependent, Pneumonia, Urinary retention (07/01/2020), Urinary urgency (01/25/2016), and Vaginal bleeding (10/09/2015).   Significant Hospital Events: Including procedures, antibiotic start and stop dates in addition to other pertinent events   5/12 admit to ICU post-arrest  Interim History / Subjective:  Seen this morning, remains intubated and sedated  Objective    Blood pressure (!) 127/51, pulse 61, temperature 97.7 F (36.5 C), resp. rate 20, height 5\' 3"  (1.6 m), weight 82.8 kg, SpO2 99%.    Vent Mode: PRVC FiO2 (%):  [60 %] 60 % Set Rate:  [18 bmp-22 bmp] 22 bmp Vt Set:  [420 mL] 420 mL PEEP:  [5 cmH20] 5 cmH20   Intake/Output Summary (Last 24 hours) at 05/06/2024 0740 Last data filed at 05/06/2024 0700 Gross per 24 hour  Intake 1185.15 ml  Output 210 ml  Net 975.15 ml   Filed Weights    05/06/24 0125 05/06/24 0300  Weight: 84.5 kg 82.8 kg    General:  critically ill-appearing F intubated and sedated HEENT: MM pink/moist, ETT in place Neuro: episodic whole body myoclonic jerking, overbreathing vent, not responsive to pain or stimuli, PERRLA  CV: s1s2 rrr, no m/r/g PULM:  scattered rhonchi bilaterally  GI: soft, bsx4 active  Extremities: warm/dry, no edema     Resolved Hospital Problem list     Assessment & Plan:   Unwitnessed out of hospital cardiac arrest  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  Acute on chronic hypoxic respiratory failure in setting of baseline COPD Shock, likely distributive vs cardiogenic  Unknown down time, but found pulseless  -CT Head reveals symmetric hypodensities, hypoxic brain injury vs artifact, recommending MRI -loaded with keppra, on propofol  - management per neurology -broad spectrum abx and cultures are pending -CT chest shows progressive chronic scarring, superimposed infection not excluded - Will need to discuss goals of care with family today to determine next steps  --Maintain full vent support with SAT/SBT as tolerated -titrate Vent setting to maintain SpO2 greater than or equal to 90%. -Follow chest x-ray, ABG prn.   -Bronchial hygiene and RT/bronchodilator protocol. -norepi to maintain MAP >65 -normothermia, euglycemia   AKI, mildly elevated LFT's -likely secondary to shock -IVF, supportive care and trend labs -I & O and avoid nephrotoxins     Best Practice (right click and "Reselect all SmartList Selections" daily)   Diet/type: NPO DVT prophylaxis LMWH Pressure ulcer(s): N/A GI prophylaxis: H2B Lines: N/A Foley:  Yes, and it is still needed Code Status:  full code  Last date of multidisciplinary goals of care discussion [Pt's sister updated 5/12]  Labs   CBC: Recent Labs  Lab 05/06/24 0004 05/06/24 0006 05/06/24 0422  WBC  --  9.3  --   NEUTROABS  --  5.6  --   HGB 12.9  12.2 10.6* 11.2*  HCT 38.0  36.0  39.1 33.0*  MCV  --  108.9*  --   PLT  --  154  --     Basic Metabolic Panel: Recent Labs  Lab 05/06/24 0004 05/06/24 0006 05/06/24 0401 05/06/24 0422  NA 137  136 140 140 138  K 4.0  4.0 4.1 3.8 3.6  CL 87* 88* 86*  --   CO2  --  25 38*  --   GLUCOSE 208* 216* 225*  --   BUN 23 17 21   --   CREATININE 1.10* 1.26* 1.02*  --   CALCIUM   --  8.0* 7.8*  --   MG  --  2.5* 2.0  --   PHOS  --   --  5.5*  --    GFR: Estimated Creatinine Clearance: 52.3 mL/min (A) (by C-G formula based on SCr of 1.02 mg/dL (H)). Recent Labs  Lab 05/06/24 0005 05/06/24 0006  WBC  --  9.3  LATICACIDVEN 13.8*  --     Liver Function Tests: Recent Labs  Lab 05/06/24 0006  AST 132*  ALT 94*  ALKPHOS 82  BILITOT 0.7  PROT 6.1*  ALBUMIN 3.2*   Recent Labs  Lab 05/06/24 0006  LIPASE 28   No results for input(s): "AMMONIA" in the last 168 hours.  ABG    Component Value Date/Time   PHART 7.386 05/06/2024 0422   PCO2ART 74.0 (HH) 05/06/2024 0422   PO2ART 105 05/06/2024 0422   HCO3 44.7 (H) 05/06/2024 0422   TCO2 47 (H) 05/06/2024 0422   O2SAT 98 05/06/2024 0422     Coagulation Profile: No results for input(s): "INR", "PROTIME" in the last 168 hours.  Cardiac Enzymes: No results for input(s): "CKTOTAL", "CKMB", "CKMBINDEX", "TROPONINI" in the last 168 hours.  HbA1C: Hgb A1c MFr Bld  Date/Time Value Ref Range Status  11/12/2023 04:36 PM 5.0 4.8 - 5.6 % Final    Comment:    (NOTE) Pre diabetes:          5.7%-6.4%  Diabetes:              >6.4%  Glycemic control for   <7.0% adults with diabetes   07/01/2022 02:07 AM 5.4 4.8 - 5.6 % Final    Comment:    (NOTE) Pre diabetes:          5.7%-6.4%  Diabetes:              >6.4%  Glycemic control for   <7.0% adults with diabetes     CBG: Recent Labs  Lab 05/06/24 0307  GLUCAP 212*    Review of Systems:   Unable to obtain   Past Medical History:  She,  has a past medical history of Acute respiratory failure with  hypoxia and hypercapnia (HCC) (07/19/2023), Anxiety, Atrophic vaginitis (10/09/2015), Baker's cyst, Blood in urine (10/09/2015), CHF (congestive heart failure) (HCC), COPD (chronic obstructive pulmonary disease) (HCC), Heart murmur, Hematuria (10/09/2015), Hypertension, Nicotine  addiction (04/07/2014), Oxygen  dependent, Pneumonia, Urinary retention (07/01/2020), Urinary urgency (01/25/2016), and Vaginal bleeding (10/09/2015).   Surgical History:   Past Surgical History:  Procedure Laterality Date   ABDOMINAL HYSTERECTOMY     CATARACT EXTRACTION Left 03/2015  CESAREAN SECTION     COLONOSCOPY  10/19/2004   Dr. Riley Cheadle- internal hemorrhoids, o/w normal rectum, normal colon   COLONOSCOPY N/A 07/30/2014   WUJ:[id number] diverticulosis. Colonic polyp-removed TA. repeat TCS 07/2021   COLONOSCOPY WITH PROPOFOL  N/A 03/29/2021   diverticulosis in sigmoid and descending colon, one 5 mm polyp ascending colon (adenoma), non-bleeding internal hemorrhoids. 5 year surveillance   ESOPHAGOGASTRODUODENOSCOPY N/A 07/30/2014   WGN:FAOZHY EGD   POLYPECTOMY  03/29/2021   Procedure: POLYPECTOMY;  Surgeon: Suzette Espy, MD;  Location: [facility] SUITE;  Service: Endoscopy;;   RADIAL HEAD ARTHROPLASTY Left 06/23/2017   Procedure: RADIAL HEAD ARTHROPLASTY WITH LIGAMENT REPAIR;  Surgeon: Jasmine Mesi, MD;  Location: [facility] OR;  Service: Orthopedics;  Laterality: Left;     Social History:   reports that she quit smoking about 11 years ago. Her smoking use included cigarettes. She started smoking about 51 years ago. She has been exposed to tobacco smoke. She has never used smokeless tobacco. She reports current alcohol use. She reports that she does not use drugs.   Family History:  Her family history includes Blindness in her maternal grandfather; Cancer in her daughter, sister, sister, sister, and sister; Hypertension in her sister; Migraines in her daughter; Other in her brother, brother, sister, and sister. There is no history  of Colon cancer. She was adopted.   Allergies Allergies  Allergen Reactions   Neurontin  [Gabapentin ] Other (See Comments)    Lightheadedness  Groggy      Home Medications  Prior to Admission medications   Medication Sig Start Date End Date Taking? Authorizing Provider  albuterol  (VENTOLIN  HFA) 108 (90 Base) MCG/ACT inhaler Inhale 2 puffs into the lungs every 4 (four) hours as needed for wheezing or shortness of breath. For shortness of breath 08/14/23   Diamond Formica, MD  ALPRAZolam  (XANAX ) 0.25 MG tablet Take 0.25 mg by mouth 2 (two) times daily as needed. 12/14/23   [provider]  aspirin  EC 81 MG tablet Take 81 mg by mouth daily.    [provider]  atorvastatin  (LIPITOR ) 80 MG tablet Take 1 tablet (80 mg total) by mouth daily. 07/07/22   Gonfa, Taye T, MD  bethanechol  (URECHOLINE ) 10 MG tablet Take 2 tablets (20 mg total) by mouth daily. 11/10/23   McKenzie, Arden Beck, MD  budesonide  (PULMICORT ) 0.25 MG/2ML nebulizer solution Take 2 mLs (0.25 mg total) by nebulization 2 (two) times daily. Patient taking differently: Take 0.25 mg by nebulization 2 (two) times daily as needed (for shortness of breath). 10/16/23 01/14/24  Unk Garb, DO  Fluticasone -Umeclidin-Vilant (TRELEGY ELLIPTA ) 100-62.5-25 MCG/ACT AEPB Inhale 1 puff into the lungs daily. 02/27/24   Diamond Formica, MD  furosemide  (LASIX ) 20 MG tablet Take 1 tablet (20 mg total) by mouth daily. 11/17/23   Haydee Lipa, MD  HYDROcodone -acetaminophen  (NORCO) 10-325 MG tablet Take 1 tablet by mouth every 6 (six) hours as needed for moderate pain (pain score 4-6). 1 tablet every 3-4 hours as needed for pain. 07/14/22   [provider]  ipratropium-albuterol  (DUONEB) 0.5-2.5 (3) MG/3ML SOLN Take 3 mLs by nebulization in the morning, at noon, in the evening, and at bedtime. Patient taking differently: Take 3 mLs by nebulization every 6 (six) hours as needed (For shortness of breath). 10/16/23 01/14/24  Unk Garb, DO  metoprolol  tartrate (LOPRESSOR ) 25 MG tablet Take 1 tablet (25 mg total) by mouth 2 (two) times daily. 11/20/23 02/18/24  Diamond Formica, MD  mirtazapine  (REMERON ) 7.5  MG tablet Take 7.5 mg by mouth at bedtime.    [provider]  Multiple Vitamins-Minerals (CENTRUM ADULT PO) Take 1 tablet by mouth daily.    [provider]  pantoprazole  (PROTONIX ) 20 MG tablet Take 1 tablet (20 mg total) by mouth daily. 07/08/22   Gonfa, Taye T, MD  tamsulosin  (FLOMAX ) 0.4 MG CAPS capsule Take 1 capsule (0.4 mg total) by mouth in the morning and at bedtime. 03/29/24   Marco Severs, MD     Critical care time: 60 minutes     CRITICAL CARE Performed by: Kimorah Ridolfi   Total critical care time: 60 minutes  Critical care time was exclusive of separately billable procedures and treating other patients.  Critical care was necessary to treat or prevent imminent or life-threatening deterioration.  Critical care was time spent personally by me on the following activities: development of treatment plan with patient and/or surrogate as well as nursing, discussions with consultants, evaluation of patient's response to treatment, examination of patient, obtaining history from patient or surrogate, ordering and performing treatments and interventions, ordering and review of laboratory studies, ordering and review of radiographic studies, pulse oximetry and re-evaluation of patient's condition.    Patt Boozer Gleason, PA-C [facility] See Amion for pager If no response to pager , please call [phone] until 7pm After 7:00 pm call Elink  [phone]

## 2024-05-07 ENCOUNTER — Inpatient Hospital Stay (HOSPITAL_COMMUNITY)

## 2024-05-07 DIAGNOSIS — I6782 Cerebral ischemia: Secondary | ICD-10-CM

## 2024-05-07 DIAGNOSIS — R570 Cardiogenic shock: Secondary | ICD-10-CM

## 2024-05-07 DIAGNOSIS — R569 Unspecified convulsions: Secondary | ICD-10-CM | POA: Diagnosis not present

## 2024-05-07 DIAGNOSIS — G931 Anoxic brain damage, not elsewhere classified: Secondary | ICD-10-CM

## 2024-05-07 LAB — COMPREHENSIVE METABOLIC PANEL WITH GFR
ALT: 64 U/L — ABNORMAL HIGH (ref 0–44)
ALT: 59 U/L — ABNORMAL HIGH (ref 0–44)
AST: 35 U/L (ref 15–41)
AST: 31 U/L (ref 15–41)
Albumin: 2.5 g/dL — ABNORMAL LOW (ref 3.5–5.0)
Albumin: 2.4 g/dL — ABNORMAL LOW (ref 3.5–5.0)
Alkaline Phosphatase: 56 U/L (ref 38–126)
Alkaline Phosphatase: 57 U/L (ref 38–126)
Anion gap: 12 (ref 5–15)
Anion gap: 9 (ref 5–15)
BUN: 16 mg/dL (ref 8–23)
BUN: 17 mg/dL (ref 8–23)
CO2: 36 mmol/L — ABNORMAL HIGH (ref 22–32)
CO2: 35 mmol/L — ABNORMAL HIGH (ref 22–32)
Calcium: 8.1 mg/dL — ABNORMAL LOW (ref 8.9–10.3)
Calcium: 8.1 mg/dL — ABNORMAL LOW (ref 8.9–10.3)
Chloride: 94 mmol/L — ABNORMAL LOW (ref 98–111)
Chloride: 96 mmol/L — ABNORMAL LOW (ref 98–111)
Creatinine, Ser: 0.66 mg/dL (ref 0.44–1.00)
Creatinine, Ser: 0.73 mg/dL (ref 0.44–1.00)
GFR, Estimated: >60 mL/min (ref >60)
GFR, Estimated: >60 mL/min (ref >60)
Glucose, Bld: 121 mg/dL — ABNORMAL HIGH (ref 70–99)
Glucose, Bld: 117 mg/dL — ABNORMAL HIGH (ref 70–99)
Potassium: 3.2 mmol/L — ABNORMAL LOW (ref 3.5–5.1)
Potassium: 3.2 mmol/L — ABNORMAL LOW (ref 3.5–5.1)
Sodium: 142 mmol/L (ref 135–145)
Sodium: 140 mmol/L (ref 135–145)
Total Bilirubin: 0.4 mg/dL (ref 0.0–1.2)
Total Bilirubin: 0.6 mg/dL (ref 0.0–1.2)
Total Protein: 5.4 g/dL — ABNORMAL LOW (ref 6.5–8.1)
Total Protein: 5.4 g/dL — ABNORMAL LOW (ref 6.5–8.1)

## 2024-05-07 LAB — URINALYSIS, W/ REFLEX TO CULTURE (INFECTION SUSPECTED)
Bilirubin Urine: NEGATIVE
Glucose, UA: NEGATIVE mg/dL
Hgb urine dipstick: NEGATIVE
Ketones, ur: NEGATIVE mg/dL
Leukocytes,Ua: NEGATIVE
Nitrite: NEGATIVE
Protein, ur: NEGATIVE mg/dL
Specific Gravity, Urine: 1.016 (ref 1.005–1.030)
pH: 5 (ref 5.0–8.0)

## 2024-05-07 LAB — CBC
HCT: 33.3 % — ABNORMAL LOW (ref 36.0–46.0)
Hemoglobin: 9.8 g/dL — ABNORMAL LOW (ref 12.0–15.0)
MCH: 28.8 pg (ref 26.0–34.0)
MCHC: 29.4 g/dL — ABNORMAL LOW (ref 30.0–36.0)
MCV: 97.9 fL (ref 80.0–100.0)
Platelets: 133 10*3/uL — ABNORMAL LOW (ref 150–400)
RBC: 3.4 MIL/uL — ABNORMAL LOW (ref 3.87–5.11)
RDW: 14.3 % (ref 11.5–15.5)
WBC: 16.8 10*3/uL — ABNORMAL HIGH (ref 4.0–10.5)
nRBC: 0 % (ref 0.0–0.2)

## 2024-05-07 LAB — GLUCOSE, CAPILLARY
Glucose-Capillary: 112 mg/dL — ABNORMAL HIGH (ref 70–99)
Glucose-Capillary: 119 mg/dL — ABNORMAL HIGH (ref 70–99)
Glucose-Capillary: 125 mg/dL — ABNORMAL HIGH (ref 70–99)
Glucose-Capillary: 125 mg/dL — ABNORMAL HIGH (ref 70–99)
Glucose-Capillary: 126 mg/dL — ABNORMAL HIGH (ref 70–99)
Glucose-Capillary: 141 mg/dL — ABNORMAL HIGH (ref 70–99)

## 2024-05-07 LAB — POCT I-STAT 7, (LYTES, BLD GAS, ICA,H+H)
Acid-Base Excess: 10 mmol/L — ABNORMAL HIGH (ref 0.0–2.0)
Bicarbonate: 37.8 mmol/L — ABNORMAL HIGH (ref 20.0–28.0)
Calcium, Ion: 1.12 mmol/L — ABNORMAL LOW (ref 1.15–1.40)
HCT: 30 % — ABNORMAL LOW (ref 36.0–46.0)
Hemoglobin: 10.2 g/dL — ABNORMAL LOW (ref 12.0–15.0)
O2 Saturation: 96 %
Potassium: 3 mmol/L — ABNORMAL LOW (ref 3.5–5.1)
Sodium: 138 mmol/L (ref 135–145)
TCO2: 40 mmol/L — ABNORMAL HIGH (ref 22–32)
pCO2 arterial: 66.4 mmHg (ref 32–48)
pH, Arterial: 7.364 (ref 7.35–7.45)
pO2, Arterial: 86 mmHg (ref 83–108)

## 2024-05-07 LAB — RESP PANEL BY RT-PCR (RSV, FLU A&B, COVID)  RVPGX2
Influenza A by PCR: NEGATIVE
Influenza B by PCR: NEGATIVE
Resp Syncytial Virus by PCR: NEGATIVE
SARS Coronavirus 2 by RT PCR: NEGATIVE

## 2024-05-07 LAB — BLOOD GAS, ARTERIAL
Acid-Base Excess: 13.8 mmol/L — ABNORMAL HIGH (ref 0.0–2.0)
Bicarbonate: 40.8 mmol/L — ABNORMAL HIGH (ref 20.0–28.0)
Drawn by: 35285
O2 Saturation: 97.7 %
Patient temperature: 36.3
pCO2 arterial: 58 mmHg — ABNORMAL HIGH (ref 32–48)
pH, Arterial: 7.45 (ref 7.35–7.45)
pO2, Arterial: 84 mmHg (ref 83–108)

## 2024-05-07 LAB — FIBRINOGEN
Fibrinogen: 569 mg/dL — ABNORMAL HIGH (ref 210–475)
Fibrinogen: 548 mg/dL — ABNORMAL HIGH (ref 210–475)

## 2024-05-07 LAB — PROTIME-INR
INR: 1.1 (ref 0.8–1.2)
INR: 1.1 (ref 0.8–1.2)
Prothrombin Time: 14 s (ref 11.4–15.2)
Prothrombin Time: 14.2 s (ref 11.4–15.2)

## 2024-05-07 LAB — APTT
aPTT: 30 s (ref 24–36)
aPTT: 31 s (ref 24–36)

## 2024-05-07 LAB — PHOSPHORUS
Phosphorus: 3.5 mg/dL (ref 2.5–4.6)
Phosphorus: 3.2 mg/dL (ref 2.5–4.6)

## 2024-05-07 LAB — BILIRUBIN, DIRECT
Bilirubin, Direct: <0.1 mg/dL (ref 0.0–0.2)
Bilirubin, Direct: <0.1 mg/dL (ref 0.0–0.2)

## 2024-05-07 LAB — MAGNESIUM
Magnesium: 1.7 mg/dL (ref 1.7–2.4)
Magnesium: 1.7 mg/dL (ref 1.7–2.4)

## 2024-05-07 MED ORDER — ACETAMINOPHEN 325 MG PO TABS: 650.0000 mg | ORAL_TABLET | Freq: Four times a day (QID) | ORAL | Status: DC | PRN

## 2024-05-07 MED ORDER — HALOPERIDOL LACTATE 5 MG/ML IJ SOLN
0.5000 mg | INTRAMUSCULAR | Status: DC | PRN
  Administered 2024-05-09: 0.5 mg via INTRAVENOUS
  Filled 2024-05-07: qty 1

## 2024-05-07 MED ORDER — POLYVINYL ALCOHOL 1.4 % OP SOLN: 1.0000 [drp] | Freq: Four times a day (QID) | OPHTHALMIC | Status: DC | PRN

## 2024-05-07 MED ORDER — GLYCOPYRROLATE 0.2 MG/ML IJ SOLN
0.2000 mg | INTRAMUSCULAR | Status: DC | PRN
  Administered 2024-05-09: 0.2 mg via INTRAVENOUS
  Filled 2024-05-07: qty 1

## 2024-05-07 MED ORDER — ACETAMINOPHEN 650 MG RE SUPP: 650.0000 mg | Freq: Four times a day (QID) | RECTAL | Status: DC | PRN

## 2024-05-07 MED ORDER — ONDANSETRON HCL 4 MG/2ML IJ SOLN: 4.0000 mg | Freq: Four times a day (QID) | INTRAMUSCULAR | Status: DC | PRN

## 2024-05-07 MED ORDER — BIOTENE DRY MOUTH MT LIQD: 15.0000 mL | OROMUCOSAL | Status: DC | PRN

## 2024-05-07 MED ORDER — ONDANSETRON 4 MG PO TBDP: 4.0000 mg | ORAL_TABLET | Freq: Four times a day (QID) | ORAL | Status: DC | PRN

## 2024-05-07 MED ORDER — LABETALOL HCL 5 MG/ML IV SOLN
10.0000 mg | INTRAVENOUS | Status: AC | PRN
  Administered 2024-05-07 (×3): 10 mg via INTRAVENOUS
  Filled 2024-05-07 (×2): qty 4

## 2024-05-07 MED ORDER — HALOPERIDOL LACTATE 2 MG/ML PO CONC: 0.5000 mg | ORAL | Status: DC | PRN

## 2024-05-07 MED ORDER — HALOPERIDOL 0.5 MG PO TABS: 0.5000 mg | ORAL_TABLET | ORAL | Status: DC | PRN

## 2024-05-07 MED ORDER — MORPHINE SULFATE (CONCENTRATE) 10 MG /0.5 ML PO SOLN: 5.0000 mg | ORAL | Status: DC | PRN

## 2024-05-07 MED ORDER — GLYCOPYRROLATE 0.2 MG/ML IJ SOLN: 0.2000 mg | INTRAMUSCULAR | Status: DC | PRN

## 2024-05-07 MED ORDER — GLYCOPYRROLATE 1 MG PO TABS: 1.0000 mg | ORAL_TABLET | ORAL | Status: DC | PRN

## 2024-05-07 NOTE — Progress Notes (Signed)
 NAME:  [name], MRN:  [id number], DOB:  1951/09/25, LOS: 1 ADMISSION DATE:  05/05/2024, CONSULTATION DATE:  05/18/2024 REFERRING MD:  EDP, CHIEF COMPLAINT:  cardiac arrest   History of Present Illness:  73 y.o. F with PMH significant for COPD on 2L home O2, CHF, HTN, chronic pain who was found unresponsive in bed with her CPAP on by a family member per report. Unknown down time, but CPR started by EMS with ROSC after three rounds of ACLS.  Pt was unresponsive post-rosc and had severe seizure-like/myoclonic jerking in the ED.  CTH with  symmetric hypodensities involving the caudate nuclei bilaterally and CXR with R basilar disease. Labs were significant for creainine 1.26, lactic acid 13.8  Pertinent  Medical History   has a past medical history of Acute respiratory failure with hypoxia and hypercapnia (HCC) (07/19/2023), Anxiety, Atrophic vaginitis (10/09/2015), Baker's cyst, Blood in urine (10/09/2015), CHF (congestive heart failure) (HCC), COPD (chronic obstructive pulmonary disease) (HCC), Heart murmur, Hematuria (10/09/2015), Hypertension, Nicotine  addiction (04/07/2014), Oxygen  dependent, Pneumonia, Urinary retention (07/01/2020), Urinary urgency (01/25/2016), and Vaginal bleeding (10/09/2015).   Significant Hospital Events: Including procedures, antibiotic start and stop dates in addition to other pertinent events   5/12 admit to ICU post-arrest  Interim History / Subjective:  Seen this morning, remains intubated and sedated  Objective    Blood pressure (!) 119/58, pulse 66, temperature (!) 97.3 F (36.3 C), resp. rate (!) 22, height 5\' 3"  (1.6 m), weight 88.3 kg, SpO2 95%.    Vent Mode: PRVC FiO2 (%):  [40 %-50 %] 40 % Set Rate:  [22 bmp] 22 bmp Vt Set:  [420 mL] 420 mL PEEP:  [5 cmH20] 5 cmH20 Plateau Pressure:  [22 cmH20-29 cmH20] 22 cmH20   Intake/Output Summary (Last 24 hours) at 05/06/2024 4098 Last data filed at 05/10/2024 0600 Gross per 24 hour  Intake 3080.51 ml   Output 1000 ml  Net 2080.51 ml   Filed Weights   05/06/24 0125 05/06/24 0300 05/25/2024 0500  Weight: 84.5 kg 82.8 kg 88.3 kg    General:  critically ill-appearing F intubated and sedated HEENT: MM pink/moist, ETT in place Neuro: Not responsive to painful or vocal stimuli, pupils are blown CV: s1s2 rrr, no m/r/g PULM:  scattered rhonchi bilaterally  GI: soft, bsx4 active  Extremities: warm/dry, no edema     Resolved Hospital Problem list     Assessment & Plan:   Cerebellar Herniation  Unwitnessed out of hospital cardiac arrest  Acute on chronic hypoxic respiratory failure in setting of baseline COPD Shock, likely distributive vs cardiogenic  -Repeat CT head shows worsening of brain edema, with downward herniation of cerebellum - Exam is a reflection of this with blown pupils and unresponsiveness  - Discussed with sister yesterday, who will try to come by the hospital today with more family. She has a very poor prognosis, and the only thing keeping her alive right now is mechanical ventilation and pressor therapy. As of right now, she is still full scope of care.  - I have attempted to call all numbers listed in the chart, with no response this AM. Will try again.    AKI, mildly elevated LFT's -likely secondary to shock -IVF, supportive care and trend labs -I & O and avoid nephrotoxins     Best Practice (right click and "Reselect all SmartList Selections" daily)   Diet/type: NPO DVT prophylaxis LMWH Pressure ulcer(s): N/A GI prophylaxis: H2B Lines: N/A Foley:  Yes, and it is still needed  Code Status:  full code Last date of multidisciplinary goals of care discussion [Pt's sister updated 5/12]  Labs   CBC: Recent Labs  Lab 05/06/24 0004 05/06/24 0006 05/06/24 0422  WBC  --  9.3  --   NEUTROABS  --  5.6  --   HGB 12.9  12.2 10.6* 11.2*  HCT 38.0  36.0 39.1 33.0*  MCV  --  108.9*  --   PLT  --  154  --     Basic Metabolic Panel: Recent Labs  Lab  05/06/24 0004 05/06/24 0006 05/06/24 0401 05/06/24 0422  NA 137  136 140 140 138  K 4.0  4.0 4.1 3.8 3.6  CL 87* 88* 86*  --   CO2  --  25 38*  --   GLUCOSE 208* 216* 225*  --   BUN 23 17 21   --   CREATININE 1.10* 1.26* 1.02*  --   CALCIUM   --  8.0* 7.8*  --   MG  --  2.5* 2.0  --   PHOS  --   --  5.5*  --    GFR: Estimated Creatinine Clearance: 54.1 mL/min (A) (by C-G formula based on SCr of 1.02 mg/dL (H)). Recent Labs  Lab 05/06/24 0005 05/06/24 0006  WBC  --  9.3  LATICACIDVEN 13.8*  --     Liver Function Tests: Recent Labs  Lab 05/06/24 0006  AST 132*  ALT 94*  ALKPHOS 82  BILITOT 0.7  PROT 6.1*  ALBUMIN 3.2*   Recent Labs  Lab 05/06/24 0006  LIPASE 28   No results for input(s): "AMMONIA" in the last 168 hours.  ABG    Component Value Date/Time   PHART 7.386 05/06/2024 0422   PCO2ART 74.0 (HH) 05/06/2024 0422   PO2ART 105 05/06/2024 0422   HCO3 44.7 (H) 05/06/2024 0422   TCO2 47 (H) 05/06/2024 0422   O2SAT 98 05/06/2024 0422     Coagulation Profile: No results for input(s): "INR", "PROTIME" in the last 168 hours.  Cardiac Enzymes: No results for input(s): "CKTOTAL", "CKMB", "CKMBINDEX", "TROPONINI" in the last 168 hours.  HbA1C: Hgb A1c MFr Bld  Date/Time Value Ref Range Status  11/12/2023 04:36 PM 5.0 4.8 - 5.6 % Final    Comment:    (NOTE) Pre diabetes:          5.7%-6.4%  Diabetes:              >6.4%  Glycemic control for   <7.0% adults with diabetes   07/01/2022 02:07 AM 5.4 4.8 - 5.6 % Final    Comment:    (NOTE) Pre diabetes:          5.7%-6.4%  Diabetes:              >6.4%  Glycemic control for   <7.0% adults with diabetes     CBG: Recent Labs  Lab 05/06/24 1119 05/06/24 1517 05/06/24 1947 05/06/24 2348 05/20/2024 0339  GLUCAP 125* 133* 123* 151* 125*    Review of Systems:   Unable to obtain   Past Medical History:  She,  has a past medical history of Acute respiratory failure with hypoxia and hypercapnia  (HCC) (07/19/2023), Anxiety, Atrophic vaginitis (10/09/2015), Baker's cyst, Blood in urine (10/09/2015), CHF (congestive heart failure) (HCC), COPD (chronic obstructive pulmonary disease) (HCC), Heart murmur, Hematuria (10/09/2015), Hypertension, Nicotine  addiction (04/07/2014), Oxygen  dependent, Pneumonia, Urinary retention (07/01/2020), Urinary urgency (01/25/2016), and Vaginal bleeding (10/09/2015).   Surgical History:   Past Surgical History:  Procedure Laterality Date   ABDOMINAL HYSTERECTOMY     CATARACT EXTRACTION Left 03/2015   CESAREAN SECTION     COLONOSCOPY  10/19/2004   Dr. Riley Cheadle- internal hemorrhoids, o/w normal rectum, normal colon   COLONOSCOPY N/A 07/30/2014   NWG:NFAOZHY diverticulosis. Colonic polyp-removed TA. repeat TCS 07/2021   COLONOSCOPY WITH PROPOFOL  N/A 03/29/2021   diverticulosis in sigmoid and descending colon, one 5 mm polyp ascending colon (adenoma), non-bleeding internal hemorrhoids. 5 year surveillance   ESOPHAGOGASTRODUODENOSCOPY N/A 07/30/2014   QMV:HQIONG EGD   POLYPECTOMY  03/29/2021   Procedure: POLYPECTOMY;  Surgeon: Suzette Espy, MD;  Location: [facility];  Service: Endoscopy;;   RADIAL HEAD ARTHROPLASTY Left 06/23/2017   Procedure: RADIAL HEAD ARTHROPLASTY WITH LIGAMENT REPAIR;  Surgeon: Jasmine Mesi, MD;  Location: [facility] OR;  Service: Orthopedics;  Laterality: Left;     Social History:   reports that she quit smoking about 11 years ago. Her smoking use included cigarettes. She started smoking about 51 years ago. She has been exposed to tobacco smoke. She has never used smokeless tobacco. She reports current alcohol use. She reports that she does not use drugs.   Family History:  Her family history includes Blindness in her maternal grandfather; Cancer in her daughter, sister, sister, sister, and sister; Hypertension in her sister; Migraines in her daughter; Other in her brother, brother, sister, and sister. There is no history of Colon cancer. She  was adopted.   Allergies Allergies  Allergen Reactions   Neurontin  [Gabapentin ] Other (See Comments)    Lightheadedness  Groggy      Home Medications  Prior to Admission medications   Medication Sig Start Date End Date Taking? Authorizing Provider  albuterol  (VENTOLIN  HFA) 108 (90 Base) MCG/ACT inhaler Inhale 2 puffs into the lungs every 4 (four) hours as needed for wheezing or shortness of breath. For shortness of breath 08/14/23   Diamond Formica, MD  ALPRAZolam  (XANAX ) 0.25 MG tablet Take 0.25 mg by mouth 2 (two) times daily as needed. 12/14/23   [provider]  aspirin  EC 81 MG tablet Take 81 mg by mouth daily.    [provider]  atorvastatin  (LIPITOR ) 80 MG tablet Take 1 tablet (80 mg total) by mouth daily. 07/07/22   Gonfa, Taye T, MD  bethanechol  (URECHOLINE ) 10 MG tablet Take 2 tablets (20 mg total) by mouth daily. 11/10/23   McKenzie, Arden Beck, MD  budesonide  (PULMICORT ) 0.25 MG/2ML nebulizer solution Take 2 mLs (0.25 mg total) by nebulization 2 (two) times daily. Patient taking differently: Take 0.25 mg by nebulization 2 (two) times daily as needed (for shortness of breath). 10/16/23 01/14/24  Unk Garb, DO  Fluticasone -Umeclidin-Vilant (TRELEGY ELLIPTA ) 100-62.5-25 MCG/ACT AEPB Inhale 1 puff into the lungs daily. 02/27/24   Diamond Formica, MD  furosemide  (LASIX ) 20 MG tablet Take 1 tablet (20 mg total) by mouth daily. 11/17/23   Haydee Lipa, MD  HYDROcodone -acetaminophen  (NORCO) 10-325 MG tablet Take 1 tablet by mouth every 6 (six) hours as needed for moderate pain (pain score 4-6). 1 tablet every 3-4 hours as needed for pain. 07/14/22   [provider]  ipratropium-albuterol  (DUONEB) 0.5-2.5 (3) MG/3ML SOLN Take 3 mLs by nebulization in the morning, at noon, in the evening, and at bedtime. Patient taking differently: Take 3 mLs by nebulization every 6 (six) hours as needed (For shortness of breath). 10/16/23 01/14/24  Unk Garb, DO  metoprolol   tartrate (LOPRESSOR ) 25 MG tablet Take 1 tablet (25 mg total)  by mouth 2 (two) times daily. 11/20/23 02/18/24  Diamond Formica, MD  mirtazapine  (REMERON ) 7.5 MG tablet Take 7.5 mg by mouth at bedtime.    [provider]  Multiple Vitamins-Minerals (CENTRUM ADULT PO) Take 1 tablet by mouth daily.    [provider]  pantoprazole  (PROTONIX ) 20 MG tablet Take 1 tablet (20 mg total) by mouth daily. 07/08/22   Gonfa, Taye T, MD  tamsulosin  (FLOMAX ) 0.4 MG CAPS capsule Take 1 capsule (0.4 mg total) by mouth in the morning and at bedtime. 03/29/24   Marco Severs, MD     Critical care time: 60 minutes     CRITICAL CARE Performed by: Anthon Harpole   Total critical care time: 60 minutes  Critical care time was exclusive of separately billable procedures and treating other patients.  Critical care was necessary to treat or prevent imminent or life-threatening deterioration.  Critical care was time spent personally by me on the following activities: development of treatment plan with patient and/or surrogate as well as nursing, discussions with consultants, evaluation of patient's response to treatment, examination of patient, obtaining history from patient or surrogate, ordering and performing treatments and interventions, ordering and review of laboratory studies, ordering and review of radiographic studies, pulse oximetry and re-evaluation of patient's condition.    Mashawn Brazil, MD [facility] Internal Medicine Resident, PGY-2 See Amion for pager If no response to pager , please call [phone] until 7pm After 7:00 pm call Elink  [phone]

## 2024-05-07 NOTE — Progress Notes (Signed)
 LTM VIDEO EEG discontinued - no skin breakdown at [facility]

## 2024-05-07 NOTE — Progress Notes (Signed)
 eLink Physician-Brief Progress Note Patient Name: [name] DOB: 1951/01/01 MRN: [id number]   Date of Service  05/02/2024  HPI/Events of Note  Notified of hypertension with SBP in the 200s, HR 80s, despite being off levophed  over the past 2 hours.    Pt is intubated, sedated on propofol , fentanyl  and versed . Pt with anoxic brain injury, cerebral edema, cerebellar herniation.   eICU Interventions  Give labetalol  IV PRN.      Intervention Category Intermediate Interventions: Hypertension - evaluation and management  Lanell Pinta 05/08/2024, 4:39 AM

## 2024-05-07 NOTE — Progress Notes (Addendum)
 NEUROLOGY CONSULT FOLLOW UP NOTE   Date of service: May 07, 2024 Patient Name: [name] MRN:  [id number] DOB:  09-21-1951  Interval Hx/subjective   No family at the bedside. Hypertensive overnight, pupils blown this morning.  Bedside review of EEG-reveals flat EEG, propofol  still infusing  Current AEDs:  Depacon 500 mg q8 Keppra 1000 mg q12  Current Sedation: Versed - 1mg /hr Propofol  - 50mcg/kg/min   Vitals   Vitals:   04/28/2024 0600 05/08/2024 0615 05/25/2024 0630 05/02/2024 0645  BP:      Pulse: 67 67 66 66  Resp: (!) 22 (!) 22 (!) 22 (!) 22  Temp: (!) 97.5 F (36.4 C) (!) 97.5 F (36.4 C) (!) 97.5 F (36.4 C) (!) 97.3 F (36.3 C)  TempSrc:      SpO2: 94% 95% 95% 95%  Weight:      Height:         Body mass index is 34.48 kg/m.  Physical Exam   Constitutional: Appears well-developed and well-nourished.  Psych: Unresponsive Eyes: No scleral injection.  HENT: ET tube in place Head: Normocephalic.  Cardiovascular: Normal rate and regular rhythm.  Requiring vasopressor support Respiratory: Mechanically ventilated GI: Soft.  No distension.  OG in place Skin: Intact on clothed exam  Neurologic Examination   Neuro: Mental Status: Patient is sedated, intubated.  Not following commands Cranial Nerves: II: Pupils are blown bilaterally III,IV, VI: Eyes are midline VII: obstructed by ETT VIII: No response to verbal stimuli X: No cough or gag, sedation is infusing XI: Head is midline XII: Does not follow commands Motor/sensory: Flaccid, no movement to noxious stimuli  Medications  Current Facility-Administered Medications:    ceFEPIme  (MAXIPIME ) 2 g in sodium chloride  0.9 % 100 mL IVPB, 2 g, Intravenous, Q12H, Bryk, Veronda P, RPH, Last Rate: 200 mL/hr at 05/23/2024 0629, 2 g at 04/27/2024 4098   Chlorhexidine  Gluconate Cloth 2 % PADS 6 each, 6 each, Topical, Daily, Lezlie Reddish, DO, 6 each at 05/06/24 0300   docusate (COLACE) 50 MG/5ML liquid 100 mg, 100  mg, Per Tube, BID, Long, Joshua G, MD, 100 mg at 05/06/24 2125   docusate sodium  (COLACE) capsule 100 mg, 100 mg, Oral, BID PRN, Gleason, Patt Boozer, PA-C   EPINEPHrine  (ADRENALIN ) 5 mg in NS 250 mL (0.02 mg/mL) premix infusion, 0.5-20 mcg/min, Intravenous, Titrated, Long, Joshua G, MD, Stopped at 05/06/24 0328   famotidine  (PEPCID ) tablet 20 mg, 20 mg, Per Tube, BID, Gleason, Patt Boozer, PA-C, 20 mg at 05/06/24 2125   fentaNYL  (SUBLIMAZE ) bolus via infusion 25-100 mcg, 25-100 mcg, Intravenous, Q15 min PRN, Long, Joshua G, MD, 100 mcg at 05/14/2024 0438   fentaNYL  in NS (70mcg/ml) infusion-PREMIX, 25-200 mcg/hr, Intravenous, Continuous, Long, Shereen Dike, MD, Last Rate: 10 mL/hr at 05/15/2024 0600, 100 mcg/hr at 05/12/2024 0600   heparin  injection 5,000 Units, 5,000 Units, Subcutaneous, Q8H, Gleason, Patt Boozer, PA-C, 5,000 Units at 05/17/2024 0525   insulin  aspart (novoLOG ) injection 0-9 Units, 0-9 Units, Subcutaneous, Q4H, Gleason, Patt Boozer, PA-C, 1 Units at 05/20/2024 0440   levETIRAcetam (KEPPRA) undiluted injection 1,000 mg, 1,000 mg, Intravenous, Q12H, Augustin Leber, MD, 1,000 mg at 05/16/2024 0018   midazolam  (VERSED ) 100 mg/100 mL (1 mg/mL) premix infusion, 0.5-10 mg/hr, Intravenous, Continuous, Gleason, Patt Boozer, PA-C, Last Rate: 1 mL/hr at 05/13/2024 0600, 1 mg/hr at 05/18/2024 0600   midazolam  (VERSED ) injection 1-2 mg, 1-2 mg, Intravenous, Q1H PRN, Long, Joshua G, MD, 2 mg at 05/06/24 0056   norepinephrine  (LEVOPHED )  4mg  in (0.016 mg/mL) premix infusion, 0-40 mcg/min, Intravenous, Titrated, Long, Shereen Dike, MD, Stopped at 05/19/2024 0245   Oral care mouth rinse, 15 mL, Mouth Rinse, Q2H, Lezlie Reddish, DO, 15 mL at 05/08/2024 0630   Oral care mouth rinse, 15 mL, Mouth Rinse, PRN, Lezlie Reddish, DO   polyethylene glycol (MIRALAX  / GLYCOLAX ) packet 17 g, 17 g, Per Tube, Daily, Long, Shereen Dike, MD, 17 g at 05/06/24 1120   polyethylene glycol (MIRALAX  / GLYCOLAX ) packet 17 g, 17 g, Oral, Daily  PRN, Gleason, Patt Boozer, PA-C   propofol  (DIPRIVAN ) 1000 MG/100ML infusion, 5-80 mcg/kg/min, Intravenous, Continuous, Long, Shereen Dike, MD, Last Rate: 25.4 mL/hr at 04/25/2024 0614, 50 mcg/kg/min at 05/22/2024 6045   sodium chloride  0.9 % bolus 500 mL, 500 mL, Intravenous, Once, Long, Shereen Dike, MD   valproate (DEPACON) 500 mg in dextrose  5 % 50 mL IVPB, 500 mg, Intravenous, Q8H, Augustin Leber, MD, Last Rate: 55 mL/hr at 05/22/2024 0600, Infusion Verify at 05/11/2024 0600   vancomycin  (VANCOREADY) IVPB 750 mg/150 mL, 750 mg, Intravenous, Q24H, Bryk, Veronda P, RPH, Last Rate: 150 mL/hr at 05/01/2024 0600, Infusion Verify at 05/01/2024 0600   vasopressin (PITRESSIN) 20 Units in 100 mL (0.2 unit/mL) infusion-*FOR SHOCK*, 0-0.03 Units/min, Intravenous, Continuous, Gleason, Patt Boozer, PA-C  Labs and Diagnostic Imaging   CBC:  Recent Labs  Lab 05/06/24 0006 05/06/24 0422  WBC 9.3  --   NEUTROABS 5.6  --   HGB 10.6* 11.2*  HCT 39.1 33.0*  MCV 108.9*  --   PLT 154  --     Basic Metabolic Panel:  Lab Results  Component Value Date   NA 138 05/06/2024   K 3.6 05/06/2024   CO2 38 (H) 05/06/2024   GLUCOSE 225 (H) 05/06/2024   BUN 21 05/06/2024   CREATININE 1.02 (H) 05/06/2024   CALCIUM  7.8 (L) 05/06/2024   GFRNONAA 59 (L) 05/06/2024   GFRAA >60 06/24/2020   Lipid Panel:  Lab Results  Component Value Date   LDLCALC 84 06/30/2022   HgbA1c: Lab Results  Component Value Date   HGBA1C 5.0 11/12/2023   Urine Drug Screen:     Component Value Date/Time   LABOPIA POSITIVE (A) 05/06/2024 0211   COCAINSCRNUR NONE DETECTED 05/06/2024 0211   LABBENZ POSITIVE (A) 05/06/2024 0211   AMPHETMU NONE DETECTED 05/06/2024 0211   THCU NONE DETECTED 05/06/2024 0211   LABBARB NONE DETECTED 05/06/2024 0211   Lactic Acid, Venous    Component Value Date/Time   LATICACIDVEN 13.8 (HH) 05/06/2024 0005   INR  Lab Results  Component Value Date   INR 1.2 06/29/2022   APTT  Lab Results  Component Value  Date   APTT 29 06/29/2022   CT Head without contrast(Personally reviewed): Suspect there are some early signs of basal ganglia insult   Repeat CT Head 1. Marked brain edema and global mass effect has progressed. 2. Downward herniation of the cerebellum into the foramen magnum. 3. Developing hydrocephalus of the lateral and third ventricles with moderate prominence of the temporal tips. 4. Increased prominence of the tentorium and cortex gives the appearance of hemorrhage, so-called pseudo subarachnoid hemorrhage. 5. Hypoxic ischemic injury of the caudate head bilaterally is again noted.  Ceribell: 05/06/2024 0317 to 0841  This limited ceribell EEG was suggestive of severe to profound diffuse encephalopathy.  No video is available to see if patient had any jerking.  Therefore clinical correlation is recommended.  As sedation was adjusted, after around 4 AM  EEG was suggestive of profound diffuse encephalopathy.   rEEG:  05/06/2024  This study was suggestive of severe to profound diffuse encephalopathy.  No definite seizures were noted.   cEEG:    Assessment   [name] is a 73 y.o. female with a PMH of COPD on 2L home O2, CHF, HTN, chronic pain who was found unresponsive in bed with her CPAP on by a family member.  Downtime is therefore unclear but received at least 15 minutes of CPR by EMS. Vomited in route to the hospital and intubated by EMS. After intubation she had early myoclonic movements.  CT head on arrival already showing concern for anoxic injury.  She received 4.5 g of Keppra in the emergency department and is now on 1000 mg of Keppra every 12 hours.  She received 3 g of Depakote in the ED and is now on 500 g every 8 hours. Repeat CT head reveals marked brain edema and global mass effect that has progressed from the earlier CT head.  Now there is also evidence of downward herniation of the cerebellum into the foramen magnum with developing hydrocephalus of the lateral and third  ventricles with moderate prominence of the temporal tips.  Patient had an episode of hypertension overnight with her systolic blood pressure in the 200s.  This and her blown pupils on exam this morning indicating that she has likely herniated.  This is a non survivable injury. Family has stated that the are coming to the bedside in conversation with the critical care team, they are aware of her prognosis, but have not yet made a decision regarding goals of care.   Current AEDs:  Depacon 500 mg q8 Keppra 1000 mg q12  Current Sedation: Versed  infusion Propofol  infusion  Current Abx: Cefepime  Vancomycin   Impression: Severe anoxic brain injury, will progress to brain death if not already progressed  Recommendations  - Discontinue LTM - Continue keppra 1000mg  q12 - Continue depacon 500mg  q8 - Neurology remains available to assist in goals of care conversation  ______________________________________________________________________   Signed, Imogene Mana, NP [facility]  Attending Neurohospitalist Addendum Patient seen and examined with APP/Resident. Agree with the history and physical as documented above. Agree with the plan as documented, which I helped formulate. I have independently reviewed the chart, obtained history, review of systems and examined the patient.I have personally reviewed pertinent head/neck/spine imaging (CT/MRI). Please feel free to call with any questions.  -- Tona Francis, MD Neurologist [facility] Pager: [phone]   CRITICAL CARE ATTESTATION Performed by: Tona Francis, MD Total critical care time: 30 minutes Critical care time was exclusive of separately billable procedures and treating other patients and/or supervising APPs/Residents/Students Critical care was necessary to treat or prevent imminent or life-threatening deterioration. This patient is critically ill and at significant risk for neurological worsening and/or  death and care requires constant monitoring. Critical care was time spent personally by me on the following activities: development of treatment plan with patient and/or surrogate as well as nursing, discussions with consultants, evaluation of patient's response to treatment, examination of patient, obtaining history from patient or surrogate, ordering and performing treatments and interventions, ordering and review of laboratory studies, ordering and review of radiographic studies, pulse oximetry, re-evaluation of patient's condition, participation in multidisciplinary rounds and medical decision making of high complexity in the care of this patient.

## 2024-05-07 NOTE — Progress Notes (Signed)
 ABG noted  Rate increased to 24, tidal volume increased to 470

## 2024-05-07 NOTE — Progress Notes (Signed)
 Discussed with patients granddaughter at bedside and spoke with patients sister on the phone  We will compassionately extubate to comfort

## 2024-05-07 NOTE — Procedures (Signed)
 Patient Name: [name]  MRN: [id number]  Epilepsy Attending: Arleene Lack  Referring Physician/Provider: Imogene Mana, NP  Duration: 05/06/2024 2536 to 05/13/2024 6440   Patient history:  73 y.o. female found down in cardiac arrest with early myoclonic movements. EEG to evaluate for seizure   Level of alertness: comatose   AEDs during EEG study:  LEV, VPA, Propofol , Versed     Technical aspects: This EEG study was done with scalp electrodes positioned according to the 10-20 International system of electrode placement. Electrical activity was reviewed with band pass filter of 1-70Hz , sensitivity of 7 uV/mm, display speed of 29mm/sec with a 60Hz  notched filter applied as appropriate. EEG data were recorded continuously and digitally stored.  Video monitoring was available and reviewed as appropriate.   Description: EEG showed burst suppression pattern with bursts of 3-5 hz theta-delta slowing lasting 1-2 seconds alternating with  6-60 seconds of generalized EEG suppression. After around noon on 05/06/2024, EEG showed generalized suppression. Hyperventilation and photic stimulation were not performed.      ABNORMALITY - Burst suppression, generalized   IMPRESSION: This study was suggestive of severe to profound diffuse encephalopathy. After around noon on 05/06/2024, EEG worsened and was suggestive of profound diffuse encephalopathy. No definite seizures were noted.   Fincastle Ducre O Marico Buckle

## 2024-05-08 LAB — COMPREHENSIVE METABOLIC PANEL WITH GFR
ALT: 55 U/L — ABNORMAL HIGH (ref 0–44)
ALT: 47 U/L — ABNORMAL HIGH (ref 0–44)
ALT: 47 U/L — ABNORMAL HIGH (ref 0–44)
ALT: 44 U/L (ref 0–44)
AST: 28 U/L (ref 15–41)
AST: 25 U/L (ref 15–41)
AST: 24 U/L (ref 15–41)
AST: 24 U/L (ref 15–41)
Albumin: 2.5 g/dL — ABNORMAL LOW (ref 3.5–5.0)
Albumin: 2.2 g/dL — ABNORMAL LOW (ref 3.5–5.0)
Albumin: 2.2 g/dL — ABNORMAL LOW (ref 3.5–5.0)
Albumin: 2.1 g/dL — ABNORMAL LOW (ref 3.5–5.0)
Alkaline Phosphatase: 66 U/L (ref 38–126)
Alkaline Phosphatase: 64 U/L (ref 38–126)
Alkaline Phosphatase: 67 U/L (ref 38–126)
Alkaline Phosphatase: 67 U/L (ref 38–126)
Anion gap: 9 (ref 5–15)
Anion gap: 8 (ref 5–15)
Anion gap: 6 (ref 5–15)
Anion gap: 7 (ref 5–15)
BUN: 20 mg/dL (ref 8–23)
BUN: 21 mg/dL (ref 8–23)
BUN: 21 mg/dL (ref 8–23)
BUN: 21 mg/dL (ref 8–23)
CO2: 33 mmol/L — ABNORMAL HIGH (ref 22–32)
CO2: 31 mmol/L (ref 22–32)
CO2: 33 mmol/L — ABNORMAL HIGH (ref 22–32)
CO2: 33 mmol/L — ABNORMAL HIGH (ref 22–32)
Calcium: 8.2 mg/dL — ABNORMAL LOW (ref 8.9–10.3)
Calcium: 8 mg/dL — ABNORMAL LOW (ref 8.9–10.3)
Calcium: 8.3 mg/dL — ABNORMAL LOW (ref 8.9–10.3)
Calcium: 8.4 mg/dL — ABNORMAL LOW (ref 8.9–10.3)
Chloride: 96 mmol/L — ABNORMAL LOW (ref 98–111)
Chloride: 99 mmol/L (ref 98–111)
Chloride: 101 mmol/L (ref 98–111)
Chloride: 102 mmol/L (ref 98–111)
Creatinine, Ser: 0.85 mg/dL (ref 0.44–1.00)
Creatinine, Ser: 0.82 mg/dL (ref 0.44–1.00)
Creatinine, Ser: 0.83 mg/dL (ref 0.44–1.00)
Creatinine, Ser: 0.79 mg/dL (ref 0.44–1.00)
GFR, Estimated: >60 mL/min (ref >60)
GFR, Estimated: >60 mL/min (ref >60)
GFR, Estimated: >60 mL/min (ref >60)
GFR, Estimated: >60 mL/min (ref >60)
Glucose, Bld: 97 mg/dL (ref 70–99)
Glucose, Bld: 98 mg/dL (ref 70–99)
Glucose, Bld: 98 mg/dL (ref 70–99)
Glucose, Bld: 105 mg/dL — ABNORMAL HIGH (ref 70–99)
Potassium: 4 mmol/L (ref 3.5–5.1)
Potassium: 4 mmol/L (ref 3.5–5.1)
Potassium: 4.2 mmol/L (ref 3.5–5.1)
Potassium: 4.1 mmol/L (ref 3.5–5.1)
Sodium: 138 mmol/L (ref 135–145)
Sodium: 138 mmol/L (ref 135–145)
Sodium: 140 mmol/L (ref 135–145)
Sodium: 142 mmol/L (ref 135–145)
Total Bilirubin: 0.6 mg/dL (ref 0.0–1.2)
Total Bilirubin: 0.5 mg/dL (ref 0.0–1.2)
Total Bilirubin: 0.7 mg/dL (ref 0.0–1.2)
Total Bilirubin: 0.7 mg/dL (ref 0.0–1.2)
Total Protein: 5.6 g/dL — ABNORMAL LOW (ref 6.5–8.1)
Total Protein: 5.1 g/dL — ABNORMAL LOW (ref 6.5–8.1)
Total Protein: 5.3 g/dL — ABNORMAL LOW (ref 6.5–8.1)
Total Protein: 5.2 g/dL — ABNORMAL LOW (ref 6.5–8.1)

## 2024-05-08 LAB — POCT I-STAT 7, (LYTES, BLD GAS, ICA,H+H)
Acid-Base Excess: 8 mmol/L — ABNORMAL HIGH (ref 0.0–2.0)
Acid-Base Excess: 6 mmol/L — ABNORMAL HIGH (ref 0.0–2.0)
Acid-Base Excess: 11 mmol/L — ABNORMAL HIGH (ref 0.0–2.0)
Acid-Base Excess: 10 mmol/L — ABNORMAL HIGH (ref 0.0–2.0)
Bicarbonate: 34.4 mmol/L — ABNORMAL HIGH (ref 20.0–28.0)
Bicarbonate: 32.8 mmol/L — ABNORMAL HIGH (ref 20.0–28.0)
Bicarbonate: 36.2 mmol/L — ABNORMAL HIGH (ref 20.0–28.0)
Bicarbonate: 36 mmol/L — ABNORMAL HIGH (ref 20.0–28.0)
Calcium, Ion: 1.1 mmol/L — ABNORMAL LOW (ref 1.15–1.40)
Calcium, Ion: 1.13 mmol/L — ABNORMAL LOW (ref 1.15–1.40)
Calcium, Ion: 1.11 mmol/L — ABNORMAL LOW (ref 1.15–1.40)
Calcium, Ion: 1.09 mmol/L — ABNORMAL LOW (ref 1.15–1.40)
HCT: 30 % — ABNORMAL LOW (ref 36.0–46.0)
HCT: 31 % — ABNORMAL LOW (ref 36.0–46.0)
HCT: 29 % — ABNORMAL LOW (ref 36.0–46.0)
HCT: 28 % — ABNORMAL LOW (ref 36.0–46.0)
Hemoglobin: 10.2 g/dL — ABNORMAL LOW (ref 12.0–15.0)
Hemoglobin: 10.5 g/dL — ABNORMAL LOW (ref 12.0–15.0)
Hemoglobin: 9.9 g/dL — ABNORMAL LOW (ref 12.0–15.0)
Hemoglobin: 9.5 g/dL — ABNORMAL LOW (ref 12.0–15.0)
O2 Saturation: 95 %
O2 Saturation: 94 %
O2 Saturation: 96 %
O2 Saturation: 98 %
Patient temperature: 36
Patient temperature: 36.5
Patient temperature: 35.8
Potassium: 3.9 mmol/L (ref 3.5–5.1)
Potassium: 3.8 mmol/L (ref 3.5–5.1)
Potassium: 4.1 mmol/L (ref 3.5–5.1)
Potassium: 4 mmol/L (ref 3.5–5.1)
Sodium: 137 mmol/L (ref 135–145)
Sodium: 138 mmol/L (ref 135–145)
Sodium: 138 mmol/L (ref 135–145)
Sodium: 142 mmol/L (ref 135–145)
TCO2: 36 mmol/L — ABNORMAL HIGH (ref 22–32)
TCO2: 34 mmol/L — ABNORMAL HIGH (ref 22–32)
TCO2: 38 mmol/L — ABNORMAL HIGH (ref 22–32)
TCO2: 38 mmol/L — ABNORMAL HIGH (ref 22–32)
pCO2 arterial: 53.6 mmHg — ABNORMAL HIGH (ref 32–48)
pCO2 arterial: 55.8 mmHg — ABNORMAL HIGH (ref 32–48)
pCO2 arterial: 50.6 mmHg — ABNORMAL HIGH (ref 32–48)
pCO2 arterial: 50.6 mmHg — ABNORMAL HIGH (ref 32–48)
pH, Arterial: 7.411 (ref 7.35–7.45)
pH, Arterial: 7.377 (ref 7.35–7.45)
pH, Arterial: 7.461 — ABNORMAL HIGH (ref 7.35–7.45)
pH, Arterial: 7.455 — ABNORMAL HIGH (ref 7.35–7.45)
pO2, Arterial: 76 mmHg — ABNORMAL LOW (ref 83–108)
pO2, Arterial: 72 mmHg — ABNORMAL LOW (ref 83–108)
pO2, Arterial: 77 mmHg — ABNORMAL LOW (ref 83–108)
pO2, Arterial: 104 mmHg (ref 83–108)

## 2024-05-08 LAB — CULTURE, RESPIRATORY W GRAM STAIN
Culture: NORMAL
Gram Stain: NONE SEEN

## 2024-05-08 LAB — URINE CULTURE: Culture: NO GROWTH

## 2024-05-08 LAB — APTT
aPTT: 29 s (ref 24–36)
aPTT: 29 s (ref 24–36)
aPTT: 29 s (ref 24–36)
aPTT: 32 s (ref 24–36)

## 2024-05-08 LAB — CBC
HCT: 32 % — ABNORMAL LOW (ref 36.0–46.0)
HCT: 32.6 % — ABNORMAL LOW (ref 36.0–46.0)
HCT: 31.1 % — ABNORMAL LOW (ref 36.0–46.0)
Hemoglobin: 9.6 g/dL — ABNORMAL LOW (ref 12.0–15.0)
Hemoglobin: 9.7 g/dL — ABNORMAL LOW (ref 12.0–15.0)
Hemoglobin: 9.5 g/dL — ABNORMAL LOW (ref 12.0–15.0)
MCH: 29.4 pg (ref 26.0–34.0)
MCH: 29.1 pg (ref 26.0–34.0)
MCH: 29.6 pg (ref 26.0–34.0)
MCHC: 30 g/dL (ref 30.0–36.0)
MCHC: 29.8 g/dL — ABNORMAL LOW (ref 30.0–36.0)
MCHC: 30.5 g/dL (ref 30.0–36.0)
MCV: 97.9 fL (ref 80.0–100.0)
MCV: 97.9 fL (ref 80.0–100.0)
MCV: 96.9 fL (ref 80.0–100.0)
Platelets: 117 10*3/uL — ABNORMAL LOW (ref 150–400)
Platelets: 120 10*3/uL — ABNORMAL LOW (ref 150–400)
Platelets: 118 10*3/uL — ABNORMAL LOW (ref 150–400)
RBC: 3.27 MIL/uL — ABNORMAL LOW (ref 3.87–5.11)
RBC: 3.33 MIL/uL — ABNORMAL LOW (ref 3.87–5.11)
RBC: 3.21 MIL/uL — ABNORMAL LOW (ref 3.87–5.11)
RDW: 14.3 % (ref 11.5–15.5)
RDW: 14.5 % (ref 11.5–15.5)
RDW: 14.5 % (ref 11.5–15.5)
WBC: 17 10*3/uL — ABNORMAL HIGH (ref 4.0–10.5)
WBC: 16 10*3/uL — ABNORMAL HIGH (ref 4.0–10.5)
WBC: 15.8 10*3/uL — ABNORMAL HIGH (ref 4.0–10.5)
nRBC: 0 % (ref 0.0–0.2)
nRBC: 0 % (ref 0.0–0.2)
nRBC: 0 % (ref 0.0–0.2)

## 2024-05-08 LAB — CBC WITH DIFFERENTIAL/PLATELET
Abs Immature Granulocytes: 0.15 10*3/uL — ABNORMAL HIGH (ref 0.00–0.07)
Basophils Absolute: 0 10*3/uL (ref 0.0–0.1)
Basophils Relative: 0 %
Eosinophils Absolute: 0.3 10*3/uL (ref 0.0–0.5)
Eosinophils Relative: 2 %
HCT: 33.2 % — ABNORMAL LOW (ref 36.0–46.0)
Hemoglobin: 9.9 g/dL — ABNORMAL LOW (ref 12.0–15.0)
Immature Granulocytes: 1 %
Lymphocytes Relative: 6 %
Lymphs Abs: 1 10*3/uL (ref 0.7–4.0)
MCH: 29 pg (ref 26.0–34.0)
MCHC: 29.8 g/dL — ABNORMAL LOW (ref 30.0–36.0)
MCV: 97.4 fL (ref 80.0–100.0)
Monocytes Absolute: 1.7 10*3/uL — ABNORMAL HIGH (ref 0.1–1.0)
Monocytes Relative: 10 %
Neutro Abs: 13.2 10*3/uL — ABNORMAL HIGH (ref 1.7–7.7)
Neutrophils Relative %: 81 %
Platelets: 122 10*3/uL — ABNORMAL LOW (ref 150–400)
RBC: 3.41 MIL/uL — ABNORMAL LOW (ref 3.87–5.11)
RDW: 14.5 % (ref 11.5–15.5)
WBC: 16.3 10*3/uL — ABNORMAL HIGH (ref 4.0–10.5)
nRBC: 0 % (ref 0.0–0.2)

## 2024-05-08 LAB — PROTIME-INR
INR: 1.1 (ref 0.8–1.2)
INR: 1.1 (ref 0.8–1.2)
INR: 1.1 (ref 0.8–1.2)
INR: 1.2 (ref 0.8–1.2)
Prothrombin Time: 14.2 s (ref 11.4–15.2)
Prothrombin Time: 14.3 s (ref 11.4–15.2)
Prothrombin Time: 14.9 s (ref 11.4–15.2)
Prothrombin Time: 15.2 s (ref 11.4–15.2)

## 2024-05-08 LAB — BILIRUBIN, DIRECT
Bilirubin, Direct: 0.1 mg/dL (ref 0.0–0.2)
Bilirubin, Direct: 0.1 mg/dL (ref 0.0–0.2)
Bilirubin, Direct: 0.1 mg/dL (ref 0.0–0.2)
Bilirubin, Direct: <0.1 mg/dL (ref 0.0–0.2)

## 2024-05-08 LAB — FIBRINOGEN
Fibrinogen: 589 mg/dL — ABNORMAL HIGH (ref 210–475)
Fibrinogen: 567 mg/dL — ABNORMAL HIGH (ref 210–475)
Fibrinogen: 652 mg/dL — ABNORMAL HIGH (ref 210–475)
Fibrinogen: 668 mg/dL — ABNORMAL HIGH (ref 210–475)

## 2024-05-08 LAB — GLUCOSE, CAPILLARY
Glucose-Capillary: 101 mg/dL — ABNORMAL HIGH (ref 70–99)
Glucose-Capillary: 103 mg/dL — ABNORMAL HIGH (ref 70–99)
Glucose-Capillary: 96 mg/dL (ref 70–99)
Glucose-Capillary: 100 mg/dL — ABNORMAL HIGH (ref 70–99)

## 2024-05-08 LAB — CALCIUM, IONIZED: Calcium, Ionized, Serum: 4.4 mg/dL — ABNORMAL LOW (ref 4.5–5.6)

## 2024-05-08 LAB — PHOSPHORUS
Phosphorus: 3.2 mg/dL (ref 2.5–4.6)
Phosphorus: 3.3 mg/dL (ref 2.5–4.6)
Phosphorus: 3.6 mg/dL (ref 2.5–4.6)
Phosphorus: 3.9 mg/dL (ref 2.5–4.6)

## 2024-05-08 LAB — MAGNESIUM
Magnesium: 2.5 mg/dL — ABNORMAL HIGH (ref 1.7–2.4)
Magnesium: 2.2 mg/dL (ref 1.7–2.4)
Magnesium: 2.2 mg/dL (ref 1.7–2.4)
Magnesium: 2.3 mg/dL (ref 1.7–2.4)

## 2024-05-08 LAB — ABO/RH: ABO/RH(D): A POS

## 2024-05-08 MED ORDER — MAGNESIUM SULFATE 2 GM/50ML IV SOLN
2.0000 g | Freq: Once | INTRAVENOUS | Status: AC
  Administered 2024-05-08: 2 g via INTRAVENOUS
  Filled 2024-05-08: qty 50

## 2024-05-08 MED ORDER — NOREPINEPHRINE 4 MG/250ML-% IV SOLN
0.1000 ug/min | INTRAVENOUS | Status: DC
  Administered 2024-05-08 – 2024-05-09 (×2): 3 ug/min via INTRAVENOUS
  Filled 2024-05-08 (×2): qty 250

## 2024-05-08 MED ORDER — POTASSIUM CHLORIDE 10 MEQ/100ML IV SOLN
10.0000 meq | INTRAVENOUS | Status: AC
  Administered 2024-05-08 (×4): 10 meq via INTRAVENOUS
  Filled 2024-05-08 (×4): qty 100

## 2024-05-08 MED ORDER — LACTATED RINGERS IV SOLN: INTRAVENOUS | Status: AC

## 2024-05-08 MED ORDER — POTASSIUM CHLORIDE 20 MEQ PO PACK
20.0000 meq | PACK | ORAL | Status: AC
  Administered 2024-05-08 (×2): 20 meq
  Filled 2024-05-08 (×2): qty 1

## 2024-05-08 NOTE — Progress Notes (Signed)
 [facility] Liaison Note  05/08/2024  [name] April 16, 1951 [id number]  Location: RN Hospital Liaison screened the patient remotely at [facility].  Insurance: [employer]   [name] is a 73 y.o. female who is a Primary Care Patient of [place], Lethia Raveling, MD. The patient was screened for readmission hospitalization with noted high Hanson score for unplanned readmission Hanson with 2 IP in 6 months.  The patient was assessed for potential Care Management service needs for post hospital transition for care coordination. Review of patient's electronic medical record reveals patient was admitted for cardiac arrest. No anticipated needs presented at this time for VBCI to address at  this time.  No TOC documentation on discharge disposition.  Plan: [facility] Liaison will continue to follow progress and disposition to asess for post hospital community care coordination/management needs.  Referral request for community care coordination: pending disposition.   VBCI Care Management/Population Health does not replace or interfere with any arrangements made by the Inpatient Transition of Care team.   For questions contact:   Lilla Reichert, RN, BSN Hospital Liaison [place]   [facility], Population Health Office Hours MTWF  8:00 am-6:00 pm Direct Dial: [phone] mobile [email or web address]

## 2024-05-08 NOTE — Progress Notes (Signed)
 Heart Failure Navigator Progress Note  Assessed for Heart & Vascular TOC clinic readiness.  Patient does not meet criteria due to transitioned to comfort care. No HF TOC. .   Navigator will sign off at this time.   Randie Bustle, BSN, Scientist, clinical (histocompatibility and immunogenetics) Only

## 2024-05-08 NOTE — Plan of Care (Signed)
  Problem: Education: Goal: Ability to describe self-care measures that may prevent or decrease complications (Diabetes Survival Skills Education) will improve Outcome: Not Progressing Goal: Individualized Educational Video(s) Outcome: Not Progressing   Problem: Coping: Goal: Ability to adjust to condition or change in health will improve Outcome: Not Progressing   Problem: Fluid Volume: Goal: Ability to maintain a balanced intake and output will improve Outcome: Not Progressing   Problem: Health Behavior/Discharge Planning: Goal: Ability to identify and utilize available resources and services will improve Outcome: Not Progressing Goal: Ability to manage health-related needs will improve Outcome: Not Progressing   Problem: Metabolic: Goal: Ability to maintain appropriate glucose levels will improve Outcome: Not Progressing   Problem: Nutritional: Goal: Maintenance of adequate nutrition will improve Outcome: Not Progressing Goal: Progress toward achieving an optimal weight will improve Outcome: Not Progressing   Problem: Skin Integrity: Goal: Risk for impaired skin integrity will decrease Outcome: Not Progressing   Problem: Tissue Perfusion: Goal: Adequacy of tissue perfusion will improve Outcome: Not Progressing   Problem: Education: Goal: Knowledge of General Education information will improve Description: Including pain rating scale, medication(s)/side effects and non-pharmacologic comfort measures Outcome: Not Progressing   Problem: Health Behavior/Discharge Planning: Goal: Ability to manage health-related needs will improve Outcome: Not Progressing   Problem: Clinical Measurements: Goal: Ability to maintain clinical measurements within normal limits will improve Outcome: Not Progressing Goal: Will remain free from infection Outcome: Not Progressing Goal: Diagnostic test results will improve Outcome: Not Progressing Goal: Respiratory complications will  improve Outcome: Not Progressing Goal: Cardiovascular complication will be avoided Outcome: Not Progressing   Problem: Activity: Goal: Risk for activity intolerance will decrease Outcome: Not Progressing   Problem: Nutrition: Goal: Adequate nutrition will be maintained Outcome: Not Progressing   Problem: Coping: Goal: Level of anxiety will decrease Outcome: Not Progressing   Problem: Elimination: Goal: Will not experience complications related to bowel motility Outcome: Not Progressing Goal: Will not experience complications related to urinary retention Outcome: Not Progressing   Problem: Pain Managment: Goal: General experience of comfort will improve and/or be controlled Outcome: Not Progressing   Problem: Safety: Goal: Ability to remain free from injury will improve Outcome: Not Progressing   Problem: Skin Integrity: Goal: Risk for impaired skin integrity will decrease Outcome: Not Progressing   Problem: Education: Goal: Knowledge of the prescribed therapeutic regimen will improve Outcome: Not Progressing   Problem: Coping: Goal: Ability to identify and develop effective coping behavior will improve Outcome: Not Progressing   Problem: Clinical Measurements: Goal: Quality of life will improve Outcome: Not Progressing   Problem: Respiratory: Goal: Verbalizations of increased ease of respirations will increase Outcome: Not Progressing   Problem: Role Relationship: Goal: Family's ability to cope with current situation will improve Outcome: Not Progressing Goal: Ability to verbalize concerns, feelings, and thoughts to partner or family member will improve Outcome: Not Progressing   Problem: Pain Management: Goal: Satisfaction with pain management regimen will improve Outcome: Not Progressing

## 2024-05-08 NOTE — Progress Notes (Signed)
 eLink Physician-Brief Progress Note Patient Name: [name] DOB: 1951-02-25 MRN: [id number]   Date of Service  05/08/2024  HPI/Events of Note  Patient needs an order for 5 units of PRBC to be available for the operating room tomorrow morning.  eICU Interventions  PRBC orders placed.        [name] U [name] 05/08/2024, 11:39 PM

## 2024-05-08 NOTE — Progress Notes (Signed)
 NAME:  [name], MRN:  [id number], DOB:  15-Dec-1951, LOS: 2 ADMISSION DATE:  05/05/2024, CONSULTATION DATE:  05/08/24 REFERRING MD:  EDP, CHIEF COMPLAINT:  cardiac arrest   History of Present Illness:  73 y.o. F with PMH significant for COPD on 2L home O2, CHF, HTN, chronic pain who was found unresponsive in bed with her CPAP on by a family member per report. Unknown down time, but CPR started by EMS with ROSC after three rounds of ACLS.  Pt was unresponsive post-rosc and had severe seizure-like/myoclonic jerking in the ED.  CTH with  symmetric hypodensities involving the caudate nuclei bilaterally and CXR with R basilar disease. Labs were significant for creainine 1.26, lactic acid 13.8  Pertinent  Medical History   has a past medical history of Acute respiratory failure with hypoxia and hypercapnia (HCC) (07/19/2023), Anxiety, Atrophic vaginitis (10/09/2015), Baker's cyst, Blood in urine (10/09/2015), CHF (congestive heart failure) (HCC), COPD (chronic obstructive pulmonary disease) (HCC), Heart murmur, Hematuria (10/09/2015), Hypertension, Nicotine  addiction (04/07/2014), Oxygen  dependent, Pneumonia, Urinary retention (07/01/2020), Urinary urgency (01/25/2016), and Vaginal bleeding (10/09/2015).   Significant Hospital Events: Including procedures, antibiotic start and stop dates in addition to other pertinent events   5/12 admit to ICU post-arrest  Interim History / Subjective:  Seen this morning, remains intubated and sedated  Objective    Blood pressure 126/68, pulse 64, temperature (!) 96.1 F (35.6 C), resp. rate (!) 24, height 5\' 3"  (1.6 m), weight 84.7 kg, SpO2 94%.    Vent Mode: PRVC FiO2 (%):  [40 %] 40 % Set Rate:  [22 bmp-24 bmp] 24 bmp Vt Set:  [420 mL-470 mL] 470 mL PEEP:  [5 cmH20] 5 cmH20 Plateau Pressure:  [22 cmH20-30 cmH20] 30 cmH20   Intake/Output Summary (Last 24 hours) at 05/08/2024 0832 Last data filed at 05/08/2024 0600 Gross per 24 hour  Intake  1931.45 ml  Output 880 ml  Net 1051.45 ml   Filed Weights   05/06/24 0300 04/29/2024 0500 05/08/24 0500  Weight: 82.8 kg 88.3 kg 84.7 kg    General:  critically ill-appearing F intubated and sedated HEENT: MM pink/moist, ETT in place Neuro: Not responsive to painful or vocal stimuli, pupils are blown CV: s1s2 rrr, no m/r/g PULM:  scattered rhonchi bilaterally  GI: soft, bsx4 active  Extremities: warm/dry, no edema     Resolved Hospital Problem list     Assessment & Plan:   Cerebellar Herniation  Unwitnessed out of hospital cardiac arrest  Acute on chronic hypoxic respiratory failure in setting of baseline COPD Shock, likely distributive vs cardiogenic  Family has been intermittently stopping by, and agree that if ventilation is stopped she will likely pass. She is currently being evaluated for organ transplant candidacy. Will defer to transplant team regarding her orders.       Best Practice (right click and "Reselect all SmartList Selections" daily)   Diet/type: NPO DVT prophylaxis LMWH Pressure ulcer(s): N/A GI prophylaxis: H2B Lines: N/A Foley:  Yes, and it is still needed Code Status:  full code Last date of multidisciplinary goals of care discussion [Pt's sister updated 5/14]  Labs   CBC: Recent Labs  Lab 05/06/24 0006 05/06/24 0422 04/30/2024 1644 05/23/2024 2220 05/08/24 0340 05/08/24 0532 05/08/24 0816  WBC 9.3  --   --  16.8*  --  16.3*  --   NEUTROABS 5.6  --   --   --   --  13.2*  --   HGB 10.6*   < >  10.2* 9.8* 10.2* 9.9* 10.5*  HCT 39.1   < > 30.0* 33.3* 30.0* 33.2* 31.0*  MCV 108.9*  --   --  97.9  --  97.4  --   PLT 154  --   --  133*  --  122*  --    < > = values in this interval not displayed.    Basic Metabolic Panel: Recent Labs  Lab 05/06/24 0006 05/06/24 0401 05/06/24 0422 05/24/2024 1718 05/17/2024 2129 05/08/24 0340 05/08/24 0532 05/08/24 0816  NA 140 140   < > 142 140 137 138 138  K 4.1 3.8   < > 3.2* 3.2* 3.9 4.0 3.8  CL 88*  86*  --  94* 96*  --  96*  --   CO2 25 38*  --  36* 35*  --  33*  --   GLUCOSE 216* 225*  --  121* 117*  --  97  --   BUN 17 21  --  16 17  --  20  --   CREATININE 1.26* 1.02*  --  0.66 0.73  --  0.85  --   CALCIUM  8.0* 7.8*  --  8.1* 8.1*  --  8.2*  --   MG 2.5* 2.0  --  1.7 1.7  --  2.5*  --   PHOS  --  5.5*  --  3.5 3.2  --  3.2  --    < > = values in this interval not displayed.   GFR: Estimated Creatinine Clearance: 63.5 mL/min (by C-G formula based on SCr of 0.85 mg/dL). Recent Labs  Lab 05/06/24 0005 05/06/24 0006 05/04/2024 2220 05/08/24 0532  WBC  --  9.3 16.8* 16.3*  LATICACIDVEN 13.8*  --   --   --     Liver Function Tests: Recent Labs  Lab 05/06/24 0006 05/02/2024 1718 05/18/2024 2129 05/08/24 0532  AST 132* 35 31 28  ALT 94* 64* 59* 55*  ALKPHOS 82 56 57 66  BILITOT 0.7 0.4 0.6 0.6  PROT 6.1* 5.4* 5.4* 5.6*  ALBUMIN 3.2* 2.5* 2.4* 2.5*   Recent Labs  Lab 05/06/24 0006  LIPASE 28   No results for input(s): "AMMONIA" in the last 168 hours.  ABG    Component Value Date/Time   PHART 7.377 05/08/2024 0816   PCO2ART 55.8 (H) 05/08/2024 0816   PO2ART 72 (L) 05/08/2024 0816   HCO3 32.8 (H) 05/08/2024 0816   TCO2 34 (H) 05/08/2024 0816   O2SAT 94 05/08/2024 0816     Coagulation Profile: Recent Labs  Lab 04/29/2024 1718 05/17/2024 2129 05/08/24 0532  INR 1.1 1.1 1.1    Cardiac Enzymes: No results for input(s): "CKTOTAL", "CKMB", "CKMBINDEX", "TROPONINI" in the last 168 hours.  HbA1C: Hgb A1c MFr Bld  Date/Time Value Ref Range Status  11/12/2023 04:36 PM 5.0 4.8 - 5.6 % Final    Comment:    (NOTE) Pre diabetes:          5.7%-6.4%  Diabetes:              >6.4%  Glycemic control for   <7.0% adults with diabetes   07/01/2022 02:07 AM 5.4 4.8 - 5.6 % Final    Comment:    (NOTE) Pre diabetes:          5.7%-6.4%  Diabetes:              >6.4%  Glycemic control for   <7.0% adults with diabetes     CBG: Recent  Labs  Lab 05/25/2024 1544  05/19/2024 1940 05/13/2024 2337 05/08/24 0345 05/08/24 0752  GLUCAP 125* 126* 112* 101* 103*    Review of Systems:   Unable to obtain   Past Medical History:  She,  has a past medical history of Acute respiratory failure with hypoxia and hypercapnia (HCC) (07/19/2023), Anxiety, Atrophic vaginitis (10/09/2015), Baker's cyst, Blood in urine (10/09/2015), CHF (congestive heart failure) (HCC), COPD (chronic obstructive pulmonary disease) (HCC), Heart murmur, Hematuria (10/09/2015), Hypertension, Nicotine  addiction (04/07/2014), Oxygen  dependent, Pneumonia, Urinary retention (07/01/2020), Urinary urgency (01/25/2016), and Vaginal bleeding (10/09/2015).   Surgical History:   Past Surgical History:  Procedure Laterality Date   ABDOMINAL HYSTERECTOMY     CATARACT EXTRACTION Left 03/2015   CESAREAN SECTION     COLONOSCOPY  10/19/2004   Dr. Riley Cheadle- internal hemorrhoids, o/w normal rectum, normal colon   COLONOSCOPY N/A 07/30/2014   UEA:[id number] diverticulosis. Colonic polyp-removed TA. repeat TCS 07/2021   COLONOSCOPY WITH PROPOFOL  N/A 03/29/2021   diverticulosis in sigmoid and descending colon, one 5 mm polyp ascending colon (adenoma), non-bleeding internal hemorrhoids. 5 year surveillance   ESOPHAGOGASTRODUODENOSCOPY N/A 07/30/2014   BJY:NWGNFA EGD   POLYPECTOMY  03/29/2021   Procedure: POLYPECTOMY;  Surgeon: Suzette Espy, MD;  Location: [facility] SUITE;  Service: Endoscopy;;   RADIAL HEAD ARTHROPLASTY Left 06/23/2017   Procedure: RADIAL HEAD ARTHROPLASTY WITH LIGAMENT REPAIR;  Surgeon: Jasmine Mesi, MD;  [facility];  Service: Orthopedics;  Laterality: Left;     Social History:   reports that she quit smoking about 11 years ago. Her smoking use included cigarettes. She started smoking about 51 years ago. She has been exposed to tobacco smoke. She has never used smokeless tobacco. She reports current alcohol use. She reports that she does not use drugs.   Family History:  Her family history  includes Blindness in her maternal grandfather; Cancer in her daughter, sister, sister, sister, and sister; Hypertension in her sister; Migraines in her daughter; Other in her brother, brother, sister, and sister. There is no history of Colon cancer. She was adopted.   Allergies Allergies  Allergen Reactions   Neurontin  [Gabapentin ] Other (See Comments)    Lightheadedness  Groggy      Home Medications  Prior to Admission medications   Medication Sig Start Date End Date Taking? Authorizing Provider  albuterol  (VENTOLIN  HFA) 108 (90 Base) MCG/ACT inhaler Inhale 2 puffs into the lungs every 4 (four) hours as needed for wheezing or shortness of breath. For shortness of breath 08/14/23   Diamond Formica, MD  ALPRAZolam  (XANAX ) 0.25 MG tablet Take 0.25 mg by mouth 2 (two) times daily as needed. 12/14/23   [provider]  aspirin  EC 81 MG tablet Take 81 mg by mouth daily.    [provider]  atorvastatin  (LIPITOR ) 80 MG tablet Take 1 tablet (80 mg total) by mouth daily. 07/07/22   Gonfa, Taye T, MD  bethanechol  (URECHOLINE ) 10 MG tablet Take 2 tablets (20 mg total) by mouth daily. 11/10/23   McKenzie, Arden Beck, MD  budesonide  (PULMICORT ) 0.25 MG/2ML nebulizer solution Take 2 mLs (0.25 mg total) by nebulization 2 (two) times daily. Patient taking differently: Take 0.25 mg by nebulization 2 (two) times daily as needed (for shortness of breath). 10/16/23 01/14/24  Unk Garb, DO  Fluticasone -Umeclidin-Vilant (TRELEGY ELLIPTA ) 100-62.5-25 MCG/ACT AEPB Inhale 1 puff into the lungs daily. 02/27/24   Diamond Formica, MD  furosemide  (LASIX ) 20 MG tablet Take 1 tablet (20 mg total) by mouth  daily. 11/17/23   Haydee Lipa, MD  HYDROcodone -acetaminophen  (NORCO) 10-325 MG tablet Take 1 tablet by mouth every 6 (six) hours as needed for moderate pain (pain score 4-6). 1 tablet every 3-4 hours as needed for pain. 07/14/22   [provider]  ipratropium-albuterol  (DUONEB) 0.5-2.5 (3)  MG/3ML SOLN Take 3 mLs by nebulization in the morning, at noon, in the evening, and at bedtime. Patient taking differently: Take 3 mLs by nebulization every 6 (six) hours as needed (For shortness of breath). 10/16/23 01/14/24  Unk Garb, DO  metoprolol  tartrate (LOPRESSOR ) 25 MG tablet Take 1 tablet (25 mg total) by mouth 2 (two) times daily. 11/20/23 02/18/24  Diamond Formica, MD  mirtazapine  (REMERON ) 7.5 MG tablet Take 7.5 mg by mouth at bedtime.    [provider]  Multiple Vitamins-Minerals (CENTRUM ADULT PO) Take 1 tablet by mouth daily.    [provider]  pantoprazole  (PROTONIX ) 20 MG tablet Take 1 tablet (20 mg total) by mouth daily. 07/08/22   Gonfa, Taye T, MD  tamsulosin  (FLOMAX ) 0.4 MG CAPS capsule Take 1 capsule (0.4 mg total) by mouth in the morning and at bedtime. 03/29/24   Marco Severs, MD     Critical care time: 60 minutes     CRITICAL CARE Performed by: Remedios Mckone   Total critical care time: 60 minutes  Critical care time was exclusive of separately billable procedures and treating other patients.  Critical care was necessary to treat or prevent imminent or life-threatening deterioration.  Critical care was time spent personally by me on the following activities: development of treatment plan with patient and/or surrogate as well as nursing, discussions with consultants, evaluation of patient's response to treatment, examination of patient, obtaining history from patient or surrogate, ordering and performing treatments and interventions, ordering and review of laboratory studies, ordering and review of radiographic studies, pulse oximetry and re-evaluation of patient's condition.    Kaitlan Bin, MD [facility] Internal Medicine Resident, PGY-2 See Amion for pager If no response to pager , please call [phone] until 7pm After 7:00 pm call Elink  [phone]

## 2024-05-09 ENCOUNTER — Encounter (HOSPITAL_COMMUNITY): Payer: Self-pay | Admitting: Certified Registered Nurse Anesthetist

## 2024-05-09 ENCOUNTER — Other Ambulatory Visit: Payer: Self-pay

## 2024-05-09 ENCOUNTER — Encounter (HOSPITAL_COMMUNITY): Admission: EM | Disposition: E | Payer: Self-pay | Source: Home / Self Care | Attending: Pulmonary Disease

## 2024-05-09 DIAGNOSIS — R570 Cardiogenic shock: Secondary | ICD-10-CM | POA: Diagnosis not present

## 2024-05-09 DIAGNOSIS — J8410 Pulmonary fibrosis, unspecified: Secondary | ICD-10-CM | POA: Diagnosis not present

## 2024-05-09 DIAGNOSIS — J9621 Acute and chronic respiratory failure with hypoxia: Secondary | ICD-10-CM | POA: Diagnosis not present

## 2024-05-09 DIAGNOSIS — I469 Cardiac arrest, cause unspecified: Secondary | ICD-10-CM | POA: Diagnosis not present

## 2024-05-09 DIAGNOSIS — K760 Fatty (change of) liver, not elsewhere classified: Secondary | ICD-10-CM | POA: Diagnosis not present

## 2024-05-09 DIAGNOSIS — J984 Other disorders of lung: Secondary | ICD-10-CM | POA: Diagnosis not present

## 2024-05-09 DIAGNOSIS — G931 Anoxic brain damage, not elsewhere classified: Secondary | ICD-10-CM | POA: Diagnosis not present

## 2024-05-09 DIAGNOSIS — M269 Dentofacial anomaly, unspecified: Secondary | ICD-10-CM | POA: Diagnosis not present

## 2024-05-09 DIAGNOSIS — N269 Renal sclerosis, unspecified: Secondary | ICD-10-CM | POA: Diagnosis not present

## 2024-05-09 HISTORY — PX: ORGAN PROCUREMENT: SHX5270

## 2024-05-09 LAB — GLUCOSE, CAPILLARY
Glucose-Capillary: 100 mg/dL — ABNORMAL HIGH (ref 70–99)
Glucose-Capillary: 106 mg/dL — ABNORMAL HIGH (ref 70–99)
Glucose-Capillary: 108 mg/dL — ABNORMAL HIGH (ref 70–99)
Glucose-Capillary: 111 mg/dL — ABNORMAL HIGH (ref 70–99)
Glucose-Capillary: 116 mg/dL — ABNORMAL HIGH (ref 70–99)
Glucose-Capillary: 99 mg/dL (ref 70–99)

## 2024-05-09 LAB — COMPREHENSIVE METABOLIC PANEL WITH GFR
ALT: 44 U/L (ref 0–44)
ALT: 40 U/L (ref 0–44)
ALT: 38 U/L (ref 0–44)
AST: 22 U/L (ref 15–41)
AST: 21 U/L (ref 15–41)
AST: 21 U/L (ref 15–41)
Albumin: 2.1 g/dL — ABNORMAL LOW (ref 3.5–5.0)
Albumin: 2.1 g/dL — ABNORMAL LOW (ref 3.5–5.0)
Albumin: 2.1 g/dL — ABNORMAL LOW (ref 3.5–5.0)
Alkaline Phosphatase: 79 U/L (ref 38–126)
Alkaline Phosphatase: 83 U/L (ref 38–126)
Alkaline Phosphatase: 91 U/L (ref 38–126)
Anion gap: 9 (ref 5–15)
Anion gap: 11 (ref 5–15)
Anion gap: 8 (ref 5–15)
BUN: 19 mg/dL (ref 8–23)
BUN: 20 mg/dL (ref 8–23)
BUN: 19 mg/dL (ref 8–23)
CO2: 32 mmol/L (ref 22–32)
CO2: 30 mmol/L (ref 22–32)
CO2: 33 mmol/L — ABNORMAL HIGH (ref 22–32)
Calcium: 8.3 mg/dL — ABNORMAL LOW (ref 8.9–10.3)
Calcium: 8.5 mg/dL — ABNORMAL LOW (ref 8.9–10.3)
Calcium: 8.6 mg/dL — ABNORMAL LOW (ref 8.9–10.3)
Chloride: 104 mmol/L (ref 98–111)
Chloride: 106 mmol/L (ref 98–111)
Chloride: 107 mmol/L (ref 98–111)
Creatinine, Ser: 0.81 mg/dL (ref 0.44–1.00)
Creatinine, Ser: 0.87 mg/dL (ref 0.44–1.00)
Creatinine, Ser: 0.86 mg/dL (ref 0.44–1.00)
GFR, Estimated: >60 mL/min (ref >60)
GFR, Estimated: >60 mL/min (ref >60)
GFR, Estimated: >60 mL/min (ref >60)
Glucose, Bld: 103 mg/dL — ABNORMAL HIGH (ref 70–99)
Glucose, Bld: 110 mg/dL — ABNORMAL HIGH (ref 70–99)
Glucose, Bld: 115 mg/dL — ABNORMAL HIGH (ref 70–99)
Potassium: 4.2 mmol/L (ref 3.5–5.1)
Potassium: 4.2 mmol/L (ref 3.5–5.1)
Potassium: 4.1 mmol/L (ref 3.5–5.1)
Sodium: 145 mmol/L (ref 135–145)
Sodium: 147 mmol/L — ABNORMAL HIGH (ref 135–145)
Sodium: 148 mmol/L — ABNORMAL HIGH (ref 135–145)
Total Bilirubin: 0.7 mg/dL (ref 0.0–1.2)
Total Bilirubin: 0.6 mg/dL (ref 0.0–1.2)
Total Bilirubin: 0.6 mg/dL (ref 0.0–1.2)
Total Protein: 5.3 g/dL — ABNORMAL LOW (ref 6.5–8.1)
Total Protein: 5.4 g/dL — ABNORMAL LOW (ref 6.5–8.1)
Total Protein: 5.4 g/dL — ABNORMAL LOW (ref 6.5–8.1)

## 2024-05-09 LAB — CBC
HCT: 32.5 % — ABNORMAL LOW (ref 36.0–46.0)
HCT: 32 % — ABNORMAL LOW (ref 36.0–46.0)
HCT: 32.3 % — ABNORMAL LOW (ref 36.0–46.0)
Hemoglobin: 9.6 g/dL — ABNORMAL LOW (ref 12.0–15.0)
Hemoglobin: 9.6 g/dL — ABNORMAL LOW (ref 12.0–15.0)
Hemoglobin: 9.6 g/dL — ABNORMAL LOW (ref 12.0–15.0)
MCH: 28.7 pg (ref 26.0–34.0)
MCH: 29.1 pg (ref 26.0–34.0)
MCH: 28.9 pg (ref 26.0–34.0)
MCHC: 29.5 g/dL — ABNORMAL LOW (ref 30.0–36.0)
MCHC: 30 g/dL (ref 30.0–36.0)
MCHC: 29.7 g/dL — ABNORMAL LOW (ref 30.0–36.0)
MCV: 97.3 fL (ref 80.0–100.0)
MCV: 97 fL (ref 80.0–100.0)
MCV: 97.3 fL (ref 80.0–100.0)
Platelets: 126 10*3/uL — ABNORMAL LOW (ref 150–400)
Platelets: 126 10*3/uL — ABNORMAL LOW (ref 150–400)
Platelets: 135 10*3/uL — ABNORMAL LOW (ref 150–400)
RBC: 3.34 MIL/uL — ABNORMAL LOW (ref 3.87–5.11)
RBC: 3.3 MIL/uL — ABNORMAL LOW (ref 3.87–5.11)
RBC: 3.32 MIL/uL — ABNORMAL LOW (ref 3.87–5.11)
RDW: 14.6 % (ref 11.5–15.5)
RDW: 14.6 % (ref 11.5–15.5)
RDW: 14.7 % (ref 11.5–15.5)
WBC: 16.1 10*3/uL — ABNORMAL HIGH (ref 4.0–10.5)
WBC: 17 10*3/uL — ABNORMAL HIGH (ref 4.0–10.5)
WBC: 16.6 10*3/uL — ABNORMAL HIGH (ref 4.0–10.5)
nRBC: 0 % (ref 0.0–0.2)
nRBC: 0 % (ref 0.0–0.2)
nRBC: 0 % (ref 0.0–0.2)

## 2024-05-09 LAB — PROTIME-INR
INR: 1.2 (ref 0.8–1.2)
INR: 1.2 (ref 0.8–1.2)
INR: 1.3 — ABNORMAL HIGH (ref 0.8–1.2)
Prothrombin Time: 15.2 s (ref 11.4–15.2)
Prothrombin Time: 15.4 s — ABNORMAL HIGH (ref 11.4–15.2)
Prothrombin Time: 16.4 s — ABNORMAL HIGH (ref 11.4–15.2)

## 2024-05-09 LAB — URINALYSIS, ROUTINE W REFLEX MICROSCOPIC
Bilirubin Urine: NEGATIVE
Glucose, UA: NEGATIVE mg/dL
Ketones, ur: NEGATIVE mg/dL
Nitrite: NEGATIVE
Protein, ur: NEGATIVE mg/dL
Specific Gravity, Urine: 1.004 — ABNORMAL LOW (ref 1.005–1.030)
pH: 6 (ref 5.0–8.0)

## 2024-05-09 LAB — POCT I-STAT 7, (LYTES, BLD GAS, ICA,H+H)
Acid-Base Excess: 8 mmol/L — ABNORMAL HIGH (ref 0.0–2.0)
Acid-Base Excess: 11 mmol/L — ABNORMAL HIGH (ref 0.0–2.0)
Acid-Base Excess: 9 mmol/L — ABNORMAL HIGH (ref 0.0–2.0)
Bicarbonate: 34.4 mmol/L — ABNORMAL HIGH (ref 20.0–28.0)
Bicarbonate: 36 mmol/L — ABNORMAL HIGH (ref 20.0–28.0)
Bicarbonate: 34.3 mmol/L — ABNORMAL HIGH (ref 20.0–28.0)
Calcium, Ion: 1.17 mmol/L (ref 1.15–1.40)
Calcium, Ion: 1.17 mmol/L (ref 1.15–1.40)
Calcium, Ion: 1.17 mmol/L (ref 1.15–1.40)
HCT: 29 % — ABNORMAL LOW (ref 36.0–46.0)
HCT: 29 % — ABNORMAL LOW (ref 36.0–46.0)
HCT: 28 % — ABNORMAL LOW (ref 36.0–46.0)
Hemoglobin: 9.9 g/dL — ABNORMAL LOW (ref 12.0–15.0)
Hemoglobin: 9.9 g/dL — ABNORMAL LOW (ref 12.0–15.0)
Hemoglobin: 9.5 g/dL — ABNORMAL LOW (ref 12.0–15.0)
O2 Saturation: 97 %
O2 Saturation: 96 %
O2 Saturation: 96 %
Patient temperature: 36.6
Patient temperature: 36.8
Patient temperature: 36
Potassium: 4 mmol/L (ref 3.5–5.1)
Potassium: 4.2 mmol/L (ref 3.5–5.1)
Potassium: 3.9 mmol/L (ref 3.5–5.1)
Sodium: 144 mmol/L (ref 135–145)
Sodium: 145 mmol/L (ref 135–145)
Sodium: 148 mmol/L — ABNORMAL HIGH (ref 135–145)
TCO2: 36 mmol/L — ABNORMAL HIGH (ref 22–32)
TCO2: 38 mmol/L — ABNORMAL HIGH (ref 22–32)
TCO2: 36 mmol/L — ABNORMAL HIGH (ref 22–32)
pCO2 arterial: 53.5 mmHg — ABNORMAL HIGH (ref 32–48)
pCO2 arterial: 51.4 mmHg — ABNORMAL HIGH (ref 32–48)
pCO2 arterial: 48.7 mmHg — ABNORMAL HIGH (ref 32–48)
pH, Arterial: 7.414 (ref 7.35–7.45)
pH, Arterial: 7.452 — ABNORMAL HIGH (ref 7.35–7.45)
pH, Arterial: 7.452 — ABNORMAL HIGH (ref 7.35–7.45)
pO2, Arterial: 88 mmHg (ref 83–108)
pO2, Arterial: 81 mmHg — ABNORMAL LOW (ref 83–108)
pO2, Arterial: 79 mmHg — ABNORMAL LOW (ref 83–108)

## 2024-05-09 LAB — BILIRUBIN, DIRECT
Bilirubin, Direct: 0.2 mg/dL (ref 0.0–0.2)
Bilirubin, Direct: 0.2 mg/dL (ref 0.0–0.2)
Bilirubin, Direct: 0.1 mg/dL (ref 0.0–0.2)

## 2024-05-09 LAB — PREPARE RBC (CROSSMATCH)

## 2024-05-09 LAB — CALCIUM, IONIZED
Calcium, Ionized, Serum: 4.5 mg/dL (ref 4.5–5.6)
Calcium, Ionized, Serum: 4.6 mg/dL (ref 4.5–5.6)
Calcium, Ionized, Serum: 4.8 mg/dL (ref 4.5–5.6)
Calcium, Ionized, Serum: 4.9 mg/dL (ref 4.5–5.6)

## 2024-05-09 LAB — PHOSPHORUS
Phosphorus: 4 mg/dL (ref 2.5–4.6)
Phosphorus: 4.3 mg/dL (ref 2.5–4.6)
Phosphorus: 4.2 mg/dL (ref 2.5–4.6)

## 2024-05-09 LAB — APTT
aPTT: 28 s (ref 24–36)
aPTT: 31 s (ref 24–36)
aPTT: 34 s (ref 24–36)

## 2024-05-09 LAB — FIBRINOGEN
Fibrinogen: 724 mg/dL — ABNORMAL HIGH (ref 210–475)
Fibrinogen: 745 mg/dL — ABNORMAL HIGH (ref 210–475)
Fibrinogen: >800 mg/dL — ABNORMAL HIGH (ref 210–475)

## 2024-05-09 LAB — MAGNESIUM
Magnesium: 2.4 mg/dL (ref 1.7–2.4)
Magnesium: 2.4 mg/dL (ref 1.7–2.4)
Magnesium: 2.6 mg/dL — ABNORMAL HIGH (ref 1.7–2.4)

## 2024-05-09 SURGERY — SURGICAL PROCUREMENT, ORGAN
Anesthesia: Choice

## 2024-05-09 MED ORDER — MORPHINE BOLUS VIA INFUSION: 5.0000 mg | INTRAVENOUS | Status: DC | PRN

## 2024-05-09 MED ORDER — MIDAZOLAM BOLUS VIA INFUSION
0.0000 mg | INTRAVENOUS | Status: DC | PRN
  Administered 2024-05-09: 5 mg via INTRAVENOUS
  Administered 2024-05-09: 3 mg via INTRAVENOUS
  Administered 2024-05-09: 2 mg via INTRAVENOUS
  Administered 2024-05-09: 5 mg via INTRAVENOUS

## 2024-05-09 MED ORDER — 0.9 % SODIUM CHLORIDE (POUR BTL) OPTIME
TOPICAL | Status: DC | PRN
  Administered 2024-05-09: 4000 mL
  Administered 2024-05-09: 7000 mL

## 2024-05-09 MED ORDER — MIDAZOLAM-SODIUM CHLORIDE 100-0.9 MG/100ML-% IV SOLN: 0.5000 mg/h | INTRAVENOUS | Status: DC

## 2024-05-09 MED ORDER — PAPAVERINE HCL 30 MG/ML IJ SOLN
60.0000 mg | Freq: Once | INTRAMUSCULAR | Status: DC
  Filled 2024-05-09 (×2): qty 2

## 2024-05-09 MED ORDER — HEPARIN SODIUM (PORCINE) 10000 UNIT/ML IJ SOLN
30000.0000 [IU] | Freq: Once | INTRAMUSCULAR | Status: AC
  Administered 2024-05-09: 30000 [IU] via INTRAVENOUS
  Filled 2024-05-09 (×2): qty 3

## 2024-05-09 MED ORDER — MORPHINE BOLUS VIA INFUSION
5.0000 mg | INTRAVENOUS | Status: DC
Start: 2024-05-09 — End: 2024-05-09
  Administered 2024-05-09: 5 mg via INTRAVENOUS

## 2024-05-09 MED ORDER — SODIUM CHLORIDE 0.9% IV SOLUTION: Freq: Once | INTRAVENOUS | Status: DC

## 2024-05-09 MED ORDER — MIDAZOLAM-SODIUM CHLORIDE 100-0.9 MG/100ML-% IV SOLN
0.5000 mg/h | INTRAVENOUS | Status: DC
  Administered 2024-05-09: 1 mg/h via INTRAVENOUS
  Filled 2024-05-09: qty 100

## 2024-05-09 MED ORDER — MIDAZOLAM BOLUS VIA INFUSION: 0.0000 mg | INTRAVENOUS | Status: DC | PRN

## 2024-05-09 MED ORDER — MORPHINE 100MG IN NS 100ML (1MG/ML) PREMIX INFUSION
5.0000 mg/h | INTRAVENOUS | Status: DC
  Administered 2024-05-09: 5 mg/h via INTRAVENOUS
  Filled 2024-05-09: qty 100

## 2024-05-09 SURGICAL SUPPLY — 82 items
BAG COUNTER SPONGE SURGICOUNT (BAG) ×1 IMPLANT
BLADE CLIPPER SURG (BLADE) IMPLANT
BLADE SAW STERNAL (BLADE) ×1 IMPLANT
BLADE STERNUM SYSTEM 6 (BLADE) IMPLANT
BLADE SURG 10 STRL SS (BLADE) IMPLANT
CLIP APPLIE 11 MED OPEN (CLIP) ×1 IMPLANT
CLIP TI MEDIUM 24 (CLIP) IMPLANT
CLIP TI WIDE RED SMALL 24 (CLIP) IMPLANT
CNTNR URN SCR LID CUP LEK RST (MISCELLANEOUS) ×1 IMPLANT
CONN Y 3/8X3/8X3/8 BEN (MISCELLANEOUS) IMPLANT
COUNTER NDL 20CT MAGNET RED (NEEDLE) IMPLANT
COVER BACK TABLE 60X90IN (DRAPES) IMPLANT
COVER MAYO STAND STRL (DRAPES) IMPLANT
COVER SURGICAL LIGHT HANDLE (MISCELLANEOUS) ×1 IMPLANT
DRAPE HALF SHEET 40X57 (DRAPES) IMPLANT
DRAPE INCISE IOBAN 66X45 STRL (DRAPES) IMPLANT
DRAPE SLUSH MACHINE 52X66 (DRAPES) ×1 IMPLANT
DRSG COVADERM 4X10 (GAUZE/BANDAGES/DRESSINGS) IMPLANT
DRSG COVADERM 4X8 (GAUZE/BANDAGES/DRESSINGS) IMPLANT
DRSG TELFA 3X8 NADH STRL (GAUZE/BANDAGES/DRESSINGS) ×1 IMPLANT
DURAPREP 26ML APPLICATOR (WOUND CARE) IMPLANT
ELECT BLADE 6.5 EXT (BLADE) IMPLANT
ELECTRODE REM PT RTRN 9FT ADLT (ELECTROSURGICAL) ×2 IMPLANT
GAUZE 4X4 16PLY ~~LOC~~+RFID DBL (SPONGE) IMPLANT
GLOVE BIO SURGEON STRL SZ7 (GLOVE) IMPLANT
GLOVE BIO SURGEON STRL SZ7.5 (GLOVE) IMPLANT
GLOVE BIO SURGEON STRL SZ8 (GLOVE) IMPLANT
GLOVE BIO SURGEON STRL SZ8.5 (GLOVE) IMPLANT
GLOVE BIOGEL PI IND STRL 7.0 (GLOVE) IMPLANT
GLOVE BIOGEL PI IND STRL 7.5 (GLOVE) IMPLANT
GLOVE BIOGEL PI IND STRL 8 (GLOVE) IMPLANT
GLOVE BIOGEL PI IND STRL 8.5 (GLOVE) IMPLANT
GLOVE SURG SS PI 7.0 STRL IVOR (GLOVE) IMPLANT
GLOVE SURG SS PI 7.5 STRL IVOR (GLOVE) IMPLANT
GLOVE SURG SS PI 8.0 STRL IVOR (GLOVE) IMPLANT
GOWN STRL REUS W/ TWL LRG LVL3 (GOWN DISPOSABLE) ×4 IMPLANT
GOWN STRL REUS W/ TWL XL LVL3 (GOWN DISPOSABLE) ×2 IMPLANT
HANDLE SUCTION POOLE (INSTRUMENTS) IMPLANT
KIT POST MORTEM ADULT 36X90 (BAG) ×1 IMPLANT
KIT TURNOVER KIT B (KITS) ×1 IMPLANT
LOOP VASCLR MAXI BLUE 18IN ST (MISCELLANEOUS) IMPLANT
LOOPS VASCLR MAXI BLUE 18IN ST (MISCELLANEOUS) IMPLANT
MANIFOLD NEPTUNE II (INSTRUMENTS) ×1 IMPLANT
NDL BIOPSY 14X6 SOFT TISS (NEEDLE) IMPLANT
NEEDLE BIOPSY 14X6 SOFT TISS (NEEDLE) ×1 IMPLANT
NS IRRIG 1000ML POUR BTL (IV SOLUTION) IMPLANT
PACK AORTA (CUSTOM PROCEDURE TRAY) ×1 IMPLANT
PAD ARMBOARD POSITIONER FOAM (MISCELLANEOUS) ×2 IMPLANT
PENCIL BUTTON HOLSTER BLD 10FT (ELECTRODE) ×1 IMPLANT
SOL PREP POV-IOD 4OZ 10% (MISCELLANEOUS) ×2 IMPLANT
SPONGE INTESTINAL PEANUT (DISPOSABLE) IMPLANT
SPONGE T-LAP 18X18 ~~LOC~~+RFID (SPONGE) IMPLANT
STAPLER VISISTAT 35W (STAPLE) ×1 IMPLANT
SUT BONE WAX W31G (SUTURE) IMPLANT
SUT ETHIBOND 5 LR DA (SUTURE) IMPLANT
SUT ETHILON 1 LR 30 (SUTURE) ×2 IMPLANT
SUT ETHILON 2 LR (SUTURE) IMPLANT
SUT PDS AB 3-0 SH 27 (SUTURE) IMPLANT
SUT PROLENE 3 0 SH 1 (SUTURE) IMPLANT
SUT PROLENE 4-0 RB1 .5 CRCL 36 (SUTURE) IMPLANT
SUT PROLENE 5 0 C 1 24 (SUTURE) IMPLANT
SUT PROLENE 6 0 BV (SUTURE) IMPLANT
SUT SILK 0 TIES 10X30 (SUTURE) IMPLANT
SUT SILK 1 SH (SUTURE) IMPLANT
SUT SILK 1 TIES 10X30 (SUTURE) IMPLANT
SUT SILK 2 0 SH (SUTURE) IMPLANT
SUT SILK 2 0 SH CR/8 (SUTURE) IMPLANT
SUT SILK 2 0 TIES 10X30 (SUTURE) IMPLANT
SUT SILK 2-0 18XBRD TIE 12 (SUTURE) IMPLANT
SUT SILK 3 0 SH CR/8 (SUTURE) IMPLANT
SUT SILK 3 0 TIES 10X30 (SUTURE) IMPLANT
SWAB COLLECTION DEVICE MRSA (MISCELLANEOUS) IMPLANT
SWAB CULTURE ESWAB REG 1ML (MISCELLANEOUS) IMPLANT
SYR 50ML LL SCALE MARK (SYRINGE) IMPLANT
SYR BULB IRRIG 60ML STRL (SYRINGE) IMPLANT
SYRINGE TOOMEY DISP (SYRINGE) IMPLANT
TAPE UMBILICAL 1/8 X36 TWILL (MISCELLANEOUS) IMPLANT
TUBE CONNECTING 12X1/4 (SUCTIONS) ×1 IMPLANT
TUBING MEDICAL 3X8X3X32 (MISCELLANEOUS) IMPLANT
VASCULAR TIE MINI RED 18IN STL (MISCELLANEOUS) IMPLANT
WATER STERILE IRR 1000ML POUR (IV SOLUTION) IMPLANT
YANKAUER SUCT BULB TIP NO VENT (SUCTIONS) ×1 IMPLANT

## 2024-05-10 ENCOUNTER — Encounter (HOSPITAL_COMMUNITY): Payer: Self-pay

## 2024-05-10 LAB — CALCIUM, IONIZED
Calcium, Ionized, Serum: 4.8 mg/dL (ref 4.5–5.6)
Calcium, Ionized, Serum: 5 mg/dL (ref 4.5–5.6)
Calcium, Ionized, Serum: 5.1 mg/dL (ref 4.5–5.6)
Calcium, Ionized, Serum: 5.1 mg/dL (ref 4.5–5.6)

## 2024-05-11 LAB — BPAM RBC
Blood Product Expiration Date: 202506122359
Blood Product Expiration Date: 202506142359
Blood Product Expiration Date: 202506142359
Blood Product Expiration Date: 202506162359
Blood Product Expiration Date: 202506162359
Blood Product Expiration Date: 202506162359
Blood Product Expiration Date: 202506162359
Blood Product Expiration Date: 202506172359
Blood Product Expiration Date: 202506172359
ISSUE DATE / TIME: 202505151506
ISSUE DATE / TIME: 202505151506
ISSUE DATE / TIME: 202505151506
ISSUE DATE / TIME: 202505151506
ISSUE DATE / TIME: 202505151506
ISSUE DATE / TIME: 202505151506
ISSUE DATE / TIME: 202505151506
ISSUE DATE / TIME: 202505151506
ISSUE DATE / TIME: 202505151506
Unit Type and Rh: 6200
Unit Type and Rh: 6200
Unit Type and Rh: 6200
Unit Type and Rh: 6200
Unit Type and Rh: 6200
Unit Type and Rh: 6200
Unit Type and Rh: 6200
Unit Type and Rh: 6200
Unit Type and Rh: 6200

## 2024-05-11 LAB — TYPE AND SCREEN
ABO/RH(D): A POS
Antibody Screen: NEGATIVE
PT AG Type: POSITIVE
Unit division: 0
Unit division: 0
Unit division: 0
Unit division: 0
Unit division: 0
Unit division: 0
Unit division: 0
Unit division: 0
Unit division: 0

## 2024-05-11 LAB — CULTURE, BLOOD (ROUTINE X 2)
Culture: NO GROWTH
Culture: NO GROWTH
Special Requests: ADEQUATE
Special Requests: ADEQUATE

## 2024-05-13 LAB — SURGICAL PATHOLOGY

## 2024-05-26 NOTE — Progress Notes (Signed)
 Pt one way extubated per donor services.

## 2024-05-26 NOTE — Progress Notes (Signed)
 Pt transported to OR

## 2024-05-26 NOTE — Plan of Care (Signed)
  Problem: Education: Goal: Ability to describe self-care measures that may prevent or decrease complications (Diabetes Survival Skills Education) will improve Outcome: Not Progressing Goal: Individualized Educational Video(s) Outcome: Not Progressing   Problem: Coping: Goal: Ability to adjust to condition or change in health will improve Outcome: Not Progressing   Problem: Fluid Volume: Goal: Ability to maintain a balanced intake and output will improve Outcome: Not Progressing   Problem: Health Behavior/Discharge Planning: Goal: Ability to identify and utilize available resources and services will improve Outcome: Not Progressing Goal: Ability to manage health-related needs will improve Outcome: Not Progressing   Problem: Metabolic: Goal: Ability to maintain appropriate glucose levels will improve Outcome: Not Progressing   Problem: Nutritional: Goal: Maintenance of adequate nutrition will improve Outcome: Not Progressing Goal: Progress toward achieving an optimal weight will improve Outcome: Not Progressing   Problem: Skin Integrity: Goal: Risk for impaired skin integrity will decrease Outcome: Not Progressing   Problem: Tissue Perfusion: Goal: Adequacy of tissue perfusion will improve Outcome: Not Progressing   Problem: Education: Goal: Knowledge of General Education information will improve Description: Including pain rating scale, medication(s)/side effects and non-pharmacologic comfort measures Outcome: Not Progressing   Problem: Health Behavior/Discharge Planning: Goal: Ability to manage health-related needs will improve Outcome: Not Progressing   Problem: Clinical Measurements: Goal: Ability to maintain clinical measurements within normal limits will improve Outcome: Not Progressing Goal: Will remain free from infection Outcome: Not Progressing Goal: Diagnostic test results will improve Outcome: Not Progressing Goal: Respiratory complications will  improve Outcome: Not Progressing Goal: Cardiovascular complication will be avoided Outcome: Not Progressing   Problem: Activity: Goal: Risk for activity intolerance will decrease Outcome: Not Progressing   Problem: Nutrition: Goal: Adequate nutrition will be maintained Outcome: Not Progressing   Problem: Coping: Goal: Level of anxiety will decrease Outcome: Not Progressing   Problem: Elimination: Goal: Will not experience complications related to bowel motility Outcome: Not Progressing Goal: Will not experience complications related to urinary retention Outcome: Not Progressing   Problem: Pain Managment: Goal: General experience of comfort will improve and/or be controlled Outcome: Not Progressing   Problem: Safety: Goal: Ability to remain free from injury will improve Outcome: Not Progressing   Problem: Skin Integrity: Goal: Risk for impaired skin integrity will decrease Outcome: Not Progressing   Problem: Education: Goal: Knowledge of the prescribed therapeutic regimen will improve Outcome: Not Progressing   Problem: Coping: Goal: Ability to identify and develop effective coping behavior will improve Outcome: Not Progressing   Problem: Clinical Measurements: Goal: Quality of life will improve Outcome: Not Progressing   Problem: Respiratory: Goal: Verbalizations of increased ease of respirations will increase Outcome: Not Progressing   Problem: Role Relationship: Goal: Family's ability to cope with current situation will improve Outcome: Not Progressing Goal: Ability to verbalize concerns, feelings, and thoughts to partner or family member will improve Outcome: Not Progressing   Problem: Pain Management: Goal: Satisfaction with pain management regimen will improve Outcome: Not Progressing

## 2024-05-26 NOTE — Progress Notes (Signed)
 NAME:  [name], MRN:  [id number], DOB:  1951-11-13, LOS: 3 ADMISSION DATE:  05/05/2024, CONSULTATION DATE:   REFERRING MD:  EDP, CHIEF COMPLAINT:  cardiac arrest   History of Present Illness:  73 y.o. F with PMH significant for COPD on 2L home O2, CHF, HTN, chronic pain who was found unresponsive in bed with her CPAP on by a family member per report. Unknown down time, but CPR started by EMS with ROSC after three rounds of ACLS.  Pt was unresponsive post-rosc and had severe seizure-like/myoclonic jerking in the ED.  CTH with  symmetric hypodensities involving the caudate nuclei bilaterally and CXR with R basilar disease. Labs were significant for creainine 1.26, lactic acid 13.8  Pertinent  Medical History   has a past medical history of Acute respiratory failure with hypoxia and hypercapnia (HCC) (07/19/2023), Anxiety, Atrophic vaginitis (10/09/2015), Baker's cyst, Blood in urine (10/09/2015), CHF (congestive heart failure) (HCC), COPD (chronic obstructive pulmonary disease) (HCC), Heart murmur, Hematuria (10/09/2015), Hypertension, Nicotine  addiction (04/07/2014), Oxygen  dependent, Pneumonia, Urinary retention (07/01/2020), Urinary urgency (01/25/2016), and Vaginal bleeding (10/09/2015).   Significant Hospital Events: Including procedures, antibiotic start and stop dates in addition to other pertinent events   5/12 admit to ICU post-arrest  Interim History / Subjective:  Seen this morning, remains intubated and sedated  Objective    Blood pressure (!) 135/57, pulse 75, temperature 98.6 F (37 C), resp. rate (!) 24, height 5\' 3"  (1.6 m), weight 86.5 kg, SpO2 95%.    Vent Mode: PRVC FiO2 (%):  [40 %] 40 % Set Rate:  [24 bmp] 24 bmp Vt Set:  [470 mL] 470 mL PEEP:  [5 cmH20-58 cmH20] 5 cmH20 Plateau Pressure:  [23 cmH20-26 cmH20] 25 cmH20   Intake/Output Summary (Last 24 hours) at  0914 Last data filed at  0600 Gross per 24 hour  Intake 1875.31 ml   Output 1750 ml  Net 125.31 ml   Filed Weights   05/08/24 0500  0421  0543  Weight: 84.7 kg 84.7 kg 86.5 kg    General:  critically ill-appearing F intubated and sedated HEENT: MM pink/moist, ETT in place Neuro: Not responsive to painful or vocal stimuli, pupils are blown CV: s1s2 rrr, no m/r/g PULM:  scattered rhonchi bilaterally  GI: soft, bsx4 active  Extremities: warm/dry, no edema     Resolved Hospital Problem list     Assessment & Plan:   Cerebellar Herniation  Unwitnessed out of hospital cardiac arrest  Acute on chronic hypoxic respiratory failure in setting of baseline COPD Shock, likely distributive vs cardiogenic  Family has been intermittently stopping by, and agree that if ventilation is stopped she will likely pass. She is currently being evaluated for organ transplant candidacy. Will defer to transplant team regarding her orders. Tentative plan is to go to the OR around 4PM for organ procurement.       Best Practice (right click and "Reselect all SmartList Selections" daily)   Diet/type: NPO DVT prophylaxis LMWH Pressure ulcer(s): N/A GI prophylaxis: H2B Lines: N/A Foley:  Yes, and it is still needed Code Status:  full code Last date of multidisciplinary goals of care discussion [Pt's sister updated 5/14]  Labs   CBC: Recent Labs  Lab 05/06/24 0006 05/06/24 0422 05/08/24 0532 05/08/24 0816 05/08/24 1000 05/08/24 1518 05/08/24 1529 05/08/24 2130 05/08/24 2142  0340  0421  WBC 9.3   < > 16.3*  --  17.0* 16.0*  --  15.8*  --   --  16.1*  NEUTROABS 5.6  --  13.2*  --   --   --   --   --   --   --   --   HGB 10.6*   < > 9.9*   < > 9.6* 9.7* 9.9* 9.5* 9.5* 9.9* 9.6*  HCT 39.1   < > 33.2*   < > 32.0* 32.6* 29.0* 31.1* 28.0* 29.0* 32.5*  MCV 108.9*   < > 97.4  --  97.9 97.9  --  96.9  --   --  97.3  PLT 154   < > 122*  --  117* 120*  --  118*  --   --  126*   < > = values in this interval not displayed.     Basic Metabolic Panel: Recent Labs  Lab 05/08/24 0532 05/08/24 0816 05/08/24 1000 05/08/24 1518 05/08/24 1529 05/08/24 2130 05/08/24 2142  0340  0421  NA 138   < > 138 140 138 142 142 144 145  K 4.0   < > 4.0 4.2 4.1 4.1 4.0 4.0 4.2  CL 96*  --  99 101  --  102  --   --  104  CO2 33*  --  31 33*  --  33*  --   --  32  GLUCOSE 97  --  98 98  --  105*  --   --  103*  BUN 20  --  21 21  --  21  --   --  19  CREATININE 0.85  --  0.82 0.83  --  0.79  --   --  0.81  CALCIUM  8.2*  --  8.0* 8.3*  --  8.4*  --   --  8.3*  MG 2.5*  --  2.2 2.2  --  2.3  --   --  2.4  PHOS 3.2  --  3.3 3.6  --  3.9  --   --  4.0   < > = values in this interval not displayed.   GFR: Estimated Creatinine Clearance: 67.3 mL/min (by C-G formula based on SCr of 0.81 mg/dL). Recent Labs  Lab 05/06/24 0005 05/06/24 0006 05/08/24 1000 05/08/24 1518 05/08/24 2130  0421  WBC  --    < > 17.0* 16.0* 15.8* 16.1*  LATICACIDVEN 13.8*  --   --   --   --   --    < > = values in this interval not displayed.    Liver Function Tests: Recent Labs  Lab 05/08/24 0532 05/08/24 1000 05/08/24 1518 05/08/24 2130  0421  AST 28 25 24 24 22   ALT 55* 47* 47* 44 44  ALKPHOS 66 64 67 67 79  BILITOT 0.6 0.5 0.7 0.7 0.7  PROT 5.6* 5.1* 5.3* 5.2* 5.3*  ALBUMIN 2.5* 2.2* 2.2* 2.1* 2.1*   Recent Labs  Lab 05/06/24 0006  LIPASE 28   No results for input(s): "AMMONIA" in the last 168 hours.  ABG    Component Value Date/Time   PHART 7.414  0340   PCO2ART 53.5 (H)  0340   PO2ART 88  0340   HCO3 34.4 (H)  0340   TCO2 36 (H)  0340   O2SAT 97  0340     Coagulation Profile: Recent Labs  Lab 05/08/24 0532 05/08/24 0914 05/08/24 1518 05/08/24 2130  0421  INR 1.1 1.1 1.1 1.2 1.2    Cardiac Enzymes: No results for input(s): "CKTOTAL", "CKMB", "CKMBINDEX", "TROPONINI" in the last 168  hours.  HbA1C: Hgb  A1c MFr Bld  Date/Time Value Ref Range Status  11/12/2023 04:36 PM 5.0 4.8 - 5.6 % Final    Comment:    (NOTE) Pre diabetes:          5.7%-6.4%  Diabetes:              >6.4%  Glycemic control for   <7.0% adults with diabetes   07/01/2022 02:07 AM 5.4 4.8 - 5.6 % Final    Comment:    (NOTE) Pre diabetes:          5.7%-6.4%  Diabetes:              >6.4%  Glycemic control for   <7.0% adults with diabetes     CBG: Recent Labs  Lab 05/08/24 1527 05/08/24 1937  0016  0438  0725  GLUCAP 100* 106* 100* 99 108*    Review of Systems:   Unable to obtain   Past Medical History:  She,  has a past medical history of Acute respiratory failure with hypoxia and hypercapnia (HCC) (07/19/2023), Anxiety, Atrophic vaginitis (10/09/2015), Baker's cyst, Blood in urine (10/09/2015), CHF (congestive heart failure) (HCC), COPD (chronic obstructive pulmonary disease) (HCC), Heart murmur, Hematuria (10/09/2015), Hypertension, Nicotine  addiction (04/07/2014), Oxygen  dependent, Pneumonia, Urinary retention (07/01/2020), Urinary urgency (01/25/2016), and Vaginal bleeding (10/09/2015).   Surgical History:   Past Surgical History:  Procedure Laterality Date   ABDOMINAL HYSTERECTOMY     CATARACT EXTRACTION Left 03/2015   CESAREAN SECTION     COLONOSCOPY  10/19/2004   Dr. Riley Cheadle- internal hemorrhoids, o/w normal rectum, normal colon   COLONOSCOPY N/A 07/30/2014   [id number] diverticulosis. Colonic polyp-removed TA. repeat TCS 07/2021   COLONOSCOPY WITH PROPOFOL  N/A 03/29/2021   diverticulosis in sigmoid and descending colon, one 5 mm polyp ascending colon (adenoma), non-bleeding internal hemorrhoids. 5 year surveillance   ESOPHAGOGASTRODUODENOSCOPY N/A 07/30/2014   [id number] EGD   POLYPECTOMY  03/29/2021   Procedure: POLYPECTOMY;  Surgeon: Suzette Espy, MD;  Location: [facility];  Service: Endoscopy;;   RADIAL HEAD ARTHROPLASTY Left 06/23/2017   Procedure: RADIAL HEAD  ARTHROPLASTY WITH LIGAMENT REPAIR;  Surgeon: Jasmine Mesi, MD;  Location: [facility] OR;  Service: Orthopedics;  Laterality: Left;     Social History:   reports that she quit smoking about 11 years ago. Her smoking use included cigarettes. She started smoking about 51 years ago. She has been exposed to tobacco smoke. She has never used smokeless tobacco. She reports current alcohol use. She reports that she does not use drugs.   Family History:  Her family history includes Blindness in her maternal grandfather; Cancer in her daughter, sister, sister, sister, and sister; Hypertension in her sister; Migraines in her daughter; Other in her brother, brother, sister, and sister. There is no history of Colon cancer. She was adopted.   Allergies Allergies  Allergen Reactions   Neurontin  [Gabapentin ] Other (See Comments)    Lightheadedness  Groggy      Home Medications  Prior to Admission medications   Medication Sig Start Date End Date Taking? Authorizing Provider  albuterol  (VENTOLIN  HFA) 108 (90 Base) MCG/ACT inhaler Inhale 2 puffs into the lungs every 4 (four) hours as needed for wheezing or shortness of breath. For shortness of breath 08/14/23   Diamond Formica, MD  ALPRAZolam  (XANAX ) 0.25 MG tablet Take 0.25 mg by mouth 2 (two) times daily as needed. 12/14/23   [provider]  aspirin  EC 81 MG tablet Take 81  mg by mouth daily.    [provider]  atorvastatin  (LIPITOR ) 80 MG tablet Take 1 tablet (80 mg total) by mouth daily. 07/07/22   Gonfa, Taye T, MD  bethanechol  (URECHOLINE ) 10 MG tablet Take 2 tablets (20 mg total) by mouth daily. 11/10/23   McKenzie, Arden Beck, MD  budesonide  (PULMICORT ) 0.25 MG/2ML nebulizer solution Take 2 mLs (0.25 mg total) by nebulization 2 (two) times daily. Patient taking differently: Take 0.25 mg by nebulization 2 (two) times daily as needed (for shortness of breath). 10/16/23 01/14/24  Unk Garb, DO  Fluticasone -Umeclidin-Vilant (TRELEGY  ELLIPTA) 100-62.5-25 MCG/ACT AEPB Inhale 1 puff into the lungs daily. 02/27/24   Diamond Formica, MD  furosemide  (LASIX ) 20 MG tablet Take 1 tablet (20 mg total) by mouth daily. 11/17/23   Haydee Lipa, MD  HYDROcodone -acetaminophen  (NORCO) 10-325 MG tablet Take 1 tablet by mouth every 6 (six) hours as needed for moderate pain (pain score 4-6). 1 tablet every 3-4 hours as needed for pain. 07/14/22   [provider]  ipratropium-albuterol  (DUONEB) 0.5-2.5 (3) MG/3ML SOLN Take 3 mLs by nebulization in the morning, at noon, in the evening, and at bedtime. Patient taking differently: Take 3 mLs by nebulization every 6 (six) hours as needed (For shortness of breath). 10/16/23 01/14/24  Unk Garb, DO  metoprolol  tartrate (LOPRESSOR ) 25 MG tablet Take 1 tablet (25 mg total) by mouth 2 (two) times daily. 11/20/23 02/18/24  Diamond Formica, MD  mirtazapine  (REMERON ) 7.5 MG tablet Take 7.5 mg by mouth at bedtime.    [provider]  Multiple Vitamins-Minerals (CENTRUM ADULT PO) Take 1 tablet by mouth daily.    [provider]  pantoprazole  (PROTONIX ) 20 MG tablet Take 1 tablet (20 mg total) by mouth daily. 07/08/22   Gonfa, Taye T, MD  tamsulosin  (FLOMAX ) 0.4 MG CAPS capsule Take 1 capsule (0.4 mg total) by mouth in the morning and at bedtime. 03/29/24   Marco Severs, MD     Critical care time: 60 minutes     CRITICAL CARE Performed by: Dotsie Gillette   Total critical care time: 60 minutes  Critical care time was exclusive of separately billable procedures and treating other patients.  Critical care was necessary to treat or prevent imminent or life-threatening deterioration.  Critical care was time spent personally by me on the following activities: development of treatment plan with patient and/or surrogate as well as nursing, discussions with consultants, evaluation of patient's response to treatment, examination of patient, obtaining history from patient or  surrogate, ordering and performing treatments and interventions, ordering and review of laboratory studies, ordering and review of radiographic studies, pulse oximetry and re-evaluation of patient's condition.    Dale Strausser, MD [facility] Internal Medicine Resident, PGY-2 See Amion for pager If no response to pager , please call [phone] until 7pm After 7:00 pm call Elink  [phone]

## 2024-05-26 NOTE — Death Summary Note (Addendum)
 DEATH SUMMARY   Patient Details  Name: [name] MRN: [id number] DOB: [birth year]  Admission/Discharge Information   Admit Date:  May 14, 2024  Date of Death: Date of Death: May 18, 2024  Time of Death: Time of Death: 05-21-1758  Length of Stay: 3  Referring Physician: Omie Bickers, MD   Reason(s) for Hospitalization  Patient was brought into the hospital following being found unresponsive in bed by family member, EMS activated achieved ROSC after 3 rounds of ACLS  Diagnoses  Preliminary cause of death:  Anoxic brain injury Cardiorespiratory arrest Secondary Diagnoses (including complications and co-morbidities):  Principal Problem:   Cardiac arrest (HCC) Diastolic heart failure Hypercholesterolemia Gold 4 chronic obstructive pulmonary disease Chronic respiratory failure Paroxysmal atrial fibrillation  Brief Hospital Course (including significant findings, care, treatment, and services provided and events leading to death)  [name] is a 73 y.o. year old female who brought to the hospital following being found unresponsive, had bystander CPR, EMS activated-downtime was unknown.  ROSC achieved but mental status did not improve.  Had  seizure-like/myoclonic jerking in the emergency department.  Intubated, neurology consulted  Initial CT did show changes consistent with anoxic injury-symmetric hypodensities involving the caudate nuclei bilaterally, repeat CT scan 8 hours later showed progression of findings with downward herniation of the cerebellum into the foreman magnum, developing hydrocephalus. Cerebral EEG suggestive of severe to profound diffuse encephalopathy  With CT showing herniation syndrome, goals of care discussions with family.  Continued hemodynamic support,  family did decide that they will come in and visit with the patient for further goals of care discussions.  Patient's condition continued to deteriorate and did herniate, with pupils becoming dilated and  fixed, a flat EEG.  Decision was made to transition to comfort measures only on 05/16/2024.  [facility] was contacted and with discussions with family-noted to be a candidate for organ donation  She was taken to the operating room on 05-18-24  Succumbed to her illness 1759 05-18-2024 Anoxic brain injury  Pertinent Labs and Studies  Significant Diagnostic Studies Overnight EEG with video Result Date: 05/12/2024 Arleene Lack, MD     04/25/2024  9:36 AM Patient Name: [name] MRN: [id number] Epilepsy Attending: Arleene Lack Referring Physician/Provider: Imogene Mana, NP Duration: 05/06/2024 0950 to 05/19/2024 0865  Patient history:  73 y.o. female found down in cardiac arrest with early myoclonic movements. EEG to evaluate for seizure  Level of alertness: comatose  AEDs during EEG study:  LEV, VPA, Propofol , Versed   Technical aspects: This EEG study was done with scalp electrodes positioned according to the 10-20 International system of electrode placement. Electrical activity was reviewed with band pass filter of 1-70Hz , sensitivity of 7 uV/mm, display speed of 4mm/sec with a 60Hz  notched filter applied as appropriate. EEG data were recorded continuously and digitally stored.  Video monitoring was available and reviewed as appropriate.  Description: EEG showed burst suppression pattern with bursts of 3-5 hz theta-delta slowing lasting 1-2 seconds alternating with  6-60 seconds of generalized EEG suppression. After around noon on 05/06/2024, EEG showed generalized suppression. Hyperventilation and photic stimulation were not performed.    ABNORMALITY - Burst suppression, generalized  IMPRESSION: This study was suggestive of severe to profound diffuse encephalopathy. After around noon on 05/06/2024, EEG worsened and was suggestive of profound diffuse encephalopathy. No definite seizures were noted.  Arleene Lack    ECHOCARDIOGRAM COMPLETE Result Date: 05/06/2024    ECHOCARDIOGRAM REPORT    Patient Name:   Carlette E  Espina Date of Exam: 05/06/2024 Medical Rec #:  [id number]        Height:       63.0 in Accession #:    [id number]       Weight:       182.5 lb Date of Birth:  [birth year]        BSA:          1.860 m Patient Age:    73 years         BP:           125/51 mmHg Patient Gender: F                HR:           61 bpm. Exam Location:  Inpatient Procedure: 2D Echo, Color Doppler and Cardiac Doppler (Both Spectral and Color            Flow Doppler were utilized during procedure). Indications:    Cardiac arrest  History:        Patient has prior history of Echocardiogram examinations, most                 recent 10/12/2023.  Sonographer:    Andrena Bang Referring Phys: [phone] LAURA R GLEASON IMPRESSIONS  1. Left ventricular ejection fraction, by estimation, is 40%. The left ventricle has mild to moderately decreased function. The left ventricle demonstrates global hypokinesis. Left ventricular diastolic parameters are consistent with Grade II diastolic dysfunction (pseudonormalization).  2. Right ventricular systolic function is mildly reduced. The right ventricular size is mildly enlarged. There is moderately elevated pulmonary artery systolic pressure. The estimated right ventricular systolic pressure is 45.5 mmHg.  3. The mitral valve is normal in structure. No evidence of mitral valve regurgitation. Moderate mitral annular calcification.  4. Tricuspid valve regurgitation is mild to moderate.  5. The aortic valve was not well visualized. Aortic valve regurgitation is not visualized. No aortic stenosis is present.  6. The inferior vena cava is dilated in size with <50% respiratory variability, suggesting right atrial pressure of 15 mmHg.  7. Technically difficult study with poor acoustic windows. FINDINGS  Left Ventricle: Left ventricular ejection fraction, by estimation, is 40%. The left ventricle has mild to moderately decreased function. The left ventricle demonstrates global hypokinesis. Definity  contrast agent was given IV to delineate the left ventricular endocardial borders. The left ventricular internal cavity size was normal in size. There is no left ventricular hypertrophy. Left ventricular diastolic parameters are consistent with Grade II diastolic dysfunction (pseudonormalization). Right Ventricle: The right ventricular size is mildly enlarged. Right vetricular wall thickness was not well visualized. Right ventricular systolic function is mildly reduced. There is moderately elevated pulmonary artery systolic pressure. The tricuspid  regurgitant velocity is 2.76 m/s, and with an assumed right atrial pressure of 15 mmHg, the estimated right ventricular systolic pressure is 45.5 mmHg. Left Atrium: Left atrial size was normal in size. Right Atrium: Right atrial size was not well visualized. Pericardium: There is no evidence of pericardial effusion. Mitral Valve: The mitral valve is normal in structure. Moderate mitral annular calcification. No evidence of mitral valve regurgitation. Tricuspid Valve: The tricuspid valve is not well visualized. Tricuspid valve regurgitation is mild to moderate. Aortic Valve: The aortic valve was not well visualized. Aortic valve regurgitation is not visualized. No aortic stenosis is present. Aortic valve mean gradient measures 3.0 mmHg. Aortic valve peak gradient measures 7.3 mmHg. Pulmonic Valve: The pulmonic valve was not well visualized. Pulmonic valve  regurgitation is not visualized. Aorta: Aortic root could not be assessed. Venous: The inferior vena cava is dilated in size with less than 50% respiratory variability, suggesting right atrial pressure of 15 mmHg. IAS/Shunts: The interatrial septum was not well visualized.   LV Volumes (MOD) LV vol d, MOD A4C: 186.0 ml Diastology LV vol s, MOD A4C: 112.0 ml LV e' medial:    3.48 cm/s LV SV MOD A4C:     186.0 ml LV E/e' medial:  22.1                             LV e' lateral:   3.70 cm/s                             LV E/e'  lateral: 20.8  RIGHT VENTRICLE RV S prime:     6.81 cm/s TAPSE (M-mode): 1.2 cm LEFT ATRIUM           Index LA Vol (A4C): 48.6 ml 26.13 ml/m  AORTIC VALVE AV Vmax:           135.00 cm/s AV Vmean:          77.600 cm/s AV VTI:            0.307 m AV Peak Grad:      7.3 mmHg AV Mean Grad:      3.0 mmHg LVOT Vmax:         78.20 cm/s LVOT Vmean:        37.300 cm/s LVOT VTI:          0.135 m LVOT/AV VTI ratio: 0.44 MITRAL VALVE               TRICUSPID VALVE MV Area (PHT): 2.56 cm    TR Peak grad:   30.5 mmHg MV Decel Time: 296 msec    TR Vmax:        276.00 cm/s MV E velocity: 77.00 cm/s MV A velocity: 72.80 cm/s  SHUNTS MV E/A ratio:  1.06        Systemic VTI: 0.14 m Dalton McleanMD Electronically signed by Archer Bear Signature Date/Time: 05/06/2024/7:08:19 PM    Final    CT HEAD WO CONTRAST ( ) Result Date: 05/06/2024 CLINICAL DATA:  Anoxic brain injury. EXAM: CT HEAD WITHOUT CONTRAST TECHNIQUE: Contiguous axial images were obtained from the base of the skull through the vertex without intravenous contrast. RADIATION DOSE REDUCTION: This exam was performed according to the departmental dose-optimization program which includes automated exposure control, adjustment of the mA and/or kV according to patient size and/or use of iterative reconstruction technique. COMPARISON:  CT head without contrast 05/06/2024 at 3:10 a.m. FINDINGS: Brain: Marked brain edema and global mass effect has progressed. Progressive effacement of the sulci over the convexities is noted bilaterally. Hypoxic ischemic injury the caudate head is again noted bilaterally. Downward herniation the cerebellum into the foramen magnum significant mass effect in the posterior fossa is noted. The basal cisterns are effaced. The fourth ventricle diminished in size. Hydrocephalus is developing the lateral third ventricles with moderate prominence of the temporal tips. Increased prominence of the tentorium and cortex gives the appearance of hemorrhage,  so-called pseudo subarachnoid hemorrhage. No acute hemorrhage is present. Focal ischemic changes are present. Vascular: Atherosclerotic changes are present at the cavernous internal carotid arteries bilaterally. No hyperdense vessel is present. Skull: Calvarium is intact. No focal lytic or blastic lesions  are present. No significant extracranial soft tissue lesion is present. Sinuses/Orbits: A fluid level is present in the right maxillary sinus. Is present nasopharynx. Partial opacification of posterior ethmoid air cells is noted bilaterally. Minimal fluid is present a right sphenoid sinus. Mastoid air cells are clear. A left lens replacement is noted. Globes and orbits are otherwise within normal limits. IMPRESSION: 1. Marked brain edema and global mass effect has progressed. 2. Downward herniation of the cerebellum into the foramen magnum. 3. Developing hydrocephalus of the lateral and third ventricles with moderate prominence of the temporal tips. 4. Increased prominence of the tentorium and cortex gives the appearance of hemorrhage, so-called pseudo subarachnoid hemorrhage. 5. Hypoxic ischemic injury of the caudate head bilaterally is again noted. The above was relayed via text pager to Dr. Christiane Cowing on 05/06/2024 at 12:07 . Electronically Signed   By: Audree Leas M.D.   On: 05/06/2024 12:07   EEG adult Result Date: 05/06/2024 Arleene Lack, MD     05/06/2024  9:58 AM Patient Name: [name] MRN: [id number] Epilepsy Attending: Arleene Lack Referring Physician/Provider: Gleason, Patt Boozer, PA-C Date: 05/06/2024 Duration: 24.20 mins Patient history:  73 y.o. female found down in cardiac arrest with early myoclonic movements. EEG to evaluate for seizure  Level of alertness: comatose AEDs during EEG study:  LEV, VPA, Propofol , Versed  Technical aspects: This EEG study was done with scalp electrodes positioned according to the 10-20 International system of electrode placement. Electrical activity was  reviewed with band pass filter of 1-70Hz , sensitivity of 7 uV/mm, display speed of 35mm/sec with a 60Hz  notched filter applied as appropriate. EEG data were recorded continuously and digitally stored.  Video monitoring was available and reviewed as appropriate. Description: EEG showed burst suppression pattern with bursts of 3-5 hz theta-delta slowing lasting 1-2 seconds alternating with  6-60 seconds of generalized EEG suppression. Hyperventilation and photic stimulation were not performed.   ABNORMALITY - Burst suppression, generalized  IMPRESSION: This study was suggestive of severe to profound diffuse encephalopathy.  No definite seizures were noted.  Arleene Lack   DG CHEST PORT 1 VIEW Result Date: 05/06/2024 CLINICAL DATA:  Central line placement. EXAM: PORTABLE CHEST 1 VIEW COMPARISON:  05/06/2024, earlier same day FINDINGS: Endotracheal tube tip is approximately 7.2 cm above the base of the carina. The NG tube passes into the stomach although the distal tip position is not included on the film. Left IJ central line tip overlies the proximal to mid SVC. No left pneumothorax. Lungs are hyperexpanded. Pleuroparenchymal opacity in the right base is stable. Cardiopericardial silhouette is at upper limits of normal for size. Telemetry leads overlie the chest. IMPRESSION: 1. Left IJ central line tip overlies the proximal to mid SVC. No pneumothorax. 2. Endotracheal tube tip is approximately 7.2 cm above the base of the carina. 3. Stable pleuroparenchymal opacity in the right base. Electronically Signed   By: Donnal Fusi M.D.   On: 05/06/2024 07:09   CT CHEST ABDOMEN PELVIS W CONTRAST Result Date: 05/06/2024 CLINICAL DATA:  Mental status changes.  Sepsis. EXAM: CT CHEST, ABDOMEN, AND PELVIS WITH CONTRAST TECHNIQUE: Multidetector CT imaging of the chest, abdomen and pelvis was performed following the standard protocol during bolus administration of intravenous contrast. RADIATION DOSE REDUCTION: This exam  was performed according to the departmental dose-optimization program which includes automated exposure control, adjustment of the mA and/or kV according to patient size and/or use of iterative reconstruction technique. CONTRAST:  75mL OMNIPAQUE  IOHEXOL  350 MG/ML SOLN COMPARISON:  06/30/2023 FINDINGS: CT CHEST FINDINGS Cardiovascular: The heart is enlarged. No substantial pericardial effusion. Enlargement of the pulmonary outflow tract/main pulmonary arteries suggests pulmonary arterial hypertension. Endotracheal tube tip is approximately 6.6 cm above the base of the carina. NG tube is visualized in the esophagus. Mediastinum/Nodes: No mediastinal lymphadenopathy. There is no hilar lymphadenopathy. The esophagus has normal imaging features. There is no axillary lymphadenopathy. Lungs/Pleura: Centrilobular and paraseptal emphysema evident. Asymmetric right greater than left biapical pleuroparenchymal scarring is similar to prior. Volume loss with bronchiectasis and scarring in the posterior right base is also similar with associated consolidative opacity, slightly progressive in the interval. 13 mm mixed attenuation nodule in the left apex on 31/4 is new in the interval. 7 mm right upper lobe nodule identified on 52/4. 8 mm left lower lobe nodule identified on 74/4. 7 mm lingular nodule identified on 109/4. 5 mm left lower lobe nodule on 128/4 is stable. Small focus of subpleural nodular consolidative airspace opacity is seen in the left lower lobe on 01/14/4. Calcified pleural plaques are again seen in the right hemithorax with volume loss in the right chest. Musculoskeletal: No worrisome lytic or sclerotic osseous abnormality. CT ABDOMEN PELVIS FINDINGS Hepatobiliary: No suspicious focal abnormality within the liver parenchyma. There is no evidence for gallstones, gallbladder wall thickening, or pericholecystic fluid. No intrahepatic or extrahepatic biliary dilation. Pancreas: No focal mass lesion. No dilatation of  the main duct. No intraparenchymal cyst. No peripancreatic edema. Spleen: No splenomegaly. No suspicious focal mass lesion. Adrenals/Urinary Tract: Similar appearance of mild bilateral adrenal gland thickening. Cortical scarring is noted in both kidneys without hydronephrosis. No evidence for hydroureter. Bladder is decompressed by Foley catheter. Stomach/Bowel: Stomach is distended with fluid despite the presence of the NG tube tip in the mid stomach. Proximal side port of the NG tube appears to be positioned in the distal esophagus. Duodenum is normally positioned as is the ligament of Treitz. No small bowel wall thickening. No small bowel dilatation. The terminal ileum is normal. The appendix is normal. No gross colonic mass. No colonic wall thickening. Diverticular changes are noted in the left colon without evidence of diverticulitis. Vascular/Lymphatic: There is moderate atherosclerotic calcification of the abdominal aorta without aneurysm. There is no gastrohepatic or hepatoduodenal ligament lymphadenopathy. No retroperitoneal or mesenteric lymphadenopathy. No pelvic sidewall lymphadenopathy. Reproductive: Hysterectomy.  There is no adnexal mass. Other: No intraperitoneal free fluid. Musculoskeletal: No worrisome lytic or sclerotic osseous abnormality. IMPRESSION: 1. Stomach is distended with fluid despite the presence of the NG tube tip in the mid stomach. Proximal side port of the NG tube appears to be positioned in the distal esophagus. 2. Volume loss with bronchiectasis and scarring in the posterior right base is similar to prior with associated consolidative opacity, slightly progressive in the interval. Imaging features are compatible with chronic scarring although component of superimposed infection/inflammation not excluded. 3. Scattered bilateral pulmonary nodules, potentially infectious/inflammatory although follow-up warranted to exclude neoplasm. Repeat CT chest in 3 months indicated. 4.  Enlargement of the pulmonary outflow tract/main pulmonary arteries suggests pulmonary arterial hypertension. 5. Left colonic diverticulosis without diverticulitis. 6. Aortic Atherosclerosis (ICD10-I70.0) and Emphysema (ICD10-J43.9). Electronically Signed   By: Donnal Fusi M.D.   On: 05/06/2024 05:19   CT Head Wo Contrast Result Date: 05/06/2024 CLINICAL DATA:  Initial evaluation for mental status change, unknown cause. EXAM: CT HEAD WITHOUT CONTRAST TECHNIQUE: Contiguous axial images were obtained from the base of the skull through the vertex without intravenous contrast. RADIATION DOSE REDUCTION: This exam was  performed according to the departmental dose-optimization program which includes automated exposure control, adjustment of the mA and/or kV according to patient size and/or use of iterative reconstruction technique. COMPARISON:  Prior study from 07/12/2023. FINDINGS: Brain: Cerebral volume within normal limits. Few scattered patchy hypodensities noted involving the supratentorial cerebral white matter, nonspecific, but most likely chronic microvascular ischemic disease. There is question of symmetric hypodensity involving the caudate nuclei bilaterally (series 3, image 15). Remainder of the deep gray nuclei appear maintained. No other acute large vessel territory infarct. No acute intracranial hemorrhage. No mass lesion or midline shift. No hydrocephalus or extra-axial fluid collection. Vascular: No abnormal hyperdense vessel. Skull: Scalp soft tissues demonstrate no acute finding. Calvarium intact. Sinuses/Orbits: Globes and orbital soft tissues within normal limits. Moderate mucosal thickening with a few scattered air-fluid levels present about the paranasal sinuses. Mastoid air cells are clear. Patient is intubated. Other: None. IMPRESSION: 1. Question symmetric hypodensities involving the caudate nuclei bilaterally. While this finding could potentially be artifactual in nature, follow-up examination  with dedicated brain MRI is recommended for further evaluation. Primary differential considerations include changes of toxic metabolic derangement or hypoxic ischemic injury. 2. No other acute intracranial abnormality. Electronically Signed   By: Virgia Griffins M.D.   On: 05/06/2024 03:10   DG Chest Portable 1 View Result Date: 05/06/2024 CLINICAL DATA:  Shortness of breath. EXAM: PORTABLE CHEST 1 VIEW COMPARISON:  November 13, 2023 FINDINGS: An endotracheal tube is seen with its distal tip approximately 8.4 cm from the carina. And enteric tube is in place. Its distal tip is not visualized. The distal side hole sits just above the expected level of the gastroesophageal junction. The cardiac silhouette is mildly enlarged and unchanged in size. Stable moderate severity airspace disease is seen within the right lung base. There is a small right pleural effusion. No pneumothorax is identified. The visualized skeletal structures are unremarkable. IMPRESSION: 1. Endotracheal tube and enteric tube positioning, as described above. Advancement of the enteric tube by approximately 5 cm is recommended to decrease the risk of aspiration. 2. Stable moderate severity right basilar airspace disease. Correlation with nonemergent chest CT is recommended to further exclude the presence of an underlying neoplastic process. 3. Small right pleural effusion. Electronically Signed   By: Virgle Grime M.D.   On: 05/06/2024 01:45    Microbiology Recent Results (from the past 240 hours)  Culture, blood (routine x 2)     Status: None (Preliminary result)   Collection Time: 05/05/24 11:50 PM   Specimen: BLOOD  Result Value Ref Range Status   Specimen Description BLOOD RIGHT ANTECUBITAL  Final   Special Requests   Final    BOTTLES DRAWN AEROBIC AND ANAEROBIC Blood Culture adequate volume   Culture   Final    NO GROWTH 4 DAYS Performed at [facility], [address], [place], [place] [address]    Report Status  PENDING  Incomplete  Culture, blood (routine x 2)     Status: None (Preliminary result)   Collection Time: 05/06/24 12:37 AM   Specimen: BLOOD  Result Value Ref Range Status   Specimen Description BLOOD LEFT FOREARM  Final   Special Requests   Final    BOTTLES DRAWN AEROBIC ONLY Blood Culture adequate volume   Culture   Final    NO GROWTH 4 DAYS Performed at [facility], [address], [place], [place] [address]    Report Status PENDING  Incomplete  MRSA Next Gen by PCR,  Nasal     Status: None   Collection Time: 05/06/24  3:01 AM   Specimen: Nasal Mucosa; Nasal Swab  Result Value Ref Range Status   MRSA by PCR Next Gen NOT DETECTED NOT DETECTED Final    Comment: (NOTE) The GeneXpert MRSA Assay (FDA approved for NASAL specimens only), is one component of a comprehensive MRSA colonization surveillance program. It is not intended to diagnose MRSA infection nor to guide or monitor treatment for MRSA infections. Test performance is not FDA approved in patients less than 43 years old. Performed at [facility], [address], [place], [place] [address]   Culture, Respiratory w Gram Stain     Status: None   Collection Time: 05/06/24  5:20 AM   Specimen: Tracheal Aspirate; Respiratory  Result Value Ref Range Status   Specimen Description TRACHEAL ASPIRATE  Final   Special Requests NONE  Final   Gram Stain NO WBC SEEN RARE BUDDING YEAST SEEN   Final   Culture   Final    RARE Normal respiratory flora-no Staph aureus or Pseudomonas seen Performed at [facility], [address], [place], [place] [address]    Report Status 05/08/2024 FINAL  Final  Resp panel by RT-PCR (RSV, Flu A&B, Covid) Urine, Catheterized     Status: None   Collection Time: 05/17/2024  9:29 PM   Specimen: Urine, Catheterized; Nasal Swab  Result Value Ref Range Status   SARS Coronavirus 2 by RT PCR NEGATIVE NEGATIVE Final   Influenza A by PCR NEGATIVE NEGATIVE Final   Influenza B by PCR  NEGATIVE NEGATIVE Final    Comment: (NOTE) The Xpert Xpress SARS-CoV-2/FLU/RSV plus assay is intended as an aid in the diagnosis of influenza from Nasopharyngeal swab specimens and should not be used as a sole basis for treatment. Nasal washings and aspirates are unacceptable for Xpert Xpress SARS-CoV-2/FLU/RSV testing.  Fact Sheet for Patients: BloggerCourse.com  Fact Sheet for Healthcare Providers: SeriousBroker.it  This test is not yet approved or cleared by the [place]  FDA and has been authorized for detection and/or diagnosis of SARS-CoV-2 by FDA under an Emergency Use Authorization (EUA). This EUA will remain in effect (meaning this test can be used) for the duration of the COVID-19 declaration under Section 564(b)(1) of the Act, 21 U.S.C. section 360bbb-3(b)(1), unless the authorization is terminated or revoked.     Resp Syncytial Virus by PCR NEGATIVE NEGATIVE Final    Comment: (NOTE) Fact Sheet for Patients: BloggerCourse.com  Fact Sheet for Healthcare Providers: SeriousBroker.it  This test is not yet approved or cleared by the [place]  FDA and has been authorized for detection and/or diagnosis of SARS-CoV-2 by FDA under an Emergency Use Authorization (EUA). This EUA will remain in effect (meaning this test can be used) for the duration of the COVID-19 declaration under Section 564(b)(1) of the Act, 21 U.S.C. section 360bbb-3(b)(1), unless the authorization is terminated or revoked.  Performed at [facility], [address] [place], [place] [address]   Urine Culture (for pregnant, neutropenic or urologic patients or patients with an indwelling urinary catheter)     Status: None   Collection Time: 04/25/2024  9:29 PM   Specimen: Urine, Catheterized  Result Value Ref Range Status   Specimen Description URINE, CATHETERIZED  Final   Special Requests  NONE  Final   Culture   Final    NO GROWTH Performed at [facility], [address], [place], [place] [address]  Report Status 05/08/2024 FINAL  Final    Lab Basic Metabolic Panel: Recent Labs  Lab 05/08/24 1518 05/08/24 1529 05/08/24 2130 05/08/24 2142  0421  0927  0934  1512  1514  NA 140   < > 142   < > 145 147* 145 148* 148*  K 4.2   < > 4.1   < > 4.2 4.2 4.2 4.1 3.9  CL 101  --  102  --  104 106  --  107  --   CO2 33*  --  33*  --  32 30  --  33*  --   GLUCOSE 98  --  105*  --  103* 110*  --  115*  --   BUN 21  --  21  --  19 20  --  19  --   CREATININE 0.83  --  0.79  --  0.81 0.87  --  0.86  --   CALCIUM  8.3*  --  8.4*  --  8.3* 8.5*  --  8.6*  --   MG 2.2  --  2.3  --  2.4 2.4  --  2.6*  --   PHOS 3.6  --  3.9  --  4.0 4.3  --  4.2  --    < > = values in this interval not displayed.   Liver Function Tests: Recent Labs  Lab 05/08/24 1518 05/08/24 2130  0421  0927  1512  AST 24 24 22 21 21   ALT 47* 44 44 40 38  ALKPHOS 67 67 79 83 91  BILITOT 0.7 0.7 0.7 0.6 0.6  PROT 5.3* 5.2* 5.3* 5.4* 5.4*  ALBUMIN 2.2* 2.1* 2.1* 2.1* 2.1*   Recent Labs  Lab 05/06/24 0006  LIPASE 28   No results for input(s): "AMMONIA" in the last 168 hours. CBC: Recent Labs  Lab 05/06/24 0006 05/06/24 0422 05/08/24 0532 05/08/24 0816 05/08/24 1518 05/08/24 1529 05/08/24 2130 05/08/24 2142  0421  0927  0934  1512  1514  WBC 9.3   < > 16.3*   < > 16.0*  --  15.8*  --  16.1* 17.0*  --  16.6*  --   NEUTROABS 5.6  --  13.2*  --   --   --   --   --   --   --   --   --   --   HGB 10.6*   < > 9.9*   < > 9.7*   < > 9.5*   < > 9.6* 9.6* 9.9* 9.6* 9.5*  HCT 39.1   < > 33.2*   < > 32.6*   < > 31.1*   < > 32.5* 32.0* 29.0* 32.3* 28.0*  MCV 108.9*   < > 97.4   < > 97.9  --  96.9  --  97.3 97.0  --  97.3  --   PLT 154   < > 122*   < > 120*  --  118*  --  126* 126*  --   135*  --    < > = values in this interval not displayed.   Cardiac Enzymes: No results for input(s): "CKTOTAL", "CKMB", "CKMBINDEX", "TROPONINI" in the last 168 hours. Sepsis Labs: Recent Labs  Lab 05/06/24 0005 05/06/24 0006 05/08/24 2130  0421  0927  1512  WBC  --    < > 15.8* 16.1* 17.0* 16.6*  LATICACIDVEN 13.8*  --   --   --   --   --    < > =  values in this interval not displayed.    Procedures/Operations  Endotracheal intubation 05/06/2024   [place] Nunley A Kaitlynne Wenz 05/10/2024, 1:06 PM

## 2024-05-26 NOTE — Death Summary Note (Incomplete)
  Name: IRMALINDA RADERMACHER MRN: [id number] DOB: 1951-06-05 73 y.o.  Date of Admission: 05/05/2024 11:53 PM Date of Discharge: 04/25/2024 Attending Physician: Margaretann Sharper, MD  Discharge Diagnosis: Principal Problem:   Cardiac arrest [facility])   Cause of death: Cardiac Arrest Time of death: ***  Disposition and follow-up:   Ms.Zuleyka E Beilke was discharged from [facility] in expired condition.    Hospital Course: Ms. Reed Simmerman is a 73 year old female with past medical history of COPD on 2L at home, CHF, HTN, and OSA on CPAP who was found unresponsive in her bed with her CPAP on by a family member. Unclear how long she was down for, but CPR was started by EMS and ROSC was achieved after three rounds of ACLS. Pt was unresponsive post-rosc and had severe seizure like/myoclonic jerking in the ED. Initial CTH showed symmetric hypodensities involving the caudate nuclei bilaterally and chest xray with R basilar disease, consistent with hypoxic brain disease. Repeat CT Head showed brain edema and global mass affect that had progressed, with downward herniation of the cerebellum into the foramen magnum. Clinically, her pupils were dilated consistent with brain herniation. She was intubated for airway protection. After discussions with family, decision was made to pursue comfort measures, and pt passed peacefully at -   Signed: Dehlia Kilner, MD 05/23/2024, 10:39 AM

## 2024-05-26 NOTE — Progress Notes (Addendum)
 Orders placed for 30,000 units of heparin   Morphine drip Versed  drip  Orders to be initiated in the process of organ recovery

## 2024-05-26 NOTE — Progress Notes (Signed)
 TOD called in OR at 1759 by this RN and Arlys Berke, RN. [place] was present.

## 2024-05-26 DEATH — deceased

## 2024-05-29 ENCOUNTER — Ambulatory Visit: Admitting: Internal Medicine
